# Patient Record
Sex: Female | Born: 1950
Health system: Southern US, Community
[De-identification: ages and names within clinical notes are randomized; demographics above are authoritative.]

## PROBLEM LIST (undated history)

## (undated) DIAGNOSIS — J189 Pneumonia, unspecified organism: Secondary | ICD-10-CM

## (undated) DIAGNOSIS — E876 Hypokalemia: Secondary | ICD-10-CM

## (undated) DIAGNOSIS — I480 Paroxysmal atrial fibrillation: Secondary | ICD-10-CM

## (undated) DIAGNOSIS — R001 Bradycardia, unspecified: Secondary | ICD-10-CM

## (undated) DIAGNOSIS — G4730 Sleep apnea, unspecified: Secondary | ICD-10-CM

## (undated) DIAGNOSIS — I959 Hypotension, unspecified: Secondary | ICD-10-CM

## (undated) DIAGNOSIS — G44009 Cluster headache syndrome, unspecified, not intractable: Secondary | ICD-10-CM

## (undated) DIAGNOSIS — I5022 Chronic systolic (congestive) heart failure: Secondary | ICD-10-CM

## (undated) HISTORY — DX: Paroxysmal atrial fibrillation: I48.0

## (undated) HISTORY — PX: TUBAL LIGATION: SHX77

## (undated) HISTORY — PX: PACEMAKER INSERTION: SHX728

## (undated) HISTORY — DX: Hypokalemia: E87.6

## (undated) HISTORY — PX: TONSILLECTOMY: SUR1361

---

## 2006-01-08 DIAGNOSIS — J189 Pneumonia, unspecified organism: Secondary | ICD-10-CM

## 2006-01-08 HISTORY — DX: Pneumonia, unspecified organism: J18.9

## 2009-12-28 ENCOUNTER — Inpatient Hospital Stay (HOSPITAL_COMMUNITY)
Admission: EM | Admit: 2009-12-28 | Discharge: 2009-12-30 | Payer: Self-pay | Source: Home / Self Care | Attending: Internal Medicine | Admitting: Internal Medicine

## 2009-12-29 ENCOUNTER — Encounter (INDEPENDENT_AMBULATORY_CARE_PROVIDER_SITE_OTHER): Payer: Self-pay | Admitting: Internal Medicine

## 2010-01-03 ENCOUNTER — Ambulatory Visit
Admission: RE | Admit: 2010-01-03 | Payer: Self-pay | Source: Home / Self Care | Attending: Internal Medicine | Admitting: Internal Medicine

## 2010-01-20 DIAGNOSIS — I428 Other cardiomyopathies: Secondary | ICD-10-CM | POA: Insufficient documentation

## 2010-01-20 DIAGNOSIS — I5023 Acute on chronic systolic (congestive) heart failure: Secondary | ICD-10-CM | POA: Insufficient documentation

## 2010-01-20 DIAGNOSIS — I10 Essential (primary) hypertension: Secondary | ICD-10-CM | POA: Insufficient documentation

## 2010-01-20 DIAGNOSIS — E876 Hypokalemia: Secondary | ICD-10-CM | POA: Insufficient documentation

## 2010-01-24 ENCOUNTER — Encounter: Payer: Self-pay | Admitting: Internal Medicine

## 2010-01-24 ENCOUNTER — Ambulatory Visit
Admission: RE | Admit: 2010-01-24 | Discharge: 2010-01-24 | Payer: Self-pay | Source: Home / Self Care | Attending: Internal Medicine | Admitting: Internal Medicine

## 2010-02-15 NOTE — Assessment & Plan Note (Signed)
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 She has a h/o HOCM s/p PPM in the 39s at [facility]. PMHx also notable for HTN and ongoing tobacco use.   Was admitted in December with acute HF. Echo with EF 30-35%, There was also evidence of ASH with septum of 1.7 PM = 1.9. Diuresed and scheduled for outpt cath.  Cath 2 weeks ago: Normal cornaries. EF 40-45%.  Central aortic pressure was 86/47 with a mean of 62.  LV pressure is 74/13 with an EDP of 2.  Right atrial pressure mean of 3.  RV pressure 26/3 with an EDP of 5.  PA pressure 28/5 with a mean of 16.  Pulmonary capillary wedge pressure mean of 7.  Fick cardiac output was 3.4 and cardiac index 2.1. Lasix stopped and spironolactone continued.   Returns for post cath f/u. feels she is getting somewhat better. Able to walk on TM for 30-63min at 1.5-1. however she gets markedly SOB with any incline or steps. Also has squeezing sensation in her chest. No palpitation, syncope or presyncope. Watching salt very closely. Weight now down 13 pounds and stable.   Wearing nicotine patch. Cut cigareetes down to 3/day.   Current Medications (verified): 1)  Aspirin 81 Mg Tbec (Aspirin) .... Take One Tablet By Mouth Daily 2)  Carvedilol 12.5 Mg Tabs (Carvedilol) .... Take One Tablet By Mouth Twice A Day 3)  Lisinopril 10 Mg Tabs (Lisinopril) .... Take One Tablet By Mouth Daily 4)  Spironolactone 25 Mg Tabs (Spironolactone) .... Take 1/2  Tablet By Mouth Daily 5)  Furosemide 40 Mg Tabs (Furosemide) .... Take One Tablet By Mouth Daily.  Allergies (verified): No Known Drug Allergies  Past History:  Past Medical History: Last updated: 01/20/2010 1. hypertrophic obstructive cardiomyopathy 2. Hypokalemia 3. Congestive Heart Failure 4. Hypertension  Vital Signs:  Patient profile:   60 year old  female Height:      62 inches Weight:      139 pounds BMI:     25.52 Pulse rate:   71 / minute BP sitting:   108 / 72  (left arm)  Vitals Entered By: Laurance Flatten CMA (January 24, 2010 12:14 PM)  Physical Exam  General:  Gen: well appearing. no resp difficulty HEENT: normal Neck: supple. no JVD. Carotids 2+ bilat; no bruits. No lymphadenopathy or thryomegaly appreciated. Cor: PMI nondisplaced. Regular rate & rhythm. No rubs, gallops. 2/6 systolic murmur rsb. Lungs: clear Abdomen: soft, nontender, nondistended. No hepatosplenomegaly. No bruits or masses. Good bowel sounds. Extremities: no cyanosis, clubbing, rash, edema Neuro: alert & orientedx3, cranial nerves grossly intact. moves all 4 extremities w/o difficulty. affect pleasant    PPM Specifications Referring MD:  Truman Hayward PPM Vendor:  Medtronic     PPM Model Number:  [id number]     PPM Serial Number:  [id number] H PPM DOI:  09/23/2000     PPM Implanting MD:  NOT IMPLANTED BY [place]  Lead 1    Location: RA     DOI: 07/12/1992     Model #: 4524     Serial #: [id number]     Status: active Lead 2    Location: RV     DOI: 07/12/1992     Model #: 4024     Serial #: [id number]     Status: active  Magnet Response  Rate:  BOL 85 EIR  65  Indications:  HOCM   PPM Follow Up Remote Check?  No Battery Voltage:  2.75 V     Battery Est. Longevity:  2.5 years     Pacer Dependent:  No       PPM Device Measurements Atrium  Amplitude: 2.0 mV, Impedance: 664 ohms, Threshold: 0.5 V at 0.4 msec Right Ventricle  Amplitude: 8.0 mV, Impedance: 552 ohms, Threshold: 1.0 V at 0.4 msec  Episodes MS Episodes:  0     Coumadin:  No Ventricular High Rate:  0     Atrial Pacing:  4.5%     Ventricular Pacing:  99.9%  Parameters Mode:  DDDR     Lower Rate Limit:  55     Upper Rate Limit:  140 Paced AV Delay:  150     Sensed AV Delay:  120 Tech Comments:  New patient to establish.  ROV 6 months. Altha Harm, LPN  January 25, 2010 7:34 AM    Impression & Recommendations:  Problem # 1:  SYSTOLIC HEART FAILURE, ACUTE ON CHRONIC (ICD-428.23) Improving. NYHA II-III. Volume status much improved. Will increase coreg to 18.75 bid. Schedule cpx and echo in 1 month.   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 Does not have any high-risk features for SCD (2600 pvcs on pacer but no NSVT). Thus no inication for icd at this point. Await results of CPX. May consider genetic testing at some point. Continue b-blocker.   Problem # 3:  Tobacco use COunseled on need to quit.  Other Orders: EKG w/ Interpretation (93000)  Patient Instructions: 1)  Your physician recommends that you schedule a follow-up appointment in: 1 month with Dr. Gala Romney 2)  Your physician has recommended you make the following change in your medication: Increase Coreg ( Carvedilol) Prescriptions: CARVEDILOL 12.5 MG TABS (CARVEDILOL) Take one and 1/2 tablets  by mouth twice a day  #90 x 6   Entered by:   Lisabeth Devoid RN   Authorized by:   Dolores Patty, MD, [facility]   Signed by:   Lisabeth Devoid RN on 01/24/2010   Method used:   Electronically to        [facility]* (retail)       [address]       [address] Shipping/mailing       [place], [place]  [address]       Ph: [phone]       Fax: [phone]   RxID:   [id number]

## 2010-02-23 ENCOUNTER — Encounter: Payer: Self-pay | Admitting: Internal Medicine

## 2010-03-01 ENCOUNTER — Emergency Department (HOSPITAL_COMMUNITY): Payer: 59

## 2010-03-01 ENCOUNTER — Emergency Department (HOSPITAL_COMMUNITY)
Admission: EM | Admit: 2010-03-01 | Discharge: 2010-03-01 | Disposition: A | Discharge status: Home / Self Care | Payer: 59 | Attending: Emergency Medicine | Admitting: Emergency Medicine

## 2010-03-01 DIAGNOSIS — R42 Dizziness and giddiness: Secondary | ICD-10-CM | POA: Insufficient documentation

## 2010-03-01 DIAGNOSIS — R5383 Other fatigue: Secondary | ICD-10-CM | POA: Insufficient documentation

## 2010-03-01 DIAGNOSIS — R5381 Other malaise: Secondary | ICD-10-CM | POA: Insufficient documentation

## 2010-03-01 DIAGNOSIS — R61 Generalized hyperhidrosis: Secondary | ICD-10-CM | POA: Insufficient documentation

## 2010-03-01 DIAGNOSIS — J45909 Unspecified asthma, uncomplicated: Secondary | ICD-10-CM | POA: Insufficient documentation

## 2010-03-01 DIAGNOSIS — R109 Unspecified abdominal pain: Secondary | ICD-10-CM | POA: Insufficient documentation

## 2010-03-01 DIAGNOSIS — R110 Nausea: Secondary | ICD-10-CM | POA: Insufficient documentation

## 2010-03-01 DIAGNOSIS — I517 Cardiomegaly: Secondary | ICD-10-CM | POA: Insufficient documentation

## 2010-03-01 DIAGNOSIS — I10 Essential (primary) hypertension: Secondary | ICD-10-CM | POA: Insufficient documentation

## 2010-03-01 LAB — BASIC METABOLIC PANEL
BUN: 19 mg/dL (ref 6–23)
CO2: 24 mEq/L (ref 19–32)
Glucose, Bld: 144 mg/dL — ABNORMAL HIGH (ref 70–99)
Potassium: 4.7 mEq/L (ref 3.5–5.1)
Sodium: 135 mEq/L (ref 135–145)

## 2010-03-01 LAB — CBC
HCT: 35.9 % — ABNORMAL LOW (ref 36.0–46.0)
Hemoglobin: 12.3 g/dL (ref 12.0–15.0)
MCHC: 34.3 g/dL (ref 30.0–36.0)
MCV: 94 fL (ref 78.0–100.0)

## 2010-03-01 LAB — DIFFERENTIAL
Basophils Absolute: 0 10*3/uL (ref 0.0–0.1)
Eosinophils Absolute: 0.2 10*3/uL (ref 0.0–0.7)
Lymphocytes Relative: 12 % (ref 12–46)
Monocytes Relative: 4 % (ref 3–12)
Neutro Abs: 11.4 10*3/uL — ABNORMAL HIGH (ref 1.7–7.7)
Neutrophils Relative %: 83 % — ABNORMAL HIGH (ref 43–77)

## 2010-03-02 ENCOUNTER — Encounter: Payer: Self-pay | Admitting: Internal Medicine

## 2010-03-02 ENCOUNTER — Ambulatory Visit (INDEPENDENT_AMBULATORY_CARE_PROVIDER_SITE_OTHER): Payer: Commercial Managed Care - PPO | Admitting: Internal Medicine

## 2010-03-02 DIAGNOSIS — I5022 Chronic systolic (congestive) heart failure: Secondary | ICD-10-CM

## 2010-03-02 DIAGNOSIS — K5900 Constipation, unspecified: Secondary | ICD-10-CM

## 2010-03-16 ENCOUNTER — Encounter (INDEPENDENT_AMBULATORY_CARE_PROVIDER_SITE_OTHER): Payer: Self-pay | Admitting: *Deleted

## 2010-03-16 NOTE — Assessment & Plan Note (Signed)
Summary: f25m/ dfg   Visit Type:  Follow-up Primary Provider:  Ananias Pilgrim  CC:  no complaints and dizziness when gave blood (yesterday).  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 PMHx also notable for HTN and ongoing tobacco use.   Was admitted in December with acute HF. Echo with EF 30-35%, There was also evidence of ASH with septum of 1.7 PM = 1.9. Diuresed and scheduled for outpt cath.  Cath December 2012: Normal coronaries. EF 40-45%.  Central aortic pressure was 86/47 with a mean of 62.  LV pressure is 74/13 with an EDP of 2.  Right atrial pressure mean of 3.  RV pressure 26/3 with an EDP of 5.  PA pressure 28/5 with a mean of 16.  Pulmonary capillary wedge pressure mean of 7.  Fick cardiac output was 3.4 and cardiac index 2.1. Lasix stopped and spironolactone continued.   Says she is feeling much better - back to her old self. Walking on her treadmill for 30 minutes most days now up to 2.64mph for part of it with some incline. No CP or dyspnea. Walking up hill at work better. No swelling, orthopnea or PND. No palpitations or syncope. BP runs around 100. Gave a pint of blood yeaterday and got hypotensive SBP 80s. Had to go to [facility] ER and get IL NS. K = 4.7 Cr = 0.82  Wearing nicotine patch. Quit smoking Feb 09, 2010  Problems Prior to Update: 1)  Hypertension, Unspecified  (ICD-401.9) 2)  Cardiomyopathy, Constrictive/restrictive  (ICD-425.4) 3)  Systolic Heart Failure, Acute On Chronic  (ICD-428.23) 4)  Hypokalemia  (ICD-276.8)  Current Medications (verified): 1)  Aspirin 81 Mg Tbec (Aspirin) .... Take One Tablet By Mouth Daily 2)  Carvedilol 12.5 Mg Tabs (Carvedilol) .... Take One and 1/2 Tablets  By Mouth Twice A Day 3)  Lisinopril 10 Mg Tabs (Lisinopril) .... Take One Tablet By Mouth Daily 4)  Spironolactone 25 Mg Tabs (Spironolactone) .... Take 1/2   Tablet By Mouth Daily 5)  Furosemide 40 Mg Tabs (Furosemide) .... Take One Tablet By Mouth Daily.  Allergies (verified): No Known Drug Allergies  Past History:  Past Medical History: Last updated: 01/20/2010 1. hypertrophic obstructive cardiomyopathy 2. Hypokalemia 3. Congestive Heart Failure 4. Hypertension  Review of Systems       As per HPI and past medical history; otherwise all systems negative.   Vital Signs:  Patient profile:   60 year old female Height:      62 inches Weight:      135.50 pounds BMI:     24.87 Pulse rate:   70 / minute BP sitting:   96 / 58  (left arm) Cuff size:   regular  Vitals Entered By: Micki Riley CNA (March 02, 2010 3:22 PM)  Physical Exam  General:  well appearing. no resp difficulty HEENT: normal Neck: supple. no JVD. Carotids 2+ bilat; no bruits. No lymphadenopathy or thryomegaly appreciated. Cor: PMI nondisplaced. Regular rate & rhythm. No rubs, gallops. 2/6 systolic murmur rsb. Lungs: clear Abdomen: soft, nontender, nondistended. No hepatosplenomegaly. No bruits or masses. Good bowel sounds. Extremities: no cyanosis, clubbing, rash, edema Neuro: alert & orientedx3, cranial nerves grossly intact. moves all 4 extremities w/o difficulty. affect pleasant    PPM Specifications Referring MD:  Truman Hayward PPM Vendor:  [employer]     PPM Model Number:  303B     PPM Serial Number:  [id number] H PPM DOI:  09/23/2000     PPM Implanting MD:  NOT IMPLANTED BY [place]  Lead 1    Location: RA     DOI: 07/12/1992     Model #: 4524     Serial #: [id number]     Status: active Lead 2    Location: RV     DOI: 07/12/1992     Model #: 7829     Serial #: [id number]     Status: active  Magnet Response Rate:  BOL 85 EIR  65  Indications:  HOCM   PPM Follow Up Pacer Dependent:  No      Episodes Coumadin:  No  Parameters Mode:  DDDR     Lower Rate Limit:  55     Upper Rate Limit:  140 Paced AV Delay:  150     Sensed AV Delay:   120  Impression & Recommendations:  Problem # 1:  SYSTOLIC HEART FAILURE, ACUTE ON CHRONIC (ICD-428.23) Doing well. Functional status much improved.  Given low BP will not make any med adjustments today. Return in 1 month with echo. She realizes not to donate blood any more until we advise otherwise.   Problem # 2:  CONSTIPATION Start colace 100 two times a day.   Other Orders: EKG w/ Interpretation (93000) Echocardiogram (Echo)  Patient Instructions: 1)  Colace 100mg  two times a day  2)  Your physician has requested that you have an echocardiogram.  Echocardiography is a painless test that uses sound waves to create images of your heart. It provides your doctor with information about the size and shape of your heart and how well your heart's chambers and valves are working.  This procedure takes approximately one hour. There are no restrictions for this procedure.  NEEDS IN 1 MONTH 3)  Follow up in 1 month.

## 2010-03-20 LAB — DIFFERENTIAL
Basophils Absolute: 0.1 10*3/uL (ref 0.0–0.1)
Basophils Relative: 1 % (ref 0–1)
Eosinophils Absolute: 0.1 10*3/uL (ref 0.0–0.7)
Eosinophils Absolute: 0.2 10*3/uL (ref 0.0–0.7)
Eosinophils Relative: 2 % (ref 0–5)
Eosinophils Relative: 3 % (ref 0–5)
Lymphocytes Relative: 20 % (ref 12–46)
Lymphocytes Relative: 36 % (ref 12–46)
Lymphocytes Relative: 39 % (ref 12–46)
Lymphs Abs: 1.6 10*3/uL (ref 0.7–4.0)
Monocytes Absolute: 0.4 10*3/uL (ref 0.1–1.0)
Monocytes Absolute: 0.4 10*3/uL (ref 0.1–1.0)
Monocytes Relative: 5 % (ref 3–12)
Monocytes Relative: 6 % (ref 3–12)
Neutro Abs: 3.8 10*3/uL (ref 1.7–7.7)
Neutro Abs: 5.7 10*3/uL (ref 1.7–7.7)
Neutrophils Relative %: 73 % (ref 43–77)

## 2010-03-20 LAB — BRAIN NATRIURETIC PEPTIDE: Pro B Natriuretic peptide (BNP): 646 pg/mL — ABNORMAL HIGH (ref 0.0–100.0)

## 2010-03-20 LAB — CARDIAC PANEL(CRET KIN+CKTOT+MB+TROPI)
CK, MB: 2.7 ng/mL (ref 0.3–4.0)
Relative Index: INVALID (ref 0.0–2.5)
Relative Index: INVALID (ref 0.0–2.5)
Total CK: 64 U/L (ref 7–177)
Total CK: 65 U/L (ref 7–177)
Troponin I: 0.04 ng/mL (ref 0.00–0.06)

## 2010-03-20 LAB — CBC
HCT: 37.4 % (ref 36.0–46.0)
HCT: 40.1 % (ref 36.0–46.0)
Hemoglobin: 12.5 g/dL (ref 12.0–15.0)
MCH: 32.5 pg (ref 26.0–34.0)
MCH: 32.7 pg (ref 26.0–34.0)
MCHC: 33.2 g/dL (ref 30.0–36.0)
MCHC: 33.4 g/dL (ref 30.0–36.0)
MCHC: 33.4 g/dL (ref 30.0–36.0)
MCV: 95.2 fL (ref 78.0–100.0)
Platelets: 164 10*3/uL (ref 150–400)
Platelets: 201 10*3/uL (ref 150–400)
RDW: 13.2 % (ref 11.5–15.5)
RDW: 13.6 % (ref 11.5–15.5)
RDW: 13.6 % (ref 11.5–15.5)
WBC: 5.3 10*3/uL (ref 4.0–10.5)
WBC: 7.9 10*3/uL (ref 4.0–10.5)

## 2010-03-20 LAB — POCT I-STAT, CHEM 8
HCT: 42 % (ref 36.0–46.0)
Hemoglobin: 14.3 g/dL (ref 12.0–15.0)
Potassium: 4 mEq/L (ref 3.5–5.1)
Sodium: 137 mEq/L (ref 135–145)

## 2010-03-20 LAB — BASIC METABOLIC PANEL
BUN: 17 mg/dL (ref 6–23)
BUN: 18 mg/dL (ref 6–23)
Calcium: 8.1 mg/dL — ABNORMAL LOW (ref 8.4–10.5)
Calcium: 8.9 mg/dL (ref 8.4–10.5)
Calcium: 9.1 mg/dL (ref 8.4–10.5)
Creatinine, Ser: 0.82 mg/dL (ref 0.4–1.2)
GFR calc Af Amer: >60 mL/min (ref >60)
GFR calc non Af Amer: 58 mL/min — ABNORMAL LOW (ref >60)
GFR calc non Af Amer: 60 mL/min — ABNORMAL LOW (ref >60)
GFR calc non Af Amer: >60 mL/min (ref >60)
Glucose, Bld: 99 mg/dL (ref 70–99)
Potassium: 4.4 mEq/L (ref 3.5–5.1)
Sodium: 140 mEq/L (ref 135–145)

## 2010-03-20 LAB — URINALYSIS, ROUTINE W REFLEX MICROSCOPIC
Bilirubin Urine: NEGATIVE
Glucose, UA: NEGATIVE mg/dL
Hgb urine dipstick: NEGATIVE
Leukocytes, UA: NEGATIVE
Nitrite: NEGATIVE
pH: 6.5 (ref 5.0–8.0)

## 2010-03-20 LAB — POCT I-STAT 3, ART BLOOD GAS (G3+)
Acid-base deficit: 1 mmol/L (ref 0.0–2.0)
O2 Saturation: 91 %
pCO2 arterial: 36.5 mmHg (ref 35.0–45.0)

## 2010-03-20 LAB — POCT I-STAT 3, VENOUS BLOOD GAS (G3P V)
Acid-base deficit: 2 mmol/L (ref 0.0–2.0)
Bicarbonate: 22.8 mEq/L (ref 20.0–24.0)
Bicarbonate: 23.2 mEq/L (ref 20.0–24.0)
O2 Saturation: 63 %
TCO2: 24 mmol/L (ref 0–100)
pCO2, Ven: 39.5 mmHg — ABNORMAL LOW (ref 45.0–50.0)
pCO2, Ven: 40.4 mmHg — ABNORMAL LOW (ref 45.0–50.0)
pCO2, Ven: 40.7 mmHg — ABNORMAL LOW (ref 45.0–50.0)
pH, Ven: 7.37 — ABNORMAL HIGH (ref 7.250–7.300)
pO2, Ven: 31 mmHg (ref 30.0–45.0)
pO2, Ven: 34 mmHg (ref 30.0–45.0)

## 2010-03-20 LAB — LIPID PANEL
LDL Cholesterol: 115 mg/dL — ABNORMAL HIGH (ref 0–99)
Total CHOL/HDL Ratio: 3.2 RATIO
VLDL: 16 mg/dL (ref 0–40)
VLDL: 16 mg/dL (ref 0–40)

## 2010-03-20 LAB — CK TOTAL AND CKMB (NOT AT ARMC): CK, MB: 3 ng/mL (ref 0.3–4.0)

## 2010-03-20 LAB — T4, FREE: Free T4: 1.24 ng/dL (ref 0.80–1.80)

## 2010-03-20 LAB — TROPONIN I: Troponin I: 0.03 ng/mL (ref 0.00–0.06)

## 2010-03-20 LAB — POCT CARDIAC MARKERS
CKMB, poc: 1.1 ng/mL (ref 1.0–8.0)
Myoglobin, poc: 36.4 ng/mL (ref 12–200)
Troponin i, poc: <0.05 ng/mL (ref 0.00–0.09)

## 2010-03-20 LAB — D-DIMER, QUANTITATIVE: D-Dimer, Quant: 1.24 ug/mL-FEU — ABNORMAL HIGH (ref 0.00–0.48)

## 2010-03-20 LAB — PROTIME-INR: Prothrombin Time: 12.7 seconds (ref 11.6–15.2)

## 2010-03-20 LAB — URINE MICROSCOPIC-ADD ON

## 2010-03-20 LAB — URINE CULTURE
Colony Count: 60000
Culture  Setup Time: 201112212128

## 2010-03-20 LAB — MAGNESIUM: Magnesium: 1.9 mg/dL (ref 1.5–2.5)

## 2010-03-21 NOTE — Letter (Signed)
Summary: Patient Visit/Medication Summary  Patient Visit/Medication Summary   Imported By: Marylou Mccoy 03/15/2010 12:01:07  _____________________________________________________________________  External Attachment:    Type:   Image     Comment:   External Document

## 2010-03-21 NOTE — Letter (Signed)
Summary: Appointment - Reschedule  [facility], Main Office  [address]   [place], [place] [address]   Phone: [phone]  Fax: [phone]     March 16, 2010 MRN: [id number]   ARLY SALMINEN [address] [place], [place]  [address]   Dear Ms. Ponce,   Due to a change in our office schedule, your appointment on March 29,2012 at 3:00 must be changed.  It is very important that we reach you to reschedule this appointment. We look forward to participating in your health care needs. Please contact us at the number listed above at your earliest convenience to reschedule this appointment.     Sincerely, Control and instrumentation engineer

## 2010-03-27 ENCOUNTER — Encounter: Payer: Self-pay | Admitting: Internal Medicine

## 2010-04-04 ENCOUNTER — Other Ambulatory Visit (HOSPITAL_COMMUNITY): Payer: Self-pay | Admitting: Internal Medicine

## 2010-04-04 DIAGNOSIS — I421 Obstructive hypertrophic cardiomyopathy: Secondary | ICD-10-CM

## 2010-04-04 DIAGNOSIS — I509 Heart failure, unspecified: Secondary | ICD-10-CM

## 2010-04-06 ENCOUNTER — Ambulatory Visit (INDEPENDENT_AMBULATORY_CARE_PROVIDER_SITE_OTHER): Payer: 59 | Admitting: Internal Medicine

## 2010-04-06 ENCOUNTER — Encounter: Payer: Self-pay | Admitting: Internal Medicine

## 2010-04-06 ENCOUNTER — Ambulatory Visit (HOSPITAL_COMMUNITY): Payer: 59 | Attending: Internal Medicine | Admitting: Radiology

## 2010-04-06 VITALS — BP 102/62 | HR 64 | Wt 130.8 lb

## 2010-04-06 DIAGNOSIS — I428 Other cardiomyopathies: Secondary | ICD-10-CM | POA: Insufficient documentation

## 2010-04-06 DIAGNOSIS — I509 Heart failure, unspecified: Secondary | ICD-10-CM

## 2010-04-06 DIAGNOSIS — F17200 Nicotine dependence, unspecified, uncomplicated: Secondary | ICD-10-CM

## 2010-04-06 DIAGNOSIS — I421 Obstructive hypertrophic cardiomyopathy: Secondary | ICD-10-CM

## 2010-04-06 DIAGNOSIS — I5022 Chronic systolic (congestive) heart failure: Secondary | ICD-10-CM

## 2010-04-06 DIAGNOSIS — Z720 Tobacco use: Secondary | ICD-10-CM | POA: Insufficient documentation

## 2010-04-06 NOTE — Assessment & Plan Note (Signed)
EF essentially normalized. NYHA II. BP borderline low. Will continue current regimen. May need to cut back a bit if BP gets any lower.

## 2010-04-06 NOTE — Progress Notes (Signed)
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 PMHx also notable for HTN and ongoing tobacco use.   Was admitted in December with acute HF. Echo with EF 30-35%, There was also evidence of ASH with septum of 1.7 PM = 1.9. Diuresed and scheduled for outpt cath.  Cath 12/11: Normal coronaries. EF 40-45%.  Central aortic pressure was 86/47 with a mean of 62.  LV pressure is 74/13 with an EDP of 2.  Right atrial pressure mean of 3.  RV pressure 26/3 with an EDP of 5.  PA pressure 28/5 with a mean of 16.  Pulmonary capillary wedge pressure mean of 7.  Fick cardiac output was 3.4 and cardiac index 2.1. Lasix stopped and spironolactone continued.   Says she is feeling much better - back to her old self. Walking on her treadmill for 30 minutes most days now up to 2.46mph for part of it with some incline. No CP or dyspnea. No swelling, orthopnea or PND. No palpitations or syncope. Taking BP at home, SBP  around 90 (occasionally 80s). Not dizzy or passing out. Not needing to use lasix.   Wearing nicotine patch. Quit cigarettes February 2012  Echo today (which I reviewed personally) EF 50-55%. Grade 2 diastolic dysfunction. IVS 1.4cm.    ROS: All systems negative except as listed in HPI, PMH and Problem List.  Past Medical History  Diagnosis Date  . Hypertrophic obstructive cardiomyopathy   . Hypokalemia   . CHF (congestive heart failure)   . HTN (hypertension)     Current Outpatient Prescriptions  Medication Sig Dispense Refill  . aspirin 81 MG tablet Take 81 mg by mouth daily.        . carvedilol (COREG) 12.5 MG tablet Take 18.25 mg by mouth 2 (two) times daily with a meal.       . docusate sodium (COLACE) 100 MG capsule Take 100 mg by mouth 2 (two) times daily. As needed      . furosemide (LASIX) 40 MG tablet One pill as needed      . lisinopril (PRINIVIL,ZESTRIL) 10 MG tablet Take 10 mg by mouth  daily.        Marland Kitchen spironolactone (ALDACTONE) 25 MG tablet Take 12.5 mg by mouth daily.           PHYSICAL EXAM: Filed Vitals:   04/06/10 1449  BP: 114/72  Pulse: 64   General:  Well appearing. No resp difficulty HEENT: normal Neck: supple. JVP flat. Carotids 2+ bilaterally; no bruits. No lymphadenopathy or thryomegaly appreciated. Cor: PMI normal. Regular rate & rhythm. No rubs, gallops or murmurs. Lungs: clear with decreased air movement throughout Abdomen: soft, nontender, nondistended. No hepatosplenomegaly. No bruits or masses. Good bowel sounds. Extremities: no cyanosis, clubbing, rash, edema Neuro: alert & orientedx3, cranial nerves grossly intact. Moves all 4 extremities w/o difficulty. Affect pleasant.      ASSESSMENT & PLAN:

## 2010-04-06 NOTE — Assessment & Plan Note (Signed)
I congratulate her on quitting.

## 2010-04-06 NOTE — Patient Instructions (Signed)
Your physician recommends that you schedule a follow-up appointment in: 3 months.  

## 2010-05-01 ENCOUNTER — Other Ambulatory Visit: Payer: Self-pay | Admitting: *Deleted

## 2010-05-04 ENCOUNTER — Telehealth: Payer: Self-pay | Admitting: Internal Medicine

## 2010-05-04 MED ORDER — LISINOPRIL 10 MG PO TABS: 10.0000 mg | ORAL_TABLET | Freq: Every day | ORAL | Status: DC

## 2010-05-04 NOTE — Telephone Encounter (Signed)
Refill meds- lisinopril 10 mg. [facility] [phone]

## 2010-06-08 ENCOUNTER — Ambulatory Visit (INDEPENDENT_AMBULATORY_CARE_PROVIDER_SITE_OTHER): Payer: 59 | Admitting: Internal Medicine

## 2010-06-08 ENCOUNTER — Encounter: Payer: Self-pay | Admitting: Internal Medicine

## 2010-06-08 VITALS — BP 100/60 | HR 75 | Resp 18 | Ht 62.0 in | Wt 127.0 lb

## 2010-06-08 DIAGNOSIS — I5022 Chronic systolic (congestive) heart failure: Secondary | ICD-10-CM

## 2010-06-08 DIAGNOSIS — I509 Heart failure, unspecified: Secondary | ICD-10-CM

## 2010-06-08 LAB — BASIC METABOLIC PANEL
CO2: 27 mEq/L (ref 19–32)
Calcium: 9.6 mg/dL (ref 8.4–10.5)
Chloride: 102 mEq/L (ref 96–112)
Glucose, Bld: 99 mg/dL (ref 70–99)
Sodium: 136 mEq/L (ref 135–145)

## 2010-06-08 NOTE — Assessment & Plan Note (Signed)
Doing well. NYHA I. Volume looks great. BP soft but tolerating well. Will continue current regimen. Check labs today. Will arrange for her to be seen in pacer clinic for device f/u.

## 2010-06-08 NOTE — Progress Notes (Signed)
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 PMHx also notable for HTN and ongoing tobacco use.   Was admitted in December with acute HF. Echo with EF 30-35%, There was also evidence of ASH with septum of 1.7 PW = 1.9. Diuresed and scheduled for outpt cath.  Cath 12/11: Normal coronaries. EF 40-45%.  Central aortic pressure was 86/47 with a mean of 62.  LV pressure is 74/13 with an EDP of 2.  Right atrial pressure mean of 3.  RV pressure 26/3 with an EDP of 5.  PA pressure 28/5 with a mean of 16.  Pulmonary capillary wedge pressure mean of 7.  Fick cardiac output was 3.4 and cardiac index 2.1. Lasix stopped and spironolactone continued.   Wearing nicotine patch. Quit cigarettes February 2012  Echo in March:  EF 50-55%. Grade 2 diastolic dysfunction. IVS 1.4cm.   Says she is feeling disgustingly great. Walking on her treadmill for 40-60 minutes most days now up to 2.0 mph for part of it with some incline. Continues to lose weight. No CP or dyspnea. No swelling, orthopnea or PND. No palpitations or syncope. Taking BP at home, SBP  At home 80-90. Not dizzy or passing out. Only takes lasix when weight goes up.      ROS: All systems negative except as listed in HPI, PMH and Problem List.  Past Medical History  Diagnosis Date  . Hypertrophic obstructive cardiomyopathy   . Hypokalemia   . CHF (congestive heart failure)   . HTN (hypertension)     Current Outpatient Prescriptions  Medication Sig Dispense Refill  . aspirin 81 MG tablet 1 tab about every 3 days      . carvedilol (COREG) 12.5 MG tablet Take 18.25 mg by mouth 2 (two) times daily with a meal.       . docusate sodium (COLACE) 100 MG capsule Take 100 mg by mouth 2 (two) times daily. As needed      . furosemide (LASIX) 40 MG tablet One pill as needed      . lisinopril (PRINIVIL,ZESTRIL) 10 MG tablet Take 1 tablet (10 mg total) by mouth  daily.  90 tablet  2  . spironolactone (ALDACTONE) 25 MG tablet Take 12.5 mg by mouth daily.           PHYSICAL EXAM: Filed Vitals:   06/08/10 1416  BP: 100/60  Pulse: 75  Resp: 18   General:  Well appearing. No resp difficulty HEENT: normal Neck: supple. JVP flat. Carotids 2+ bilaterally; no bruits. No lymphadenopathy or thryomegaly appreciated. Cor: PMI normal. Regular rate & rhythm. No rubs, gallops or murmurs. Lungs: clear with decreased air movement throughout Abdomen: soft, nontender, nondistended. No hepatosplenomegaly. No bruits or masses. Good bowel sounds. Extremities: no cyanosis, clubbing, rash, edema Neuro: alert & orientedx3, cranial nerves grossly intact. Moves all 4 extremities w/o difficulty. Affect pleasant.      ASSESSMENT & PLAN:

## 2010-06-08 NOTE — Patient Instructions (Addendum)
Labs today (bmet, bnp) Your physician recommends that you schedule a follow-up appointment in: device clinic in 1 month Your physician recommends that you schedule a follow-up appointment in: 2 months with Dr Gala Romney.

## 2010-07-06 ENCOUNTER — Encounter: Payer: 59 | Admitting: *Deleted

## 2010-08-04 ENCOUNTER — Other Ambulatory Visit: Payer: Self-pay | Admitting: *Deleted

## 2010-08-04 MED ORDER — SPIRONOLACTONE 25 MG PO TABS: 12.5000 mg | ORAL_TABLET | Freq: Every day | ORAL | Status: DC

## 2010-08-09 ENCOUNTER — Other Ambulatory Visit: Payer: Self-pay | Admitting: *Deleted

## 2010-08-09 ENCOUNTER — Encounter: Payer: 59 | Admitting: *Deleted

## 2010-08-09 ENCOUNTER — Ambulatory Visit: Payer: 59 | Admitting: Internal Medicine

## 2010-08-09 MED ORDER — FUROSEMIDE 40 MG PO TABS: 40.0000 mg | ORAL_TABLET | ORAL | Status: DC

## 2010-08-10 ENCOUNTER — Other Ambulatory Visit: Payer: Self-pay

## 2010-08-10 ENCOUNTER — Ambulatory Visit: Payer: 59 | Admitting: Internal Medicine

## 2010-08-10 ENCOUNTER — Ambulatory Visit (INDEPENDENT_AMBULATORY_CARE_PROVIDER_SITE_OTHER): Payer: 59 | Admitting: *Deleted

## 2010-08-10 DIAGNOSIS — I509 Heart failure, unspecified: Secondary | ICD-10-CM

## 2010-08-10 DIAGNOSIS — I5022 Chronic systolic (congestive) heart failure: Secondary | ICD-10-CM

## 2010-08-10 LAB — PACEMAKER DEVICE OBSERVATION
AL IMPEDENCE PM: 677 Ohm
AL THRESHOLD: 1 V
RV LEAD AMPLITUDE: 11.2 mv
RV LEAD IMPEDENCE PM: 570 Ohm

## 2010-08-10 NOTE — Progress Notes (Signed)
Pacer check

## 2010-08-24 ENCOUNTER — Ambulatory Visit (HOSPITAL_COMMUNITY): Payer: 59

## 2010-08-28 ENCOUNTER — Emergency Department (HOSPITAL_COMMUNITY): Payer: 59

## 2010-08-28 ENCOUNTER — Emergency Department (HOSPITAL_COMMUNITY)
Admission: EM | Admit: 2010-08-28 | Discharge: 2010-08-29 | Disposition: A | Discharge status: Home / Self Care | Payer: 59 | Attending: Emergency Medicine | Admitting: Emergency Medicine

## 2010-08-28 DIAGNOSIS — Z950 Presence of cardiac pacemaker: Secondary | ICD-10-CM | POA: Insufficient documentation

## 2010-08-28 DIAGNOSIS — I428 Other cardiomyopathies: Secondary | ICD-10-CM | POA: Insufficient documentation

## 2010-08-28 DIAGNOSIS — I509 Heart failure, unspecified: Secondary | ICD-10-CM | POA: Insufficient documentation

## 2010-08-28 DIAGNOSIS — R0602 Shortness of breath: Secondary | ICD-10-CM | POA: Insufficient documentation

## 2010-08-28 DIAGNOSIS — J9801 Acute bronchospasm: Secondary | ICD-10-CM | POA: Insufficient documentation

## 2010-08-28 LAB — COMPREHENSIVE METABOLIC PANEL
AST: 17 U/L (ref 0–37)
Albumin: 4 g/dL (ref 3.5–5.2)
BUN: 14 mg/dL (ref 6–23)
CO2: 23 mEq/L (ref 19–32)
Calcium: 9.6 mg/dL (ref 8.4–10.5)
Chloride: 102 mEq/L (ref 96–112)
Creatinine, Ser: 0.77 mg/dL (ref 0.50–1.10)
GFR calc non Af Amer: >60 mL/min (ref >60)
Total Bilirubin: 0.3 mg/dL (ref 0.3–1.2)

## 2010-08-28 LAB — DIFFERENTIAL
Eosinophils Absolute: 0.2 10*3/uL (ref 0.0–0.7)
Eosinophils Relative: 3 % (ref 0–5)
Lymphocytes Relative: 19 % (ref 12–46)
Lymphs Abs: 1.4 10*3/uL (ref 0.7–4.0)
Monocytes Relative: 9 % (ref 3–12)
Neutrophils Relative %: 69 % (ref 43–77)

## 2010-08-28 LAB — CBC
HCT: 37.1 % (ref 36.0–46.0)
MCH: 33.3 pg (ref 26.0–34.0)
MCV: 94.4 fL (ref 78.0–100.0)
Platelets: 184 10*3/uL (ref 150–400)
RBC: 3.93 MIL/uL (ref 3.87–5.11)

## 2010-08-28 LAB — PRO B NATRIURETIC PEPTIDE: Pro B Natriuretic peptide (BNP): 1546 pg/mL — ABNORMAL HIGH (ref 0–125)

## 2010-08-29 ENCOUNTER — Telehealth: Payer: Self-pay | Admitting: Internal Medicine

## 2010-08-29 NOTE — Telephone Encounter (Signed)
Per pt call, pt was recently in the ED and pt was given breathing treatments while there. Pt wanted to know if Dr. Gala Romney can write pt a RX for breathing treatments. Please return pt call to advise/discuss.

## 2010-08-30 ENCOUNTER — Encounter (HOSPITAL_COMMUNITY): Payer: Self-pay

## 2010-08-30 ENCOUNTER — Ambulatory Visit (HOSPITAL_COMMUNITY)
Admission: RE | Admit: 2010-08-30 | Discharge: 2010-08-30 | Disposition: A | Discharge status: Home / Self Care | Payer: 59 | Source: Ambulatory Visit | Attending: Internal Medicine | Admitting: Internal Medicine

## 2010-08-30 DIAGNOSIS — R0602 Shortness of breath: Secondary | ICD-10-CM | POA: Insufficient documentation

## 2010-08-30 DIAGNOSIS — R0600 Dyspnea, unspecified: Secondary | ICD-10-CM

## 2010-08-30 DIAGNOSIS — I509 Heart failure, unspecified: Secondary | ICD-10-CM

## 2010-08-30 DIAGNOSIS — I5022 Chronic systolic (congestive) heart failure: Secondary | ICD-10-CM | POA: Insufficient documentation

## 2010-08-30 DIAGNOSIS — R0989 Other specified symptoms and signs involving the circulatory and respiratory systems: Secondary | ICD-10-CM

## 2010-08-30 DIAGNOSIS — R0609 Other forms of dyspnea: Secondary | ICD-10-CM

## 2010-08-30 DIAGNOSIS — I519 Heart disease, unspecified: Secondary | ICD-10-CM

## 2010-08-30 DIAGNOSIS — J449 Chronic obstructive pulmonary disease, unspecified: Secondary | ICD-10-CM | POA: Insufficient documentation

## 2010-08-30 DIAGNOSIS — I428 Other cardiomyopathies: Secondary | ICD-10-CM

## 2010-08-30 DIAGNOSIS — I5023 Acute on chronic systolic (congestive) heart failure: Secondary | ICD-10-CM

## 2010-08-30 LAB — CK TOTAL AND CKMB (NOT AT ARMC)
CK, MB: 4.3 ng/mL — ABNORMAL HIGH (ref 0.3–4.0)
Relative Index: 3.8 — ABNORMAL HIGH (ref 0.0–2.5)
Total CK: 113 U/L (ref 7–177)

## 2010-08-30 LAB — CBC
Hemoglobin: 14.2 g/dL (ref 12.0–15.0)
Platelets: 225 10*3/uL (ref 150–400)
RBC: 4.21 MIL/uL (ref 3.87–5.11)
WBC: 9.2 10*3/uL (ref 4.0–10.5)

## 2010-08-30 LAB — BASIC METABOLIC PANEL
Calcium: 10.1 mg/dL (ref 8.4–10.5)
GFR calc non Af Amer: 49 mL/min — ABNORMAL LOW (ref >60)
Potassium: 4.1 mEq/L (ref 3.5–5.1)
Sodium: 134 mEq/L — ABNORMAL LOW (ref 135–145)

## 2010-08-30 LAB — TROPONIN I: Troponin I: <0.3 ng/mL (ref <0.30)

## 2010-08-30 LAB — D-DIMER, QUANTITATIVE: D-Dimer, Quant: 0.5 ug/mL-FEU — ABNORMAL HIGH (ref 0.00–0.48)

## 2010-08-30 NOTE — Assessment & Plan Note (Addendum)
I am unsure exact cause of SOB.  Work-up in ED several days ago c/w mild volume overload but now appears better with lasix. However, dyspnea persists. Will obtain CBC, BMET,  D-Dimer, and cardiac panel. Will schedule Echo.She is also instructed to take lasix as needed and use Albuterol inhaler prior to exercise.  She instructed to call heart failure clinic if symptoms worsening. Follow  Up next week in heart failure clinic.

## 2010-08-30 NOTE — Telephone Encounter (Signed)
Per pt call. Pt is having difficulty breathing and having chest pain. Pt said she went to the ED on Monday and was told some signs CHF were present and hospital also found stuff in pt lungs. Pt wanted to know if she could be seen today. Pt is also c/o sob. Pt call was transferred to [facility]

## 2010-08-30 NOTE — Patient Instructions (Signed)
Labs today  Your physician has requested that you have an echocardiogram. Echocardiography is a painless test that uses sound waves to create images of your heart. It provides your doctor with information about the size and shape of your heart and how well your heart's chambers and valves are working. This procedure takes approximately one hour. There are no restrictions for this procedure.  Your physician has recommended that you have a pulmonary function test. Pulmonary Function Tests are a group of tests that measure how well air moves in and out of your lungs.  Your physician recommends that you schedule a follow-up appointment in: 1 week

## 2010-08-30 NOTE — Telephone Encounter (Signed)
Spoke with patient. She states that she was seen in the ER Monday night and was treated for heart failure. She still feels poorly and is not responding well to increased lasix dose. I called the heart failure clinic and spoke with [facility]. She plans to speak with Dr.Bensimhon to work her into the schedule today. I called patient back who is at work at [employer] today and advised her that Alvis Lemmings will call her back with an appointment.

## 2010-08-30 NOTE — Progress Notes (Signed)
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 PMHx also notable for HTN and ongoing tobacco use. Follow up today after ED evaluation for SOB and chest tightness. Pro BNP was 1546  (Jun 08, 2010  150) Potassium 4.0, BUN  14 and Creatinine 0.77, tropinin 0.0.  She was given 60 mg IV Lasix and Albuterol /Atrovent nebulizer . She was provided a prescription for Lasix 60 mg po daily. She has had no improvement and continues to be SOB on exertion.   Was admitted in December 2011 with acute HF. Echo with EF 30-35%, There was also evidence of ASH with septum of 1.7 PW = 1.9. Diuresed and scheduled for outpt cath.  Cath 12/11: Normal coronaries. EF 40-45%.  Central aortic pressure was 86/47 with a mean of 62.  LV pressure is 74/13 with an EDP of 2.  Right atrial pressure mean of 3.  RV pressure 26/3 with an EDP of 5.  PA pressure 28/5 with a mean of 16.  Pulmonary capillary wedge pressure mean of 7.  Fick cardiac output was 3.4 and cardiac index 2.1.   Wearing nicotine patch. Quit cigarettes February 2012  Echo in March:  EF 50-55%. Grade 2 diastolic dysfunction. IVS 1.4cm.    On Sunday she noticed she was having difficulty walking on treadmill and she became SOB. She decreased the speed but continued to walk for one hour. She feels tightness in her chest on exertion. Difficulty talking and taking a deep breath. Increased the amount of pillows she is sleeping on 3-4pillows. Weight at home has been 124-129. When her weight increases to 129 she exercises . She does not take prn Lasix.  Over the last 17 days her abdominal girth has increased.  She follows low sodium and low fat diet.  No swelling.  No palpitations or syncope. Taking BP at home, SBP  At home 80-90s.  Not dizzy or passing out.  No fevers no chills. Productive cough with clear sputum.    ROS: All systems negative except as listed in HPI, PMH and  Problem List.  Past Medical History  Diagnosis Date  . Hypertrophic obstructive cardiomyopathy   . Hypokalemia   . CHF (congestive heart failure)   . HTN (hypertension)     Current Outpatient Prescriptions  Medication Sig Dispense Refill  . aspirin 81 MG tablet 1 tab about every 3 days      . carvedilol (COREG) 12.5 MG tablet Take 18.25 mg by mouth 2 (two) times daily with a meal.       . docusate sodium (COLACE) 100 MG capsule Take 100 mg by mouth 2 (two) times daily. As needed      . furosemide (LASIX) 40 MG tablet Take 1 tablet (40 mg total) by mouth as directed. One pill as needed  90 tablet  1  . lisinopril (PRINIVIL,ZESTRIL) 10 MG tablet Take 1 tablet (10 mg total) by mouth daily.  90 tablet  2  . spironolactone (ALDACTONE) 25 MG tablet Take 0.5 tablets (12.5 mg total) by mouth daily.  15 tablet  3     PHYSICAL EXAM: Filed Vitals:   08/30/10 1348  BP: 139/83  Pulse: 88  Resp: 18   General: Anxious appearing   HEENT: normal Neck: supple. JVP flat. Carotids 2+ bilaterally; no bruits. No lymphadenopathy or thryomegaly appreciated. Cor: PMI normal. Regular rate & rhythm. No rubs,  gallops or murmurs. Lungs: RML, RLL and LLL Ex wheezes Abdomen: soft, nontender, nondistended. No hepatosplenomegaly. No bruits or masses. Good bowel sounds. Extremities: no cyanosis, clubbing, rash, edema Neuro: alert & orientedx3, cranial nerves grossly intact. Moves all 4 extremities w/o difficulty. Affect pleasant.   EKG: V paced 78   ASSESSMENT & PLAN:

## 2010-08-30 NOTE — Assessment & Plan Note (Signed)
See above

## 2010-08-31 ENCOUNTER — Ambulatory Visit (HOSPITAL_COMMUNITY)
Admission: RE | Admit: 2010-08-31 | Discharge: 2010-08-31 | Disposition: A | Discharge status: Home / Self Care | Payer: 59 | Source: Ambulatory Visit | Attending: Internal Medicine | Admitting: Internal Medicine

## 2010-08-31 ENCOUNTER — Telehealth: Payer: Self-pay | Admitting: Critical Care Medicine

## 2010-08-31 DIAGNOSIS — R0602 Shortness of breath: Secondary | ICD-10-CM | POA: Insufficient documentation

## 2010-08-31 LAB — PULMONARY FUNCTION TEST

## 2010-08-31 NOTE — Telephone Encounter (Signed)
Called spoke with answering service.  Office closed at 5pm -- will call back tomorrow am.

## 2010-09-01 ENCOUNTER — Ambulatory Visit (INDEPENDENT_AMBULATORY_CARE_PROVIDER_SITE_OTHER): Payer: 59 | Admitting: Emergency Medicine

## 2010-09-01 ENCOUNTER — Encounter: Payer: Self-pay | Admitting: Emergency Medicine

## 2010-09-01 ENCOUNTER — Telehealth: Payer: Self-pay | Admitting: Critical Care Medicine

## 2010-09-01 VITALS — BP 110/72 | HR 77 | Temp 97.7°F | Ht 62.0 in | Wt 124.4 lb

## 2010-09-01 DIAGNOSIS — R0609 Other forms of dyspnea: Secondary | ICD-10-CM

## 2010-09-01 DIAGNOSIS — R0600 Dyspnea, unspecified: Secondary | ICD-10-CM

## 2010-09-01 MED ORDER — LORATADINE 10 MG PO TABS: 10.0000 mg | ORAL_TABLET | Freq: Every day | ORAL | Status: DC

## 2010-09-01 NOTE — Patient Instructions (Signed)
Please start taking loratadine 10mg  daily until next visit Start nasonex 2 sprays each nostril daily We will perform full pulmonary function testing at your next office visit Follow with Dr Delton Coombes next available

## 2010-09-01 NOTE — Assessment & Plan Note (Addendum)
Angela Shepherd with AFL, BD not tested but suspect this is COPD. FEV1 severely reduced but the test was done when she felt sick. Only new exposure is wood dust - she feels she can't breathe through her nose. May be some benefit to treating allergies. No wheeze today so I do not believe this is an exacerbation. She will probably need maintenance meds for COPD in the end - full PFT - confirm technique with prn SABA - start allergy regimen - loratadine and nasonex - rov next available

## 2010-09-01 NOTE — Telephone Encounter (Addendum)
Called, spoke with [name].  Dr. Gala Romney is requesting an appt for today with either PW or RB for breathing tests done yesterday.  PW is out of office today - RB here this evening.  OK per TD to book pt at 1:30 pm today with RB -- pt will need to arrive at 1:15.  [name] aware of this appt and will inform pt.

## 2010-09-01 NOTE — Telephone Encounter (Signed)
[name] w/ Dr. Prescott Gum office is returning Angela Shepherd's call.  [name] stated they are requesting an appointment with Dr. Delford Field today.  Please call at [phone].  Antionette Fairy

## 2010-09-01 NOTE — Progress Notes (Signed)
Subjective:    Patient ID: Angela Shepherd, female    DOB: [birth year], 60 y.o.   MRN: [id number]  HPI 60 yo former smoker, hx HOCM and pacer placement, HTN with both systolic and diastolic dysfxn on TTE from 3/12. Followed by Dr Gala Romney.  Quit smoking in 2/12. She has been doing fairly well, is able to walk on treadmill. Noticed over the weekend that she was having SOB with activity that usually was tolerated. She is currently having her house remodeled, is being exposed to a lot of sawdust. Experiencing wheeze x 2 weeks, cough that minimally productive. She has chronic nasal congestion and drainage, no current sore throat.  Over the weekend she was SOB with both exertion and at rest, breathing through her mouth. Went to ED and received alb/atrovent, didn't really improve her breathing but made her jittery. She is not on any allergy regimen. She is on lisinopril.   Review of Systems Has lost wt 30lb since January  Past Medical History  Diagnosis Date  . Hypertrophic obstructive cardiomyopathy   . Hypokalemia   . CHF (congestive heart failure)   . HTN (hypertension)      Family History  Problem Relation Age of Onset  . Diabetes    . Cardiomyopathy       History   Social History  . Marital Status: Married    Spouse Name: N/A    Number of Children: 2  . Years of Education: N/A   Occupational History  . [employer]    Social History Main Topics  . Smoking status: Former Smoker -- 0.3 packs/day for 10 years    Types: Cigarettes    Quit date: 02/21/2010  . Smokeless tobacco: Never Used  . Alcohol Use: No  . Drug Use: No  . Sexually Active: Not on file   Other Topics Concern  . Not on file   Social History Narrative  . No narrative on file     No Known Allergies   Outpatient Prescriptions Prior to Visit  Medication Sig Dispense Refill  . aspirin 81 MG tablet 1 tab about every 3 days      . carvedilol (COREG) 12.5 MG tablet Take 18.25 mg by mouth 2  (two) times daily with a meal.       . docusate sodium (COLACE) 100 MG capsule Take 100 mg by mouth 2 (two) times daily. As needed      . lisinopril (PRINIVIL,ZESTRIL) 10 MG tablet Take 1 tablet (10 mg total) by mouth daily.  90 tablet  2  . spironolactone (ALDACTONE) 25 MG tablet Take 0.5 tablets (12.5 mg total) by mouth daily.  15 tablet  3  . furosemide (LASIX) 40 MG tablet Take 1 tablet (40 mg total) by mouth as directed. One pill as needed  90 tablet  1        Objective:   Physical Exam  Gen: Pleasant, well-nourished, in no distress,  normal affect  ENT: No lesions,  mouth clear,  oropharynx clear, no postnasal drip  Neck: No JVD, no TMG, no carotid bruits  Lungs: No use of accessory muscles, no dullness to percussion, clear without rales or rhonchi  Cardiovascular: RRR, heart sounds normal, no murmur or gallops, no peripheral edema  Musculoskeletal: No deformities, no cyanosis or clubbing  Neuro: alert, non focal  Skin: Warm, no lesions or rashes     Assessment & Plan:  Dyspnea Spiro with AFL, BD not tested but suspect this is COPD. FEV1  severely reduced but the test was done when she felt sick. Only new exposure is wood dust - she feels she can't breathe through her nose. May be some benefit to treating allergies. No wheeze today so I do not believe this is an exacerbation. She will probably need maintenance meds for COPD in the end - full PFT - confirm technique with prn SABA - start allergy regimen - loratadine and nasonex - rov next available

## 2010-09-01 NOTE — Telephone Encounter (Signed)
Dup message error °

## 2010-09-01 NOTE — Telephone Encounter (Signed)
[name] calling back about appointment.  She states she did not hear anything from [place] yesterday and is requesting a call back ASAP @ [phone].

## 2010-09-05 ENCOUNTER — Ambulatory Visit (HOSPITAL_COMMUNITY): Payer: 59

## 2010-09-07 ENCOUNTER — Encounter: Payer: Self-pay | Admitting: Internal Medicine

## 2010-09-28 ENCOUNTER — Ambulatory Visit (HOSPITAL_COMMUNITY): Payer: 59

## 2010-09-29 ENCOUNTER — Ambulatory Visit (INDEPENDENT_AMBULATORY_CARE_PROVIDER_SITE_OTHER): Payer: 59 | Admitting: Emergency Medicine

## 2010-09-29 ENCOUNTER — Encounter: Payer: Self-pay | Admitting: Emergency Medicine

## 2010-09-29 DIAGNOSIS — R0600 Dyspnea, unspecified: Secondary | ICD-10-CM

## 2010-09-29 DIAGNOSIS — J449 Chronic obstructive pulmonary disease, unspecified: Secondary | ICD-10-CM

## 2010-09-29 DIAGNOSIS — J309 Allergic rhinitis, unspecified: Secondary | ICD-10-CM

## 2010-09-29 DIAGNOSIS — R0609 Other forms of dyspnea: Secondary | ICD-10-CM

## 2010-09-29 LAB — PULMONARY FUNCTION TEST

## 2010-09-29 NOTE — Assessment & Plan Note (Signed)
-   add daily NSW to loratadine and Nasonex

## 2010-09-29 NOTE — Assessment & Plan Note (Signed)
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 Clinically asytmptomatic - continue SABA prn.

## 2010-09-29 NOTE — Progress Notes (Signed)
  Subjective:    Patient ID: Angela Shepherd, female    DOB: [birth year], 60 y.o.   MRN: [id number]  HPI 60 yo former smoker, hx HOCM and pacer placement, HTN with both systolic and diastolic dysfxn on TTE from 3/12. Followed by Dr Gala Romney.  Quit smoking in 2/12. She has been doing fairly well, is able to walk on treadmill. Noticed over the weekend that she was having SOB with activity that usually was tolerated. She is currently having her house remodeled, is being exposed to a lot of sawdust. Experiencing wheeze x 2 weeks, cough that minimally productive. She has chronic nasal congestion and drainage, no current sore throat.  Over the weekend she was SOB with both exertion and at rest, breathing through her mouth. Went to ED and received alb/atrovent, didn't really improve her breathing but made her jittery. She is not on any allergy regimen. She is on lisinopril.   ROV 09/29/10 -- follows up for SOB, has hx HOCM with associated CHF, former tobacco. Last time arranged for PFT, also treated her with loratadine + nasonex. Tells me today that her breathing is better - she has used SABA twice since last visit. Her activity is normal.  She is still having nasal gtt and congestion.   PULMONARY FUNCTON TEST 09/29/2010  FVC 2.6  FEV1 1.56  FEV1/FVC 60  FVC  % Predicted 90  FEV % Predicted 73  FeF 25-75 0.74  FeF 25-75 % Predicted 2.53       Objective:   Physical Exam  Gen: Pleasant, well-nourished, in no distress,  normal affect  ENT: No lesions,  mouth clear,  oropharynx clear, mild postnasal drip  Neck: No JVD, no TMG, no carotid bruits  Lungs: No use of accessory muscles, no dullness to percussion, clear without rales or rhonchi  Cardiovascular: RRR, heart sounds normal, no murmur or gallops, no peripheral edema  Musculoskeletal: No deformities, no cyanosis or clubbing  Neuro: alert, non focal  Skin: Warm, no lesions or rashes     Assessment & Plan:  COPD (chronic obstructive  pulmonary disease) 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 Clinically asytmptomatic - continue SABA prn.   Chronic allergic rhinitis - add daily NSW to loratadine and Nasonex

## 2010-09-29 NOTE — Patient Instructions (Signed)
Continue to keep Ventolin available to use as needed We will not start any other inhaled medications at this time.  Continue loratadine and Nasonex Try using nasal saline washes daily to see if this helps with congestion.  Follow up with Dr Delton Coombes in 1 year or sooner if you have any trouble

## 2010-09-29 NOTE — Progress Notes (Signed)
PFT done today. 

## 2010-10-02 ENCOUNTER — Encounter (HOSPITAL_COMMUNITY): Payer: Self-pay

## 2010-10-02 ENCOUNTER — Ambulatory Visit (HOSPITAL_COMMUNITY)
Admission: RE | Admit: 2010-10-02 | Discharge: 2010-10-02 | Disposition: A | Discharge status: Home / Self Care | Payer: 59 | Source: Ambulatory Visit | Attending: Internal Medicine | Admitting: Internal Medicine

## 2010-10-02 VITALS — BP 100/62 | HR 72 | Wt 127.0 lb

## 2010-10-02 DIAGNOSIS — I509 Heart failure, unspecified: Secondary | ICD-10-CM

## 2010-10-02 DIAGNOSIS — I5022 Chronic systolic (congestive) heart failure: Secondary | ICD-10-CM

## 2010-10-02 NOTE — Progress Notes (Signed)
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 She has a h/o HOCM s/p PPM in the 90s at [facility]. PMHx also notable for HTN and ongoing tobacco use. Follow up today after ED evaluation for SOB and chest tightness. Pro BNP was 1546  (Jun 08, 2010  150) Potassium 4.0, BUN  14 and Creatinine 0.77, tropinin 0.0.  She was given 60 mg IV Lasix and Albuterol /Atrovent nebulizer . She was provided a prescription for Lasix 60 mg po daily. She has had no improvement and continues to be SOB on exertion.   Was admitted in December 2011 with acute HF. Echo with EF 30-35%, There was also evidence of ASH with septum of 1.7 PW = 1.9. Diuresed and scheduled for outpt cath.  Cath 12/11: Normal coronaries. EF 40-45%.  Central aortic pressure was 86/47 with a mean of 62.  LV pressure is 74/13 with an EDP of 2.  Right atrial pressure mean of 3.  RV pressure 26/3 with an EDP of 5.  PA pressure 28/5 with a mean of 16.  Pulmonary capillary wedge pressure mean of 7.  Fick cardiac output was 3.4 and cardiac index 2.1.   Wearing nicotine patch. Quit cigarettes February 2012  Echo in March:  EF 50-55%. Grade 2 diastolic dysfunction. IVS 1.4cm.   She is here for follow up today.  She was evaluated by  Dr Delton Coombes 09-29-2010  and started taking Claritin and Nasonex. Walking on treadmill 1 hour per day. She took Lasix 3 times last week and her weight 124 pounds. Today her weight at home 129 pounds.  Sleeping on one pillow at night. She is following low salt diet.  No swelling.  No palpitations or syncope.  Taking BP at home, SBP  At home 60-90s. One episode of dizziness on Friday 9-21. During that episode she did fall against a chair but she did take a dose of Lasix.     ROS: All systems negative except as listed in HPI, PMH and Problem List.  Past Medical History  Diagnosis Date  . Hypertrophic obstructive cardiomyopathy   . Hypokalemia   . CHF (congestive heart failure)   . HTN  (hypertension)     Current Outpatient Prescriptions  Medication Sig Dispense Refill  . albuterol (PROVENTIL,VENTOLIN) 90 MCG/ACT inhaler Inhale 2 puffs into the lungs every 6 (six) hours as needed.        Marland Kitchen aspirin 81 MG tablet 1 tab about every 3 days      . carvedilol (COREG) 12.5 MG tablet Take 18.25 mg by mouth 2 (two) times daily with a meal.       . docusate sodium (COLACE) 100 MG capsule Take 100 mg by mouth 2 (two) times daily. As needed      . furosemide (LASIX) 40 MG tablet Take 40 mg by mouth daily as needed.        Marland Kitchen lisinopril (PRINIVIL,ZESTRIL) 10 MG tablet Take 1 tablet (10 mg total) by mouth daily.  90 tablet  2  . loratadine (CLARITIN) 10 MG tablet Take 1 tablet (10 mg total) by mouth daily.  30 tablet  11  . spironolactone (ALDACTONE) 25 MG tablet Take 0.5 tablets (12.5 mg total) by mouth daily.  15 tablet  3     PHYSICAL EXAM: Filed Vitals:   10/02/10 1401  BP: 100/62  Pulse: 72   General: Anxious appearing   HEENT: normal Neck: supple. JVP flat. Carotids 2+  bilaterally; no bruits. No lymphadenopathy or thryomegaly appreciated. Cor: PMI normal. Regular rate & rhythm. No rubs, gallops or murmurs. Lungs:  Abdomen: soft, nontender, nondistended. No hepatosplenomegaly. No bruits or masses. Good bowel sounds. Extremities: no cyanosis, clubbing, rash, edema Neuro: alert & orientedx3, cranial nerves grossly intact. Moves all 4 extremities w/o difficulty. Affect pleasant.     ASSESSMENT & PLAN:

## 2010-10-02 NOTE — Assessment & Plan Note (Addendum)
NYHA I. Volume status stable. She is instructed to decrease Lisinopril to 5mg  daily only if systolic blood pressure is less than 90. She is instructed to take Carvedilol in am and pm not all at once. Continue daily weights and sliding scale diuretic as needed.  Patient seen and examined with Tonye Becket, NP. We discussed all aspects of the encounter. I agree with the assessment and plan as stated above.

## 2010-10-02 NOTE — Patient Instructions (Signed)
Follow up in 3 months  Decrease Lisinopril to 5mg  daily only  if systolic blood pressure is less than 90  Please take Carvedilol am and pm. Please dont take all Carvedilol in the am.

## 2010-11-05 NOTE — Progress Notes (Signed)
Patient seen and examined with Amy Clegg, NP. We discussed all aspects of the encounter. I agree with the assessment and plan as stated above.   

## 2010-12-20 ENCOUNTER — Telehealth: Payer: Self-pay

## 2011-01-05 ENCOUNTER — Other Ambulatory Visit: Payer: Self-pay | Admitting: Internal Medicine

## 2011-01-18 ENCOUNTER — Other Ambulatory Visit: Payer: Self-pay | Admitting: Internal Medicine

## 2011-01-18 MED ORDER — FUROSEMIDE 40 MG PO TABS: 40.0000 mg | ORAL_TABLET | Freq: Every day | ORAL | Status: DC | PRN

## 2011-01-23 ENCOUNTER — Ambulatory Visit (HOSPITAL_COMMUNITY)
Admission: RE | Admit: 2011-01-23 | Discharge: 2011-01-23 | Disposition: A | Discharge status: Home / Self Care | Payer: 59 | Source: Ambulatory Visit | Attending: Internal Medicine | Admitting: Internal Medicine

## 2011-01-23 VITALS — BP 98/56 | HR 73 | Wt 126.2 lb

## 2011-01-23 DIAGNOSIS — I5032 Chronic diastolic (congestive) heart failure: Secondary | ICD-10-CM | POA: Insufficient documentation

## 2011-01-23 DIAGNOSIS — I509 Heart failure, unspecified: Secondary | ICD-10-CM

## 2011-01-23 NOTE — Progress Notes (Signed)
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 She has a h/o HOCM s/p PPM in the 90s at [facility]. PMHx also notable for HTN and ongoing tobacco use. Follow up today after ED evaluation for SOB and chest tightness. Pro BNP was 1546  (Jun 08, 2010  150) Potassium 4.0, BUN  14 and Creatinine 0.77, tropinin 0.0.  She was given 60 mg IV Lasix and Albuterol /Atrovent nebulizer . She was provided a prescription for Lasix 60 mg po daily. She has had no improvement and continues to be SOB on exertion.   Was admitted in December 2011 with acute HF. Echo with EF 30-35%, There was also evidence of ASH with septum of 1.7 PW = 1.9. Diuresed and scheduled for outpt cath.  Cath 12/11: Normal coronaries. EF 40-45%.  Central aortic pressure was 86/47 with a mean of 62.  LV pressure is 74/13 with an EDP of 2.  Right atrial pressure mean of 3.  RV pressure 26/3 with an EDP of 5.  PA pressure 28/5 with a mean of 16.  Pulmonary capillary wedge pressure mean of 7.  Fick cardiac output was 3.4 and cardiac index 2.1.   Wearing nicotine patch. Quit cigarettes February 2012  Echo in March 2012:  EF 50-55%. Grade 2 diastolic dysfunction. IVS 1.4cm.  PFT 09/29/10: FVC 2.6, FEV1 1.56, FEV1/FVC 60, FeF 25-75% 0.74 (Dr. Delton Coombes will see her prn)   She is here for follow up today.  She feels good today.  Taking extra lasix 1-2 week.  No orthopnea/PND.  Occ lower extremity edema but resolves after extra lasix.  No dizziness.  Weight stable at home.  BP 80-90s at home.  Not using inhalers at this time they felt she had problems with dust/mold at time of carpet removal, she improved quickly    ROS: All systems negative except as listed in HPI, PMH and Problem List.  Past Medical History  Diagnosis Date  . Hypertrophic obstructive cardiomyopathy   . Hypokalemia   . CHF (congestive heart failure)   . HTN (hypertension)     Current Outpatient Prescriptions  Medication Sig Dispense  Refill  . aspirin 81 MG tablet 1 tab about every 3 days      . carvedilol (COREG) 12.5 MG tablet Take 18.25 mg by mouth 2 (two) times daily with a meal.       . docusate sodium (COLACE) 100 MG capsule Take 100 mg by mouth 2 (two) times daily. As needed      . furosemide (LASIX) 40 MG tablet Take 1 tablet (40 mg total) by mouth daily as needed.  30 tablet  6  . lisinopril (PRINIVIL,ZESTRIL) 10 MG tablet Take 1 tablet (10 mg total) by mouth daily.  90 tablet  2  . spironolactone (ALDACTONE) 25 MG tablet TAKE 1/2 TABLET BY MOUTH DAILY  15 tablet  0  . albuterol (PROVENTIL,VENTOLIN) 90 MCG/ACT inhaler Inhale 2 puffs into the lungs every 6 (six) hours as needed.           PHYSICAL EXAM: Filed Vitals:   01/23/11 1444  BP: 98/56  Pulse: 73  Weight: 126 lb 4 oz (57.267 kg)  SpO2: 99%   General: NAD, WDWN   HEENT: normal Neck: supple. JVP flat. Carotids 2+ bilaterally; no bruits. No lymphadenopathy or thryomegaly appreciated. Cor: PMI normal. Regular rate & rhythm. No rubs, gallops or murmurs. Lungs:  Abdomen: soft, nontender, nondistended. No hepatosplenomegaly. No bruits or  masses. Good bowel sounds. Extremities: no cyanosis, clubbing, rash, edema Neuro: alert & orientedx3, cranial nerves grossly intact. Moves all 4 extremities w/o difficulty. Affect pleasant.     ASSESSMENT & PLAN:

## 2011-01-23 NOTE — Patient Instructions (Signed)
Continue current meds  Follow up 6 months  Do the following things EVERYDAY: 1) Weigh yourself in the morning before breakfast. Write it down and keep it in a log. 2) Take your medicines as prescribed 3) Eat low salt foods-Limit salt (sodium) to 2000mg  per day.  4) Stay as active as you can everyday

## 2011-01-23 NOTE — Assessment & Plan Note (Addendum)
NYHA I.  Fluid status good.  Will continue current medications.  Will continue sliding scale lasix.    Patient seen and examined with Ulyess Blossom PA-C. We discussed all aspects of the encounter. I agree with the assessment and plan as stated above.  EF has recovered. Doing great. Continue current regimen.

## 2011-02-13 ENCOUNTER — Other Ambulatory Visit: Payer: Self-pay

## 2011-02-13 MED ORDER — LISINOPRIL 10 MG PO TABS: 10.0000 mg | ORAL_TABLET | Freq: Every day | ORAL | Status: DC

## 2011-02-23 ENCOUNTER — Encounter: Payer: Self-pay | Admitting: *Deleted

## 2011-02-26 ENCOUNTER — Other Ambulatory Visit (HOSPITAL_COMMUNITY): Payer: Self-pay | Admitting: *Deleted

## 2011-02-26 MED ORDER — ASPIRIN 81 MG PO TABS: ORAL_TABLET | ORAL | Status: DC

## 2011-03-15 ENCOUNTER — Encounter: Payer: Self-pay | Admitting: Internal Medicine

## 2011-03-15 ENCOUNTER — Ambulatory Visit (INDEPENDENT_AMBULATORY_CARE_PROVIDER_SITE_OTHER): Payer: 59 | Admitting: *Deleted

## 2011-03-15 DIAGNOSIS — I5023 Acute on chronic systolic (congestive) heart failure: Secondary | ICD-10-CM

## 2011-03-15 LAB — PACEMAKER DEVICE OBSERVATION
AL AMPLITUDE: 2.8 mv
AL IMPEDENCE PM: 622 Ohm
AL THRESHOLD: 0.5 V
RV LEAD IMPEDENCE PM: 550 Ohm

## 2011-03-15 NOTE — Progress Notes (Signed)
PPM check 

## 2011-03-19 ENCOUNTER — Other Ambulatory Visit (HOSPITAL_COMMUNITY): Payer: Self-pay | Admitting: Internal Medicine

## 2011-03-23 ENCOUNTER — Ambulatory Visit (INDEPENDENT_AMBULATORY_CARE_PROVIDER_SITE_OTHER): Payer: 59 | Admitting: Family Medicine

## 2011-03-23 ENCOUNTER — Encounter: Payer: Self-pay | Admitting: Family Medicine

## 2011-03-23 VITALS — BP 125/75 | HR 79 | Temp 97.5°F | Ht 61.0 in | Wt 126.0 lb

## 2011-03-23 DIAGNOSIS — I10 Essential (primary) hypertension: Secondary | ICD-10-CM

## 2011-03-23 DIAGNOSIS — I509 Heart failure, unspecified: Secondary | ICD-10-CM

## 2011-03-23 DIAGNOSIS — I5022 Chronic systolic (congestive) heart failure: Secondary | ICD-10-CM

## 2011-03-23 DIAGNOSIS — F17200 Nicotine dependence, unspecified, uncomplicated: Secondary | ICD-10-CM

## 2011-03-23 DIAGNOSIS — I428 Other cardiomyopathies: Secondary | ICD-10-CM

## 2011-03-23 NOTE — Patient Instructions (Signed)
Schedule your complete physical at your convenience- don't eat before this appt Keep up the good work!  You look great! Congrats on cutting back on the smoking!  You can do this! Call with any questions or concerns Welcome!  We're glad to have you!

## 2011-03-23 NOTE — Progress Notes (Signed)
  Subjective:    Patient ID: Angela Shepherd, female    DOB: [birth year], 61 y.o.   MRN: [id number]  HPI New to establish.  Previous MD- [facility], [place].  Cards- [facility].  Health Maintenance- overdue on pap, mammo 2012.  Has never had colonoscopy.  HTN- chronic problem, well controlled on current meds.  No CP, SOB, HAs, visual changes, edema.  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  CHF- following w/ Dr Gala Romney.  On Lisinopril, Coreg, Lasix, Spironolactone.  Currently asymptomatic.  No swelling.  Following low salt diet.  Walking daily.  Tobacco use- currently on patch, has attended smoking cessation class at [facility].   Review of Systems For ROS see HPI     Objective:   Physical Exam  Vitals reviewed. Constitutional: She is oriented to person, place, and time. She appears well-developed and well-nourished. No distress.  HENT:  Head: Normocephalic and atraumatic.  Eyes: Conjunctivae and EOM are normal. Pupils are equal, round, and reactive to light.  Neck: Normal range of motion. Neck supple. No thyromegaly present.  Cardiovascular: Normal rate, regular rhythm, normal heart sounds and intact distal pulses.   Pulmonary/Chest: Effort normal and breath sounds normal. No respiratory distress.  Abdominal: Soft. She exhibits no distension. There is no tenderness.  Musculoskeletal: She exhibits no edema.  Lymphadenopathy:    She has no cervical adenopathy.  Neurological: She is alert and oriented to person, place, and time.  Skin: Skin is warm and dry.  Psychiatric: She has a normal mood and affect. Her behavior is normal.          Assessment & Plan:

## 2011-03-26 ENCOUNTER — Encounter: Payer: Self-pay | Admitting: Internal Medicine

## 2011-04-03 NOTE — Assessment & Plan Note (Signed)
New to provider.  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  Has a pacer in place.  Following w/ cards.  Will assist as able.

## 2011-04-03 NOTE — Assessment & Plan Note (Signed)
New to provider but chronic for pt.  Attempting to quit.  Attending classes and using patch.  Applauded efforts.  Will continue to follow.

## 2011-04-03 NOTE — Assessment & Plan Note (Signed)
New to provider.  Chronic problem for pt.  Well controlled.  Asymptomatic.  Also following w/ cards.  No changes.

## 2011-04-03 NOTE — Assessment & Plan Note (Signed)
New to provider.  Chronic for pt.  Following w/ Dr Gala Romney in CHF clinic.  Very aware of disease and how to manage sxs- no salt diet, lasix adjustment.  Will follow and assist as able.

## 2011-05-04 ENCOUNTER — Encounter: Payer: Self-pay | Admitting: Internal Medicine

## 2011-05-04 ENCOUNTER — Other Ambulatory Visit (HOSPITAL_COMMUNITY)
Admission: RE | Admit: 2011-05-04 | Discharge: 2011-05-04 | Disposition: A | Discharge status: Home / Self Care | Payer: 59 | Source: Ambulatory Visit | Attending: Family Medicine | Admitting: Family Medicine

## 2011-05-04 ENCOUNTER — Encounter: Payer: Self-pay | Admitting: Family Medicine

## 2011-05-04 ENCOUNTER — Ambulatory Visit (INDEPENDENT_AMBULATORY_CARE_PROVIDER_SITE_OTHER): Payer: 59 | Admitting: Family Medicine

## 2011-05-04 VITALS — BP 114/68 | HR 65 | Temp 98.4°F | Ht 62.0 in | Wt 127.4 lb

## 2011-05-04 DIAGNOSIS — Z01419 Encounter for gynecological examination (general) (routine) without abnormal findings: Secondary | ICD-10-CM | POA: Insufficient documentation

## 2011-05-04 DIAGNOSIS — Z780 Asymptomatic menopausal state: Secondary | ICD-10-CM | POA: Insufficient documentation

## 2011-05-04 DIAGNOSIS — Z23 Encounter for immunization: Secondary | ICD-10-CM

## 2011-05-04 DIAGNOSIS — Z1231 Encounter for screening mammogram for malignant neoplasm of breast: Secondary | ICD-10-CM

## 2011-05-04 DIAGNOSIS — Z1211 Encounter for screening for malignant neoplasm of colon: Secondary | ICD-10-CM

## 2011-05-04 DIAGNOSIS — Z124 Encounter for screening for malignant neoplasm of cervix: Secondary | ICD-10-CM

## 2011-05-04 LAB — BASIC METABOLIC PANEL
Calcium: 9.4 mg/dL (ref 8.4–10.5)
Chloride: 103 mEq/L (ref 96–112)
Creatinine, Ser: 1.1 mg/dL (ref 0.4–1.2)
Sodium: 138 mEq/L (ref 135–145)

## 2011-05-04 LAB — CBC WITH DIFFERENTIAL/PLATELET
Basophils Absolute: 0 10*3/uL (ref 0.0–0.1)
Eosinophils Absolute: 0.3 10*3/uL (ref 0.0–0.7)
Hemoglobin: 13.3 g/dL (ref 12.0–15.0)
Lymphocytes Relative: 26 % (ref 12.0–46.0)
Lymphs Abs: 1.6 10*3/uL (ref 0.7–4.0)
MCHC: 34.2 g/dL (ref 30.0–36.0)
Neutro Abs: 3.8 10*3/uL (ref 1.4–7.7)
Platelets: 163 10*3/uL (ref 150.0–400.0)
RDW: 13.5 % (ref 11.5–14.6)

## 2011-05-04 LAB — LIPID PANEL
Cholesterol: 185 mg/dL (ref 0–200)
LDL Cholesterol: 105 mg/dL — ABNORMAL HIGH (ref 0–99)
Triglycerides: 55 mg/dL (ref 0.0–149.0)
VLDL: 11 mg/dL (ref 0.0–40.0)

## 2011-05-04 LAB — HEPATIC FUNCTION PANEL
Bilirubin, Direct: 0.1 mg/dL (ref 0.0–0.3)
Total Bilirubin: 0.5 mg/dL (ref 0.3–1.2)

## 2011-05-04 NOTE — Progress Notes (Signed)
  Subjective:    Patient ID: Angela Shepherd, female    DOB: 07-Mar-1950, 61 y.o.   MRN: [id number]  HPI CPE- no concerns but 'my nerves are shot'.  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  Health maintenance- due for mammo and DEXA.  Has never had colonoscopy- 'do I have to?'.   Review of Systems Patient reports no vision/ hearing changes, adenopathy,fever, weight change,  persistant/recurrent hoarseness , swallowing issues, chest pain, palpitations, edema, persistant/recurrent cough, hemoptysis, dyspnea (rest/exertional/paroxysmal nocturnal), gastrointestinal bleeding (melena, rectal bleeding), abdominal pain, significant heartburn, bowel changes, GU symptoms (dysuria, hematuria, incontinence), Gyn symptoms (abnormal  bleeding, pain),  syncope, focal weakness, memory loss, numbness & tingling, skin/hair/nail changes, abnormal bruising or bleeding, or depression.     Objective:   Physical Exam  General Appearance:    Alert, cooperative, no distress, appears stated age  Head:    Normocephalic, without obvious abnormality, atraumatic  Eyes:    PERRL, conjunctiva/corneas clear, EOM's intact, fundi    benign, both eyes  Ears:    Normal TM's and external ear canals, both ears  Nose:   Nares normal, septum midline, mucosa normal, no drainage    or sinus tenderness  Throat:   Lips, mucosa, and tongue normal; teeth and gums normal  Neck:   Supple, symmetrical, trachea midline, no adenopathy;    Thyroid: no enlargement/tenderness/nodules  Back:     Symmetric, no curvature, ROM normal, no CVA tenderness  Lungs:     Clear to auscultation bilaterally, respirations unlabored  Chest Wall:    No tenderness or deformity   Heart:    Regular rate and rhythm, S1 and S2 normal, no murmur, rub   or gallop  Breast Exam:    No tenderness, masses, or nipple abnormality  Abdomen:     Soft, non-tender, bowel sounds active all four quadrants,    no  masses, no organomegaly  Genitalia:    External genitalia normal, cervix normal in appearance, no CMT, uterus in normal size and position, adnexa w/out mass or tenderness, mucosa pink and moist, no lesions or discharge present  Rectal:    Normal external appearance  Extremities:   Extremities normal, atraumatic, no cyanosis or edema  Pulses:   2+ and symmetric all extremities  Skin:   Skin color, texture, turgor normal, no rashes or lesions  Lymph nodes:   Cervical, supraclavicular, and axillary nodes normal  Neurologic:   CNII-XII intact, normal strength, sensation and reflexes    throughout          Assessment & Plan:

## 2011-05-04 NOTE — Assessment & Plan Note (Signed)
New.  Pt's PE WNL.  Will refer for colonoscopy, mammo, DEXA.  Check labs.  Anticipatory guidance provided.

## 2011-05-04 NOTE — Assessment & Plan Note (Signed)
Pap collected. 

## 2011-05-04 NOTE — Assessment & Plan Note (Signed)
Due for DEXA- referral made

## 2011-05-04 NOTE — Patient Instructions (Signed)
We'll notify you of your lab results Someone will call you with your mammo/bone density and GI appts Keep up the good work!  You look great! Call with any questions or concerns Hang in there!

## 2011-05-07 ENCOUNTER — Encounter: Payer: Self-pay | Admitting: *Deleted

## 2011-05-17 ENCOUNTER — Ambulatory Visit (AMBULATORY_SURGERY_CENTER): Payer: 59 | Admitting: *Deleted

## 2011-05-17 VITALS — Ht 62.0 in | Wt 126.8 lb

## 2011-05-17 DIAGNOSIS — Z1211 Encounter for screening for malignant neoplasm of colon: Secondary | ICD-10-CM

## 2011-05-17 MED ORDER — NA SULFATE-K SULFATE-MG SULF 17.5-3.13-1.6 GM/177ML PO SOLN: ORAL | Status: DC

## 2011-06-01 ENCOUNTER — Ambulatory Visit (HOSPITAL_COMMUNITY)
Admission: RE | Admit: 2011-06-01 | Discharge: 2011-06-01 | Disposition: A | Discharge status: Home / Self Care | Payer: 59 | Source: Ambulatory Visit | Attending: Family Medicine | Admitting: Family Medicine

## 2011-06-01 DIAGNOSIS — Z1231 Encounter for screening mammogram for malignant neoplasm of breast: Secondary | ICD-10-CM | POA: Insufficient documentation

## 2011-06-01 DIAGNOSIS — Z780 Asymptomatic menopausal state: Secondary | ICD-10-CM

## 2011-06-01 DIAGNOSIS — Z01419 Encounter for gynecological examination (general) (routine) without abnormal findings: Secondary | ICD-10-CM

## 2011-06-07 ENCOUNTER — Encounter: Payer: Self-pay | Admitting: Internal Medicine

## 2011-06-07 ENCOUNTER — Ambulatory Visit (AMBULATORY_SURGERY_CENTER): Payer: 59 | Admitting: Internal Medicine

## 2011-06-07 VITALS — BP 127/66 | HR 67 | Temp 95.8°F | Resp 16 | Ht 62.0 in | Wt 126.0 lb

## 2011-06-07 DIAGNOSIS — D126 Benign neoplasm of colon, unspecified: Secondary | ICD-10-CM

## 2011-06-07 DIAGNOSIS — K620 Anal polyp: Secondary | ICD-10-CM

## 2011-06-07 DIAGNOSIS — Z1211 Encounter for screening for malignant neoplasm of colon: Secondary | ICD-10-CM

## 2011-06-07 DIAGNOSIS — K621 Rectal polyp: Secondary | ICD-10-CM

## 2011-06-07 MED ORDER — SODIUM CHLORIDE 0.9 % IV SOLN: 500.0000 mL | INTRAVENOUS | Status: DC

## 2011-06-07 NOTE — Op Note (Signed)
[facility] [address] [place], [place]  [address]  COLONOSCOPY PROCEDURE REPORT  PATIENT:  Angela, Shepherd  MR#:  [id number] BIRTHDATE:  02/13/1950, 60 yrs. old  GENDER:  female ENDOSCOPIST:  Carie Caddy. Demesha Boorman, MD REF. BY:  Neena Rhymes, M.D. PROCEDURE DATE:  06/07/2011 PROCEDURE:  Colonoscopy with snare polypectomy, Colon with cold biopsy polypectomy ASA CLASS:  Class III INDICATIONS:  Routine Risk Screening, 1st colonoscopy MEDICATIONS:   MAC sedation, administered by CRNA, propofol (Diprivan) 600 mg IV  DESCRIPTION OF PROCEDURE:   After the risks benefits and alternatives of the procedure were thoroughly explained, informed consent was obtained.  Digital rectal exam was performed and revealed small external hemorrhoids.   The LB [id number]-H180AL [id number] endoscope was introduced through the anus and advanced to the cecum, which was identified by both the appendix and ileocecal valve, without limitations.  The quality of the prep was Suprep good.  The instrument was then slowly withdrawn as the colon was fully examined. <<PROCEDUREIMAGES>>  FINDINGS:  Two sessile polyps were found in the cecum. The largest was 15 mm, the smaller 8 mm. The largest polyp was injected with indigo carmine mixed with normal saline for lifting.  Polyps were snared, then cauterized with monopolar cautery. Retrieval was successful.  There were multiple polyps (7) identified and removed at the hepatic flexure and in the transverse colon.  These polyps were sessile and measured 3 mm - 8 mm in size. Polyps were snared without cautery. Retrieval was successful.   A 1 cm sessile polyp was found in the descending colon. Polyp was snared, then cauterized with monopolar cautery. Retrieval was successful and in piecemeal fashion. This polypectomy site was tattooed with [place] ink.  Three sessile polyps were found in the descending colon and measured 3 - 6 mm. Polyps (2) were snared without  cautery. Retrieval was successful. The other polyp (1) was removed using cold biopsy forceps.  Two sessile, 5 mm, polyps were found in the rectum. Polyps were snared without cautery. Retrieval was successful. Mild diverticulosis was found ascending colon to sigmoid colon.   Retroflexed views in the rectum revealed internal hemorrhoids.   The scope was then withdrawn from the cecum and the procedure completed.  COMPLICATIONS:  None  ENDOSCOPIC IMPRESSION: 1) Two polyps in the cecum.  Removed and sent to pathology. 2) Polyps, multiple (7) at the hepatic flexure and in the transverse colon.  Removed and sent to pathology. 3) Sessile polyp in the descending colon. Removed piecemeal and sent to pathology.  Tattoo placed at this polypectomy site. 4) Three polyps in the descending colon. Removed and sent to pathology. 5) Two polyps in the rectum. Removed and sent to pathology. 6) Mild diverticulosis ascending colon to sigmoid colon 7) Internal and external hemorrhoids  RECOMMENDATIONS: 1) Hold aspirin, aspirin products, and anti-inflammatory medication for 2 weeks. 2) Await pathology results 3) High fiber diet. 4) Repeat Colonoscopy in 6 months. 5) You will receive a letter within 1-2 weeks with the results of your biopsy as well as final recommendations. Please call my office if you have not received a letter after 3 weeks.  Carie Caddy. Rhea Belton, MD  CC:  Sheliah Hatch, MD The Patient  n. eSIGNEDCarie Caddy. Estes Lehner at 06/07/2011 10:33 AM  Roberts Gaudy, [phone]

## 2011-06-07 NOTE — Progress Notes (Signed)
Hung 2nd bag of normal saline 0.9% 500 ml at 09:27. Angela Shepherd

## 2011-06-07 NOTE — Patient Instructions (Signed)
Discharge instructions given with verbal understanding. Handouts on polyps,diverticulosis and hemorrhoids given. Resume previous medications. Hold aspirin products for two weeks. YOU HAD AN ENDOSCOPIC PROCEDURE TODAY AT THE [facility]: Refer to the procedure report that was given to you for any specific questions about what was found during the examination.  If the procedure report does not answer your questions, please call your gastroenterologist to clarify.  If you requested that your care partner not be given the details of your procedure findings, then the procedure report has been included in a sealed envelope for you to review at your convenience later.  YOU SHOULD EXPECT: Some feelings of bloating in the abdomen. Passage of more gas than usual.  Walking can help get rid of the air that was put into your GI tract during the procedure and reduce the bloating. If you had a lower endoscopy (such as a colonoscopy or flexible sigmoidoscopy) you may notice spotting of blood in your stool or on the toilet paper. If you underwent a bowel prep for your procedure, then you may not have a normal bowel movement for a few days.  DIET: Your first meal following the procedure should be a light meal and then it is ok to progress to your normal diet.  A half-sandwich or bowl of soup is an example of a good first meal.  Heavy or fried foods are harder to digest and may make you feel nauseous or bloated.  Likewise meals heavy in dairy and vegetables can cause extra gas to form and this can also increase the bloating.  Drink plenty of fluids but you should avoid alcoholic beverages for 24 hours.  ACTIVITY: Your care partner should take you home directly after the procedure.  You should plan to take it easy, moving slowly for the rest of the day.  You can resume normal activity the day after the procedure however you should NOT DRIVE or use heavy machinery for 24 hours (because of the sedation medicines  used during the test).    SYMPTOMS TO REPORT IMMEDIATELY: A gastroenterologist can be reached at any hour.  During normal business hours, 8:30 AM to 5:00 PM Monday through Friday, call [phone].  After hours and on weekends, please call the GI answering service at [phone] who will take a message and have the physician on call contact you.   Following lower endoscopy (colonoscopy or flexible sigmoidoscopy):  Excessive amounts of blood in the stool  Significant tenderness or worsening of abdominal pains  Swelling of the abdomen that is new, acute  Fever of 100F or higher  FOLLOW UP: If any biopsies were taken you will be contacted by phone or by letter within the next 1-3 weeks.  Call your gastroenterologist if you have not heard about the biopsies in 3 weeks.  Our staff will call the home number listed on your records the next business day following your procedure to check on you and address any questions or concerns that you may have at that time regarding the information given to you following your procedure. This is a courtesy call and so if there is no answer at the home number and we have not heard from you through the emergency physician on call, we will assume that you have returned to your regular daily activities without incident.  SIGNATURES/CONFIDENTIALITY: You and/or your care partner have signed paperwork which will be entered into your electronic medical record.  These signatures attest to the fact that that the  information above on your After Visit Summary has been reviewed and is understood.  Full responsibility of the confidentiality of this discharge information lies with you and/or your care-partner.

## 2011-06-07 NOTE — Progress Notes (Signed)
The pt tolerated the colonoscopy very well. Maw   

## 2011-06-07 NOTE — Progress Notes (Signed)
Patient did not experience any of the following events: a burn prior to discharge; a fall within the facility; wrong site/side/patient/procedure/implant event; or a hospital transfer or hospital admission upon discharge from the facility. (G8907) Patient did not have preoperative order for IV antibiotic SSI prophylaxis. (G8918)  

## 2011-06-08 ENCOUNTER — Telehealth: Payer: Self-pay | Admitting: *Deleted

## 2011-06-08 NOTE — Telephone Encounter (Signed)
  Follow up Call-  Call back number 06/07/2011  Post procedure Call Back phone  # [phone]  Permission to leave phone message Yes     Patient questions:  Do you have a fever, pain , or abdominal swelling? no Pain Score  0 *  Have you tolerated food without any problems? yes  Have you been able to return to your normal activities? yes  Do you have any questions about your discharge instructions: Diet   no Medications  no Follow up visit  no  Do you have questions or concerns about your Care? no  Actions: * If pain score is 4 or above: No action needed, pain <4.

## 2011-06-13 ENCOUNTER — Telehealth: Payer: Self-pay | Admitting: *Deleted

## 2011-06-13 NOTE — Telephone Encounter (Signed)
.  left message to have patient return my call to advise pt recent results from bone density exam noting pt has osteopenia and needs to take Ca 1200mg  daily with 800mg  of Vit D, MD Beverely Low will continue to follow. letter mailed to patients home address with results.

## 2011-06-16 ENCOUNTER — Encounter: Payer: Self-pay | Admitting: Internal Medicine

## 2011-06-20 ENCOUNTER — Encounter: Payer: Self-pay | Admitting: Family Medicine

## 2011-06-29 NOTE — Telephone Encounter (Signed)
Called pt to advise Dexa Scan results noted in prior phone note, unable to notify via home number, Noted waiver in pt chart signed to allow detailed messages to be left on voicemail, left detailed message about: results/instructions/prescribtion information. Advise if any further concerns or questions please call our office at [phone].

## 2011-07-09 ENCOUNTER — Ambulatory Visit: Payer: 59 | Attending: Orthopaedic Surgery | Admitting: Physical Therapy

## 2011-07-09 DIAGNOSIS — M546 Pain in thoracic spine: Secondary | ICD-10-CM | POA: Insufficient documentation

## 2011-07-09 DIAGNOSIS — IMO0001 Reserved for inherently not codable concepts without codable children: Secondary | ICD-10-CM | POA: Insufficient documentation

## 2011-07-09 DIAGNOSIS — M542 Cervicalgia: Secondary | ICD-10-CM | POA: Insufficient documentation

## 2011-08-02 ENCOUNTER — Encounter (HOSPITAL_COMMUNITY): Payer: Self-pay

## 2011-08-02 ENCOUNTER — Ambulatory Visit (HOSPITAL_COMMUNITY)
Admission: RE | Admit: 2011-08-02 | Discharge: 2011-08-02 | Disposition: A | Discharge status: Home / Self Care | Payer: 59 | Source: Ambulatory Visit | Attending: Internal Medicine | Admitting: Internal Medicine

## 2011-08-02 VITALS — BP 105/70 | HR 72 | Wt 127.0 lb

## 2011-08-02 DIAGNOSIS — I5032 Chronic diastolic (congestive) heart failure: Secondary | ICD-10-CM | POA: Insufficient documentation

## 2011-08-02 NOTE — Assessment & Plan Note (Signed)
Volume status stable. Continue current diuretic regimen. Reinforce daily weights and limiting fluid intake. Follow up in 6 months.

## 2011-08-02 NOTE — Patient Instructions (Addendum)
Follow up in 6 months  Do the following things EVERYDAY: 1) Weigh yourself in the morning before breakfast. Write it down and keep it in a log. 2) Take your medicines as prescribed 3) Eat low salt foods-Limit salt (sodium) to 2000 mg per day.  4) Stay as active as you can everyday 5) Limit all fluids for the day to less than 2 liters 

## 2011-08-02 NOTE — Progress Notes (Signed)
Patient ID: Angela Shepherd, female   DOB: 1950-07-14, 61 y.o.   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 She has a h/o HOCM s/p PPM in the 90s at [facility]. PMHx also notable for HTN and ongoing tobacco use. Follow up today after ED evaluation for SOB and chest tightness. Pro BNP was 1546  (Jun 08, 2010  150) Potassium 4.0, BUN  14 and Creatinine 0.77, tropinin 0.0.  She was given 60 mg IV Lasix and Albuterol /Atrovent nebulizer . She was provided a prescription for Lasix 60 mg po daily. She has had no improvement and continues to be SOB on exertion.   Was admitted in December 2011 with acute HF. Echo with EF 30-35%, There was also evidence of ASH with septum of 1.7 PW = 1.9. Diuresed and scheduled for outpt cath.  Cath 12/11: Normal coronaries. EF 40-45%.  Central aortic pressure was 86/47 with a mean of 62.  LV pressure is 74/13 with an EDP of 2.  Right atrial pressure mean of 3.  RV pressure 26/3 with an EDP of 5.  PA pressure 28/5 with a mean of 16.  Pulmonary capillary wedge pressure mean of 7.  Fick cardiac output was 3.4 and cardiac index 2.1.   Wearing nicotine patch. Quit cigarettes February 2012  Echo in March:  EF 50-55%. Grade 2 diastolic dysfunction. IVS 1.4cm.   She is here for follow up today. Denies SOB/PND/Orthopnea/CP. Walking on treadmill 30 minutes - 1 hour per day. Weight at home 126 pounds. Compliant with medications. SBP at home 90-100.     ROS: All systems negative except as listed in HPI, PMH and Problem List.  Past Medical History  Diagnosis Date  . Hypertrophic obstructive cardiomyopathy   . Hypokalemia   . CHF (congestive heart failure)   . Migraines   . Heart murmur   . HTN (hypertension)     takes BP meds, not because she has high BP but due to hx CHF    Current Outpatient Prescriptions  Medication Sig Dispense Refill  . aspirin 81 MG tablet 1 tab about every 3 days  30 tablet  12  .  carvedilol (COREG) 12.5 MG tablet TAKE 1 & 1/2 TABLETS BY MOUTH TWICE DAILY  90 tablet  3  . docusate sodium (COLACE) 100 MG capsule Take 100 mg by mouth 2 (two) times daily. As needed      . furosemide (LASIX) 40 MG tablet Take 1 tablet (40 mg total) by mouth daily as needed.  30 tablet  6  . lisinopril (PRINIVIL,ZESTRIL) 10 MG tablet Take 1 tablet (10 mg total) by mouth daily.  90 tablet  2  . loratadine (CLARITIN) 10 MG tablet Take 10 mg by mouth daily.      . Na Sulfate-K Sulfate-Mg Sulf (SUPREP BOWEL PREP) SOLN Suprep as directed  344 mL  0  . nicotine (NICODERM CQ - DOSED IN MG/24 HOURS) 14 mg/24hr patch Place 1 patch onto the skin daily. Wears Monday- Wednesday      . spironolactone (ALDACTONE) 25 MG tablet TAKE 1/2 TABLET BY MOUTH DAILY  15 tablet  3     PHYSICAL EXAM: Filed Vitals:   08/02/11 1501  BP: 105/70  Pulse: 72   General: NAD  HEENT: normal Neck: supple. JVP flat. Carotids 2+ bilaterally; no bruits. No lymphadenopathy or thryomegaly appreciated. Cor: PMI normal. Regular rate & rhythm. No rubs, gallops or murmurs.  Lungs:  Abdomen: soft, nontender, nondistended. No hepatosplenomegaly. No bruits or masses. Good bowel sounds. Extremities: no cyanosis, clubbing, rash, edema Neuro: alert & orientedx3, cranial nerves grossly intact. Moves all 4 extremities w/o difficulty. Affect pleasant.     ASSESSMENT & PLAN:

## 2011-08-16 ENCOUNTER — Other Ambulatory Visit: Payer: Self-pay | Admitting: Internal Medicine

## 2011-09-28 ENCOUNTER — Encounter: Payer: Self-pay | Admitting: Internal Medicine

## 2011-09-28 ENCOUNTER — Ambulatory Visit (INDEPENDENT_AMBULATORY_CARE_PROVIDER_SITE_OTHER): Payer: 59 | Admitting: Internal Medicine

## 2011-09-28 VITALS — BP 101/55 | HR 70 | Ht 62.0 in | Wt 125.0 lb

## 2011-09-28 DIAGNOSIS — R001 Bradycardia, unspecified: Secondary | ICD-10-CM

## 2011-09-28 DIAGNOSIS — I498 Other specified cardiac arrhythmias: Secondary | ICD-10-CM

## 2011-09-28 DIAGNOSIS — I509 Heart failure, unspecified: Secondary | ICD-10-CM

## 2011-09-28 DIAGNOSIS — I5022 Chronic systolic (congestive) heart failure: Secondary | ICD-10-CM

## 2011-09-28 DIAGNOSIS — I428 Other cardiomyopathies: Secondary | ICD-10-CM

## 2011-09-28 NOTE — Patient Instructions (Addendum)
Your physician wants you to follow-up in: 12 months with Dr Jacquiline Doe will receive a reminder letter in the mail two months in advance. If you don't receive a letter, please call our office to schedule the follow-up appointment.   Monthly mednets

## 2011-10-03 LAB — PACEMAKER DEVICE OBSERVATION

## 2011-10-08 ENCOUNTER — Encounter: Payer: Self-pay | Admitting: Internal Medicine

## 2011-10-08 DIAGNOSIS — R001 Bradycardia, unspecified: Secondary | ICD-10-CM | POA: Insufficient documentation

## 2011-10-08 NOTE — Assessment & Plan Note (Signed)
Stable Prior EF depressed, though per Dr Gala Romney echo 3/13 revealed EF 50-55% Continue medical therapy No changes at this time.

## 2011-10-08 NOTE — Assessment & Plan Note (Signed)
Normal pacemaker function See Pace Art report No changes today  

## 2011-10-08 NOTE — Progress Notes (Signed)
Angela Rhymes, MD: Primary Cardiologist:  Dr Gala Romney  Angela Shepherd is a 61 y.o. female with a h/o bradycardia and hypertrophic CM sp PPM Conservation officer, historic buildings) at [facility]  who presents today to establish care in the Electrophysiology device clinic.  She is well known to Dr Gala Romney but has not been seen in our device clinic.   Her device was previously followed by [facility].  Her most recent generator change was in 2002.  She recalls having significant improvement in her dyspnea with initial pacemaker implantation.  She has done quite well recently.    Today, she  denies symptoms of palpitations, chest pain, shortness of breath, orthopnea, PND, lower extremity edema, dizziness, presyncope, syncope, or neurologic sequela.  The patientis tolerating medications without difficulties and is otherwise without complaint today.   Past Medical History  Diagnosis Date  . Hypertrophic obstructive cardiomyopathy   . Hypokalemia   . CHF (congestive heart failure)   . Migraines   . HTN (hypertension)     takes BP meds, not because she has high BP but due to hx CHF   Past Surgical History  Procedure Date  . Pacemaker insertion 1994 and 2002  . Tubal ligation     History   Social History  . Marital Status: Married    Spouse Name: N/A    Number of Children: 2  . Years of Education: N/A   Occupational History  . [employer]    Social History Main Topics  . Smoking status: Former Smoker -- 0.3 packs/day for 10 years    Types: Cigarettes    Quit date: 02/21/2010  . Smokeless tobacco: Never Used  . Alcohol Use: No  . Drug Use: No  . Sexually Active: Not on file   Other Topics Concern  . Not on file   Social History Narrative  . No narrative on file    Family History  Problem Relation Age of Onset  . Diabetes    . Cardiomyopathy    . Heart disease Sister   . Colon cancer Neg Hx   . Esophageal cancer Neg Hx   . Stomach cancer Neg Hx   . Rectal  cancer Neg Hx     No Known Allergies  Current Outpatient Prescriptions  Medication Sig Dispense Refill  . aspirin 81 MG tablet 1 tab about every 3 days  30 tablet  12  . carvedilol (COREG) 12.5 MG tablet TAKE 1 & 1/2 TABLETS BY MOUTH TWICE DAILY  90 tablet  3  . docusate sodium (COLACE) 100 MG capsule Take 100 mg by mouth 2 (two) times daily. As needed      . furosemide (LASIX) 40 MG tablet TAKE 1 TABLET BY MOUTH DAILY AS NEEDED.  30 tablet  6  . lisinopril (PRINIVIL,ZESTRIL) 10 MG tablet Take 1 tablet (10 mg total) by mouth daily.  90 tablet  2  . loratadine (CLARITIN) 10 MG tablet Take 10 mg by mouth daily.      . nicotine (NICODERM CQ - DOSED IN MG/24 HOURS) 14 mg/24hr patch Place 1 patch onto the skin daily. Wears Monday- Wednesday      . spironolactone (ALDACTONE) 25 MG tablet TAKE 1/2 TABLET BY MOUTH DAILY  15 tablet  6    ROS- all systems are reviewed and negative except as per HPI  Physical Exam: Filed Vitals:   09/28/11 1221  BP: 101/55  Pulse: 70  Height: 5\' 2"  (1.575 m)  Weight: 125 lb (56.7 kg)  SpO2: 100%    GEN- The patient is well appearing, alert and oriented x 3 today.   Head- normocephalic, atraumatic Eyes-  Sclera clear, conjunctiva pink Ears- hearing intact Oropharynx- clear Neck- supple, no JVP Lymph- no cervical lymphadenopathy Lungs- Clear to ausculation bilaterally, normal work of breathing Chest- pacemaker pocket is well healed Heart- Regular rate and rhythm,  GI- soft, NT, ND, + BS Extremities- no clubbing, cyanosis, or edema MS- no significant deformity or atrophy Skin- no rash or lesion Psych- euthymic mood, full affect Neuro- strength and sensation are intact  Pacemaker interrogation- reviewed in detail today,  See PACEART report  Assessment and Plan:

## 2011-12-03 ENCOUNTER — Other Ambulatory Visit: Payer: Self-pay | Admitting: Internal Medicine

## 2011-12-05 ENCOUNTER — Other Ambulatory Visit (HOSPITAL_COMMUNITY): Payer: Self-pay | Admitting: Internal Medicine

## 2011-12-05 DIAGNOSIS — I428 Other cardiomyopathies: Secondary | ICD-10-CM

## 2011-12-28 ENCOUNTER — Encounter: Payer: Self-pay | Admitting: Internal Medicine

## 2012-02-05 DIAGNOSIS — I498 Other specified cardiac arrhythmias: Secondary | ICD-10-CM

## 2012-02-29 ENCOUNTER — Other Ambulatory Visit: Payer: Self-pay | Admitting: Internal Medicine

## 2012-03-05 ENCOUNTER — Other Ambulatory Visit: Payer: Self-pay | Admitting: Internal Medicine

## 2012-03-10 ENCOUNTER — Other Ambulatory Visit (HOSPITAL_COMMUNITY): Payer: Self-pay | Admitting: Internal Medicine

## 2012-03-21 ENCOUNTER — Telehealth (HOSPITAL_COMMUNITY): Payer: Self-pay | Admitting: Cardiology

## 2012-03-21 NOTE — Telephone Encounter (Signed)
Message copied by JEFFRIES, Milagros Reap on Fri Mar 21, 2012  2:31 PM ------      Message from: Noralee Space      Created: Mon Mar 03, 2012 11:57 AM       Hey can you sch her for f/u please, she is due ofr just a 6 month f/u, thanks ------

## 2012-03-26 ENCOUNTER — Encounter: Payer: Self-pay | Admitting: *Deleted

## 2012-04-15 DIAGNOSIS — I498 Other specified cardiac arrhythmias: Secondary | ICD-10-CM

## 2012-05-01 ENCOUNTER — Ambulatory Visit (HOSPITAL_COMMUNITY)
Admission: RE | Admit: 2012-05-01 | Discharge: 2012-05-01 | Disposition: A | Discharge status: Home / Self Care | Payer: 59 | Source: Ambulatory Visit | Attending: Internal Medicine | Admitting: Internal Medicine

## 2012-05-01 ENCOUNTER — Ambulatory Visit (HOSPITAL_COMMUNITY): Payer: 59 | Attending: Internal Medicine | Admitting: Radiology

## 2012-05-01 ENCOUNTER — Encounter: Payer: 59 | Admitting: Internal Medicine

## 2012-05-01 ENCOUNTER — Encounter (HOSPITAL_COMMUNITY): Payer: Self-pay

## 2012-05-01 VITALS — BP 116/78 | HR 75 | Wt 121.4 lb

## 2012-05-01 DIAGNOSIS — I509 Heart failure, unspecified: Secondary | ICD-10-CM | POA: Insufficient documentation

## 2012-05-01 DIAGNOSIS — I428 Other cardiomyopathies: Secondary | ICD-10-CM | POA: Insufficient documentation

## 2012-05-01 DIAGNOSIS — J4489 Other specified chronic obstructive pulmonary disease: Secondary | ICD-10-CM | POA: Insufficient documentation

## 2012-05-01 DIAGNOSIS — I5032 Chronic diastolic (congestive) heart failure: Secondary | ICD-10-CM | POA: Insufficient documentation

## 2012-05-01 DIAGNOSIS — I10 Essential (primary) hypertension: Secondary | ICD-10-CM | POA: Insufficient documentation

## 2012-05-01 DIAGNOSIS — R001 Bradycardia, unspecified: Secondary | ICD-10-CM

## 2012-05-01 DIAGNOSIS — I421 Obstructive hypertrophic cardiomyopathy: Secondary | ICD-10-CM | POA: Insufficient documentation

## 2012-05-01 DIAGNOSIS — I498 Other specified cardiac arrhythmias: Secondary | ICD-10-CM | POA: Insufficient documentation

## 2012-05-01 DIAGNOSIS — J449 Chronic obstructive pulmonary disease, unspecified: Secondary | ICD-10-CM | POA: Insufficient documentation

## 2012-05-01 DIAGNOSIS — M549 Dorsalgia, unspecified: Secondary | ICD-10-CM | POA: Insufficient documentation

## 2012-05-01 DIAGNOSIS — Z87891 Personal history of nicotine dependence: Secondary | ICD-10-CM | POA: Insufficient documentation

## 2012-05-01 LAB — BASIC METABOLIC PANEL
Chloride: 103 mEq/L (ref 96–112)
GFR calc Af Amer: 62 mL/min — ABNORMAL LOW (ref >90)
GFR calc non Af Amer: 53 mL/min — ABNORMAL LOW (ref >90)
Potassium: 3.8 mEq/L (ref 3.5–5.1)
Sodium: 140 mEq/L (ref 135–145)

## 2012-05-01 NOTE — Assessment & Plan Note (Signed)
Has follow up with Dr. Johney Frame tomorrow for pacer check

## 2012-05-01 NOTE — Progress Notes (Signed)
Echocardiogram performed.  

## 2012-05-01 NOTE — Patient Instructions (Addendum)
Your physician has requested that you have an echocardiogram. Echocardiography is a painless test that uses sound waves to create images of your heart. It provides your doctor with information about the size and shape of your heart and how well your heart's chambers and valves are working. This procedure takes approximately one hour. There are no restrictions for this procedure.  Follow up 6 months with Dr. Gala Romney  Labs today

## 2012-05-01 NOTE — Assessment & Plan Note (Signed)
Volume status well controlled on daily lasix.  Will continue current therapy.  Last echo in 2012, will recheck now.  BMET today with daily lasix use to follow renal function.

## 2012-05-01 NOTE — Progress Notes (Signed)
PCP: Dr. 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 PMHx also notable for HTN and prior tobacco abuse (quit 02/2010).   Was admitted in December 2011 with acute HF. Echo with EF 30-35%, There was also evidence of ASH with septum of 1.7 PW = 1.9. Diuresed and scheduled for outpt cath.  Cath 12/2009: Normal coronaries. EF 40-45%.  Central aortic pressure was 86/47 with a mean of 62.  LV pressure is 74/13 with an EDP of 2.  Right atrial pressure mean of 3.  RV pressure 26/3 with an EDP of 5.  PA pressure 28/5 with a mean of 16.  Pulmonary capillary wedge pressure mean of 7.  Fick cardiac output was 3.4 and cardiac index 2.1.   Echo in March 2012:  EF 50-55%. Grade 2 diastolic dysfunction. IVS 1.4cm.   She is here for 6 month follow up today. Denies SOB/PND/Orthopnea/CP. Walking on treadmill 30 minutes daily.  Runny nose and post nasal drainage.  Compliant with medications.  Taking lasix daily.  Burning sensation right shoulder blade, sometimes radiates to front.  Usually occurs when typing or sitting at desk but never occurs with exercise.   ROS: All systems negative except as listed in HPI, PMH and Problem List.  Past Medical History  Diagnosis Date  . Hypertrophic obstructive cardiomyopathy   . Hypokalemia   . CHF (congestive heart failure)   . Migraines   . HTN (hypertension)     takes BP meds, not because she has high BP but due to hx CHF    Current Outpatient Prescriptions  Medication Sig Dispense Refill  . ASPIR-LOW 81 MG EC tablet TAKE 1 TABLET BY MOUTH ABOUT EVERY 3 DAYS  30 tablet  11  . carvedilol (COREG) 12.5 MG tablet TAKE 1 & 1/2 TABLETS BY MOUTH TWICE DAILY  90 tablet  4  . docusate sodium (COLACE) 100 MG capsule Take 100 mg by mouth 2 (two) times daily. As needed      . furosemide (LASIX) 40 MG tablet TAKE 1 TABLET BY MOUTH DAILY AS NEEDED.  30 tablet  6   . lisinopril (PRINIVIL,ZESTRIL) 10 MG tablet TAKE 1 TABLET BY MOUTH DAILY.  90 tablet  2  . loratadine (CLARITIN) 10 MG tablet Take 10 mg by mouth daily.      . nicotine (NICODERM CQ - DOSED IN MG/24 HOURS) 14 mg/24hr patch Place 1 patch onto the skin daily. Wears Monday- Wednesday      . spironolactone (ALDACTONE) 25 MG tablet TAKE 1/2 TABLET BY MOUTH DAILY  15 tablet  6   No current facility-administered medications for this encounter.     PHYSICAL EXAM: Filed Vitals:   05/01/12 1408  BP: 116/78  Pulse: 75  Weight: 121 lb 6.4 oz (55.067 kg)  SpO2: 98%    General: NAD  HEENT: normal Neck: supple. JVP flat. Carotids 2+ bilaterally; no bruits. No lymphadenopathy or thryomegaly appreciated. Cor: PMI normal. Regular rate & rhythm. No rubs, gallops or murmurs. Lungs:  Abdomen: soft, nontender, nondistended. No hepatosplenomegaly. No bruits or masses. Good bowel sounds. Extremities: no cyanosis, clubbing, rash, edema Neuro: alert & orientedx3, cranial nerves grossly intact. Moves all 4 extremities w/o difficulty. Affect pleasant.     ASSESSMENT & PLAN:

## 2012-05-01 NOTE — Assessment & Plan Note (Signed)
Sounds like pinched nerve.  She will follow up with Dr. Beverely Low.

## 2012-05-02 ENCOUNTER — Encounter (HOSPITAL_COMMUNITY): Payer: Self-pay | Admitting: Pharmacy Technician

## 2012-05-02 ENCOUNTER — Encounter: Payer: Self-pay | Admitting: Cardiology

## 2012-05-02 ENCOUNTER — Ambulatory Visit (INDEPENDENT_AMBULATORY_CARE_PROVIDER_SITE_OTHER): Payer: 59 | Admitting: Cardiology

## 2012-05-02 VITALS — BP 90/54 | HR 63 | Ht 62.0 in | Wt 123.8 lb

## 2012-05-02 DIAGNOSIS — I498 Other specified cardiac arrhythmias: Secondary | ICD-10-CM

## 2012-05-02 DIAGNOSIS — Z950 Presence of cardiac pacemaker: Secondary | ICD-10-CM

## 2012-05-02 DIAGNOSIS — I421 Obstructive hypertrophic cardiomyopathy: Secondary | ICD-10-CM

## 2012-05-02 DIAGNOSIS — Z01812 Encounter for preprocedural laboratory examination: Secondary | ICD-10-CM

## 2012-05-02 DIAGNOSIS — R001 Bradycardia, unspecified: Secondary | ICD-10-CM

## 2012-05-02 LAB — CBC WITH DIFFERENTIAL/PLATELET
Basophils Absolute: 0.1 10*3/uL (ref 0.0–0.1)
Eosinophils Relative: 2.4 % (ref 0.0–5.0)
HCT: 40.5 % (ref 36.0–46.0)
Hemoglobin: 14 g/dL (ref 12.0–15.0)
Lymphocytes Relative: 39.1 % (ref 12.0–46.0)
Lymphs Abs: 3.1 10*3/uL (ref 0.7–4.0)
Monocytes Relative: 7.7 % (ref 3.0–12.0)
Neutro Abs: 4 10*3/uL (ref 1.4–7.7)
RBC: 4.22 Mil/uL (ref 3.87–5.11)
RDW: 13.6 % (ref 11.5–14.6)
WBC: 8 10*3/uL (ref 4.5–10.5)

## 2012-05-02 NOTE — Progress Notes (Signed)
 ELECTROPHYSIOLOGY OFFICE NOTE  Patient ID: Angela Shepherd MRN: [id number], DOB/AGE: 61/03/1950   Date of Visit: 05/02/2012  Primary Physician: Katherine Tabori, MD Primary Cardiologist / EP: Bensimhon, MD / Allred, MD Reason for Visit: EP/device follow-up; battery at ERI  History of Present Illness  Angela Shepherd is a 62 year old woman with HCM, bradycardia s/p PPM in 1994 at [facility], gen change 2002, HTN and prior tobacco abuse. She is also followed regularly in the HF clinic.   She was admitted in December 2011 with acute HF. Echo with EF 30-35% and evidence of ASH with septum of 1.7 PW = 1.9. She was diuresed and scheduled for outpt cath. Cath December 2011: Normal coronaries. EF 40-45%. Central aortic pressure was 86/47 with a mean of 62. LV pressure is 74/13 with an EDP of 2. Right atrial pressure mean of 3. RV pressure 26/3 with an EDP of 5. PA pressure 28/5 with a mean of 16. Pulmonary capillary wedge pressure mean of 7. Fick cardiac output was 3.4 and cardiac index 2.1. Echo in March 2012: EF 50-55%. Grade 2 diastolic dysfunction. IVS 1.4cm. She was evaluated yesterday in the HF clinic and an echo was ordered.  She presents today for routine electrophysiology followup. Since last being seen in our clinic, she reports she is doing well. She has no cardiac complaints. Her PPM battery was found to be at ERI by [facility]; therefore, she was scheduled with me. Today, she denies chest pain or shortness of breath. She denies palpitations, dizziness, near syncope or syncope. She sleeps on 2 pillows but denies having to increase this recently. She denies LE swelling, PND or recent weight gain. Angela Shepherd reports that she is compliant and tolerating medications without difficulty.  Past Medical History Past Medical History  Diagnosis Date  . Hypertrophic obstructive cardiomyopathy   . Hypokalemia   . CHF (congestive heart failure)   . Migraines   . HTN (hypertension)       takes BP meds, not because she has high BP but due to hx CHF    Past Surgical History Past Surgical History  Procedure Laterality Date  . Pacemaker insertion  1994 and 2002  . Tubal ligation      Allergies/Intolerances No Known Allergies  Current Home Medications Current Outpatient Prescriptions  Medication Sig Dispense Refill  . ASPIR-LOW 81 MG EC tablet TAKE 1 TABLET BY MOUTH ABOUT EVERY 3 DAYS  30 tablet  11  . carvedilol (COREG) 12.5 MG tablet TAKE 1 & 1/2 TABLETS BY MOUTH TWICE DAILY  90 tablet  4  . docusate sodium (COLACE) 100 MG capsule Take 100 mg by mouth 2 (two) times daily. As needed      . furosemide (LASIX) 40 MG tablet TAKE 1 TABLET BY MOUTH DAILY AS NEEDED.  30 tablet  6  . lisinopril (PRINIVIL,ZESTRIL) 10 MG tablet TAKE 1 TABLET BY MOUTH DAILY.  90 tablet  2  . loratadine (CLARITIN) 10 MG tablet Take 10 mg by mouth daily.      . nicotine (NICODERM CQ - DOSED IN MG/24 HOURS) 14 mg/24hr patch Place 1 patch onto the skin daily. Wears Monday- Wednesday      . spironolactone (ALDACTONE) 25 MG tablet TAKE 1/2 TABLET BY MOUTH DAILY  15 tablet  6   No current facility-administered medications for this visit.   Social History Social History  . Marital Status: Married   Occupational History  . [employer]    Social History   Main Topics  . Smoking status: Former Smoker -- 0.30 packs/day for 10 years    Types: Cigarettes    Quit date: 02/21/2010  . Smokeless tobacco: Never Used  . Alcohol Use: No  . Drug Use: No   Review of Systems General: No chills, fever, night sweats or weight changes Cardiovascular: No chest pain, dyspnea on exertion, edema, orthopnea, palpitations, paroxysmal nocturnal dyspnea Dermatological: No rash, lesions or masses Respiratory: No cough, dyspnea Urologic: No hematuria, dysuria Abdominal: No nausea, vomiting, diarrhea, bright red blood per rectum, melena, or hematemesis Neurologic: No visual changes, weakness, changes in  mental status All other systems reviewed and are otherwise negative except as noted above.  Physical Exam Blood pressure 90/54, pulse 63, height 5' 2" (1.575 m), weight 123 lb 12.8 oz (56.155 kg).  General: Well developed, well appearing, in no acute distress. HEENT: Normocephalic, atraumatic. EOMs intact. Sclera nonicteric. Oropharynx clear.  Neck: Supple without bruits. No JVD. Lungs: Respirations regular and unlabored, CTA bilaterally. No wheezes, rales or rhonchi. Heart: RRR. S1, S2 present. No murmurs, rub, S3 or S4. Abdomen: Soft, non-tender, non-distended. BS present x 4 quadrants. No hepatosplenomegaly.  Extremities: No clubbing, cyanosis or edema. DP/PT/Radials 2+ and equal bilaterally. Psych: Normal affect. Neuro: Alert and oriented X 3. Moves all extremities spontaneously.   Diagnostics Echocardiogram 05/01/2012 Left ventricle: LVEF is approximately 40 to 45% with inferoseptal, distal inferior, distal anterior and apical hypokinesis. The cavity size was normal. Wall thickness was increased in a pattern of severe LVH. ------------------------------------------------------------ Aortic valve: Mildly thickened leaflets. Doppler: Mild regurgitation. ------------------------------------------------------------ Mitral valve: Calcified annulus. Mildly thickened leaflets. Doppler: Mild regurgitation. Peak gradient: 4mm Hg (D). ------------------------------------------------------------ Left atrium: The atrium was mildly dilated. ------------------------------------------------------------ Right ventricle: RVEF is mildly depressed. LVEF is mildlydecreased. The cavity size was mildly dilated. Pacer wire or catheter noted in right ventricle. ------------------------------------------------------------ Tricuspid valve: Structurally normal valve. Leaflet separation was normal. Doppler: Transvalvular velocity was within the normal range. Mild  regurgitation. ------------------------------------------------------------ Right atrium: The atrium was normal in size. Pacer wire or catheter noted in right atrium. ------------------------------------------------------------ Systemic veins: Inferior vena cava: The vessel was normal in size; the respirophasic diameter changes were in the normal range (=50%); findings are consistent with normal central venous pressure.  Device interrogation limited due to battery status - battery at ERI; R waves >11.8; 1 VHR episode lasting 2 seconds, no EGM   Assessment and Plan 1. Bradycardia s/p PPM implant - battery at ERI 2. Hypertrophic cardiomyopathy 3. LV dysfunction, EF 40-45% by echo yesterday  Angela Shepherd PPM needs to be replaced. I reviewed her echo findings. Given her EF is now 40-45%, I spoke with Dr. Allred regarding her care. We discussed dual chamber PPM vs BiV PPM. He has recommended proceeding with dual chamber PPM generator change at this time. I reviewed the procedure with Angela Shepherd in detail, including risks and benefits. These risks include but are not limited to lead dislodgement, bleeding or infection. She expressed verbal understanding and agrees to proceed.   Signed, Minnetta Sandora, PA-C 05/02/2012, 2:57 PM    

## 2012-05-02 NOTE — Patient Instructions (Addendum)
Patient is scheduled for a Pacemaker Generator Change on 05/08/2012 @ 3:00 pm., must arrive at Short Stay 2 hours prior to procedure.  Please see instruction sheet.  Your physician recommends that you have labs today: CBC w/Diff  Your physician recommends that you continue on your current medications as directed. Please refer to the Current Medication list given to you today.

## 2012-05-05 ENCOUNTER — Telehealth: Payer: Self-pay | Admitting: Internal Medicine

## 2012-05-05 NOTE — Telephone Encounter (Signed)
Ok for a clear liquid diet  Nothing after 7am

## 2012-05-05 NOTE — Telephone Encounter (Signed)
New Problem:    Patient called in because she was scheduled by Nehemiah Settle to have a procedure performed by Dr. Johney Frame on Thursday and would like to know what availabilities Dr. Johney Frame had on Tuesday to perform procedures.  Please call back.

## 2012-05-06 ENCOUNTER — Other Ambulatory Visit: Payer: Self-pay | Admitting: *Deleted

## 2012-05-06 DIAGNOSIS — Z45018 Encounter for adjustment and management of other part of cardiac pacemaker: Secondary | ICD-10-CM

## 2012-05-06 DIAGNOSIS — R001 Bradycardia, unspecified: Secondary | ICD-10-CM

## 2012-05-07 MED ORDER — SODIUM CHLORIDE 0.9 % IR SOLN
80.0000 mg | Status: DC
  Filled 2012-05-07: qty 2

## 2012-05-07 MED ORDER — CEFAZOLIN SODIUM-DEXTROSE 2-3 GM-% IV SOLR
2.0000 g | INTRAVENOUS | Status: DC
Start: 2012-05-07 — End: 2012-05-08
  Filled 2012-05-07 (×2): qty 50

## 2012-05-08 ENCOUNTER — Encounter (HOSPITAL_COMMUNITY): Payer: Self-pay | Admitting: General Practice

## 2012-05-08 ENCOUNTER — Ambulatory Visit (HOSPITAL_COMMUNITY)
Admission: RE | Admit: 2012-05-08 | Discharge: 2012-05-09 | Disposition: A | Discharge status: Home / Self Care | Payer: 59 | Source: Ambulatory Visit | Attending: Internal Medicine | Admitting: Internal Medicine

## 2012-05-08 ENCOUNTER — Encounter (HOSPITAL_COMMUNITY)
Admission: RE | Disposition: A | Discharge status: Home / Self Care | Payer: Self-pay | Source: Ambulatory Visit | Attending: Internal Medicine

## 2012-05-08 ENCOUNTER — Ambulatory Visit (HOSPITAL_COMMUNITY): Payer: 59

## 2012-05-08 DIAGNOSIS — I10 Essential (primary) hypertension: Secondary | ICD-10-CM | POA: Insufficient documentation

## 2012-05-08 DIAGNOSIS — I422 Other hypertrophic cardiomyopathy: Secondary | ICD-10-CM | POA: Insufficient documentation

## 2012-05-08 DIAGNOSIS — R001 Bradycardia, unspecified: Secondary | ICD-10-CM | POA: Diagnosis present

## 2012-05-08 DIAGNOSIS — I5022 Chronic systolic (congestive) heart failure: Secondary | ICD-10-CM | POA: Diagnosis present

## 2012-05-08 DIAGNOSIS — Z45018 Encounter for adjustment and management of other part of cardiac pacemaker: Secondary | ICD-10-CM

## 2012-05-08 DIAGNOSIS — I498 Other specified cardiac arrhythmias: Secondary | ICD-10-CM

## 2012-05-08 DIAGNOSIS — I440 Atrioventricular block, first degree: Secondary | ICD-10-CM | POA: Insufficient documentation

## 2012-05-08 DIAGNOSIS — I509 Heart failure, unspecified: Secondary | ICD-10-CM | POA: Insufficient documentation

## 2012-05-08 DIAGNOSIS — I441 Atrioventricular block, second degree: Secondary | ICD-10-CM | POA: Insufficient documentation

## 2012-05-08 DIAGNOSIS — Z87891 Personal history of nicotine dependence: Secondary | ICD-10-CM | POA: Insufficient documentation

## 2012-05-08 HISTORY — DX: Hypotension, unspecified: I95.9

## 2012-05-08 HISTORY — DX: Cluster headache syndrome, unspecified, not intractable: G44.009

## 2012-05-08 HISTORY — DX: Pneumonia, unspecified organism: J18.9

## 2012-05-08 HISTORY — DX: Chronic systolic (congestive) heart failure: I50.22

## 2012-05-08 HISTORY — PX: BI-VENTRICULAR PACEMAKER UPGRADE: SHX5464

## 2012-05-08 HISTORY — DX: Bradycardia, unspecified: R00.1

## 2012-05-08 SURGERY — BI-VENTRICULAR PACEMAKER UPGRADE
Anesthesia: LOCAL

## 2012-05-08 MED ORDER — SODIUM CHLORIDE 0.9 % IV SOLN
INTRAVENOUS | Status: DC
  Administered 2012-05-08: 13:00:00 via INTRAVENOUS

## 2012-05-08 MED ORDER — SODIUM CHLORIDE 0.9 % IV BOLUS (SEPSIS)
250.0000 mL | Freq: Once | INTRAVENOUS | Status: AC
  Administered 2012-05-08: 20:00:00 250 mL via INTRAVENOUS

## 2012-05-08 MED ORDER — ACETAMINOPHEN 325 MG PO TABS: 325.0000 mg | ORAL_TABLET | ORAL | Status: DC | PRN

## 2012-05-08 MED ORDER — SODIUM CHLORIDE 0.9 % IV SOLN: 250.0000 mL | INTRAVENOUS | Status: DC | PRN

## 2012-05-08 MED ORDER — CEFAZOLIN SODIUM 1-5 GM-% IV SOLN
1.0000 g | Freq: Four times a day (QID) | INTRAVENOUS | Status: AC
  Administered 2012-05-08 – 2012-05-09 (×3): 1 g via INTRAVENOUS
  Filled 2012-05-08 (×3): qty 50

## 2012-05-08 MED ORDER — ONDANSETRON HCL 4 MG/2ML IJ SOLN: 4.0000 mg | Freq: Four times a day (QID) | INTRAMUSCULAR | Status: DC | PRN

## 2012-05-08 MED ORDER — FENTANYL CITRATE 0.05 MG/ML IJ SOLN
INTRAMUSCULAR | Status: AC
  Filled 2012-05-08: qty 2

## 2012-05-08 MED ORDER — LIDOCAINE HCL (PF) 1 % IJ SOLN
INTRAMUSCULAR | Status: AC
  Filled 2012-05-08: qty 60

## 2012-05-08 MED ORDER — MUPIROCIN 2 % EX OINT
TOPICAL_OINTMENT | CUTANEOUS | Status: AC
  Administered 2012-05-08: 1 via NASAL
  Filled 2012-05-08: qty 22

## 2012-05-08 MED ORDER — SODIUM CHLORIDE 0.9 % IJ SOLN: 3.0000 mL | Freq: Two times a day (BID) | INTRAMUSCULAR | Status: DC

## 2012-05-08 MED ORDER — HYDROCODONE-ACETAMINOPHEN 5-325 MG PO TABS
1.0000 | ORAL_TABLET | ORAL | Status: DC | PRN
  Administered 2012-05-09: 13:00:00 1 via ORAL
  Filled 2012-05-08: qty 1

## 2012-05-08 MED ORDER — MIDAZOLAM HCL 5 MG/5ML IJ SOLN
INTRAMUSCULAR | Status: AC
  Filled 2012-05-08: qty 5

## 2012-05-08 MED ORDER — SODIUM CHLORIDE 0.9 % IJ SOLN: 3.0000 mL | INTRAMUSCULAR | Status: DC | PRN

## 2012-05-08 MED ORDER — SODIUM CHLORIDE 0.9 % IR SOLN
Freq: Once | Status: DC
  Filled 2012-05-08 (×2): qty 2

## 2012-05-08 MED ORDER — CHLORHEXIDINE GLUCONATE 4 % EX LIQD: 60.0000 mL | Freq: Once | CUTANEOUS | Status: DC

## 2012-05-08 MED ORDER — MUPIROCIN 2 % EX OINT: TOPICAL_OINTMENT | Freq: Two times a day (BID) | CUTANEOUS | Status: DC

## 2012-05-08 NOTE — Brief Op Note (Signed)
Successful upgrade of her device to a SJM Anthem biventricular pacemaker No early apparent complications.  See dictation # [phone]

## 2012-05-08 NOTE — Progress Notes (Signed)
Rec'd a call from RN. Pt BP was lower than usual for her. Post-procedure, her SBP was 90s, now in the 60s and 70s (manual cuff). Pt is asymptomatic and is resting comfortably. Her BP is < 100 at times but this is low for her. Home meds have been held for now.   PO intake was decreased today because of the procedure. Will give slow bolus of 250 cc IVF over 3 hours. Continue to follow VS and watch for symptoms.

## 2012-05-08 NOTE — Interval H&P Note (Signed)
History and Physical Interval Note:  Dr Gala Romney and I have reviewed the patients echo and discussed her case at length.  She has an EF of 40% likely due to V pacing with NYHA class II/III CHF symptoms despite optimal medical therapy.  We both agree that if able, we should proceed with upgrade to a biv pacemaker at time of generator change.  I have had a long discussion with the patient regarding this option.  She understands the risks, benefits, and alternatives to upgrade to a biventricular pacemaker today and wishes to proceed.    05/08/2012 2:30 PM  Angela Shepherd  has presented today for surgery, with the diagnosis of pacemake eol  The various methods of treatment have been discussed with the patient and family. After consideration of risks, benefits and other options for treatment, the patient has consented to  Procedure(s): BI-VENTRICULAR PACEMAKER UPGRADE (N/A) as a surgical intervention .  The patient's history has been reviewed, patient examined, no change in status, stable for surgery.  I have reviewed the patient's chart and labs.  Questions were answered to the patient's satisfaction.     Hillis Range

## 2012-05-08 NOTE — Progress Notes (Signed)
Dr. Tresa Endo on-call notified of pt. BP of 65/40 per manual BP after most of fluid bolus given. She is asymptomatic with this. No new orders at this time and will continue to monitor for changes in status.

## 2012-05-08 NOTE — Progress Notes (Signed)
Reported low blood pressures to [employer] PA. Blood pressures 60's/30's. Patient has had NS at 50 mls/hr since 1700. Has not had any medications today and would normally had taken coreg, lisinopril, aldactone and lasix. Patient reported that her pressure is 60's/30's to 90's/60's usually and she is asymptomatic. Manual blood pressure 72/40 taken and reported to [facility] PA. Orders received for fluid bolus. Reported to Lelon Perla RN.

## 2012-05-08 NOTE — H&P (View-Only) (Signed)
ELECTROPHYSIOLOGY OFFICE NOTE  Patient ID: Angela Shepherd MRN: [id number], DOB/AGE: [birth year]   Date of Visit: 05/02/2012  Primary Physician: Neena Rhymes, MD Primary Cardiologist / EP: Gala Romney, MD / Johney Frame, MD Reason for Visit: EP/device follow-up; battery at ERI  History of Present Illness  Angela Shepherd is a 62 year old woman with HCM, bradycardia s/p PPM in 1994 at [facility], gen change 2002, HTN and prior tobacco abuse. She is also followed regularly in the HF clinic.   She was admitted in December 2011 with acute HF. Echo with EF 30-35% and evidence of ASH with septum of 1.7 PW = 1.9. She was diuresed and scheduled for outpt cath. Cath December 2011: Normal coronaries. EF 40-45%. Central aortic pressure was 86/47 with a mean of 62. LV pressure is 74/13 with an EDP of 2. Right atrial pressure mean of 3. RV pressure 26/3 with an EDP of 5. PA pressure 28/5 with a mean of 16. Pulmonary capillary wedge pressure mean of 7. Fick cardiac output was 3.4 and cardiac index 2.1. Echo in March 2012: EF 50-55%. Grade 2 diastolic dysfunction. IVS 1.4cm. She was evaluated yesterday in the HF clinic and an echo was ordered.  She presents today for routine electrophysiology followup. Since last being seen in our clinic, she reports she is doing well. She has no cardiac complaints. Her PPM battery was found to be at [facility] by [facility]; therefore, she was scheduled with me. Today, she denies chest pain or shortness of breath. She denies palpitations, dizziness, near syncope or syncope. She sleeps on 2 pillows but denies having to increase this recently. She denies LE swelling, PND or recent weight gain. Angela Shepherd reports that she is compliant and tolerating medications without difficulty.  Past Medical History Past Medical History  Diagnosis Date  . Hypertrophic obstructive cardiomyopathy   . Hypokalemia   . CHF (congestive heart failure)   . Migraines   . HTN (hypertension)       takes BP meds, not because she has high BP but due to hx CHF    Past Surgical History Past Surgical History  Procedure Laterality Date  . Pacemaker insertion  1994 and 2002  . Tubal ligation      Allergies/Intolerances No Known Allergies  Current Home Medications Current Outpatient Prescriptions  Medication Sig Dispense Refill  . ASPIR-LOW 81 MG EC tablet TAKE 1 TABLET BY MOUTH ABOUT EVERY 3 DAYS  30 tablet  11  . carvedilol (COREG) 12.5 MG tablet TAKE 1 & 1/2 TABLETS BY MOUTH TWICE DAILY  90 tablet  4  . docusate sodium (COLACE) 100 MG capsule Take 100 mg by mouth 2 (two) times daily. As needed      . furosemide (LASIX) 40 MG tablet TAKE 1 TABLET BY MOUTH DAILY AS NEEDED.  30 tablet  6  . lisinopril (PRINIVIL,ZESTRIL) 10 MG tablet TAKE 1 TABLET BY MOUTH DAILY.  90 tablet  2  . loratadine (CLARITIN) 10 MG tablet Take 10 mg by mouth daily.      . nicotine (NICODERM CQ - DOSED IN MG/24 HOURS) 14 mg/24hr patch Place 1 patch onto the skin daily. Wears Monday- Wednesday      . spironolactone (ALDACTONE) 25 MG tablet TAKE 1/2 TABLET BY MOUTH DAILY  15 tablet  6   No current facility-administered medications for this visit.   Social History Social History  . Marital Status: Married   Occupational History  . [employer]    Social History  Main Topics  . Smoking status: Former Smoker -- 0.30 packs/day for 10 years    Types: Cigarettes    Quit date: 02/21/2010  . Smokeless tobacco: Never Used  . Alcohol Use: No  . Drug Use: No   Review of Systems General: No chills, fever, night sweats or weight changes Cardiovascular: No chest pain, dyspnea on exertion, edema, orthopnea, palpitations, paroxysmal nocturnal dyspnea Dermatological: No rash, lesions or masses Respiratory: No cough, dyspnea Urologic: No hematuria, dysuria Abdominal: No nausea, vomiting, diarrhea, bright red blood per rectum, melena, or hematemesis Neurologic: No visual changes, weakness, changes in  mental status All other systems reviewed and are otherwise negative except as noted above.  Physical Exam Blood pressure 90/54, pulse 63, height 5\' 2"  (1.575 m), weight 123 lb 12.8 oz (56.155 kg).  General: Well developed, well appearing, in no acute distress. HEENT: Normocephalic, atraumatic. EOMs intact. Sclera nonicteric. Oropharynx clear.  Neck: Supple without bruits. No JVD. Lungs: Respirations regular and unlabored, CTA bilaterally. No wheezes, rales or rhonchi. Heart: RRR. S1, S2 present. No murmurs, rub, S3 or S4. Abdomen: Soft, non-tender, non-distended. BS present x 4 quadrants. No hepatosplenomegaly.  Extremities: No clubbing, cyanosis or edema. DP/PT/Radials 2+ and equal bilaterally. Psych: Normal affect. Neuro: Alert and oriented X 3. Moves all extremities spontaneously.   Diagnostics Echocardiogram 05/01/2012 Left ventricle: LVEF is approximately 40 to 45% with inferoseptal, distal inferior, distal anterior and apical hypokinesis. The cavity size was normal. Wall thickness was increased in a pattern of severe LVH. ------------------------------------------------------------ Aortic valve: Mildly thickened leaflets. Doppler: Mild regurgitation. ------------------------------------------------------------ Mitral valve: Calcified annulus. Mildly thickened leaflets. Doppler: Mild regurgitation. Peak gradient: 4mm Hg (D). ------------------------------------------------------------ Left atrium: The atrium was mildly dilated. ------------------------------------------------------------ Right ventricle: RVEF is mildly depressed. LVEF is mildlydecreased. The cavity size was mildly dilated. Pacer wire or catheter noted in right ventricle. ------------------------------------------------------------ Tricuspid valve: Structurally normal valve. Leaflet separation was normal. Doppler: Transvalvular velocity was within the normal range. Mild  regurgitation. ------------------------------------------------------------ Right atrium: The atrium was normal in size. Pacer wire or catheter noted in right atrium. ------------------------------------------------------------ Systemic veins: Inferior vena cava: The vessel was normal in size; the respirophasic diameter changes were in the normal range (=50%); findings are consistent with normal central venous pressure.  Device interrogation limited due to battery status - battery at ERI; R waves >11.8; 1 VHR episode lasting 2 seconds, no EGM   Assessment and Plan 1. Bradycardia s/p PPM implant - battery at ERI 2. Hypertrophic cardiomyopathy 3. LV dysfunction, EF 40-45% by echo yesterday  Angela Shepherd's PPM needs to be replaced. I reviewed her echo findings. Given her EF is now 40-45%, I spoke with Dr. Johney Frame regarding her care. We discussed dual chamber PPM vs BiV PPM. He has recommended proceeding with dual chamber PPM generator change at this time. I reviewed the procedure with Angela Shepherd in detail, including risks and benefits. These risks include but are not limited to lead dislodgement, bleeding or infection. She expressed verbal understanding and agrees to proceed.   Signed, Rick Duff, PA-C 05/02/2012, 2:57 PM

## 2012-05-09 ENCOUNTER — Ambulatory Visit (HOSPITAL_COMMUNITY): Payer: 59

## 2012-05-09 ENCOUNTER — Encounter (HOSPITAL_COMMUNITY): Payer: Self-pay | Admitting: Physician Assistant

## 2012-05-09 DIAGNOSIS — I498 Other specified cardiac arrhythmias: Secondary | ICD-10-CM

## 2012-05-09 LAB — BASIC METABOLIC PANEL
BUN: 20 mg/dL (ref 6–23)
GFR calc non Af Amer: 70 mL/min — ABNORMAL LOW (ref >90)
Glucose, Bld: 116 mg/dL — ABNORMAL HIGH (ref 70–99)
Potassium: 4 mEq/L (ref 3.5–5.1)

## 2012-05-09 MED ORDER — YOU HAVE A PACEMAKER BOOK
Freq: Once | Status: AC
  Administered 2012-05-09: 04:00:00
  Filled 2012-05-09: qty 1

## 2012-05-09 MED ORDER — CARVEDILOL 12.5 MG PO TABS: 12.5000 mg | ORAL_TABLET | Freq: Two times a day (BID) | ORAL | Status: DC

## 2012-05-09 NOTE — Progress Notes (Signed)
The patient is doing well today. At this time, he denies chest pain, shortness of breath, or any new concerns.  Mildly hypotensive overnight, now improved and back to baseline   .  ceFAZolin (ANCEF) IV  1 g Intravenous Q6H  . sodium chloride  3 mL Intravenous Q12H  . you have a pacemaker book   Does not apply Once      OBJECTIVE: Physical Exam: Filed Vitals:   05/09/12 0500 05/09/12 0600 05/09/12 0700 05/09/12 0800  BP: 95/58 94/60 92/57  74/73  Pulse: 66   62  Temp: 98.4 F (36.9 C)   97.7 F (36.5 C)  TempSrc: Oral   Oral  Resp:      Height:      Weight: 123 lb 14.4 oz (56.2 kg)     SpO2: 97%   97%    Intake/Output Summary (Last 24 hours) at 05/09/12 0854 Last data filed at 05/09/12 0847  Gross per 24 hour  Intake    710 ml  Output    450 ml  Net    260 ml    Telemetry reveals sinus rhythm with pacing  GEN- The patient is well appearing, alert and oriented x 3 today.   Head- normocephalic, atraumatic Eyes-  Sclera clear, conjunctiva pink Ears- hearing intact Oropharynx- clear Neck- supple, no JVP Lymph- no cervical lymphadenopathy Lungs- Clear to ausculation bilaterally, normal work of breathing Heart- Regular rate and rhythm, no murmurs, rubs or gallops, PMI not laterally displaced GI- soft, NT, ND, + BS Extremities- no clubbing, cyanosis, or edema Skin- pacemaker pocket without hematoma  LABS: Basic Metabolic Panel:  Recent Labs  19/14/78 0435  NA 139  K 4.0  CL 105  CO2 26  GLUCOSE 116*  BUN 20  CREATININE 0.88  CALCIUM 8.6   Liver Function Tests: No results found for this basename: AST, ALT, ALKPHOS, BILITOT, PROT, ALBUMIN,  in the last 72 hours No results found for this basename: LIPASE, AMYLASE,  in the last 72 hours CBC: No results found for this basename: WBC, NEUTROABS, HGB, HCT, MCV, PLT,  in the last 72 hours Cardiac Enzymes: No results found for this basename: CKTOTAL, CKMB, CKMBINDEX, TROPONINI,  in the last 72 hours BNP: No  components found with this basename: POCBNP,  D-Dimer: No results found for this basename: DDIMER,  in the last 72 hours Hemoglobin A1C: No results found for this basename: HGBA1C,  in the last 72 hours Fasting Lipid Panel: No results found for this basename: CHOL, HDL, LDLCALC, TRIG, CHOLHDL, LDLDIRECT,  in the last 72 hours Thyroid Function Tests: No results found for this basename: TSH, T4TOTAL, FREET3, T3FREE, THYROIDAB,  in the last 72 hours Anemia Panel: No results found for this basename: VITAMINB12, FOLATE, FERRITIN, TIBC, IRON, RETICCTPCT,  in the last 72 hours  RADIOLOGY: Dg Chest 2 View  05/09/2012  *RADIOLOGY REPORT*  Clinical Data: Pacemaker placement.  CHEST - 2 VIEW  Comparison: 05/08/2012 and 08/28/2010.  Findings: Trachea is midline.  Left subclavian pacemaker lead tips project over the right atrium, right ventricle and expected location of the coronary sinus.  Heart is mildly enlarged. Linear opacity with architectural distortion in the right midlung zone is unchanged from 08/28/2010, as is blunting of the right costophrenic angle.  Lungs are otherwise clear.  No pneumothorax.  IMPRESSION:  1.  Placement of a coronary sinus pacemaker lead without complicating feature. 2.  Pleural parenchymal scarring in the right hemithorax.   Original Report Authenticated By: Leanna Battles,  M.D.    Dg Chest 2 View  05/08/2012  *RADIOLOGY REPORT*  Clinical Data: Cardiac arrhythmia; hypertension  CHEST - 2 VIEW  Comparison: August 28, 2010  Findings: There appears to be a degree of underlying emphysema. There is scarring in the right mid lung with chronic blunting of the right costophrenic angle.  There is no edema or consolidation. Heart is mildly enlarged with pulmonary vascularity reflecting the underlying emphysematous change.  Pacemaker leads are attached to the right atrium right ventricle.  No adenopathy.  There is atherosclerotic change in the aorta.  IMPRESSION: Findings suggesting  underlying emphysema.  Scarring right lung.  No edema or consolidation.  Mild cardiomegaly.  Atherosclerotic change in the aorta.   Original Report Authenticated By: Bretta Bang, M.D.     ASSESSMENT AND PLAN:  Active Problems:   Systolic CHF, chronic   Bradycardia  1. Chronic systolic dysfunction/ bradycardia/ AV block Doing well today s/p upgrade of her device to a biv ICD  Routine wound care and follow-up  DC to home Resume home medicine   Hillis Range, MD 05/09/2012 8:54 AM

## 2012-05-09 NOTE — Op Note (Signed)
NAMEMarland Kitchen  Shepherd, Angela Shepherd NO.:  [id number]  MEDICAL RECORD NO.:  [id number]  LOCATION:  6524                         FACILITY:  [facility]  PHYSICIAN:  Hillis Range, MD       DATE OF BIRTH:  [birth year]  DATE OF PROCEDURE: DATE OF DISCHARGE:                              OPERATIVE REPORT   PREPROCEDURE DIAGNOSIS: 1. Nonischemic cardiomyopathy. 2. Chronic systolic dysfunction. 3. Hypertrophic cardiomyopathy. 4. Symptomatic bradycardia with second-degree AV block. 5. Pacemaker at elective replacement indicator.  POSTPROCEDURE DIAGNOSIS: 1. Nonischemic cardiomyopathy. 2. Chronic systolic dysfunction. 3. Hypertrophic cardiomyopathy. 4. Symptomatic bradycardia with second-degree AV block. 5. Pacemaker at elective replacement indicator.  INTRODUCTION:  Angela Shepherd is a very pleasant 62 year old female with a history of a nonischemic cardiomyopathy (ejection fraction 40%), chronic systolic dysfunction despite optimal medical therapy, hypertrophic cardiomyopathy, symptomatic bradycardia with a long first- degree AV block and second-degree AV block.  She recently has reached elective replacement indicator on her pacemaker.  Over the past year, she has had progressive difficulties with chronic systolic dysfunction despite an optimal medical therapy.  This is felt to likely be due to ventricular pacing.  She has a first-degree AV block of over 400 milliseconds as well as intermittent second-degree AV block for which she frequently requires ventricular pacing (greater than 99%).  She has been treated with an optimal medical regimen.  I had a long discussion with Dr. Gala Romney, her primary heart failure attending.  We both feel that she would benefit from upgrade of her pacemaker to a biventricular pacemaker at this time.  PROCEDURES: 1. Left upper extremity venography. 2. Pacemaker removal. 3. Biventricular pacemaker upgrade. 4. Skin pocket revision.  DESCRIPTION OF  PROCEDURE:  Informed written consent was obtained and the patient was brought to the electrophysiology lab in the fasting state. She was adequately sedated with intravenous Versed and fentanyl as outlined in the nursing report.  The patient's Medtronic Sigma SDR pacemaker was interrogated and confirmed to be at elective replacement indicator.  Her atrial lead was confirmed to be a Medtronic model 4524 (serial# [id number]) lead.  The right ventricular lead was confirmed to be a Medtronic model 4024 (serial# [id number]) lead.  These leads were implanted, July 12, 1992.  The left chest was prepped and draped in the usual sterile fashion by the EP lab staff.  The skin overlying the left deltopectoral region was infiltrated with lidocaine for local analgesia. A 4-cm incision was made over the existing pacemaker pocket.  The pacemaker was removed using a combination of sharp and blunt dissection. Electrocautery was required to assure hemostasis.  The pacemaker was disconnected from the leads and removed from the body.  The atrial and ventricular leads were examined thoroughly and their integrity was confirmed to be intact.  Atrial lead P-waves measured 4 mV with impedance of 440 ohms and a threshold of 0.7 V at 0.4 milliseconds. Right ventricular lead R-waves measured 9.8 mV with impedance of 430 ohms and a threshold of 1 volt at 0.4 milliseconds.  A venogram of the left upper extremity was performed by hand-injection of nonionic contrast.  This demonstrated a patent left axillary vein, which was moderate in size.  The previously placed leads appear to  be advanced previously through the left cephalic vein.  The left subclavian vein, however, was stenotic and nearly occluded along its distal portion.  The left axillary vein was cannulated with fluoroscopic visualization.  A Glidewire was required with multiple attempts to eventually across the stenotic portion of the lead.  Sequential dilatation  was then performed in order to obtain access to the left subclavian system.  A curved hexapolar Damato catheter was introduced through the left axillary vein and advanced into the right atrium.  A Medtronic MB2 guide was advanced over this catheter.  The hexapolar catheter was then advanced into the proximal portion of the coronary sinus.  The MB2 guide was advanced into the coronary sinus and a hexapolar catheter was then removed.  A selective venogram was performed with hand-injection of nonionic contrast during balloon inflation.  This demonstrated a moderate-sized coronary sinus body with only a single distal lateral branch observed.  The balloon tipped catheter was then removed.  A whisper CSJ wire was advanced through the MB2 guide into this distal lateral branch.  A [employer] QuickFlex Micro model [phone] (serial# [id number]), coronary sinus lead was advanced over the whisper CSJ wire into this distal lateral branch of the coronary sinus.  From this branch extensive mapping along the lateral wall of the left ventricle was performed.  Unfortunately, the patient had diaphragmatic stimulation pretty much throughout the lateral wall of the left ventricle from the base all the way to the distal apex when pacing at 5 volts.  In addition, pacing of the left ventricle revealed very high thresholds (predominantly greater than 5 volts) throughout the lateral wall of the left ventricle.  Finally, a position along the apical lateral wall of the left ventricle was found were pacing.  Numbers were satisfactory.  In this location, R-waves measured 11 mV with an impedance of 1375 ohms and a threshold of 1.7 volts at 1 millisecond. This was felt to likely represent the most optimal location for this patient's coronary sinus lead.  The MB2 guide was therefore carefully removed.  The lead was secured to the pectoralis fascia with a #2 silk sutures over the suture sleeve.  The pocket was then  revised to accommodate the new device.  All 3 leads were then connected to a [facility] model [id number] (serial# [id number]) pacemaker.  The pocket was irrigated with copious gentamicin solution.  The pacemaker was then placed into the pocket.  The pocket was then closed in two layers with 2-0 Vicryl suture for the subcutaneous and subcuticular layers.  Steri-Strips and a sterile dressing were then applied.  There were no early apparent complications.  CONCLUSIONS: 1. Successful pacemaker upgrade to a [employer] Anthem     biventricular pacemaker. 2. No early apparent complications.     Hillis Range, MD     JA/MEDQ  D:  05/08/2012  T:  05/09/2012  Job:  [id number]  cc:   Bevelyn Buckles. Bensimhon, MD

## 2012-05-09 NOTE — Discharge Summary (Signed)
Discharge Summary   Patient ID: Angela Shepherd MRN: [id number], DOB/AGE: 05/23/1950 62 y.o. Admit date: 05/08/2012 D/C date:     05/09/2012  Primary Cardiologist: CHF - Bensimhon, EP - JA  Primary Discharge Diagnoses:  1. Chronic systolic CHF due to NICM/bradycardia/AV block - s/p upgrade of device to SJM Anthem biventricular pacemaker 05/08/12 - initial PPM placed 1994 at [facility] with gen change 2002 2. Hypotension, acute on chronic - Lasix/lisinopril held on discharge, Coreg decreased this admission  Secondary Discharge Diagnoses:  1. HOCM 2. Prior tobacco abuse 3. Migraines vs cluster headaches 4. Hypokalemia 5. H/o pneumonia  Hospital Course: Angela Shepherd is a 62 y/o F with history of HOCM, bradycardia s/p PPM in the 90s at [facility], prior tobacco abuse, and CHF. She was admitted in December 2011 with acute HF. Echo with EF 30-35% and evidence of ASH with septum of 1.7 PW = 1.9. She was diuresed and scheduled for outpt cath. Cath December 2011: Normal coronaries. EF 40-45%. Central aortic pressure was 86/47 with a mean of 62. LV pressure is 74/13 with an EDP of 2. Right atrial pressure mean of 3. RV pressure 26/3 with an EDP of 5. PA pressure 28/5 with a mean of 16. Pulmonary capillary wedge pressure mean of 7. Fick cardiac output was 3.4 and cardiac index 2.1. Echo in March 2012: EF 50-55%. Grade 2 diastolic dysfunction. IVS 1.4cm. She was evaluated 05/01/12 in the HF clinic and an echo was ordered showing EF 40-45%. She presented to the EP clinic 05/02/12 and PPM battery was found to be at [facility]. Over the past year,  she has had progressive difficulties with chronic systolic dysfunction despite an optimal medical therapy. This was felt to likely be due to ventricular pacing. She has a first-degree AV block of over 400 milliseconds as well as intermittent second-degree AV block for which she frequently requires ventricular pacing (greater than 99%). Dr. Johney Shepherd had a long discussion  with Dr. Gala Shepherd, her primary CHF attending. They both felt she would benefit from upgrade of her pacemaker to a biventricular pacemaker. She was brought in for this procedure 05/08/12 and underwent successful upgrade of her device to a SJM Anthem biventricular pacemaker. Post-procedure she did develop asymptomatic hypotension. BP dropped intot he 60s-70s. She received a slow bolus of IVF with improvement in BP. The patient reports chronic hypotension but this is lower for her. Discussed with Dr. Johney Shepherd and then Dr. Gala Shepherd - we will discontinue Lasix & Lisinopril, decrease Coreg to 12.5mg  BID, and continue spironolactone. The patient was instructed to monitor BP at home and call Dr. Gala Shepherd if SBP <80 or if she develops symptoms of hypotension. Most recent BP 86/61 and she feels well. She is not to return to driving for 1 week, and only if BP is consistently stable. A work note was given for her to RTW 05/19/12. Dr. Johney Shepherd has seen and examined her and feels she is stable for discharge today.  Discharge Vitals: Blood pressure 74/73, pulse 62, temperature 97.7 F (36.5 C), temperature source Oral, resp. rate 18, height 5\' 2"  (1.575 m), weight 123 lb 14.4 oz (56.2 kg), SpO2 97.00%.  Labs: Lab Results  Component Value Date   WBC 8.0 05/02/2012   HGB 14.0 05/02/2012   HCT 40.5 05/02/2012   MCV 96.1 05/02/2012   PLT 197.0 05/02/2012     Recent Labs Lab 05/09/12 0435  NA 139  K 4.0  CL 105  CO2 26  BUN 20  CREATININE 0.88  CALCIUM 8.6  GLUCOSE 116*    Diagnostic Studies/Procedures   PROCEDURES:  1. Left upper extremity venography.  2. Pacemaker removal.  3. Biventricular pacemaker upgrade.  4. Skin pocket revision.  --> See report.  Dg Chest 2 View 05/09/2012  *RADIOLOGY REPORT*  Clinical Data: Pacemaker placement.  CHEST - 2 VIEW  Comparison: 05/08/2012 and 08/28/2010.  Findings: Trachea is midline.  Left subclavian pacemaker lead tips project over the right atrium, right ventricle and  expected location of the coronary sinus.  Heart is mildly enlarged. Linear opacity with architectural distortion in the right midlung zone is unchanged from 08/28/2010, as is blunting of the right costophrenic angle.  Lungs are otherwise clear.  No pneumothorax.  IMPRESSION:  1.  Placement of a coronary sinus pacemaker lead without complicating feature. 2.  Pleural parenchymal scarring in the right hemithorax.   Original Report Authenticated By: Leanna Battles, M.D.    Dg Chest 2 View 05/08/2012  *RADIOLOGY REPORT*  Clinical Data: Cardiac arrhythmia; hypertension  CHEST - 2 VIEW  Comparison: August 28, 2010  Findings: There appears to be a degree of underlying emphysema. There is scarring in the right mid lung with chronic blunting of the right costophrenic angle.  There is no edema or consolidation. Heart is mildly enlarged with pulmonary vascularity reflecting the underlying emphysematous change.  Pacemaker leads are attached to the right atrium right ventricle.  No adenopathy.  There is atherosclerotic change in the aorta.  IMPRESSION: Findings suggesting underlying emphysema.  Scarring right lung.  No edema or consolidation.  Mild cardiomegaly.  Atherosclerotic change in the aorta.   Original Report Authenticated By: Bretta Bang, M.D.     Discharge Medications     Medication List    STOP taking these medications       furosemide 40 MG tablet  Commonly known as:  LASIX     lisinopril 10 MG tablet  Commonly known as:  PRINIVIL,ZESTRIL      TAKE these medications       aspirin 81 MG chewable tablet  Chew 81 mg by mouth daily.     carvedilol 12.5 MG tablet  Commonly known as:  COREG  Take 1 tablet (12.5 mg total) by mouth 2 (two) times daily with a meal.     loratadine 10 MG tablet  Commonly known as:  CLARITIN  Take 10 mg by mouth daily.     nicotine 7 mg/24hr patch  Commonly known as:  NICODERM CQ - dosed in mg/24 hr  Place 1 patch onto the skin daily.     spironolactone 25  MG tablet  Commonly known as:  ALDACTONE  Take 12.5 mg by mouth daily.        Disposition   The patient will be discharged in stable condition to home. Discharge Orders   Future Appointments Provider Department Dept Phone   05/22/2012 2:30 PM [facility] [address] Main Office [place]) [phone]   05/22/2012 3:45 PM [facility] [phone]   08/13/2012 3:00 PM Hillis Range, MD [facility] Main Office [place]) [phone]   Future Orders Complete By Expires     Diet - low sodium heart healthy  As directed     Discharge instructions  As directed     Comments:      Monitor your blood pressure daily. If the top number (systolic blood pressure) is less than 80, call Dr.Bensimhon's office. If you feel dizzy, lightheaded, fatigued, short of breath,  or any other concerning symptoms, sit down and check your blood pressure and seek medical attention if necessary.  For patients with congestive heart failure, we give them these special instructions:  1. Follow a low-salt diet and watch your fluid intake. In general, you should not be taking in more than 2 liters of fluid per day (no more than 8 glasses per day). Some patients are restricted to less than 1.5 liters of fluid per day (no more than 6 glasses per day). This includes sources of water in foods like soup, coffee, tea, milk, etc. 2. Weigh yourself on the same scale at same time of day and keep a log. 3. Call your doctor: (Anytime you feel any of the following symptoms)  - 3-4 pound weight gain in 1-2 days or 2 pounds overnight  - Shortness of breath, with or without a dry hacking cough  - Swelling in the hands, feet or stomach  - If you have to sleep on extra pillows at night in order to breathe  IT IS IMPORTANT TO LET YOUR DOCTOR KNOW EARLY ON IF YOU ARE HAVING SYMPTOMS SO WE CAN HELP YOU!    Increase activity slowly  As directed     Comments:      Please  see attached sheet at the end of your After-Visit Summary for instructions on wound care, activity, and bathing.      Follow-up Information   Follow up with Arvilla Meres, MD On 05/22/2012. (at 3:45p  [employer] Code 0010)    Contact information:   [address] [place] [address] [phone]       Follow up with [facility] On 05/22/2012. (Pacer/wound check at 2:30pm (before your heart failure appointment with Dr. Gala Shepherd over at the CHF clinic))    Contact information:   [facility] [address] [place], [place] [address] [phone]       Follow up with Hillis Range, MD. (08/13/12 at 3pm)    Contact information:   [facility] [address] [place], [place] [address] [phone]        Duration of Discharge Encounter: Greater than 30 minutes including physician and PA time.  Signed, Ronie Spies PA-C 05/09/2012, 11:38 AM  Hillis Range, MD

## 2012-05-09 NOTE — Progress Notes (Signed)
Utilization Review Completed Shuntel Fishburn J. Natori Gudino, RN, BSN, NCM [phone]  

## 2012-05-12 ENCOUNTER — Telehealth: Payer: Self-pay | Admitting: *Deleted

## 2012-05-12 NOTE — Telephone Encounter (Signed)
TCM phone call to patient. Patient contacted regarding discharge from [facility] Patient understands to follow on 5/15 for wound check at [facility]. Patient understands discharge instructions. Patient understands medications and regiment. Patient understands to bring all medications to this visit.  She will leave bandage on her wound until she comes in for wound check. Advised to keep clean and dry.

## 2012-05-14 ENCOUNTER — Encounter: Payer: Self-pay | Admitting: Family Medicine

## 2012-05-14 ENCOUNTER — Ambulatory Visit (INDEPENDENT_AMBULATORY_CARE_PROVIDER_SITE_OTHER): Payer: 59 | Admitting: Family Medicine

## 2012-05-14 VITALS — BP 92/64 | HR 67 | Temp 98.0°F | Ht 61.5 in | Wt 125.8 lb

## 2012-05-14 DIAGNOSIS — M549 Dorsalgia, unspecified: Secondary | ICD-10-CM

## 2012-05-14 MED ORDER — CYCLOBENZAPRINE HCL 5 MG PO TABS: 5.0000 mg | ORAL_TABLET | Freq: Three times a day (TID) | ORAL | Status: DC | PRN

## 2012-05-14 MED ORDER — DICLOFENAC SODIUM 1 % TD GEL: 4.0000 g | Freq: Four times a day (QID) | TRANSDERMAL | Status: DC

## 2012-05-14 NOTE — Progress Notes (Signed)
  Subjective:    Patient ID: Angela Shepherd, female    DOB: [birth year], 62 y.o.   MRN: [id number]  HPI Back pain- sxs started ~1 year ago.  Located between shoulder blades and radiates up into neck.  Saw Ortho and did physical therapy.  Will have to lie down when pain is severe.  Since hospital d/c 05/09/12 has had nearly constant pain.  Not associated w/ movement- was previously worse w/ activity.  Improves w/ lying flat.  Pain described as a 'hot burning pain'.  Nauseates pt when pain occurs.  No weakness or numbness of UEs   Review of Systems For ROS see HPI     Objective:   Physical Exam  Vitals reviewed. Constitutional: She is oriented to person, place, and time. She appears well-developed and well-nourished. No distress.  Musculoskeletal: She exhibits no edema.  + TTP over trigger point in L trap, + paraspinal spasm L>R  Neurological: She is alert and oriented to person, place, and time. She has normal reflexes. No cranial nerve deficit. Coordination normal.  Skin: Skin is warm and dry.  Bandage in place over L chest wall  Psychiatric: She has a normal mood and affect. Her behavior is normal. Thought content normal.          Assessment & Plan:

## 2012-05-14 NOTE — Patient Instructions (Addendum)
Start the Flexeril 3x/day as needed for muscle spasm- this may cause drowsiness Use the voltaren gel directly on the painful spots- can use up to 4x/day HEAT! If no improvement in the next 10 days- please call ortho Call with any questions or concerns Hang in there!!!

## 2012-05-14 NOTE — Assessment & Plan Note (Signed)
New to provider.  Pt appears to have trap and paraspinal spasm- possibly from recent stay in hospital bed.  Start muscle relaxer.  No systemic NSAIDs due to recent cardiac procedure w/ increased bleeding/oozing.  Start topical Voltaren gel.  Heating pad prn.  Pt to call ortho if no improvement.  Will follow.

## 2012-05-15 ENCOUNTER — Ambulatory Visit: Payer: 59 | Admitting: Family Medicine

## 2012-05-22 ENCOUNTER — Encounter (HOSPITAL_COMMUNITY): Payer: Self-pay

## 2012-05-22 ENCOUNTER — Ambulatory Visit (HOSPITAL_COMMUNITY)
Admission: RE | Admit: 2012-05-22 | Discharge: 2012-05-22 | Disposition: A | Discharge status: Home / Self Care | Payer: 59 | Source: Ambulatory Visit | Attending: Internal Medicine | Admitting: Internal Medicine

## 2012-05-22 ENCOUNTER — Encounter: Payer: Self-pay | Admitting: *Deleted

## 2012-05-22 ENCOUNTER — Ambulatory Visit (INDEPENDENT_AMBULATORY_CARE_PROVIDER_SITE_OTHER): Payer: 59 | Admitting: *Deleted

## 2012-05-22 ENCOUNTER — Encounter: Payer: Self-pay | Admitting: Internal Medicine

## 2012-05-22 VITALS — BP 112/61 | HR 67 | Ht 62.0 in | Wt 124.4 lb

## 2012-05-22 DIAGNOSIS — I498 Other specified cardiac arrhythmias: Secondary | ICD-10-CM | POA: Insufficient documentation

## 2012-05-22 DIAGNOSIS — R001 Bradycardia, unspecified: Secondary | ICD-10-CM

## 2012-05-22 DIAGNOSIS — I509 Heart failure, unspecified: Secondary | ICD-10-CM

## 2012-05-22 DIAGNOSIS — I428 Other cardiomyopathies: Secondary | ICD-10-CM

## 2012-05-22 DIAGNOSIS — I421 Obstructive hypertrophic cardiomyopathy: Secondary | ICD-10-CM | POA: Insufficient documentation

## 2012-05-22 DIAGNOSIS — I10 Essential (primary) hypertension: Secondary | ICD-10-CM | POA: Insufficient documentation

## 2012-05-22 DIAGNOSIS — I5022 Chronic systolic (congestive) heart failure: Secondary | ICD-10-CM

## 2012-05-22 DIAGNOSIS — I519 Heart disease, unspecified: Secondary | ICD-10-CM | POA: Insufficient documentation

## 2012-05-22 LAB — PACEMAKER DEVICE OBSERVATION
AL THRESHOLD: 0.5 V
ATRIAL PACING PM: 21
BAMS-0001: 180 {beats}/min
BAMS-0003: 70 {beats}/min
RV LEAD IMPEDENCE PM: 400 Ohm
RV LEAD THRESHOLD: 1 V
VENTRICULAR PACING PM: 99

## 2012-05-22 NOTE — Addendum Note (Signed)
Encounter addended by: Noralee Space, RN on: 05/22/2012  3:40 PM<BR>     Documentation filed: Patient Instructions Section

## 2012-05-22 NOTE — Progress Notes (Signed)
Wound check ppm in clinic. Normal device function. ROV in 3 mths w/JA.

## 2012-05-22 NOTE — Assessment & Plan Note (Addendum)
She is doing great. NYHA I-II. Volume status looks great. Continue current regimen. Hopefully CRT will improve LV function. See back in 3 months with echo.

## 2012-05-22 NOTE — Progress Notes (Signed)
PCP: Dr. 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 She has a h/o HOCM, bradycardia s/p PPM in the 90s at [facility]. PMHx also notable for HTN and prior tobacco abuse (quit 02/2010).   Was admitted in December 2011 with acute HF. Echo with EF 30-35%, There was also evidence of ASH with septum of 1.7 PW = 1.9. Diuresed and scheduled for outpt cath.  Cath 12/2009: Normal coronaries. EF 40-45%.  Central aortic pressure was 86/47 with a mean of 62.  LV pressure is 74/13 with an EDP of 2.  Right atrial pressure mean of 3.  RV pressure 26/3 with an EDP of 5.  PA pressure 28/5 with a mean of 16.  Pulmonary capillary wedge pressure mean of 7.  Fick cardiac output was 3.4 and cardiac index 2.1.   Echo in March 2012:  EF 50-55%. Grade 2 diastolic dysfunction. IVS 1.4cm.  Echo 4/14: EF 40-45%   Was seen by EP and noted to have a high percentage of RV pacing (99%) and it was felt this was worsening LV function. Underwent upgrade to CRT-P on 5/1. In hospital BP dropped into 70s. Lasix & Lisinopril stopped, Coreg decreased to 12.5mg  BID, and spironolactone continued. At home was starting to gain fluid so she restarted all her previous meds and now back on her original doses.  Now feels good. Back to her walking program. Gets up at 4am to walk 30-60 mins on her treadmill. No edema, orthopnea or PND.   ROS: All systems negative except as listed in HPI, PMH and Problem List.  Past Medical History  Diagnosis Date  . Hypertrophic obstructive cardiomyopathy   . Hypokalemia   . Chronic systolic CHF (congestive heart failure)     a. EF 30-35% dx in 2011 -> most recent 40-45% by echo 04/2012. b. s/p upgrade to BiV-PPM 05/08/12. c. Med titration limited by hypotension.  . Bradycardia     a. initial PPM placed 1994 at [facility] with gen change 2002. b. 1*AVB & intermittent 2nd degree AVB + CHF --> upgrade to [facility]. Jude BiV PPM 05/08/12.  . Pneumonia 2008  . Cluster  headache syndrome   . Hypotension     Current Outpatient Prescriptions  Medication Sig Dispense Refill  . aspirin 81 MG chewable tablet Chew 81 mg by mouth daily.      . carvedilol (COREG) 12.5 MG tablet Take 1 tablet (12.5 mg total) by mouth 2 (two) times daily with a meal.      . cyclobenzaprine (FLEXERIL) 5 MG tablet Take 1 tablet (5 mg total) by mouth 3 (three) times daily as needed for muscle spasms.  30 tablet  0  . diclofenac sodium (VOLTAREN) 1 % GEL Apply 4 g topically 4 (four) times daily.  100 g  3  . Furosemide (LASIX PO) Take by mouth.      Marland Kitchen LISINOPRIL PO Take by mouth.      . loratadine (CLARITIN) 10 MG tablet Take 10 mg by mouth daily.      . nicotine (NICODERM CQ - DOSED IN MG/24 HR) 7 mg/24hr patch Place 1 patch onto the skin daily.      Marland Kitchen spironolactone (ALDACTONE) 25 MG tablet Take 12.5 mg by mouth daily.       No current facility-administered medications for this encounter.     PHYSICAL EXAM: Filed Vitals:   05/22/12 1510  BP: 112/61  Pulse: 67  Height: 5\' 2"  (  1.575 m)  Weight: 124 lb 6.4 oz (56.427 kg)    General: NAD  HEENT: normal Neck: supple. JVP flat. Carotids 2+ bilaterally; no bruits. No lymphadenopathy or thryomegaly appreciated. Cor: PMI normal. Regular rate & rhythm. No rubs, gallops or murmurs. PPM site looks good.  Lungs:  Abdomen: soft, nontender, nondistended. No hepatosplenomegaly. No bruits or masses. Good bowel sounds. Extremities: no cyanosis, clubbing, rash, edema Neuro: alert & orientedx3, cranial nerves grossly intact. Moves all 4 extremities w/o difficulty. Affect pleasant.     ASSESSMENT & PLAN:

## 2012-05-22 NOTE — Patient Instructions (Addendum)
We will contact you in 3 months to schedule your next appointment with an echocardiogram  

## 2012-06-20 ENCOUNTER — Encounter: Payer: Self-pay | Admitting: Internal Medicine

## 2012-08-13 ENCOUNTER — Encounter: Payer: Self-pay | Admitting: Internal Medicine

## 2012-08-13 ENCOUNTER — Ambulatory Visit (INDEPENDENT_AMBULATORY_CARE_PROVIDER_SITE_OTHER): Payer: 59 | Admitting: Internal Medicine

## 2012-08-13 VITALS — BP 138/80 | HR 72 | Ht 62.0 in | Wt 120.0 lb

## 2012-08-13 DIAGNOSIS — I498 Other specified cardiac arrhythmias: Secondary | ICD-10-CM

## 2012-08-13 DIAGNOSIS — R001 Bradycardia, unspecified: Secondary | ICD-10-CM

## 2012-08-13 DIAGNOSIS — I509 Heart failure, unspecified: Secondary | ICD-10-CM

## 2012-08-13 DIAGNOSIS — I5023 Acute on chronic systolic (congestive) heart failure: Secondary | ICD-10-CM

## 2012-08-13 DIAGNOSIS — I5022 Chronic systolic (congestive) heart failure: Secondary | ICD-10-CM

## 2012-08-13 LAB — PACEMAKER DEVICE OBSERVATION
AL THRESHOLD: 0.5 V
ATRIAL PACING PM: 25
BAMS-0001: 180 {beats}/min
DEVICE MODEL PM: 2936617
LV LEAD THRESHOLD: 0.75 V
RV LEAD AMPLITUDE: 8.5 mv
RV LEAD IMPEDENCE PM: 400 Ohm

## 2012-08-13 NOTE — Patient Instructions (Addendum)
Your physician wants you to follow-up in: 05/2013 You will receive a reminder letter in the mail two months in advance. If you don't receive a letter, please call our office to schedule the follow-up appointment.  Remote monitoring is used to monitor your Pacemaker or ICD from home. This monitoring reduces the number of office visits required to check your device to one time per year. It allows [place] to keep an eye on the functioning of your device to ensure it is working properly. You are scheduled for a device check from home on 11/17/12. You may send your transmission at any time that day. If you have a wireless device, the transmission will be sent automatically. After your physician reviews your transmission, you will receive a postcard with your next transmission date.

## 2012-08-13 NOTE — Progress Notes (Signed)
PCP: Neena Rhymes, MD Primary Cardiologist:  Dr Gala Romney  Angela Shepherd is a 62 y.o. female who presents today for routine electrophysiology followup.  Since her pacemaker upgrade to a CRT-P device, the patient reports doing very well.  Today, she denies symptoms of palpitations, chest pain, shortness of breath,  lower extremity edema, dizziness, presyncope, or syncope.  The patient is otherwise without complaint today.   Past Medical History  Diagnosis Date  . Hypertrophic obstructive cardiomyopathy   . Hypokalemia   . Chronic systolic CHF (congestive heart failure)     a. EF 30-35% dx in 2011 -> most recent 40-45% by echo 04/2012. b. s/p upgrade to BiV-PPM 05/08/12. c. Med titration limited by hypotension.  . Bradycardia     a. initial PPM placed 1994 at [facility] with gen change 2002. b. 1*AVB & intermittent 2nd degree AVB + CHF --> upgrade to [facility] Jude BiV PPM 05/08/12.  . Pneumonia 2008  . Cluster headache syndrome   . Hypotension    Past Surgical History  Procedure Laterality Date  . Tonsillectomy  ~ 1959  . Tubal ligation  1984?  Marland Kitchen Biv pacemaker generator change out  05/08/2012    upgrade to SJM Anthem (CRT-P) by Dr Johney Frame  . Pacemaker insertion  1994; 2002; 05/08/2012  . Insert / replace / remove pacemaker  1994; 2002; 05/08/2012    Current Outpatient Prescriptions  Medication Sig Dispense Refill  . aspirin 81 MG chewable tablet Chew 81 mg by mouth daily.      . carvedilol (COREG) 12.5 MG tablet Take 1 tablet (12.5 mg total) by mouth 2 (two) times daily with a meal.      . cyclobenzaprine (FLEXERIL) 5 MG tablet Take 1 tablet (5 mg total) by mouth 3 (three) times daily as needed for muscle spasms.  30 tablet  0  . diclofenac sodium (VOLTAREN) 1 % GEL Apply 4 g topically 4 (four) times daily.  100 g  3  . Furosemide (LASIX PO) Take by mouth.      Marland Kitchen LISINOPRIL PO Take by mouth.      . loratadine (CLARITIN) 10 MG tablet Take 10 mg by mouth daily.      . nicotine (NICODERM CQ -  DOSED IN MG/24 HR) 7 mg/24hr patch Place 1 patch onto the skin daily.      Marland Kitchen spironolactone (ALDACTONE) 25 MG tablet Take 12.5 mg by mouth daily.       No current facility-administered medications for this visit.    Physical Exam: Filed Vitals:   08/13/12 1446  BP: 138/80  Pulse: 72  Height: 5\' 2"  (1.575 m)  Weight: 120 lb (54.432 kg)    GEN- The patient is well appearing, alert and oriented x 3 today.   Head- normocephalic, atraumatic Eyes-  Sclera clear, conjunctiva pink Ears- hearing intact Oropharynx- clear Lungs- Clear to ausculation bilaterally, normal work of breathing Chest- pacemaker pocket is well healed Heart- Regular rate and rhythm, no murmurs, rubs or gallops, PMI not laterally displaced GI- soft, NT, ND, + BS Extremities- no clubbing, cyanosis, or edema  Pacemaker interrogation- reviewed in detail today,  See PACEART report  Assessment and Plan:  1. Second degree AV block/ symptomatic bradycardia Normal pacemaker function See Pace Art report No changes today  2. Chronic systolic dysfunction Clinically improved with CRT Echo scheduled for next week ekg today reveals sinus with BiV pacing  Return in 9 months Enroll in [employer]

## 2012-08-20 NOTE — Addendum Note (Signed)
Encounter addended by: Noralee Space, RN on: 08/20/2012  5:06 PM<BR>     Documentation filed: Orders

## 2012-08-21 ENCOUNTER — Ambulatory Visit (HOSPITAL_COMMUNITY)
Admission: RE | Admit: 2012-08-21 | Discharge: 2012-08-21 | Disposition: A | Discharge status: Home / Self Care | Payer: 59 | Source: Ambulatory Visit | Attending: Internal Medicine | Admitting: Internal Medicine

## 2012-08-21 DIAGNOSIS — I498 Other specified cardiac arrhythmias: Secondary | ICD-10-CM | POA: Insufficient documentation

## 2012-08-21 DIAGNOSIS — Z87891 Personal history of nicotine dependence: Secondary | ICD-10-CM | POA: Insufficient documentation

## 2012-08-21 DIAGNOSIS — I509 Heart failure, unspecified: Secondary | ICD-10-CM | POA: Insufficient documentation

## 2012-08-21 DIAGNOSIS — I079 Rheumatic tricuspid valve disease, unspecified: Secondary | ICD-10-CM | POA: Insufficient documentation

## 2012-08-21 DIAGNOSIS — I5022 Chronic systolic (congestive) heart failure: Secondary | ICD-10-CM

## 2012-08-21 DIAGNOSIS — I10 Essential (primary) hypertension: Secondary | ICD-10-CM | POA: Insufficient documentation

## 2012-08-21 DIAGNOSIS — J449 Chronic obstructive pulmonary disease, unspecified: Secondary | ICD-10-CM | POA: Insufficient documentation

## 2012-08-21 DIAGNOSIS — I369 Nonrheumatic tricuspid valve disorder, unspecified: Secondary | ICD-10-CM

## 2012-08-21 DIAGNOSIS — J4489 Other specified chronic obstructive pulmonary disease: Secondary | ICD-10-CM | POA: Insufficient documentation

## 2012-08-21 DIAGNOSIS — I428 Other cardiomyopathies: Secondary | ICD-10-CM | POA: Insufficient documentation

## 2012-08-21 NOTE — Progress Notes (Signed)
*  PRELIMINARY RESULTS* Echocardiogram 2D Echocardiogram has been performed.  Angela Shepherd 08/21/2012, 2:23 PM

## 2012-08-25 ENCOUNTER — Other Ambulatory Visit: Payer: Self-pay | Admitting: Internal Medicine

## 2012-09-29 ENCOUNTER — Ambulatory Visit (HOSPITAL_COMMUNITY)
Admission: RE | Admit: 2012-09-29 | Discharge: 2012-09-29 | Disposition: A | Discharge status: Home / Self Care | Payer: 59 | Source: Ambulatory Visit | Attending: Internal Medicine | Admitting: Internal Medicine

## 2012-09-29 ENCOUNTER — Encounter (HOSPITAL_COMMUNITY): Payer: Self-pay

## 2012-09-29 VITALS — BP 94/60 | HR 65 | Ht 62.0 in | Wt 121.8 lb

## 2012-09-29 DIAGNOSIS — I5022 Chronic systolic (congestive) heart failure: Secondary | ICD-10-CM

## 2012-09-29 DIAGNOSIS — I509 Heart failure, unspecified: Secondary | ICD-10-CM | POA: Insufficient documentation

## 2012-09-29 DIAGNOSIS — I5042 Chronic combined systolic (congestive) and diastolic (congestive) heart failure: Secondary | ICD-10-CM | POA: Insufficient documentation

## 2012-09-29 DIAGNOSIS — I5032 Chronic diastolic (congestive) heart failure: Secondary | ICD-10-CM

## 2012-09-29 LAB — BASIC METABOLIC PANEL
BUN: 22 mg/dL (ref 6–23)
Chloride: 98 mEq/L (ref 96–112)
Creatinine, Ser: 0.83 mg/dL (ref 0.50–1.10)
GFR calc Af Amer: 87 mL/min — ABNORMAL LOW (ref >90)
Glucose, Bld: 98 mg/dL (ref 70–99)

## 2012-09-29 NOTE — Patient Instructions (Addendum)
Lab today  We will contact you in 4 months to schedule your next appointment.  

## 2012-09-30 NOTE — Progress Notes (Signed)
Patient ID: Angela Shepherd, female   DOB: 03/07/1950, 62 y.o.   MRN: [id number] PCP: Dr. 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 She has a h/o HOCM, bradycardia s/p PPM in the 90s at [facility]. PMHx also notable for HTN and prior tobacco abuse (quit 02/2010).   Was admitted in December 2011 with acute HF. Echo with EF 30-35%, There was also evidence of ASH with septum of 1.7 PW = 1.9. Diuresed and scheduled for outpt cath.  Cath 12/2009: Normal coronaries. EF 40-45%.  Central aortic pressure was 86/47 with a mean of 62.  LV pressure is 74/13 with an EDP of 2.  Right atrial pressure mean of 3.  RV pressure 26/3 with an EDP of 5.  PA pressure 28/5 with a mean of 16.  Pulmonary capillary wedge pressure mean of 7.  Fick cardiac output was 3.4 and cardiac index 2.1.   Echo in March 2012:  EF 50-55%. Grade 2 diastolic dysfunction. IVS 1.4cm.  Echo 4/14: EF 40-45%   Was seen by EP and noted to have a high percentage of RV pacing (99%) and it was felt this was worsening LV function. Underwent upgrade to CRT-P in 5/14.  Echo in 8/14 showed EF 45-50% with mild LVH.   Now feels good. Walking 30-60 min/day on treadmill without dyspnea.  Short of breath walking up stairs or carrying a heavy load.  No lightheadedness.  She takes Lasix prn, using about 2-3 times/week.  No orthopnea or PND. Weight is down 3 lbs.   Labs (5/14): K 4, creatinine 0.88  SH: Nonsmoker, works at [employer], lives in [place].   ROS: All systems negative except as listed in HPI, PMH and Problem List.  Past Medical History  Diagnosis Date  . Hypertrophic obstructive cardiomyopathy   . Hypokalemia   . Chronic systolic CHF (congestive heart failure)     a. EF 30-35% dx in 2011 -> most recent 40-45% by echo 04/2012. b. s/p upgrade to BiV-PPM 05/08/12. c. Med titration limited by hypotension.  . Bradycardia     a. initial PPM placed 1994 at [facility] with gen change 2002.  b. 1*AVB & intermittent 2nd degree AVB + CHF --> upgrade to [facility]. Jude BiV PPM 05/08/12.  . Pneumonia 2008  . Cluster headache syndrome   . Hypotension     Current Outpatient Prescriptions  Medication Sig Dispense Refill  . aspirin 81 MG chewable tablet Chew 81 mg by mouth daily.      . carvedilol (COREG) 12.5 MG tablet Take 1 tablet (12.5 mg total) by mouth 2 (two) times daily with a meal.      . cyclobenzaprine (FLEXERIL) 5 MG tablet Take 1 tablet (5 mg total) by mouth 3 (three) times daily as needed for muscle spasms.  30 tablet  0  . diclofenac sodium (VOLTAREN) 1 % GEL Apply 4 g topically 4 (four) times daily.  100 g  3  . furosemide (LASIX) 40 MG tablet TAKE 1 TABLET BY MOUTH DAILY AS NEEDED.  90 tablet  3  . LISINOPRIL PO Take 10 mg by mouth daily.       Marland Kitchen loratadine (CLARITIN) 10 MG tablet Take 10 mg by mouth daily.      . nicotine (NICODERM CQ - DOSED IN MG/24 HR) 7 mg/24hr patch Place 1 patch onto the skin daily.      Marland Kitchen spironolactone (ALDACTONE) 25 MG tablet Take 12.5 mg by  mouth daily.       No current facility-administered medications for this encounter.     PHYSICAL EXAM: Filed Vitals:   09/29/12 1155  BP: 94/60  Pulse: 65  Height: 5\' 2"  (1.575 m)  Weight: 121 lb 12.8 oz (55.248 kg)  SpO2: 99%    General: NAD  HEENT: normal Neck: supple. JVP flat. Carotids 2+ bilaterally; no bruits. No lymphadenopathy or thryomegaly appreciated. Cor: PMI normal. Regular rate & rhythm. No rubs, gallops or murmurs. PPM site looks good.  Lungs:  Abdomen: soft, nontender, nondistended. No hepatosplenomegaly. No bruits or masses. Good bowel sounds. Extremities: no cyanosis, clubbing, rash, edema Neuro: alert & orientedx3, cranial nerves grossly intact. Moves all 4 extremities w/o difficulty. Affect pleasant.     ASSESSMENT & PLAN: 1. Chronic systolic CHF: EF 40-45% after CRT-P upgrade (mild improvement).  NYHA class II symptoms, no volume overload.   - Continue Coreg, lisinopril,  and spironolactone at current doses.  - BMET today.  2. Symptomatic bradycardia: PCM in place, follows with Dr. Johney Frame. 3. HOCM: By history.  No significant murmur on exam.  No mention of outflow tract gradient or asymmetric septal hypertophy on last echo.   Followup in 4 months.   Marca Ancona 09/30/2012

## 2012-10-02 ENCOUNTER — Telehealth: Payer: Self-pay | Admitting: Family Medicine

## 2012-10-02 ENCOUNTER — Other Ambulatory Visit: Payer: Self-pay | Admitting: Internal Medicine

## 2012-10-02 ENCOUNTER — Other Ambulatory Visit (HOSPITAL_COMMUNITY): Payer: Self-pay | Admitting: Cardiology

## 2012-10-02 NOTE — Telephone Encounter (Signed)
Ok for pneumonia- may want to check w/ her insurance company prior to shingles due to the cost of the vaccine if it's not covered.  But good for both from our end

## 2012-10-02 NOTE — Telephone Encounter (Signed)
Please advise pt

## 2012-10-02 NOTE — Telephone Encounter (Signed)
Patient scheduled an OV on Mychart for shingles and Pneumonia injections. Is patient okay to have these? Want to make sure before I call her to reschedule as nurse visit. Please advise.

## 2012-10-02 NOTE — Telephone Encounter (Signed)
Ok for both injections? Pt has [employer].

## 2012-10-21 ENCOUNTER — Other Ambulatory Visit: Payer: Self-pay | Admitting: Internal Medicine

## 2012-10-22 ENCOUNTER — Ambulatory Visit: Payer: Self-pay | Admitting: Family Medicine

## 2012-10-22 ENCOUNTER — Ambulatory Visit (INDEPENDENT_AMBULATORY_CARE_PROVIDER_SITE_OTHER): Payer: 59 | Admitting: *Deleted

## 2012-10-22 DIAGNOSIS — Z2911 Encounter for prophylactic immunotherapy for respiratory syncytial virus (RSV): Secondary | ICD-10-CM

## 2012-10-22 DIAGNOSIS — Z23 Encounter for immunization: Secondary | ICD-10-CM

## 2012-11-13 ENCOUNTER — Other Ambulatory Visit: Payer: Self-pay

## 2012-11-17 ENCOUNTER — Encounter: Payer: 59 | Admitting: *Deleted

## 2012-11-17 ENCOUNTER — Encounter: Payer: Self-pay | Admitting: Internal Medicine

## 2012-11-17 DIAGNOSIS — I5022 Chronic systolic (congestive) heart failure: Secondary | ICD-10-CM

## 2012-11-17 LAB — MDC_IDC_ENUM_SESS_TYPE_REMOTE
Battery Voltage: 2.98 V
Brady Statistic AP VS Percent: 1 %
Brady Statistic AS VS Percent: 1 %
Brady Statistic RA Percent Paced: 33 %
Date Time Interrogation Session: 20141110113002
Implantable Pulse Generator Model: 3210
Implantable Pulse Generator Serial Number: 2936617
Lead Channel Impedance Value: 380 Ohm
Lead Channel Impedance Value: 840 Ohm
Lead Channel Pacing Threshold Amplitude: 0.75 V
Lead Channel Pacing Threshold Amplitude: 1 V
Lead Channel Pacing Threshold Pulse Width: 0.4 ms
Lead Channel Pacing Threshold Pulse Width: 1 ms
Lead Channel Sensing Intrinsic Amplitude: 1.8 mV
Lead Channel Sensing Intrinsic Amplitude: 8.2 mV
Lead Channel Setting Pacing Amplitude: 1.75 V
Lead Channel Setting Pacing Amplitude: 2 V
Lead Channel Setting Pacing Pulse Width: 0.4 ms
Lead Channel Setting Sensing Sensitivity: 2 mV

## 2012-11-21 ENCOUNTER — Encounter: Payer: Self-pay | Admitting: Family Medicine

## 2012-11-23 ENCOUNTER — Encounter: Payer: Self-pay | Admitting: Internal Medicine

## 2012-11-23 ENCOUNTER — Other Ambulatory Visit: Payer: Self-pay | Admitting: Internal Medicine

## 2012-11-26 ENCOUNTER — Encounter: Payer: Self-pay | Admitting: *Deleted

## 2012-11-28 ENCOUNTER — Ambulatory Visit (INDEPENDENT_AMBULATORY_CARE_PROVIDER_SITE_OTHER): Payer: 59 | Admitting: Family Medicine

## 2012-11-28 ENCOUNTER — Encounter: Payer: Self-pay | Admitting: Family Medicine

## 2012-11-28 ENCOUNTER — Other Ambulatory Visit: Payer: Self-pay | Admitting: Family Medicine

## 2012-11-28 VITALS — BP 96/72 | HR 72 | Temp 98.1°F | Resp 16 | Wt 124.5 lb

## 2012-11-28 DIAGNOSIS — I10 Essential (primary) hypertension: Secondary | ICD-10-CM

## 2012-11-28 DIAGNOSIS — E785 Hyperlipidemia, unspecified: Secondary | ICD-10-CM

## 2012-11-28 LAB — CBC WITH DIFFERENTIAL/PLATELET
Basophils Absolute: 0 10*3/uL (ref 0.0–0.1)
Basophils Relative: 0.7 % (ref 0.0–3.0)
Eosinophils Absolute: 0.2 10*3/uL (ref 0.0–0.7)
Hemoglobin: 14 g/dL (ref 12.0–15.0)
Lymphocytes Relative: 33.7 % (ref 12.0–46.0)
MCHC: 35.2 g/dL (ref 30.0–36.0)
MCV: 95.3 fl (ref 78.0–100.0)
Monocytes Relative: 5.5 % (ref 3.0–12.0)
Neutro Abs: 3.8 10*3/uL (ref 1.4–7.7)
Neutrophils Relative %: 57.3 % (ref 43.0–77.0)
RBC: 4.18 Mil/uL (ref 3.87–5.11)

## 2012-11-28 LAB — HEPATIC FUNCTION PANEL
AST: 21 U/L (ref 0–37)
Alkaline Phosphatase: 57 U/L (ref 39–117)
Bilirubin, Direct: 0 mg/dL (ref 0.0–0.3)
Total Protein: 7.4 g/dL (ref 6.0–8.3)

## 2012-11-28 LAB — LIPID PANEL
HDL: 75.8 mg/dL (ref >39.00)
Total CHOL/HDL Ratio: 3
Triglycerides: 53 mg/dL (ref 0.0–149.0)

## 2012-11-28 LAB — LDL CHOLESTEROL, DIRECT: Direct LDL: 148.9 mg/dL

## 2012-11-28 MED ORDER — ATORVASTATIN CALCIUM 10 MG PO TABS: 10.0000 mg | ORAL_TABLET | Freq: Every day | ORAL | Status: DC

## 2012-11-28 NOTE — Assessment & Plan Note (Signed)
Chronic problem.  Excellent control.  Asymptomatic.  Check labs to risk stratify.  Will follow.

## 2012-11-28 NOTE — Patient Instructions (Signed)
Follow up as needed We'll notify you of your lab results and make any changes if needed Keep up the good work! Call with any questions or concerns Happy Holidays!

## 2012-11-28 NOTE — Progress Notes (Signed)
  Subjective:    Patient ID: Angela Shepherd, female    DOB: 05-15-1950, 62 y.o.   MRN: [id number]  HPI Pre visit review using our clinic review tool, if applicable. No additional management support is needed unless otherwise documented below in the visit note.  HTN- chronic problem, excellent control on Coreg, Lasix, Lisinopril, Spironolactone.  No CP, SOB, HAs, visual changes, edema.  UTD on cardiology visits.   Review of Systems For ROS see HPI     Objective:   Physical Exam  Vitals reviewed. Constitutional: She is oriented to person, place, and time. She appears well-developed and well-nourished. No distress.  HENT:  Head: Normocephalic and atraumatic.  Eyes: Conjunctivae and EOM are normal. Pupils are equal, round, and reactive to light.  Neck: Normal range of motion. Neck supple. No thyromegaly present.  Cardiovascular: Normal rate, regular rhythm, normal heart sounds and intact distal pulses.   No murmur heard. Pulmonary/Chest: Effort normal and breath sounds normal. No respiratory distress.  Abdominal: Soft. She exhibits no distension. There is no tenderness.  Musculoskeletal: She exhibits no edema.  Lymphadenopathy:    She has no cervical adenopathy.  Neurological: She is alert and oriented to person, place, and time.  Skin: Skin is warm and dry.  Psychiatric: She has a normal mood and affect. Her behavior is normal.          Assessment & Plan:

## 2012-12-23 ENCOUNTER — Other Ambulatory Visit (HOSPITAL_COMMUNITY): Payer: Self-pay | Admitting: Cardiology

## 2013-01-21 ENCOUNTER — Telehealth: Payer: Self-pay

## 2013-01-21 NOTE — Telephone Encounter (Signed)
Medication and allergies:  Reviewed and updated  90 day supply/mail order: n/a Local pharmacy:  [facility]   Immunizations due:  UTD   A/P: No changes to personal, family history or past surgical hx PAP-05/04/11-negative [facility]CS-06/07/11-Dr. Pyrtle-multiple polyps-mixed, some adenomatous MMG-06/01/11-[facility]-neg BD-06/01/11-osteopenia Flu-10/22/12 Tdap-05/04/11 Shingles-10/22/12  To Discuss with Provider: Wants to discuss husband's medicines (Pt. Is 9:30 am appt.)

## 2013-01-23 ENCOUNTER — Ambulatory Visit (HOSPITAL_BASED_OUTPATIENT_CLINIC_OR_DEPARTMENT_OTHER)
Admission: RE | Admit: 2013-01-23 | Discharge: 2013-01-23 | Disposition: A | Discharge status: Home / Self Care | Payer: 59 | Source: Ambulatory Visit | Attending: Family Medicine | Admitting: Family Medicine

## 2013-01-23 ENCOUNTER — Encounter: Payer: Self-pay | Admitting: Family Medicine

## 2013-01-23 ENCOUNTER — Ambulatory Visit (INDEPENDENT_AMBULATORY_CARE_PROVIDER_SITE_OTHER): Payer: 59 | Admitting: Family Medicine

## 2013-01-23 VITALS — BP 118/80 | HR 75 | Temp 98.1°F | Resp 16 | Ht 61.75 in | Wt 127.0 lb

## 2013-01-23 DIAGNOSIS — J441 Chronic obstructive pulmonary disease with (acute) exacerbation: Secondary | ICD-10-CM

## 2013-01-23 DIAGNOSIS — J4489 Other specified chronic obstructive pulmonary disease: Secondary | ICD-10-CM | POA: Insufficient documentation

## 2013-01-23 DIAGNOSIS — I10 Essential (primary) hypertension: Secondary | ICD-10-CM

## 2013-01-23 DIAGNOSIS — F411 Generalized anxiety disorder: Secondary | ICD-10-CM

## 2013-01-23 DIAGNOSIS — J449 Chronic obstructive pulmonary disease, unspecified: Secondary | ICD-10-CM | POA: Insufficient documentation

## 2013-01-23 DIAGNOSIS — I509 Heart failure, unspecified: Secondary | ICD-10-CM | POA: Insufficient documentation

## 2013-01-23 DIAGNOSIS — Z950 Presence of cardiac pacemaker: Secondary | ICD-10-CM | POA: Insufficient documentation

## 2013-01-23 LAB — HEPATIC FUNCTION PANEL
ALBUMIN: 4.4 g/dL (ref 3.5–5.2)
ALK PHOS: 60 U/L (ref 39–117)
ALT: 17 U/L (ref 0–35)
AST: 19 U/L (ref 0–37)
Bilirubin, Direct: 0.1 mg/dL (ref 0.0–0.3)
Indirect Bilirubin: 0.4 mg/dL (ref 0.0–0.9)
Total Bilirubin: 0.5 mg/dL (ref 0.3–1.2)
Total Protein: 6.9 g/dL (ref 6.0–8.3)

## 2013-01-23 LAB — BASIC METABOLIC PANEL
BUN: 15 mg/dL (ref 6–23)
CALCIUM: 9.2 mg/dL (ref 8.4–10.5)
CO2: 24 mEq/L (ref 19–32)
Chloride: 105 mEq/L (ref 96–112)
Creat: 0.81 mg/dL (ref 0.50–1.10)
GLUCOSE: 90 mg/dL (ref 70–99)
POTASSIUM: 4.3 meq/L (ref 3.5–5.3)
SODIUM: 139 meq/L (ref 135–145)

## 2013-01-23 LAB — CBC WITH DIFFERENTIAL/PLATELET
BASOS PCT: 0 % (ref 0–1)
Basophils Absolute: 0 10*3/uL (ref 0.0–0.1)
Eosinophils Absolute: 0 10*3/uL (ref 0.0–0.7)
Eosinophils Relative: 0 % (ref 0–5)
HCT: 36.6 % (ref 36.0–46.0)
Hemoglobin: 12.7 g/dL (ref 12.0–15.0)
Lymphocytes Relative: 8 % — ABNORMAL LOW (ref 12–46)
Lymphs Abs: 0.9 10*3/uL (ref 0.7–4.0)
MCH: 32.6 pg (ref 26.0–34.0)
MCHC: 34.7 g/dL (ref 30.0–36.0)
MCV: 94.1 fL (ref 78.0–100.0)
MONOS PCT: 4 % (ref 3–12)
Monocytes Absolute: 0.5 10*3/uL (ref 0.1–1.0)
NEUTROS ABS: 10.1 10*3/uL — AB (ref 1.7–7.7)
NEUTROS PCT: 88 % — AB (ref 43–77)
PLATELETS: 212 10*3/uL (ref 150–400)
RBC: 3.89 MIL/uL (ref 3.87–5.11)
RDW: 13.8 % (ref 11.5–15.5)
WBC: 11.5 10*3/uL — ABNORMAL HIGH (ref 4.0–10.5)

## 2013-01-23 LAB — LIPID PANEL
Cholesterol: 148 mg/dL (ref 0–200)
HDL: 68 mg/dL (ref >39)
LDL CALC: 68 mg/dL (ref 0–99)
TRIGLYCERIDES: 59 mg/dL (ref <150)
Total CHOL/HDL Ratio: 2.2 Ratio
VLDL: 12 mg/dL (ref 0–40)

## 2013-01-23 MED ORDER — PREDNISONE 10 MG PO TABS: ORAL_TABLET | ORAL | Status: DC

## 2013-01-23 MED ORDER — IPRATROPIUM-ALBUTEROL 0.5-2.5 (3) MG/3ML IN SOLN
3.0000 mL | Freq: Once | RESPIRATORY_TRACT | Status: AC
  Administered 2013-01-23: 3 mL via RESPIRATORY_TRACT

## 2013-01-23 MED ORDER — ALPRAZOLAM 0.25 MG PO TABS: 0.2500 mg | ORAL_TABLET | Freq: Two times a day (BID) | ORAL | Status: DC | PRN

## 2013-01-23 MED ORDER — AMOXICILLIN 875 MG PO TABS: 875.0000 mg | ORAL_TABLET | Freq: Two times a day (BID) | ORAL | Status: DC

## 2013-01-23 MED ORDER — ALBUTEROL SULFATE HFA 108 (90 BASE) MCG/ACT IN AERS: 2.0000 | INHALATION_SPRAY | RESPIRATORY_TRACT | Status: DC | PRN

## 2013-01-23 NOTE — Patient Instructions (Signed)
Follow up in 1 month to recheck anxiety Go to [place] and get your chest xray Start the Amoxicillin twice daily for possible COPD exacerbation Use the Proair as your rescue inhaler- 2 puffs every 4 hrs as needed Use the Qvar daily to control your symptoms- 1 puff twice daily We'll notify you of your lab results and make any changes if needed Call with any questions or concerns Hang in there!!

## 2013-01-23 NOTE — Progress Notes (Signed)
Pre visit review using our clinic review tool, if applicable. No additional management support is needed unless otherwise documented below in the visit note. 

## 2013-01-23 NOTE — Progress Notes (Signed)
   Subjective:    Patient ID: Angela Shepherd, female    DOB: [birth year], 63 y.o.   MRN: [id number]  HPI SOB- pt reports increased difficulty breathing this week, 'it feels like it did when i had PNA before'.  Using albuterol inhaler as needed, taking husband's left over prednisone.  Hx of COPD but has not had to use inhaler in 2 yrs.  Pt doesn't feel this is heart related- no swelling.  No CP.  + crackles and wheezing.  No fevers but night sweats this week.  + sick contacts.  + nasal congestion, no sinus pain/pressure.  No ear pain.  Very anxious- concerned about husband's health- 'i'm all he's got, i need to get better'.   Review of Systems For ROS see HPI     Objective:   Physical Exam  Vitals reviewed. Constitutional: She is oriented to person, place, and time. She appears well-developed and well-nourished. She appears distressed (anxious, short of breath).  HENT:  Head: Normocephalic and atraumatic.  Nose: Nose normal.  Mouth/Throat: Oropharynx is clear and moist.  No TTP over sinuses TMs WNL bilaterally  Neck: Normal range of motion. Neck supple.  Cardiovascular: Normal rate, regular rhythm, normal heart sounds and intact distal pulses.   Pulmonary/Chest:  Increased work of breathing Shallow, rapid breaths Diffuse expiratory wheezes w/ poor air movement- improved s/p neb tx  Musculoskeletal: She exhibits no edema.  Lymphadenopathy:    She has no cervical adenopathy.  Neurological: She is alert and oriented to person, place, and time.  Skin: Skin is warm and dry.  Psychiatric:  Anxious, tearful          Assessment & Plan:

## 2013-01-24 LAB — TSH: TSH: 0.477 u[IU]/mL (ref 0.350–4.500)

## 2013-01-25 DIAGNOSIS — F419 Anxiety disorder, unspecified: Secondary | ICD-10-CM

## 2013-01-25 DIAGNOSIS — F32A Depression, unspecified: Secondary | ICD-10-CM | POA: Insufficient documentation

## 2013-01-25 DIAGNOSIS — F329 Major depressive disorder, single episode, unspecified: Secondary | ICD-10-CM | POA: Insufficient documentation

## 2013-01-25 NOTE — Assessment & Plan Note (Signed)
Adequate control.  Difficult to assess sxs given pt's current COPD exacerbation.  Check labs.  Will follow.

## 2013-01-25 NOTE — Assessment & Plan Note (Addendum)
New.  Pt currently having COPD exacerbation which is worse w/ her current level of anxiety.  Start abx.  Daily controller inhaler.  Rescue inhaler prn.  Reviewed supportive care and red flags that should prompt return.  Pt expressed understanding and is in agreement w/ plan.

## 2013-01-25 NOTE — Assessment & Plan Note (Signed)
New.  Worsening w/ husband's current health issues.  Impacting/compounding her COPD.  Start low dose xanax prn.

## 2013-02-02 ENCOUNTER — Telehealth: Payer: Self-pay | Admitting: *Deleted

## 2013-02-02 NOTE — Telephone Encounter (Signed)
02/02/2013 Received fax for FMLA paperwork to care for husband, Parker Sawatzky.  Attached billing form and put in Sandra's folder.  bw

## 2013-02-03 NOTE — Telephone Encounter (Signed)
Placed FMLA forms in provider's folder as this is an initial request.

## 2013-02-11 ENCOUNTER — Ambulatory Visit (HOSPITAL_COMMUNITY)
Admission: RE | Admit: 2013-02-11 | Discharge: 2013-02-11 | Disposition: A | Discharge status: Home / Self Care | Payer: 59 | Source: Ambulatory Visit | Attending: Internal Medicine | Admitting: Internal Medicine

## 2013-02-11 ENCOUNTER — Encounter (HOSPITAL_COMMUNITY): Payer: Self-pay

## 2013-02-11 VITALS — BP 106/68 | HR 86 | Resp 16 | Wt 124.0 lb

## 2013-02-11 DIAGNOSIS — I5032 Chronic diastolic (congestive) heart failure: Secondary | ICD-10-CM

## 2013-02-11 DIAGNOSIS — I5022 Chronic systolic (congestive) heart failure: Secondary | ICD-10-CM

## 2013-02-11 DIAGNOSIS — Z87891 Personal history of nicotine dependence: Secondary | ICD-10-CM | POA: Insufficient documentation

## 2013-02-11 DIAGNOSIS — I421 Obstructive hypertrophic cardiomyopathy: Secondary | ICD-10-CM | POA: Insufficient documentation

## 2013-02-11 DIAGNOSIS — I498 Other specified cardiac arrhythmias: Secondary | ICD-10-CM | POA: Insufficient documentation

## 2013-02-11 DIAGNOSIS — Z7982 Long term (current) use of aspirin: Secondary | ICD-10-CM | POA: Insufficient documentation

## 2013-02-11 DIAGNOSIS — I509 Heart failure, unspecified: Secondary | ICD-10-CM

## 2013-02-11 DIAGNOSIS — I10 Essential (primary) hypertension: Secondary | ICD-10-CM | POA: Insufficient documentation

## 2013-02-11 DIAGNOSIS — Z79899 Other long term (current) drug therapy: Secondary | ICD-10-CM | POA: Insufficient documentation

## 2013-02-11 NOTE — Progress Notes (Signed)
Patient ID: Angela Shepherd, female   DOB: [birth year], 63 y.o.   MRN: [id number] PCP: Dr. 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 She has a h/o HOCM, bradycardia s/p PPM in the 90s at [facility] PMHx also notable for HTN and prior tobacco abuse (quit 02/2010).   Was admitted in December 2011 with acute HF. Echo with EF 30-35%, There was also evidence of ASH with septum of 1.7 PW = 1.9. Diuresed and scheduled for outpt cath.  Cath 12/2009: Normal coronaries. EF 40-45%.  Central aortic pressure was 86/47 with a mean of 62.  LV pressure is 74/13 with an EDP of 2.  Right atrial pressure mean of 3.  RV pressure 26/3 with an EDP of 5.  PA pressure 28/5 with a mean of 16.  Pulmonary capillary wedge pressure mean of 7.  Fick cardiac output was 3.4 and cardiac index 2.1.   Echo in March 2012:  EF 50-55%. Grade 2 diastolic dysfunction. IVS 1.4cm.  Echo 4/14: EF 40-45%   Was seen by EP and noted to have a high percentage of RV pacing (99%) and it was felt this was worsening LV function. Underwent upgrade to CRT-P in 5/14.  Echo in 8/14 showed EF 45-50% with mild LVH.   She returns for follow up. Stressed out because she is caring for her husband. Denies SOB/PND/Orthopnea. Works full time in Audiological scientist. Compliant with medications. Walks 30 minutes a day.   Labs (5/14): K 4, creatinine 0.88 Labs 01/13/13 K 4.3 Creatinine 0.81   SH: Nonsmoker, works at [employer], lives in [place].   ROS: All systems negative except as listed in HPI, PMH and Problem List.  Past Medical History  Diagnosis Date  . Hypertrophic obstructive cardiomyopathy   . Hypokalemia   . Chronic systolic CHF (congestive heart failure)     a. EF 30-35% dx in 2011 -> most recent 40-45% by echo 04/2012. b. s/p upgrade to BiV-PPM 05/08/12. c. Med titration limited by hypotension.  . Bradycardia     a. initial PPM placed 1994 at [facility] with gen change 2002. b. 1*AVB &  intermittent 2nd degree AVB + CHF --> upgrade to [facility]. Jude BiV PPM 05/08/12.  . Pneumonia 2008  . Cluster headache syndrome   . Hypotension     Current Outpatient Prescriptions  Medication Sig Dispense Refill  . ALPRAZolam (XANAX) 0.25 MG tablet Take 1 tablet (0.25 mg total) by mouth 2 (two) times daily as needed for anxiety.  30 tablet  0  . aspirin 81 MG chewable tablet Chew 81 mg by mouth daily.      Marland Kitchen atorvastatin (LIPITOR) 10 MG tablet Take 1 tablet (10 mg total) by mouth daily.  90 tablet  3  . carvedilol (COREG) 12.5 MG tablet Take 1 tablet (12.5 mg total) by mouth 2 (two) times daily with a meal.      . cyclobenzaprine (FLEXERIL) 5 MG tablet Take 1 tablet (5 mg total) by mouth 3 (three) times daily as needed for muscle spasms.  30 tablet  0  . diclofenac sodium (VOLTAREN) 1 % GEL Apply 4 g topically 4 (four) times daily.  100 g  3  . furosemide (LASIX) 40 MG tablet TAKE 1 TABLET BY MOUTH DAILY AS NEEDED.  90 tablet  3  . lisinopril (PRINIVIL,ZESTRIL) 10 MG tablet TAKE 1 TABLET BY MOUTH DAILY.  90 tablet  2  . loratadine (CLARITIN) 10 MG tablet  Take 10 mg by mouth daily.      . nicotine (NICODERM CQ - DOSED IN MG/24 HR) 7 mg/24hr patch Place 1 patch onto the skin daily.      Marland Kitchen spironolactone (ALDACTONE) 25 MG tablet Take 12.5 mg by mouth daily.       No current facility-administered medications for this encounter.     PHYSICAL EXAM: Filed Vitals:   02/11/13 1441  BP: 106/68  Pulse: 86  Resp: 16  Weight: 124 lb (56.246 kg)  SpO2: 100%    General: NAD  HEENT: normal Neck: supple. JVP flat. Carotids 2+ bilaterally; no bruits. No lymphadenopathy or thryomegaly appreciated. Cor: PMI normal. Regular rate & rhythm. No rubs, gallops or murmurs. PPM site looks good. L upper chest CRT-D  Lungs:  Abdomen: soft, nontender, nondistended. No hepatosplenomegaly. No bruits or masses. Good bowel sounds. Extremities: no cyanosis, clubbing, rash, edema Neuro: alert & orientedx3, cranial  nerves grossly intact. Moves all 4 extremities w/o difficulty. Affect pleasant.     ASSESSMENT & PLAN: 1. Chronic systolic CHF: EF 03-88% after CRT-P upgrade 05/2012.  NYHA class I symptoms. Volume status stable. Continue Coreg, lisinopril, and spironolactone at current doses. Reviewed lab work from 01/23/13. Renal function stable.   Follow up in 6 months with an ECHO and Dr Valrie Hart    Braeden Kennan NP-C  02/11/2013

## 2013-02-11 NOTE — Patient Instructions (Signed)
Follow up in 6 months with an ECHO  Do the following things EVERYDAY: 1) Weigh yourself in the morning before breakfast. Write it down and keep it in a log. 2) Take your medicines as prescribed 3) Eat low salt foods-Limit salt (sodium) to 2000 mg per day.  4) Stay as active as you can everyday 5) Limit all fluids for the day to less than 2 liters

## 2013-02-19 ENCOUNTER — Ambulatory Visit (INDEPENDENT_AMBULATORY_CARE_PROVIDER_SITE_OTHER): Payer: 59 | Admitting: *Deleted

## 2013-02-19 ENCOUNTER — Other Ambulatory Visit (HOSPITAL_COMMUNITY): Payer: Self-pay | Admitting: Cardiology

## 2013-02-19 DIAGNOSIS — I428 Other cardiomyopathies: Secondary | ICD-10-CM

## 2013-02-19 DIAGNOSIS — I498 Other specified cardiac arrhythmias: Secondary | ICD-10-CM

## 2013-02-19 DIAGNOSIS — R001 Bradycardia, unspecified: Secondary | ICD-10-CM

## 2013-02-25 ENCOUNTER — Encounter: Payer: Self-pay | Admitting: *Deleted

## 2013-02-26 LAB — MDC_IDC_ENUM_SESS_TYPE_REMOTE
Battery Remaining Longevity: 86 mo
Brady Statistic AP VP Percent: 31 %
Brady Statistic AP VS Percent: 1 %
Brady Statistic AS VS Percent: 1 %
Implantable Pulse Generator Model: 3210
Implantable Pulse Generator Serial Number: 2936617
Lead Channel Impedance Value: 380 Ohm
Lead Channel Impedance Value: 890 Ohm
Lead Channel Pacing Threshold Amplitude: 0.75 V
Lead Channel Pacing Threshold Amplitude: 1 V
Lead Channel Pacing Threshold Pulse Width: 0.4 ms
Lead Channel Setting Pacing Amplitude: 2 V
Lead Channel Setting Pacing Pulse Width: 0.4 ms
Lead Channel Setting Pacing Pulse Width: 1 ms
Lead Channel Setting Sensing Sensitivity: 2 mV
MDC IDC MSMT BATTERY VOLTAGE: 2.98 V
MDC IDC MSMT LEADCHNL LV PACING THRESHOLD PULSEWIDTH: 1 ms
MDC IDC MSMT LEADCHNL RA IMPEDANCE VALUE: 560 Ohm
MDC IDC MSMT LEADCHNL RA PACING THRESHOLD AMPLITUDE: 0.5 V
MDC IDC MSMT LEADCHNL RA PACING THRESHOLD PULSEWIDTH: 0.4 ms
MDC IDC MSMT LEADCHNL RA SENSING INTR AMPL: 2.5 mV
MDC IDC MSMT LEADCHNL RV SENSING INTR AMPL: 8.4 mV
MDC IDC SESS DTM: 20150212091919
MDC IDC SET LEADCHNL LV PACING AMPLITUDE: 1.75 V
MDC IDC SET LEADCHNL RA PACING AMPLITUDE: 2 V
MDC IDC STAT BRADY AS VP PERCENT: 68 %
MDC IDC STAT BRADY RA PERCENT PACED: 31 %

## 2013-03-06 ENCOUNTER — Ambulatory Visit: Payer: 59 | Admitting: Family Medicine

## 2013-03-09 ENCOUNTER — Encounter: Payer: Self-pay | Admitting: *Deleted

## 2013-03-11 ENCOUNTER — Other Ambulatory Visit: Payer: Self-pay | Admitting: Internal Medicine

## 2013-03-13 ENCOUNTER — Encounter: Payer: Self-pay | Admitting: Family Medicine

## 2013-03-13 ENCOUNTER — Ambulatory Visit (INDEPENDENT_AMBULATORY_CARE_PROVIDER_SITE_OTHER): Payer: 59 | Admitting: Family Medicine

## 2013-03-13 VITALS — BP 112/84 | HR 85 | Temp 98.0°F | Resp 16 | Wt 126.4 lb

## 2013-03-13 DIAGNOSIS — F411 Generalized anxiety disorder: Secondary | ICD-10-CM

## 2013-03-13 MED ORDER — ESCITALOPRAM OXALATE 10 MG PO TABS: 10.0000 mg | ORAL_TABLET | Freq: Every day | ORAL | Status: DC

## 2013-03-13 NOTE — Patient Instructions (Signed)
Schedule your complete physical in 4-6 weeks Start the Lexapro daily Continue the xanax as needed Call with any questions or concerns You are NOT alone- we're here if you need [place]!

## 2013-03-13 NOTE — Progress Notes (Signed)
Pre visit review using our clinic review tool, if applicable. No additional management support is needed unless otherwise documented below in the visit note. 

## 2013-03-13 NOTE — Progress Notes (Signed)
   Subjective:    Patient ID: Angela Shepherd, female    DOB: 04/03/1950, 63 y.o.   MRN: [id number]  HPI Anxiety- ongoing due to husband's chronic illness.  On xanax prn.  Admits that she's having a hard time b/c she isn't getting any help from husband's family.  'they want to keep it a secret' (husband has alcoholic liver failure).  Pt is struggling to work and keep up her house b/c now that husband is on lactulose, he is messing his pants and the floor b/c he is unable to get to restroom.   Review of Systems For ROS see HPI     Objective:   Physical Exam  Vitals reviewed. Constitutional: She is oriented to person, place, and time. She appears well-developed and well-nourished. No distress.  Neurological: She is alert and oriented to person, place, and time.  Skin: Skin is warm and dry.  Psychiatric: She has a normal mood and affect. Her behavior is normal. Thought content normal.          Assessment & Plan:

## 2013-03-13 NOTE — Assessment & Plan Note (Signed)
Deteriorated due to pt's role as primary care giver.  Start daily controller SSRI- Lexapro.  Continue Xanax as needed.  Will follow.

## 2013-03-16 ENCOUNTER — Encounter: Payer: Self-pay | Admitting: Internal Medicine

## 2013-04-24 ENCOUNTER — Ambulatory Visit: Payer: 59 | Admitting: Family Medicine

## 2013-05-20 ENCOUNTER — Encounter: Payer: Self-pay | Admitting: *Deleted

## 2013-05-29 ENCOUNTER — Ambulatory Visit (INDEPENDENT_AMBULATORY_CARE_PROVIDER_SITE_OTHER): Payer: 59 | Admitting: Family Medicine

## 2013-05-29 ENCOUNTER — Encounter: Payer: Self-pay | Admitting: Family Medicine

## 2013-05-29 VITALS — BP 108/78 | HR 64 | Temp 98.0°F | Resp 16 | Ht 61.0 in | Wt 125.5 lb

## 2013-05-29 DIAGNOSIS — Z01419 Encounter for gynecological examination (general) (routine) without abnormal findings: Secondary | ICD-10-CM

## 2013-05-29 DIAGNOSIS — Z1231 Encounter for screening mammogram for malignant neoplasm of breast: Secondary | ICD-10-CM

## 2013-05-29 DIAGNOSIS — Z780 Asymptomatic menopausal state: Secondary | ICD-10-CM

## 2013-05-29 LAB — CBC WITH DIFFERENTIAL/PLATELET
BASOS ABS: 0 10*3/uL (ref 0.0–0.1)
BASOS PCT: 0 % (ref 0–1)
Eosinophils Absolute: 0.2 10*3/uL (ref 0.0–0.7)
Eosinophils Relative: 3 % (ref 0–5)
HEMATOCRIT: 39.7 % (ref 36.0–46.0)
HEMOGLOBIN: 13.8 g/dL (ref 12.0–15.0)
LYMPHS PCT: 34 % (ref 12–46)
Lymphs Abs: 2.3 10*3/uL (ref 0.7–4.0)
MCH: 33 pg (ref 26.0–34.0)
MCHC: 34.8 g/dL (ref 30.0–36.0)
MCV: 95 fL (ref 78.0–100.0)
MONOS PCT: 8 % (ref 3–12)
Monocytes Absolute: 0.6 10*3/uL (ref 0.1–1.0)
NEUTROS ABS: 3.8 10*3/uL (ref 1.7–7.7)
Neutrophils Relative %: 55 % (ref 43–77)
Platelets: 230 10*3/uL (ref 150–400)
RBC: 4.18 MIL/uL (ref 3.87–5.11)
RDW: 14.2 % (ref 11.5–15.5)
WBC: 6.9 10*3/uL (ref 4.0–10.5)

## 2013-05-29 LAB — HEPATIC FUNCTION PANEL
ALBUMIN: 4.7 g/dL (ref 3.5–5.2)
ALT: 15 U/L (ref 0–35)
AST: 18 U/L (ref 0–37)
Alkaline Phosphatase: 69 U/L (ref 39–117)
BILIRUBIN TOTAL: 0.4 mg/dL (ref 0.2–1.2)
Bilirubin, Direct: 0.1 mg/dL (ref 0.0–0.3)
Indirect Bilirubin: 0.3 mg/dL (ref 0.2–1.2)
Total Protein: 7 g/dL (ref 6.0–8.3)

## 2013-05-29 LAB — LIPID PANEL
CHOL/HDL RATIO: 2.8 ratio
Cholesterol: 195 mg/dL (ref 0–200)
HDL: 69 mg/dL (ref >39)
LDL Cholesterol: 104 mg/dL — ABNORMAL HIGH (ref 0–99)
TRIGLYCERIDES: 109 mg/dL (ref <150)
VLDL: 22 mg/dL (ref 0–40)

## 2013-05-29 LAB — BASIC METABOLIC PANEL
BUN: 21 mg/dL (ref 6–23)
CO2: 29 mEq/L (ref 19–32)
CREATININE: 1.06 mg/dL (ref 0.50–1.10)
Calcium: 9.3 mg/dL (ref 8.4–10.5)
Chloride: 100 mEq/L (ref 96–112)
Glucose, Bld: 82 mg/dL (ref 70–99)
Potassium: 4.4 mEq/L (ref 3.5–5.3)
Sodium: 139 mEq/L (ref 135–145)

## 2013-05-29 LAB — TSH: TSH: 1.487 u[IU]/mL (ref 0.350–4.500)

## 2013-05-29 NOTE — Progress Notes (Signed)
   Subjective:    Patient ID: Angela Shepherd, female    DOB: Jun 13, 1950, 63 y.o.   MRN: [id number]  HPI CPE- no concerns.  Due for mammo later this month.  Due for pap next year.  DEXA due later this month.  UTD on colonoscopy.   Review of Systems Patient reports no vision/ hearing changes, adenopathy,fever, weight change,  persistant/recurrent hoarseness , swallowing issues, chest pain, palpitations, edema, persistant/recurrent cough, hemoptysis, dyspnea (rest/exertional/paroxysmal nocturnal), gastrointestinal bleeding (melena, rectal bleeding), abdominal pain, significant heartburn, bowel changes, GU symptoms (dysuria, hematuria, incontinence), Gyn symptoms (abnormal  bleeding, pain),  syncope, focal weakness, memory loss, numbness & tingling, skin/hair/nail changes, abnormal bruising or bleeding, anxiety, or depression.     Objective:   Physical Exam General Appearance:    Alert, cooperative, no distress, appears stated age  Head:    Normocephalic, without obvious abnormality, atraumatic  Eyes:    PERRL, conjunctiva/corneas clear, EOM's intact, fundi    benign, both eyes  Ears:    Normal TM's and external ear canals, both ears  Nose:   Nares normal, septum midline, mucosa normal, no drainage    or sinus tenderness  Throat:   Lips, mucosa, and tongue normal; teeth and gums normal  Neck:   Supple, symmetrical, trachea midline, no adenopathy;    Thyroid: no enlargement/tenderness/nodules  Back:     Symmetric, no curvature, ROM normal, no CVA tenderness  Lungs:     Clear to auscultation bilaterally, respirations unlabored  Chest Wall:    No tenderness or deformity   Heart:    Regular rate and rhythm, S1 and S2 normal, no murmur, rub   or gallop  Breast Exam:    Deferred to mammo  Abdomen:     Soft, non-tender, bowel sounds active all four quadrants,    no masses, no organomegaly  Genitalia:    Deferred at pt's request until next year  Rectal:    Extremities:   Extremities normal,  atraumatic, no cyanosis or edema  Pulses:   2+ and symmetric all extremities  Skin:   Skin color, texture, turgor normal, no rashes or lesions  Lymph nodes:   Cervical, supraclavicular, and axillary nodes normal  Neurologic:   CNII-XII intact, normal strength, sensation and reflexes    throughout          Assessment & Plan:

## 2013-05-29 NOTE — Patient Instructions (Signed)
Follow up in 6 months to recheck BP and cholesterol We'll notify you of your lab results and make any changes if needed Consider calling Hospice for counseling It's ok to not be ok Please call with any questions or concerns Hang in there!!

## 2013-05-29 NOTE — Assessment & Plan Note (Signed)
Pt's PE WNL.  Refer for DEXA, mammo.  UTD on colonoscopy.  Check labs.  Anticipatory guidance provided.

## 2013-05-29 NOTE — Assessment & Plan Note (Signed)
Get DEXA.  Pt at higher risk due to caucasian ethnicity, tobacco use, small frame

## 2013-05-29 NOTE — Progress Notes (Signed)
Pre visit review using our clinic review tool, if applicable. No additional management support is needed unless otherwise documented below in the visit note. 

## 2013-06-02 LAB — VITAMIN D 1,25 DIHYDROXY
Vitamin D 1, 25 (OH)2 Total: 48 pg/mL (ref 18–72)
Vitamin D3 1, 25 (OH)2: 48 pg/mL

## 2013-06-08 ENCOUNTER — Ambulatory Visit (HOSPITAL_COMMUNITY)
Admission: RE | Admit: 2013-06-08 | Discharge: 2013-06-08 | Disposition: A | Discharge status: Home / Self Care | Payer: 59 | Source: Ambulatory Visit | Attending: Family Medicine | Admitting: Family Medicine

## 2013-06-08 DIAGNOSIS — Z780 Asymptomatic menopausal state: Secondary | ICD-10-CM

## 2013-06-08 DIAGNOSIS — Z1231 Encounter for screening mammogram for malignant neoplasm of breast: Secondary | ICD-10-CM | POA: Insufficient documentation

## 2013-06-08 LAB — HM DEXA SCAN

## 2013-06-18 ENCOUNTER — Encounter: Payer: Self-pay | Admitting: General Practice

## 2013-06-22 ENCOUNTER — Encounter: Payer: Self-pay | Admitting: *Deleted

## 2013-06-24 ENCOUNTER — Encounter: Payer: Self-pay | Admitting: Family Medicine

## 2013-07-03 ENCOUNTER — Telehealth: Payer: Self-pay | Admitting: *Deleted

## 2013-07-03 NOTE — Telephone Encounter (Signed)
Received letter from pt requesting a hard copy of Bone Density Scan to be mailed to her.  Mailed copy to pt.//AB/CMA

## 2013-07-15 ENCOUNTER — Encounter: Payer: Self-pay | Admitting: Internal Medicine

## 2013-07-21 ENCOUNTER — Ambulatory Visit (INDEPENDENT_AMBULATORY_CARE_PROVIDER_SITE_OTHER): Payer: 59 | Admitting: *Deleted

## 2013-07-21 DIAGNOSIS — I498 Other specified cardiac arrhythmias: Secondary | ICD-10-CM

## 2013-07-21 DIAGNOSIS — I428 Other cardiomyopathies: Secondary | ICD-10-CM

## 2013-07-21 DIAGNOSIS — R001 Bradycardia, unspecified: Secondary | ICD-10-CM

## 2013-07-21 NOTE — Progress Notes (Signed)
Remote pacemaker transmission.   

## 2013-07-23 LAB — MDC_IDC_ENUM_SESS_TYPE_REMOTE
Battery Voltage: 2.98 V
Brady Statistic RA Percent Paced: 33 %
Brady Statistic RV Percent Paced: 99 %
Implantable Pulse Generator Model: 3210
Implantable Pulse Generator Serial Number: 2936617
Lead Channel Impedance Value: 400 Ohm
Lead Channel Impedance Value: 430 Ohm
Lead Channel Impedance Value: 980 Ohm
Lead Channel Pacing Threshold Amplitude: 1 V
Lead Channel Pacing Threshold Pulse Width: 0.4 ms
Lead Channel Sensing Intrinsic Amplitude: 2.3 mV
Lead Channel Sensing Intrinsic Amplitude: 8.5 mV
Lead Channel Setting Pacing Amplitude: 2 V
Lead Channel Setting Pacing Pulse Width: 1 ms
Lead Channel Setting Sensing Sensitivity: 2 mV
MDC IDC SET LEADCHNL LV PACING AMPLITUDE: 1.75 V
MDC IDC SET LEADCHNL RA PACING AMPLITUDE: 2 V
MDC IDC SET LEADCHNL RV PACING PULSEWIDTH: 0.4 ms

## 2013-07-27 ENCOUNTER — Other Ambulatory Visit: Payer: Self-pay | Admitting: Internal Medicine

## 2013-07-29 ENCOUNTER — Encounter: Payer: Self-pay | Admitting: Cardiology

## 2013-07-31 ENCOUNTER — Encounter: Payer: Self-pay | Admitting: Internal Medicine

## 2013-08-21 ENCOUNTER — Telehealth (HOSPITAL_COMMUNITY): Payer: Self-pay | Admitting: *Deleted

## 2013-08-21 NOTE — Telephone Encounter (Signed)
Pt called c/o increase wt, sob and abd distention, she states wt usually runs aroun 124-125 this wk it has been increasing and today she is at 130 lb, she states she is more SOB with activity, denies LE edema but states her stomach is very bloated, pants are tight, pt is taking lasix 40 mg daily and spiro 12.5 mg daily.  We discussed fluid restriction and salt intake, she is under 2 L daily and 2 grams of salt daily, she will take an extra 40 mg of lasix today and tomorrow, if not improved by Mon she will call us back, she is sch for echo and f/u on 8/27

## 2013-08-26 ENCOUNTER — Encounter (HOSPITAL_COMMUNITY): Payer: 59

## 2013-09-03 ENCOUNTER — Encounter (HOSPITAL_COMMUNITY): Payer: Self-pay

## 2013-09-03 ENCOUNTER — Ambulatory Visit (HOSPITAL_COMMUNITY)
Admission: RE | Admit: 2013-09-03 | Discharge: 2013-09-03 | Disposition: A | Discharge status: Home / Self Care | Payer: 59 | Source: Ambulatory Visit | Attending: Cardiology | Admitting: Cardiology

## 2013-09-03 ENCOUNTER — Ambulatory Visit (HOSPITAL_COMMUNITY)
Admission: RE | Admit: 2013-09-03 | Discharge: 2013-09-03 | Disposition: A | Discharge status: Home / Self Care | Payer: 59 | Source: Ambulatory Visit | Attending: Family Medicine | Admitting: Family Medicine

## 2013-09-03 VITALS — BP 100/62 | HR 62 | Wt 126.0 lb

## 2013-09-03 DIAGNOSIS — I5032 Chronic diastolic (congestive) heart failure: Secondary | ICD-10-CM

## 2013-09-03 DIAGNOSIS — I10 Essential (primary) hypertension: Secondary | ICD-10-CM | POA: Diagnosis not present

## 2013-09-03 DIAGNOSIS — Z87891 Personal history of nicotine dependence: Secondary | ICD-10-CM | POA: Insufficient documentation

## 2013-09-03 DIAGNOSIS — I509 Heart failure, unspecified: Secondary | ICD-10-CM | POA: Insufficient documentation

## 2013-09-03 DIAGNOSIS — I369 Nonrheumatic tricuspid valve disorder, unspecified: Secondary | ICD-10-CM

## 2013-09-03 DIAGNOSIS — I5022 Chronic systolic (congestive) heart failure: Secondary | ICD-10-CM | POA: Diagnosis present

## 2013-09-03 LAB — BASIC METABOLIC PANEL
ANION GAP: 14 (ref 5–15)
BUN: 30 mg/dL — ABNORMAL HIGH (ref 6–23)
CO2: 27 meq/L (ref 19–32)
Calcium: 10.4 mg/dL (ref 8.4–10.5)
Chloride: 97 mEq/L (ref 96–112)
Creatinine, Ser: 1.07 mg/dL (ref 0.50–1.10)
GFR calc Af Amer: 64 mL/min — ABNORMAL LOW (ref >90)
GFR calc non Af Amer: 55 mL/min — ABNORMAL LOW (ref >90)
Glucose, Bld: 95 mg/dL (ref 70–99)
POTASSIUM: 4.6 meq/L (ref 3.7–5.3)
SODIUM: 138 meq/L (ref 137–147)

## 2013-09-03 LAB — PRO B NATRIURETIC PEPTIDE: PRO B NATRI PEPTIDE: 958.2 pg/mL — AB (ref 0–125)

## 2013-09-03 MED ORDER — SPIRONOLACTONE 25 MG PO TABS: ORAL_TABLET | ORAL | Status: DC

## 2013-09-03 MED ORDER — FUROSEMIDE 40 MG PO TABS: 40.0000 mg | ORAL_TABLET | Freq: Two times a day (BID) | ORAL | Status: DC

## 2013-09-03 NOTE — Patient Instructions (Signed)
Labs today  Your physician recommends that you schedule a follow-up appointment in: 4 months  Do the following things EVERYDAY: 1) Weigh yourself in the morning before breakfast. Write it down and keep it in a log. 2) Take your medicines as prescribed 3) Eat low salt foods-Limit salt (sodium) to 2000 mg per day.  4) Stay as active as you can everyday 5) Limit all fluids for the day to less than 2 liters 6)   

## 2013-09-03 NOTE — Progress Notes (Signed)
Patient ID: Angela Shepherd, female   DOB: [birth year], 63 y.o.   MRN: [id number] PCP: Dr. 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 PMHx also notable for HTN and prior tobacco abuse (quit 02/2010).   Was admitted in December 2011 with acute HF. Echo with EF 30-35%, There was also evidence of ASH with septum of 1.7 PW = 1.9. Diuresed and scheduled for outpt cath.  Cath 12/2009: Normal coronaries. EF 40-45%.  Central aortic pressure was 86/47 with a mean of 62.  LV pressure is 74/13 with an EDP of 2.  Right atrial pressure mean of 3.  RV pressure 26/3 with an EDP of 5.  PA pressure 28/5 with a mean of 16.  Pulmonary capillary wedge pressure mean of 7.  Fick cardiac output was 3.4 and cardiac index 2.1.   Echo in March 2012:  EF 50-55%. Grade 2 diastolic dysfunction. IVS 1.4cm.  Echo 4/14: EF 40-45%  ECHO 09/03/13: EF 40-45%  Was seen by EP and noted to have a high percentage of RV pacing (99%) and it was felt this was worsening LV function. Underwent upgrade to [facility]. Jude CRT-P in 5/14.  Echo in 8/14 showed EF 45-50% with mild LVH.   She returns for follow up. About 2 weeks ago fluid was going up. Gained 5 pounds. Called the Clinic and lasix increased to 40 bid. Does not eat any salt.  Denies SOB/PND/Orthopnea.  Compliant with medications but she is not taking aspirin or atorvastatin. Walks 20-30 minutes a day. Not smoking.   Labs (5/14): K 4, creatinine 0.88 Labs 01/13/13 K 4.3 Creatinine 0.81  Labs 05/29/13 K 4.4 Creatinine 1.0  SH: Nonsmoker, works at [employer], lives in [place].   ROS: All systems negative except as listed in HPI, PMH and Problem List.  Past Medical History  Diagnosis Date  . Hypertrophic obstructive cardiomyopathy   . Hypokalemia   . Chronic systolic CHF (congestive heart failure)     a. EF 30-35% dx in 2011 -> most recent 40-45% by echo  04/2012. b. s/p upgrade to BiV-PPM 05/08/12. c. Med titration limited by hypotension.  . Bradycardia     a. initial PPM placed 1994 at [facility] with gen change 2002. b. 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 Jude BiV PPM 05/08/12.  . Pneumonia 2008  . Cluster headache syndrome   . Hypotension     Current Outpatient Prescriptions  Medication Sig Dispense Refill  . carvedilol (COREG) 12.5 MG tablet Take 1 tablet (12.5 mg total) by mouth 2 (two) times daily with a meal.  60 tablet  12  . diclofenac sodium (VOLTAREN) 1 % GEL Apply 4 g topically 4 (four) times daily.  100 g  3  . furosemide (LASIX) 40 MG tablet 40 mg twice a day      . lisinopril (PRINIVIL,ZESTRIL) 10 MG tablet TAKE 1 TABLET BY MOUTH ONCE DAILY  90 tablet  1  . loratadine (CLARITIN) 10 MG tablet Take 10 mg by mouth daily.      . nicotine (NICODERM CQ - DOSED IN MG/24 HR) 7 mg/24hr patch Place 1 patch onto the skin daily.      Marland Kitchen spironolactone (ALDACTONE) 25 MG tablet TAKE 1/2 TABLET BY MOUTH DAILY  15 tablet  6  . ALPRAZolam (XANAX) 0.25 MG tablet Take 1 tablet (0.25  mg total) by mouth 2 (two) times daily as needed for anxiety.  30 tablet  0  . cyclobenzaprine (FLEXERIL) 5 MG tablet Take 1 tablet (5 mg total) by mouth 3 (three) times daily as needed for muscle spasms.  30 tablet  0  . escitalopram (LEXAPRO) 10 MG tablet Take 1 tablet (10 mg total) by mouth daily.  30 tablet  6   No current facility-administered medications for this encounter.     PHYSICAL EXAM: Filed Vitals:   09/03/13 1400  BP: 100/62  Pulse: 94  Weight: 126 lb (57.153 kg)  SpO2: 99%    General: NAD  HEENT: normal Neck: supple. JVP flat. Carotids 2+ bilaterally; no bruits. No lymphadenopathy or thryomegaly appreciated. Cor: PMI normal. Regular rate & rhythm. No rubs, gallops or murmurs. PPM site looks good. L upper chest CRT-D  Lungs:  Abdomen: soft, nontender, nondistended. No hepatosplenomegaly. No bruits or masses. Good bowel  sounds. Extremities: no cyanosis, clubbing, rash, edema Neuro: alert & orientedx3, cranial nerves grossly intact. Moves all 4 extremities w/o difficulty. Affect pleasant.   ASSESSMENT & PLAN: 1. Chronic systolic CHF: EF 08-81% s/p CRT-P upgrade 05/2012 (due to chronic RV pacing)  NYHA class I-II symptoms. Volume status stable. Continue Coreg, lisinopril, and spironolactone at current doses. Reviewed lab work from 05/29/13  Renal function stable.  --Lasix recently increased due to volume overload. Now on lasix 40 bid and improved. --BP soft but not orthostatic. --ECHO reviewed personally today and EF stable in 40-45% range --ICD interrogated in clinic with [employer] rep. Normal function. 1 brief run (8 secs of SVT). No impedance monitoring on this device --Will continue current meds --Check BMET, BNP to make sure renal function and electrolytes stable after diuretics increased.  2. H/o HCM  3. HTN 4. Tobacco use quit 2012  Follow up in 3 months.     Quillian Quince Joban Colledge NP-C  09/03/2013

## 2013-09-03 NOTE — Addendum Note (Signed)
Encounter addended by: Kerry Dory, CMA on: 09/03/2013  4:25 PM<BR>     Documentation filed: Orders

## 2013-09-03 NOTE — Progress Notes (Signed)
  Echocardiogram 2D Echocardiogram has been performed.  Darlina Sicilian M 09/03/2013, 1:33 PM

## 2013-09-23 ENCOUNTER — Encounter: Payer: Self-pay | Admitting: Internal Medicine

## 2013-10-26 ENCOUNTER — Encounter: Payer: Self-pay | Admitting: Family Medicine

## 2013-10-29 ENCOUNTER — Encounter: Payer: Self-pay | Admitting: Family Medicine

## 2013-10-29 ENCOUNTER — Ambulatory Visit (HOSPITAL_BASED_OUTPATIENT_CLINIC_OR_DEPARTMENT_OTHER)
Admission: RE | Admit: 2013-10-29 | Discharge: 2013-10-29 | Disposition: A | Discharge status: Home / Self Care | Payer: 59 | Source: Ambulatory Visit | Attending: Family Medicine | Admitting: Family Medicine

## 2013-10-29 ENCOUNTER — Other Ambulatory Visit: Payer: Self-pay | Admitting: General Practice

## 2013-10-29 ENCOUNTER — Ambulatory Visit (INDEPENDENT_AMBULATORY_CARE_PROVIDER_SITE_OTHER): Payer: 59 | Admitting: Family Medicine

## 2013-10-29 VITALS — BP 106/80 | HR 77 | Temp 98.1°F | Resp 16 | Wt 129.5 lb

## 2013-10-29 DIAGNOSIS — R0789 Other chest pain: Secondary | ICD-10-CM | POA: Insufficient documentation

## 2013-10-29 MED ORDER — PREDNISONE 10 MG PO TABS: ORAL_TABLET | ORAL | Status: DC

## 2013-10-29 MED ORDER — CYCLOBENZAPRINE HCL 5 MG PO TABS: 5.0000 mg | ORAL_TABLET | Freq: Three times a day (TID) | ORAL | Status: DC | PRN

## 2013-10-29 NOTE — Progress Notes (Signed)
Pre visit review using our clinic review tool, if applicable. No additional management support is needed unless otherwise documented below in the visit note. 

## 2013-10-29 NOTE — Assessment & Plan Note (Signed)
New.  Suspect R pectoralis strain.  Given pt's smoking hx will get CXR.  Start prednisone taper for musculoskeletal pain.  If no improvement in pain, will repeat mammo- although pt w/o breast pain, masses, or skin changes on exam today.  Reviewed supportive care and red flags that should prompt return.  Pt expressed understanding and is in agreement w/ plan.

## 2013-10-29 NOTE — Progress Notes (Signed)
   Subjective:    Patient ID: Angela Shepherd, female    DOB: 22-Jul-1950, 63 y.o.   MRN: [id number]  HPI Chest wall pain- R sided, 'feels like my pectoral muscle'.  Has started exercising recently, doing some lifting.  No pain w/ breathing.  Pain is 'more muscular'.  No lumps in breast.  Pain started ~3 weeks ago.  No injury.  Pain is worse w/ rotation.  Wearing good, supportive bras.  Pt fears lung cancer due to smoking.   Review of Systems For ROS see HPI     Objective:   Physical Exam  Vitals reviewed. Constitutional: She is oriented to person, place, and time. She appears well-developed and well-nourished. No distress.  HENT:  Head: Normocephalic and atraumatic.  Cardiovascular: Normal rate, regular rhythm and intact distal pulses.   Pulmonary/Chest: Effort normal and breath sounds normal. No respiratory distress. She has no wheezes. She has no rales. She exhibits tenderness (over R anterior chest wall).  No breast masses or skin changes  Neurological: She is alert and oriented to person, place, and time.  Skin: Skin is warm and dry.  Psychiatric: She has a normal mood and affect. Her behavior is normal. Thought content normal.          Assessment & Plan:

## 2013-10-29 NOTE — Patient Instructions (Signed)
Follow up as needed Get your chest xray downstairs before you leave Start the Prednisone as directed- take w/ food Use the flexeril as needed for spasm HEAT! If no improvement after the prednisone, please let me know so we can repeat the mammo Call with any questions or concerns Hang in there!

## 2013-11-18 ENCOUNTER — Encounter: Payer: Self-pay | Admitting: *Deleted

## 2013-12-11 ENCOUNTER — Ambulatory Visit (INDEPENDENT_AMBULATORY_CARE_PROVIDER_SITE_OTHER): Payer: 59 | Admitting: Family Medicine

## 2013-12-11 ENCOUNTER — Encounter: Payer: Self-pay | Admitting: Family Medicine

## 2013-12-11 VITALS — BP 112/72 | HR 72 | Temp 98.1°F | Resp 16 | Wt 128.4 lb

## 2013-12-11 DIAGNOSIS — G44009 Cluster headache syndrome, unspecified, not intractable: Secondary | ICD-10-CM | POA: Insufficient documentation

## 2013-12-11 DIAGNOSIS — I10 Essential (primary) hypertension: Secondary | ICD-10-CM

## 2013-12-11 LAB — BASIC METABOLIC PANEL
BUN: 42 mg/dL — ABNORMAL HIGH (ref 6–23)
CALCIUM: 9.8 mg/dL (ref 8.4–10.5)
CHLORIDE: 98 meq/L (ref 96–112)
CO2: 29 mEq/L (ref 19–32)
Creat: 1.23 mg/dL — ABNORMAL HIGH (ref 0.50–1.10)
Glucose, Bld: 113 mg/dL — ABNORMAL HIGH (ref 70–99)
Potassium: 4.5 mEq/L (ref 3.5–5.3)
SODIUM: 136 meq/L (ref 135–145)

## 2013-12-11 MED ORDER — PREDNISONE 10 MG PO TABS: ORAL_TABLET | ORAL | Status: DC

## 2013-12-11 NOTE — Progress Notes (Signed)
   Subjective:    Patient ID: Angela Shepherd, female    DOB: 09-08-1950, 63 y.o.   MRN: [id number]  HPI HTN- chronic problem, excellent control on Coreg, Lasix, Lisinopril, Spironolactone.  Denies CP, SOB above baseline, HAs, visual changes, edema.  Cluster migraines- pt reports hx of this, has previously been on Prednisone taper to use as needed.  Pt will get aura preceding this.  Last occurred 2 yrs ago.  Pt very aware of onset as they typically occur in the middle of the night.  Can last for up to 30 days.   Review of Systems For ROS see HPI     Objective:   Physical Exam  Constitutional: She is oriented to person, place, and time. She appears well-developed and well-nourished. No distress.  HENT:  Head: Normocephalic and atraumatic.  Eyes: Conjunctivae and EOM are normal. Pupils are equal, round, and reactive to light.  Neck: Normal range of motion. Neck supple. No thyromegaly present.  Cardiovascular: Normal rate, regular rhythm, normal heart sounds and intact distal pulses.   No murmur heard. Pulmonary/Chest: Effort normal and breath sounds normal. No respiratory distress.  Abdominal: Soft. She exhibits no distension. There is no tenderness.  Musculoskeletal: She exhibits no edema.  Lymphadenopathy:    She has no cervical adenopathy.  Neurological: She is alert and oriented to person, place, and time.  Skin: Skin is warm and dry.  Psychiatric: She has a normal mood and affect. Her behavior is normal.  Vitals reviewed.         Assessment & Plan:

## 2013-12-11 NOTE — Patient Instructions (Signed)
Schedule your complete physical in 6 months We'll notify you of your lab results and make any changes if needed Keep up the good work on healthy diet, try and get regular exercise Use the prednisone if you develop a headache (fingers crossed- no!) Call with any questions or concerns If you can, enjoy the holidays.  If not, get through them!

## 2013-12-11 NOTE — Progress Notes (Signed)
Pre visit review using our clinic review tool, if applicable. No additional management support is needed unless otherwise documented below in the visit note. 

## 2013-12-13 NOTE — Assessment & Plan Note (Signed)
New to provider.  Pt w/ hx of this.  Occurs intermittently- last occurred 2 yrs ago.  Pt reports pain improves w/ prednisone.  Pt given prescription to have on hand in case of recurrence.  Pt expressed understanding and is in agreement w/ plan.

## 2013-12-13 NOTE — Assessment & Plan Note (Signed)
Chronic problem.  Well controlled on current meds.  Asymptomatic.  Check labs.  No anticipated med changes.

## 2013-12-14 ENCOUNTER — Other Ambulatory Visit: Payer: Self-pay | Admitting: Family Medicine

## 2013-12-14 DIAGNOSIS — R7989 Other specified abnormal findings of blood chemistry: Secondary | ICD-10-CM

## 2013-12-17 ENCOUNTER — Encounter (HOSPITAL_COMMUNITY): Payer: Self-pay | Admitting: Internal Medicine

## 2013-12-28 ENCOUNTER — Encounter (HOSPITAL_COMMUNITY): Payer: 59

## 2014-01-05 ENCOUNTER — Encounter: Payer: Self-pay | Admitting: *Deleted

## 2014-01-22 ENCOUNTER — Encounter (HOSPITAL_COMMUNITY): Payer: Self-pay | Admitting: Cardiology

## 2014-01-22 ENCOUNTER — Other Ambulatory Visit (HOSPITAL_COMMUNITY): Payer: Self-pay | Admitting: Cardiology

## 2014-01-22 MED ORDER — LISINOPRIL 10 MG PO TABS: 10.0000 mg | ORAL_TABLET | Freq: Every day | ORAL | Status: DC

## 2014-01-27 ENCOUNTER — Other Ambulatory Visit: Payer: Self-pay

## 2014-01-27 MED ORDER — LISINOPRIL 10 MG PO TABS: 10.0000 mg | ORAL_TABLET | Freq: Every day | ORAL | Status: DC

## 2014-02-09 ENCOUNTER — Encounter (HOSPITAL_COMMUNITY): Payer: Self-pay

## 2014-02-09 ENCOUNTER — Ambulatory Visit (HOSPITAL_COMMUNITY)
Admission: RE | Admit: 2014-02-09 | Discharge: 2014-02-09 | Disposition: A | Discharge status: Home / Self Care | Payer: 59 | Source: Ambulatory Visit | Attending: Internal Medicine | Admitting: Internal Medicine

## 2014-02-09 VITALS — BP 80/58 | HR 62 | Wt 129.8 lb

## 2014-02-09 DIAGNOSIS — F17200 Nicotine dependence, unspecified, uncomplicated: Secondary | ICD-10-CM

## 2014-02-09 DIAGNOSIS — Z720 Tobacco use: Secondary | ICD-10-CM

## 2014-02-09 DIAGNOSIS — I5022 Chronic systolic (congestive) heart failure: Secondary | ICD-10-CM | POA: Insufficient documentation

## 2014-02-09 DIAGNOSIS — I447 Left bundle-branch block, unspecified: Secondary | ICD-10-CM

## 2014-02-09 LAB — BASIC METABOLIC PANEL
ANION GAP: 6 (ref 5–15)
BUN: 31 mg/dL — ABNORMAL HIGH (ref 6–23)
CO2: 32 mmol/L (ref 19–32)
Calcium: 9.4 mg/dL (ref 8.4–10.5)
Chloride: 100 mmol/L (ref 96–112)
Creatinine, Ser: 1.15 mg/dL — ABNORMAL HIGH (ref 0.50–1.10)
GFR calc non Af Amer: 50 mL/min — ABNORMAL LOW (ref >90)
GFR, EST AFRICAN AMERICAN: 58 mL/min — AB (ref >90)
GLUCOSE: 103 mg/dL — AB (ref 70–99)
POTASSIUM: 4.4 mmol/L (ref 3.5–5.1)
SODIUM: 138 mmol/L (ref 135–145)

## 2014-02-09 LAB — BRAIN NATRIURETIC PEPTIDE: B Natriuretic Peptide: 248.9 pg/mL — ABNORMAL HIGH (ref 0.0–100.0)

## 2014-02-09 MED ORDER — FUROSEMIDE 40 MG PO TABS: 40.0000 mg | ORAL_TABLET | Freq: Every day | ORAL | Status: DC

## 2014-02-09 NOTE — Progress Notes (Signed)
Patient ID: Angela Shepherd, female   DOB: 1950-11-12, 64 y.o.   MRN: [id number] PCP: Dr. 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 PMHx also notable for HTN and prior tobacco abuse (quit 02/2010).   Was admitted in December 2011 with acute HF. Echo with EF 30-35%, There was also evidence of ASH with septum of 1.7 PW = 1.9. Diuresed and scheduled for outpt cath.  Cath 12/2009: Normal coronaries. EF 40-45%.  Central aortic pressure was 86/47 with a mean of 62.  LV pressure is 74/13 with an EDP of 2.  Right atrial pressure mean of 3.  RV pressure 26/3 with an EDP of 5.  PA pressure 28/5 with a mean of 16.  Pulmonary capillary wedge pressure mean of 7.  Fick cardiac output was 3.4 and cardiac index 2.1.   Echo in March 2012:  EF 50-55%. Grade 2 diastolic dysfunction. IVS 1.4cm.  Echo 4/14: EF 40-45%  ECHO 09/03/13: EF 40-45%  Was seen by EP and noted to have a high percentage of RV pacing (99%) and it was felt this was worsening LV function. Underwent upgrade to [facility]. Jude CRT-P in 5/14.  Echo in 8/14 showed EF 45-50% with mild LVH.   She returns for follow up. Overall feels great. Weight at home 125-132 pounds. Taking all medications.  Does not eat any salt.  Denies SOB/PND/Orthopnea.  Walks 30 minutes a day. Not smoking using nicotine patch. Continues to work full time.   Labs (5/14): K 4, creatinine 0.88 Labs 01/13/13 K 4.3 Creatinine 0.81  Labs 05/29/13 K 4.4 Creatinine 1.0 12/12/2014: K 4.5 Creatinine 1.23   SH: Nonsmoker, works at [employer], lives in [place].   ROS: All systems negative except as listed in HPI, PMH and Problem List.  Past Medical History  Diagnosis Date  . Hypertrophic obstructive cardiomyopathy   . Hypokalemia   . Chronic systolic CHF (congestive heart failure)     a. EF 30-35% dx in 2011 -> most recent 40-45% by echo 04/2012. b. s/p upgrade  to BiV-PPM 05/08/12. c. Med titration limited by hypotension.  . Bradycardia     a. initial PPM placed 1994 at [facility] with gen change 2002. b. 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 Jude BiV PPM 05/08/12.  . Pneumonia 2008  . Cluster headache syndrome   . Hypotension     Current Outpatient Prescriptions  Medication Sig Dispense Refill  . carvedilol (COREG) 12.5 MG tablet Take 1 tablet (12.5 mg total) by mouth 2 (two) times daily with a meal. 60 tablet 12  . escitalopram (LEXAPRO) 10 MG tablet Take 1 tablet (10 mg total) by mouth daily. 30 tablet 6  . furosemide (LASIX) 40 MG tablet Take 1 tablet (40 mg total) by mouth 2 (two) times daily. 60 tablet 6  . lisinopril (PRINIVIL,ZESTRIL) 10 MG tablet Take 1 tablet (10 mg total) by mouth daily. 90 tablet 1  . nicotine (NICODERM CQ - DOSED IN MG/24 HR) 7 mg/24hr patch Place 1 patch onto the skin daily.    Marland Kitchen spironolactone (ALDACTONE) 25 MG tablet TAKE 1/2 TABLET BY MOUTH DAILY 15 tablet 6  . diclofenac sodium (VOLTAREN) 1 % GEL Apply 4 g topically 4 (four) times daily. (Patient not taking: Reported on 02/09/2014) 100 g 3  . loratadine (CLARITIN) 10 MG tablet Take 10 mg  by mouth daily.    . predniSONE (DELTASONE) 10 MG tablet 3 tabs x3 days and then 2 tabs x3 days and then 1 tab x3 days.  Take w/ food. (Patient not taking: Reported on 02/09/2014) 18 tablet 0   No current facility-administered medications for this encounter.     PHYSICAL EXAM: Filed Vitals:   02/09/14 1350  BP: 80/58  Pulse: 62  Weight: 129 lb 12.8 oz (58.877 kg)  SpO2: 100%    General: NAD  HEENT: normal Neck: supple. JVP flat. Carotids 2+ bilaterally; no bruits. No lymphadenopathy or thryomegaly appreciated. Cor: PMI normal. Regular rate & rhythm. No rubs, gallops or murmurs. PPM site looks good. L upper chest CRT-D  Lungs:  Abdomen: soft, nontender, nondistended. No hepatosplenomegaly. No bruits or masses. Good bowel sounds. Extremities: no cyanosis,  clubbing, rash, edema Neuro: alert & orientedx3, cranial nerves grossly intact. Moves all 4 extremities w/o difficulty. Affect pleasant.   ASSESSMENT & PLAN: 1. Chronic systolic CHF: EF 17% s/p CRT-P upgrade 05/2012 (due to chronic RV pacing)  NYHA class I-II symptoms. Volume status stable. Continue Coreg, lisinopril, and spironolactone at current doses. Reviewed lab work from 12/2013  Renal function slightly elevated.  Check BMET and BNP  --Change lasix to 40 mg daily with an extra 40 mg for 3 pound weight gain.  --BP soft  2. H/o HCM  3. HTN- today BP low today as above. Cut back lasix.  4. Tobacco use quit 2012  Follow up in 6 months.     Khayri Kargbo NP-C  02/09/2014

## 2014-02-09 NOTE — Patient Instructions (Signed)
Decrease Furosemide (lasix) to 40 mg daily, can take extra tab as needed for swelling  Labs today  We will contact you in 6 months to schedule your next appointment.

## 2014-02-22 ENCOUNTER — Ambulatory Visit (INDEPENDENT_AMBULATORY_CARE_PROVIDER_SITE_OTHER): Payer: 59 | Admitting: Internal Medicine

## 2014-02-22 ENCOUNTER — Encounter: Payer: Self-pay | Admitting: Internal Medicine

## 2014-02-22 VITALS — BP 112/60 | HR 69 | Ht 62.0 in | Wt 132.8 lb

## 2014-02-22 DIAGNOSIS — R001 Bradycardia, unspecified: Secondary | ICD-10-CM

## 2014-02-22 DIAGNOSIS — I5032 Chronic diastolic (congestive) heart failure: Secondary | ICD-10-CM

## 2014-02-22 DIAGNOSIS — I447 Left bundle-branch block, unspecified: Secondary | ICD-10-CM

## 2014-02-22 LAB — MDC_IDC_ENUM_SESS_TYPE_INCLINIC
Battery Remaining Longevity: 96 mo
Battery Voltage: 2.98 V
Brady Statistic RA Percent Paced: 37 %
Brady Statistic RV Percent Paced: 99.84 %
Implantable Pulse Generator Model: 3210
Implantable Pulse Generator Serial Number: 2936617
Lead Channel Impedance Value: 375 Ohm
Lead Channel Pacing Threshold Amplitude: 0.5 V
Lead Channel Pacing Threshold Amplitude: 1.125 V
Lead Channel Pacing Threshold Pulse Width: 0.4 ms
Lead Channel Pacing Threshold Pulse Width: 0.4 ms
Lead Channel Pacing Threshold Pulse Width: 1 ms
Lead Channel Sensing Intrinsic Amplitude: 7 mV
Lead Channel Setting Pacing Amplitude: 1.75 V
Lead Channel Setting Pacing Amplitude: 2 V
Lead Channel Setting Pacing Amplitude: 2.125
Lead Channel Setting Pacing Pulse Width: 1 ms
Lead Channel Setting Sensing Sensitivity: 2 mV
MDC IDC MSMT LEADCHNL LV IMPEDANCE VALUE: 937.5 Ohm
MDC IDC MSMT LEADCHNL LV PACING THRESHOLD AMPLITUDE: 0.75 V
MDC IDC MSMT LEADCHNL RA IMPEDANCE VALUE: 450 Ohm
MDC IDC MSMT LEADCHNL RA SENSING INTR AMPL: 2.2 mV
MDC IDC SESS DTM: 20160215164947
MDC IDC SET LEADCHNL RV PACING PULSEWIDTH: 0.4 ms

## 2014-02-22 NOTE — Progress Notes (Signed)
Electrophysiology Office Note   Date:  02/22/2014   ID:  Renny, Gunnarson 1950/11/14, MRN [id number]  PCP:  Annye Asa, MD  Cardiologist:  Dr Haroldine Laws Primary Electrophysiologist: Angela Grayer, MD    Chief Complaint  Patient presents with  . Follow-up    SOB     History of Present Illness: Angela Shepherd is a 64 y.o. female who presents today for electrophysiology evaluation.   The patient is doing quite well at this time.  She remains active.  She is presently without limitation.     Today, she denies symptoms of palpitations, chest pain, shortness of breath, orthopnea, PND, lower extremity edema, claudication, dizziness, presyncope, syncope, bleeding, or neurologic sequela. The patient is tolerating medications without difficulties and is otherwise without complaint today.    Past Medical History  Diagnosis Date  . Hypertrophic obstructive cardiomyopathy   . Hypokalemia   . Chronic systolic CHF (congestive heart failure)     a. EF 30-35% dx in 2011 -> most recent 40-45% by echo 04/2012. b. s/p upgrade to BiV-PPM 05/08/12. c. Med titration limited by hypotension.  . Bradycardia     a. initial PPM placed 1994 at [facility] with gen change 2002. b. 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 Jude BiV PPM 05/08/12.  . Pneumonia 2008  . Cluster headache syndrome   . Hypotension    Past Surgical History  Procedure Laterality Date  . Tonsillectomy  ~ 1959  . Tubal ligation  1984?  Marland Kitchen Biv pacemaker generator change out  05/08/2012    upgrade to [facility] Anthem (CRT-P) by Dr Rayann Heman  . Pacemaker insertion  1994; 2002; 05/08/2012  . Insert / replace / remove pacemaker  1994; 2002; 05/08/2012  . Bi-ventricular pacemaker upgrade N/A 05/08/2012    Procedure: BI-VENTRICULAR PACEMAKER UPGRADE;  Surgeon: Angela Grayer, MD;  Location: [facility] CATH LAB;  Service: Cardiovascular;  Laterality: N/A;     Current Outpatient Prescriptions  Medication Sig Dispense Refill  .  carvedilol (COREG) 12.5 MG tablet Take 1 tablet (12.5 mg total) by mouth 2 (two) times daily with a meal. 60 tablet 12  . diclofenac sodium (VOLTAREN) 1 % GEL Apply 4 g topically 4 (four) times daily. 100 g 3  . escitalopram (LEXAPRO) 10 MG tablet Take 1 tablet (10 mg total) by mouth daily. 30 tablet 6  . furosemide (LASIX) 40 MG tablet Take 1 tablet (40 mg total) by mouth daily. Take an extra tab as needed (Patient taking differently: Take 40 mg by mouth daily. Take an extra tablet daily as needed for swelling) 60 tablet 6  . lisinopril (PRINIVIL,ZESTRIL) 10 MG tablet Take 1 tablet (10 mg total) by mouth daily. 90 tablet 1  . nicotine (NICODERM CQ - DOSED IN MG/24 HR) 7 mg/24hr patch Place 1 patch onto the skin daily.    Marland Kitchen spironolactone (ALDACTONE) 25 MG tablet TAKE 1/2 TABLET BY MOUTH DAILY 15 tablet 6  . predniSONE (DELTASONE) 10 MG tablet 3 tabs x3 days and then 2 tabs x3 days and then 1 tab x3 days.  Take w/ food. (Patient not taking: Reported on 02/22/2014) 18 tablet 0   No current facility-administered medications for this visit.    Allergies:   Review of patient's allergies indicates no known allergies.   Social History:  The patient  reports that she quit smoking about 4 years ago. Her smoking use included Cigarettes. She has a 3 pack-year smoking history. She has never used  smokeless tobacco. She reports that she does not drink alcohol or use illicit drugs.   Family History:  The patient's family history includes Cancer (age of onset: 31) in her sister; Cardiomyopathy in an other family member; Diabetes in an other family member; Heart disease in her sister. There is no history of Colon cancer, Esophageal cancer, Stomach cancer, or Rectal cancer.    ROS:  Please see the history of present illness.   All other systems are reviewed and negative.    PHYSICAL EXAM: VS:  BP 112/60 mmHg  Pulse 69  Ht 5\' 2"  (1.575 m)  Wt 132 lb 12.8 oz (60.238 kg)  BMI 24.28 kg/m2 , BMI Body mass index  is 24.28 kg/(m^2). GEN: Well nourished, well developed, in no acute distress HEENT: normal Neck: no JVD, carotid bruits, or masses Cardiac: RRR; no murmurs, rubs, or gallops,no edema  Respiratory:  clear to auscultation bilaterally, normal work of breathing GI: soft, nontender, nondistended, + BS MS: no deformity or atrophy Skin: warm and dry, device pocket is well healed Neuro:  Strength and sensation are intact Psych: euthymic mood, full affect  EKG:  EKG is ordered today. The ekg ordered today shows sinus with Biv pacing  Device interrogation is reviewed today in detail.  See PaceArt for details.   Recent Labs: 05/29/2013: ALT 15; Hemoglobin 13.8; Platelets 230; TSH 1.487 09/03/2013: Pro B Natriuretic peptide (BNP) 958.2* 02/09/2014: B Natriuretic Peptide 248.9*; BUN 31*; Creatinine 1.15*; Potassium 4.4; Sodium 138    Lipid Panel     Component Value Date/Time   CHOL 195 05/29/2013 1501   TRIG 109 05/29/2013 1501   HDL 69 05/29/2013 1501   CHOLHDL 2.8 05/29/2013 1501   VLDL 22 05/29/2013 1501   LDLCALC 104* 05/29/2013 1501   LDLDIRECT 148.9 11/28/2012 1004     Wt Readings from Last 3 Encounters:  02/22/14 132 lb 12.8 oz (60.238 kg)  12/11/13 128 lb 6 oz (58.231 kg)  10/29/13 129 lb 8 oz (58.741 kg)      Other studies Reviewed: Additional studies/ records that were reviewed today include: I have done VV optimization with the patient today, reviewing 12 different EKGs of various VV timings Her intrinsic QRS is only 90 msec but with a PR interval of 400 msec. Her best  VV timing is with RV prior to LV by 20 msec (QRS 168 msec)     ASSESSMENT AND PLAN:  1.  Chronic systolic dysfunction Clinically doing very well Today I have optimized VV timing as last echo 2015 (reviewed today) reveals EF 30%. She will return in 4 weeks to the device clinic to assess response to this change. She has a narrow QRS intrinsically but with a PR of 400 msec.  Continue to follow on  Merlin    Current medicines are reviewed at length with the patient today.   The patient does not have concerns regarding her medicines.  The following changes were made today:  none  Signed, Angela Grayer, MD  02/22/2014 4:54 PM     [place] [address] [phone] (office) [phone] (fax)

## 2014-02-22 NOTE — Patient Instructions (Signed)
Your physician recommends that you schedule a follow-up appointment in: 4 weeks with Chanetta Marshall, RN in device clinic with Terrence Dupont

## 2014-03-08 NOTE — Addendum Note (Signed)
Encounter addended by: Conrad [place], NP on: 03/08/2014  8:26 PM<BR>     Documentation filed: Notes Section, Follow-up Section, LOS Section, Visit Diagnoses

## 2014-03-22 ENCOUNTER — Other Ambulatory Visit (HOSPITAL_COMMUNITY): Payer: Self-pay | Admitting: *Deleted

## 2014-03-22 MED ORDER — CARVEDILOL 12.5 MG PO TABS: 12.5000 mg | ORAL_TABLET | Freq: Two times a day (BID) | ORAL | Status: DC

## 2014-04-01 ENCOUNTER — Encounter: Payer: Self-pay | Admitting: Nurse Practitioner

## 2014-04-01 ENCOUNTER — Ambulatory Visit (INDEPENDENT_AMBULATORY_CARE_PROVIDER_SITE_OTHER): Payer: 59 | Admitting: Nurse Practitioner

## 2014-04-01 VITALS — BP 110/60 | HR 68 | Ht 62.0 in | Wt 128.8 lb

## 2014-04-01 DIAGNOSIS — I5022 Chronic systolic (congestive) heart failure: Secondary | ICD-10-CM | POA: Diagnosis not present

## 2014-04-01 DIAGNOSIS — R001 Bradycardia, unspecified: Secondary | ICD-10-CM

## 2014-04-01 LAB — MDC_IDC_ENUM_SESS_TYPE_INCLINIC
Battery Remaining Longevity: 88.8 mo
Battery Voltage: 2.98 V
Date Time Interrogation Session: 20160324134731
Implantable Pulse Generator Serial Number: 2936617
Lead Channel Impedance Value: 375 Ohm
Lead Channel Impedance Value: 462.5 Ohm
Lead Channel Pacing Threshold Amplitude: 0.75 V
Lead Channel Pacing Threshold Amplitude: 1 V
Lead Channel Pacing Threshold Pulse Width: 0.4 ms
Lead Channel Pacing Threshold Pulse Width: 0.4 ms
Lead Channel Sensing Intrinsic Amplitude: 8.6 mV
Lead Channel Setting Pacing Amplitude: 2 V
Lead Channel Setting Pacing Amplitude: 2.5 V
MDC IDC MSMT LEADCHNL LV IMPEDANCE VALUE: 887.5 Ohm
MDC IDC MSMT LEADCHNL LV PACING THRESHOLD PULSEWIDTH: 1 ms
MDC IDC MSMT LEADCHNL RA PACING THRESHOLD AMPLITUDE: 0.5 V
MDC IDC MSMT LEADCHNL RA SENSING INTR AMPL: 2.2 mV
MDC IDC SET LEADCHNL LV PACING PULSEWIDTH: 1 ms
MDC IDC SET LEADCHNL RA PACING AMPLITUDE: 2 V
MDC IDC SET LEADCHNL RV PACING PULSEWIDTH: 0.4 ms
MDC IDC SET LEADCHNL RV SENSING SENSITIVITY: 2 mV
MDC IDC STAT BRADY RA PERCENT PACED: 32 %
MDC IDC STAT BRADY RV PERCENT PACED: 99.84 %

## 2014-04-01 NOTE — Progress Notes (Signed)
Electrophysiology Office Note Date: 04/01/2014  ID:  Angela, Shepherd Oct 01, 1950, MRN [id number]  PCP: Annye Asa, MD Primary Cardiologist: [place] Electrophysiologist: [place]  CC: CRTP follow-up with recent VV optimization  Angela Shepherd is a 64 y.o. female is seen today for Dr Rayann Heman.  She presents today for routine electrophysiology followup.  Since V-V optimization 02-22-14, she reports that she is somewhat improved. She has been able to exercise on the treadmill for 30 minutes at a time which is an improvement from 20 minutes.  She has also required less Lasix.  She denies chest pain, palpitations, dyspnea, PND, orthopnea, nausea, vomiting, dizziness, syncope, edema, weight gain, or early satiety.  Last echo 08/2013 demonstrated EF 30%, hypokinesis of inferior wall, posterior wall; akinesis of distal lateral wall, grade 1 diastolic dysfunction, trivial AR  Device History: STJ CRTP implanted 05/2012 for 1st degree AV block and intermittent Mobitz II with non-ischemic cardiomyopathy.     Past Medical History  Diagnosis Date  . Hypertrophic obstructive cardiomyopathy   . Hypokalemia   . Chronic systolic CHF (congestive heart failure)     a. EF 30-35% dx in 2011 -> most recent 40-45% by echo 04/2012. b. s/p upgrade to BiV-PPM 05/08/12. c. Med titration limited by hypotension.  . Bradycardia     a. initial PPM placed 1994 at [facility] with gen change 2002. b. 1*AVB & intermittent 2nd degree AVB + CHF --> upgrade to [facility]. Jude BiV PPM 05/08/12.  . Pneumonia 2008  . Cluster headache syndrome   . Hypotension    Past Surgical History  Procedure Laterality Date  . Tonsillectomy  ~ 1959  . Tubal ligation  1984?  Marland Kitchen Pacemaker insertion  1994; 2002; 05/08/2012  . Bi-ventricular pacemaker upgrade N/A 05/08/2012    upgrade to SJM Anthem (CRT-P) by Dr Rayann Heman    Current Outpatient Prescriptions  Medication Sig Dispense Refill  . carvedilol (COREG) 12.5 MG tablet Take 1 tablet  (12.5 mg total) by mouth 2 (two) times daily with a meal. 60 tablet 12  . diclofenac sodium (VOLTAREN) 1 % GEL Apply 4 g topically 4 (four) times daily. 100 g 3  . escitalopram (LEXAPRO) 10 MG tablet Take 1 tablet (10 mg total) by mouth daily. 30 tablet 6  . furosemide (LASIX) 40 MG tablet Take 40 mg by mouth daily. Take 1 extra tablet by mouth as needed for weight gain of 3lbs or more    . lisinopril (PRINIVIL,ZESTRIL) 10 MG tablet Take 1 tablet (10 mg total) by mouth daily. 90 tablet 1  . nicotine (NICODERM CQ - DOSED IN MG/24 HR) 7 mg/24hr patch Place 1 patch onto the skin daily.    . predniSONE (DELTASONE) 10 MG tablet 3 tabs x3 days and then 2 tabs x3 days and then 1 tab x3 days.  Take w/ food. 18 tablet 0  . spironolactone (ALDACTONE) 25 MG tablet TAKE 1/2 TABLET BY MOUTH DAILY 15 tablet 6   No current facility-administered medications for this visit.    Allergies:   Review of patient's allergies indicates no known allergies.   Social History: History   Social History  . Marital Status: Widowed    Spouse Name: N/A  . Number of Children: 2  . Years of Education: N/A   Occupational History  . [employer]    Social History Main Topics  . Smoking status: Former Smoker -- 0.30 packs/day for 10 years    Types: Cigarettes    Quit date:  02/21/2010  . Smokeless tobacco: Never Used  . Alcohol Use: No  . Drug Use: No  . Sexual Activity: No   Other Topics Concern  . Not on file   Social History Narrative    Family History: Family History  Problem Relation Age of Onset  . Diabetes    . Cardiomyopathy    . Heart disease Sister   . Colon cancer Neg Hx   . Esophageal cancer Neg Hx   . Stomach cancer Neg Hx   . Rectal cancer Neg Hx   . Cancer Sister 48    uterine     Review of Systems: General: No chills, fever, night sweats or weight changes  Cardiovascular:  No chest pain, + dyspnea on exertion (improved), no edema, orthopnea, palpitations, paroxysmal  nocturnal dyspnea  Dermatological: No rash, lesions or masses Respiratory: No cough, dyspnea Urologic: No hematuria, dysuria Abdominal: No nausea, vomiting, diarrhea, bright red blood per rectum, melena, or hematemesis Neurologic: No visual changes, weakness, changes in mental status All other systems reviewed and are otherwise negative except as noted above.   Physical Exam: VS:  BP 110/60 mmHg  Pulse 68  Ht 5\' 2"  (1.575 m)  Wt 128 lb 12.8 oz (58.423 kg)  BMI 23.55 kg/m2 , BMI Body mass index is 23.55 kg/(m^2).  GEN- The patient is well appearing, alert and oriented x 3 today.   HEENT: normocephalic, atraumatic; sclera clear, conjunctiva pink; hearing intact; oropharynx clear; neck supple  Lymph- no cervical lymphadenopathy Lungs- Normal work of breathing, CTAB Heart- Regular rate and rhythm, no murmurs, rubs or gallops  GI- soft, non-tender, non-distended, bowel sounds present Extremities- no clubbing, cyanosis, or edema; DP/PT/radial pulses 2+ bilaterally MS- no significant deformity or atrophy Skin- warm and dry, no rash or lesion; PPM pocket well healed Psych- euthymic mood, full affect Neuro- strength and sensation are intact  PPM Interrogation- reviewed in detail today,  See PACEART report  EKG:  EKG is not ordered today.  Recent Labs: 05/29/2013: ALT 15; Hemoglobin 13.8; Platelets 230; TSH 1.487 09/03/2013: Pro B Natriuretic peptide (BNP) 958.2* 02/09/2014: B Natriuretic Peptide 248.9*; BUN 31*; Creatinine 1.15*; Potassium 4.4; Sodium 138   Wt Readings from Last 3 Encounters:  04/01/14 128 lb 12.8 oz (58.423 kg)  02/22/14 132 lb 12.8 oz (60.238 kg)  12/11/13 128 lb 6 oz (58.231 kg)     Other studies Reviewed: Additional studies/ records that were reviewed today include: Dr Jackalyn Lombard last office notes    Assessment and Plan:  1.  Chronic systolic heart failure The patient reports some improvement s/p V-V optimization.  She feels that she is still limited by  shortness of breath.  Will arrange AV optimization echo with prolonged 1st degree AV block Will discuss with Dr Rayann Heman role of CPX testing if AV optimization does not improve dyspnea with underlying COPD Normal PPM function See Pace Art report No changes today   Current medicines are reviewed at length with the patient today.   The patient does not have concerns regarding her medicines.  The following changes were made today:  none  Labs/ tests ordered today include:  Orders Placed This Encounter  Procedures  . 2D Echocardiogram without contrast     Disposition:   Follow up with AV optimization echo   Signed, Chanetta Marshall, NP 04/01/2014 2:23 PM  [address] [place] [address] [phone] (office) [phone] (fax

## 2014-04-01 NOTE — Patient Instructions (Addendum)
Your physician recommends that you continue on your current medications as directed. Please refer to the Current Medication list given to you today.  Your physician has requested that you have an AV Optimization echocardiogram. Echocardiography is a painless test that uses sound waves to create images of your heart. It provides your doctor with information about the size and shape of your heart and how well your heart's chambers and valves are working. This procedure takes approximately one hour. There are no restrictions for this procedure.

## 2014-04-10 ENCOUNTER — Inpatient Hospital Stay (HOSPITAL_COMMUNITY)
Admission: EM | Admit: 2014-04-10 | Discharge: 2014-04-13 | DRG: 291 | Disposition: A | Discharge status: Home / Self Care | Payer: 59 | Attending: Internal Medicine | Admitting: Internal Medicine

## 2014-04-10 ENCOUNTER — Emergency Department (HOSPITAL_COMMUNITY): Payer: 59

## 2014-04-10 ENCOUNTER — Other Ambulatory Visit: Payer: Self-pay

## 2014-04-10 ENCOUNTER — Encounter (HOSPITAL_COMMUNITY): Payer: Self-pay | Admitting: Emergency Medicine

## 2014-04-10 ENCOUNTER — Other Ambulatory Visit (HOSPITAL_COMMUNITY): Payer: Self-pay

## 2014-04-10 DIAGNOSIS — Z950 Presence of cardiac pacemaker: Secondary | ICD-10-CM

## 2014-04-10 DIAGNOSIS — J449 Chronic obstructive pulmonary disease, unspecified: Secondary | ICD-10-CM | POA: Diagnosis present

## 2014-04-10 DIAGNOSIS — F17210 Nicotine dependence, cigarettes, uncomplicated: Secondary | ICD-10-CM | POA: Diagnosis present

## 2014-04-10 DIAGNOSIS — I421 Obstructive hypertrophic cardiomyopathy: Secondary | ICD-10-CM | POA: Diagnosis present

## 2014-04-10 DIAGNOSIS — D72829 Elevated white blood cell count, unspecified: Secondary | ICD-10-CM | POA: Diagnosis present

## 2014-04-10 DIAGNOSIS — G4489 Other headache syndrome: Secondary | ICD-10-CM | POA: Diagnosis present

## 2014-04-10 DIAGNOSIS — R4182 Altered mental status, unspecified: Secondary | ICD-10-CM

## 2014-04-10 DIAGNOSIS — J9601 Acute respiratory failure with hypoxia: Secondary | ICD-10-CM

## 2014-04-10 DIAGNOSIS — G9340 Encephalopathy, unspecified: Secondary | ICD-10-CM | POA: Diagnosis present

## 2014-04-10 DIAGNOSIS — G43909 Migraine, unspecified, not intractable, without status migrainosus: Secondary | ICD-10-CM | POA: Diagnosis present

## 2014-04-10 DIAGNOSIS — N179 Acute kidney failure, unspecified: Secondary | ICD-10-CM | POA: Diagnosis present

## 2014-04-10 DIAGNOSIS — E875 Hyperkalemia: Secondary | ICD-10-CM | POA: Diagnosis present

## 2014-04-10 DIAGNOSIS — Z7901 Long term (current) use of anticoagulants: Secondary | ICD-10-CM | POA: Diagnosis not present

## 2014-04-10 DIAGNOSIS — I420 Dilated cardiomyopathy: Secondary | ICD-10-CM | POA: Diagnosis present

## 2014-04-10 DIAGNOSIS — I10 Essential (primary) hypertension: Secondary | ICD-10-CM | POA: Diagnosis present

## 2014-04-10 DIAGNOSIS — I509 Heart failure, unspecified: Secondary | ICD-10-CM

## 2014-04-10 DIAGNOSIS — R0602 Shortness of breath: Secondary | ICD-10-CM | POA: Diagnosis not present

## 2014-04-10 DIAGNOSIS — R778 Other specified abnormalities of plasma proteins: Secondary | ICD-10-CM

## 2014-04-10 DIAGNOSIS — I5023 Acute on chronic systolic (congestive) heart failure: Principal | ICD-10-CM

## 2014-04-10 DIAGNOSIS — I5021 Acute systolic (congestive) heart failure: Secondary | ICD-10-CM | POA: Diagnosis not present

## 2014-04-10 DIAGNOSIS — E876 Hypokalemia: Secondary | ICD-10-CM | POA: Diagnosis present

## 2014-04-10 DIAGNOSIS — R7989 Other specified abnormal findings of blood chemistry: Secondary | ICD-10-CM | POA: Diagnosis not present

## 2014-04-10 DIAGNOSIS — Z8669 Personal history of other diseases of the nervous system and sense organs: Secondary | ICD-10-CM

## 2014-04-10 DIAGNOSIS — R0600 Dyspnea, unspecified: Secondary | ICD-10-CM | POA: Diagnosis not present

## 2014-04-10 DIAGNOSIS — Z8701 Personal history of pneumonia (recurrent): Secondary | ICD-10-CM | POA: Diagnosis not present

## 2014-04-10 LAB — BASIC METABOLIC PANEL
ANION GAP: 14 (ref 5–15)
BUN: 19 mg/dL (ref 6–23)
CO2: 18 mmol/L — ABNORMAL LOW (ref 19–32)
Calcium: 8.6 mg/dL (ref 8.4–10.5)
Chloride: 105 mmol/L (ref 96–112)
Creatinine, Ser: 1.34 mg/dL — ABNORMAL HIGH (ref 0.50–1.10)
GFR calc non Af Amer: 41 mL/min — ABNORMAL LOW (ref >90)
GFR, EST AFRICAN AMERICAN: 48 mL/min — AB (ref >90)
Glucose, Bld: 341 mg/dL — ABNORMAL HIGH (ref 70–99)
POTASSIUM: 5.4 mmol/L — AB (ref 3.5–5.1)
Sodium: 137 mmol/L (ref 135–145)

## 2014-04-10 LAB — RAPID URINE DRUG SCREEN, HOSP PERFORMED
Amphetamines: NOT DETECTED
Barbiturates: NOT DETECTED
Benzodiazepines: NOT DETECTED
Cocaine: NOT DETECTED
Opiates: NOT DETECTED
TETRAHYDROCANNABINOL: NOT DETECTED

## 2014-04-10 LAB — I-STAT ARTERIAL BLOOD GAS, ED
ACID-BASE DEFICIT: 6 mmol/L — AB (ref 0.0–2.0)
Bicarbonate: 20.3 mEq/L (ref 20.0–24.0)
O2 SAT: 90 %
PO2 ART: 65 mmHg — AB (ref 80.0–100.0)
Patient temperature: 98.6
TCO2: 22 mmol/L (ref 0–100)
pCO2 arterial: 41 mmHg (ref 35.0–45.0)
pH, Arterial: 7.303 — ABNORMAL LOW (ref 7.350–7.450)

## 2014-04-10 LAB — URINALYSIS, ROUTINE W REFLEX MICROSCOPIC
Bilirubin Urine: NEGATIVE
Glucose, UA: 100 mg/dL — AB
HGB URINE DIPSTICK: NEGATIVE
KETONES UR: NEGATIVE mg/dL
Leukocytes, UA: NEGATIVE
NITRITE: NEGATIVE
Protein, ur: 100 mg/dL — AB
Specific Gravity, Urine: 1.026 (ref 1.005–1.030)
Urobilinogen, UA: 0.2 mg/dL (ref 0.0–1.0)
pH: 5.5 (ref 5.0–8.0)

## 2014-04-10 LAB — CBC WITH DIFFERENTIAL/PLATELET
BASOS ABS: 0 10*3/uL (ref 0.0–0.1)
Basophils Relative: 0 % (ref 0–1)
EOS ABS: 0 10*3/uL (ref 0.0–0.7)
Eosinophils Relative: 0 % (ref 0–5)
HCT: 39.1 % (ref 36.0–46.0)
Hemoglobin: 12.8 g/dL (ref 12.0–15.0)
LYMPHS ABS: 1.7 10*3/uL (ref 0.7–4.0)
LYMPHS PCT: 12 % (ref 12–46)
MCH: 32.9 pg (ref 26.0–34.0)
MCHC: 32.7 g/dL (ref 30.0–36.0)
MCV: 100.5 fL — ABNORMAL HIGH (ref 78.0–100.0)
Monocytes Absolute: 0.5 10*3/uL (ref 0.1–1.0)
Monocytes Relative: 3 % (ref 3–12)
Neutro Abs: 12 10*3/uL — ABNORMAL HIGH (ref 1.7–7.7)
Neutrophils Relative %: 85 % — ABNORMAL HIGH (ref 43–77)
Platelets: 193 10*3/uL (ref 150–400)
RBC: 3.89 MIL/uL (ref 3.87–5.11)
RDW: 14.2 % (ref 11.5–15.5)
WBC: 14.2 10*3/uL — AB (ref 4.0–10.5)

## 2014-04-10 LAB — URINE MICROSCOPIC-ADD ON

## 2014-04-10 LAB — BRAIN NATRIURETIC PEPTIDE: B Natriuretic Peptide: 2728.1 pg/mL — ABNORMAL HIGH (ref 0.0–100.0)

## 2014-04-10 LAB — CBC
HEMATOCRIT: 36.6 % (ref 36.0–46.0)
Hemoglobin: 11.9 g/dL — ABNORMAL LOW (ref 12.0–15.0)
MCH: 31.7 pg (ref 26.0–34.0)
MCHC: 32.5 g/dL (ref 30.0–36.0)
MCV: 97.6 fL (ref 78.0–100.0)
PLATELETS: 161 10*3/uL (ref 150–400)
RBC: 3.75 MIL/uL — AB (ref 3.87–5.11)
RDW: 14.2 % (ref 11.5–15.5)
WBC: 11 10*3/uL — ABNORMAL HIGH (ref 4.0–10.5)

## 2014-04-10 LAB — CREATININE, SERUM
Creatinine, Ser: 1.17 mg/dL — ABNORMAL HIGH (ref 0.50–1.10)
GFR, EST AFRICAN AMERICAN: 57 mL/min — AB (ref >90)
GFR, EST NON AFRICAN AMERICAN: 49 mL/min — AB (ref >90)

## 2014-04-10 LAB — AMMONIA: AMMONIA: 34 umol/L — AB (ref 11–32)

## 2014-04-10 LAB — TROPONIN I
Troponin I: 0.05 ng/mL — ABNORMAL HIGH (ref <0.031)
Troponin I: 0.09 ng/mL — ABNORMAL HIGH (ref <0.031)
Troponin I: 0.09 ng/mL — ABNORMAL HIGH (ref <0.031)

## 2014-04-10 LAB — ACETAMINOPHEN LEVEL: Acetaminophen (Tylenol), Serum: <10 ug/mL — ABNORMAL LOW (ref 10–30)

## 2014-04-10 LAB — SALICYLATE LEVEL: Salicylate Lvl: <4 mg/dL (ref 2.8–20.0)

## 2014-04-10 MED ORDER — CARVEDILOL 12.5 MG PO TABS
12.5000 mg | ORAL_TABLET | Freq: Two times a day (BID) | ORAL | Status: DC
  Administered 2014-04-11: 12.5 mg via ORAL
  Filled 2014-04-10 (×5): qty 1

## 2014-04-10 MED ORDER — ACETAMINOPHEN 325 MG PO TABS: 650.0000 mg | ORAL_TABLET | ORAL | Status: DC | PRN

## 2014-04-10 MED ORDER — HEPARIN SODIUM (PORCINE) 5000 UNIT/ML IJ SOLN
5000.0000 [IU] | Freq: Three times a day (TID) | INTRAMUSCULAR | Status: DC
  Administered 2014-04-10 – 2014-04-12 (×6): 5000 [IU] via SUBCUTANEOUS
  Filled 2014-04-10 (×10): qty 1

## 2014-04-10 MED ORDER — FUROSEMIDE 10 MG/ML IJ SOLN
40.0000 mg | Freq: Four times a day (QID) | INTRAMUSCULAR | Status: DC
  Filled 2014-04-10: qty 4

## 2014-04-10 MED ORDER — ESCITALOPRAM OXALATE 10 MG PO TABS: 10.0000 mg | ORAL_TABLET | Freq: Every day | ORAL | Status: DC

## 2014-04-10 MED ORDER — SODIUM CHLORIDE 0.9 % IJ SOLN: 3.0000 mL | INTRAMUSCULAR | Status: DC | PRN

## 2014-04-10 MED ORDER — ESCITALOPRAM OXALATE 10 MG PO TABS
10.0000 mg | ORAL_TABLET | Freq: Every day | ORAL | Status: DC
  Administered 2014-04-10 – 2014-04-13 (×4): 10 mg via ORAL
  Filled 2014-04-10 (×4): qty 1

## 2014-04-10 MED ORDER — IPRATROPIUM-ALBUTEROL 0.5-2.5 (3) MG/3ML IN SOLN
3.0000 mL | Freq: Once | RESPIRATORY_TRACT | Status: AC
  Administered 2014-04-10: 3 mL via RESPIRATORY_TRACT
  Filled 2014-04-10: qty 3

## 2014-04-10 MED ORDER — ONDANSETRON HCL 4 MG/2ML IJ SOLN
4.0000 mg | Freq: Four times a day (QID) | INTRAMUSCULAR | Status: DC | PRN
Start: 2014-04-10 — End: 2014-04-13

## 2014-04-10 MED ORDER — FUROSEMIDE 10 MG/ML IJ SOLN: 40.0000 mg | Freq: Four times a day (QID) | INTRAMUSCULAR | Status: DC

## 2014-04-10 MED ORDER — SODIUM CHLORIDE 0.9 % IV SOLN: 250.0000 mL | INTRAVENOUS | Status: DC | PRN

## 2014-04-10 MED ORDER — FUROSEMIDE 10 MG/ML IJ SOLN
60.0000 mg | Freq: Once | INTRAMUSCULAR | Status: AC
  Administered 2014-04-10: 60 mg via INTRAVENOUS
  Filled 2014-04-10: qty 6

## 2014-04-10 MED ORDER — FUROSEMIDE 10 MG/ML IJ SOLN
8.0000 mg/h | INTRAVENOUS | Status: DC
  Administered 2014-04-11: 4 mg/h via INTRAVENOUS
  Filled 2014-04-10 (×2): qty 25

## 2014-04-10 MED ORDER — ASPIRIN EC 81 MG PO TBEC
81.0000 mg | DELAYED_RELEASE_TABLET | Freq: Every day | ORAL | Status: DC
  Administered 2014-04-10 – 2014-04-13 (×4): 81 mg via ORAL
  Filled 2014-04-10 (×4): qty 1

## 2014-04-10 MED ORDER — SODIUM CHLORIDE 0.9 % IJ SOLN
3.0000 mL | Freq: Two times a day (BID) | INTRAMUSCULAR | Status: DC
  Administered 2014-04-10 – 2014-04-13 (×5): 3 mL via INTRAVENOUS

## 2014-04-10 MED ORDER — NALOXONE HCL 0.4 MG/ML IJ SOLN
INTRAMUSCULAR | Status: AC
  Administered 2014-04-10: 0.04 mg
  Filled 2014-04-10: qty 1

## 2014-04-10 MED ORDER — ASPIRIN EC 81 MG PO TBEC: 81.0000 mg | DELAYED_RELEASE_TABLET | Freq: Every day | ORAL | Status: DC

## 2014-04-10 MED ORDER — SODIUM POLYSTYRENE SULFONATE 15 GM/60ML PO SUSP: 15.0000 g | Freq: Once | ORAL | Status: DC

## 2014-04-10 MED ORDER — SODIUM POLYSTYRENE SULFONATE 15 GM/60ML PO SUSP
15.0000 g | Freq: Once | ORAL | Status: AC
  Administered 2014-04-10: 15 g via ORAL
  Filled 2014-04-10: qty 60

## 2014-04-10 NOTE — ED Provider Notes (Signed)
CSN: [id number]     Arrival date & time 04/10/14  1050 History   First MD Initiated Contact with Patient 04/10/14 1100     Chief Complaint  Patient presents with  . Shortness of Breath  . Altered Mental Status     (Consider location/radiation/quality/duration/timing/severity/associated sxs/prior Treatment) HPI Comments: 64 year old female who presents with decreased level of consciousness. EMS reported that the only thing she would say was that she feels short of breath. On arrival to the emergency department, nursing was unable to arouse her. She was arousable by me with deep sternal rub. Initially all that she would say was that she was short of breath. Level V caveat applies due to altered mental status.  Patient is a 64 y.o. female presenting with shortness of breath and altered mental status.  Shortness of Breath Severity:  Severe Onset quality:  Gradual Duration:  1 week Timing:  Constant Progression:  Worsening Chronicity:  Recurrent Context comment:  Reports having an echocardiogram 3 days ago Relieved by:  Nothing Worsened by:  Nothing tried Associated symptoms: no cough and no fever   Associated symptoms comment:  Orthopnea, dyspnea on exertion Altered Mental Status Presenting symptoms: unresponsiveness   Severity:  Severe Most recent episode:  Today Context comment:  She was meeting a friend in a parking lot, but was unresponsive in her own car when friend showed up. Associated symptoms: no fever     Past Medical History  Diagnosis Date  . Pacemaker    No past surgical history on file. No family history on file. History  Substance Use Topics  . Smoking status: Not on file  . Smokeless tobacco: Not on file  . Alcohol Use: Not on file   OB History    No data available     Review of Systems  Unable to perform ROS: Mental status change  Constitutional: Negative for fever.  Respiratory: Positive for shortness of breath. Negative for cough.        Allergies  Review of patient's allergies indicates no known allergies.  Home Medications   Prior to Admission medications   Not on File   SpO2 100%  LMP  (LMP Unknown) Physical Exam  Constitutional: She appears well-developed and well-nourished. She appears lethargic. No distress.  HENT:  Head: Normocephalic and atraumatic.  Mouth/Throat: Oropharynx is clear and moist.  Eyes: Conjunctivae are normal. Pupils are equal, round, and reactive to light. No scleral icterus.  Neck: Neck supple.  Cardiovascular: Normal rate, regular rhythm, normal heart sounds and intact distal pulses.   No murmur heard. Pulmonary/Chest: Effort normal. No stridor. No respiratory distress. She has wheezes (mild, anterior lung fields). She has rales (Right middle and right lower lung fields).  Abdominal: Soft. Bowel sounds are normal. She exhibits no distension. There is no tenderness.  Musculoskeletal: Normal range of motion. She exhibits no edema.  Neurological: She appears lethargic. GCS eye subscore is 2. GCS verbal subscore is 4. GCS motor subscore is 6.  Skin: Skin is warm and dry. No rash noted.  Psychiatric: She has a normal mood and affect. Her behavior is normal.  Nursing note and vitals reviewed.   ED Course  CRITICAL CARE Performed by: Serita Grit Authorized by: Serita Grit Total critical care time: 40 minutes Critical care time was exclusive of separately billable procedures and treating other patients. Critical care was necessary to treat or prevent imminent or life-threatening deterioration of the following conditions: CNS failure or compromise, cardiac failure and respiratory failure. Critical care  was time spent personally by me on the following activities: development of treatment plan with patient or surrogate, discussions with consultants, evaluation of patient's response to treatment, examination of patient, obtaining history from patient or surrogate, ordering and  performing treatments and interventions, ordering and review of laboratory studies, ordering and review of radiographic studies, pulse oximetry, re-evaluation of patient's condition and review of old charts.   (including critical care time) Labs Review Labs Reviewed  CBC WITH DIFFERENTIAL/PLATELET - Abnormal; Notable for the following:    WBC 14.2 (*)    MCV 100.5 (*)    Neutrophils Relative % 85 (*)    Neutro Abs 12.0 (*)    All other components within normal limits  BASIC METABOLIC PANEL - Abnormal; Notable for the following:    Potassium 5.4 (*)    CO2 18 (*)    Glucose, Bld 341 (*)    Creatinine, Ser 1.34 (*)    GFR calc non Af Amer 41 (*)    GFR calc Af Amer 48 (*)    All other components within normal limits  TROPONIN I - Abnormal; Notable for the following:    Troponin I 0.05 (*)    All other components within normal limits  URINALYSIS, ROUTINE W REFLEX MICROSCOPIC - Abnormal; Notable for the following:    Glucose, UA 100 (*)    Protein, ur 100 (*)    All other components within normal limits  AMMONIA - Abnormal; Notable for the following:    Ammonia 34 (*)    All other components within normal limits  ACETAMINOPHEN LEVEL - Abnormal; Notable for the following:    Acetaminophen (Tylenol), Serum <10.0 (*)    All other components within normal limits  BRAIN NATRIURETIC PEPTIDE - Abnormal; Notable for the following:    B Natriuretic Peptide 2728.1 (*)    All other components within normal limits  URINE MICROSCOPIC-ADD ON - Abnormal; Notable for the following:    Casts HYALINE CASTS (*)    All other components within normal limits  I-STAT ARTERIAL BLOOD GAS, ED - Abnormal; Notable for the following:    pH, Arterial 7.303 (*)    pO2, Arterial 65.0 (*)    Acid-base deficit 6.0 (*)    All other components within normal limits  URINE RAPID DRUG SCREEN (HOSP PERFORMED)  SALICYLATE LEVEL  BLOOD GAS, ARTERIAL    Imaging Review Dg Chest Port 1 View  04/10/2014   CLINICAL  DATA:  Shortness of breath, hypoxia, pacemaker  EXAM: PORTABLE CHEST - 1 VIEW  COMPARISON:  None.  FINDINGS: Left subclavian 3 lead pacemaker noted. Heart is enlarged with diffuse increased interstitial opacities compatible with mild interstitial edema. Small pleural effusion versus pleural thickening on the right. Right mid lung scarring versus atelectasis evident. No pneumothorax. Trachea midline.  IMPRESSION: Cardiomegaly with mild interstitial edema pattern  Diffuse right pleural thickening versus effusion  Right midlung atelectasis versus scarring   Electronically Signed   By: Jerilynn Mages.  Shick M.D.   On: 04/10/2014 12:48  All radiology studies independently viewed by me.      EKG Interpretation None     EKG - AV paced rhythm, rate 88.    MDM   Final diagnoses:  Shortness of breath  Altered mental status, unspecified altered mental status type  Acute respiratory failure with hypoxia  Congestive heart failure, unspecified congestive heart failure chronicity, unspecified congestive heart failure type    64 year old female presenting with altered mental status with the chief complaint of shortness  of breath. Initially difficult to arouse. Given 0.04 mg of Narcan by me which seemed improve her altered mental status slightly. Subsequently, she was able to give some history, reporting shortness of breath for 1 week which she believes is related to her CHF.  Will require close monitoring and may need intubation due to somnolence.     3:35 PM Shortly after arrival, on re-exam, pt was diaphoretic with increased work of breathing and had diffuse wheezing with poor air movement.  She continued to be lethargic.  Then, after a single breathing treatment, she appeared much better, was alert and oriented, and was breathing easy.  Her ED workup was more consistent with CHF, but she improved rapidly with beta agonists.  The source of her altered mental status is not clearly evident.  Reglardless, she will require  admission.  Internal Medicine will admit    Serita Grit, MD 04/10/14 1540

## 2014-04-10 NOTE — ED Notes (Signed)
Port chest at bedside

## 2014-04-10 NOTE — H&P (Signed)
[facility] History and Physical  Nabiha Planck IRS:[id number] DOB: Dec 15, 1950 DOA: 04/10/2014  Referring physician: Dr. Patriciaann Clan PCP: Annye Asa, MD   Chief Complaint: confusion  HPI: Angela Shepherd is a 64 y.o. 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 She has a h/o HOCM, bradycardia s/p upgrade to BiV-PPM 05/08/12, PMHx also notable for HTN and  tobacco abuse  ECHO 09/03/13: EF 40-45% she comes in for confusion. She related that when she woke up this morning she was short of breath going on her car and when she got to her destination she got out of the car and got confused and more shortness of breath got worse she relates she doesn't remember EMS got there. They were unable to arouse her. She was fever cough shortness of breath sick contact chest pain or diarrhea. She relates that her Lasix dose was reduced about 2 months ago and she has been consuming more [place] usual.  In the ED: A chest x-ray was done that showed interstitial infiltrates, BMP was done that was elevated 2700, with new acute renal failure hyperkalemia and leukocytosis. An ABG was done that showed a pH of 7.3/41/65 consulted for further evaluation.   Review of Systems:  Constitutional:  No weight loss, night sweats, Fevers, chills, fatigue.  HEENT:  No headaches, Difficulty swallowing,Tooth/dental problems,Sore throat,  No sneezing, itching, ear ache, nasal congestion, post nasal drip,  Cardio-vascular:  No chest pain, Orthopnea, PND, swelling in lower extremities, anasarca, dizziness, palpitations  GI:  No heartburn, indigestion, abdominal pain, nausea, vomiting, diarrhea, change in bowel habits, loss of appetite  Resp:   No excess mucus, no productive cough, No non-productive cough, No coughing up of blood.No change in color of mucus.No wheezing.No chest wall deformity  Skin:  no rash or lesions.  GU:  no dysuria, change in color of urine, no urgency or frequency. No  flank pain.  Musculoskeletal:  No joint pain or swelling. No decreased range of motion. No back pain.  Psych:  No change in mood or affect. No depression or anxiety. No memory loss.    Past Medical History  Diagnosis Date  . Hypertrophic obstructive cardiomyopathy   . Hypokalemia   . Chronic systolic CHF (congestive heart failure)     a. EF 30-35% dx in 2011 -> most recent 40-45% by echo 04/2012. b. s/p upgrade to BiV-PPM 05/08/12. c. Med titration limited by hypotension.  . Bradycardia     a. initial PPM placed 1994 at [facility] with gen change 2002. b. 1*AVB & intermittent 2nd degree AVB + CHF --> upgrade to [facility]. Jude BiV PPM 05/08/12.  . Pneumonia 2008  . Cluster headache syndrome   . Hypotension         History reviewed. No pertinent past surgical history. Social History:  reports that she has been smoking.  She does not have any smokeless tobacco history on file. Her alcohol and drug histories are not on file.  No Known Allergies  Family History  Problem Relation Age of Onset  . Dementia Mother    Father died in the motor vehicle accident.  Prior to Admission medications   Medication Sig Start Date End Date Taking? Authorizing Provider  carvedilol (COREG) 12.5 MG tablet Take 12.5 mg by mouth 2 (two) times daily.   Yes Historical Provider, MD  escitalopram (LEXAPRO) 10 MG tablet Take 10 mg by mouth daily.   Yes Historical Provider, MD  furosemide (LASIX) 40 MG tablet Take 40 mg by mouth daily.  Yes Historical Provider, MD  lisinopril (PRINIVIL,ZESTRIL) 10 MG tablet Take 10 mg by mouth daily.   Yes Historical Provider, MD  spironolactone (ALDACTONE) 25 MG tablet Take 12.5 mg by mouth daily.   Yes Historical Provider, MD   Physical Exam: Filed Vitals:   04/10/14 1402 04/10/14 1416 04/10/14 1430 04/10/14 1500  BP: 99/70 91/58 92/58  87/57  Pulse:  60 59 66  Temp:      TempSrc:      Resp:  18 13 21   Height:      Weight:      SpO2:  99% 98% 98%     Wt Readings from Last 3 Encounters:  04/10/14 54.432 kg (120 lb)    General:  Appears calm and comfortable Eyes: PERRL, normal lids, irises & conjunctiva ENT: grossly normal hearing, lips & tongue Neck: no LAD, masses or thyromegaly, positive pedal jugular reflux Cardiovascular: RRR, no m/r/g. No LE edema. Telemetry: SR, no arrhythmias  Respiratory: Good air movement with crackles at her bases bilaterally. Abdomen: soft, ntnd Skin: no rash or induration seen on limited exam Musculoskeletal: grossly normal tone BUE/BLE Psychiatric: grossly normal mood and affect, speech fluent and appropriate Neurologic: grossly non-focal.          Labs on Admission:  Basic Metabolic Panel:  Recent Labs Lab 04/10/14 1105  NA 137  K 5.4*  CL 105  CO2 18*  GLUCOSE 341*  BUN 19  CREATININE 1.34*  CALCIUM 8.6   Liver Function Tests: No results for input(s): AST, ALT, ALKPHOS, BILITOT, PROT, ALBUMIN in the last 168 hours. No results for input(s): LIPASE, AMYLASE in the last 168 hours.  Recent Labs Lab 04/10/14 1135  AMMONIA 34*   CBC:  Recent Labs Lab 04/10/14 1105  WBC 14.2*  NEUTROABS 12.0*  HGB 12.8  HCT 39.1  MCV 100.5*  PLT 193   Cardiac Enzymes:  Recent Labs Lab 04/10/14 1105  TROPONINI 0.05*    BNP (last 3 results)  Recent Labs  04/10/14 1105  BNP 2728.1*    ProBNP (last 3 results) No results for input(s): PROBNP in the last 8760 hours.  CBG: No results for input(s): GLUCAP in the last 168 hours.  Radiological Exams on Admission: Dg Chest Port 1 View  04/10/2014   CLINICAL DATA:  Shortness of breath, hypoxia, pacemaker  EXAM: PORTABLE CHEST - 1 VIEW  COMPARISON:  None.  FINDINGS: Left subclavian 3 lead pacemaker noted. Heart is enlarged with diffuse increased interstitial opacities compatible with mild interstitial edema. Small pleural effusion versus pleural thickening on the right. Right mid lung scarring versus atelectasis evident. No  pneumothorax. Trachea midline.  IMPRESSION: Cardiomegaly with mild interstitial edema pattern  Diffuse right pleural thickening versus effusion  Right midlung atelectasis versus scarring   Electronically Signed   By: Jerilynn Mages.  Shick M.D.   On: 04/10/2014 12:48    EKG: Independently reviewed. Paced rhythm right bundle branch block.  Assessment/Plan Acute respiratory failure with hypoxia due to Acute systolic heart failure: - Saturations are now greater than 95% on 4 L of oxygen, and agree with IV Lasix. Admitted to telemetry. - She relates that her Lasix dose was reduced about 2 months ago. - I will go ahead and continue her Lasix monitor strict I's and O's restrict her diet. - Hold her Aldactone due to her hypokalemia and cycle her cardiac enzymes. - Continue Coreg. - Her initial set of cardiac enzymes is just mildly elevated this could be due to acute decompensated heart failure will  continue to cycle her cardiac troponins. She denies any chest pain.  Acute encephalopathy: - Most like a cause of her encephalopathy is hypoxia. She was put on oxygen in the ED and was given albuterol when I examined her she was lucid able to give a clear history. - She is nonfocal on physical exam, we'll get physical therapy to evaluate her.  Hyperkalemia: - Most likely due to Aldactone, ACE inhibitor and acute renal failure. - I will hold her Aldactone and ACE inhibitor continue her Lasix and recheck a basic metabolic panel in the morning.  AKI (acute kidney injury) - Baseline creatinine less than 1, there is most likely due to acute decompensated heart failure. - Start her on gentle diuresis, hold her Ace inhibitor.  Leukocytosis - She denies cough and fever, she has remained afebrile here in the hospital, I will not start empiric antibiotics at this time.  Code Status: full DVT Prophylaxis:heparin Family Communication: Daughter Disposition Plan: inpatient  Time spent: 2 minutes  Charlynne Cousins [facility] Pager [phone]

## 2014-04-10 NOTE — ED Notes (Addendum)
Increased resp distress; sats down to 78% on NRB. Wheezing noted. Assisted patient to sit high up in the bed and assisted with slowing down breathing and trying to take deep breaths. Began to improve to 80's. Notified Dr. Doy Mince who came to bedside. Notified RT of need for Bipap and ABG. Notified Radiology to get CXR ASAP.

## 2014-04-10 NOTE — Progress Notes (Signed)
04/10/14 Patient coming from emergency room to Rm 5W 37 with Dx of Acute Encepalopathy, IV site 4/2 Lt A/C, Full code,DVT Heparin,Diet 2gm NA,Tele to be placed,Strict I & O's,02 at 4 L N/C . Report from ED from [place].

## 2014-04-10 NOTE — ED Notes (Signed)
Dr. Doy Mince gave 0.04 of Narcan. She responded to sternal rub, answered a few questions, able to open eyes. Very lethargic.

## 2014-04-10 NOTE — ED Notes (Signed)
Arrives to ED unresponsive to all stimuli, respirations even, VSS on NRB mask. EMS reported called to a parking lot where she was sitting in her car, meeting a friend. She would only say she felt like she could not breathe. Friend reported she has cardiac history.

## 2014-04-11 DIAGNOSIS — Z950 Presence of cardiac pacemaker: Secondary | ICD-10-CM

## 2014-04-11 DIAGNOSIS — I420 Dilated cardiomyopathy: Secondary | ICD-10-CM | POA: Diagnosis present

## 2014-04-11 DIAGNOSIS — R0602 Shortness of breath: Secondary | ICD-10-CM

## 2014-04-11 DIAGNOSIS — Z8669 Personal history of other diseases of the nervous system and sense organs: Secondary | ICD-10-CM

## 2014-04-11 LAB — TROPONIN I: Troponin I: 0.07 ng/mL — ABNORMAL HIGH (ref <0.031)

## 2014-04-11 LAB — AMMONIA: Ammonia: 29 umol/L (ref 11–32)

## 2014-04-11 MED ORDER — FUROSEMIDE 10 MG/ML IJ SOLN
40.0000 mg | Freq: Two times a day (BID) | INTRAMUSCULAR | Status: DC
  Administered 2014-04-11: 40 mg via INTRAVENOUS
  Filled 2014-04-11 (×3): qty 4

## 2014-04-11 MED ORDER — NICOTINE 14 MG/24HR TD PT24
14.0000 mg | MEDICATED_PATCH | Freq: Every day | TRANSDERMAL | Status: DC
  Administered 2014-04-11 – 2014-04-13 (×3): 14 mg via TRANSDERMAL
  Filled 2014-04-11 (×3): qty 1

## 2014-04-11 MED ORDER — ZOLPIDEM TARTRATE 5 MG PO TABS
5.0000 mg | ORAL_TABLET | Freq: Once | ORAL | Status: AC
  Administered 2014-04-11: 5 mg via ORAL
  Filled 2014-04-11: qty 1

## 2014-04-11 NOTE — Consult Note (Signed)
CARDIOLOGY CONSULT NOTE   Patient ID: Angela Shepherd MRN: [id number] DOB/AGE: 17-Jan-1950 64 y.o.  Admit Date: 04/10/2014  Primary Physician: Angela Asa, MD  Primary Cardiologist    Allred Reason for Consultation:  Clinical Summary  it is important to note that the patient has to medical record numbers. The majority of the cardiology information is under her other record number, [id number]. I have reviewed the data with her that record number. Angela Shepherd is a 65 y.o.female. Yesterday she was short of breath. Then while ambulating she had decreased consciousness. She was admitted to the hospital. There were signs of volume overload. It was felt that her mental status change may have been related to hypoxia. She has a definite cardiac history and cardiology is consult for further help with her cardiac care.  The patient has a history of a cardiomyopathy with decreased left ventricular function. In some places the term HOCM is used. However I do not see that she has ever had an obstructive cardiomyopathy. 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 She has a history of shortness of breath. 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 She had a pacemaker in the past. She was then upgraded to a biventricular pacer. She's followed carefully in our office by Angela Shepherd. On her last echo of August, 2015 her ejection fraction was 30%. She  saw Angela Shepherd in February, 2016  who made adjustments to her biventricular pacer. She was then seen by Angela Shepherd in the office on March, 2016. It was noted at that time that her shortness of breath was somewhat improved. It was felt that the changes in her pacemaker might be helping. Plans were being made to do a follow-up 2-D echo to see how her left ventricle was performing with the new settings.   The patient admits that she has not been taking care of herself well recently relative to her salt and fluid intake. She has several social  stresses. Her husband passed away last year. She works at [employer]. In addition she has been helping to take care for 77 year old parent. She ate excess ham around the time of Easter and has continued to have ham sandwiches during this week. She knows that her weight has gone up, but she has not weighed herself. She does not develop edema usually. Her sign of volume overload is abdominal distention.   There is a history of COPD. I cannot comment on the severity.   No Known Allergies  Medications Scheduled Medications: . aspirin EC  81 mg Oral Daily  . carvedilol  12.5 mg Oral BID  . escitalopram  10 mg Oral Daily  . heparin  5,000 Units Subcutaneous 3 times per day  . sodium chloride  3 mL Intravenous Q12H     Infusions: . furosemide (LASIX) infusion 8 mg/hr (04/11/14 1110)     PRN Medications:  sodium chloride, acetaminophen, ondansetron (ZOFRAN) IV, sodium chloride   Past Medical History  Diagnosis Date  . Pacemaker     History reviewed. No pertinent past surgical history.  Family History  Problem Relation Age of Onset  . Dementia Mother     Social History Angela Shepherd reports that she has been smoking.  She does not have any smokeless tobacco history on file. Angela Shepherd reports that she drinks about 1.2 oz of alcohol per week.  Review of Systems   Patient denies fever, chills, headache, sweats, rash, change in vision, change in hearing, chest  pain, cough, nausea or vomiting, urinary symptoms. All other systems are reviewed and are negative.   Physical Examination Blood pressure 100/63, pulse 70, temperature 97.1 F (36.2 C), temperature source Oral, resp. rate 20, height 5\' 2"  (1.575 m), weight 128 lb (58.06 kg), SpO2 99 %.  Intake/Output Summary (Last 24 hours) at 04/11/14 1405 Last data filed at 04/11/14 1110  Gross per 24 hour  Intake    522 ml  Output   2550 ml  Net  -2028 ml  The patient feels better now. However she is not back to baseline.  She is lying relatively flat in bed without multiple pillows. Head is atraumatic. Sclera and conjunctiva are normal. There is no jugulovenous distention. Lungs reveal scattered rales at the bases. There is no respiratory distress. Cardiac exam reveals an S1 and S2. The abdomen is soft. There is no significant peripheral edema. There are no musculoskeletal deformities. There are no skin rashes. Old,    Prior Cardiac Testing/Procedures  Lab Results  Basic Metabolic Panel:  Recent Labs Lab 04/10/14 1105 04/10/14 1717  NA 137  --   K 5.4*  --   CL 105  --   CO2 18*  --   GLUCOSE 341*  --   BUN 19  --   CREATININE 1.34* 1.17*  CALCIUM 8.6  --     Liver Function Tests: No results for input(s): AST, ALT, ALKPHOS, BILITOT, PROT, ALBUMIN in the last 168 hours.  CBC:  Recent Labs Lab 04/10/14 1105 04/10/14 1717  WBC 14.2* 11.0*  NEUTROABS 12.0*  --   HGB 12.8 11.9*  HCT 39.1 36.6  MCV 100.5* 97.6  PLT 193 161    Cardiac Enzymes:  Recent Labs Lab 04/10/14 1105 04/10/14 1717 04/10/14 2135 04/11/14 1206  TROPONINI 0.05* 0.09* 0.09* 0.07*    BNP: Invalid input(s): POCBNP   Radiology: Dg Chest Port 1 View  04/10/2014   CLINICAL DATA:  Shortness of breath, hypoxia, pacemaker  EXAM: PORTABLE CHEST - 1 VIEW  COMPARISON:  None.  FINDINGS: Left subclavian 3 lead pacemaker noted. Heart is enlarged with diffuse increased interstitial opacities compatible with mild interstitial edema. Small pleural effusion versus pleural thickening on the right. Right mid lung scarring versus atelectasis evident. No pneumothorax. Trachea midline.  IMPRESSION: Cardiomegaly with mild interstitial edema pattern  Diffuse right pleural thickening versus effusion  Right midlung atelectasis versus scarring   Electronically Signed   By: Jerilynn Mages.  Shick M.D.   On: 04/10/2014 12:48     ECG: I have reviewed the current EKG and prior EKGs. There is a wide QRS. It is upright in V1. It appears appropriate for  biventricular pacing. The tracing appears similar to prior tracings.   Impression and Recommendations    Acute encephalopathy     The patient's mental status is greatly improved from yesterday. It would appear that her mental status probably was related to hypoxia from volume overload.    Acute respiratory failure with hypoxia     This is improving with diuresis.    Acute systolic heart failure      This is improving with diuresis. She's been on a continuous IV Lasix drip. I will change this to standard IV dosing.    Hyperkalemia     Her potassium was a little high and this is being watched. Spironolactone is been put on hold.    AKI (acute kidney injury)        Aldactone has been put on hold  Congestive cardiomyopathy   Status post biventricular pacemaker      The patient had adjustments made to her biventricular pacer in the past several weeks. The plan was to do a follow-up echo to assess further. We will ask our electrophysiology team to see the patient tomorrow to interrogate her unit.     Hx of migraine headaches    Shortness of breath      The patient has ongoing shortness of breath. This is probably a component of her lung disease and volume overload.  The patient's deterioration is probably related to her poor dietary intake recently. She knows that she needs to be more careful in taking care of herself.   Daryel November, MD 04/11/2014, 2:05 PM

## 2014-04-11 NOTE — Progress Notes (Signed)
Pt was transferred to [facility] for further monitoring and care. Pt has CHF and is on lasix drip. Gave report to Doree Albee, Therapist, sports.

## 2014-04-11 NOTE — Progress Notes (Signed)
Received pt as a transfer from [facility] to rm 3E07, oriented to room, call bell placed within reach, pt denied pain at this time. Report given by Haynes Kerns RN.

## 2014-04-11 NOTE — Progress Notes (Addendum)
[facility] PROGRESS NOTE  Angela Shepherd HYI:[id number] DOB: [birth year] DOA: 04/10/2014 PCP: Annye Asa, MD  Assessment/Plan: Active Problems:   Acute encephalopathy   Acute respiratory failure with hypoxia   Acute systolic heart failure   Hyperkalemia   AKI (acute kidney injury)   Leukocytosis  Please note that patient has 2 medical record numbers  Acute respiratory failure with hypoxia due to Acute systolic heart failure: - Saturations are now greater than 95% on 4>2 L of oxygen, Increase IV Lasix to 8 mg per hour, monitor renal function Continue telemetry cardiology consultation has been requested - She relates that her Lasix dose was reduced about 2 months ago By Dr. Jeffie Pollock. - I will go ahead and continue her Lasix monitor strict I's and O's restrict her diet. - Hold her Aldactone due to her  Cardiac enzymes mildly elevated at 0.09 but unchanged - Continue Coreg. Cardiology to see the patient today  Acute encephalopathy: - Most like a cause of her encephalopathy is hypoxia Secondary to CHF exacerbation. Mental status back to baseline  Hyperkalemia: - Most likely due to Aldactone, ACE inhibitor and acute renal failure. - I will hold her Aldactone and ACE inhibitor continue her Lasix and recheck a basic metabolic panel in the morning. BMP tomorrow  AKI (acute kidney injury) - Baseline creatinine less than 1, there is most likely due to acute decompensated heart failure. Continue Lasix drip monitor renal function closely  Leukocytosis - She denies cough and fever, she has remained afebrile here in the hospital, I will not start empiric antibiotics at this time.   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 She has a h/o HOCM, bradycardia s/p upgrade to BiV-PPM 05/08/12, PMHx also  notable for HTN and tobacco abuse ECHO 09/03/13: EF 40-45% she comes in for confusion. She related that when she woke up this morning she was short of breath going on her car and when she got to her destination she got out of the car and got confused and more shortness of breath got worse she relates she doesn't remember EMS got there. They were unable to arouse her. She was fever cough shortness of breath sick contact chest pain or diarrhea. She relates that her Lasix dose was reduced about 2 months ago and she has been consuming more [place] usual.  In the ED: A chest x-ray was done that showed interstitial infiltrates, BMP was done that was elevated 2700, with new acute renal failure hyperkalemia and leukocytosis. An ABG was done that showed a pH of 7.3/41/65 consulted for further evaluation.  Consultants:  Cardiology  Procedures:      Antibiotics: None  HPI/Subjective: Patient improving, denies chest pain or shortness of breath  Objective: Filed Vitals:   04/10/14 2220 04/11/14 0109 04/11/14 0554 04/11/14 0900  BP: 101/60  107/72 100/63  Pulse: 64  69 70  Temp: 97.9 F (36.6 C)  97.3 F (36.3 C) 97.1 F (36.2 C)  TempSrc: Oral  Oral Oral  Resp: 18  20 20   Height:  5\' 2"  (1.575 m)    Weight:  58.06 kg (128 lb)    SpO2: 94%  97% 99%    Intake/Output Summary (Last 24 hours) at 04/11/14 1125 Last data filed at 04/11/14 1110  Gross per 24 hour  Intake    522 ml  Output   2550 ml  Net  -  2028 ml    Exam:  General: No acute respiratory distress Lungs: Clear to auscultation bilaterally without wheezes or crackles Cardiovascular: Regular rate and rhythm without murmur gallop or rub normal S1 and S2 Abdomen: Nontender, nondistended, soft, bowel sounds positive, no rebound, no ascites, no appreciable mass Extremities: No significant cyanosis, clubbing, or edema bilateral lower extremities      Data Reviewed: Basic Metabolic Panel:  Recent Labs Lab 04/10/14 1105  04/10/14 1717  NA 137  --   K 5.4*  --   CL 105  --   CO2 18*  --   GLUCOSE 341*  --   BUN 19  --   CREATININE 1.34* 1.17*  CALCIUM 8.6  --     Liver Function Tests: No results for input(s): AST, ALT, ALKPHOS, BILITOT, PROT, ALBUMIN in the last 168 hours. No results for input(s): LIPASE, AMYLASE in the last 168 hours.  Recent Labs Lab 04/10/14 1135  AMMONIA 34*    CBC:  Recent Labs Lab 04/10/14 1105 04/10/14 1717  WBC 14.2* 11.0*  NEUTROABS 12.0*  --   HGB 12.8 11.9*  HCT 39.1 36.6  MCV 100.5* 97.6  PLT 193 161    Cardiac Enzymes:  Recent Labs Lab 04/10/14 1105 04/10/14 1717 04/10/14 2135  TROPONINI 0.05* 0.09* 0.09*   BNP (last 3 results)  Recent Labs  04/10/14 1105  BNP 2728.1*    ProBNP (last 3 results) No results for input(s): PROBNP in the last 8760 hours.    CBG: No results for input(s): GLUCAP in the last 168 hours.  No results found for this or any previous visit (from the past 240 hour(s)).   Studies: Dg Chest Port 1 View  04/10/2014   CLINICAL DATA:  Shortness of breath, hypoxia, pacemaker  EXAM: PORTABLE CHEST - 1 VIEW  COMPARISON:  None.  FINDINGS: Left subclavian 3 lead pacemaker noted. Heart is enlarged with diffuse increased interstitial opacities compatible with mild interstitial edema. Small pleural effusion versus pleural thickening on the right. Right mid lung scarring versus atelectasis evident. No pneumothorax. Trachea midline.  IMPRESSION: Cardiomegaly with mild interstitial edema pattern  Diffuse right pleural thickening versus effusion  Right midlung atelectasis versus scarring   Electronically Signed   By: Jerilynn Mages.  Shick M.D.   On: 04/10/2014 12:48    Scheduled Meds: . aspirin EC  81 mg Oral Daily  . carvedilol  12.5 mg Oral BID  . escitalopram  10 mg Oral Daily  . heparin  5,000 Units Subcutaneous 3 times per day  . sodium chloride  3 mL Intravenous Q12H   Continuous Infusions: . furosemide (LASIX) infusion 8 mg/hr  (04/11/14 1110)    Active Problems:   Acute encephalopathy   Acute respiratory failure with hypoxia   Acute systolic heart failure   Hyperkalemia   AKI (acute kidney injury)   Leukocytosis    Time spent: 40 minutes   [facility] Pager [phone]. If 7PM-7AM, please contact night-coverage at [email or web address], password [facility] 04/11/2014, 11:25 AM  LOS: 1 day

## 2014-04-12 DIAGNOSIS — R0600 Dyspnea, unspecified: Secondary | ICD-10-CM

## 2014-04-12 DIAGNOSIS — Z950 Presence of cardiac pacemaker: Secondary | ICD-10-CM

## 2014-04-12 DIAGNOSIS — I420 Dilated cardiomyopathy: Secondary | ICD-10-CM

## 2014-04-12 DIAGNOSIS — R7989 Other specified abnormal findings of blood chemistry: Secondary | ICD-10-CM

## 2014-04-12 DIAGNOSIS — R778 Other specified abnormalities of plasma proteins: Secondary | ICD-10-CM

## 2014-04-12 DIAGNOSIS — E876 Hypokalemia: Secondary | ICD-10-CM

## 2014-04-12 DIAGNOSIS — I5023 Acute on chronic systolic (congestive) heart failure: Principal | ICD-10-CM

## 2014-04-12 LAB — COMPREHENSIVE METABOLIC PANEL WITH GFR
ALT: 22 U/L (ref 0–35)
AST: 20 U/L (ref 0–37)
Albumin: 3.6 g/dL (ref 3.5–5.2)
Alkaline Phosphatase: 60 U/L (ref 39–117)
Anion gap: 11 (ref 5–15)
BUN: 20 mg/dL (ref 6–23)
CO2: 28 mmol/L (ref 19–32)
Calcium: 8.6 mg/dL (ref 8.4–10.5)
Chloride: 99 mmol/L (ref 96–112)
Creatinine, Ser: 1.16 mg/dL — ABNORMAL HIGH (ref 0.50–1.10)
GFR calc Af Amer: 57 mL/min — ABNORMAL LOW
GFR calc non Af Amer: 49 mL/min — ABNORMAL LOW
Glucose, Bld: 98 mg/dL (ref 70–99)
Potassium: 3.3 mmol/L — ABNORMAL LOW (ref 3.5–5.1)
Sodium: 138 mmol/L (ref 135–145)
Total Bilirubin: 0.7 mg/dL (ref 0.3–1.2)
Total Protein: 6.7 g/dL (ref 6.0–8.3)

## 2014-04-12 MED ORDER — ZOLPIDEM TARTRATE 5 MG PO TABS
5.0000 mg | ORAL_TABLET | Freq: Every evening | ORAL | Status: DC | PRN
  Filled 2014-04-12: qty 1

## 2014-04-12 MED ORDER — POTASSIUM CHLORIDE CRYS ER 20 MEQ PO TBCR
20.0000 meq | EXTENDED_RELEASE_TABLET | Freq: Once | ORAL | Status: AC
  Administered 2014-04-12: 20 meq via ORAL
  Filled 2014-04-12: qty 1

## 2014-04-12 MED ORDER — FUROSEMIDE 80 MG PO TABS
80.0000 mg | ORAL_TABLET | Freq: Every day | ORAL | Status: DC
  Administered 2014-04-12 – 2014-04-13 (×2): 80 mg via ORAL
  Filled 2014-04-12 (×3): qty 1

## 2014-04-12 MED ORDER — CARVEDILOL 6.25 MG PO TABS
6.2500 mg | ORAL_TABLET | Freq: Two times a day (BID) | ORAL | Status: DC
  Administered 2014-04-12 – 2014-04-13 (×2): 6.25 mg via ORAL
  Filled 2014-04-12 (×4): qty 1

## 2014-04-12 NOTE — Progress Notes (Addendum)
[facility] PROGRESS NOTE  Angela Shepherd RWE:[id number] DOB: 1950/05/26 DOA: 04/10/2014 PCP: Annye Asa, MD  Assessment/Plan: Active Problems:   Acute encephalopathy   Acute respiratory failure with hypoxia   Acute on chronic systolic heart failure   Hyperkalemia   AKI (acute kidney injury)   Leukocytosis   Congestive cardiomyopathy   Status post biventricular pacemaker   Hx of migraine headaches   Shortness of breath   Elevated troponin   Hypokalemia    Please note that patient has 2 medical record numbers  Acute respiratory failure with hypoxia due to Acute systolic heart failure: - Saturations are now greater than 95% on 4>2 L of oxygen,improving  spironolactone, lisinopril on hold - will hold further doses of Lasix for now given low BP  - add hold parameters to Coreg but if BP does not start to come up may need to back off dose as well - She relates that her Lasix dose was reduced about 2 months ago By Dr. Jeffie Pollock.plans for reassessment of EF - echo pending Continue  Furosemide 80 mg daily until weight down 3 lb less than current weight, then 40 mg daily received call from cardiology to hold DC until BP more stable  Acute encephalopathy: - Most like a cause of her encephalopathy is hypoxia Secondary to CHF exacerbation. Mental status back to baseline  Hyperkalemia: - Most likely due to Aldactone, ACE inhibitor and acute renal failure. - I will hold her Aldactone and ACE inhibitor continue her Lasix and recheck a basic metabolic panel in the morning. BMP tomorrow  AKI (acute kidney injury) - Baseline creatinine less than 1, there is most likely due to acute decompensated heart failure.  improving , off lasix gtt   Leukocytosis - She denies cough and fever, she has remained afebrile here in the hospital, I will not start empiric antibiotics at this time.  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 She has a h/o HOCM, bradycardia s/p upgrade to BiV-PPM 05/08/12, PMHx also notable for HTN and tobacco abuse ECHO 09/03/13: EF 40-45% she comes in for confusion. She related that when she woke up this morning she was short of breath going on her car and when she got to her destination she got out of the car and got confused and more shortness of breath got worse she relates she doesn't remember EMS got there. They were unable to arouse her. She was fever cough shortness of breath sick contact chest pain or diarrhea. She relates that her Lasix dose was reduced about 2 months ago and she has been consuming more [place] usual.  In the ED: A chest x-ray was done that showed interstitial infiltrates, BMP was done that was elevated 2700, with new acute renal failure hyperkalemia and leukocytosis. An ABG was done that showed a pH of 7.3/41/65 consulted for further evaluation.  Consultants:  Cardiology  Procedures:      Antibiotics: None  HPI/Subjective: Soft BP,, denies chest pain or shortness of breath  Objective: Filed Vitals:   04/11/14 2048 04/12/14 0109 04/12/14 0555 04/12/14 1008  BP: 98/57 90/50 91/48  94/65  Pulse: 67 64 65 79  Temp: 97.7 F (36.5 C) 98 F (36.7 C) 98.2 F (36.8 C)   TempSrc: Oral Oral Oral   Resp: 20 18 18    Height:  Weight:   56.79 kg (125 lb 3.2 oz)   SpO2: 99% 97% 95%     Intake/Output Summary (Last 24 hours) at 04/12/14 1337 Last data filed at 04/12/14 1000  Gross per 24 hour  Intake   1080 ml  Output   2675 ml  Net  -1595 ml    Exam:  Head: Normocephalic, atraumatic, sclera non-icteric, no xanthomas, nares are without discharge. Neck: JVP not elevated. Lungs: Mildly decreased BS right base with R mid crackles, otherwise no wheezes or rhonchi. Breathing is unlabored. Heart: RRR S1 S2  without murmurs, rubs, or gallops.  Abdomen: Soft, non-tender, non-distended with normoactive bowel sounds. No rebound/guarding. Extremities: No clubbing or cyanosis. No edema. Distal pedal pulses are 2+ and equal bilaterally. Neuro: Alert and oriented X 3. Moves all extremities spontaneously. Psych: Responds to questions appropriately with a normal affect.     Data Reviewed: Basic Metabolic Panel:  Recent Labs Lab 04/10/14 1105 04/10/14 1717 04/12/14 0400  NA 137  --  138  K 5.4*  --  3.3*  CL 105  --  99  CO2 18*  --  28  GLUCOSE 341*  --  98  BUN 19  --  20  CREATININE 1.34* 1.17* 1.16*  CALCIUM 8.6  --  8.6    Liver Function Tests:  Recent Labs Lab 04/12/14 0400  AST 20  ALT 22  ALKPHOS 60  BILITOT 0.7  PROT 6.7  ALBUMIN 3.6   No results for input(s): LIPASE, AMYLASE in the last 168 hours.  Recent Labs Lab 04/10/14 1135 04/11/14 1206  AMMONIA 34* 29    CBC:  Recent Labs Lab 04/10/14 1105 04/10/14 1717  WBC 14.2* 11.0*  NEUTROABS 12.0*  --   HGB 12.8 11.9*  HCT 39.1 36.6  MCV 100.5* 97.6  PLT 193 161    Cardiac Enzymes:  Recent Labs Lab 04/10/14 1105 04/10/14 1717 04/10/14 2135 04/11/14 1206  TROPONINI 0.05* 0.09* 0.09* 0.07*   BNP (last 3 results)  Recent Labs  04/10/14 1105  BNP 2728.1*    ProBNP (last 3 results) No results for input(s): PROBNP in the last 8760 hours.    CBG: No results for input(s): GLUCAP in the last 168 hours.  No results found for this or any previous visit (from the past 240 hour(s)).   Studies: Dg Chest Port 1 View  04/10/2014   CLINICAL DATA:  Shortness of breath, hypoxia, pacemaker  EXAM: PORTABLE CHEST - 1 VIEW  COMPARISON:  None.  FINDINGS: Left subclavian 3 lead pacemaker noted. Heart is enlarged with diffuse increased interstitial opacities compatible with mild interstitial edema. Small pleural effusion versus pleural thickening on the right. Right mid lung scarring versus atelectasis  evident. No pneumothorax. Trachea midline.  IMPRESSION: Cardiomegaly with mild interstitial edema pattern  Diffuse right pleural thickening versus effusion  Right midlung atelectasis versus scarring   Electronically Signed   By: Jerilynn Mages.  Shick M.D.   On: 04/10/2014 12:48    Scheduled Meds: . aspirin EC  81 mg Oral Daily  . carvedilol  6.25 mg Oral BID WC  . escitalopram  10 mg Oral Daily  . furosemide  80 mg Oral Daily  . heparin  5,000 Units Subcutaneous 3 times per day  . nicotine  14 mg Transdermal Daily  . sodium chloride  3 mL Intravenous Q12H   Continuous Infusions:    Active Problems:   Acute encephalopathy   Acute respiratory failure with hypoxia   Acute on chronic systolic  heart failure   Hyperkalemia   AKI (acute kidney injury)   Leukocytosis   Congestive cardiomyopathy   Status post biventricular pacemaker   Hx of migraine headaches   Shortness of breath   Elevated troponin   Hypokalemia    Time spent: 40 minutes   [facility] Pager [phone]. If 7PM-7AM, please contact night-coverage at [email or web address], password [facility] 04/12/2014, 1:37 PM  LOS: 2 days

## 2014-04-12 NOTE — Care Management Note (Unsigned)
    Page 1 of 1   04/12/2014     3:10:05 PM CARE MANAGEMENT NOTE 04/12/2014  Patient:  Angela Shepherd, Angela Shepherd   Account Number:  [id number]  Date Initiated:  04/12/2014  Documentation initiated by:  Bellamy Rubey  Subjective/Objective Assessment:   Pt adm on 04/10/14 with acute resp failure with hypoxia r/t CHF.  PTA, pt resides at home alone and is independent.     Action/Plan:   Will follow for dc needs as pt progresses.   Anticipated DC Date:  04/13/2014   Anticipated DC Plan:  [place]  CM consult      Choice offered to / List presented to:             Status of service:  In process, will continue to follow Medicare Important Message given?   (If response is "NO", the following Medicare IM given date fields will be blank) Date Medicare IM given:   Medicare IM given by:   Date Additional Medicare IM given:   Additional Medicare IM given by:    Discharge Disposition:    Per UR Regulation:  Reviewed for med. necessity/level of care/duration of stay  If discussed at [place] of Stay Meetings, dates discussed:    Comments:

## 2014-04-12 NOTE — Progress Notes (Signed)
  Echocardiogram 2D Echocardiogram has been performed.  Ameri Cahoon 04/12/2014, 9:22 AM

## 2014-04-12 NOTE — Progress Notes (Signed)
Patient: Angela Shepherd / Admit Date: 04/10/2014 / Date of Encounter: 04/12/2014, 9:02 AM   Subjective: Feeling a little better than the other day but notes a dull headache and dry cough. Mild dyspnea today. No chest pain.   Objective: Telemetry: AV paced Physical Exam: Blood pressure 91/48, pulse 65, temperature 98.2 F (36.8 C), temperature source Oral, resp. rate 18, height 5\' 2"  (1.575 m), weight 125 lb 3.2 oz (56.79 kg), SpO2 95 %. General: Well developed thin WF in no acute distress Head: Normocephalic, atraumatic, sclera non-icteric, no xanthomas, nares are without discharge. Neck:  JVP not elevated. Lungs: Mildly decreased BS right base with R mid crackles, otherwise no wheezes or rhonchi. Breathing is unlabored. Heart: RRR S1 S2 without murmurs, rubs, or gallops.  Abdomen: Soft, non-tender, non-distended with normoactive bowel sounds. No rebound/guarding. Extremities: No clubbing or cyanosis. No edema. Distal pedal pulses are 2+ and equal bilaterally. Neuro: Alert and oriented X 3. Moves all extremities spontaneously. Psych:  Responds to questions appropriately with a normal affect.   Intake/Output Summary (Last 24 hours) at 04/12/14 0902 Last data filed at 04/12/14 0847  Gross per 24 hour  Intake 1096.67 ml  Output   3325 ml  Net -2228.33 ml    Inpatient Medications:  . aspirin EC  81 mg Oral Daily  . carvedilol  12.5 mg Oral BID  . escitalopram  10 mg Oral Daily  . furosemide  40 mg Intravenous BID  . heparin  5,000 Units Subcutaneous 3 times per day  . nicotine  14 mg Transdermal Daily  . sodium chloride  3 mL Intravenous Q12H   Infusions:    Labs:  Recent Labs  04/10/14 1105 04/10/14 1717 04/12/14 0400  NA 137  --  138  K 5.4*  --  3.3*  CL 105  --  99  CO2 18*  --  28  GLUCOSE 341*  --  98  BUN 19  --  20  CREATININE 1.34* 1.17* 1.16*  CALCIUM 8.6  --  8.6    Recent Labs  04/12/14 0400  AST 20  ALT 22  ALKPHOS 60  BILITOT 0.7  PROT 6.7    ALBUMIN 3.6    Recent Labs  04/10/14 1105 04/10/14 1717  WBC 14.2* 11.0*  NEUTROABS 12.0*  --   HGB 12.8 11.9*  HCT 39.1 36.6  MCV 100.5* 97.6  PLT 193 161    Recent Labs  04/10/14 1105 04/10/14 1717 04/10/14 2135 04/11/14 1206  TROPONINI 0.05* 0.09* 0.09* 0.07*   Invalid input(s): POCBNP No results for input(s): HGBA1C in the last 72 hours.   Radiology/Studies:  Dg Chest Port 1 View  04/10/2014   CLINICAL DATA:  Shortness of breath, hypoxia, pacemaker  EXAM: PORTABLE CHEST - 1 VIEW  COMPARISON:  None.  FINDINGS: Left subclavian 3 lead pacemaker noted. Heart is enlarged with diffuse increased interstitial opacities compatible with mild interstitial edema. Small pleural effusion versus pleural thickening on the right. Right mid lung scarring versus atelectasis evident. No pneumothorax. Trachea midline.  IMPRESSION: Cardiomegaly with mild interstitial edema pattern  Diffuse right pleural thickening versus effusion  Right midlung atelectasis versus scarring   Electronically Signed   By: Jerilynn Mages.  Shick M.D.   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 Recent dietary indiscretion/increased stress. Said to  have history of HCM with prior echo 2011 severe asymmetric septal hypertrophy but not seen on more recent echoes. Original MRN [id number].  1. Acute respiratory failure due to acute on chronic systolic CHF with history of NICM s/p BiV-PPM - 2D Echo 08/2013: EF 30% with +WMA, normal LV thickness, grade 1 DD, trivial AI - underwent VV optimization adjustment on device 02/2014 with plans for reassessment of EF - echo pending - improved oxygen saturation, now on RA - weight now 120lbs (recently 128-132 in the office) - await repeat echo - smoking cessation recommended  2. Acute encephalopathy,  felt due to hypoxia, improved - mentation back to baseline  3. Hypotension - spironolactone, lisinopril on hold - will hold further doses of Lasix for now - add hold parameters to Coreg but if BP does not start to come up may need to back off dose as well - afebrile, WBC improving so infection less likely etiology  4. Mildly elevated troponin - in setting of hypoxia, likely to represent demand ischemia - will discuss further workup with MD - normal cors in 2011, but echo in 08/2013 with new WMA  5.Hyperkalemia now with hypokalemia - give 63meq once and follow  6. Acute kidney injury, improving 7. Leukocytosis, improving  Signed, Dayna Dunn PA-C  I have seen and examined the patient along with Melina Copa PA-C.  I have reviewed the chart, notes and new data.  I agree with PA's note.  Key new complaints: had ham for Easter and took prednisone since she thought worsening dyspnea was due to allergies Key examination changes: no overt clinical hypervolemia Key new findings / data: echo shows evidence of elevated right and left heart pressure (dilated IVC, pseudonormal mitral inflow, but E/e' only marginally elevated)  PLAN: Suspect she is still about 2-4 lb above optimal volume status, based on echo findings. This means her "real" weight has decreased since last winter. Decompensation may have been related to increased sodium intake and steroids. Note reduced diuretic dose after very low BNP in February (now BNP way up). She feels ready to go home, but will need early follow up in CHF clinic and repeat labs (interaction of K supplements, furosemide and spironolactone unpredictable). Would DC home on Furosemide 80 mg daily until weight down 3 lb less than current weight, then 40 mg daily. Check K/BMET on Wednesday. Clinic f/u within the week.  Sanda Klein, MD, [place] [phone] 04/12/2014, 12:28 PM

## 2014-04-12 NOTE — Progress Notes (Signed)
BP is limiting use of HF meds. Would give diuretic now and reduce carvedilol to 1/2 of previous dose.

## 2014-04-13 ENCOUNTER — Encounter: Payer: Self-pay | Admitting: Internal Medicine

## 2014-04-13 ENCOUNTER — Other Ambulatory Visit: Payer: Self-pay

## 2014-04-13 LAB — BASIC METABOLIC PANEL
ANION GAP: 6 (ref 5–15)
BUN: 21 mg/dL (ref 6–23)
CALCIUM: 9.2 mg/dL (ref 8.4–10.5)
CO2: 28 mmol/L (ref 19–32)
Chloride: 100 mmol/L (ref 96–112)
Creatinine, Ser: 1.02 mg/dL (ref 0.50–1.10)
GFR calc Af Amer: 67 mL/min — ABNORMAL LOW (ref >90)
GFR, EST NON AFRICAN AMERICAN: 58 mL/min — AB (ref >90)
Glucose, Bld: 128 mg/dL — ABNORMAL HIGH (ref 70–99)
Potassium: 3.7 mmol/L (ref 3.5–5.1)
Sodium: 134 mmol/L — ABNORMAL LOW (ref 135–145)

## 2014-04-13 MED ORDER — ASPIRIN 81 MG PO TBEC: 81.0000 mg | DELAYED_RELEASE_TABLET | Freq: Every day | ORAL | Status: DC

## 2014-04-13 MED ORDER — FUROSEMIDE 80 MG PO TABS: 80.0000 mg | ORAL_TABLET | Freq: Every day | ORAL | Status: DC

## 2014-04-13 MED ORDER — NICOTINE 14 MG/24HR TD PT24: 14.0000 mg | MEDICATED_PATCH | Freq: Every day | TRANSDERMAL | Status: DC

## 2014-04-13 MED ORDER — LISINOPRIL 10 MG PO TABS
10.0000 mg | ORAL_TABLET | Freq: Every day | ORAL | Status: DC
Start: 2014-04-13 — End: 2014-04-13
  Administered 2014-04-13: 10 mg via ORAL
  Filled 2014-04-13: qty 1

## 2014-04-13 MED ORDER — CARVEDILOL 6.25 MG PO TABS: 6.2500 mg | ORAL_TABLET | Freq: Two times a day (BID) | ORAL | Status: DC

## 2014-04-13 NOTE — Discharge Summary (Signed)
Physician Discharge Summary  Angela Shepherd MRN: [id number] DOB/AGE: 63-30-1952 63 y.o.  PCP: Annye Asa, MD   Admit date: 04/10/2014 Discharge date: 04/13/2014  Discharge Diagnoses:   Active Problems:   Acute encephalopathy   Acute respiratory failure with hypoxia   Acute on chronic systolic heart failure   Hyperkalemia   AKI (acute kidney injury)   Leukocytosis   Congestive cardiomyopathy   Status post biventricular pacemaker   Hx of migraine headaches   Shortness of breath   Elevated troponin   Hypokalemia  Follow-up recommendations Follow-up BMP next week Follow-up with PCP in 5-7 days Follow-up with cardiology on 4/14     Medication List    STOP taking these medications        spironolactone 25 MG tablet  Commonly known as:  ALDACTONE      TAKE these medications        aspirin 81 MG EC tablet  Take 1 tablet (81 mg total) by mouth daily.     carvedilol 6.25 MG tablet  Commonly known as:  COREG  Take 1 tablet (6.25 mg total) by mouth 2 (two) times daily with a meal.     escitalopram 10 MG tablet  Commonly known as:  LEXAPRO  Take 10 mg by mouth daily.     furosemide 80 MG tablet  Commonly known as:  LASIX  Take 1 tablet (80 mg total) by mouth daily.     lisinopril 10 MG tablet  Commonly known as:  PRINIVIL,ZESTRIL  Take 10 mg by mouth daily.     nicotine 14 mg/24hr patch  Commonly known as:  NICODERM CQ - dosed in mg/24 hours  Place 1 patch (14 mg total) onto the skin daily.        Discharge Condition: *stable   Disposition: Final discharge disposition not confirmed   Consults:  Cardiology*   Significant Diagnostic Studies: Dg Chest Port 1 View  04/10/2014   CLINICAL DATA:  Shortness of breath, hypoxia, pacemaker  EXAM: PORTABLE CHEST - 1 VIEW  COMPARISON:  None.  FINDINGS: Left subclavian 3 lead pacemaker noted. Heart is enlarged with diffuse increased interstitial opacities compatible with mild interstitial edema. Small  pleural effusion versus pleural thickening on the right. Right mid lung scarring versus atelectasis evident. No pneumothorax. Trachea midline.  IMPRESSION: Cardiomegaly with mild interstitial edema pattern  Diffuse right pleural thickening versus effusion  Right midlung atelectasis versus scarring   Electronically Signed   By: Jerilynn Mages.  Shick M.D.   On: 04/10/2014 12:48    2-D echo EF of 45-50%  - Procedure narrative: Transthoracic echocardiography. Image quality was adequate. The study was technically difficult. - Left ventricle: The cavity size was normal. There was mild focal basal hypertrophy of the septum. Systolic function was mildly reduced. The estimated ejection fraction was in the range of 45% to 50%. Wall motion was normal; there were no regional wall motion abnormalities. Features are consistent with a pseudonormal left ventricular filling pattern, with concomitant abnormal relaxation and increased filling pressure (grade 2 diastolic dysfunction). Doppler parameters are consistent with elevated mean left atrial filling pressure. - Ventricular septum: Septal and apical dyssynergy. These changes are consistent with intraventricular conduction delay. Overall good resynchronization. - Aortic valve: There was trivial regurgitation. - Mitral valve: There was mild regurgitation. - Pericardium, extracardiac: A trivial pericardial effusion was identified posterior to the heart.    Microbiology: No results found for this or any previous visit (from the past 240 hour(s)).   Labs:  Results for orders placed or performed during the hospital encounter of 04/10/14 (from the past 48 hour(s))  Comprehensive metabolic panel     Status: Abnormal   Collection Time: 04/12/14  4:00 AM  Result Value Ref Range   Sodium 138 135 - 145 mmol/L   Potassium 3.3 (L) 3.5 - 5.1 mmol/L    Comment: DELTA CHECK NOTED   Chloride 99 96 - 112 mmol/L   CO2 28 19 - 32 mmol/L   Glucose,  Bld 98 70 - 99 mg/dL   BUN 20 6 - 23 mg/dL   Creatinine, Ser 1.16 (H) 0.50 - 1.10 mg/dL   Calcium 8.6 8.4 - 10.5 mg/dL   Total Protein 6.7 6.0 - 8.3 g/dL   Albumin 3.6 3.5 - 5.2 g/dL   AST 20 0 - 37 U/L   ALT 22 0 - 35 U/L   Alkaline Phosphatase 60 39 - 117 U/L   Total Bilirubin 0.7 0.3 - 1.2 mg/dL   GFR calc non Af Amer 49 (L) >90 mL/min   GFR calc Af Amer 57 (L) >90 mL/min    Comment: (NOTE) The eGFR has been calculated using the CKD EPI equation. This calculation has not been validated in all clinical situations. eGFR's persistently <90 mL/min signify possible Chronic Kidney Disease.    Anion gap 11 5 - 15  Basic metabolic panel     Status: Abnormal   Collection Time: 04/13/14  4:39 AM  Result Value Ref Range   Sodium 134 (L) 135 - 145 mmol/L   Potassium 3.7 3.5 - 5.1 mmol/L   Chloride 100 96 - 112 mmol/L   CO2 28 19 - 32 mmol/L   Glucose, Bld 128 (H) 70 - 99 mg/dL   BUN 21 6 - 23 mg/dL   Creatinine, Ser 1.02 0.50 - 1.10 mg/dL   Calcium 9.2 8.4 - 10.5 mg/dL   GFR calc non Af Amer 58 (L) >90 mL/min   GFR calc Af Amer 67 (L) >90 mL/min    Comment: (NOTE) The eGFR has been calculated using the CKD EPI equation. This calculation has not been validated in all clinical situations. eGFR's persistently <90 mL/min signify possible Chronic Kidney Disease.    Anion gap 6 5 - 15     HPI :64 y.o.female. Yesterday she was short of breath. Then while ambulating she had decreased consciousness. She was admitted to the hospital. There were signs of volume overload. It was felt that her mental status change may have been related to hypoxia. She has a definite cardiac history and cardiology is consult for further help with her cardiac care.  The patient has a history of a cardiomyopathy with decreased left ventricular function. In some places the term HOCM is used. However I do not see that she has ever had an obstructive cardiomyopathy. I have listed this is a congested cardiomyopathy  for today. She has a history of shortness of breath. 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 She had a pacemaker in the past. She was then upgraded to a biventricular pacer. She's followed carefully in our office by Dr. Rayann Heman. On her last echo of August, 2015 her ejection fraction was 30%. She saw Dr. Rayann Heman in February, 2016 who made adjustments to her biventricular pacer. She was then seen by Amber in the office on March, 2016. It was noted at that time that her shortness of breath was somewhat improved. It was felt that the changes in her pacemaker might be helping.  Plans were being made to do a follow-up 2-D echo to see how her left ventricle was performing with the new settings.  The patient admits that she has not been taking care of herself well recently relative to her salt and fluid intake. She has several social stresses. Her husband passed away last year. She works at [employer]. In addition she has been helping to take care for 14 year old parent. She ate excess ham around the time of Easter and has continued to have ham sandwiches during this week. She knows that her weight has gone up, but she has not weighed herself. She does not develop edema usually. Her sign of volume overload is abdominal distention.   HOSPITAL COURSE:   Please note that patient has 2 medical record numbers  Acute respiratory failure with hypoxia due to Acute systolic heart failure: - Saturations are now greater than 95% on 4>2 L of oxygen,improving  spironolactone, lisinopril on hold Resolved acute exacerbation of chronic LV systolic failure No obvious change in LV function and normal CRT device operation Suspect decompensation was due to increased sodium intake and use of prednisone Also suspect that she has lost real weight and her "dry weight" needs recalibration I think she is closer to euvolemia today. K in normal range, creat a touch improved. Restart ACEi at previous dose. DC on lower  carvedilol dose (6.26 mg BID) with plan to titrate back up at F/U in CHF clinic /14. Continue Lasix (80 mg daily) until F/u. BMET within the week.  Acute encephalopathy: - Most like a cause of her encephalopathy is hypoxia Secondary to CHF exacerbation. Mental status back to baseline  Hyperkalemia: - Most likely due to Aldactone, ACE inhibitor and acute renal failure. Hold Aldactone, resume ACE inhibitor BMP next week   AKI (acute kidney injury) Creatinine 1.34 upon admission down to 1.02 with diuresis BMP next week  Leukocytosis - She denies cough and fever, she has remained afebrile here in the hospital, I will not start empiric antibiotics at this time. Chest x-ray 4/2 shows interstitial edema, no pneumonia Patient is a smoker and has been started on a nicotine patch  Hypokalemia  Replete     Discharge Exam: Blood pressure 107/60, pulse 61, temperature 98.5 F (36.9 C), temperature source Oral, resp. rate 20, height '5\' 2"'  (1.575 m), weight 56.926 kg (125 lb 8 oz), SpO2 96 %.  Head: Normocephalic, atraumatic, sclera non-icteric, no xanthomas, nares are without discharge. Neck: JVP not elevated. Lungs: Mildly decreased BS right base with R mid crackles, otherwise no wheezes or rhonchi. Breathing is unlabored. Heart: RRR S1 S2 without murmurs, rubs, or gallops.  Abdomen: Soft, non-tender, non-distended with normoactive bowel sounds. No rebound/guarding. Extremities: No clubbing or cyanosis. No edema. Distal pedal pulses are 2+ and equal bilaterally. Neuro: Alert and oriented X 3. Moves all extremities spontaneously. Psych: Responds to questions appropriately with a normal affect.       Discharge Instructions    AMB Referral to [facility]    Complete by:  As directed   Reason for consult:  appointment for Link to Wellness to be scheduled  Expected date of contact:  1-3 days (reserved for hospital discharges)     Diet - low sodium heart healthy    Complete  by:  As directed      Increase activity slowly    Complete by:  As directed            Follow-up Information    Follow  up with Loralie Champagne, MD On 04/22/2014.   Specialty:  Cardiology   Why:  at 3:40pm in the [facility]--Gate code 0007--please bring all medications to appt   Contact information:   [address]  [place] [address] [phone]       Follow up with Annye Asa, MD. Schedule an appointment as soon as possible for a visit in 3 days.   Specialty:  Family Medicine   Contact information:   [place] [address] [phone]       Signed: Reyne Dumas 04/13/2014, 12:48 PM

## 2014-04-13 NOTE — Progress Notes (Signed)
Nutrition Education Note  RD consulted for nutrition education regarding low sodium diet for CHF. Pt states that when she was first diagnosed with HF, she received good diet education for a low sodium diet. She followed the diet well and lost 30 lbs. She states that she has gotten off track due to her husband passing away this past year and her mother, being 63 years old, requiring more assistance in her care recently.   RD provided "Heart Healthy Eating Nutrition Therapy" handout from the Academy of [employer] and [employer]etics. Discouraged intake of processed foods and use of salt shaker. Encouraged fresh fruits and vegetables as well as whole grain sources of carbohydrates to maximize fiber intake. Discussed healthy low sodium snack ideas and provided tips for reading nutrition labels.   RD discussed why it is important for patient to adhere to diet recommendations, and emphasized the foods to avoid. Teach back method used.  Expect good compliance.  Body mass index is 22.95 kg/(m^2). Pt meets criteria for Normal Weight based on current BMI.  Current diet order is 2 Gram Sodium, pt is eating approximately 75-100% of meals. She was preparing for discharge at time of visit. Labs and medications reviewed. No further nutrition interventions warranted at this time. RD contact information provided. If additional nutrition issues arise, please re-consult RD.   Angela Shepherd RD, LDN Inpatient Clinical Dietitian Pager: [phone] After Hours Pager: [phone]

## 2014-04-13 NOTE — Consult Note (Signed)
   [facility] CM Inpatient Consult   04/13/2014  Gavrielle Streck 09/04/1950 [id number] Patient evaluated for community based chronic disease management services with [facility] Link to Wellness Program as a benefit of [employer]. Spoke with patient at bedside to explain [facility] services.  Patient will be evaluated for office visits for assessments and disease management and education.  Left contact information and [facility] literature at bedside. Made Inpatient Case Manager aware that [facility] following. Of note, [facility] Care Management services does not replace or interfere with any services that are arranged by inpatient case management or social work.  For additional questions or referrals please contact:   Natividad Brood, RN BSN [facility] Liaison  [phone] business mobile phone

## 2014-04-13 NOTE — Progress Notes (Signed)
Patient Name: Angela Shepherd Date of Encounter: 04/13/2014  Active Problems:   Acute encephalopathy   Acute respiratory failure with hypoxia   Acute on chronic systolic heart failure   Hyperkalemia   AKI (acute kidney injury)   Leukocytosis   Congestive cardiomyopathy   Status post biventricular pacemaker   Hx of migraine headaches   Shortness of breath   Elevated troponin   Hypokalemia   Length of Stay: 3  SUBJECTIVE  Feels well, "peed a lot; the furosemide pills worked better than the shot" Approximately 1.2 L negative balance from yesterday, but weight reported as unchanged. SBP in 90-100 range, which she reports as her usual. No dizziness or other symptoms of hypotension.  CURRENT MEDS . aspirin EC  81 mg Oral Daily  . carvedilol  6.25 mg Oral BID WC  . escitalopram  10 mg Oral Daily  . furosemide  80 mg Oral Daily  . heparin  5,000 Units Subcutaneous 3 times per day  . lisinopril  10 mg Oral Daily  . nicotine  14 mg Transdermal Daily  . sodium chloride  3 mL Intravenous Q12H    OBJECTIVE   Intake/Output Summary (Last 24 hours) at 04/13/14 0921 Last data filed at 04/13/14 0600  Gross per 24 hour  Intake    810 ml  Output   1726 ml  Net   -916 ml   Filed Weights   04/11/14 0109 04/12/14 0555 04/13/14 0543  Weight: 128 lb (58.06 kg) 125 lb 3.2 oz (56.79 kg) 125 lb 8 oz (56.926 kg)    PHYSICAL EXAM Filed Vitals:   04/12/14 1538 04/12/14 2030 04/13/14 0118 04/13/14 0543  BP: 106/67 102/63 98/63 96/63   Pulse: 66 61 64 61  Temp: 98.4 F (36.9 C) 97.6 F (36.4 C) 98.2 F (36.8 C) 98.5 F (36.9 C)  TempSrc: Oral Oral Oral Oral  Resp: 16 20 18 20   Height:      Weight:    125 lb 8 oz (56.926 kg)  SpO2: 96% 93% 95% 96%   General: Alert, oriented x3, no distress Head: no evidence of trauma, PERRL, EOMI, no exophtalmos or lid lag, no myxedema, no xanthelasma; normal ears, nose and oropharynx Neck: normal jugular venous pulsations and no hepatojugular  reflux; brisk carotid pulses without delay and no carotid bruits Chest: clear to auscultation, no signs of consolidation by percussion or palpation, normal fremitus, symmetrical and full respiratory excursions Cardiovascular: normal position and quality of the apical impulse, regular rhythm, normal first and second heart sounds, no rubs or gallops, no murmur Abdomen: no tenderness or distention, no masses by palpation, no abnormal pulsatility or arterial bruits, normal bowel sounds, no hepatosplenomegaly Extremities: no clubbing, cyanosis or edema; 2+ radial, ulnar and brachial pulses bilaterally; 2+ right femoral, posterior tibial and dorsalis pedis pulses; 2+ left femoral, posterior tibial and dorsalis pedis pulses; no subclavian or femoral bruits Neurological: grossly nonfocal  LABS  CBC  Recent Labs  04/10/14 1105 04/10/14 1717  WBC 14.2* 11.0*  NEUTROABS 12.0*  --   HGB 12.8 11.9*  HCT 39.1 36.6  MCV 100.5* 97.6  PLT 193 176   Basic Metabolic Panel  Recent Labs  04/12/14 0400 04/13/14 0439  NA 138 134*  K 3.3* 3.7  CL 99 100  CO2 28 28  GLUCOSE 98 128*  BUN 20 21  CREATININE 1.16* 1.02  CALCIUM 8.6 9.2   Liver Function Tests  Recent Labs  04/12/14 0400  AST 20  ALT 22  ALKPHOS  60  BILITOT 0.7  PROT 6.7  ALBUMIN 3.6   No results for input(s): LIPASE, AMYLASE in the last 72 hours. Cardiac Enzymes  Recent Labs  04/10/14 1717 04/10/14 2135 04/11/14 1206  TROPONINI 0.09* 0.09* 0.07*   Radiology Studies Imaging results have been reviewed and No results found.  TELE A sensed BiV paced 100%   ASSESSMENT AND PLAN  Resolved acute exacerbation of chronic LV systolic failure No obvious change in LV function and normal CRT device operation Suspect decompensation was due to increased sodium intake and use of prednisone Also suspect that she has lost real weight and her "dry weight" needs recalibration I think she is closer to euvolemia today. K in normal  range, creat a touch improved. Restart ACEi at previous dose. DC on lower carvedilol dose (6.26 mg BID) with plan to titrate back up at F/U in CHF clinic. Keep on higher furosemide dose for now as well (80 mg daily) until F/u. BMET within the week.   Sanda Klein, MD, [facility] [phone] office [phone] pager 04/13/2014 9:21 AM

## 2014-04-13 NOTE — Discharge Instructions (Signed)
Patient admitted 4/2-4/5  May go back to work  04/19/14

## 2014-04-14 ENCOUNTER — Ambulatory Visit (HOSPITAL_COMMUNITY): Payer: 59 | Attending: Cardiology

## 2014-04-14 ENCOUNTER — Telehealth: Payer: Self-pay | Admitting: *Deleted

## 2014-04-14 ENCOUNTER — Encounter: Payer: 59 | Admitting: Nurse Practitioner

## 2014-04-14 NOTE — Addendum Note (Signed)
Addended by: Leticia Penna A on: 04/14/2014 05:16 PM   Modules accepted: Medications

## 2014-04-14 NOTE — Telephone Encounter (Addendum)
Transition Care Management Follow-up Telephone Call  How have you been since you were released from the hospital? "bones are very sore from sternal rub"   Do you understand why you were in the hospital? YES, took prednisone and was eating ham, "had so much fluid in there that my heart couldn't pump"     Do you understand the discharge instrcutions?  YES  Items Reviewed:  Medications reviewed: YES  Allergies reviewed: YES   Dietary changes reviewed: YES - low sodium, patient states she threw all the salt products out of her fridge   Referrals reviewed: YES    Functional Questionnaire:   Activities of Daily Living (ADLs):   She states they are independent in the following: ambulation, restroom, cooking, cleaning, transportation States they require assistance with the following: none   Any transportation issues/concerns?: NO   Any patient concerns? Soreness (in sternum)   Confirmed importance and date/time of follow-up visits scheduled: YES (scheduled with Dr. Birdie Riddle 04/22/14)   Confirmed with patient if condition begins to worsen call PCP or go to the ER.  Patient was given the Call-a-Nurse line [phone]: YES

## 2014-04-14 NOTE — Telephone Encounter (Signed)
Unable to reach patient at time of TCM Call. Left message for patient to return call when available.  

## 2014-04-14 NOTE — Progress Notes (Signed)
This encounter was created in error - please disregard.

## 2014-04-16 ENCOUNTER — Encounter: Payer: Self-pay | Admitting: Nurse Practitioner

## 2014-04-20 ENCOUNTER — Encounter (HOSPITAL_COMMUNITY): Payer: Self-pay | Admitting: Family Medicine

## 2014-04-22 ENCOUNTER — Ambulatory Visit (HOSPITAL_COMMUNITY)
Admit: 2014-04-22 | Discharge: 2014-04-22 | Disposition: A | Discharge status: Home / Self Care | Payer: 59 | Source: Ambulatory Visit | Attending: Internal Medicine | Admitting: Internal Medicine

## 2014-04-22 ENCOUNTER — Ambulatory Visit: Payer: 59 | Admitting: Family Medicine

## 2014-04-22 ENCOUNTER — Encounter (HOSPITAL_COMMUNITY): Payer: Self-pay

## 2014-04-22 VITALS — BP 102/58 | HR 70 | Wt 130.5 lb

## 2014-04-22 DIAGNOSIS — I5022 Chronic systolic (congestive) heart failure: Secondary | ICD-10-CM | POA: Insufficient documentation

## 2014-04-22 DIAGNOSIS — I10 Essential (primary) hypertension: Secondary | ICD-10-CM | POA: Insufficient documentation

## 2014-04-22 DIAGNOSIS — I5023 Acute on chronic systolic (congestive) heart failure: Secondary | ICD-10-CM

## 2014-04-22 DIAGNOSIS — Z87891 Personal history of nicotine dependence: Secondary | ICD-10-CM | POA: Diagnosis not present

## 2014-04-22 DIAGNOSIS — I5032 Chronic diastolic (congestive) heart failure: Secondary | ICD-10-CM

## 2014-04-22 LAB — BASIC METABOLIC PANEL
Anion gap: 10 (ref 5–15)
BUN: 28 mg/dL — ABNORMAL HIGH (ref 6–23)
CO2: 27 mmol/L (ref 19–32)
Calcium: 9.2 mg/dL (ref 8.4–10.5)
Chloride: 101 mmol/L (ref 96–112)
Creatinine, Ser: 1.26 mg/dL — ABNORMAL HIGH (ref 0.50–1.10)
GFR, EST AFRICAN AMERICAN: 52 mL/min — AB (ref >90)
GFR, EST NON AFRICAN AMERICAN: 45 mL/min — AB (ref >90)
Glucose, Bld: 93 mg/dL (ref 70–99)
POTASSIUM: 4.4 mmol/L (ref 3.5–5.1)
Sodium: 138 mmol/L (ref 135–145)

## 2014-04-22 LAB — BRAIN NATRIURETIC PEPTIDE: B Natriuretic Peptide: 314.4 pg/mL — ABNORMAL HIGH (ref 0.0–100.0)

## 2014-04-22 MED ORDER — FUROSEMIDE 80 MG PO TABS: 40.0000 mg | ORAL_TABLET | Freq: Two times a day (BID) | ORAL | Status: DC

## 2014-04-22 MED ORDER — SPIRONOLACTONE 25 MG PO TABS: ORAL_TABLET | ORAL | Status: DC

## 2014-04-22 MED ORDER — SPIRONOLACTONE 25 MG PO TABS: 25.0000 mg | ORAL_TABLET | Freq: Every day | ORAL | Status: DC

## 2014-04-22 NOTE — Progress Notes (Signed)
Patient ID: Angela Shepherd, female   DOB: [birth year], 64 y.o.   MRN: [id number] PCP: Dr. 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 PMHx also notable for HTN and prior tobacco abuse (quit 02/2010).   Was admitted in December 2011 with acute HF. Echo with EF 30-35%, There was also evidence of ASH with septum of 1.7 PW = 1.9. Diuresed and scheduled for outpt cath.  Cath 12/2009: Normal coronaries. EF 40-45%.  Central aortic pressure was 86/47 with a mean of 62.  LV pressure is 74/13 with an EDP of 2.  Right atrial pressure mean of 3.  RV pressure 26/3 with an EDP of 5.  PA pressure 28/5 with a mean of 16.  Pulmonary capillary wedge pressure mean of 7.  Fick cardiac output was 3.4 and cardiac index 2.1.   Was seen by EP and noted to have a high percentage of RV pacing (99%) and it was felt this was worsening LV function. Underwent upgrade to [facility]. Jude CRT-P in 5/14.  Echo in 8/14 showed EF 45-50% with mild LVH.   She returns for follow up. Admitted earlier this month with AMS and volume overload from prednsinone and high salt diet. Overall feels great. Weight at home 125-132 pounds. Taking all medications.  Does not eat any salt.  Denies SOB/PND/Orthopnea.  Walks 30 minutes a day. Not smoking using nicotine patch. Continues to work full time.   Echo in March 2012:  EF 50-55%. Grade 2 diastolic dysfunction. IVS 1.4cm.  Echo 4/14: EF 40-45%  ECHO 09/03/13: EF 40-45% ECHO 04/12/2014: EF 45-50%   Labs (5/14): K 4, creatinine 0.88 Labs 01/13/13 K 4.3 Creatinine 0.81  Labs 05/29/13 K 4.4 Creatinine 1.0 12/12/2014: K 4.5 Creatinine 1.23   SH: Nonsmoker, works at [employer], lives in [place].   ROS: All systems negative except as listed in HPI, PMH and Problem List.  Past Medical History  Diagnosis Date  . Hypertrophic obstructive cardiomyopathy   . Hypokalemia   . Chronic  systolic CHF (congestive heart failure)     a. EF 30-35% dx in 2011 -> most recent 40-45% by echo 04/2012. b. s/p upgrade to BiV-PPM 05/08/12. c. Med titration limited by hypotension.  . Bradycardia     a. initial PPM placed 1994 at [facility] with gen change 2002. b. 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 Jude BiV PPM 05/08/12.  . Pneumonia 2008  . Cluster headache syndrome   . Hypotension   . Pacemaker     Current Outpatient Prescriptions  Medication Sig Dispense Refill  . aspirin EC 81 MG EC tablet Take 1 tablet (81 mg total) by mouth daily. 30 tablet 1  . carvedilol (COREG) 6.25 MG tablet Take 1 tablet (6.25 mg total) by mouth 2 (two) times daily with a meal. 60 tablet 1  . escitalopram (LEXAPRO) 10 MG tablet Take 1 tablet (10 mg total) by mouth daily. 30 tablet 6  . furosemide (LASIX) 40 MG tablet Take 63 mg by mouth daily. Take 1 extra tablet by mouth as needed for weight gain of 3lbs or more    . lisinopril (PRINIVIL,ZESTRIL) 10 MG tablet Take 10 mg by mouth daily.    . diclofenac sodium (VOLTAREN) 1 % GEL Apply 4 g topically 4 (four) times daily. (Patient not taking: Reported on 04/14/2014) 100 g  3  . nicotine (NICODERM CQ - DOSED IN MG/24 HOURS) 14 mg/24hr patch Place 1 patch (14 mg total) onto the skin daily. (Patient not taking: Reported on 04/22/2014) 28 patch 0   No current facility-administered medications for this encounter.     PHYSICAL EXAM: Filed Vitals:   04/22/14 1535  BP: 102/58  Pulse: 70  Weight: 130 lb 8 oz (59.194 kg)  SpO2: 97%    General: NAD  HEENT: normal Neck: supple. JVP flat. Carotids 2+ bilaterally; no bruits. No lymphadenopathy or thryomegaly appreciated. Cor: PMI normal. Regular rate & rhythm. No rubs, gallops or murmurs. PPM site looks good. L upper chest CRT-D  Lungs:  Abdomen: soft, nontender, nondistended. No hepatosplenomegaly. No bruits or masses. Good bowel sounds. Extremities: no cyanosis, clubbing, rash, edema Neuro: alert  & orientedx3, cranial nerves grossly intact. Moves all 4 extremities w/o difficulty. Affect pleasant.   ASSESSMENT & PLAN: 1. Chronic systolic CHF: EF 63-14% ECHO 04/2014. S/P  CRT-P upgrade 05/2012 (due to chronic RV pacing)  NYHA class II symptoms.  Volume status stable. Continue lasix 40 mg twice a day.  - Continue Coreg 6.25 mg twice a day -Continue lisinopril 10 mg daily - Add back 12.5 mg spiro daily Check BMET BNP in 10 days  -2. H/o HCM  3. HTN- stable. Cut back lasix.  4. Former smoker- Quit 2012  Follow up in 2 months    CLEGG,AMY NP-C  04/22/2014

## 2014-04-22 NOTE — Patient Instructions (Addendum)
START Spironolactone 12.5 mg (1/2 tablet) daily.  START Lasix 40 mg a.m (1/2 tablet) 40 mg p.m (1/2 tablet)  LABS today (bmet & bnp)  FOLLOW UP in 2 months

## 2014-04-23 ENCOUNTER — Other Ambulatory Visit: Payer: Self-pay

## 2014-04-27 ENCOUNTER — Encounter: Payer: Self-pay | Admitting: Family Medicine

## 2014-04-27 ENCOUNTER — Ambulatory Visit (HOSPITAL_BASED_OUTPATIENT_CLINIC_OR_DEPARTMENT_OTHER)
Admission: RE | Admit: 2014-04-27 | Discharge: 2014-04-27 | Disposition: A | Discharge status: Home / Self Care | Payer: 59 | Source: Ambulatory Visit | Attending: Family Medicine | Admitting: Family Medicine

## 2014-04-27 ENCOUNTER — Ambulatory Visit (INDEPENDENT_AMBULATORY_CARE_PROVIDER_SITE_OTHER): Payer: 59 | Admitting: Family Medicine

## 2014-04-27 VITALS — BP 104/64 | HR 69 | Temp 98.1°F | Resp 16 | Wt 130.1 lb

## 2014-04-27 DIAGNOSIS — I808 Phlebitis and thrombophlebitis of other sites: Secondary | ICD-10-CM

## 2014-04-27 DIAGNOSIS — M7989 Other specified soft tissue disorders: Secondary | ICD-10-CM | POA: Insufficient documentation

## 2014-04-27 DIAGNOSIS — M79632 Pain in left forearm: Secondary | ICD-10-CM | POA: Diagnosis not present

## 2014-04-27 DIAGNOSIS — I82612 Acute embolism and thrombosis of superficial veins of left upper extremity: Secondary | ICD-10-CM | POA: Diagnosis not present

## 2014-04-27 DIAGNOSIS — I5023 Acute on chronic systolic (congestive) heart failure: Secondary | ICD-10-CM | POA: Diagnosis not present

## 2014-04-27 NOTE — Progress Notes (Signed)
   Subjective:    Patient ID: Angela Shepherd, female    DOB: [birth year], 64 y.o.   MRN: [id number]  [facility] f/u- pt was admitted on 4/2-4/5 due to acute CHF exacerbation and hypoxic encephalopathy.  BNP decreased from 2727 --> 314 at time of Cards f/u 2 days ago.  Cardiology restarted Spironolactone at 12.5mg  daily.  Now on ASA.  Pt continues to take 40mg  Lasix BID.  No CP, SOB, confusion, edema.  Now w/ L arm swelling and pain due to IV insertion.  Pt w/ palpable cord of L forearm.   Review of Systems For ROS see HPI      Objective:   Physical Exam  Constitutional: She is oriented to person, place, and time. She appears well-developed and well-nourished. No distress.  HENT:  Head: Normocephalic and atraumatic.  Neck: Normal range of motion. Neck supple. No thyromegaly present.  Cardiovascular: Normal rate, regular rhythm, normal heart sounds and intact distal pulses.   Pulmonary/Chest: Effort normal and breath sounds normal. No respiratory distress. She has no wheezes. She has no rales.  Musculoskeletal: She exhibits no edema.  + palpable cord w/ TTP over L forearm  Lymphadenopathy:    She has no cervical adenopathy.  Neurological: She is alert and oriented to person, place, and time.  Skin: Skin is warm and dry.  Psychiatric: She has a normal mood and affect. Her behavior is normal. Thought content normal.  Vitals reviewed.         Assessment & Plan:

## 2014-04-27 NOTE — Progress Notes (Signed)
Pre visit review using our clinic review tool, if applicable. No additional management support is needed unless otherwise documented below in the visit note. 

## 2014-04-27 NOTE — Assessment & Plan Note (Signed)
New.  Pt has painful, palpable cord of L forearm.  Suspect DVT.  Will get doppler to assess.

## 2014-04-27 NOTE — Assessment & Plan Note (Signed)
Pt is asymptomatic today.  Has already had cards f/u.  Has restarted spironolactone and had f/u labs.  No changes at this time.  Will follow.

## 2014-04-27 NOTE — Patient Instructions (Signed)
Go downstairs and get your doppler done at 12:30 I'll let you know the results and determine the next steps Call with any questions or concerns Hang in there!

## 2014-05-07 ENCOUNTER — Other Ambulatory Visit: Payer: Self-pay

## 2014-05-12 ENCOUNTER — Telehealth: Payer: Self-pay | Admitting: Family Medicine

## 2014-05-12 NOTE — Telephone Encounter (Signed)
Relation to pt: self  Call back number: [phone]   Reason for call:  Pt states she is scared to take predniSONE, pt states the last time she took medication she wine up in the hospital. Pt has reoccuring head aches. Pt has an appointment for 05/19/14. Please advise

## 2014-05-13 NOTE — Telephone Encounter (Signed)
I don't have record of pt being on Prednisone recently or being given a script.  I need more information to understand her concern

## 2014-05-13 NOTE — Telephone Encounter (Signed)
Called pt and LMOVM to return call.  °

## 2014-05-14 MED ORDER — SUMATRIPTAN SUCCINATE 50 MG PO TABS: ORAL_TABLET | ORAL | Status: DC

## 2014-05-14 NOTE — Telephone Encounter (Signed)
Prior to using prednisone (which can cause fluid retention- especially in someone w/ CHF), I would recommend she try a migraine abortive like Imitrex 50mg .  If no improvement w/ Imitrex, we can refer to neuro.  We can discuss this at time of OV.

## 2014-05-14 NOTE — Telephone Encounter (Signed)
Pt returning your call please call pt at work best # [phone]

## 2014-05-14 NOTE — Telephone Encounter (Signed)
Pt notified to keep appt on 05-19-14. Imitrex 50mg  was sent to local pharmacy per Provider. Pt advised to not take the prednisone due to her CHF. Pt stated an understanding.

## 2014-05-14 NOTE — Telephone Encounter (Signed)
Spoke with pt. She states that she had been given the prednisone in October as a migraine preventative? Pt states that she is beginning to have migraines again and before she jumps into the prednisone she wants to make sure it is what you would prefer. The prescription that the pt is talking about was given for musculoskeletal pain not for migraines. Please advise.   Also would you like pt to keep appt for next week?

## 2014-05-19 ENCOUNTER — Ambulatory Visit (INDEPENDENT_AMBULATORY_CARE_PROVIDER_SITE_OTHER): Payer: 59 | Admitting: Family Medicine

## 2014-05-19 ENCOUNTER — Encounter: Payer: Self-pay | Admitting: Family Medicine

## 2014-05-19 VITALS — BP 110/70 | HR 76 | Temp 98.0°F | Resp 16 | Wt 127.4 lb

## 2014-05-19 DIAGNOSIS — G44009 Cluster headache syndrome, unspecified, not intractable: Secondary | ICD-10-CM

## 2014-05-19 MED ORDER — SUMATRIPTAN SUCCINATE 50 MG PO TABS
ORAL_TABLET | ORAL | Status: DC
Start: 2014-05-19 — End: 2014-06-22

## 2014-05-19 MED ORDER — AMITRIPTYLINE HCL 10 MG PO TABS: 10.0000 mg | ORAL_TABLET | Freq: Every day | ORAL | Status: DC

## 2014-05-19 NOTE — Patient Instructions (Signed)
Follow up as needed If the headaches continue, please let me know so we can send you to Neuro Continue the Imitrex as needed for the headaches Drink plenty of fluids Start Zyrtec (or store brand equivalent) for the allergy component Rest! Call with any questions or concerns Hang in there!!!

## 2014-05-19 NOTE — Progress Notes (Signed)
Pre visit review using our clinic review tool, if applicable. No additional management support is needed unless otherwise documented below in the visit note. 

## 2014-05-19 NOTE — Assessment & Plan Note (Signed)
Recurrent for pt.  Has been asymptomatic for quite some time but has had 16 nights of HA.  Pt reports improvement w/ imitrex.  Our goal is to avoid prednisone in her as this can cause fluid retention and exacerbate CHF.  Strongly encouraged pt to have neuro evaluation but she declined.  Neuro exam is WNL today in office and pt is asymptomatic.  Pt to call if sxs don't improve.  Will follow.

## 2014-05-19 NOTE — Progress Notes (Signed)
   Subjective:    Patient ID: Angela Shepherd, female    DOB: 01-28-1950, 64 y.o.   MRN: [id number]  HPI HAs- recurrent issue for pt, pt reports she has no HAs during the day but has had a nightly HA for last 16 days.  Imitrex is 'working'- HAs are less intense and shorter in duration.  Pt has had similar HA episodes and at that time had 30 HAs in a row- each occuring nightly.  Pt reports HAs are worse in spring and fall.  HAs are 'knife like' behind R eye.  Denies focal weakness, numbness, facial droop, change in coordination.   Review of Systems For ROS see HPI     Objective:   Physical Exam  Constitutional: She is oriented to person, place, and time. She appears well-developed and well-nourished. No distress.  HENT:  Head: Normocephalic and atraumatic.  TMs WNL No TTP over sinuses Minimal nasal congestion  Eyes: Conjunctivae and EOM are normal. Pupils are equal, round, and reactive to light.  Neck: Normal range of motion. Neck supple.  Cardiovascular: Normal rate, regular rhythm, normal heart sounds and intact distal pulses.   Pulmonary/Chest: Effort normal and breath sounds normal. No respiratory distress. She has no wheezes. She has no rales.  Musculoskeletal: She exhibits no edema.  Lymphadenopathy:    She has no cervical adenopathy.  Neurological: She is alert and oriented to person, place, and time. She has normal reflexes. No cranial nerve deficit. Coordination normal.  Psychiatric: She has a normal mood and affect. Her behavior is normal. Judgment and thought content normal.  Vitals reviewed.         Assessment & Plan:

## 2014-05-21 ENCOUNTER — Encounter: Payer: Self-pay | Admitting: Family Medicine

## 2014-05-21 DIAGNOSIS — G44009 Cluster headache syndrome, unspecified, not intractable: Secondary | ICD-10-CM

## 2014-05-21 NOTE — Telephone Encounter (Signed)
Referral placed.

## 2014-05-24 ENCOUNTER — Ambulatory Visit (INDEPENDENT_AMBULATORY_CARE_PROVIDER_SITE_OTHER): Payer: 59 | Admitting: *Deleted

## 2014-05-24 ENCOUNTER — Encounter (HOSPITAL_COMMUNITY): Payer: Self-pay

## 2014-05-24 DIAGNOSIS — R001 Bradycardia, unspecified: Secondary | ICD-10-CM

## 2014-05-24 NOTE — Progress Notes (Signed)
FMLA paperwork faxed to our office from [employer] on behalf of patient. Paperwork completed and singed by Dr. Glori Bickers. Faxed to provided number [phone] Copy scanned into patient's chart for future reference.  Renee Pain

## 2014-05-24 NOTE — Progress Notes (Signed)
Remote pacemaker transmission.   

## 2014-05-27 LAB — CUP PACEART REMOTE DEVICE CHECK
Battery Remaining Percentage: 82 %
Brady Statistic AP VP Percent: 47 %
Brady Statistic AS VS Percent: 1 %
Date Time Interrogation Session: 20160516061818
Lead Channel Impedance Value: 400 Ohm
Lead Channel Pacing Threshold Amplitude: 0.5 V
Lead Channel Pacing Threshold Pulse Width: 0.4 ms
Lead Channel Pacing Threshold Pulse Width: 1 ms
Lead Channel Sensing Intrinsic Amplitude: 7.2 mV
Lead Channel Setting Pacing Amplitude: 2 V
Lead Channel Setting Pacing Amplitude: 2.125
Lead Channel Setting Pacing Amplitude: 2.5 V
Lead Channel Setting Pacing Pulse Width: 0.4 ms
Lead Channel Setting Sensing Sensitivity: 2 mV
MDC IDC MSMT BATTERY REMAINING LONGEVITY: 72 mo
MDC IDC MSMT BATTERY VOLTAGE: 2.96 V
MDC IDC MSMT LEADCHNL LV IMPEDANCE VALUE: 790 Ohm
MDC IDC MSMT LEADCHNL LV PACING THRESHOLD AMPLITUDE: 0.75 V
MDC IDC MSMT LEADCHNL RA IMPEDANCE VALUE: 410 Ohm
MDC IDC MSMT LEADCHNL RA SENSING INTR AMPL: 1.9 mV
MDC IDC MSMT LEADCHNL RV PACING THRESHOLD AMPLITUDE: 1.125 V
MDC IDC MSMT LEADCHNL RV PACING THRESHOLD PULSEWIDTH: 0.4 ms
MDC IDC PG SERIAL: 2936617
MDC IDC SET LEADCHNL LV PACING PULSEWIDTH: 1 ms
MDC IDC STAT BRADY AP VS PERCENT: 1 %
MDC IDC STAT BRADY AS VP PERCENT: 53 %
MDC IDC STAT BRADY RA PERCENT PACED: 47 %
MDC IDC STAT BRADY RV PERCENT PACED: 99 % — AB
Pulse Gen Model: 3210

## 2014-06-02 ENCOUNTER — Encounter: Payer: Self-pay | Admitting: Cardiology

## 2014-06-09 ENCOUNTER — Encounter: Payer: Self-pay | Admitting: Internal Medicine

## 2014-06-22 ENCOUNTER — Other Ambulatory Visit: Payer: Self-pay | Admitting: General Practice

## 2014-06-22 MED ORDER — SUMATRIPTAN SUCCINATE 50 MG PO TABS: ORAL_TABLET | ORAL | Status: DC

## 2014-07-13 ENCOUNTER — Encounter: Payer: Self-pay | Admitting: Neurology

## 2014-07-13 ENCOUNTER — Ambulatory Visit (INDEPENDENT_AMBULATORY_CARE_PROVIDER_SITE_OTHER): Payer: 59 | Admitting: Neurology

## 2014-07-13 VITALS — BP 110/68 | HR 66 | Resp 20 | Ht 62.0 in | Wt 130.1 lb

## 2014-07-13 DIAGNOSIS — G44019 Episodic cluster headache, not intractable: Secondary | ICD-10-CM | POA: Diagnosis not present

## 2014-07-13 MED ORDER — TOPIRAMATE 50 MG PO TABS: ORAL_TABLET | ORAL | Status: DC

## 2014-07-13 MED ORDER — AMBULATORY NON FORMULARY MEDICATION: Status: DC

## 2014-07-13 NOTE — Patient Instructions (Addendum)
1.  Start topiramate 50mg  tablets.  Take 1/2 tablet at bedtime for 7 days, then increase to 1 tablet at bedtime.  Call in 4 weeks with update.  Continue having labs for kidney function regularly. 2.  Will set you up for 100% O2 to take when you get a headache   Report  Taking:  Long-term:   Med Dose History  ChangeReorderDiscontinue   AMBULATORY NON FORMULARY MEDICATION  Summary: Medication Name: 100% O2 15 leters a min for 15-20 mins Via Nasal cannual, Print Start: 07/13/2014  Ord/Sold: 07/13/2014 (O)  Pharmacy: [facility] [place]  Report  Taking:  Long-term:   Med Dose History  ChangeReorderDiscontinue   Patient Sig: Medication Name: 100% O2 15 leters a min for 15-20 mins Via Nasal cannual   Ordered on: 07/13/2014   Authorized by: Tomi Likens, ADAM R   Dispense: 1 Bottle Stationery and portable . DX Cluster Headaches G44.01   Refills: 2 ordered   Admin Instructions: Medication Name: 100% O2 15 leters a min for 15-20 mins Via Nasal cannual        3.  Follow up in 3 months.

## 2014-07-13 NOTE — Progress Notes (Signed)
NEUROLOGY CONSULTATION NOTE  Angela Shepherd MRN: [id number] DOB: 12-30-1950  Referring provider: Dr. Birdie Riddle Primary care provider: Dr. Birdie Riddle  Reason for consult:  headache  HISTORY OF PRESENT ILLNESS: Angela Shepherd is a 64 year old right-handed woman with systolic CHF, hypertension who presents for migraine.  Records reviewed.  Onset:  In her 30s Location:  Circumscribed area in right frontal/temporal region Quality:  shooting Intensity:  10/10 Aura:  no Prodrome:  no Associated symptoms:  Nose runs, right eye lacrimation, photophobia, phonophobia.  Rarely, nausea. Duration:  1 hour at a time, occuring once to 3 times back to back. Frequency:  Episodic, usually during months of April, May or October.  Occurs daily between 1 to 4 weeks. Triggers/exacerbating factors:  none Relieving factors:  none Activity:  Cannot function  Past abortive medication:  Prednisone taper (previously effective but cannot take due to CHF), Tylenol Migraine Past preventative medication:  none Other past therapy:  none  Current abortive medication:  sumatriptan 50mg  (briefly effective but headache will recur) Current preventative medication:  amitriptyline 10mg  Other therapy:  none Other medication:  Lexapro, Lasix, aldactone  Caffeine:  1 cup of coffee and 1 cup of tea daily Alcohol:  no Smoker:  former Diet:  Healthy.  On fluid restriction Exercise:  Walks on treadmill daily Depression/stress:  Fairly stable.  Husband passed away last year.  Primary caregiver now for her elderly mother. Sleep hygiene:  poor Family history of headache:  Paternal grandfather (said he had "lightening bolts in my head"), mom Works in Audiological scientist.  Most recent creatine was 1.2.  Kidney function is tested every 3 months by her cardiologist and PCP.  PAST MEDICAL HISTORY: Past Medical History  Diagnosis Date  . Hypertrophic obstructive cardiomyopathy   . Hypokalemia   . Chronic systolic  CHF (congestive heart failure)     a. EF 30-35% dx in 2011 -> most recent 40-45% by echo 04/2012. b. s/p upgrade to BiV-PPM 05/08/12. c. Med titration limited by hypotension.  . Bradycardia     a. initial PPM placed 1994 at [facility] with gen change 2002. b. 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 Jude BiV PPM 05/08/12.  . Pneumonia 2008  . Cluster headache syndrome   . Hypotension   . Pacemaker     PAST SURGICAL HISTORY: Past Surgical History  Procedure Laterality Date  . Tonsillectomy  ~ 1959  . Tubal ligation  1984?  Marland Kitchen Pacemaker insertion  1994; 2002; 05/08/2012  . Bi-ventricular pacemaker upgrade N/A 05/08/2012    upgrade to [facility] (CRT-P) by Dr Rayann Heman    MEDICATIONS: Current Outpatient Prescriptions on File Prior to Visit  Medication Sig Dispense Refill  . aspirin EC 81 MG EC tablet Take 1 tablet (81 mg total) by mouth daily. 30 tablet 1  . carvedilol (COREG) 6.25 MG tablet Take 1 tablet (6.25 mg total) by mouth 2 (two) times daily with a meal. 60 tablet 1  . escitalopram (LEXAPRO) 10 MG tablet Take 1 tablet (10 mg total) by mouth daily. 30 tablet 6  . furosemide (LASIX) 80 MG tablet Take 0.5 tablets (40 mg total) by mouth 2 (two) times daily. 30 tablet 3  . lisinopril (PRINIVIL,ZESTRIL) 10 MG tablet Take 10 mg by mouth daily.    . nicotine (NICODERM CQ - DOSED IN MG/24 HOURS) 14 mg/24hr patch Place 1 patch (14 mg total) onto the skin daily. 28 patch 0  . spironolactone (ALDACTONE) 25 MG tablet  TAKE 1/2 TABLET BY MOUTH DAILY 15 tablet 6  . SUMAtriptan (IMITREX) 50 MG tablet Take 1 tablet at onset of headache. May repeat in 2 hours if headache persists or recurs. 10 tablet 6   No current facility-administered medications on file prior to visit.    ALLERGIES: Allergies  Allergen Reactions  . Prednisone     SOB    FAMILY HISTORY: Family History  Problem Relation Age of Onset  . Diabetes    . Cardiomyopathy Sister   . Heart disease Sister   . Colon  cancer Neg Hx   . Esophageal cancer Neg Hx   . Stomach cancer Neg Hx   . Rectal cancer Neg Hx   . Cancer Sister 33    uterine  . Dementia Mother   . Diabetes Brother     SOCIAL HISTORY: History   Social History  . Marital Status: Married    Spouse Name: N/A  . Number of Children: 2  . Years of Education: N/A   Occupational History  . [employer]    Social History Main Topics  . Smoking status: Former Smoker -- 3.00 packs/day  . Smokeless tobacco: Never Used  . Alcohol Use: 1.2 oz/week    2 Glasses of wine per week  . Drug Use: No  . Sexual Activity: No   Other Topics Concern  . Not on file   Social History Narrative   ** Merged History Encounter **        REVIEW OF SYSTEMS: Constitutional: No fevers, chills, or sweats, no generalized fatigue, change in appetite Eyes: No visual changes, double vision, eye pain Ear, nose and throat: No hearing loss, ear pain, nasal congestion, sore throat Cardiovascular: No chest pain, palpitations Respiratory:  No shortness of breath at rest or with exertion, wheezes GastrointestinaI: No nausea, vomiting, diarrhea, abdominal pain, fecal incontinence Genitourinary:  No dysuria, urinary retention or frequency Musculoskeletal:  No neck pain, back pain Integumentary: No rash, pruritus, skin lesions Neurological: as above Psychiatric: No depression, insomnia, anxiety Endocrine: No palpitations, fatigue, diaphoresis, mood swings, change in appetite, change in weight, increased thirst Hematologic/Lymphatic:  No anemia, purpura, petechiae. Allergic/Immunologic: no itchy/runny eyes, nasal congestion, recent allergic reactions, rashes  PHYSICAL EXAM: Filed Vitals:   07/13/14 0815  BP: 110/68  Pulse: 66  Resp: 20   General: No acute distress Head:  Normocephalic/atraumatic Eyes:  fundi unremarkable, without vessel changes, exudates, hemorrhages or papilledema. Neck: supple, no paraspinal tenderness, full range of  motion Back: No paraspinal tenderness Heart: regular rate and rhythm Lungs: Clear to auscultation bilaterally. Vascular: No carotid bruits. Neurological Exam: Mental status: alert and oriented to person, place, and time, recent and remote memory intact, fund of knowledge intact, attention and concentration intact, speech fluent and not dysarthric, language intact. Cranial nerves: CN I: not tested CN II: pupils equal, round and reactive to light, visual fields intact, fundi unremarkable, without vessel changes, exudates, hemorrhages or papilledema. CN III, IV, VI:  full range of motion, no nystagmus, no ptosis CN V: facial sensation intact CN VII: upper and lower face symmetric CN VIII: hearing intact CN IX, X: gag intact, uvula midline CN XI: sternocleidomastoid and trapezius muscles intact CN XII: tongue midline Bulk & Tone: normal, no fasciculations. Motor:  5/5 throughout Sensation:  Pinprick and vibration intact Deep Tendon Reflexes:  2+ throughout, toes downgoing Finger to nose testing:  No dysmetria Heel to shin:  No dysmetria Gait:  Normal station and stride.  Able to turn  and walk in tandem. Romberg negative.  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  Will try topiramate 25mg  daily x7 days, increasing to 50mg  at bedtime.  Will need renal function monitoring, which is performed regularly by her PCP and cardiologist.  I would not favor verapamil given her CHF.  Discontinue amitriptyline. 2.  Will set her up with 100% O2 for home use. 3.  She is to call in 4 weeks.  Follow up in 3 months.  Thank you for allowing me to take part in the care of this patient.  Metta Clines, DO  CC:  Annye Asa, MD  Glori Bickers, MD (cardiologist)

## 2014-07-13 NOTE — Addendum Note (Signed)
Addended by: Charyl Bigger E on: 07/13/2014 03:52 PM   Modules accepted: Orders

## 2014-07-22 ENCOUNTER — Other Ambulatory Visit: Payer: Self-pay | Admitting: Internal Medicine

## 2014-08-04 ENCOUNTER — Other Ambulatory Visit: Payer: Self-pay | Admitting: Internal Medicine

## 2014-08-04 NOTE — Telephone Encounter (Signed)
bensimhon refill. thank you for your time.

## 2014-08-19 ENCOUNTER — Other Ambulatory Visit: Payer: Self-pay | Admitting: Family Medicine

## 2014-08-19 NOTE — Telephone Encounter (Signed)
Medication filled to pharmacy as requested.   

## 2014-08-24 ENCOUNTER — Encounter: Payer: Self-pay | Admitting: Internal Medicine

## 2014-08-24 ENCOUNTER — Ambulatory Visit (INDEPENDENT_AMBULATORY_CARE_PROVIDER_SITE_OTHER): Payer: 59 | Admitting: *Deleted

## 2014-08-24 DIAGNOSIS — R001 Bradycardia, unspecified: Secondary | ICD-10-CM

## 2014-08-24 NOTE — Progress Notes (Signed)
Remote pacemaker transmission.   

## 2014-08-31 LAB — CUP PACEART REMOTE DEVICE CHECK
Battery Remaining Longevity: 88 mo
Brady Statistic AP VP Percent: 37 %
Brady Statistic AS VS Percent: 1 %
Brady Statistic RA Percent Paced: 37 %
Lead Channel Impedance Value: 460 Ohm
Lead Channel Impedance Value: 860 Ohm
Lead Channel Pacing Threshold Amplitude: 0.75 V
Lead Channel Pacing Threshold Amplitude: 1 V
Lead Channel Pacing Threshold Pulse Width: 0.4 ms
Lead Channel Pacing Threshold Pulse Width: 1 ms
Lead Channel Setting Pacing Amplitude: 2 V
Lead Channel Setting Pacing Amplitude: 2.5 V
Lead Channel Setting Pacing Pulse Width: 0.4 ms
Lead Channel Setting Pacing Pulse Width: 1 ms
Lead Channel Setting Sensing Sensitivity: 2 mV
MDC IDC MSMT BATTERY REMAINING PERCENTAGE: 95.5 %
MDC IDC MSMT BATTERY VOLTAGE: 2.96 V
MDC IDC MSMT LEADCHNL RA PACING THRESHOLD AMPLITUDE: 0.5 V
MDC IDC MSMT LEADCHNL RA SENSING INTR AMPL: 2.1 mV
MDC IDC MSMT LEADCHNL RV IMPEDANCE VALUE: 410 Ohm
MDC IDC MSMT LEADCHNL RV PACING THRESHOLD PULSEWIDTH: 0.4 ms
MDC IDC MSMT LEADCHNL RV SENSING INTR AMPL: 7.6 mV
MDC IDC SESS DTM: 20160816091049
MDC IDC SET LEADCHNL RA PACING AMPLITUDE: 2 V
MDC IDC STAT BRADY AP VS PERCENT: 1 %
MDC IDC STAT BRADY AS VP PERCENT: 63 %
Pulse Gen Serial Number: 2936617

## 2014-09-06 ENCOUNTER — Telehealth: Payer: Self-pay | Admitting: Family Medicine

## 2014-09-06 ENCOUNTER — Other Ambulatory Visit (HOSPITAL_COMMUNITY): Payer: Self-pay | Admitting: Adult Health

## 2014-09-06 NOTE — Telephone Encounter (Signed)
Relation to pt: self  Call back [phone]   Reason for call:  Patient requesting a refill ALPRAZolam (XANAX) 0.25 MG tablet

## 2014-09-06 NOTE — Telephone Encounter (Signed)
Last OV 05/19/14 Alprazolam last filled 01/23/13 # 30 with 0

## 2014-09-07 MED ORDER — ALPRAZOLAM 0.25 MG PO TABS: 0.2500 mg | ORAL_TABLET | Freq: Two times a day (BID) | ORAL | Status: DC | PRN

## 2014-09-07 NOTE — Telephone Encounter (Signed)
Medication filled to pharmacy as requested.   

## 2014-09-07 NOTE — Telephone Encounter (Signed)
Ok for #30 

## 2014-09-10 ENCOUNTER — Encounter: Payer: Self-pay | Admitting: Cardiology

## 2014-09-21 ENCOUNTER — Institutional Professional Consult (permissible substitution): Payer: 59 | Admitting: Internal Medicine

## 2014-10-15 ENCOUNTER — Encounter: Payer: Self-pay | Admitting: Family Medicine

## 2014-10-15 ENCOUNTER — Ambulatory Visit (INDEPENDENT_AMBULATORY_CARE_PROVIDER_SITE_OTHER): Payer: 59 | Admitting: Family Medicine

## 2014-10-15 VITALS — BP 90/50 | HR 69 | Temp 97.9°F | Ht 62.0 in | Wt 125.8 lb

## 2014-10-15 DIAGNOSIS — F411 Generalized anxiety disorder: Secondary | ICD-10-CM | POA: Diagnosis not present

## 2014-10-15 DIAGNOSIS — Z87891 Personal history of nicotine dependence: Secondary | ICD-10-CM

## 2014-10-15 NOTE — Progress Notes (Signed)
Pre visit review using our clinic review tool, if applicable. No additional management support is needed unless otherwise documented below in the visit note. 

## 2014-10-15 NOTE — Progress Notes (Signed)
   Subjective:    Patient ID: Angela Shepherd, female    DOB: [birth year], 64 y.o.   MRN: [id number]  HPI Anxiety- chronic problem, pt stopped Lexapro 'cold [place]'.  Stopped taking medication 2 weeks ago.  Pt reports side effects from stopping have nearly resolved.  Taking alprazolam 'every 2-3 days as needed'.  'taking 1/2 of a xanax 2-3x/week'.  Pt feels anxiety is well controlled.  Tobacco use- quit smoking 3 months ago.  Pt started smoking in her early 52s.  Pt is nervous about possibility of lung cancer.  Interested in [place] scan.  No cough, no SOB.   Review of Systems For ROS see HPI     Objective:   Physical Exam  Constitutional: She is oriented to person, place, and time. She appears well-developed and well-nourished. No distress.  HENT:  Head: Normocephalic and atraumatic.  Eyes: Conjunctivae and EOM are normal. Pupils are equal, round, and reactive to light.  Neck: Normal range of motion. Neck supple. No thyromegaly present.  Cardiovascular: Normal rate, regular rhythm, normal heart sounds and intact distal pulses.   No murmur heard. Pulmonary/Chest: Effort normal and breath sounds normal. No respiratory distress.  Abdominal: Soft. She exhibits no distension. There is no tenderness.  Musculoskeletal: She exhibits no edema.  Lymphadenopathy:    She has no cervical adenopathy.  Neurological: She is alert and oriented to person, place, and time.  Skin: Skin is warm and dry.  Psychiatric: She has a normal mood and affect. Her behavior is normal.  Vitals reviewed.         Assessment & Plan:

## 2014-10-15 NOTE — Patient Instructions (Signed)
Schedule your complete physical in 6 months We'll call you with your chest CT scan Keep up the good work on quitting smoking- you can do it!!! Continue the Alprazolam as needed Keep up the good work you look great!!! If you want to join [place] at the new [place] office, any scheduled appointments will automatically transfer and we will see you at [address] [place] [address] [place], [place] [address]  Happy Early Rudene Anda!!!

## 2014-10-17 NOTE — Assessment & Plan Note (Signed)
Pt quite smoking a few months ago but remains very concerned about the possibility of lung cancer due to 30 pack years.  No cough or current sxs, therefor she is a candidate for the lung cancer screening CT program.  CT ordered.  Will follow.

## 2014-10-17 NOTE — Assessment & Plan Note (Signed)
Recently improved.  Pt decided to stop Lexapro a few weeks ago and since the withdrawal sxs have stopped, feels 'pretty good'.  States she is able to 'get by' on rare alprazolam prn and this is how she would like to continue for now.  Will continue to follow.

## 2014-10-21 ENCOUNTER — Ambulatory Visit (HOSPITAL_BASED_OUTPATIENT_CLINIC_OR_DEPARTMENT_OTHER)
Admission: RE | Admit: 2014-10-21 | Discharge: 2014-10-21 | Disposition: A | Discharge status: Home / Self Care | Payer: 59 | Source: Ambulatory Visit | Attending: Family Medicine | Admitting: Family Medicine

## 2014-10-21 DIAGNOSIS — J439 Emphysema, unspecified: Secondary | ICD-10-CM | POA: Diagnosis not present

## 2014-10-21 DIAGNOSIS — J929 Pleural plaque without asbestos: Secondary | ICD-10-CM | POA: Diagnosis not present

## 2014-10-21 DIAGNOSIS — I2510 Atherosclerotic heart disease of native coronary artery without angina pectoris: Secondary | ICD-10-CM | POA: Insufficient documentation

## 2014-10-21 DIAGNOSIS — F17200 Nicotine dependence, unspecified, uncomplicated: Secondary | ICD-10-CM | POA: Insufficient documentation

## 2014-10-21 DIAGNOSIS — I517 Cardiomegaly: Secondary | ICD-10-CM | POA: Insufficient documentation

## 2014-10-21 DIAGNOSIS — Z122 Encounter for screening for malignant neoplasm of respiratory organs: Secondary | ICD-10-CM | POA: Diagnosis not present

## 2014-10-21 DIAGNOSIS — Z87891 Personal history of nicotine dependence: Secondary | ICD-10-CM

## 2014-10-25 ENCOUNTER — Encounter: Payer: Self-pay | Admitting: Family Medicine

## 2014-10-25 ENCOUNTER — Ambulatory Visit (INDEPENDENT_AMBULATORY_CARE_PROVIDER_SITE_OTHER): Payer: 59 | Admitting: Family Medicine

## 2014-10-25 VITALS — BP 108/72 | HR 67 | Temp 98.2°F | Resp 16 | Wt 128.2 lb

## 2014-10-25 DIAGNOSIS — R059 Cough, unspecified: Secondary | ICD-10-CM

## 2014-10-25 DIAGNOSIS — J441 Chronic obstructive pulmonary disease with (acute) exacerbation: Secondary | ICD-10-CM | POA: Diagnosis not present

## 2014-10-25 DIAGNOSIS — R05 Cough: Secondary | ICD-10-CM | POA: Diagnosis not present

## 2014-10-25 MED ORDER — AZITHROMYCIN 250 MG PO TABS: ORAL_TABLET | ORAL | Status: DC

## 2014-10-25 MED ORDER — PROMETHAZINE-DM 6.25-15 MG/5ML PO SYRP: 5.0000 mL | ORAL_SOLUTION | Freq: Four times a day (QID) | ORAL | Status: DC | PRN

## 2014-10-25 MED ORDER — ALBUTEROL SULFATE (2.5 MG/3ML) 0.083% IN NEBU
2.5000 mg | INHALATION_SOLUTION | Freq: Once | RESPIRATORY_TRACT | Status: AC
  Administered 2014-10-25: 2.5 mg via RESPIRATORY_TRACT

## 2014-10-25 MED ORDER — ALBUTEROL SULFATE HFA 108 (90 BASE) MCG/ACT IN AERS: 2.0000 | INHALATION_SPRAY | Freq: Four times a day (QID) | RESPIRATORY_TRACT | Status: DC | PRN

## 2014-10-25 NOTE — Progress Notes (Signed)
Pre visit review using our clinic review tool, if applicable. No additional management support is needed unless otherwise documented below in the visit note. 

## 2014-10-25 NOTE — Patient Instructions (Signed)
Follow up as needed Start the Zpack as directed Use the cough syrup as needed- will cause drowsiness Use the albuterol inhaler- 2 puffs every 4 hrs as needed for cough, shortness of breath, wheezing Call with any questions or concerns Hang in there!!!

## 2014-10-25 NOTE — Progress Notes (Signed)
   Subjective:    Patient ID: Angela Shepherd, female    DOB: 1950-04-29, 64 y.o.   MRN: [id number]  HPI URI- sxs started 5 days ago.  No HA.  'horrible scratchy throat that goes into spasms when I cough.  + chills, sweats.  Decreased taste, decreased appetite.  No sinus pain/pressure.  Cough is not productive.  'i have tried everything over the counter but nothing's working'.  No known sick contacts.  Hx of COPD.    Review of Systems For ROS see HPI     Objective:   Physical Exam  Constitutional: She is oriented to person, place, and time. She appears well-developed and well-nourished. No distress.  HENT:  Head: Normocephalic and atraumatic.  TMs normal bilaterally Mild nasal congestion Throat w/out erythema, edema, or exudate  Eyes: Conjunctivae and EOM are normal. Pupils are equal, round, and reactive to light.  Neck: Normal range of motion. Neck supple.  Cardiovascular: Normal rate, regular rhythm, normal heart sounds and intact distal pulses.   No murmur heard. Pulmonary/Chest: Effort normal. No respiratory distress. She has no wheezes.  + hacking cough Decreased BS diffusely- improved s/p neb tx  Lymphadenopathy:    She has no cervical adenopathy.  Neurological: She is alert and oriented to person, place, and time.  Skin: Skin is warm and dry.  Vitals reviewed.         Assessment & Plan:

## 2014-10-26 NOTE — Assessment & Plan Note (Signed)
Pt's sxs are consistent w/ COPD exacerbation.  Start abx.  Breathing improved s/p neb tx.  Prescription for albuterol HFA given.  Cough meds prn.  Reviewed supportive care and red flags that should prompt return.  Pt expressed understanding and is in agreement w/ plan.

## 2014-11-05 ENCOUNTER — Encounter: Payer: Self-pay | Admitting: Neurology

## 2014-11-05 ENCOUNTER — Ambulatory Visit (INDEPENDENT_AMBULATORY_CARE_PROVIDER_SITE_OTHER): Payer: 59 | Admitting: Neurology

## 2014-11-05 VITALS — BP 102/74 | HR 86 | Resp 16 | Ht 62.0 in | Wt 125.0 lb

## 2014-11-05 DIAGNOSIS — G44019 Episodic cluster headache, not intractable: Secondary | ICD-10-CM | POA: Diagnosis not present

## 2014-11-05 NOTE — Progress Notes (Signed)
NEUROLOGY FOLLOW UP OFFICE NOTE  [name] [id number]  HISTORY OF PRESENT ILLNESS: Angela Shepherd is a 64 year old right-handed woman with systolic CHF, hypertension who follows up for cluster headache.  UPDATE: She is doing well.  She takes topiramate 50mg .  She has not needed to use the O2.  BMP from April showed BUN 28 and Cr. 1.26.  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  Rarely, nausea. Initial Duration:  1 hour at a time, occuring once to 3 times back to back. Initial Frequency:  Episodic, usually during months of April, May or October.  Occurs daily between 1 to 4 weeks. Triggers/exacerbating factors:  none Relieving factors:  none Activity:  Cannot function  Past abortive medication:  Prednisone taper (previously effective but cannot take due to CHF), Tylenol Migraine, sumatriptan 50mg  Past preventative medication:  amitriptyline Other past therapy:  none  Caffeine:  1 cup of coffee and 1 cup of tea daily Alcohol:  no Smoker:  former Diet:  Healthy.  On fluid restriction Exercise:  Walks on treadmill daily Depression/stress:  Fairly stable.  Husband passed away last year.  Primary caregiver now for her elderly mother. Sleep hygiene:  poor Family history of headache:  Paternal grandfather (said he had "lightening bolts in my head"), mom Works in Audiological scientist.  PAST MEDICAL HISTORY: Past Medical History  Diagnosis Date  . Hypertrophic obstructive cardiomyopathy   . Hypokalemia   . Chronic systolic CHF (congestive heart failure) ([place])     a. EF 30-35% dx in 2011 -> most recent 40-45% by echo 04/2012. b. s/p upgrade to BiV-PPM 05/08/12. c. Med titration limited by hypotension.  . Bradycardia     a. initial PPM placed [facility] at [facility] with gen  change 2002. b. 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 Jude BiV PPM 05/08/12.  . Pneumonia 2008  . Cluster headache syndrome   . Hypotension   . Pacemaker     MEDICATIONS: Current Outpatient Prescriptions on File Prior to Visit  Medication Sig Dispense Refill  . ALPRAZolam (XANAX) 0.25 MG tablet Take 1 tablet (0.25 mg total) by mouth 2 (two) times daily as needed for anxiety. 30 tablet 0  . aspirin EC 81 MG EC tablet Take 1 tablet (81 mg total) by mouth daily. 30 tablet 1  . carvedilol (COREG) 12.5 MG tablet Take 2 tablets by mouth daily.  12  . furosemide (LASIX) 80 MG tablet TAKE 1/2 TABLET (40 MG TOTAL) BY MOUTH 2 (TWO) TIMES DAILY. 30 tablet 3  . lisinopril (PRINIVIL,ZESTRIL) 10 MG tablet TAKE 1 TABLET (10 MG TOTAL) BY MOUTH DAILY. 90 tablet 1  . nicotine (NICODERM CQ - DOSED IN MG/24 HOURS) 14 mg/24hr patch Place 1 patch (14 mg total) onto the skin daily. 28 patch 0  . spironolactone (ALDACTONE) 25 MG tablet TAKE 1/2 TABLET BY MOUTH DAILY 15 tablet 6  . topiramate (TOPAMAX) 50 MG tablet Take 0.5 tab at bedtime for 7 days, then 1 tab at bedtime (Patient taking differently: Take 1 tablet at bedtime) 30 tablet 0  . albuterol (PROVENTIL HFA;VENTOLIN HFA) 108 (90 BASE) MCG/ACT inhaler Inhale 2 puffs into the lungs every 6 (six) hours as needed for wheezing or shortness of breath. (Patient not taking: Reported on 11/05/2014) 1 Inhaler 2  .  AMBULATORY NON FORMULARY MEDICATION Medication Name: 100% O2 15 leters a min for 15-20 mins  Via Nasal cannual (Patient not taking: Reported on 11/05/2014) 1 Bottle 2  . promethazine-dextromethorphan (PROMETHAZINE-DM) 6.25-15 MG/5ML syrup Take 5 mLs by mouth 4 (four) times daily as needed. (Patient not taking: Reported on 11/05/2014) 240 mL 0  . SUMAtriptan (IMITREX) 50 MG tablet Take 1 tablet at onset of headache. May repeat in 2 hours if headache persists or recurs. (Patient not taking: Reported on 11/05/2014) 10 tablet 6   No current  facility-administered medications on file prior to visit.    ALLERGIES: Allergies  Allergen Reactions  . Prednisone     SOB    FAMILY HISTORY: Family History  Problem Relation Age of Onset  . Diabetes    . Cardiomyopathy Sister   . Heart disease Sister   . Colon cancer Neg Hx   . Esophageal cancer Neg Hx   . Stomach cancer Neg Hx   . Rectal cancer Neg Hx   . Cancer Sister 59    uterine  . Dementia Mother   . Diabetes Brother     SOCIAL HISTORY: Social History   Social History  . Marital Status: Married    Spouse Name: N/A  . Number of Children: 2  . Years of Education: N/A   Occupational History  . [employer]    Social History Main Topics  . Smoking status: Former Smoker -- 3.00 packs/day  . Smokeless tobacco: Never Used  . Alcohol Use: 1.2 oz/week    2 Glasses of wine per week  . Drug Use: No  . Sexual Activity: No   Other Topics Concern  . Not on file   Social History Narrative   ** Merged History Encounter **        REVIEW OF SYSTEMS: Constitutional: No fevers, chills, or sweats, no generalized fatigue, change in appetite Eyes: No visual changes, double vision, eye pain Ear, nose and throat: No hearing loss, ear pain, nasal congestion, sore throat Cardiovascular: No chest pain, palpitations Respiratory:  No shortness of breath at rest or with exertion, wheezes GastrointestinaI: No nausea, vomiting, diarrhea, abdominal pain, fecal incontinence Genitourinary:  No dysuria, urinary retention or frequency Musculoskeletal:  No neck pain, back pain Integumentary: No rash, pruritus, skin lesions Neurological: as above Psychiatric: No depression, insomnia, anxiety Endocrine: No palpitations, fatigue, diaphoresis, mood swings, change in appetite, change in weight, increased thirst Hematologic/Lymphatic:  No anemia, purpura, petechiae. Allergic/Immunologic: no itchy/runny eyes, nasal congestion, recent allergic reactions, rashes  PHYSICAL  EXAM: Filed Vitals:   11/05/14 1403  BP: 102/74  Pulse: 86  Resp: 16   General: No acute distress.  Patient appears well-groomed.   Head:  Normocephalic/atraumatic Eyes:  Fundoscopic exam unremarkable without vessel changes, exudates, hemorrhages or papilledema. Neck: supple, no paraspinal tenderness, full range of motion Heart:  Regular rate and rhythm Lungs:  Clear to auscultation bilaterally Back: No paraspinal tenderness Neurological Exam: alert and oriented to person, place, and time. Attention span and concentration intact, recent and remote memory intact, fund of knowledge intact.  Speech fluent and not dysarthric, language intact.  CN II-XII intact. Fundoscopic exam unremarkable without vessel changes, exudates, hemorrhages or papilledema.  Bulk and tone normal, muscle strength 5/5 throughout.  Sensation to light touch, temperature and vibration intact.  Deep tendon reflexes 2+ throughout, toes downgoing.  Finger to nose and heel to shin testing intact.  Gait normal.  IMPRESSION: Cluster headache, stable  PLAN: Topiramate 50mg  at  bedtime 100% O2 on hand if needed Follow up in 6 months.  15 minutes spent face to face with patient, over 50% spent discussing management.  Metta Clines, DO  CC:  Annye Asa, MD

## 2014-11-05 NOTE — Patient Instructions (Addendum)
Continue topiramate  Follow up in 6 months

## 2014-11-12 ENCOUNTER — Encounter: Payer: Self-pay | Admitting: Family Medicine

## 2014-11-12 MED ORDER — AZITHROMYCIN 250 MG PO TABS: ORAL_TABLET | ORAL | Status: DC

## 2014-11-12 NOTE — Telephone Encounter (Signed)
Medication filled to pharmacy as requested.   

## 2014-11-25 ENCOUNTER — Ambulatory Visit (INDEPENDENT_AMBULATORY_CARE_PROVIDER_SITE_OTHER): Payer: 59 | Admitting: *Deleted

## 2014-11-25 DIAGNOSIS — R001 Bradycardia, unspecified: Secondary | ICD-10-CM

## 2014-11-25 NOTE — Progress Notes (Signed)
Remote pacemaker transmission.   

## 2014-12-07 ENCOUNTER — Other Ambulatory Visit: Payer: Self-pay | Admitting: General Practice

## 2014-12-07 MED ORDER — ALPRAZOLAM 0.25 MG PO TABS: 0.2500 mg | ORAL_TABLET | Freq: Two times a day (BID) | ORAL | Status: DC | PRN

## 2014-12-07 NOTE — Telephone Encounter (Signed)
Medication filled to pharmacy as requested.   

## 2014-12-07 NOTE — Telephone Encounter (Signed)
Last OV 10-25-14 Alprazolam last filled 09-07-14 #30 with 0

## 2014-12-10 ENCOUNTER — Other Ambulatory Visit (HOSPITAL_COMMUNITY): Payer: Self-pay | Admitting: Adult Health

## 2014-12-10 LAB — CUP PACEART REMOTE DEVICE CHECK
Battery Remaining Longevity: 87 mo
Battery Remaining Percentage: 95.5 %
Battery Voltage: 2.96 V
Brady Statistic AP VP Percent: 34 %
Brady Statistic AS VP Percent: 66 %
Brady Statistic AS VS Percent: 1 %
Date Time Interrogation Session: 20161117090208
Implantable Lead Implant Date: 19940705
Implantable Lead Implant Date: 19940705
Implantable Lead Implant Date: 20140501
Implantable Lead Model: 4024
Lead Channel Impedance Value: 460 Ohm
Lead Channel Pacing Threshold Amplitude: 0.5 V
Lead Channel Pacing Threshold Amplitude: 0.75 V
Lead Channel Pacing Threshold Amplitude: 1.125 V
Lead Channel Pacing Threshold Pulse Width: 0.4 ms
Lead Channel Pacing Threshold Pulse Width: 1 ms
Lead Channel Sensing Intrinsic Amplitude: 4.4 mV
Lead Channel Setting Pacing Amplitude: 2 V
Lead Channel Setting Pacing Amplitude: 2.5 V
Lead Channel Setting Pacing Pulse Width: 0.4 ms
Lead Channel Setting Pacing Pulse Width: 1 ms
MDC IDC LEAD LOCATION: 753858
MDC IDC LEAD LOCATION: 753859
MDC IDC LEAD LOCATION: 753860
MDC IDC MSMT LEADCHNL LV IMPEDANCE VALUE: 710 Ohm
MDC IDC MSMT LEADCHNL RA PACING THRESHOLD PULSEWIDTH: 0.4 ms
MDC IDC MSMT LEADCHNL RV IMPEDANCE VALUE: 400 Ohm
MDC IDC MSMT LEADCHNL RV SENSING INTR AMPL: 6.4 mV
MDC IDC SET LEADCHNL RV PACING AMPLITUDE: 2.125
MDC IDC SET LEADCHNL RV SENSING SENSITIVITY: 2 mV
MDC IDC STAT BRADY AP VS PERCENT: 1 %
MDC IDC STAT BRADY RA PERCENT PACED: 33 %
Pulse Gen Model: 3210
Pulse Gen Serial Number: 2936617

## 2014-12-13 ENCOUNTER — Encounter: Payer: Self-pay | Admitting: Cardiology

## 2014-12-31 ENCOUNTER — Ambulatory Visit: Payer: Self-pay | Admitting: Family Medicine

## 2015-01-04 ENCOUNTER — Emergency Department (HOSPITAL_COMMUNITY)
Admission: EM | Admit: 2015-01-04 | Discharge: 2015-01-04 | Disposition: A | Discharge status: Home / Self Care | Payer: 59 | Attending: Emergency Medicine | Admitting: Emergency Medicine

## 2015-01-04 ENCOUNTER — Emergency Department (HOSPITAL_COMMUNITY): Payer: 59

## 2015-01-04 ENCOUNTER — Encounter: Payer: Self-pay | Admitting: Family Medicine

## 2015-01-04 ENCOUNTER — Encounter (HOSPITAL_COMMUNITY): Payer: Self-pay

## 2015-01-04 DIAGNOSIS — Z79899 Other long term (current) drug therapy: Secondary | ICD-10-CM | POA: Insufficient documentation

## 2015-01-04 DIAGNOSIS — Z8701 Personal history of pneumonia (recurrent): Secondary | ICD-10-CM | POA: Insufficient documentation

## 2015-01-04 DIAGNOSIS — Z8639 Personal history of other endocrine, nutritional and metabolic disease: Secondary | ICD-10-CM | POA: Insufficient documentation

## 2015-01-04 DIAGNOSIS — J069 Acute upper respiratory infection, unspecified: Secondary | ICD-10-CM

## 2015-01-04 DIAGNOSIS — I5022 Chronic systolic (congestive) heart failure: Secondary | ICD-10-CM | POA: Diagnosis not present

## 2015-01-04 DIAGNOSIS — Z7982 Long term (current) use of aspirin: Secondary | ICD-10-CM | POA: Diagnosis not present

## 2015-01-04 DIAGNOSIS — Z87891 Personal history of nicotine dependence: Secondary | ICD-10-CM | POA: Insufficient documentation

## 2015-01-04 DIAGNOSIS — Z8669 Personal history of other diseases of the nervous system and sense organs: Secondary | ICD-10-CM | POA: Insufficient documentation

## 2015-01-04 DIAGNOSIS — Z950 Presence of cardiac pacemaker: Secondary | ICD-10-CM | POA: Diagnosis not present

## 2015-01-04 DIAGNOSIS — R0602 Shortness of breath: Secondary | ICD-10-CM | POA: Diagnosis present

## 2015-01-04 LAB — BRAIN NATRIURETIC PEPTIDE: B Natriuretic Peptide: 336 pg/mL — ABNORMAL HIGH (ref 0.0–100.0)

## 2015-01-04 LAB — BASIC METABOLIC PANEL
ANION GAP: 9 (ref 5–15)
BUN: 25 mg/dL — ABNORMAL HIGH (ref 6–20)
CO2: 24 mmol/L (ref 22–32)
Calcium: 8.8 mg/dL — ABNORMAL LOW (ref 8.9–10.3)
Chloride: 105 mmol/L (ref 101–111)
Creatinine, Ser: 1 mg/dL (ref 0.44–1.00)
GFR calc Af Amer: >60 mL/min (ref >60)
GFR calc non Af Amer: 59 mL/min — ABNORMAL LOW (ref >60)
GLUCOSE: 108 mg/dL — AB (ref 65–99)
POTASSIUM: 4 mmol/L (ref 3.5–5.1)
Sodium: 138 mmol/L (ref 135–145)

## 2015-01-04 LAB — CBC
HEMATOCRIT: 35 % — AB (ref 36.0–46.0)
HEMOGLOBIN: 12.1 g/dL (ref 12.0–15.0)
MCH: 32.8 pg (ref 26.0–34.0)
MCHC: 34.6 g/dL (ref 30.0–36.0)
MCV: 94.9 fL (ref 78.0–100.0)
Platelets: 149 10*3/uL — ABNORMAL LOW (ref 150–400)
RBC: 3.69 MIL/uL — ABNORMAL LOW (ref 3.87–5.11)
RDW: 12.8 % (ref 11.5–15.5)
WBC: 8.8 10*3/uL (ref 4.0–10.5)

## 2015-01-04 LAB — TROPONIN I: Troponin I: 0.04 ng/mL — ABNORMAL HIGH (ref <0.031)

## 2015-01-04 MED ORDER — ALBUTEROL SULFATE HFA 108 (90 BASE) MCG/ACT IN AERS: 1.0000 | INHALATION_SPRAY | Freq: Four times a day (QID) | RESPIRATORY_TRACT | Status: DC | PRN

## 2015-01-04 MED ORDER — ALBUTEROL SULFATE (2.5 MG/3ML) 0.083% IN NEBU: 5.0000 mg | INHALATION_SOLUTION | Freq: Once | RESPIRATORY_TRACT | Status: DC

## 2015-01-04 NOTE — ED Notes (Signed)
Per EMS pt woke this morning with shortness of breath  Pt states she has been sick with a cough and cold for about a week and has been taking mucinex at home without relief  Per EMS pt has wheezing  Pt was given a neb and then a duoneb in route to the hospital  EMS started a saline lock in her right AC, tolerated well

## 2015-01-04 NOTE — ED Provider Notes (Signed)
CSN: [id number]     Arrival date & time 01/04/15  0450 History   First MD Initiated Contact with Patient 01/04/15 0455     Chief Complaint  Patient presents with  . Shortness of Breath     (Consider location/radiation/quality/duration/timing/severity/associated sxs/prior Treatment) Patient is a 64 y.o. female presenting with shortness of breath. The history is provided by the patient.  Shortness of Breath Associated symptoms: cough   Associated symptoms: no abdominal pain, no chest pain, no headaches, no rash and no vomiting    patient is a shortness of breath. She has had a cough the last few days. No production. No fevers. States she got worse today. States she tried oxygen home did not help. States she feels much better now after 2 EMS breathing treatments. No chest pain. No swelling or legs. Has a history of heart failure but states she does not feel volume overload at this time.  Past Medical History  Diagnosis Date  . Hypertrophic obstructive cardiomyopathy   . Hypokalemia   . Chronic systolic CHF (congestive heart failure) ([place])     a. EF 30-35% dx in 2011 -> most recent 40-45% by echo 04/2012. b. s/p upgrade to BiV-PPM 05/08/12. c. Med titration limited by hypotension.  . Bradycardia     a. initial PPM placed 1994 at [facility] with gen change 2002. b. 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 Jude BiV PPM 05/08/12.  . Pneumonia 2008  . Cluster headache syndrome   . Hypotension   . Pacemaker    Past Surgical History  Procedure Laterality Date  . Tonsillectomy  ~ 1959  . Tubal ligation  1984?  Marland Kitchen Pacemaker insertion  1994; 2002; 05/08/2012  . Bi-ventricular pacemaker upgrade N/A 05/08/2012    upgrade to [facility] Anthem (CRT-P) by Dr Rayann Heman   Family History  Problem Relation Age of Onset  . Diabetes    . Cardiomyopathy Sister   . Heart disease Sister   . Colon cancer Neg Hx   . Esophageal cancer Neg Hx   . Stomach cancer Neg Hx   . Rectal cancer Neg Hx   . Cancer  Sister 44    uterine  . Dementia Mother   . Diabetes Brother    Social History  Substance Use Topics  . Smoking status: Former Smoker -- 3.00 packs/day  . Smokeless tobacco: Never Used  . Alcohol Use: 1.2 oz/week    2 Glasses of wine per week   OB History    Gravida Para Term Preterm AB TAB SAB Ectopic Multiple Living   0 0 0 0 0 0 0 0       Review of Systems  Constitutional: Negative for activity change and appetite change.  Eyes: Negative for pain.  Respiratory: Positive for cough and shortness of breath. Negative for chest tightness.   Cardiovascular: Negative for chest pain and leg swelling.  Gastrointestinal: Negative for nausea, vomiting, abdominal pain and diarrhea.  Genitourinary: Negative for flank pain.  Musculoskeletal: Negative for back pain and neck stiffness.  Skin: Negative for rash.  Neurological: Negative for weakness, numbness and headaches.  Psychiatric/Behavioral: Negative for behavioral problems.      Allergies  Prednisone  Home Medications   Prior to Admission medications   Medication Sig Start Date End Date Taking? Authorizing Provider  ALPRAZolam (XANAX) 0.25 MG tablet Take 1 tablet (0.25 mg total) by mouth 2 (two) times daily as needed for anxiety. 12/07/14  Yes Midge Minium, MD  aspirin EC 81 MG EC tablet Take 1 tablet (81 mg total) by mouth daily. 04/13/14  Yes Reyne Dumas, MD  carvedilol (COREG) 12.5 MG tablet Take 12.5 mg by mouth 2 (two) times daily with a meal.  09/20/14  Yes Historical Provider, MD  furosemide (LASIX) 80 MG tablet TAKE 1/2 TABLET (40 MG TOTAL) BY MOUTH 2 (TWO) TIMES DAILY. 09/06/14  Yes Jolaine Artist, MD  guaiFENesin (MUCINEX) 600 MG 12 hr tablet Take 1,200 mg by mouth 2 (two) times daily as needed for cough.   Yes Historical Provider, MD  lisinopril (PRINIVIL,ZESTRIL) 10 MG tablet TAKE 1 TABLET (10 MG TOTAL) BY MOUTH DAILY. 08/05/14  Yes Shaune Pascal Bensimhon, MD  nicotine (NICODERM CQ - DOSED IN MG/24 HOURS) 14  mg/24hr patch Place 1 patch (14 mg total) onto the skin daily. 04/13/14  Yes Reyne Dumas, MD  spironolactone (ALDACTONE) 25 MG tablet TAKE 1/2 TABLET BY MOUTH DAILY 12/10/14  Yes Amy D Clegg, NP  topiramate (TOPAMAX) 50 MG tablet Take 0.5 tab at bedtime for 7 days, then 1 tab at bedtime Patient taking differently: Take 50 mg by mouth at bedtime. Take 1 tablet at bedtime 07/13/14  Yes Adam Telford Nab, DO  albuterol (PROVENTIL HFA;VENTOLIN HFA) 108 (90 BASE) MCG/ACT inhaler Inhale 1-2 puffs into the lungs every 6 (six) hours as needed for wheezing or shortness of breath. 01/04/15   Davonna Belling, MD  AMBULATORY NON FORMULARY MEDICATION Medication Name: 100% O2 15 leters a min for 15-20 mins  Via Nasal cannual Patient not taking: Reported on 11/05/2014 07/13/14   Pieter Partridge, DO  azithromycin (ZITHROMAX) 250 MG tablet 2 tabs on day 1, 1 tab on day 2-5 Patient not taking: Reported on 01/04/2015 11/12/14   Midge Minium, MD  promethazine-dextromethorphan (PROMETHAZINE-DM) 6.25-15 MG/5ML syrup Take 5 mLs by mouth 4 (four) times daily as needed. Patient not taking: Reported on 11/05/2014 10/25/14   Midge Minium, MD  SUMAtriptan (IMITREX) 50 MG tablet Take 1 tablet at onset of headache. May repeat in 2 hours if headache persists or recurs. Patient not taking: Reported on 11/05/2014 06/22/14   Midge Minium, MD   BP 92/51 mmHg  Pulse 77  Temp(Src) 97.4 F (36.3 C) (Oral)  Resp 18  SpO2 99%  LMP  (LMP Unknown) Physical Exam  Constitutional: She appears well-developed.  HENT:  Head: Atraumatic.  Neck: No JVD present.  Cardiovascular: Normal rate.   Pulmonary/Chest: Effort normal.  Slightly harsh breath sounds without wheezes or rales.  Abdominal: Soft.  Musculoskeletal: She exhibits no edema.  Neurological: She is alert.  Skin: Skin is warm.    ED Course  Procedures (including critical care time) Labs Review Labs Reviewed  BASIC METABOLIC PANEL - Abnormal; Notable for the  following:    Glucose, Bld 108 (*)    BUN 25 (*)    Calcium 8.8 (*)    GFR calc non Af Amer 59 (*)    All other components within normal limits  CBC - Abnormal; Notable for the following:    RBC 3.69 (*)    HCT 35.0 (*)    Platelets 149 (*)    All other components within normal limits  TROPONIN I - Abnormal; Notable for the following:    Troponin I 0.04 (*)    All other components within normal limits  BRAIN NATRIURETIC PEPTIDE - Abnormal; Notable for the following:    B Natriuretic Peptide 336.0 (*)    All other components within  normal limits    Imaging Review Dg Chest 2 View  01/04/2015  CLINICAL DATA:  Acute onset of shortness of breath and cough. Congestion and wheezing. Initial encounter. EXAM: CHEST  2 VIEW COMPARISON:  Chest radiograph performed 04/10/2014, and CT of the chest performed 10/21/2014 FINDINGS: The lungs are well-aerated. Chronic right lateral pleural thickening and scarring are seen, stable in appearance. No acute airspace consolidation is identified. Blunting of the right costophrenic angle also appears relatively stable. There is no evidence of focal opacification, pleural effusion or pneumothorax. The heart is borderline normal in size. A pacemaker is noted at the left chest wall, with leads ending at the right atrium, right ventricle and coronary sinus. No acute osseous abnormalities are seen. IMPRESSION: Chronic right lateral pleural thickening and scarring, stable in appearance. No acute airspace consolidation seen. Electronically Signed   By: Garald Balding M.D.   On: 01/04/2015 05:57   I have personally reviewed and evaluated these images and lab results as part of my medical decision-making.   EKG Interpretation   Date/Time:  Tuesday January 04 2015 05:08:54 EST Ventricular Rate:  76 PR Interval:  196 QRS Duration: 137 QT Interval:  441 QTC Calculation: 496 R Axis:   148 Text Interpretation:  Atrial-sensed ventricular-paced rhythm No further   analysis attempted due to paced rhythm Confirmed by Alvino Chapel  MD, Ovid Curd  [id number]) on 01/04/2015 5:22:55 AM      MDM   Final diagnoses:  URI (upper respiratory infection)    Patient presents with shortness of breath. Likely viral versus COPD. X-ray does not show CHF. She does not appear to be volume overloaded. Troponin is barely above normal, this appears be her baseline. BNP is mildly elevated. This also appears to be her baseline. Feels better after breathing treatment will discharge home.    Davonna Belling, MD 01/04/15 [id number]

## 2015-01-04 NOTE — ED Notes (Signed)
Pt maintains SPO2 >94% while ambulating around RN station x2. Pt denies SOB or dyspnea while ambulating.

## 2015-01-04 NOTE — Discharge Instructions (Signed)
Upper Respiratory Infection, Adult Most upper respiratory infections (URIs) are a viral infection of the air passages leading to the lungs. A URI affects the nose, throat, and upper air passages. The most common type of URI is nasopharyngitis and is typically referred to as "the common cold." URIs run their course and usually go away on their own. Most of the time, a URI does not require medical attention, but sometimes a bacterial infection in the upper airways can follow a viral infection. This is called a secondary infection. Sinus and middle ear infections are common types of secondary upper respiratory infections. Bacterial pneumonia can also complicate a URI. A URI can worsen asthma and chronic obstructive pulmonary disease (COPD). Sometimes, these complications can require emergency medical care and may be life threatening.  CAUSES Almost all URIs are caused by viruses. A virus is a type of germ and can spread from one person to another.  RISKS FACTORS You may be at risk for a URI if:   You smoke.   You have chronic heart or lung disease.  You have a weakened defense (immune) system.   You are very young or very old.   You have nasal allergies or asthma.  You work in crowded or poorly ventilated areas.  You work in health care facilities or schools. SIGNS AND SYMPTOMS  Symptoms typically develop 2-3 days after you come in contact with a cold virus. Most viral URIs last 7-10 days. However, viral URIs from the influenza virus (flu virus) can last 14-18 days and are typically more severe. Symptoms may include:   Runny or stuffy (congested) nose.   Sneezing.   Cough.   Sore throat.   Headache.   Fatigue.   Fever.   Loss of appetite.   Pain in your forehead, behind your eyes, and over your cheekbones (sinus pain).  Muscle aches.  DIAGNOSIS  Your health care provider may diagnose a URI by:  Physical exam.  Tests to check that your symptoms are not due to  another condition such as:  Strep throat.  Sinusitis.  Pneumonia.  Asthma. TREATMENT  A URI goes away on its own with time. It cannot be cured with medicines, but medicines may be prescribed or recommended to relieve symptoms. Medicines may help:  Reduce your fever.  Reduce your cough.  Relieve nasal congestion. HOME CARE INSTRUCTIONS   Take medicines only as directed by your health care provider.   Gargle warm saltwater or take cough drops to comfort your throat as directed by your health care provider.  Use a warm mist humidifier or inhale steam from a shower to increase air moisture. This may make it easier to breathe.  Drink enough fluid to keep your urine clear or pale yellow.   Eat soups and other clear broths and maintain good nutrition.   Rest as needed.   Return to work when your temperature has returned to normal or as your health care provider advises. You may need to stay home longer to avoid infecting others. You can also use a face mask and careful hand washing to prevent spread of the virus.  Increase the usage of your inhaler if you have asthma.   Do not use any tobacco products, including cigarettes, chewing tobacco, or electronic cigarettes. If you need help quitting, ask your health care provider. PREVENTION  The best way to protect yourself from getting a cold is to practice good hygiene.   Avoid oral or hand contact with people with cold   symptoms.   Wash your hands often if contact occurs.  There is no clear evidence that vitamin C, vitamin E, echinacea, or exercise reduces the chance of developing a cold. However, it is always recommended to get plenty of rest, exercise, and practice good nutrition.  SEEK MEDICAL CARE IF:   You are getting worse rather than better.   Your symptoms are not controlled by medicine.   You have chills.  You have worsening shortness of breath.  You have brown or red mucus.  You have yellow or brown nasal  discharge.  You have pain in your face, especially when you bend forward.  You have a fever.  You have swollen neck glands.  You have pain while swallowing.  You have white areas in the back of your throat. SEEK IMMEDIATE MEDICAL CARE IF:   You have severe or persistent:  Headache.  Ear pain.  Sinus pain.  Chest pain.  You have chronic lung disease and any of the following:  Wheezing.  Prolonged cough.  Coughing up blood.  A change in your usual mucus.  You have a stiff neck.  You have changes in your:  Vision.  Hearing.  Thinking.  Mood. MAKE SURE YOU:   Understand these instructions.  Will watch your condition.  Will get help right away if you are not doing well or get worse.   This information is not intended to replace advice given to you by your health care provider. Make sure you discuss any questions you have with your health care provider.   Document Released: 06/20/2000 Document Revised: 05/11/2014 Document Reviewed: 04/01/2013 Elsevier Interactive Patient Education 2016 [employer]  

## 2015-01-05 ENCOUNTER — Ambulatory Visit (INDEPENDENT_AMBULATORY_CARE_PROVIDER_SITE_OTHER): Payer: 59 | Admitting: Family Medicine

## 2015-01-05 ENCOUNTER — Encounter: Payer: Self-pay | Admitting: Family Medicine

## 2015-01-05 VITALS — BP 120/82 | HR 70 | Resp 19 | Wt 127.0 lb

## 2015-01-05 DIAGNOSIS — J069 Acute upper respiratory infection, unspecified: Secondary | ICD-10-CM | POA: Diagnosis not present

## 2015-01-05 DIAGNOSIS — J441 Chronic obstructive pulmonary disease with (acute) exacerbation: Secondary | ICD-10-CM

## 2015-01-05 MED ORDER — PROMETHAZINE-DM 6.25-15 MG/5ML PO SYRP: 5.0000 mL | ORAL_SOLUTION | Freq: Four times a day (QID) | ORAL | Status: DC | PRN

## 2015-01-05 MED ORDER — DOXYCYCLINE HYCLATE 100 MG PO TABS: 100.0000 mg | ORAL_TABLET | Freq: Two times a day (BID) | ORAL | Status: DC

## 2015-01-05 MED ORDER — BUDESONIDE-FORMOTEROL FUMARATE 160-4.5 MCG/ACT IN AERO: 2.0000 | INHALATION_SPRAY | Freq: Two times a day (BID) | RESPIRATORY_TRACT | Status: DC

## 2015-01-05 MED ORDER — IPRATROPIUM-ALBUTEROL 0.5-2.5 (3) MG/3ML IN SOLN
3.0000 mL | Freq: Once | RESPIRATORY_TRACT | Status: AC
  Administered 2015-01-05: 3 mL via RESPIRATORY_TRACT

## 2015-01-05 NOTE — Patient Instructions (Signed)
Follow up as needed Start the Doxycycline twice daily- take w/ food Start the Symbicort- 2 puffs twice daily- until feeling better Use the Albuterol as needed Mucinex DM for daytime cough Promethazine cough syrup for nights/weekends Call with any questions or concerns Hang in there! Happy New Year!!!

## 2015-01-05 NOTE — Progress Notes (Signed)
   Subjective:    Patient ID: Angela Shepherd, female    DOB: 1950/10/19, 64 y.o.   MRN: [id number]  HPI ED f/u- pt reports she developed a cold last week 'and then it went to my chest and I was done'.  Had to go to ER last night via ambulance due to SOB- had 2 breathing txs en route.  Had CXR that showed no evidence of volume overload.  Pt is intolerant to prednisone- caused CHF last time.  No improvement w/ O2 at home.  Pt has albuterol inhaler that is not providing relief.  Pt has hx of COPD but was not given any abx or change in medications in ER.  Was instructed to f/u w/ PCP.  No fevers.  No known sick contacts.  + cough and severe SOB   Review of Systems For ROS see HPI     Objective:   Physical Exam  Constitutional: She is oriented to person, place, and time. She appears well-developed and well-nourished. No distress.  HENT:  Head: Normocephalic and atraumatic.  TMs normal bilaterally Mild nasal congestion Throat w/out erythema, edema, or exudate  Eyes: Conjunctivae and EOM are normal. Pupils are equal, round, and reactive to light.  Neck: Normal range of motion. Neck supple.  Cardiovascular: Normal rate, regular rhythm, normal heart sounds and intact distal pulses.   No murmur heard. Pulmonary/Chest: She is in respiratory distress (pt unable to speak in complete sentences- dramatically improved s/p neb tx). She has wheezes (diffuse expiratory wheezes w/ poor air movement- improved s/p neb tx).  + hacking cough  Lymphadenopathy:    She has no cervical adenopathy.  Neurological: She is alert and oriented to person, place, and time.  Psychiatric: She has a normal mood and affect. Her behavior is normal. Thought content normal.  Vitals reviewed.         Assessment & Plan:

## 2015-01-05 NOTE — Progress Notes (Signed)
Pre visit review using our clinic review tool, if applicable. No additional management support is needed unless otherwise documented below in the visit note. 

## 2015-01-05 NOTE — Assessment & Plan Note (Signed)
Pt w/ acute COPD exacerbation.  Upon presentation was very SOB and unable to talk in complete sentences.  sxs dramatically improved s/p Duoneb.  Unable to tolerate systemic steroids due to CHF.  Start Symbicort in addition to albuterol.  Start abx.  Reviewed supportive care and red flags that should prompt return.  Pt expressed understanding and is in agreement w/ plan.

## 2015-01-06 ENCOUNTER — Ambulatory Visit: Payer: 59 | Admitting: Family Medicine

## 2015-01-06 ENCOUNTER — Encounter: Payer: Self-pay | Admitting: Family Medicine

## 2015-01-07 ENCOUNTER — Telehealth: Payer: Self-pay | Admitting: Family Medicine

## 2015-01-07 MED ORDER — IPRATROPIUM-ALBUTEROL 0.5-2.5 (3) MG/3ML IN SOLN: RESPIRATORY_TRACT | Status: DC

## 2015-01-07 NOTE — Telephone Encounter (Signed)
Duoneb q4-6 prn, disp 1 box

## 2015-01-07 NOTE — Telephone Encounter (Signed)
Called pharmacy and found out there are #30 in a box rx written for 68mL to dispense a full box. Pt notified.

## 2015-01-07 NOTE — Telephone Encounter (Signed)
Relation to [id number]  Call back number: work [phone] Pharmacy: [facility], [place] [phone] ([phone] (Fax)         Reason for call:  Patient states she received her "nebulizer" from [facility] and the "nebulizer treatment" didn't come with it and she called [facility] they advised she would have to pay approximately $30 whereas if patient gets its thru [facility] it will be cheaper. Patient would like to pick up treatment from pharmacy today requesting Rx

## 2015-01-07 NOTE — Telephone Encounter (Signed)
Please advise on the qty and dose for the duoneb

## 2015-01-12 ENCOUNTER — Telehealth: Payer: Self-pay | Admitting: *Deleted

## 2015-01-12 NOTE — Telephone Encounter (Signed)
Received Respiratory Order Form from [employer] for patient, requesting provider signature and OV notes, last OV printed and attached to paperwork; forwarded to provider/SLS

## 2015-01-14 NOTE — Telephone Encounter (Signed)
Faxed to [facility] successfully, sent for scanning. JG//CMA

## 2015-01-19 ENCOUNTER — Other Ambulatory Visit: Payer: Self-pay | Admitting: Family Medicine

## 2015-01-19 ENCOUNTER — Telehealth (HOSPITAL_COMMUNITY): Payer: Self-pay

## 2015-01-19 MED FILL — IPRAT-ALBUT 0.5-3(2.5) MG/3: 0.5-2.5 (3) | 5 days supply | Qty: 90 | Fill #0

## 2015-01-19 MED FILL — CARVEDILOL 12.5 MG TABLET: 12.5 | 30 days supply | Qty: 60 | Fill #7

## 2015-01-19 NOTE — Telephone Encounter (Signed)
[facility] for refill on Duoneb but this is excessive use.  Please make sure she is taking her Symbicort twice daily, every day, to control her symptoms.  If she continues to require nebs twice daily, we will need to get a chest xray and refer to pulmonary

## 2015-01-19 NOTE — Telephone Encounter (Signed)
Called pt to assess neb use after OV 01/05/15 for COPD exacerbation. She states she is using Duoneb BID q am and when she gets home from work. States she tried to cut back today but by 12pm she could not breathe and had to leave work early. She has about 5 Duoneb treatments left. Please advise refills and if you would like pt to schedule f/u OV.

## 2015-01-19 NOTE — Telephone Encounter (Signed)
Patient called CHF triage line to report increased BLEE and weight. Has not been seen since 04/2014 and was due to come back 06/2014, however, never made apt. Advised to take extra tablet of lasix (40mg ) this afternoon and call us in am with update on weight and swelling. Apt made for next week for first available apt with Amy per Patient request.  Renee Pain

## 2015-01-20 ENCOUNTER — Ambulatory Visit (HOSPITAL_BASED_OUTPATIENT_CLINIC_OR_DEPARTMENT_OTHER)
Admission: RE | Admit: 2015-01-20 | Discharge: 2015-01-20 | Disposition: A | Discharge status: Home / Self Care | Payer: 59 | Source: Ambulatory Visit | Attending: Family Medicine | Admitting: Family Medicine

## 2015-01-20 ENCOUNTER — Telehealth: Payer: Self-pay | Admitting: *Deleted

## 2015-01-20 ENCOUNTER — Other Ambulatory Visit: Payer: Self-pay | Admitting: Family Medicine

## 2015-01-20 ENCOUNTER — Ambulatory Visit (INDEPENDENT_AMBULATORY_CARE_PROVIDER_SITE_OTHER): Payer: 59 | Admitting: Family Medicine

## 2015-01-20 ENCOUNTER — Encounter: Payer: Self-pay | Admitting: Family Medicine

## 2015-01-20 VITALS — BP 92/60 | HR 79 | Temp 97.7°F | Ht 62.0 in | Wt 124.6 lb

## 2015-01-20 DIAGNOSIS — J449 Chronic obstructive pulmonary disease, unspecified: Secondary | ICD-10-CM | POA: Insufficient documentation

## 2015-01-20 DIAGNOSIS — I5023 Acute on chronic systolic (congestive) heart failure: Secondary | ICD-10-CM

## 2015-01-20 DIAGNOSIS — F411 Generalized anxiety disorder: Secondary | ICD-10-CM | POA: Diagnosis not present

## 2015-01-20 DIAGNOSIS — R0602 Shortness of breath: Secondary | ICD-10-CM | POA: Diagnosis not present

## 2015-01-20 LAB — CBC WITH DIFFERENTIAL/PLATELET
BASOS ABS: 0.1 10*3/uL (ref 0.0–0.1)
BASOS PCT: 1 % (ref 0–1)
Eosinophils Absolute: 0.3 10*3/uL (ref 0.0–0.7)
Eosinophils Relative: 4 % (ref 0–5)
HCT: 38.9 % (ref 36.0–46.0)
HEMOGLOBIN: 13.3 g/dL (ref 12.0–15.0)
Lymphocytes Relative: 28 % (ref 12–46)
Lymphs Abs: 2.3 10*3/uL (ref 0.7–4.0)
MCH: 31.9 pg (ref 26.0–34.0)
MCHC: 34.2 g/dL (ref 30.0–36.0)
MCV: 93.3 fL (ref 78.0–100.0)
MONO ABS: 0.7 10*3/uL (ref 0.1–1.0)
MPV: 10.6 fL (ref 8.6–12.4)
Monocytes Relative: 9 % (ref 3–12)
NEUTROS ABS: 4.8 10*3/uL (ref 1.7–7.7)
Neutrophils Relative %: 58 % (ref 43–77)
Platelets: 243 10*3/uL (ref 150–400)
RBC: 4.17 MIL/uL (ref 3.87–5.11)
RDW: 13.6 % (ref 11.5–15.5)
WBC: 8.2 10*3/uL (ref 4.0–10.5)

## 2015-01-20 LAB — BASIC METABOLIC PANEL
BUN: 32 mg/dL — ABNORMAL HIGH (ref 7–25)
CALCIUM: 9.5 mg/dL (ref 8.6–10.4)
CO2: 26 mmol/L (ref 20–31)
Chloride: 100 mmol/L (ref 98–110)
Creat: 1.59 mg/dL — ABNORMAL HIGH (ref 0.50–0.99)
GLUCOSE: 99 mg/dL (ref 65–99)
Potassium: 3.8 mmol/L (ref 3.5–5.3)
SODIUM: 139 mmol/L (ref 135–146)

## 2015-01-20 LAB — BRAIN NATRIURETIC PEPTIDE: BRAIN NATRIURETIC PEPTIDE: 161 pg/mL — AB (ref 0.0–100.0)

## 2015-01-20 NOTE — Progress Notes (Signed)
Pre visit review using our clinic review tool, if applicable. No additional management support is needed unless otherwise documented below in the visit note. 

## 2015-01-20 NOTE — Patient Instructions (Signed)
We'll notify you of your lab results and make any changes if needed Call Cardiology and see if they can see you sooner Continue the Symbicort and breathing treatments as needed Take your Lasix as directed by Cardiology Call with any questions or concerns Hang in there!!!

## 2015-01-20 NOTE — Telephone Encounter (Signed)
Yes- pt needs repeat CXR since her symptoms have not improved.  She should schedule an appt for today or tomorrow if possible

## 2015-01-20 NOTE — Progress Notes (Signed)
   Subjective:    Patient ID: Angela Shepherd, female    DOB: [birth year], 65 y.o.   MRN: [id number]  HPI SOB- Pt reports last weekend she was better- 'no shortness of breath, able to do chores' but by Monday/Tuesday she was again 'unable to breathe'.  Having high anxiety due to SOB.  Pt reports she had increased fluid- took extra Lasix yesterday and today and is down 5 lbs (called Dr Haroldine Laws).  No fevers.  Denies cough.  Using Symbicort twice daily and using nebulizer 2-3x/days.   Review of Systems For ROS see HPI     Objective:   Physical Exam  Constitutional: She is oriented to person, place, and time. She appears well-developed and well-nourished. No distress.  HENT:  Head: Normocephalic and atraumatic.  Mouth/Throat: Oropharynx is clear and moist.  No TTP over sinuses  Neck: Normal range of motion. Neck supple.  Cardiovascular: Normal rate, regular rhythm and normal heart sounds.   Pulmonary/Chest: Effort normal and breath sounds normal. No respiratory distress. She has no wheezes. She has no rales.  Musculoskeletal: She exhibits edema (trace LE edema bilaterally). She exhibits no tenderness.  Lymphadenopathy:    She has no cervical adenopathy.  Neurological: She is alert and oriented to person, place, and time.  Skin: Skin is warm and dry.  Psychiatric:  anxious  Vitals reviewed.         Assessment & Plan:

## 2015-01-20 NOTE — Telephone Encounter (Signed)
Pt states she has been taking Symbicort BID every day, and sometimes she is taking it more frequently and is still requiring neb treatments BID at minimum. She woke up at midnight last night to do a breathing treatment and then did another at 5am today. On phone, she has dry hacking cough and states that she felt better for a few days but then her shortness of breath returned. Advised that you would like to complete CXR. Pt stated that she had CXR 01/04/2015 and wanted to confirm that you want her to do another one. Please advise.

## 2015-01-20 NOTE — Telephone Encounter (Signed)
Appointment scheduled 01/20/2015 for f/u since pt's SOB has not improved w/ neb treatments.

## 2015-01-20 NOTE — Telephone Encounter (Signed)
Please advise. See previous documentation for albuterol neb refill. Pt requesting refill because her symptoms have not resolved.

## 2015-01-21 ENCOUNTER — Telehealth: Payer: Self-pay | Admitting: *Deleted

## 2015-01-21 LAB — TSH: TSH: 2.285 u[IU]/mL (ref 0.350–4.500)

## 2015-01-21 NOTE — Telephone Encounter (Signed)
Called pt per Dr. Birdie Riddle request to follow up. Pt reports using neb treatments today at 12am, 4am, 12pm, and "after work." She states that she is using the nebs because she "can't breathe." Her symptoms are slightly worse at night and slightly worse when she experiences changes in air temp from hot to cold or vice versa. She states no noticeable improvement in symptoms, except after the 4am treatment when she felt "so great that I mopped the kitchen floor and did laundry." She states she is taking a 1/2 tab of Xanax w/ every breathing treatment to minimize her panic over not being able to breathe; states Xanax takes the edge off. She denies any other cause for anxiety besides air hunger.   I encouraged pt to follow up w/ cardiology per Dr. Virgil Benedict recommendation. Pt states she has cards appt next Thursday. Informed pt that Dr. Birdie Riddle will also be referring her to pulmonology. Pt requests referral to Dr. Lamonte Sakai if possible because she has seen him in the past.

## 2015-01-23 NOTE — Assessment & Plan Note (Signed)
Deteriorated.  Pt has increased anxiety due to her shortness of breath and air hunger.  Encouraged her to take her alprazolam as needed to help prevent panic.  Will follow.

## 2015-01-23 NOTE — Assessment & Plan Note (Signed)
Recurrent issue for pt.  She has had mild volume overload the last 2 days and increased her lasix after calling Cardiology.  Only trace LE edema today.  Lungs CTAB.  Will get BNP, CXR.  Pt has f/u w/ CHF clinic next week.  Will follow closely.

## 2015-01-23 NOTE — Assessment & Plan Note (Signed)
Chronic problem.  Deteriorated.  Pt is using her Symbicort daily and now requiring nebulizer use at least twice daily.  Pt is unable to tolerate Prednisone due to hx of florid CHF caused by prednisone.  Get repeat CXR.  Encouraged regular nebs until pt is able to see pulmonary for complete evaluation and tx.  Pt expressed understanding and is in agreement w/ plan.

## 2015-01-25 ENCOUNTER — Other Ambulatory Visit: Payer: Self-pay | Admitting: Internal Medicine

## 2015-01-25 MED FILL — LISINOPRIL 10 MG TABLET: 10 | 90 days supply | Qty: 90 | Fill #0

## 2015-01-27 ENCOUNTER — Encounter (HOSPITAL_COMMUNITY): Payer: 59

## 2015-01-27 ENCOUNTER — Other Ambulatory Visit (HOSPITAL_COMMUNITY): Payer: Self-pay | Admitting: *Deleted

## 2015-01-27 DIAGNOSIS — J440 Chronic obstructive pulmonary disease with acute lower respiratory infection: Secondary | ICD-10-CM | POA: Diagnosis not present

## 2015-01-27 MED ORDER — FUROSEMIDE 80 MG PO TABS: ORAL_TABLET | ORAL | Status: DC

## 2015-01-27 MED FILL — FUROSEMIDE 80 MG TABLET: 80 | 30 days supply | Qty: 30 | Fill #0

## 2015-02-02 ENCOUNTER — Telehealth: Payer: Self-pay | Admitting: *Deleted

## 2015-02-02 NOTE — Telephone Encounter (Signed)
LMOM for [place] w/ [facility] to call back regarding pulse oximetry report received via fax.

## 2015-02-04 NOTE — Telephone Encounter (Signed)
Spoke w/ Allen Derry w/ [facility]. She says that based on pt's pulse oximetry report, pt needs capnography testing to determine next steps/interventions. She is going to have [facility] fax capnography order form to our office today for Dr. Birdie Riddle to sign. She will come to office in person next week to bring updated order forms.

## 2015-02-14 ENCOUNTER — Other Ambulatory Visit: Payer: Self-pay

## 2015-02-14 ENCOUNTER — Other Ambulatory Visit: Payer: Self-pay | Admitting: Family Medicine

## 2015-02-14 MED ORDER — ALPRAZOLAM 0.25 MG PO TABS: 0.2500 mg | ORAL_TABLET | Freq: Two times a day (BID) | ORAL | Status: DC | PRN

## 2015-02-14 MED FILL — ALPRAZolam 0.25 MG TABS: 0.25 | 15 days supply | Qty: 30 | Fill #0

## 2015-02-14 MED FILL — VENTOLIN HFA 90 MCG INHALER: 108 (90 BAS | 30 days supply | Qty: 18 | Fill #2

## 2015-02-14 MED FILL — IPRAT-ALBUT 0.5-3(2.5) MG/3: 0.5-2.5 (3) | 10 days supply | Qty: 180 | Fill #0

## 2015-02-14 MED FILL — SUMATRIPTAN SUCC 50 MG TAB: 50 | 30 days supply | Qty: 9 | Fill #2

## 2015-02-14 MED FILL — TOPIRAMATE 50 MG TABLET: 50 | 30 days supply | Qty: 30 | Fill #1

## 2015-02-14 NOTE — Telephone Encounter (Signed)
Medication filled to pharmacy as requested.   

## 2015-02-15 ENCOUNTER — Telehealth: Payer: Self-pay | Admitting: General Practice

## 2015-02-15 NOTE — Telephone Encounter (Signed)
Spoke with Andee Poles with [facility]. She states that per paperwork they feel that pt can have her O2 d/c'd since she is not having an issue with her desaturation. [facility] feels that she is suffering from OSA, and are recommending that pt undergo a home sleep test. Paperwork was started and placed with PCP.   Called pt and LMOVM to verify that she is willing to undergo home sleep test? Who is pt currently getting her oxygen from (Need Company Name) ? And does she have a CPAP machine at home?

## 2015-02-15 NOTE — Telephone Encounter (Signed)
Called Danielle back in regards to paperwork that was dropped off yesterday for patient. LMOVM.

## 2015-02-15 NOTE — Telephone Encounter (Signed)
I don't object to pt having a sleep study but she was referred to pulmonary in mid-January and this is something they will handle as this work up continues to become more and more involved.  Ok to d/c O2.  My initial order was for pt to have nebulizer and she has subsequently had overnight pulse oximetry and capnography testing.  At this time, no additional orders until she sees Pulm

## 2015-02-15 NOTE — Telephone Encounter (Signed)
-----   Message from Oneta Rack sent at 02/15/2015 10:30 AM EST ----- Regarding: returning your call  Contact: [phone] Caller name: [name] from [facility]  Call back number: [phone]   Reason for call:returning your call

## 2015-02-16 ENCOUNTER — Encounter: Payer: Self-pay | Admitting: Family Medicine

## 2015-02-16 NOTE — Telephone Encounter (Signed)
called and discussed with pt, documented in phone note.

## 2015-02-16 NOTE — Telephone Encounter (Signed)
Agree 

## 2015-02-16 NOTE — Telephone Encounter (Signed)
Pt is returning your call. She says that it is okay to call her at work.

## 2015-02-16 NOTE — Telephone Encounter (Signed)
Called and spoke with pt, she does not want [employer] to d/c her oxygen and will not undergo any further testing with them. Pt wants to wait for Dr. Lamonte Sakai (appt 03/25/15) to change anything if he feels it necessary)

## 2015-02-17 MED FILL — CARVEDILOL 12.5 MG TABLET: 12.5 | 30 days supply | Qty: 60 | Fill #8

## 2015-02-22 MED FILL — FUROSEMIDE 80 MG TABLET: 80 | 30 days supply | Qty: 30 | Fill #1

## 2015-02-23 ENCOUNTER — Encounter: Payer: Self-pay | Admitting: Family Medicine

## 2015-02-24 ENCOUNTER — Ambulatory Visit (INDEPENDENT_AMBULATORY_CARE_PROVIDER_SITE_OTHER): Payer: 59 | Admitting: *Deleted

## 2015-02-24 DIAGNOSIS — R001 Bradycardia, unspecified: Secondary | ICD-10-CM

## 2015-02-24 MED FILL — REA LO 40 CREAM: 40 | 30 days supply | Qty: 198 | Fill #0

## 2015-02-24 NOTE — Progress Notes (Signed)
Remote pacemaker transmission.   

## 2015-03-01 ENCOUNTER — Encounter: Payer: Self-pay | Admitting: Family Medicine

## 2015-03-04 ENCOUNTER — Ambulatory Visit (HOSPITAL_COMMUNITY)
Admission: RE | Admit: 2015-03-04 | Discharge: 2015-03-04 | Disposition: A | Discharge status: Home / Self Care | Payer: 59 | Source: Ambulatory Visit | Attending: Internal Medicine | Admitting: Internal Medicine

## 2015-03-04 VITALS — BP 92/0 | HR 84 | Wt 129.4 lb

## 2015-03-04 DIAGNOSIS — Z79899 Other long term (current) drug therapy: Secondary | ICD-10-CM | POA: Insufficient documentation

## 2015-03-04 DIAGNOSIS — Z87891 Personal history of nicotine dependence: Secondary | ICD-10-CM | POA: Insufficient documentation

## 2015-03-04 DIAGNOSIS — I5022 Chronic systolic (congestive) heart failure: Secondary | ICD-10-CM | POA: Insufficient documentation

## 2015-03-04 DIAGNOSIS — I421 Obstructive hypertrophic cardiomyopathy: Secondary | ICD-10-CM | POA: Diagnosis not present

## 2015-03-04 DIAGNOSIS — I5042 Chronic combined systolic (congestive) and diastolic (congestive) heart failure: Secondary | ICD-10-CM | POA: Insufficient documentation

## 2015-03-04 DIAGNOSIS — Z950 Presence of cardiac pacemaker: Secondary | ICD-10-CM | POA: Insufficient documentation

## 2015-03-04 DIAGNOSIS — Z7982 Long term (current) use of aspirin: Secondary | ICD-10-CM | POA: Diagnosis not present

## 2015-03-04 DIAGNOSIS — I110 Hypertensive heart disease with heart failure: Secondary | ICD-10-CM | POA: Insufficient documentation

## 2015-03-04 DIAGNOSIS — I5032 Chronic diastolic (congestive) heart failure: Secondary | ICD-10-CM

## 2015-03-04 DIAGNOSIS — R001 Bradycardia, unspecified: Secondary | ICD-10-CM | POA: Insufficient documentation

## 2015-03-04 LAB — BASIC METABOLIC PANEL
ANION GAP: 12 (ref 5–15)
BUN: 50 mg/dL — AB (ref 6–20)
CALCIUM: 9.3 mg/dL (ref 8.9–10.3)
CO2: 23 mmol/L (ref 22–32)
Chloride: 102 mmol/L (ref 101–111)
Creatinine, Ser: 1.61 mg/dL — ABNORMAL HIGH (ref 0.44–1.00)
GFR calc Af Amer: 38 mL/min — ABNORMAL LOW (ref >60)
GFR calc non Af Amer: 33 mL/min — ABNORMAL LOW (ref >60)
GLUCOSE: 91 mg/dL (ref 65–99)
Potassium: 4.4 mmol/L (ref 3.5–5.1)
Sodium: 137 mmol/L (ref 135–145)

## 2015-03-04 NOTE — Progress Notes (Signed)
ADVANCED HF CLINIC NOTE  Patient ID: Angela Shepherd, female   DOB: 1950/11/19, 65 y.o.   MRN: [id number] PCP: Dr. Birdie Riddle  HPI: Angela Shepherd is a 65 y/o woman who works in patient Systems developer at [employer]. 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 PMHx also notable for HTN and prior tobacco abuse (quit 02/2010).   Was admitted in December 2011 with acute HF. Echo with EF 30-35%, There was also evidence of ASH with septum of 1.7 PW = 1.9. Diuresed and scheduled for outpt cath.  Cath 12/2009: Normal coronaries. EF 40-45%.  Central aortic pressure was 86/47 with a mean of 62.  LV pressure is 74/13 with an EDP of 2.  Right atrial pressure mean of 3.  RV pressure 26/3 with an EDP of 5.  PA pressure 28/5 with a mean of 16.  Pulmonary capillary wedge pressure mean of 7.  Fick cardiac output was 3.4 and cardiac index 2.1.   Echo in March 2012:  EF 50-55%. Grade 2 diastolic dysfunction. IVS 1.4cm.  Echo 4/14: EF 40-45%  ECHO 09/03/13: EF 40-45% Echo 4/16: EF 45-50%  Was seen by EP and noted to have a high percentage of RV pacing (99%) and it was felt this was worsening LV function. Underwent upgrade to [facility]. Jude CRT-P in 5/14.  Echo in 4/16 showed EF 45-50% with mild LVH.   She returns for follow up. Overall feels great. Walking 1 hour a day at 430am. Speed 1.8 mph + full incline. Weight at home 125-130 pounds. Taking all medications.  Does not eat any salt.  Denies SOB/PND/Orthopnea. Not smoking using nicotine patch. Continues to work full time. Takes lasix 40 bid.(previously cue to 40 daily due to low BP but fluid came back so takes 40 bid). Says she feels better when her BP is low. Never gets dizzy.   Labs (5/14): K 4, creatinine 0.88 Labs 01/13/13 K 4.3 Creatinine 0.81  Labs 05/29/13 K 4.4 Creatinine 1.0 12/12/2014: K 4.5 Creatinine 1.23   SH: Nonsmoker, works at [employer], lives in [place].   ROS: All systems negative except as listed in HPI, PMH and  Problem List.  Past Medical History  Diagnosis Date  . Hypertrophic obstructive cardiomyopathy   . Hypokalemia   . Chronic systolic CHF (congestive heart failure) ([place])     a. EF 30-35% dx in 2011 -> most recent 40-45% by echo 04/2012. b. s/p upgrade to BiV-PPM 05/08/12. c. Med titration limited by hypotension.  . Bradycardia     a. initial PPM placed 1994 at [facility] with gen change 2002. b. 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 Jude BiV PPM 05/08/12.  . Pneumonia 2008  . Cluster headache syndrome   . Hypotension   . Pacemaker     Current Outpatient Prescriptions  Medication Sig Dispense Refill  . albuterol (PROVENTIL HFA;VENTOLIN HFA) 108 (90 BASE) MCG/ACT inhaler Inhale 1-2 puffs into the lungs every 6 (six) hours as needed for wheezing or shortness of breath. 1 Inhaler 0  . ALPRAZolam (XANAX) 0.25 MG tablet Take 1 tablet (0.25 mg total) by mouth 2 (two) times daily as needed for anxiety. 30 tablet 0  . AMBULATORY NON FORMULARY MEDICATION Medication Name: 100% O2 15 leters a min for 15-20 mins  Via Nasal cannual 1 Bottle 2  . aspirin EC 81 MG EC tablet Take 1 tablet (81 mg total) by mouth daily. 30 tablet 1  .  budesonide-formoterol (SYMBICORT) 160-4.5 MCG/ACT inhaler Inhale 2 puffs into the lungs 2 (two) times daily. 1 Inhaler 3  . carvedilol (COREG) 12.5 MG tablet Take 12.5 mg by mouth 2 (two) times daily with a meal.   12  . furosemide (LASIX) 80 MG tablet TAKE 1/2 TABLET (40 MG TOTAL) BY MOUTH 2 (TWO) TIMES DAILY. 30 tablet 3  . guaiFENesin (MUCINEX) 600 MG 12 hr tablet Take 1,200 mg by mouth 2 (two) times daily as needed for cough.    [place] [facility] ipratropium-albuterol (DUONEB) 0.5-2.5 (3) MG/3ML SOLN USE 1 VIAL (3ML) BY NEBULIZER EVERY 4 TO 6 HOURS AS NEEDED 90 mL 0  . lisinopril (PRINIVIL,ZESTRIL) 10 MG tablet TAKE 1 TABLET BY MOUTH DAILY. 90 tablet 1  . nicotine (NICODERM CQ - DOSED IN MG/24 HOURS) 14 mg/24hr patch Place 1 patch (14 mg total) onto the skin daily. 28 patch  0  . promethazine-dextromethorphan (PROMETHAZINE-DM) 6.25-15 MG/5ML syrup Take 5 mLs by mouth 4 (four) times daily as needed. 240 mL 0  . REA LO 40 40 % CREA Apply 1 application topically daily as needed. Dry skin  0  . spironolactone (ALDACTONE) 25 MG tablet TAKE 1/2 TABLET BY MOUTH DAILY 15 tablet 6  . SUMAtriptan (IMITREX) 50 MG tablet Take 1 tablet at onset of headache. May repeat in 2 hours if headache persists or recurs. 10 tablet 6  . topiramate (TOPAMAX) 50 MG tablet Take 50 mg by mouth daily.     No current facility-administered medications for this encounter.     PHYSICAL EXAM: Filed Vitals:   03/04/15 1354  BP: 92/0  Pulse: 84  Weight: 129 lb 6.4 oz (58.695 kg)  SpO2: 93%    General: NAD  HEENT: normal Neck: supple. JVP flat. Carotids 2+ bilaterally; no bruits. No lymphadenopathy or thryomegaly appreciated. Cor: PMI normal. Regular rate & rhythm. No rubs, gallops or murmurs. PPM site looks good. L upper chest CRT-D  Lungs: decreased throughout but clear.  Abdomen: soft, nontender, nondistended. No hepatosplenomegaly. No bruits or masses. Good bowel sounds. Extremities: no cyanosis, clubbing, rash, edema Neuro: alert & orientedx3, cranial nerves grossly intact. Moves all 4 extremities w/o difficulty. Affect pleasant.   ASSESSMENT & PLAN: 1. Chronic systolic CHF: EF [id number] s/p CRT-P upgrade 05/2012 (due to chronic RV pacing)  NYHA class I symptoms. Volume status stable or perhaps even a bit dry. Continue Coreg, lisinopril, and spironolactone at current doses. --BP is soft. I suspect she is running herself a bit dry but she insists that she feels better this way. Will check BMET today and if BUN/Cr running up can change lasix to 40 bid alternating with 40 daily 2. H/o HCM  3. HTN- today BP low today as above. 4. Tobacco use quit 2012  Follow up in 3 months.     Glori Bickers MD 03/04/2015

## 2015-03-04 NOTE — Patient Instructions (Signed)
Labs today  Let us know if your BP continues to run low and we can decrease your Lisinopril  We will contact you in 6 months to schedule your next appointment.

## 2015-03-04 NOTE — Progress Notes (Signed)
Advanced Heart Failure Medication Review by a Pharmacist  Does the patient  feel that his/her medications are working for him/her?  yes  Has the patient been experiencing any side effects to the medications prescribed?  no  Does the patient measure his/her own blood pressure or blood glucose at home?  no   Does the patient have any problems obtaining medications due to transportation or finances?   no  Understanding of regimen: good Understanding of indications: good Potential of compliance: good Patient understands to avoid NSAIDs. Patient understands to avoid decongestants.  Issues to address at subsequent visits: None   Pharmacist comments:  Ms. Hasser is a pleasant 65 yo F presenting without a medication list but with great recall of her regimen including dosages. She reports excellent compliance with her regimen and did not have any specific medication-related questions or concerns for me at this time.   Ruta Hinds. Velva Harman, PharmD, BCPS, CPP Clinical Pharmacist Pager: [phone] Phone: [phone] 03/04/2015 2:06 PM      Time with patient: 8 minutes Preparation and documentation time: 2 minutes Total time: 10 minutes

## 2015-03-07 ENCOUNTER — Telehealth (HOSPITAL_COMMUNITY): Payer: Self-pay | Admitting: *Deleted

## 2015-03-07 DIAGNOSIS — I5032 Chronic diastolic (congestive) heart failure: Secondary | ICD-10-CM

## 2015-03-07 MED ORDER — FUROSEMIDE 80 MG PO TABS: ORAL_TABLET | ORAL | Status: DC

## 2015-03-07 MED ORDER — LISINOPRIL 10 MG PO TABS: 5.0000 mg | ORAL_TABLET | Freq: Every day | ORAL | Status: DC

## 2015-03-07 NOTE — Telephone Encounter (Signed)
-----   Message from Jolaine Artist, MD sent at 03/04/2015  5:58 PM EST ----- She is too dry. Cut lasix back to 40 bid alternating with 40 daily. Repeat BMET 2 weeks.

## 2015-03-07 NOTE — Addendum Note (Signed)
Addended by: Scarlette Calico on: 03/07/2015 05:48 PM   Modules accepted: Orders

## 2015-03-07 NOTE — Telephone Encounter (Signed)
Notes Recorded by Scarlette Calico, RN on 03/07/2015 at 5:27 PM Pt aware and agreeable, she also states she decreased her Lis since her BP has continued to be low and it was up to 100 today, she is off work 3/7 and request to have labs done then, order placed for [facility].

## 2015-03-09 MED FILL — SPIRONOLACTONE 25 MG TABLET: 25 | 90 days supply | Qty: 45 | Fill #1

## 2015-03-15 ENCOUNTER — Ambulatory Visit (INDEPENDENT_AMBULATORY_CARE_PROVIDER_SITE_OTHER): Payer: 59 | Admitting: Emergency Medicine

## 2015-03-15 ENCOUNTER — Encounter: Payer: Self-pay | Admitting: Emergency Medicine

## 2015-03-15 ENCOUNTER — Other Ambulatory Visit (INDEPENDENT_AMBULATORY_CARE_PROVIDER_SITE_OTHER): Payer: 59 | Admitting: *Deleted

## 2015-03-15 VITALS — BP 108/68 | HR 64 | Ht 62.0 in | Wt 129.0 lb

## 2015-03-15 DIAGNOSIS — J309 Allergic rhinitis, unspecified: Secondary | ICD-10-CM

## 2015-03-15 DIAGNOSIS — I10 Essential (primary) hypertension: Secondary | ICD-10-CM

## 2015-03-15 DIAGNOSIS — J449 Chronic obstructive pulmonary disease, unspecified: Secondary | ICD-10-CM

## 2015-03-15 LAB — BASIC METABOLIC PANEL
BUN: 37 mg/dL — AB (ref 7–25)
CHLORIDE: 104 mmol/L (ref 98–110)
CO2: 25 mmol/L (ref 20–31)
CREATININE: 1.3 mg/dL — AB (ref 0.50–0.99)
Calcium: 9 mg/dL (ref 8.6–10.4)
Glucose, Bld: 108 mg/dL — ABNORMAL HIGH (ref 65–99)
Potassium: 4.5 mmol/L (ref 3.5–5.3)
Sodium: 136 mmol/L (ref 135–146)

## 2015-03-15 MED ORDER — FLUTICASONE PROPIONATE 50 MCG/ACT NA SUSP: 2.0000 | Freq: Every day | NASAL | Status: DC

## 2015-03-15 MED ORDER — LORATADINE 10 MG PO TABS: 10.0000 mg | ORAL_TABLET | Freq: Every day | ORAL | Status: DC

## 2015-03-15 MED ORDER — AEROCHAMBER MV MISC: Status: AC

## 2015-03-15 MED ORDER — VALSARTAN 40 MG PO TABS: 40.0000 mg | ORAL_TABLET | Freq: Every day | ORAL | Status: DC

## 2015-03-15 MED FILL — PULMICORT 180 MCG FLEXHALER: 180 | 30 days supply | Qty: 1 | Fill #0

## 2015-03-15 MED FILL — VALSARTAN 40 MG TABLET: 40 | 30 days supply | Qty: 30 | Fill #0

## 2015-03-15 MED FILL — MICROCHAMBER: 1 days supply | Qty: 1 | Fill #0

## 2015-03-15 MED FILL — FLUTICASONE PROP 50 MCG SPR: 50 | 30 days supply | Qty: 16 | Fill #0

## 2015-03-15 MED FILL — LORATADINE 10 MG TABLET: 10 | 100 days supply | Qty: 100 | Fill #0

## 2015-03-15 MED FILL — AMITRIPTYLINE HCL 10 MG TAB: 10 | 30 days supply | Qty: 30 | Fill #1

## 2015-03-15 NOTE — Assessment & Plan Note (Signed)
With moderate obstruction on spirometry from 2012. We did not have her on scheduled bronchodilator therapy but she now is having more persistent symptoms. I believe she needs to be on a scheduled bronchodilator. She has used Symbicort before and we will start this on a schedule with a spacer. We'll also try to treat her allergic rhinitis more aggressively. Finally I suspect that at least one component of her upper airway irritation and chronic cough (which make her COPD harder to manage) is her ACE inhibitor. I will empirically change her to valsartan and send a message to the CHF clinic.

## 2015-03-15 NOTE — Progress Notes (Signed)
Subjective:    Patient ID: Angela Shepherd, female    DOB: 11-13-1950, 65 y.o.   MRN: [id number]  HPI 65 year old woman with a history of tobacco use but I have seen in the past for obstructive lung disease. 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 I last saw her in September 2012. I have reviewed her primary function testing from that time that shows moderate obstruction without bronchodilator response, hyperinflation, and decreased diffusion capacity that corrects for alveolar volume. She has decreased her wt about 30 lbs since last time. She is referred back for recurrent episodes of cough, dyspnea, wheeze starting in October '16. She has also had continue rhinorrhea. She was given symbicort about a year ago, used it prn, still does so periodically. She was treated a few times with antibiotics, nebulized BD by Dr Birdie Riddle. Has also been seen in the ED. She hasn't had pred for over a year when she had volume overload that may have been exacerbated by pred.  She continues to have nasal gtt, dry cough. She now has albuterol / atrovent nebs for home use > uses this for days straight when her dyspnea, wheeze are worse, happens about once a month.    Review of Systems  Constitutional: Negative.  Negative for fever and unexpected weight change.  HENT: Positive for rhinorrhea. Negative for congestion, dental problem, ear pain, nosebleeds, postnasal drip, sinus pressure, sneezing, sore throat and trouble swallowing.   Eyes: Negative.  Negative for redness and itching.  Respiratory: Negative.  Negative for cough, chest tightness, shortness of breath and wheezing.   Cardiovascular: Negative.  Negative for palpitations and leg swelling.  Gastrointestinal: Negative.  Negative for nausea and vomiting.  Endocrine: Negative.   Genitourinary: Negative.  Negative  for dysuria.  Musculoskeletal: Negative.  Negative for joint swelling.  Skin: Negative.  Negative for rash.  Allergic/Immunologic: Negative.   Neurological: Positive for headaches.  Hematological: Negative.  Does not bruise/bleed easily.  Psychiatric/Behavioral: Negative.  Negative for dysphoric mood. The patient is not nervous/anxious.    Past Medical History  Diagnosis Date  . Hypertrophic obstructive cardiomyopathy   . Hypokalemia   . Chronic systolic CHF (congestive heart failure) ([place])     a. EF 30-35% dx in 2011 -> most recent 40-45% by echo 04/2012. b. s/p upgrade to BiV-PPM 05/08/12. c. Med titration limited by hypotension.  . Bradycardia     a. initial PPM placed [facility] at [facility] with gen change 2002. b. 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 Jude BiV PPM 05/08/12.  . Pneumonia 2008  . Cluster headache syndrome   . Hypotension   . Pacemaker      Family History  Problem Relation Age of Onset  . Diabetes    . Cardiomyopathy Sister   . Heart disease Sister   . Colon cancer Neg Hx   . Esophageal cancer Neg Hx   . Stomach cancer Neg Hx   . Rectal cancer Neg Hx   . Cancer Sister 37    uterine  . Dementia Mother   . Diabetes Brother      Social History   Social History  . Marital Status: Married    Spouse Name: N/A  . Number of Children: 2  . Years of Education: N/A   Occupational History  . [employer]  History Main Topics  . Smoking status: Former Smoker -- 3.00 packs/day  . Smokeless tobacco: Never Used  . Alcohol Use: 1.2 oz/week    2 Glasses of wine per week  . Drug Use: No  . Sexual Activity: No   Other Topics Concern  . Not on file   Social History Narrative   ** Merged History Encounter **         Allergies  Allergen Reactions  . Prednisone Other (See Comments)    SOB, CHF      Outpatient Prescriptions Prior to Visit  Medication Sig Dispense Refill  . albuterol (PROVENTIL HFA;VENTOLIN HFA) 108  (90 BASE) MCG/ACT inhaler Inhale 1-2 puffs into the lungs every 6 (six) hours as needed for wheezing or shortness of breath. 1 Inhaler 0  . ALPRAZolam (XANAX) 0.25 MG tablet Take 1 tablet (0.25 mg total) by mouth 2 (two) times daily as needed for anxiety. 30 tablet 0  . AMBULATORY NON FORMULARY MEDICATION Medication Name: 100% O2 15 leters a min for 15-20 mins  Via Nasal cannual 1 Bottle 2  . aspirin EC 81 MG EC tablet Take 1 tablet (81 mg total) by mouth daily. 30 tablet 1  . budesonide-formoterol (SYMBICORT) 160-4.5 MCG/ACT inhaler Inhale 2 puffs into the lungs 2 (two) times daily. 1 Inhaler 3  . carvedilol (COREG) 12.5 MG tablet Take 12.5 mg by mouth 2 (two) times daily with a meal.   12  . furosemide (LASIX) 80 MG tablet Take 1/2 tab Twice daily every other day ALTERNATING with 1/2 tab daily every other day 30 tablet 3  . guaiFENesin (MUCINEX) 600 MG 12 hr tablet Take 1,200 mg by mouth 2 (two) times daily as needed for cough.    Marland Kitchen ipratropium-albuterol (DUONEB) 0.5-2.5 (3) MG/3ML SOLN USE 1 VIAL (3ML) BY NEBULIZER EVERY 4 TO 6 HOURS AS NEEDED 90 mL 0  . lisinopril (PRINIVIL,ZESTRIL) 10 MG tablet Take 0.5 tablets (5 mg total) by mouth daily. 90 tablet 1  . nicotine (NICODERM CQ - DOSED IN MG/24 HOURS) 14 mg/24hr patch Place 1 patch (14 mg total) onto the skin daily. 28 patch 0  . REA LO 40 40 % CREA Apply 1 application topically daily as needed. Dry skin  0  . spironolactone (ALDACTONE) 25 MG tablet TAKE 1/2 TABLET BY MOUTH DAILY 15 tablet 6  . SUMAtriptan (IMITREX) 50 MG tablet Take 1 tablet at onset of headache. May repeat in 2 hours if headache persists or recurs. 10 tablet 6  . topiramate (TOPAMAX) 50 MG tablet Take 50 mg by mouth daily.    . promethazine-dextromethorphan (PROMETHAZINE-DM) 6.25-15 MG/5ML syrup Take 5 mLs by mouth 4 (four) times daily as needed. (Patient not taking: Reported on 03/15/2015) 240 mL 0   No facility-administered medications prior to visit.         Objective:     Physical Exam Filed Vitals:   03/15/15 1427  BP: 108/68  Pulse: 64  Height: 5\' 2"  (1.575 m)  Weight: 129 lb (58.514 kg)  SpO2: 97%   Gen: Pleasant, well-nourished, in no distress,  normal affect  ENT: No lesions,  mouth clear,  oropharynx clear, some nasal congestion  Neck: No JVD, no stridor  Lungs: No use of accessory muscles, no dullness to percussion, clear without rales or rhonchi  Cardiovascular: RRR, heart sounds normal, no murmur or gallops, no peripheral edema  Abdomen: soft and NT, no HSM,  BS normal  Musculoskeletal: No deformities, no cyanosis or clubbing  Neuro:  alert, non focal  Skin: Warm, no lesions or rashes      Assessment & Plan:  COPD (chronic obstructive pulmonary disease) With moderate obstruction on spirometry from 2012. We did not have her on scheduled bronchodilator therapy but she now is having more persistent symptoms. I believe she needs to be on a scheduled bronchodilator. She has used Symbicort before and we will start this on a schedule with a spacer. We'll also try to treat her allergic rhinitis more aggressively. Finally I suspect that at least one component of her upper airway irritation and chronic cough (which make her COPD harder to manage) is her ACE inhibitor. I will empirically change her to valsartan and send a message to the CHF clinic.   Chronic allergic rhinitis And loratadine 10 mg, and fluticasone spray once a day   Baltazar Apo, MD, PhD 03/15/2015, 5:33 PM [facility] [phone] or if no answer [phone]

## 2015-03-15 NOTE — Patient Instructions (Signed)
We will stop lisinopril and start valsartan We will start Symbicort 2 puffs twice a day every day through a spacer.  Have your Duonebs or your albuterol inhaler available to use as needed for shortness of breath.  Start loratadine 10mg  daily Start fluticasone nasal spray, 2 sprays each nostril daily We will decide when to repeat your breathing testing in the future.  Follow with Dr Lamonte Sakai next available

## 2015-03-15 NOTE — Assessment & Plan Note (Signed)
And loratadine 10 mg, and fluticasone spray once a day

## 2015-03-18 MED FILL — CARVEDILOL 12.5 MG TABLET: 12.5 | 30 days supply | Qty: 60 | Fill #9

## 2015-03-22 ENCOUNTER — Telehealth: Payer: Self-pay | Admitting: *Deleted

## 2015-03-22 LAB — CUP PACEART REMOTE DEVICE CHECK
Battery Remaining Percentage: 95.5 %
Brady Statistic AP VS Percent: 1 %
Brady Statistic AS VP Percent: 67 %
Date Time Interrogation Session: 20170216091751
Implantable Lead Implant Date: 19940705
Implantable Lead Implant Date: 20140501
Implantable Lead Location: 753858
Implantable Lead Location: 753859
Implantable Lead Location: 753860
Implantable Lead Model: 4524
Lead Channel Impedance Value: 380 Ohm
Lead Channel Pacing Threshold Pulse Width: 0.4 ms
Lead Channel Setting Pacing Amplitude: 2.125
Lead Channel Setting Sensing Sensitivity: 2 mV
MDC IDC LEAD IMPLANT DT: 19940705
MDC IDC LEAD MODEL: 4024
MDC IDC MSMT BATTERY REMAINING LONGEVITY: 83 mo
MDC IDC MSMT BATTERY VOLTAGE: 2.96 V
MDC IDC MSMT LEADCHNL LV IMPEDANCE VALUE: 740 Ohm
MDC IDC MSMT LEADCHNL RA IMPEDANCE VALUE: 460 Ohm
MDC IDC MSMT LEADCHNL RA SENSING INTR AMPL: 2.1 mV
MDC IDC MSMT LEADCHNL RV PACING THRESHOLD AMPLITUDE: 1.125 V
MDC IDC MSMT LEADCHNL RV SENSING INTR AMPL: 6.7 mV
MDC IDC SET LEADCHNL LV PACING AMPLITUDE: 2.5 V
MDC IDC SET LEADCHNL LV PACING PULSEWIDTH: 1 ms
MDC IDC SET LEADCHNL RA PACING AMPLITUDE: 2 V
MDC IDC SET LEADCHNL RV PACING PULSEWIDTH: 0.4 ms
MDC IDC STAT BRADY AP VP PERCENT: 33 %
MDC IDC STAT BRADY AS VS PERCENT: 1 %
MDC IDC STAT BRADY RA PERCENT PACED: 33 %
Pulse Gen Model: 3210
Pulse Gen Serial Number: 2936617

## 2015-03-22 NOTE — Telephone Encounter (Signed)
Received request for Medical Records; forwarded to [place] for email/scan//SLS 03/14

## 2015-03-23 ENCOUNTER — Encounter: Payer: Self-pay | Admitting: Cardiology

## 2015-03-23 ENCOUNTER — Telehealth: Payer: Self-pay | Admitting: Family Medicine

## 2015-03-23 NOTE — Telephone Encounter (Signed)
Radiology called to request pt's chest imaging report. Faxed to # [phone]

## 2015-03-31 MED FILL — FUROSEMIDE 80 MG TABLET: 80 | 30 days supply | Qty: 30 | Fill #2

## 2015-04-18 ENCOUNTER — Other Ambulatory Visit: Payer: Self-pay | Admitting: Family Medicine

## 2015-04-18 ENCOUNTER — Other Ambulatory Visit (HOSPITAL_COMMUNITY): Payer: Self-pay | Admitting: Internal Medicine

## 2015-04-18 MED FILL — CYCLOBENZAPRINE 5 MG TABLET: 5 | 10 days supply | Qty: 30 | Fill #0

## 2015-04-18 MED FILL — CARVEDILOL 12.5 MG TABLET: 12.5 | 30 days supply | Qty: 60 | Fill #0

## 2015-04-18 MED FILL — VALSARTAN 40 MG TABLET: 40 | 30 days supply | Qty: 30 | Fill #1

## 2015-04-18 NOTE — Telephone Encounter (Signed)
Medication filled to pharmacy as requested.   

## 2015-04-19 ENCOUNTER — Telehealth: Payer: Self-pay | Admitting: General Practice

## 2015-04-19 MED ORDER — ALPRAZOLAM 0.25 MG PO TABS: 0.2500 mg | ORAL_TABLET | Freq: Two times a day (BID) | ORAL | Status: DC | PRN

## 2015-04-19 MED FILL — ALPRAZolam 0.25 MG TABS: 0.25 | 15 days supply | Qty: 30 | Fill #0

## 2015-04-19 NOTE — Telephone Encounter (Signed)
Rx faxed to [facility].

## 2015-04-19 NOTE — Telephone Encounter (Signed)
Rx printed, awaiting MD signature.  

## 2015-04-19 NOTE — Telephone Encounter (Signed)
Last OV 01/20/15 Alprazolam last filled 02/14/15 #30 with 0  Needs a CSC and UDS

## 2015-04-19 NOTE — Telephone Encounter (Signed)
Angela Shepherd, will send a prescription [name],  print and fax a prescription for Xanax No. 30 no refills

## 2015-04-22 ENCOUNTER — Encounter: Payer: 59 | Admitting: Family Medicine

## 2015-04-25 MED FILL — LISINOPRIL 10 MG TABLET: 10 | 90 days supply | Qty: 90 | Fill #1

## 2015-05-02 ENCOUNTER — Encounter: Payer: 59 | Admitting: Family Medicine

## 2015-05-06 ENCOUNTER — Encounter: Payer: Self-pay | Admitting: Neurology

## 2015-05-06 ENCOUNTER — Other Ambulatory Visit (INDEPENDENT_AMBULATORY_CARE_PROVIDER_SITE_OTHER): Payer: 59

## 2015-05-06 ENCOUNTER — Ambulatory Visit (INDEPENDENT_AMBULATORY_CARE_PROVIDER_SITE_OTHER): Payer: 59 | Admitting: Neurology

## 2015-05-06 VITALS — BP 100/70 | HR 79 | Wt 134.0 lb

## 2015-05-06 DIAGNOSIS — G44019 Episodic cluster headache, not intractable: Secondary | ICD-10-CM

## 2015-05-06 LAB — BASIC METABOLIC PANEL
BUN: 26 mg/dL — ABNORMAL HIGH (ref 6–23)
CALCIUM: 9.6 mg/dL (ref 8.4–10.5)
CHLORIDE: 103 meq/L (ref 96–112)
CO2: 25 meq/L (ref 19–32)
Creatinine, Ser: 1.24 mg/dL — ABNORMAL HIGH (ref 0.40–1.20)
GFR: 46.36 mL/min — ABNORMAL LOW (ref >60.00)
Glucose, Bld: 93 mg/dL (ref 70–99)
Potassium: 3.7 mEq/L (ref 3.5–5.1)
SODIUM: 136 meq/L (ref 135–145)

## 2015-05-06 NOTE — Progress Notes (Signed)
NEUROLOGY FOLLOW UP OFFICE NOTE  Angela Shepherd [id number]  HISTORY OF PRESENT ILLNESS: Angela Shepherd is a 65 year old right-handed woman with systolic CHF, hypertension who follows up for cluster headache.  UPDATE: She is doing well.  She takes topiramate 50mg , but she is also still taking amitriptyline 10mg .  She has not had a headache in one year.  Recently, she thought she was about to have one, but used the O2 and it never progressed.  In January, her renal function declined.  BUN was 32 and Cr 1.59.  In February, BUN was 50 and Cr 1.61.  It was thought to be due to her Lasix, which was decreased.  It has since improved.  BM from 03/15/15 showed BUN 37 and Cr 1.30.  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  Rarely, nausea. Initial Duration:  1 hour at a time, occuring once to 3 times back to back. Initial Frequency:  Episodic, usually during months of April, May or October.  Occurs daily between 1 to 4 weeks. Triggers/exacerbating factors:  none Relieving factors:  none Activity:  Cannot function  Past abortive medication:  Prednisone taper (previously effective but cannot take due to CHF), Tylenol Migraine, sumatriptan 50mg  Past preventative medication:  amitriptyline Other past therapy:  none  Caffeine:  1 cup of coffee and 1 cup of tea daily Alcohol:  no Smoker:  former Diet:  Healthy.  On fluid restriction Exercise:  Walks on treadmill daily Depression/stress:  Fairly stable.  Husband passed away last year.  Primary caregiver now for her elderly mother. Sleep hygiene:  poor Family history of headache:  Paternal grandfather (said he had "lightening bolts in my head"), mom Works in Audiological scientist.  PAST MEDICAL HISTORY: Past Medical History  Diagnosis  Date  . Hypertrophic obstructive cardiomyopathy   . Hypokalemia   . Chronic systolic CHF (congestive heart failure) ([place])     a. EF 30-35% dx in 2011 -> most recent 40-45% by echo 04/2012. b. s/p upgrade to BiV-PPM 05/08/12. c. Med titration limited by hypotension.  . Bradycardia     a. initial PPM [facility] at [facility] with gen change 2002. b. 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 Jude BiV PPM 05/08/12.  . Pneumonia 2008  . Cluster headache syndrome   . Hypotension   . Pacemaker     MEDICATIONS: Current Outpatient Prescriptions on File Prior to Visit  Medication Sig Dispense Refill  . albuterol (PROVENTIL HFA;VENTOLIN HFA) 108 (90 BASE) MCG/ACT inhaler Inhale 1-2 puffs into the lungs every 6 (six) hours as needed for wheezing or shortness of breath. 1 Inhaler 0  . ALPRAZolam (XANAX) 0.25 MG tablet Take 1 tablet (0.25 mg total) by mouth 2 (two) times daily as needed for anxiety. 30 tablet 0  . AMBULATORY NON FORMULARY MEDICATION Medication Name: 100% O2 15 leters a min for 15-20 mins  Via Nasal cannual 1 Bottle 2  . aspirin EC 81 MG EC tablet Take 1 tablet (81 mg total) by mouth daily. 30 tablet 1  . budesonide-formoterol (SYMBICORT) 160-4.5 MCG/ACT inhaler Inhale 2 puffs into the lungs 2 (two) times daily. 1 Inhaler 3  . carvedilol (COREG) 12.5 MG tablet Take 12.5 mg by mouth 2 (two) times daily with a meal.   12  .  carvedilol (COREG) 12.5 MG tablet TAKE 1 TABLET (12.5 MG TOTAL) BY MOUTH 2 (TWO) TIMES DAILY WITH A MEAL. 60 tablet 12  . cyclobenzaprine (FLEXERIL) 5 MG tablet TAKE 1 TABLET (5 MG TOTAL) BY MOUTH 3 (THREE) TIMES DAILY AS NEEDED FOR MUSCLE SPASMS. 30 tablet 2  . fluticasone (FLONASE) 50 MCG/ACT nasal spray Place 2 sprays into both nostrils daily. 16 g 5  . furosemide (LASIX) 80 MG tablet Take 1/2 tab Twice daily every other day ALTERNATING with 1/2 tab daily every other day 30 tablet 3  . guaiFENesin (MUCINEX) 600 MG 12 hr tablet Take 1,200 mg by mouth 2  (two) times daily as needed for cough.    Marland Kitchen ipratropium-albuterol (DUONEB) 0.5-2.5 (3) MG/3ML SOLN USE 1 VIAL (3ML) BY NEBULIZER EVERY 4 TO 6 HOURS AS NEEDED 90 mL 0  . lisinopril (PRINIVIL,ZESTRIL) 10 MG tablet Take 0.5 tablets (5 mg total) by mouth daily. 90 tablet 1  . loratadine (CLARITIN) 10 MG tablet Take 1 tablet (10 mg total) by mouth daily. 30 tablet 5  . nicotine (NICODERM CQ - DOSED IN MG/24 HOURS) 14 mg/24hr patch Place 1 patch (14 mg total) onto the skin daily. 28 patch 0  . REA LO 40 40 % CREA Apply 1 application topically daily as needed. Dry skin  0  . Spacer/Aero-Holding Chambers (AEROCHAMBER MV) inhaler Use as instructed with Symbicort 1 each 0  . spironolactone (ALDACTONE) 25 MG tablet TAKE 1/2 TABLET BY MOUTH DAILY 15 tablet 6  . SUMAtriptan (IMITREX) 50 MG tablet Take 1 tablet at onset of headache. May repeat in 2 hours if headache persists or recurs. 10 tablet 6  . topiramate (TOPAMAX) 50 MG tablet Take 50 mg by mouth daily.    . valsartan (DIOVAN) 40 MG tablet Take 1 tablet (40 mg total) by mouth daily. 30 tablet 5   No current facility-administered medications on file prior to visit.    ALLERGIES: Allergies  Allergen Reactions  . Prednisone Other (See Comments)    SOB, CHF     FAMILY HISTORY: Family History  Problem Relation Age of Onset  . Diabetes    . Cardiomyopathy Sister   . Heart disease Sister   . Colon cancer Neg Hx   . Esophageal cancer Neg Hx   . Stomach cancer Neg Hx   . Rectal cancer Neg Hx   . Cancer Sister 24    uterine  . Dementia Mother   . Diabetes Brother     SOCIAL HISTORY: Social History   Social History  . Marital Status: Married    Spouse Name: N/A  . Number of Children: 2  . Years of Education: N/A   Occupational History  . [place]    Social History Main Topics  . Smoking status: Former Smoker -- 3.00 packs/day  . Smokeless tobacco: Never Used  . Alcohol Use: 1.2 oz/week    2 Glasses of wine per  week  . Drug Use: No  . Sexual Activity: No   Other Topics Concern  . Not on file   Social History Narrative   ** Merged History Encounter **        REVIEW OF SYSTEMS: Constitutional: No fevers, chills, or sweats, no generalized fatigue, change in appetite Eyes: No visual changes, double vision, eye pain Ear, nose and throat: No hearing loss, ear pain, nasal congestion, sore throat Cardiovascular: No chest pain, palpitations Respiratory:  No shortness of breath at rest or with exertion, wheezes GastrointestinaI:  No nausea, vomiting, diarrhea, abdominal pain, fecal incontinence Genitourinary:  No dysuria, urinary retention or frequency Musculoskeletal:  No neck pain, back pain Integumentary: No rash, pruritus, skin lesions Neurological: as above Psychiatric: No depression, insomnia, anxiety Endocrine: No palpitations, fatigue, diaphoresis, mood swings, change in appetite, change in weight, increased thirst Hematologic/Lymphatic:  No anemia, purpura, petechiae. Allergic/Immunologic: no itchy/runny eyes, nasal congestion, recent allergic reactions, rashes  PHYSICAL EXAM: Filed Vitals:   05/06/15 1500  BP: 100/70  Pulse: 79   General: No acute distress.  Patient appears well-groomed.  normal body habitus. Head:  Normocephalic/atraumatic Eyes:  Fundi examined but not visualized Neck: supple, no paraspinal tenderness, full range of motion Heart:  Regular rate and rhythm Lungs:  Clear to auscultation bilaterally Back: No paraspinal tenderness Neurological Exam: alert and oriented to person, place, and time. Attention span and concentration intact, recent and remote memory intact, fund of knowledge intact.  Speech fluent and not dysarthric, language intact.  CN II-XII intact. Bulk and tone normal, muscle strength 5/5 throughout.  Sensation to light touch, temperature and vibration intact.  Deep tendon reflexes 2+ throughout.  Finger to nose and heel to shin testing intact.  Gait  normal  IMPRESSION: Episodic cluster headache, stable  PLAN: 1.  Continue topiramate 50mg  at bedtime.  We discussed whether she needs to remain on amitriptyline.  I suggested that we can stop it and see how she is doing, but since she is doing well, she would like to remain on it. 2.  O2 as needed. 3.  Will check a repeat BMP to make sure she is doing well (although she is on low dose topiramate, topiramate may affect the kidneys as well). 4.  Follow up in 9 months.  15 minutes spent face to face with patient, over 50% spent discussing management.  Metta Clines, DO  CC:  Annye Asa, MD

## 2015-05-06 NOTE — Patient Instructions (Signed)
1.  Continue topiramate 50mg  at bedtime.  Continue amitriptyline for now. 2.  Use the oxygen if needed 3.  Will check BMP 4.  Follow up in 9 months.

## 2015-05-09 ENCOUNTER — Telehealth: Payer: Self-pay

## 2015-05-09 NOTE — Telephone Encounter (Signed)
-----   Message from Pieter Partridge, DO sent at 05/08/2015 12:37 PM EDT ----- Kidney function continues to improve

## 2015-05-09 NOTE — Telephone Encounter (Signed)
Results sent via my chart 

## 2015-05-10 MED FILL — FUROSEMIDE 80 MG TABLET: 80 | 30 days supply | Qty: 30 | Fill #3

## 2015-05-11 MED FILL — FLUTICASONE PROP 50 MCG SPR: 50 | 30 days supply | Qty: 16 | Fill #1

## 2015-05-17 MED FILL — AMITRIPTYLINE HCL 10 MG TAB: 10 | 30 days supply | Qty: 30 | Fill #2

## 2015-05-17 MED FILL — CARVEDILOL 12.5 MG TABLET: 12.5 | 30 days supply | Qty: 60 | Fill #1

## 2015-05-26 ENCOUNTER — Encounter (HOSPITAL_COMMUNITY): Payer: Self-pay

## 2015-05-26 NOTE — Progress Notes (Signed)
[employer] FMLA forms completed and faxed to provided # [phone] Copy of [employer] flyer sent with forms for any future forms/medical record requests to be sent directly to [employer] office. Copy of forms scanned into patient's electronic medical record.  Renee Pain

## 2015-06-02 ENCOUNTER — Encounter: Payer: Self-pay | Admitting: Family Medicine

## 2015-06-02 ENCOUNTER — Ambulatory Visit (INDEPENDENT_AMBULATORY_CARE_PROVIDER_SITE_OTHER): Payer: 59 | Admitting: Family Medicine

## 2015-06-02 ENCOUNTER — Other Ambulatory Visit (HOSPITAL_COMMUNITY)
Admission: RE | Admit: 2015-06-02 | Discharge: 2015-06-02 | Disposition: A | Discharge status: Home / Self Care | Payer: 59 | Source: Ambulatory Visit | Attending: Family Medicine | Admitting: Family Medicine

## 2015-06-02 VITALS — BP 110/72 | HR 67 | Temp 98.1°F | Resp 16 | Ht 62.0 in | Wt 130.2 lb

## 2015-06-02 DIAGNOSIS — Z0000 Encounter for general adult medical examination without abnormal findings: Secondary | ICD-10-CM

## 2015-06-02 DIAGNOSIS — Z1231 Encounter for screening mammogram for malignant neoplasm of breast: Secondary | ICD-10-CM | POA: Diagnosis not present

## 2015-06-02 DIAGNOSIS — Z124 Encounter for screening for malignant neoplasm of cervix: Secondary | ICD-10-CM

## 2015-06-02 DIAGNOSIS — R799 Abnormal finding of blood chemistry, unspecified: Secondary | ICD-10-CM

## 2015-06-02 DIAGNOSIS — Z01419 Encounter for gynecological examination (general) (routine) without abnormal findings: Secondary | ICD-10-CM

## 2015-06-02 DIAGNOSIS — Z1151 Encounter for screening for human papillomavirus (HPV): Secondary | ICD-10-CM | POA: Insufficient documentation

## 2015-06-02 DIAGNOSIS — Z0184 Encounter for antibody response examination: Secondary | ICD-10-CM | POA: Diagnosis not present

## 2015-06-02 LAB — BASIC METABOLIC PANEL
BUN: 38 mg/dL — ABNORMAL HIGH (ref 6–23)
CALCIUM: 9.6 mg/dL (ref 8.4–10.5)
CO2: 31 mEq/L (ref 19–32)
CREATININE: 1.32 mg/dL — AB (ref 0.40–1.20)
Chloride: 100 mEq/L (ref 96–112)
GFR: 43.12 mL/min — AB (ref >60.00)
Glucose, Bld: 85 mg/dL (ref 70–99)
Potassium: 3.9 mEq/L (ref 3.5–5.1)
SODIUM: 138 meq/L (ref 135–145)

## 2015-06-02 LAB — CBC WITH DIFFERENTIAL/PLATELET
BASOS ABS: 0 10*3/uL (ref 0.0–0.1)
BASOS PCT: 0.5 % (ref 0.0–3.0)
EOS ABS: 0.2 10*3/uL (ref 0.0–0.7)
Eosinophils Relative: 1.9 % (ref 0.0–5.0)
HEMATOCRIT: 41.1 % (ref 36.0–46.0)
HEMOGLOBIN: 13.9 g/dL (ref 12.0–15.0)
LYMPHS PCT: 25.5 % (ref 12.0–46.0)
Lymphs Abs: 2.1 10*3/uL (ref 0.7–4.0)
MCHC: 33.9 g/dL (ref 30.0–36.0)
MCV: 94.1 fl (ref 78.0–100.0)
MONOS PCT: 5.8 % (ref 3.0–12.0)
Monocytes Absolute: 0.5 10*3/uL (ref 0.1–1.0)
NEUTROS ABS: 5.6 10*3/uL (ref 1.4–7.7)
NEUTROS PCT: 66.3 % (ref 43.0–77.0)
Platelets: 229 10*3/uL (ref 150.0–400.0)
RBC: 4.37 Mil/uL (ref 3.87–5.11)
RDW: 13 % (ref 11.5–15.5)
WBC: 8.4 10*3/uL (ref 4.0–10.5)

## 2015-06-02 LAB — TSH: TSH: 1.39 u[IU]/mL (ref 0.35–4.50)

## 2015-06-02 LAB — HEPATIC FUNCTION PANEL
ALBUMIN: 4.8 g/dL (ref 3.5–5.2)
ALK PHOS: 76 U/L (ref 39–117)
ALT: 17 U/L (ref 0–35)
AST: 19 U/L (ref 0–37)
BILIRUBIN DIRECT: 0.1 mg/dL (ref 0.0–0.3)
TOTAL PROTEIN: 7.2 g/dL (ref 6.0–8.3)
Total Bilirubin: 0.4 mg/dL (ref 0.2–1.2)

## 2015-06-02 LAB — LIPID PANEL
Cholesterol: 233 mg/dL — ABNORMAL HIGH (ref 0–200)
HDL: 51.3 mg/dL (ref >39.00)
NONHDL: 182.07
TRIGLYCERIDES: 220 mg/dL — AB (ref 0.0–149.0)
Total CHOL/HDL Ratio: 5
VLDL: 44 mg/dL — AB (ref 0.0–40.0)

## 2015-06-02 LAB — LDL CHOLESTEROL, DIRECT: LDL DIRECT: 149 mg/dL

## 2015-06-02 LAB — VITAMIN D 25 HYDROXY (VIT D DEFICIENCY, FRACTURES): VITD: 13.62 ng/mL — AB (ref 30.00–100.00)

## 2015-06-02 NOTE — Patient Instructions (Signed)
Follow up in 6 months to recheck BP We'll notify you of your lab results and make any changes if needed Keep up the good work!  You look great!! Call with any questions or concerns Thanks for sticking with [place]! CONGRATS!!!!

## 2015-06-02 NOTE — Assessment & Plan Note (Signed)
Pt's PE WNL.  UTD on immunizations.  Due for mammo- order entered.  Pap collected today.  UTD on colonoscopy.  Check labs.  Anticipatory guidance provided.

## 2015-06-02 NOTE — Progress Notes (Signed)
   Subjective:    Patient ID: Angela Shepherd, female    DOB: Jan 25, 1950, 65 y.o.   MRN: [id number]  HPI CPE- due for mammo (HP) and pap.  Needs titers done for work.  UTD on colonoscopy.  Due for Hep C screening and immunization titers.     Review of Systems Patient reports no vision/ hearing changes, adenopathy,fever, weight change,  persistant/recurrent hoarseness , swallowing issues, chest pain, palpitations, edema, persistant/recurrent cough, hemoptysis, dyspnea (rest/exertional/paroxysmal nocturnal), gastrointestinal bleeding (melena, rectal bleeding), abdominal pain, significant heartburn, bowel changes, GU symptoms (dysuria, hematuria, incontinence), Gyn symptoms (abnormal  bleeding, pain),  syncope, focal weakness, memory loss, numbness & tingling, skin/hair/nail changes, abnormal bruising or bleeding, anxiety, or depression.     Objective:   Physical Exam  General Appearance:    Alert, cooperative, no distress, appears stated age  Head:    Normocephalic, without obvious abnormality, atraumatic  Eyes:    PERRL, conjunctiva/corneas clear, EOM's intact, fundi    benign, both eyes  Ears:    Normal TM's and external ear canals, both ears  Nose:   Nares normal, septum midline, mucosa normal, no drainage    or sinus tenderness  Throat:   Lips, mucosa, and tongue normal; teeth and gums normal  Neck:   Supple, symmetrical, trachea midline, no adenopathy;    Thyroid: no enlargement/tenderness/nodules  Back:     Symmetric, no curvature, ROM normal, no CVA tenderness  Lungs:     Clear to auscultation bilaterally, respirations unlabored  Chest Wall:    No tenderness or deformity   Heart:    Regular rate and rhythm, S1 and S2 normal, no murmur, rub   or gallop  Breast Exam:    No tenderness, masses, or nipple abnormality  Abdomen:     Soft, non-tender, bowel sounds active all four quadrants,    no masses, no organomegaly  Genitalia:    External genitalia normal, cervix normal in  appearance, no CMT, uterus in normal size and position, adnexa w/out mass or tenderness, mucosa pink and moist, no lesions or discharge present  Rectal:    Normal external appearance  Extremities:   Extremities normal, atraumatic, no cyanosis or edema  Pulses:   2+ and symmetric all extremities  Skin:   Skin color, texture, turgor normal, no rashes or lesions  Lymph nodes:   Cervical, supraclavicular, and axillary nodes normal  Neurologic:   CNII-XII intact, normal strength, sensation and reflexes    throughout          Assessment & Plan:

## 2015-06-02 NOTE — Assessment & Plan Note (Signed)
Pap collected. 

## 2015-06-02 NOTE — Progress Notes (Signed)
Pre visit review using our clinic review tool, if applicable. No additional management support is needed unless otherwise documented below in the visit note. 

## 2015-06-03 ENCOUNTER — Other Ambulatory Visit: Payer: Self-pay | Admitting: General Practice

## 2015-06-03 DIAGNOSIS — E785 Hyperlipidemia, unspecified: Secondary | ICD-10-CM

## 2015-06-03 LAB — HEPATITIS B SURFACE ANTIBODY,QUALITATIVE: Hep B S Ab: NEGATIVE

## 2015-06-03 LAB — VARICELLA ZOSTER ABS, IGG/IGM
Varicella IgM: 1.19 index — ABNORMAL HIGH (ref 0.00–0.90)
Varicella zoster IgG: 1748 index (ref >165)

## 2015-06-03 LAB — HEPATITIS C ANTIBODY: HCV AB: NEGATIVE

## 2015-06-03 LAB — MEASLES/MUMPS/RUBELLA IMMUNITY: Rubella: 25.5 Index — ABNORMAL HIGH (ref <0.90)

## 2015-06-03 MED ORDER — ATORVASTATIN CALCIUM 10 MG PO TABS: 10.0000 mg | ORAL_TABLET | Freq: Every day | ORAL | Status: DC

## 2015-06-03 MED ORDER — VITAMIN D (ERGOCALCIFEROL) 1.25 MG (50000 UNIT) PO CAPS: 50000.0000 [IU] | ORAL_CAPSULE | ORAL | Status: DC

## 2015-06-03 MED FILL — ATORVASTATIN 10 MG TABLET: 10 | 90 days supply | Qty: 90 | Fill #0

## 2015-06-03 MED FILL — VIT D2 1.25 MG (50,000 UNIT: 1.25 MG | 12 days supply | Qty: 12 | Fill #0

## 2015-06-07 ENCOUNTER — Encounter (HOSPITAL_COMMUNITY): Payer: Self-pay

## 2015-06-07 ENCOUNTER — Telehealth: Payer: Self-pay | Admitting: Family Medicine

## 2015-06-07 LAB — CYTOLOGY - PAP

## 2015-06-07 MED FILL — SPIRONOLACTONE 25 MG TABLET: 25 | 30 days supply | Qty: 15 | Fill #2

## 2015-06-07 NOTE — Telephone Encounter (Signed)
Appointment Request From: Eston Mould       With Provider: Annye Asa, MD [place] [facility] [place] [place]]      Preferred Date Range: Any date 06/07/2015 or later      Reason for visit: Office Visit      Comments:   Dr.Tabori wants me to get the Hep B series. Is this available at your site? You are closer to me than the [place] office.I am available any day at 4:30.

## 2015-06-07 NOTE — Telephone Encounter (Signed)
Ok to give pt immunization per PCP

## 2015-06-08 ENCOUNTER — Encounter (HOSPITAL_COMMUNITY): Payer: Self-pay

## 2015-06-08 MED FILL — VALSARTAN 40 MG TABLET: 40 | 30 days supply | Qty: 30 | Fill #2

## 2015-06-08 NOTE — Telephone Encounter (Signed)
Noted  

## 2015-06-08 NOTE — Progress Notes (Signed)
Received statement from [employer] regarding patient's FMLA being denied due to tardiness of returning records and forms. Sent formal letter regarding this issue directly to representative Hunt Oris stating this was of no fault to the patient but of our own due to lack of medicals taff to complete this request in a timely manner.  Renee Pain

## 2015-06-08 NOTE — Telephone Encounter (Signed)
No orders need to be placed. The nurses can order this.

## 2015-06-08 NOTE — Telephone Encounter (Signed)
Patient scheduled for 06/14/2015 for 3:45pm with nurse.

## 2015-06-08 NOTE — Telephone Encounter (Signed)
Are there any orders that need to be placed. Please advise

## 2015-06-08 NOTE — Telephone Encounter (Signed)
LVM advising patient to schedule nurse visit at [facility] location

## 2015-06-13 ENCOUNTER — Other Ambulatory Visit (HOSPITAL_COMMUNITY): Payer: Self-pay | Admitting: Internal Medicine

## 2015-06-13 MED FILL — FUROSEMIDE 80 MG TABLET: 80 | 30 days supply | Qty: 30 | Fill #0

## 2015-06-14 ENCOUNTER — Ambulatory Visit (HOSPITAL_BASED_OUTPATIENT_CLINIC_OR_DEPARTMENT_OTHER)
Admission: RE | Admit: 2015-06-14 | Discharge: 2015-06-14 | Disposition: A | Discharge status: Home / Self Care | Payer: 59 | Source: Ambulatory Visit | Attending: Family Medicine | Admitting: Family Medicine

## 2015-06-14 ENCOUNTER — Ambulatory Visit (INDEPENDENT_AMBULATORY_CARE_PROVIDER_SITE_OTHER): Payer: 59 | Admitting: Behavioral Health

## 2015-06-14 DIAGNOSIS — E785 Hyperlipidemia, unspecified: Secondary | ICD-10-CM

## 2015-06-14 DIAGNOSIS — R928 Other abnormal and inconclusive findings on diagnostic imaging of breast: Secondary | ICD-10-CM | POA: Diagnosis not present

## 2015-06-14 DIAGNOSIS — Z1231 Encounter for screening mammogram for malignant neoplasm of breast: Secondary | ICD-10-CM | POA: Diagnosis not present

## 2015-06-14 DIAGNOSIS — Z23 Encounter for immunization: Secondary | ICD-10-CM

## 2015-06-14 NOTE — Progress Notes (Signed)
Pre visit review using our clinic review tool, if applicable. No additional management support is needed unless otherwise documented below in the visit note.  Patient in office for Hepatitis B vaccination. IM given in Left Deltoid. Patient tolerated injection well. Next appointment scheduled for 07/15/15 at 3:00 PM.

## 2015-06-15 MED FILL — CARVEDILOL 12.5 MG TABLET: 12.5 | 30 days supply | Qty: 60 | Fill #2

## 2015-06-16 ENCOUNTER — Other Ambulatory Visit: Payer: Self-pay | Admitting: Family Medicine

## 2015-06-16 DIAGNOSIS — R928 Other abnormal and inconclusive findings on diagnostic imaging of breast: Secondary | ICD-10-CM

## 2015-06-21 MED FILL — SUMATRIPTAN SUCC 50 MG TAB: 50 | 25 days supply | Qty: 7 | Fill #3

## 2015-06-22 ENCOUNTER — Other Ambulatory Visit: Payer: 59

## 2015-06-27 ENCOUNTER — Other Ambulatory Visit: Payer: Self-pay | Admitting: Internal Medicine

## 2015-06-27 MED ORDER — ALPRAZOLAM 0.25 MG PO TABS: 0.2500 mg | ORAL_TABLET | Freq: Two times a day (BID) | ORAL | Status: DC | PRN

## 2015-06-27 MED FILL — ALPRAZolam 0.25 MG TABS: 0.25 | 15 days supply | Qty: 30 | Fill #0

## 2015-06-27 NOTE — Telephone Encounter (Signed)
Last OV 06/02/15 Alprazolam last filled 04/19/15 #30 with 0

## 2015-06-27 NOTE — Telephone Encounter (Signed)
Medication filled to pharmacy as requested.   

## 2015-06-28 ENCOUNTER — Ambulatory Visit
Admission: RE | Admit: 2015-06-28 | Discharge: 2015-06-28 | Disposition: A | Discharge status: Home / Self Care | Payer: 59 | Source: Ambulatory Visit | Attending: Family Medicine | Admitting: Family Medicine

## 2015-06-28 DIAGNOSIS — R928 Other abnormal and inconclusive findings on diagnostic imaging of breast: Secondary | ICD-10-CM

## 2015-06-28 MED FILL — LORATADINE 10 MG TABLET: 10 | 100 days supply | Qty: 100 | Fill #1

## 2015-07-05 ENCOUNTER — Other Ambulatory Visit (HOSPITAL_COMMUNITY): Payer: Self-pay | Admitting: Adult Health

## 2015-07-05 MED FILL — SPIRONOLACTONE 25 MG TABLET: 25 | 30 days supply | Qty: 15 | Fill #0

## 2015-07-05 MED FILL — FLUTICASONE PROP 50 MCG SPR: 50 | 30 days supply | Qty: 16 | Fill #2

## 2015-07-06 ENCOUNTER — Other Ambulatory Visit (HOSPITAL_COMMUNITY): Payer: Self-pay | Admitting: Adult Health

## 2015-07-07 ENCOUNTER — Encounter: Payer: Self-pay | Admitting: Emergency Medicine

## 2015-07-07 ENCOUNTER — Ambulatory Visit (INDEPENDENT_AMBULATORY_CARE_PROVIDER_SITE_OTHER): Payer: 59 | Admitting: Emergency Medicine

## 2015-07-07 VITALS — BP 130/72 | HR 90 | Ht 62.0 in | Wt 130.2 lb

## 2015-07-07 DIAGNOSIS — J449 Chronic obstructive pulmonary disease, unspecified: Secondary | ICD-10-CM

## 2015-07-07 DIAGNOSIS — I10 Essential (primary) hypertension: Secondary | ICD-10-CM

## 2015-07-07 DIAGNOSIS — J309 Allergic rhinitis, unspecified: Secondary | ICD-10-CM | POA: Diagnosis not present

## 2015-07-07 NOTE — Assessment & Plan Note (Signed)
Changed lisinopril to valsartan. She has benefited from this, less cough. We will continue

## 2015-07-07 NOTE — Patient Instructions (Signed)
Please continue your medications as you have been taking them Get the Flu shot in the fall  Follow with Dr Lamonte Sakai in December with full pulmonary function testing on the same day. If you have problems then call and we will see you sooner.

## 2015-07-07 NOTE — Progress Notes (Addendum)
Electrophysiology Office Note Date: 07/08/2015  ID:  Angela Shepherd, Angela Shepherd 05-Feb-1950, MRN [id number]  PCP: Annye Asa, MD Primary Cardiologist: Bensimhon Electrophysiologist: Allred  CC: CRTP follow-up   Angela Shepherd is a 65 y.o. female is seen today for Dr Rayann Heman.  She presents today for routine electrophysiology followup.  Since last being seen in clinic, she reports doing very well.  She is followed closely in the HF clinic.  She denies chest pain, palpitations, dyspnea, PND, orthopnea, nausea, vomiting, dizziness, syncope, edema, weight gain, or early satiety.  Last echo 04/2014 demonstrated EF [id number], grade 2 diastolic dysfunction   Device History: STJ dual chamber PPM implanted 1994 for Mobitz II; gen change 2002; STJ CRTP implanted 05/2012 for 1st degree AV block and intermittent Mobitz II with non-ischemic cardiomyopathy.     Past Medical History  Diagnosis Date  . Hypertrophic obstructive cardiomyopathy   . Hypokalemia   . Chronic systolic CHF (congestive heart failure) ([place])     a. EF 30-35% dx in 2011 -> most recent 40-45% by echo 04/2012. b. s/p upgrade to BiV-PPM 05/08/12. c. Med titration limited by hypotension.  . Bradycardia     a. initial PPM placed 1994 at [facility] with gen change 2002. b. 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 Jude BiV PPM 05/08/12.  . Pneumonia 2008  . Cluster headache syndrome   . Hypotension   . Pacemaker    Past Surgical History  Procedure Laterality Date  . Tonsillectomy  ~ 1959  . Tubal ligation  1984?  Marland Kitchen Pacemaker insertion  1994; 2002; 05/08/2012  . Bi-ventricular pacemaker upgrade N/A 05/08/2012    upgrade to SJM Anthem (CRT-P) by Dr Rayann Heman    Current Outpatient Prescriptions  Medication Sig Dispense Refill  . albuterol (PROVENTIL HFA;VENTOLIN HFA) 108 (90 BASE) MCG/ACT inhaler Inhale 1-2 puffs into the lungs every 6 (six) hours as needed for wheezing or shortness of breath. 1 Inhaler 0  . ALPRAZolam  (XANAX) 0.25 MG tablet Take 1 tablet (0.25 mg total) by mouth 2 (two) times daily as needed for anxiety. 30 tablet 3  . AMBULATORY NON FORMULARY MEDICATION Medication Name: 100% O2 15 leters a min for 15-20 mins  Via Nasal cannual 1 Bottle 2  . amitriptyline (ELAVIL) 10 MG tablet Take 10 mg by mouth at bedtime.    Marland Kitchen aspirin 81 MG EC tablet Take 1 tablet (81 mg total) by mouth daily. 30 tablet 6  . atorvastatin (LIPITOR) 10 MG tablet Take 1 tablet (10 mg total) by mouth daily. 90 tablet 1  . budesonide-formoterol (SYMBICORT) 160-4.5 MCG/ACT inhaler Inhale 2 puffs into the lungs 2 (two) times daily. 1 Inhaler 3  . carvedilol (COREG) 12.5 MG tablet Take 12.5 mg by mouth 2 (two) times daily with a meal.   12  . cyclobenzaprine (FLEXERIL) 5 MG tablet TAKE 1 TABLET (5 MG TOTAL) BY MOUTH 3 (THREE) TIMES DAILY AS NEEDED FOR MUSCLE SPASMS. 30 tablet 2  . fluticasone (FLONASE) 50 MCG/ACT nasal spray Place 2 sprays into both nostrils daily. 16 g 5  . furosemide (LASIX) 80 MG tablet Take 80 mg by mouth. Take (1/2) tablet by mouth twice daily ALTERNATING with (1/2) tablet by mouth every other day    . guaiFENesin (MUCINEX) 600 MG 12 hr tablet Take 1,200 mg by mouth 2 (two) times daily as needed for cough.    Marland Kitchen ipratropium-albuterol (DUONEB) 0.5-2.5 (3) MG/3ML SOLN USE 1 VIAL (3ML) BY NEBULIZER EVERY  4 TO 6 HOURS AS NEEDED 90 mL 0  . loratadine (CLARITIN) 10 MG tablet Take 1 tablet (10 mg total) by mouth daily. 30 tablet 5  . nicotine (NICODERM CQ - DOSED IN MG/24 HOURS) 14 mg/24hr patch Place 1 patch (14 mg total) onto the skin daily. 28 patch 0  . PULMICORT FLEXHALER 180 MCG/ACT inhaler   1  . REA LO 40 40 % CREA Apply 1 application topically daily as needed. Dry skin  0  . Spacer/Aero-Holding Chambers (AEROCHAMBER MV) inhaler Use as instructed with Symbicort 1 each 0  . spironolactone (ALDACTONE) 25 MG tablet TAKE 1/2 TABLET BY MOUTH DAILY 15 tablet 6  . SUMAtriptan (IMITREX) 50 MG tablet Take 1 tablet at  onset of headache. May repeat in 2 hours if headache persists or recurs. 10 tablet 6  . topiramate (TOPAMAX) 50 MG tablet Take 50 mg by mouth daily.    . valsartan (DIOVAN) 40 MG tablet Take 1 tablet (40 mg total) by mouth daily. 30 tablet 5  . Vitamin D, Ergocalciferol, (DRISDOL) 50000 units CAPS capsule Take 1 capsule (50,000 Units total) by mouth every 7 (seven) days. 12 capsule 0   No current facility-administered medications for this visit.    Allergies:   Prednisone   Social History: Social History   Social History  . Marital Status: Married    Spouse Name: N/A  . Number of Children: 2  . Years of Education: N/A   Occupational History  . [employer]    Social History Main Topics  . Smoking status: Former Smoker -- 3.00 packs/day  . Smokeless tobacco: Never Used  . Alcohol Use: 1.2 oz/week    2 Glasses of wine per week  . Drug Use: No  . Sexual Activity: No   Other Topics Concern  . Not on file   Social History Narrative   ** Merged History Encounter **        Family History: Family History  Problem Relation Age of Onset  . Diabetes    . Cardiomyopathy Sister   . Heart disease Sister   . Colon cancer Neg Hx   . Esophageal cancer Neg Hx   . Stomach cancer Neg Hx   . Rectal cancer Neg Hx   . Cancer Sister 26    uterine  . Dementia Mother   . Diabetes Brother     Review of Systems: All other systems reviewed and are otherwise negative except as noted above.   Physical Exam: VS:  BP 100/58 mmHg  Pulse 64  Ht 5\' 2"  (1.575 m)  Wt 131 lb 9.6 oz (59.693 kg)  BMI 24.06 kg/m2  SpO2 98%  LMP  (LMP Unknown) , BMI Body mass index is 24.06 kg/(m^2).  GEN- The patient is well appearing, alert and oriented x 3 today.   HEENT: normocephalic, atraumatic; sclera clear, conjunctiva pink; hearing intact; oropharynx clear; neck supple  Lungs- Normal work of breathing, CTAB Heart- Regular rate and rhythm, no murmurs, rubs or gallops  GI- soft,  non-tender, non-distended, bowel sounds present Extremities- no clubbing, cyanosis, or edema; DP/PT/radial pulses 2+ bilaterally MS- no significant deformity or atrophy Skin- warm and dry, no rash or lesion; PPM pocket well healed Psych- euthymic mood, full affect Neuro- strength and sensation are intact  PPM Interrogation- reviewed in detail today,  See PACEART report  EKG:  EKG is not ordered today.  Recent Labs: 01/20/2015: Brain Natriuretic Peptide 161.0* 06/02/2015: ALT 17; BUN 38*; Creatinine, Ser  1.32*; Hemoglobin 13.9; Platelets 229.0; Potassium 3.9; Sodium 138; TSH 1.39   Wt Readings from Last 3 Encounters:  07/08/15 131 lb 9.6 oz (59.693 kg)  07/07/15 130 lb 3.2 oz (59.058 kg)  06/02/15 130 lb 4 oz (59.081 kg)     Other studies Reviewed: Additional studies/ records that were reviewed today include: Dr Jackalyn Lombard last office notes, echo, AHF notes   Assessment and Plan:  1.  Chronic systolic heart failure Euvolemic on exam V-V optimization previously done  Normal PPM function See Pace Art report No changes today Enroll in [facility] clinic   2.  HTN Stable No change required today  3.  Paroxysmal atrial fibrillation Identified on device interrogation today Longest episode 1 hour CHADS2VASC is 3. Discussed with patient today. Will continue ASA for now.  If burden increases, will need [place].  Will monitor remotely    Current medicines are reviewed at length with the patient today.   The patient does not have concerns regarding her medicines.  The following changes were made today:  none  Labs/ tests ordered today include: none No orders of the defined types were placed in this encounter.     Disposition:   Follow up with [facility] clinic, [place], Dr Rayann Heman 1 year    Signed, Chanetta Marshall, NP 07/08/2015 3:08 PM  [place] [place] [address] [phone] (office) [phone] (fax)

## 2015-07-07 NOTE — Progress Notes (Signed)
Subjective:    Patient ID: Angela Shepherd, female    DOB: 10/17/1950, 65 y.o.   MRN: [id number]  HPI 65 year old woman with a history of tobacco use but I have seen in the past for obstructive lung disease. She also has a history of hypertrophic obstructive cardiomyopathy and pacemaker placement, hypertension with chronic systolic and diastolic CHF followed in the heart failure clinic at [facility]. I last saw her in September 2012. I have reviewed her primary function testing from that time that shows moderate obstruction without bronchodilator response, hyperinflation, and decreased diffusion capacity that corrects for alveolar volume. She has decreased her wt about 30 lbs since last time. She is referred back for recurrent episodes of cough, dyspnea, wheeze starting in October '16. She has also had continue rhinorrhea. She was given symbicort about a year ago, used it prn, still does so periodically. She was treated a few times with antibiotics, nebulized BD by Dr Birdie Riddle. Has also been seen in the ED. She hasn't had pred for over a year when she had volume overload that may have been exacerbated by pred.  She continues to have nasal gtt, dry cough. She now has albuterol / atrovent nebs for home use > uses this for days straight when her dyspnea, wheeze are worse, happens about once a month.   ROV 07/07/15 -- follow-up visit for evaluation of COPD and moderate obstruction on pulmonary function testing. I last saw her in March when we decided to start her on scheduled bronchodilator therapy. She also had other factors that were likely contributing to cough, mucus, more difficult control of her airflow obstruction. We added Symbicort via a spacer, loratadine and fluticasone nasal spray, and we changed her ACE inhibitor to valsartan. Breathing is better, cough much less. Lots of improvement in mucous and cough. Not smoking but she is around husband who smokes.    Review of Systems  Constitutional: Negative.   Negative for fever and unexpected weight change.  HENT: Positive for rhinorrhea. Negative for congestion, dental problem, ear pain, nosebleeds, postnasal drip, sinus pressure, sneezing, sore throat and trouble swallowing.   Eyes: Negative.  Negative for redness and itching.  Respiratory: Negative.  Negative for cough, chest tightness, shortness of breath and wheezing.   Cardiovascular: Negative.  Negative for palpitations and leg swelling.  Gastrointestinal: Negative.  Negative for nausea and vomiting.  Endocrine: Negative.   Genitourinary: Negative.  Negative for dysuria.  Musculoskeletal: Negative.  Negative for joint swelling.  Skin: Negative.  Negative for rash.  Allergic/Immunologic: Negative.   Neurological: Positive for headaches.  Hematological: Negative.  Does not bruise/bleed easily.  Psychiatric/Behavioral: Negative.  Negative for dysphoric mood. The patient is not nervous/anxious.    Past Medical History  Diagnosis Date  . Hypertrophic obstructive cardiomyopathy   . Hypokalemia   . Chronic systolic CHF (congestive heart failure) ([place])     a. EF 30-35% dx in 2011 -> most recent 40-45% by echo 04/2012. b. s/p upgrade to BiV-PPM 05/08/12. c. Med titration limited by hypotension.  . Bradycardia     a. initial PPM placed 1994 at [facility] with gen change 2002. b. 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. Jude BiV PPM 05/08/12.  . Pneumonia 2008  . Cluster headache syndrome   . Hypotension   . Pacemaker      Family History  Problem Relation Age of Onset  . Diabetes    . Cardiomyopathy Sister   . Heart disease Sister   .  Colon cancer Neg Hx   . Esophageal cancer Neg Hx   . Stomach cancer Neg Hx   . Rectal cancer Neg Hx   . Cancer Sister 8    uterine  . Dementia Mother   . Diabetes Brother      Social History   Social History  . Marital Status: Married    Spouse Name: N/A  . Number of Children: 2  . Years of Education: N/A   Occupational History    . [employer]    Social History Main Topics  . Smoking status: Former Smoker -- 3.00 packs/day  . Smokeless tobacco: Never Used  . Alcohol Use: 1.2 oz/week    2 Glasses of wine per week  . Drug Use: No  . Sexual Activity: No   Other Topics Concern  . Not on file   Social History Narrative   ** Merged History Encounter **         Allergies  Allergen Reactions  . Prednisone Other (See Comments)    SOB, CHF      Outpatient Prescriptions Prior to Visit  Medication Sig Dispense Refill  . albuterol (PROVENTIL HFA;VENTOLIN HFA) 108 (90 BASE) MCG/ACT inhaler Inhale 1-2 puffs into the lungs every 6 (six) hours as needed for wheezing or shortness of breath. 1 Inhaler 0  . ALPRAZolam (XANAX) 0.25 MG tablet Take 1 tablet (0.25 mg total) by mouth 2 (two) times daily as needed for anxiety. 30 tablet 3  . AMBULATORY NON FORMULARY MEDICATION Medication Name: 100% O2 15 leters a min for 15-20 mins  Via Nasal cannual 1 Bottle 2  . amitriptyline (ELAVIL) 10 MG tablet Take 10 mg by mouth at bedtime.    Marland Kitchen aspirin EC 81 MG EC tablet Take 1 tablet (81 mg total) by mouth daily. 30 tablet 1  . atorvastatin (LIPITOR) 10 MG tablet Take 1 tablet (10 mg total) by mouth daily. 90 tablet 1  . budesonide-formoterol (SYMBICORT) 160-4.5 MCG/ACT inhaler Inhale 2 puffs into the lungs 2 (two) times daily. 1 Inhaler 3  . carvedilol (COREG) 12.5 MG tablet Take 12.5 mg by mouth 2 (two) times daily with a meal.   12  . cyclobenzaprine (FLEXERIL) 5 MG tablet TAKE 1 TABLET (5 MG TOTAL) BY MOUTH 3 (THREE) TIMES DAILY AS NEEDED FOR MUSCLE SPASMS. 30 tablet 2  . fluticasone (FLONASE) 50 MCG/ACT nasal spray Place 2 sprays into both nostrils daily. 16 g 5  . furosemide (LASIX) 80 MG tablet Take 1/2 tab Twice daily every other day ALTERNATING with 1/2 tab daily every other day 30 tablet 3  . guaiFENesin (MUCINEX) 600 MG 12 hr tablet Take 1,200 mg by mouth 2 (two) times daily as needed for cough.    Marland Kitchen  ipratropium-albuterol (DUONEB) 0.5-2.5 (3) MG/3ML SOLN USE 1 VIAL (3ML) BY NEBULIZER EVERY 4 TO 6 HOURS AS NEEDED 90 mL 0  . loratadine (CLARITIN) 10 MG tablet Take 1 tablet (10 mg total) by mouth daily. 30 tablet 5  . nicotine (NICODERM CQ - DOSED IN MG/24 HOURS) 14 mg/24hr patch Place 1 patch (14 mg total) onto the skin daily. 28 patch 0  . PULMICORT FLEXHALER 180 MCG/ACT inhaler   1  . REA LO 40 40 % CREA Apply 1 application topically daily as needed. Dry skin  0  . Spacer/Aero-Holding Chambers (AEROCHAMBER MV) inhaler Use as instructed with Symbicort 1 each 0  . spironolactone (ALDACTONE) 25 MG tablet TAKE 1/2 TABLET BY MOUTH DAILY 15 tablet  6  . SUMAtriptan (IMITREX) 50 MG tablet Take 1 tablet at onset of headache. May repeat in 2 hours if headache persists or recurs. 10 tablet 6  . topiramate (TOPAMAX) 50 MG tablet Take 50 mg by mouth daily.    . valsartan (DIOVAN) 40 MG tablet Take 1 tablet (40 mg total) by mouth daily. 30 tablet 5  . Vitamin D, Ergocalciferol, (DRISDOL) 50000 units CAPS capsule Take 1 capsule (50,000 Units total) by mouth every 7 (seven) days. 12 capsule 0  . furosemide (LASIX) 80 MG tablet Take 1/2 tab Twice daily every other day ALTERNATING with 1/2 tab daily every other day (Patient not taking: Reported on 07/07/2015) 30 tablet 3   No facility-administered medications prior to visit.         Objective:   Physical Exam Filed Vitals:   07/07/15 1451  BP: 130/72  Pulse: 90  Height: 5\' 2"  (1.575 m)  Weight: 130 lb 3.2 oz (59.058 kg)  SpO2: 98%   Gen: Pleasant, well-nourished, in no distress,  normal affect  ENT: No lesions,  mouth clear,  oropharynx clear, some nasal congestion  Neck: No JVD, no stridor  Lungs: No use of accessory muscles, no dullness to percussion, clear without rales or rhonchi  Cardiovascular: RRR, heart sounds normal, no murmur or gallops, no peripheral edema  Abdomen: soft and NT, no HSM,  BS normal  Musculoskeletal: No  deformities, no cyanosis or clubbing  Neuro: alert, non focal  Skin: Warm, no lesions or rashes      Assessment & Plan:  COPD (chronic obstructive pulmonary disease) Improved since we started scheduled bronchodilators. We will continue Symbicort via spacer. She has albuterol to use as needed but has not required it frequently since we made this change. Also note changes to treat her rhinitis  Chronic allergic rhinitis Added loratadine and fluticasone nasal spray. We will continue these  Essential hypertension Changed lisinopril to valsartan. She has benefited from this, less cough. We will continue   Baltazar Apo, MD, PhD 07/07/2015, 3:21 PM [facility] [phone] or if no answer [phone]

## 2015-07-07 NOTE — Assessment & Plan Note (Signed)
Added loratadine and fluticasone nasal spray. We will continue these

## 2015-07-07 NOTE — Assessment & Plan Note (Signed)
Improved since we started scheduled bronchodilators. We will continue Symbicort via spacer. She has albuterol to use as needed but has not required it frequently since we made this change. Also note changes to treat her rhinitis

## 2015-07-08 ENCOUNTER — Encounter: Payer: Self-pay | Admitting: Nurse Practitioner

## 2015-07-08 ENCOUNTER — Encounter: Payer: Self-pay | Admitting: Internal Medicine

## 2015-07-08 ENCOUNTER — Ambulatory Visit (INDEPENDENT_AMBULATORY_CARE_PROVIDER_SITE_OTHER): Payer: 59 | Admitting: Nurse Practitioner

## 2015-07-08 VITALS — BP 100/58 | HR 64 | Ht 62.0 in | Wt 131.6 lb

## 2015-07-08 DIAGNOSIS — I10 Essential (primary) hypertension: Secondary | ICD-10-CM

## 2015-07-08 DIAGNOSIS — I5022 Chronic systolic (congestive) heart failure: Secondary | ICD-10-CM

## 2015-07-08 MED ORDER — ASPIRIN 81 MG PO TBEC: 81.0000 mg | DELAYED_RELEASE_TABLET | Freq: Every day | ORAL | Status: DC

## 2015-07-08 MED FILL — ASPIR-LOW 81 MG TABLET EC: 81 | 120 days supply | Qty: 120 | Fill #0

## 2015-07-08 NOTE — Patient Instructions (Addendum)
Medication Instructions:   Your physician recommends that you continue on your current medications as directed. Please refer to the Current Medication list given to you today.   If you need a refill on your cardiac medications before your next appointment, please call your pharmacy.  Labwork: NONE ORDER TODAY    Testing/Procedures: NONE ORDER TODAY    Follow-Up: Your physician wants you to follow-up in: [place] will receive a reminder letter in the mail two months in advance. If you don't receive a letter, please call our office to schedule the follow-up appointment.  Remote monitoring is used to monitor your Pacemaker of ICD from home. This monitoring reduces the number of office visits required to check your device to one time per year. It allows [place] to keep an eye on the functioning of your device to ensure it is working properly. You are scheduled for a device check from home on .10/07/2015.. You may send your transmission at any time that day. If you have a wireless device, the transmission will be sent automatically. After your physician reviews your transmission, you will receive a postcard with your next transmission date.     Any Other Special Instructions Will Be Listed Below (If Applicable).

## 2015-07-09 ENCOUNTER — Encounter: Payer: Self-pay | Admitting: Nurse Practitioner

## 2015-07-11 MED FILL — VALSARTAN 40 MG TABLET: 40 | 30 days supply | Qty: 30 | Fill #3

## 2015-07-11 MED FILL — FUROSEMIDE 80 MG TABLET: 80 | 30 days supply | Qty: 30 | Fill #1

## 2015-07-14 MED FILL — CARVEDILOL 12.5 MG TABLET: 12.5 | 30 days supply | Qty: 60 | Fill #3

## 2015-07-15 ENCOUNTER — Ambulatory Visit (INDEPENDENT_AMBULATORY_CARE_PROVIDER_SITE_OTHER): Payer: 59

## 2015-07-15 ENCOUNTER — Other Ambulatory Visit (INDEPENDENT_AMBULATORY_CARE_PROVIDER_SITE_OTHER): Payer: 59

## 2015-07-15 DIAGNOSIS — Z23 Encounter for immunization: Secondary | ICD-10-CM | POA: Diagnosis not present

## 2015-07-15 DIAGNOSIS — E785 Hyperlipidemia, unspecified: Secondary | ICD-10-CM

## 2015-07-15 LAB — HEPATIC FUNCTION PANEL
ALBUMIN: 4.4 g/dL (ref 3.5–5.2)
ALT: 13 U/L (ref 0–35)
AST: 17 U/L (ref 0–37)
Alkaline Phosphatase: 66 U/L (ref 39–117)
BILIRUBIN TOTAL: 0.4 mg/dL (ref 0.2–1.2)
Bilirubin, Direct: 0 mg/dL (ref 0.0–0.3)
Total Protein: 7 g/dL (ref 6.0–8.3)

## 2015-07-15 NOTE — Progress Notes (Signed)
Pre visit review using our clinic tool,if applicable. No additional management support is needed unless otherwise documented below in the visit note.   Patient in for Hep B injection. Given IM Right Deltoid. No complaints voiced. States she will call back and schedule 3rd dose.

## 2015-07-20 LAB — CUP PACEART INCLINIC DEVICE CHECK
Date Time Interrogation Session: 20170712094855
Implantable Lead Implant Date: 19940705
Implantable Lead Implant Date: 20140501
Implantable Lead Location: 753860
Implantable Lead Model: 4524
MDC IDC LEAD IMPLANT DT: 19940705
MDC IDC LEAD LOCATION: 753858
MDC IDC LEAD LOCATION: 753859
MDC IDC LEAD MODEL: 4024
Pulse Gen Model: 3210
Pulse Gen Serial Number: 2936617

## 2015-07-25 ENCOUNTER — Other Ambulatory Visit: Payer: Self-pay | Admitting: Internal Medicine

## 2015-08-01 ENCOUNTER — Other Ambulatory Visit (HOSPITAL_COMMUNITY): Payer: Self-pay | Admitting: *Deleted

## 2015-08-02 ENCOUNTER — Encounter: Payer: Self-pay | Admitting: Family Medicine

## 2015-08-02 MED FILL — ALPRAZolam 0.25 MG TABS: 0.25 | 15 days supply | Qty: 30 | Fill #1

## 2015-08-04 MED FILL — SPIRONOLACTONE 25 MG TABLET: 25 | 30 days supply | Qty: 15 | Fill #0

## 2015-08-09 MED FILL — FUROSEMIDE 80 MG TABLET: 80 | 30 days supply | Qty: 30 | Fill #2

## 2015-08-12 MED FILL — CARVEDILOL 12.5 MG TABLET: 12.5 | 30 days supply | Qty: 60 | Fill #4

## 2015-08-22 ENCOUNTER — Telehealth (HOSPITAL_COMMUNITY): Payer: Self-pay | Admitting: Cardiology

## 2015-08-22 DIAGNOSIS — I5032 Chronic diastolic (congestive) heart failure: Secondary | ICD-10-CM

## 2015-08-22 NOTE — Telephone Encounter (Signed)
Patient called to report her weight has elevated 6 0r 7 lbs in the past 5 days. Patient reports she has done medication adjustments and sodium/fluid restrictions and no results  LMOM on patients home phone @ 1603   Per vo andy tillery,pa If patient is alternating 40 BID and 40 daily , patient can increase to 40 BID for 2-3 days

## 2015-08-23 MED FILL — VALSARTAN 40 MG TABLET: 40 | 30 days supply | Qty: 30 | Fill #4

## 2015-08-25 NOTE — Telephone Encounter (Signed)
Spoke w/pt today, she reports she increased her Lasix to 80 mg BID for past 2 days and wt has went from 135 lb to 132 lb today, she reports her base weight is 125 lb so still up about 7 lbs.  She reports abd is swellen, does seem a little better but she can tell it's still swollen.  Denies LE edema, she does report some occasional SOB, mainly if she goes outside in the heat.  Will discuss w/Dr Bensimhon and call pt back.

## 2015-08-25 NOTE — Telephone Encounter (Signed)
Per Dr Haroldine Laws get bmet and bnp soon.  Pt aware, she is unable to come for labs today, she will come at 8:30 in AM, once labs results will discuss further lasix dosing with Dr Haroldine Laws

## 2015-08-26 ENCOUNTER — Ambulatory Visit (HOSPITAL_COMMUNITY)
Admission: RE | Admit: 2015-08-26 | Discharge: 2015-08-26 | Disposition: A | Discharge status: Home / Self Care | Payer: 59 | Source: Ambulatory Visit | Attending: Cardiology | Admitting: Cardiology

## 2015-08-26 DIAGNOSIS — I5032 Chronic diastolic (congestive) heart failure: Secondary | ICD-10-CM | POA: Insufficient documentation

## 2015-08-26 LAB — BASIC METABOLIC PANEL
ANION GAP: 8 (ref 5–15)
BUN: 39 mg/dL — AB (ref 6–20)
CALCIUM: 9.4 mg/dL (ref 8.9–10.3)
CO2: 28 mmol/L (ref 22–32)
CREATININE: 1.48 mg/dL — AB (ref 0.44–1.00)
Chloride: 102 mmol/L (ref 101–111)
GFR calc Af Amer: 42 mL/min — ABNORMAL LOW (ref >60)
GFR, EST NON AFRICAN AMERICAN: 37 mL/min — AB (ref >60)
GLUCOSE: 101 mg/dL — AB (ref 65–99)
Potassium: 3.7 mmol/L (ref 3.5–5.1)
Sodium: 138 mmol/L (ref 135–145)

## 2015-08-26 LAB — BRAIN NATRIURETIC PEPTIDE: B Natriuretic Peptide: 393.2 pg/mL — ABNORMAL HIGH (ref 0.0–100.0)

## 2015-08-26 MED ORDER — FUROSEMIDE 80 MG PO TABS: 80.0000 mg | ORAL_TABLET | Freq: Every day | ORAL | Status: DC

## 2015-08-26 NOTE — Telephone Encounter (Signed)
Reviewed labs w/Dr Bensimhon, he advised pt decrease Lasix to 80 mg daily and repeat labs in 7-10 days.  Pt aware and agreeable, she states wt was down to 130 lb today.

## 2015-09-02 MED FILL — SPIRONOLACTONE 25 MG TABLET: 25 | 30 days supply | Qty: 15 | Fill #1

## 2015-09-07 MED FILL — FLUTICASONE PROP 50 MCG SPR: 50 | 30 days supply | Qty: 16 | Fill #3

## 2015-09-07 MED FILL — FUROSEMIDE 80 MG TABLET: 80 | 30 days supply | Qty: 30 | Fill #3

## 2015-09-13 MED FILL — CARVEDILOL 12.5 MG TABLET: 12.5 | 30 days supply | Qty: 60 | Fill #5

## 2015-09-23 ENCOUNTER — Encounter: Payer: Self-pay | Admitting: General Practice

## 2015-09-23 ENCOUNTER — Other Ambulatory Visit: Payer: Self-pay | Admitting: General Practice

## 2015-09-23 MED ORDER — ESCITALOPRAM OXALATE 10 MG PO TABS: ORAL_TABLET | ORAL | 3 refills | Status: DC

## 2015-09-23 MED FILL — ESCITALOPRAM 10 MG TABLET: 10 | 30 days supply | Qty: 30 | Fill #0

## 2015-10-03 MED FILL — SPIRONOLACTONE 25 MG TABLET: 25 | 30 days supply | Qty: 15 | Fill #2

## 2015-10-05 MED FILL — VALSARTAN 40 MG TABLET: 40 | 30 days supply | Qty: 30 | Fill #5

## 2015-10-06 ENCOUNTER — Other Ambulatory Visit (HOSPITAL_COMMUNITY): Payer: Self-pay | Admitting: Internal Medicine

## 2015-10-06 MED FILL — FUROSEMIDE 80 MG TABLET: 80 | 30 days supply | Qty: 30 | Fill #0

## 2015-10-07 ENCOUNTER — Ambulatory Visit (INDEPENDENT_AMBULATORY_CARE_PROVIDER_SITE_OTHER): Payer: 59 | Admitting: *Deleted

## 2015-10-07 DIAGNOSIS — R001 Bradycardia, unspecified: Secondary | ICD-10-CM | POA: Diagnosis not present

## 2015-10-07 NOTE — Progress Notes (Signed)
Remote pacemaker transmission.   

## 2015-10-10 ENCOUNTER — Encounter: Payer: Self-pay | Admitting: Nurse Practitioner

## 2015-10-12 ENCOUNTER — Encounter: Payer: Self-pay | Admitting: Cardiology

## 2015-10-12 MED FILL — CARVEDILOL 12.5 MG TABLET: 12.5 | 30 days supply | Qty: 60 | Fill #6

## 2015-10-14 DIAGNOSIS — L729 Follicular cyst of the skin and subcutaneous tissue, unspecified: Secondary | ICD-10-CM | POA: Diagnosis not present

## 2015-10-14 DIAGNOSIS — L820 Inflamed seborrheic keratosis: Secondary | ICD-10-CM | POA: Diagnosis not present

## 2015-10-17 ENCOUNTER — Other Ambulatory Visit: Payer: Self-pay | Admitting: Family Medicine

## 2015-10-17 MED FILL — SUMATRIPTAN SUCC 50 MG TAB: 50 | 30 days supply | Qty: 9 | Fill #0

## 2015-10-31 MED FILL — FLUTICASONE PROP 50 MCG SPR: 50 | 30 days supply | Qty: 16 | Fill #4

## 2015-11-01 ENCOUNTER — Other Ambulatory Visit: Payer: Self-pay | Admitting: Emergency Medicine

## 2015-11-01 MED FILL — VALSARTAN 40 MG TABLET: 40 | 30 days supply | Qty: 30 | Fill #0

## 2015-11-01 MED FILL — SPIRONOLACTONE 25 MG TABLET: 25 | 90 days supply | Qty: 45 | Fill #3

## 2015-11-01 MED FILL — FUROSEMIDE 80 MG TABLET: 80 | 90 days supply | Qty: 90 | Fill #1

## 2015-11-02 LAB — CUP PACEART REMOTE DEVICE CHECK
Battery Remaining Longevity: 74 mo
Battery Voltage: 2.95 V
Brady Statistic AP VS Percent: 1 %
Brady Statistic AS VP Percent: 61 %
Date Time Interrogation Session: 20170929080853
Implantable Lead Location: 753858
Implantable Lead Location: 753860
Lead Channel Impedance Value: 360 Ohm
Lead Channel Impedance Value: 460 Ohm
Lead Channel Pacing Threshold Amplitude: 1 V
Lead Channel Pacing Threshold Pulse Width: 0.4 ms
Lead Channel Pacing Threshold Pulse Width: 0.4 ms
Lead Channel Pacing Threshold Pulse Width: 1 ms
Lead Channel Sensing Intrinsic Amplitude: 6.1 mV
Lead Channel Setting Pacing Amplitude: 2.5 V
Lead Channel Setting Pacing Pulse Width: 1 ms
Lead Channel Setting Sensing Sensitivity: 2 mV
MDC IDC LEAD IMPLANT DT: 19940705
MDC IDC LEAD IMPLANT DT: 19940705
MDC IDC LEAD IMPLANT DT: 20140501
MDC IDC LEAD LOCATION: 753859
MDC IDC LEAD MODEL: 4024
MDC IDC MSMT BATTERY REMAINING PERCENTAGE: 91 %
MDC IDC MSMT LEADCHNL LV IMPEDANCE VALUE: 590 Ohm
MDC IDC MSMT LEADCHNL LV PACING THRESHOLD AMPLITUDE: 0.75 V
MDC IDC MSMT LEADCHNL RA SENSING INTR AMPL: 2 mV
MDC IDC MSMT LEADCHNL RV PACING THRESHOLD AMPLITUDE: 1.25 V
MDC IDC PG SERIAL: 2936617
MDC IDC SET LEADCHNL RA PACING AMPLITUDE: 2 V
MDC IDC SET LEADCHNL RV PACING AMPLITUDE: 2.25 V
MDC IDC SET LEADCHNL RV PACING PULSEWIDTH: 0.4 ms
MDC IDC STAT BRADY AP VP PERCENT: 38 %
MDC IDC STAT BRADY AS VS PERCENT: 1 %
MDC IDC STAT BRADY RA PERCENT PACED: 38 %

## 2015-11-08 MED FILL — SYMBICORT 160-4.5 MCG INH: 160-4.5 | 30 days supply | Qty: 10 | Fill #1

## 2015-11-09 MED FILL — ALPRAZolam 0.25 MG TABS: 0.25 | 15 days supply | Qty: 30 | Fill #2

## 2015-11-10 MED FILL — CARVEDILOL 12.5 MG TABLET: 12.5 | 30 days supply | Qty: 60 | Fill #7

## 2015-12-05 MED FILL — VALSARTAN 40 MG TABLET: 40 | 30 days supply | Qty: 30 | Fill #1

## 2015-12-09 MED FILL — CARVEDILOL 12.5 MG TABLET: 12.5 | 30 days supply | Qty: 60 | Fill #8

## 2015-12-26 ENCOUNTER — Other Ambulatory Visit: Payer: Self-pay | Admitting: Family Medicine

## 2015-12-26 DIAGNOSIS — Z780 Asymptomatic menopausal state: Secondary | ICD-10-CM

## 2015-12-27 ENCOUNTER — Ambulatory Visit (HOSPITAL_BASED_OUTPATIENT_CLINIC_OR_DEPARTMENT_OTHER)
Admission: RE | Admit: 2015-12-27 | Discharge: 2015-12-27 | Disposition: A | Discharge status: Home / Self Care | Payer: 59 | Source: Ambulatory Visit | Attending: Family Medicine | Admitting: Family Medicine

## 2015-12-27 DIAGNOSIS — Z780 Asymptomatic menopausal state: Secondary | ICD-10-CM | POA: Diagnosis not present

## 2015-12-27 DIAGNOSIS — M818 Other osteoporosis without current pathological fracture: Secondary | ICD-10-CM | POA: Diagnosis not present

## 2015-12-27 DIAGNOSIS — M810 Age-related osteoporosis without current pathological fracture: Secondary | ICD-10-CM | POA: Diagnosis not present

## 2015-12-28 DIAGNOSIS — H524 Presbyopia: Secondary | ICD-10-CM | POA: Diagnosis not present

## 2015-12-28 DIAGNOSIS — H2513 Age-related nuclear cataract, bilateral: Secondary | ICD-10-CM | POA: Diagnosis not present

## 2015-12-28 DIAGNOSIS — H04123 Dry eye syndrome of bilateral lacrimal glands: Secondary | ICD-10-CM | POA: Diagnosis not present

## 2015-12-29 ENCOUNTER — Encounter: Payer: Self-pay | Admitting: Family Medicine

## 2015-12-29 MED ORDER — CELECOXIB 100 MG PO CAPS: 100.0000 mg | ORAL_CAPSULE | Freq: Two times a day (BID) | ORAL | 3 refills | Status: DC

## 2015-12-29 MED FILL — CELECOXIB 100 MG CAPSULE: 100 | 30 days supply | Qty: 60 | Fill #0

## 2016-01-04 MED FILL — FLUTICASONE PROP 50 MCG SPR: 50 | 30 days supply | Qty: 16 | Fill #5

## 2016-01-04 MED FILL — VALSARTAN 40 MG TABLET: 40 | 30 days supply | Qty: 30 | Fill #2

## 2016-01-11 ENCOUNTER — Ambulatory Visit (INDEPENDENT_AMBULATORY_CARE_PROVIDER_SITE_OTHER): Payer: 59 | Admitting: *Deleted

## 2016-01-11 DIAGNOSIS — R001 Bradycardia, unspecified: Secondary | ICD-10-CM | POA: Diagnosis not present

## 2016-01-12 NOTE — Progress Notes (Signed)
Remote pacemaker transmission.   

## 2016-01-13 ENCOUNTER — Encounter: Payer: Self-pay | Admitting: Cardiology

## 2016-01-28 LAB — CUP PACEART REMOTE DEVICE CHECK
Battery Remaining Longevity: 73 mo
Battery Remaining Percentage: 91 %
Battery Voltage: 2.95 V
Brady Statistic AS VS Percent: 1 %
Brady Statistic RA Percent Paced: 33 %
Date Time Interrogation Session: 20180103075805
Implantable Lead Implant Date: 19940705
Implantable Lead Implant Date: 20140501
Implantable Lead Location: 753858
Implantable Lead Location: 753859
Implantable Lead Model: 4024
Implantable Pulse Generator Implant Date: 20140501
Lead Channel Impedance Value: 430 Ohm
Lead Channel Pacing Threshold Amplitude: 1 V
Lead Channel Pacing Threshold Amplitude: 1.25 V
Lead Channel Pacing Threshold Pulse Width: 0.4 ms
Lead Channel Sensing Intrinsic Amplitude: 2.3 mV
Lead Channel Setting Pacing Amplitude: 2.25 V
Lead Channel Setting Pacing Amplitude: 2.5 V
Lead Channel Setting Pacing Pulse Width: 0.4 ms
Lead Channel Setting Pacing Pulse Width: 1 ms
MDC IDC LEAD IMPLANT DT: 19940705
MDC IDC LEAD LOCATION: 753860
MDC IDC MSMT LEADCHNL LV IMPEDANCE VALUE: 590 Ohm
MDC IDC MSMT LEADCHNL LV PACING THRESHOLD AMPLITUDE: 0.75 V
MDC IDC MSMT LEADCHNL LV PACING THRESHOLD PULSEWIDTH: 1 ms
MDC IDC MSMT LEADCHNL RV IMPEDANCE VALUE: 380 Ohm
MDC IDC MSMT LEADCHNL RV PACING THRESHOLD PULSEWIDTH: 0.4 ms
MDC IDC MSMT LEADCHNL RV SENSING INTR AMPL: 7.8 mV
MDC IDC SET LEADCHNL RA PACING AMPLITUDE: 2 V
MDC IDC SET LEADCHNL RV SENSING SENSITIVITY: 2 mV
MDC IDC STAT BRADY AP VP PERCENT: 34 %
MDC IDC STAT BRADY AP VS PERCENT: 1 %
MDC IDC STAT BRADY AS VP PERCENT: 65 %
Pulse Gen Model: 3210
Pulse Gen Serial Number: 2936617

## 2016-01-29 ENCOUNTER — Encounter: Payer: Self-pay | Admitting: Nurse Practitioner

## 2016-01-30 MED FILL — SPIRONOLACTONE 25 MG TABLET: 25 | 30 days supply | Qty: 15 | Fill #4

## 2016-01-31 ENCOUNTER — Telehealth: Payer: Self-pay | Admitting: General Practice

## 2016-01-31 NOTE — Telephone Encounter (Signed)
Called pt to schedule a nurse visit for her Prolia injection. Needs to be scheduled at [facility].

## 2016-02-01 ENCOUNTER — Other Ambulatory Visit (HOSPITAL_COMMUNITY): Payer: Self-pay | Admitting: Internal Medicine

## 2016-02-01 MED FILL — FUROSEMIDE 80 MG TABLET: 80 | 30 days supply | Qty: 30 | Fill #0

## 2016-02-03 NOTE — Telephone Encounter (Signed)
Pt scheduled for prolia injection on 02/10/16 at 1:30pm, pt aware appt is at [facility] office.

## 2016-02-07 ENCOUNTER — Ambulatory Visit: Payer: 59 | Admitting: Neurology

## 2016-02-10 ENCOUNTER — Ambulatory Visit (INDEPENDENT_AMBULATORY_CARE_PROVIDER_SITE_OTHER): Payer: 59 | Admitting: Family Medicine

## 2016-02-10 DIAGNOSIS — M810 Age-related osteoporosis without current pathological fracture: Secondary | ICD-10-CM | POA: Diagnosis not present

## 2016-02-10 MED ORDER — DENOSUMAB 60 MG/ML ~~LOC~~ SOLN
60.0000 mg | Freq: Once | SUBCUTANEOUS | Status: AC
  Administered 2016-02-10: 60 mg via SUBCUTANEOUS

## 2016-02-13 MED FILL — CARVEDILOL 12.5 MG TABLET: 12.5 | 30 days supply | Qty: 60 | Fill #9

## 2016-02-13 MED FILL — VALSARTAN 40 MG TABLET: 40 | 30 days supply | Qty: 30 | Fill #3

## 2016-02-28 ENCOUNTER — Encounter: Payer: Self-pay | Admitting: General Practice

## 2016-02-28 MED FILL — SPIRONOLACTONE 25 MG TABLET: 25 | 30 days supply | Qty: 15 | Fill #1

## 2016-02-29 ENCOUNTER — Other Ambulatory Visit: Payer: Self-pay | Admitting: Emergency Medicine

## 2016-02-29 MED FILL — FUROSEMIDE 80 MG TABLET: 80 | 30 days supply | Qty: 30 | Fill #1

## 2016-02-29 MED FILL — CELECOXIB 100 MG CAPSULE: 100 | 30 days supply | Qty: 60 | Fill #1

## 2016-03-01 MED FILL — FLUTICASONE PROP 50 MCG SPR: 50 | 30 days supply | Qty: 16 | Fill #0

## 2016-03-02 ENCOUNTER — Ambulatory Visit: Payer: 59 | Admitting: Neurology

## 2016-03-13 MED FILL — CARVEDILOL 12.5 MG TABLET: 12.5 | 30 days supply | Qty: 60 | Fill #10

## 2016-03-13 MED FILL — VALSARTAN 40 MG TABLET: 40 | 30 days supply | Qty: 30 | Fill #4

## 2016-03-28 ENCOUNTER — Other Ambulatory Visit: Payer: Self-pay | Admitting: Family Medicine

## 2016-03-28 MED FILL — SYMBICORT 160-4.5 MCG INH: 160-4.5 | 30 days supply | Qty: 10 | Fill #0

## 2016-03-29 MED FILL — FUROSEMIDE 80 MG TABLET: 80 | 30 days supply | Qty: 30 | Fill #2

## 2016-04-09 ENCOUNTER — Telehealth: Payer: Self-pay | Admitting: Emergency Medicine

## 2016-04-09 NOTE — Telephone Encounter (Signed)
Rec'd FMLA forms from [employer] fwd to [employer] via interoffice mail - pr  °

## 2016-04-10 ENCOUNTER — Encounter (HOSPITAL_COMMUNITY): Payer: Self-pay | Admitting: *Deleted

## 2016-04-10 ENCOUNTER — Telehealth (HOSPITAL_COMMUNITY): Payer: Self-pay | Admitting: Internal Medicine

## 2016-04-10 NOTE — Progress Notes (Signed)
Received FMLA forms for patient from [employer] on 04/05/2016.  I have faxed the incomplete forms back to them today stating that patient will need to be seen in our clinic before we can complete the request.  Patient has not been seen by Dr. Haroldine Laws since 03/04/15.   [name] (clinic scheduler) has reached out to patient and left VM requesting for her to call us back and schedule appointment.

## 2016-04-10 NOTE — Telephone Encounter (Signed)
Called and left message for patient to call back.  Per Flonnie Hailstone, RN pt needs next available f/u appt with Dr. Haroldine Laws.

## 2016-04-11 ENCOUNTER — Encounter: Payer: 59 | Admitting: *Deleted

## 2016-04-11 ENCOUNTER — Telehealth: Payer: Self-pay | Admitting: Cardiology

## 2016-04-11 MED FILL — SPIRONOLACTONE 25 MG TABLET: 25 | 30 days supply | Qty: 15 | Fill #2

## 2016-04-11 MED FILL — CARVEDILOL 12.5 MG TABLET: 12.5 | 30 days supply | Qty: 60 | Fill #11

## 2016-04-11 MED FILL — FLUTICASONE PROP 50 MCG SPR: 50 | 30 days supply | Qty: 16 | Fill #1

## 2016-04-11 NOTE — Telephone Encounter (Signed)
LMOVM reminding pt to send remote transmission.   

## 2016-04-12 MED FILL — VALSARTAN 40 MG TABLET: 40 | 30 days supply | Qty: 30 | Fill #5

## 2016-04-13 ENCOUNTER — Encounter: Payer: Self-pay | Admitting: Cardiology

## 2016-04-13 NOTE — Telephone Encounter (Signed)
Patient returned my call.  Pt requested a Friday appt and was scheduled 06/01/16 @1 :40 pm with Dr. Haroldine Laws

## 2016-04-16 ENCOUNTER — Ambulatory Visit (INDEPENDENT_AMBULATORY_CARE_PROVIDER_SITE_OTHER): Payer: 59 | Admitting: *Deleted

## 2016-04-16 DIAGNOSIS — R001 Bradycardia, unspecified: Secondary | ICD-10-CM

## 2016-04-16 NOTE — Telephone Encounter (Signed)
Spoke with patient, advised that transmission was not received.  Merlin monitor is updated, but last successful transmission was in 01/2016.  Patient reports recent power outages.  Requested that she try again.  She will attempt to send a transmission this afternoon around 4:30pm.  Advised that I will call her tomorrow if transmission is not received and she is agreeable to this plan.

## 2016-04-17 NOTE — Telephone Encounter (Signed)
LMTCB//sss 

## 2016-04-17 NOTE — Telephone Encounter (Signed)
Patient returned my call. Informed patient that remote was received.  Patient verbalized understanding.  Patient also inquired about other ways to set up her Merlin monitor once she gets rid of her landline. I talked to her about using a cell adapter. Cell adapter mailed to patient upon request.

## 2016-04-17 NOTE — Progress Notes (Signed)
Remote pacemaker transmission.   

## 2016-04-18 ENCOUNTER — Encounter: Payer: Self-pay | Admitting: Cardiology

## 2016-04-20 LAB — CUP PACEART REMOTE DEVICE CHECK
Battery Remaining Longevity: 75 mo
Battery Remaining Percentage: 91 %
Brady Statistic AP VS Percent: 1 %
Brady Statistic AS VP Percent: 62 %
Brady Statistic AS VS Percent: 1 %
Brady Statistic RA Percent Paced: 37 %
Date Time Interrogation Session: 20180409211625
Implantable Lead Implant Date: 19940705
Implantable Lead Location: 753858
Implantable Lead Location: 753859
Implantable Lead Location: 753860
Implantable Lead Model: 4024
Implantable Pulse Generator Implant Date: 20140501
Lead Channel Impedance Value: 460 Ohm
Lead Channel Impedance Value: 630 Ohm
Lead Channel Pacing Threshold Amplitude: 0.75 V
Lead Channel Pacing Threshold Amplitude: 1.25 V
Lead Channel Pacing Threshold Pulse Width: 0.4 ms
Lead Channel Pacing Threshold Pulse Width: 0.4 ms
Lead Channel Pacing Threshold Pulse Width: 1 ms
Lead Channel Sensing Intrinsic Amplitude: 2 mV
Lead Channel Sensing Intrinsic Amplitude: 4.9 mV
Lead Channel Setting Pacing Amplitude: 2 V
Lead Channel Setting Pacing Amplitude: 2.25 V
Lead Channel Setting Pacing Amplitude: 2.5 V
Lead Channel Setting Pacing Pulse Width: 0.4 ms
Lead Channel Setting Sensing Sensitivity: 2 mV
MDC IDC LEAD IMPLANT DT: 19940705
MDC IDC LEAD IMPLANT DT: 20140501
MDC IDC MSMT BATTERY VOLTAGE: 2.95 V
MDC IDC MSMT LEADCHNL RA PACING THRESHOLD AMPLITUDE: 1 V
MDC IDC MSMT LEADCHNL RV IMPEDANCE VALUE: 380 Ohm
MDC IDC PG SERIAL: 2936617
MDC IDC SET LEADCHNL LV PACING PULSEWIDTH: 1 ms
MDC IDC STAT BRADY AP VP PERCENT: 38 %

## 2016-04-30 MED FILL — FUROSEMIDE 80 MG TABLET: 80 | 30 days supply | Qty: 30 | Fill #3

## 2016-05-04 ENCOUNTER — Encounter: Payer: Self-pay | Admitting: Neurology

## 2016-05-04 ENCOUNTER — Ambulatory Visit (INDEPENDENT_AMBULATORY_CARE_PROVIDER_SITE_OTHER): Payer: 59 | Admitting: Neurology

## 2016-05-04 VITALS — BP 98/60 | HR 69 | Temp 97.2°F | Resp 16 | Ht 61.0 in | Wt 124.1 lb

## 2016-05-04 DIAGNOSIS — G44019 Episodic cluster headache, not intractable: Secondary | ICD-10-CM | POA: Diagnosis not present

## 2016-05-04 NOTE — Patient Instructions (Signed)
Continue topiramate 50mg  at bedtime You have the oxygen as needed Follow up in one year

## 2016-05-04 NOTE — Progress Notes (Signed)
NEUROLOGY FOLLOW UP OFFICE NOTE  Angela Shepherd [id number]  HISTORY OF PRESENT ILLNESS: Angela Shepherd is a 66 year old right-handed woman with systolic CHF, hypertension who follows up for cluster headache.   UPDATE: No headaches since last visit. 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  Rarely, nausea. Initial Duration:  1 hour at a time, occuring once to 3 times back to back. Initial Frequency:  Episodic, usually during months of April, May or October.  Occurs daily between 1 to 4 weeks. Triggers/exacerbating factors:  none Relieving factors:  none Activity:  Cannot function   Past abortive medication:  Prednisone taper (previously effective but cannot take due to CHF), Tylenol Migraine, sumatriptan 50mg  Past preventative medication:  amitriptyline Other past therapy:  none   Caffeine:  1 cup of coffee and 1 cup of tea daily Alcohol:  no Smoker:  former Diet:  Healthy.  On fluid restriction Exercise:  Walks on treadmill daily Depression/stress:  Fairly stable.  Husband passed away last year.  Primary caregiver now for her elderly mother. Sleep hygiene:  poor Family history of headache:  Paternal grandfather (said he had "lightening bolts in my head"), mom Works in Audiological scientist.    PAST MEDICAL HISTORY: Past Medical History:  Diagnosis Date  . Bradycardia    a. initial PPM placed 1994 at [facility] with gen change 2002. b. 1*AVB & intermittent 2nd degree AVB + CHF --> upgrade to [facility]. Jude BiV PPM 05/08/12.  . Chronic systolic CHF (congestive heart failure) ([place])    a. EF 30-35% dx in 2011 -> most recent 40-45% by echo 04/2012. b. s/p upgrade to  BiV-PPM 05/08/12. c. Med titration limited by hypotension.  . Cluster headache syndrome   . Hypertr obst cardiomyop   . Hypokalemia   . Hypotension   . Paroxysmal atrial fibrillation (HCC)    a. identified on PPM interrogation b. longest episode 1 hour; if burden increases, will need [place]   . Pneumonia 2008    MEDICATIONS: Current Outpatient Prescriptions on File Prior to Visit  Medication Sig Dispense Refill  . albuterol (PROVENTIL HFA;VENTOLIN HFA) 108 (90 BASE) MCG/ACT inhaler Inhale 1-2 puffs into the lungs every 6 (six) hours as needed for wheezing or shortness of breath. 1 Inhaler 0  . ALPRAZolam (XANAX) 0.25 MG tablet Take 1 tablet (0.25 mg total) by mouth 2 (two) times daily as needed for anxiety. 30 tablet 3  . AMBULATORY NON FORMULARY MEDICATION Medication Name: 100% O2 15 leters a min for 15-20 mins  Via Nasal cannual 1 Bottle 2  . amitriptyline (ELAVIL) 10 MG tablet Take 10 mg by mouth at bedtime.    Marland Kitchen aspirin 81 MG EC tablet Take 1 tablet (81 mg total) by mouth daily. 30 tablet 6  . atorvastatin (LIPITOR) 10 MG tablet Take 1 tablet (10 mg total) by mouth daily. 90 tablet 1  . carvedilol (COREG) 12.5 MG tablet Take 12.5 mg by mouth 2 (two) times daily with a meal.   12  . celecoxib (CELEBREX) 100 MG capsule Take 1 capsule (100 mg total) by mouth 2 (two) times daily. 60 capsule 3  . cyclobenzaprine (FLEXERIL) 5 MG tablet TAKE 1 TABLET (5 MG TOTAL) BY MOUTH 3 (  THREE) TIMES DAILY AS NEEDED FOR MUSCLE SPASMS. 30 tablet 2  . escitalopram (LEXAPRO) 10 MG tablet TAKE 1 TABLET (10 MG TOTAL) BY MOUTH DAILY. 30 tablet 3  . fluticasone (FLONASE) 50 MCG/ACT nasal spray PLACE 2 SPRAYS INTO BOTH NOSTRILS DAILY. 16 g 2  . furosemide (LASIX) 80 MG tablet TAKE 1 TABLET (80 MG TOTAL) BY MOUTH DAILY. 30 tablet 3  . guaiFENesin (MUCINEX) 600 MG 12 hr tablet Take 1,200 mg by mouth 2 (two) times daily as needed for cough.    Marland Kitchen ipratropium-albuterol (DUONEB) 0.5-2.5 (3) MG/3ML SOLN USE 1 VIAL (3ML) BY  NEBULIZER EVERY 4 TO 6 HOURS AS NEEDED 90 mL 0  . loratadine (CLARITIN) 10 MG tablet Take 1 tablet (10 mg total) by mouth daily. 30 tablet 5  . nicotine (NICODERM CQ - DOSED IN MG/24 HOURS) 14 mg/24hr patch Place 1 patch (14 mg total) onto the skin daily. 28 patch 0  . PULMICORT FLEXHALER 180 MCG/ACT inhaler   1  . REA LO 40 40 % CREA Apply 1 application topically daily as needed. Dry skin  0  . spironolactone (ALDACTONE) 25 MG tablet TAKE 1/2 TABLET BY MOUTH DAILY 15 tablet 6  . SUMAtriptan (IMITREX) 50 MG tablet TAKE 1 TABLET AT ONSET OF HEADACHE. MAY REPEAT IN 2 HOURS IF HEADACHE PERSISTS OR RECURS. 10 tablet 6  . SYMBICORT 160-4.5 MCG/ACT inhaler INHALE 2 PUFFS BY MOUTH INTO THE LUNGS 2 TIMES A DAY 10.2 g 3  . topiramate (TOPAMAX) 50 MG tablet Take 50 mg by mouth daily.    . valsartan (DIOVAN) 40 MG tablet TAKE 1 TABLET (40 MG TOTAL) BY MOUTH DAILY. 30 tablet 5  . Vitamin D, Ergocalciferol, (DRISDOL) 50000 units CAPS capsule Take 1 capsule (50,000 Units total) by mouth every 7 (seven) days. 12 capsule 0  . Spacer/Aero-Holding Chambers (AEROCHAMBER MV) inhaler Use as instructed with Symbicort 1 each 0   No current facility-administered medications on file prior to visit.     ALLERGIES: Allergies  Allergen Reactions  . Prednisone Other (See Comments)    SOB, CHF     FAMILY HISTORY: Family History  Problem Relation Age of Onset  . Diabetes    . Cardiomyopathy Sister   . Heart disease Sister   . Colon cancer Neg Hx   . Esophageal cancer Neg Hx   . Stomach cancer Neg Hx   . Rectal cancer Neg Hx   . Cancer Sister 67    uterine  . Dementia Mother   . Diabetes Brother     SOCIAL HISTORY: Social History   Social History  . Marital status: Married    Spouse name: N/A  . Number of children: 2  . Years of education: N/A   Occupational History  . [employer]   Social History Main Topics  . Smoking status: Former Smoker    Packs/day: 3.00  .  Smokeless tobacco: Never Used  . Alcohol use 1.2 oz/week    2 Glasses of wine per week  . Drug use: No  . Sexual activity: No   Other Topics Concern  . Not on file   Social History Narrative   ** Merged History Encounter **        REVIEW OF SYSTEMS: Constitutional: No fevers, chills, or sweats, no generalized fatigue, change in appetite Eyes: No visual changes, double vision, eye pain Ear, nose and throat: No hearing loss, ear pain, nasal congestion, sore throat Cardiovascular: No chest pain,  palpitations Respiratory:  No shortness of breath at rest or with exertion, wheezes GastrointestinaI: No nausea, vomiting, diarrhea, abdominal pain, fecal incontinence Genitourinary:  No dysuria, urinary retention or frequency Musculoskeletal:  No neck pain, back pain Integumentary: No rash, pruritus, skin lesions Neurological: as above Psychiatric: No depression, insomnia, anxiety Endocrine: No palpitations, fatigue, diaphoresis, mood swings, change in appetite, change in weight, increased thirst Hematologic/Lymphatic:  No purpura, petechiae. Allergic/Immunologic: no itchy/runny eyes, nasal congestion, recent allergic reactions, rashes  PHYSICAL EXAM: Vitals:   05/04/16 1108  BP: 98/60  Pulse: 69  Resp: 16  Temp: 97.2 F (36.2 C)   General: No acute distress.  Patient appears well-groomed.  normal body habitus. Head:  Normocephalic/atraumatic Eyes:  Fundi examined but not visualized Neck: supple, no paraspinal tenderness, full range of motion Heart:  Regular rate and rhythm Lungs:  Clear to auscultation bilaterally Back: No paraspinal tenderness Neurological Exam: alert and oriented to person, place, and time. Attention span and concentration intact, recent and remote memory intact, fund of knowledge intact.  Speech fluent and not dysarthric, language intact.  CN II-XII intact. Bulk and tone normal, muscle strength 5/5 throughout.  Sensation to light touch  intact.  Deep tendon  reflexes 2+ throughout.  Finger to nose testing intact.  Gait normal, Romberg negative.  IMPRESSION: Episodic cluster headache  PLAN: Topiramate 50mg  at bedtime O2 as needed Follow up in one year   Metta Clines, DO  CC: Annye Asa, MD

## 2016-05-10 ENCOUNTER — Other Ambulatory Visit (HOSPITAL_COMMUNITY): Payer: Self-pay | Admitting: Internal Medicine

## 2016-05-11 MED FILL — CARVEDILOL 12.5 MG TABLET: 12.5 | 30 days supply | Qty: 60 | Fill #0

## 2016-05-14 ENCOUNTER — Other Ambulatory Visit: Payer: Self-pay | Admitting: Emergency Medicine

## 2016-05-14 MED FILL — SPIRONOLACTONE 25 MG TABLET: 25 | 30 days supply | Qty: 15 | Fill #3

## 2016-05-15 MED FILL — VALSARTAN 40 MG TABLET: 40 | 30 days supply | Qty: 30 | Fill #0

## 2016-05-21 MED FILL — SYMBICORT 160-4.5 MCG INH: 160-4.5 | 30 days supply | Qty: 10 | Fill #1

## 2016-05-24 ENCOUNTER — Telehealth: Payer: Self-pay | Admitting: *Deleted

## 2016-05-24 NOTE — Telephone Encounter (Signed)
Erroneous Encounter

## 2016-05-25 ENCOUNTER — Telehealth: Payer: Self-pay | Admitting: Emergency Medicine

## 2016-05-25 NOTE — Telephone Encounter (Signed)
In RB to sign folder, will await his return for signature.

## 2016-05-29 ENCOUNTER — Telehealth: Payer: Self-pay | Admitting: *Deleted

## 2016-05-29 NOTE — Telephone Encounter (Signed)
Called patient on both home and cell.  Her cell number mailbox is full and can not accept any messages.  I have left her a message on her home phone that Dr Rayann Heman does not write for intermittent FMLA for afib.  He will write for FMLA for a procedure.  I have asked that she get a yearly follow up scheduled for July and if her afib is increasing and she needs to be seen prior to that she can call the afib clinic and usually be seen that day or the next.  I let her know she could call me back with any questions she may have.

## 2016-05-29 NOTE — Telephone Encounter (Signed)
Returned call to patient.  Per Chanetta Marshall, NP last office note:  3.  Paroxysmal atrial fibrillation Identified on device interrogation today Longest episode 1 hour CHADS2VASC is 3. Discussed with patient today. Will continue ASA for now.  If burden increases, will need [place].  Will monitor remotely   She "vaguely" remembers the above but says the papers were not to come to Dr Rayann Heman.  Dr. Haroldine Laws was the one that was to get the papers not Dr Allred per the patient.  She says she paid $50 for them to be filled out.  I let her know we would send the papers to him.  She has an appointment on Fri.

## 2016-05-29 NOTE — Telephone Encounter (Signed)
Follow up     Returning a call to the nurse regarding FMLA forms.  Patient states that the forms were supposed to have been faxed to Dr Haroldine Laws and pt states that she was not aware of a diagnosis for AFIB.  Please call.  If tomorrow, call [phone].

## 2016-05-30 ENCOUNTER — Other Ambulatory Visit (HOSPITAL_COMMUNITY): Payer: Self-pay | Admitting: Internal Medicine

## 2016-05-30 MED FILL — FLUTICASONE PROP 50 MCG SPR: 50 | 30 days supply | Qty: 16 | Fill #2

## 2016-05-30 NOTE — Telephone Encounter (Signed)
FMLA Forms have been placed in Courier bag  to be sent to [facility].

## 2016-05-31 MED FILL — FUROSEMIDE 80 MG TABLET: 80 | 90 days supply | Qty: 90 | Fill #0

## 2016-05-31 NOTE — Telephone Encounter (Signed)
I gave Sharl Ma the signed FMLA paperwork and she will send back to [facility]. Thanks.

## 2016-06-01 ENCOUNTER — Ambulatory Visit (HOSPITAL_COMMUNITY)
Admission: RE | Admit: 2016-06-01 | Discharge: 2016-06-01 | Disposition: A | Discharge status: Home / Self Care | Payer: 59 | Source: Ambulatory Visit | Attending: Internal Medicine | Admitting: Internal Medicine

## 2016-06-01 ENCOUNTER — Encounter (HOSPITAL_COMMUNITY): Payer: Self-pay | Admitting: Internal Medicine

## 2016-06-01 VITALS — BP 109/63 | HR 78 | Wt 124.1 lb

## 2016-06-01 DIAGNOSIS — I959 Hypotension, unspecified: Secondary | ICD-10-CM | POA: Diagnosis not present

## 2016-06-01 DIAGNOSIS — R42 Dizziness and giddiness: Secondary | ICD-10-CM | POA: Insufficient documentation

## 2016-06-01 DIAGNOSIS — I10 Essential (primary) hypertension: Secondary | ICD-10-CM | POA: Diagnosis not present

## 2016-06-01 DIAGNOSIS — E876 Hypokalemia: Secondary | ICD-10-CM | POA: Diagnosis not present

## 2016-06-01 DIAGNOSIS — Z7982 Long term (current) use of aspirin: Secondary | ICD-10-CM | POA: Diagnosis not present

## 2016-06-01 DIAGNOSIS — I110 Hypertensive heart disease with heart failure: Secondary | ICD-10-CM | POA: Diagnosis not present

## 2016-06-01 DIAGNOSIS — Z8679 Personal history of other diseases of the circulatory system: Secondary | ICD-10-CM | POA: Diagnosis not present

## 2016-06-01 DIAGNOSIS — R001 Bradycardia, unspecified: Secondary | ICD-10-CM | POA: Insufficient documentation

## 2016-06-01 DIAGNOSIS — I480 Paroxysmal atrial fibrillation: Secondary | ICD-10-CM | POA: Diagnosis not present

## 2016-06-01 DIAGNOSIS — G44009 Cluster headache syndrome, unspecified, not intractable: Secondary | ICD-10-CM | POA: Insufficient documentation

## 2016-06-01 DIAGNOSIS — Z87891 Personal history of nicotine dependence: Secondary | ICD-10-CM | POA: Insufficient documentation

## 2016-06-01 DIAGNOSIS — I421 Obstructive hypertrophic cardiomyopathy: Secondary | ICD-10-CM | POA: Diagnosis not present

## 2016-06-01 DIAGNOSIS — I5022 Chronic systolic (congestive) heart failure: Secondary | ICD-10-CM | POA: Diagnosis not present

## 2016-06-01 LAB — BASIC METABOLIC PANEL
ANION GAP: 11 (ref 5–15)
BUN: 25 mg/dL — ABNORMAL HIGH (ref 6–20)
CHLORIDE: 103 mmol/L (ref 101–111)
CO2: 24 mmol/L (ref 22–32)
Calcium: 9.5 mg/dL (ref 8.9–10.3)
Creatinine, Ser: 1.24 mg/dL — ABNORMAL HIGH (ref 0.44–1.00)
GFR calc Af Amer: 52 mL/min — ABNORMAL LOW (ref >60)
GFR, EST NON AFRICAN AMERICAN: 45 mL/min — AB (ref >60)
GLUCOSE: 104 mg/dL — AB (ref 65–99)
POTASSIUM: 3.6 mmol/L (ref 3.5–5.1)
Sodium: 138 mmol/L (ref 135–145)

## 2016-06-01 MED ORDER — FUROSEMIDE 80 MG PO TABS: 40.0000 mg | ORAL_TABLET | Freq: Two times a day (BID) | ORAL | 3 refills | Status: DC

## 2016-06-01 NOTE — Patient Instructions (Signed)
Labs today (will call for abnormal results, otherwise no news is good news)  Follow up in 6-8 Months, we will contact you for this appointment.

## 2016-06-01 NOTE — Telephone Encounter (Signed)
Rec'd completed forms from Dr. Lamonte Sakai. Sent to [facility] via interoffice mail -pr

## 2016-06-01 NOTE — Progress Notes (Signed)
ADVANCED HF CLINIC NOTE  Patient ID: Angela Shepherd, female   DOB: August 31, 1950, 66 y.o.   MRN: [id number] PCP: Dr. Birdie Riddle  HPI: Angela Shepherd is a 66 y/o woman who works in patient Systems developer at [employer]. 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 PMHx also notable for HTN and prior tobacco abuse (quit 02/2010).   Was admitted in December 2011 with acute HF. Echo with EF 30-35%, There was also evidence of ASH with septum of 1.7 PW = 1.9. Diuresed and scheduled for outpt cath.  Cath 12/2009: Normal coronaries. EF 40-45%.  Central aortic pressure was 86/47 with a mean of 62.  LV pressure is 74/13 with an EDP of 2.  Right atrial pressure mean of 3.  RV pressure 26/3 with an EDP of 5.  PA pressure 28/5 with a mean of 16.  Pulmonary capillary wedge pressure mean of 7.  Fick cardiac output was 3.4 and cardiac index 2.1.   Echo in March 2012:  EF 50-55%. Grade 2 diastolic dysfunction. IVS 1.4cm.  Echo 4/14: EF 40-45%  ECHO 09/03/13: EF 40-45% Echo 4/16: EF 45-50%  Was seen by EP and noted to have a high percentage of RV pacing (99%) and it was felt this was worsening LV function. Underwent upgrade to [facility]. Jude CRT-P in 5/14.  Echo in 4/16 showed EF 45-50% with mild LVH.   She returns today for CHF follow up. Weight is stable at home, exactly the same as it was one year ago. She is very diligent about her salt intake, drinks less than 2L a day. Walks on her treadmill daily, no SOB. Continues to work at [employer] in Arch[employer]. She says that some days she feels dizzy, especially when she wears heels. Feels better when she wears flats. Denies chest pain and palpitations.   Labs (5/14): K 4, creatinine 0.88 Labs 01/13/13 K 4.3 Creatinine 0.81  Labs 05/29/13 K 4.4 Creatinine 1.0 12/12/2014: K 4.5 Creatinine 1.23   SH: Nonsmoker, works at [employer], lives in [place].   ROS: All systems negative except as listed in HPI, PMH and Problem List.  Past Medical History:    Diagnosis Date  . Bradycardia    a. initial PPM placed [facility] at [facility] with gen change 2002. b. 1*AVB & intermittent 2nd degree AVB + CHF --> upgrade to [facility]. Jude BiV PPM 05/08/12.  . Chronic systolic CHF (congestive heart failure) ([place])    a. EF 30-35% dx in 2011 -> most recent 40-45% by echo 04/2012. b. s/p upgrade to BiV-PPM 05/08/12. c. Med titration limited by hypotension.  . Cluster headache syndrome   . Hypertr obst cardiomyop   . Hypokalemia   . Hypotension   . Paroxysmal atrial fibrillation (HCC)    a. identified on PPM interrogation b. longest episode 1 hour; if burden increases, will need Angela Shepherd   . Pneumonia 2008    Current Outpatient Prescriptions  Medication Sig Dispense Refill  . albuterol (PROVENTIL HFA;VENTOLIN HFA) 108 (90 BASE) MCG/ACT inhaler Inhale 1-2 puffs into the lungs every 6 (six) hours as needed for wheezing or shortness of breath. 1 Inhaler 0  . ALPRAZolam (XANAX) 0.25 MG tablet Take 1 tablet (0.25 mg total) by mouth 2 (two) times daily as needed for anxiety. 30 tablet 3  . AMBULATORY NON FORMULARY MEDICATION Medication Name: 100% O2 15 leters a min for 15-20 mins  Via Nasal cannual 1 Bottle 2  . amitriptyline (ELAVIL) 10 MG  tablet Take 10 mg by mouth at bedtime.    Marland Kitchen aspirin 81 MG EC tablet Take 1 tablet (81 mg total) by mouth daily. 30 tablet 6  . carvedilol (COREG) 12.5 MG tablet Take 12.5 mg by mouth 2 (two) times daily with a meal.   12  . celecoxib (CELEBREX) 100 MG capsule Take 1 capsule (100 mg total) by mouth 2 (two) times daily. 60 capsule 3  . cyclobenzaprine (FLEXERIL) 5 MG tablet TAKE 1 TABLET (5 MG TOTAL) BY MOUTH 3 (THREE) TIMES DAILY AS NEEDED FOR MUSCLE SPASMS. 30 tablet 2  . escitalopram (LEXAPRO) 10 MG tablet TAKE 1 TABLET (10 MG TOTAL) BY MOUTH DAILY. 30 tablet 3  . fluticasone (FLONASE) 50 MCG/ACT nasal spray PLACE 2 SPRAYS INTO BOTH NOSTRILS DAILY. 16 g 2  . furosemide (LASIX) 80 MG tablet Take 1 tablet (80 mg total) by mouth daily. Needs office  visit 30 tablet 3  . guaiFENesin (MUCINEX) 600 MG 12 hr tablet Take 1,200 mg by mouth 2 (two) times daily as needed for cough.    Marland Kitchen ipratropium-albuterol (DUONEB) 0.5-2.5 (3) MG/3ML SOLN USE 1 VIAL (3ML) BY NEBULIZER EVERY 4 TO 6 HOURS AS NEEDED 90 mL 0  . loratadine (CLARITIN) 10 MG tablet Take 1 tablet (10 mg total) by mouth daily. 30 tablet 5  . nicotine (NICODERM CQ - DOSED IN MG/24 HOURS) 14 mg/24hr patch Place 1 patch (14 mg total) onto the skin daily. 28 patch 0  . PULMICORT FLEXHALER 180 MCG/ACT inhaler   1  . REA LO 40 40 % CREA Apply 1 application topically daily as needed. Dry skin  0  . Spacer/Aero-Holding Chambers (AEROCHAMBER MV) inhaler Use as instructed with Symbicort 1 each 0  . spironolactone (ALDACTONE) 25 MG tablet TAKE 1/2 TABLET BY MOUTH DAILY 15 tablet 6  . SUMAtriptan (IMITREX) 50 MG tablet TAKE 1 TABLET AT ONSET OF HEADACHE. MAY REPEAT IN 2 HOURS IF HEADACHE PERSISTS OR RECURS. 10 tablet 6  . SYMBICORT 160-4.5 MCG/ACT inhaler INHALE 2 PUFFS BY MOUTH INTO THE LUNGS 2 TIMES A DAY 10.2 g 3  . topiramate (TOPAMAX) 50 MG tablet Take 50 mg by mouth daily.    . valsartan (DIOVAN) 40 MG tablet TAKE 1 TABLET BY MOUTH DAILY 30 tablet 1  . Vitamin D, Ergocalciferol, (DRISDOL) 50000 units CAPS capsule Take 1 capsule (50,000 Units total) by mouth every 7 (seven) days. 12 capsule 0   No current facility-administered medications for this encounter.      PHYSICAL EXAM: Vitals:   06/01/16 1343  BP: 109/63  Pulse: 78  SpO2: 100%  Weight: 124 lb 1.9 oz (56.3 kg)    General: Female, NAD. Walked into clinic without difficulty.  HEENT: normal Neck: supple. No JVP.  Carotids 2+ bilaterally; no bruits. No lymphadenopathy or thryomegaly appreciated. Cor: PMI normal. Regular rate and rhythm. No rubs, gallops or murmurs. PPM L upper chest Lungs: Clear bilaterally. Normal effort.  Abdomen: soft, nontender, nondistended. No hepatosplenomegaly. No bruits or masses. Good bowel  sounds. Extremities: no cyanosis, clubbing, rash. No edema.  Neuro: alert & orientedx3, cranial nerves grossly intact. Moves all 4 extremities w/o difficulty. Affect pleasant.   ASSESSMENT & PLAN: 1. Chronic systolic CHF: EF 21% s/p CRT-P upgrade 05/2012 (due to chronic RV pacing)  - NYHA class I symptoms. - Volume status stable - Continue lasix 40mg  BID.  - Continue Coreg 12.5mg  BID.  - Continue spiro 12.5mg  daily.  - Continue valsartan 40mg  daily.  - She  knows to reduce her lasix to 40mg  daily if she is getting too dry.  - BMET today.  2. H/o HCM  3. HTN - Well controlled on current regimen.  4. Tobacco use quit 2012 - continues to not smoke.  5. Dizziness - She denies syncope and presyncope. No dizziness or weakness when going from sitting to standing.  - Advised her to talk to her PCP about this.   BMET today. Follow up in 6 months.    Arbutus Leas, NP-C 06/01/2016

## 2016-06-06 ENCOUNTER — Encounter: Payer: Self-pay | Admitting: Family

## 2016-06-06 ENCOUNTER — Ambulatory Visit (INDEPENDENT_AMBULATORY_CARE_PROVIDER_SITE_OTHER): Payer: 59 | Admitting: Family

## 2016-06-06 ENCOUNTER — Ambulatory Visit (HOSPITAL_BASED_OUTPATIENT_CLINIC_OR_DEPARTMENT_OTHER)
Admission: RE | Admit: 2016-06-06 | Discharge: 2016-06-06 | Disposition: A | Discharge status: Home / Self Care | Payer: 59 | Source: Ambulatory Visit | Attending: Family | Admitting: Family

## 2016-06-06 ENCOUNTER — Telehealth: Payer: Self-pay | Admitting: *Deleted

## 2016-06-06 VITALS — BP 102/58 | HR 69 | Temp 97.7°F | Resp 16 | Ht 61.0 in | Wt 124.6 lb

## 2016-06-06 DIAGNOSIS — X58XXXA Exposure to other specified factors, initial encounter: Secondary | ICD-10-CM | POA: Insufficient documentation

## 2016-06-06 DIAGNOSIS — S90852A Superficial foreign body, left foot, initial encounter: Secondary | ICD-10-CM | POA: Insufficient documentation

## 2016-06-06 DIAGNOSIS — M79672 Pain in left foot: Secondary | ICD-10-CM

## 2016-06-06 DIAGNOSIS — M7732 Calcaneal spur, left foot: Secondary | ICD-10-CM | POA: Insufficient documentation

## 2016-06-06 LAB — CBC WITH DIFFERENTIAL/PLATELET
BASOS PCT: 1.2 % (ref 0.0–3.0)
Basophils Absolute: 0.1 10*3/uL (ref 0.0–0.1)
EOS ABS: 0.2 10*3/uL (ref 0.0–0.7)
Eosinophils Relative: 2.5 % (ref 0.0–5.0)
HCT: 39.6 % (ref 36.0–46.0)
HEMOGLOBIN: 13.5 g/dL (ref 12.0–15.0)
Lymphocytes Relative: 25.4 % (ref 12.0–46.0)
Lymphs Abs: 1.9 10*3/uL (ref 0.7–4.0)
MCHC: 34.2 g/dL (ref 30.0–36.0)
MCV: 96.4 fl (ref 78.0–100.0)
MONO ABS: 0.6 10*3/uL (ref 0.1–1.0)
Monocytes Relative: 8.2 % (ref 3.0–12.0)
NEUTROS PCT: 62.7 % (ref 43.0–77.0)
Neutro Abs: 4.7 10*3/uL (ref 1.4–7.7)
PLATELETS: 183 10*3/uL (ref 150.0–400.0)
RBC: 4.11 Mil/uL (ref 3.87–5.11)
RDW: 13 % (ref 11.5–15.5)
WBC: 7.5 10*3/uL (ref 4.0–10.5)

## 2016-06-06 LAB — URIC ACID: URIC ACID, SERUM: 10.9 mg/dL — AB (ref 2.4–7.0)

## 2016-06-06 MED ORDER — COLCHICINE 0.6 MG PO TABS: ORAL_TABLET | ORAL | 0 refills | Status: DC

## 2016-06-06 MED ORDER — TRAMADOL HCL 50 MG PO TABS: 50.0000 mg | ORAL_TABLET | Freq: Three times a day (TID) | ORAL | 0 refills | Status: DC | PRN

## 2016-06-06 MED FILL — COLCHICINE 0.6 MG TABLET: 0.6 | 2 days supply | Qty: 6 | Fill #0

## 2016-06-06 MED FILL — traMADol HCL 50 MG TABS: 50 | 5 days supply | Qty: 15 | Fill #0

## 2016-06-06 NOTE — Telephone Encounter (Signed)
Please contact patient and let her know that I reviewed x ray. It looks like a small splinter in the bottom of her foot.  I suspect that it has been there a long time and is unrelated to her current foot pain. I would like to see how her uric acid level looks and will let her know.

## 2016-06-06 NOTE — Telephone Encounter (Signed)
Please contact patient and let her know that her uric acid level is elevated. Consistent with gout.  I would like her to take colchicine 2 tabs now and 1 tab in 1 hour. This will be followed by brief diarrhea. Then pain should improve. If pain not improved tomorrow, may repeat 2 tabs then 1 tab.  I don't think that the finding on xray is clinically significant.

## 2016-06-06 NOTE — Telephone Encounter (Signed)
Left message for pt to return my call.

## 2016-06-06 NOTE — Progress Notes (Signed)
Subjective:    Patient ID: Angela Shepherd, female    DOB: 1950/11/20, 66 y.o.   MRN: [id number]  HPI  Angela Shepherd is a 66 yr old female who presents today with chief complaint of left sided foot pain. Pain began this AM.  Pain is worse with standing/walking.  She does believe that she had an episode of gout "In my thumb years ago."  Denies known injury.   Review of Systems See HPI  Past Medical History:  Diagnosis Date  . Bradycardia    a. initial PPM placed 1994 at [facility] with gen change 2002. b. 1*AVB & intermittent 2nd degree AVB + CHF --> upgrade to [facility]. Jude BiV PPM 05/08/12.  . Chronic systolic CHF (congestive heart failure) ([place])    a. EF 30-35% dx in 2011 -> most recent 40-45% by echo 04/2012. b. s/p upgrade to BiV-PPM 05/08/12. c. Med titration limited by hypotension.  . Cluster headache syndrome   . Hypertr obst cardiomyop   . Hypokalemia   . Hypotension   . Paroxysmal atrial fibrillation (HCC)    a. identified on PPM interrogation b. longest episode 1 hour; if burden increases, will need [place]   . Pneumonia 2008     Social History   Social History  . Marital status: Married    Spouse name: N/A  . Number of children: 2  . Years of education: N/A   Occupational History  . [employer]   Social History Main Topics  . Smoking status: Former Smoker    Packs/day: 3.00  . Smokeless tobacco: Never Used  . Alcohol use 1.2 oz/week    2 Glasses of wine per week  . Drug use: No  . Sexual activity: No   Other Topics Concern  . Not on file   Social History Narrative   ** Merged History Encounter **        Past Surgical History:  Procedure Laterality Date  . BI-VENTRICULAR PACEMAKER UPGRADE N/A 05/08/2012   upgrade to SJM Anthem (CRT-P) by Dr Rayann Heman  . PACEMAKER INSERTION  1994; 2002; 05/08/2012  . TONSILLECTOMY  ~ 1959  . TUBAL LIGATION  1984?    Family History  Problem Relation Age of Onset  . Diabetes Unknown   . Cardiomyopathy Sister     . Heart disease Sister   . Colon cancer Neg Hx   . Esophageal cancer Neg Hx   . Stomach cancer Neg Hx   . Rectal cancer Neg Hx   . Cancer Sister 57       uterine  . Dementia Mother   . Diabetes Brother     Allergies  Allergen Reactions  . Prednisone Other (See Comments)    SOB, CHF     Current Outpatient Prescriptions on File Prior to Visit  Medication Sig Dispense Refill  . albuterol (PROVENTIL HFA;VENTOLIN HFA) 108 (90 BASE) MCG/ACT inhaler Inhale 1-2 puffs into the lungs every 6 (six) hours as needed for wheezing or shortness of breath. 1 Inhaler 0  . ALPRAZolam (XANAX) 0.25 MG tablet Take 1 tablet (0.25 mg total) by mouth 2 (two) times daily as needed for anxiety. 30 tablet 3  . AMBULATORY NON FORMULARY MEDICATION Medication Name: 100% O2 15 leters a min for 15-20 mins  Via Nasal cannual 1 Bottle 2  . amitriptyline (ELAVIL) 10 MG tablet Take 10 mg by mouth at bedtime.    . carvedilol (COREG) 12.5 MG tablet Take 12.5 mg by mouth 2 (  two) times daily with a meal.   12  . cyclobenzaprine (FLEXERIL) 5 MG tablet TAKE 1 TABLET (5 MG TOTAL) BY MOUTH 3 (THREE) TIMES DAILY AS NEEDED FOR MUSCLE SPASMS. 30 tablet 2  . escitalopram (LEXAPRO) 10 MG tablet TAKE 1 TABLET (10 MG TOTAL) BY MOUTH DAILY. (Patient taking differently: TAKE 1 TABLET (10 MG TOTAL) BY MOUTH DAILY. As needed.) 30 tablet 3  . fluticasone (FLONASE) 50 MCG/ACT nasal spray PLACE 2 SPRAYS INTO BOTH NOSTRILS DAILY. 16 g 2  . furosemide (LASIX) 80 MG tablet Take 0.5 tablets (40 mg total) by mouth 2 (two) times daily. 30 tablet 3  . guaiFENesin (MUCINEX) 600 MG 12 hr tablet Take 1,200 mg by mouth 2 (two) times daily as needed for cough.    Marland Kitchen ipratropium-albuterol (DUONEB) 0.5-2.5 (3) MG/3ML SOLN USE 1 VIAL (3ML) BY NEBULIZER EVERY 4 TO 6 HOURS AS NEEDED 90 mL 0  . loratadine (CLARITIN) 10 MG tablet Take 1 tablet (10 mg total) by mouth daily. 30 tablet 5  . nicotine (NICODERM CQ - DOSED IN MG/24 HOURS) 14 mg/24hr patch Place 1  patch (14 mg total) onto the skin daily. 28 patch 0  . PULMICORT FLEXHALER 180 MCG/ACT inhaler   1  . REA LO 40 40 % CREA Apply 1 application topically daily as needed. Dry skin  0  . Spacer/Aero-Holding Chambers (AEROCHAMBER MV) inhaler Use as instructed with Symbicort 1 each 0  . spironolactone (ALDACTONE) 25 MG tablet TAKE 1/2 TABLET BY MOUTH DAILY 15 tablet 6  . SUMAtriptan (IMITREX) 50 MG tablet TAKE 1 TABLET AT ONSET OF HEADACHE. MAY REPEAT IN 2 HOURS IF HEADACHE PERSISTS OR RECURS. 10 tablet 6  . SYMBICORT 160-4.5 MCG/ACT inhaler INHALE 2 PUFFS BY MOUTH INTO THE LUNGS 2 TIMES A DAY (Patient taking differently: INHALE 2 PUFFS BY MOUTH INTO THE LUNGS 2 TIMES A DAY as needed) 10.2 g 3  . topiramate (TOPAMAX) 50 MG tablet Take 50 mg by mouth daily.    . valsartan (DIOVAN) 40 MG tablet TAKE 1 TABLET BY MOUTH DAILY 30 tablet 1  . aspirin 81 MG EC tablet Take 1 tablet (81 mg total) by mouth daily. (Patient not taking: Reported on 06/06/2016) 30 tablet 6   No current facility-administered medications on file prior to visit.     BP (!) 102/58 (BP Location: Left Arm, Cuff Size: Normal)   Pulse 69   Temp 97.7 F (36.5 C) (Oral)   Resp 16   Ht 5\' 1"  (1.549 m)   Wt 124 lb 9.6 oz (56.5 kg)   LMP  (LMP Unknown)   SpO2 99% Comment: room air  BMI 23.54 kg/m       Objective:   Physical Exam  Constitutional: She appears well-developed and well-nourished.  Cardiovascular: Normal rate, regular rhythm and normal heart sounds.   No murmur heard. Pulmonary/Chest: Effort normal and breath sounds normal. No respiratory distress. She has no wheezes.  Musculoskeletal:  Left foot is without erythema or swelling.   Psychiatric: She has a normal mood and affect. Her behavior is normal. Judgment and thought content normal.          Assessment & Plan:  Left foot pain- will obtain xray, CBC, and uric acid level. Suspect gout flare.  Would avoid nsaids due to renal insufficiency. Rx provided for prn  tramadol. She understands not to take elavil with tramadol due to potential drug interaction. If uric acid level is elevated will plan rx for gout.

## 2016-06-06 NOTE — Telephone Encounter (Signed)
Notified pt and she voices understanding. 

## 2016-06-06 NOTE — Telephone Encounter (Signed)
Received call from [facility] that left foot xray result is now final. Please advise?

## 2016-06-06 NOTE — Patient Instructions (Signed)
Don't take elavil with tramadol.  Complete xray on the first floor.  Call if pain worsens or if pain does not improve.

## 2016-06-07 ENCOUNTER — Encounter (HOSPITAL_COMMUNITY): Payer: Self-pay | Admitting: *Deleted

## 2016-06-07 NOTE — Progress Notes (Signed)
Received paperwork to continue FMLA from [employer] on April 05, 2016.  Patient had not been seen in over a year at this time.  Spoke with patient at the time and made her aware that she needed to be seen before we can complete paperwork.  Patient was seen in clinic by Dr. Haroldine Laws on Jun 01, 2016.  Paperwork completed and faxed today to [phone].  Original request will be scanned to patient's electronic medical record.

## 2016-06-08 ENCOUNTER — Telehealth: Payer: Self-pay | Admitting: Family Medicine

## 2016-06-08 DIAGNOSIS — M109 Gout, unspecified: Secondary | ICD-10-CM

## 2016-06-08 MED ORDER — HYDROCODONE-ACETAMINOPHEN 5-325 MG PO TABS: 1.0000 | ORAL_TABLET | Freq: Four times a day (QID) | ORAL | 0 refills | Status: DC | PRN

## 2016-06-08 NOTE — Telephone Encounter (Signed)
Stop tramadol, start prn hydrocodone. (don't drive after taking). I will try to get her in to ortho on Monday. See rx for hydrocodone.

## 2016-06-08 NOTE — Telephone Encounter (Signed)
Notified pt and she voices understanding. Rx placed at front desk for pick up.

## 2016-06-08 NOTE — Telephone Encounter (Signed)
Spoke with pt. States she has taken both doses of colchicine and continues to take tramadol with no improvement in pain. Pt states pain is excruciating.  Please advise?

## 2016-06-08 NOTE — Telephone Encounter (Signed)
Relation to XY:BFXO Call back number:[phone] Pharmacy: [place], [place] - [address] [phone] (Phone) [phone] (Fax)     Reason for call:  Patient states colchicine 0.6 MG tablet is not working and with the weekend approaching in need of clinical advice, please advise

## 2016-06-09 DIAGNOSIS — M76822 Posterior tibial tendinitis, left leg: Secondary | ICD-10-CM | POA: Diagnosis not present

## 2016-06-11 ENCOUNTER — Other Ambulatory Visit (HOSPITAL_COMMUNITY): Payer: Self-pay | Admitting: Internal Medicine

## 2016-06-11 MED FILL — CARVEDILOL 12.5 MG TABLET: 12.5 | 30 days supply | Qty: 60 | Fill #0

## 2016-06-11 MED FILL — SPIRONOLACTONE 25 MG TABLET: 25 | 30 days supply | Qty: 15 | Fill #4

## 2016-06-11 MED FILL — HYDROCODON-APAP 5-325: 5-325 | 5 days supply | Qty: 20 | Fill #0

## 2016-06-12 ENCOUNTER — Ambulatory Visit (INDEPENDENT_AMBULATORY_CARE_PROVIDER_SITE_OTHER): Payer: 59 | Admitting: Family Medicine

## 2016-06-12 ENCOUNTER — Encounter: Payer: Self-pay | Admitting: Family Medicine

## 2016-06-12 DIAGNOSIS — M79672 Pain in left foot: Secondary | ICD-10-CM | POA: Diagnosis not present

## 2016-06-12 MED ORDER — COLCHICINE 0.6 MG PO TABS
0.6000 mg | ORAL_TABLET | Freq: Two times a day (BID) | ORAL | 0 refills | Status: DC
Start: 2016-06-12 — End: 2016-07-27

## 2016-06-12 MED ORDER — DICLOFENAC SODIUM 1 % TD GEL: 2.0000 g | Freq: Four times a day (QID) | TRANSDERMAL | 0 refills | Status: DC

## 2016-06-12 MED FILL — COLCHICINE 0.6 MG TABLET: 0.6 | 30 days supply | Qty: 60 | Fill #0

## 2016-06-12 MED FILL — DICLOFENAC SODIUM 1% GEL: 1 | 38 days supply | Qty: 300 | Fill #0

## 2016-06-12 NOTE — Patient Instructions (Signed)
You have an acute gout flare at your tarso-metatarsal joint. Your ultrasound is otherwise normal along with your x-rays. Take the colchicine twice a day until the flare has subsided. Apply voltaren gel up to 4 times a day as well Icing 15 minutes at a time 3-4 times a day if tolerate. Elevate above your heart as much as possible. You cannot take prednisone or the oral anti-inflammatories because of allergy, kidney, and heart issues. These can take 3-4 weeks to go away. Follow up with me in 2 weeks for reevaluation. Talk about allopurinol with Dr. Birdie Riddle at your appointment in a month - you don't start this when you have an acute flare-up though.

## 2016-06-13 DIAGNOSIS — M79672 Pain in left foot: Secondary | ICD-10-CM | POA: Insufficient documentation

## 2016-06-13 NOTE — Assessment & Plan Note (Signed)
with swelling, no trauma.  Independently reviewed radiographs and performed ultrasound - no evidence stress fracture or other bony abnormalities.  Consistent with acute gout flare.  Unfortunately she's allergic to prednisone and with history of CHF would not prescribe oral NSAID.  She will take colchicine regularly, apply voltaren gel up to 4 times a day.  Icing, elevation.  Would not tolerate cam walker.  We discussed cortisone injection - do not think she would tolerate this given severity of pain with even light touch.  Follow up in 2 weeks.  Consider allopurinol once acute flare has resolved.

## 2016-06-13 NOTE — Progress Notes (Signed)
PCP: Midge Minium, MD Consultation requested by: Debbrah Alar NP  Subjective:   HPI: Patient is a 66 y.o. female here for left foot pain.  Patient reports about 1 week ago she woke up with severe pain left foot dorsally and swelling. Some warmth to this. Pain level is 7/10 and sharp - hurts to have anything even lightly touch this. Remotely she believes she had a single gout flare. She has tried colchicine for a couple days but not noticed a difference. She's allergic to prednisone and has CHF, a-fib. No other skin changes, rash, numbness.  Past Medical History:  Diagnosis Date  . Bradycardia    a. initial PPM placed 1994 at [facility] with gen change 2002. b. 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 Jude BiV PPM 05/08/12.  . Chronic systolic CHF (congestive heart failure) ([place])    a. EF 30-35% dx in 2011 -> most recent 40-45% by echo 04/2012. b. s/p upgrade to BiV-PPM 05/08/12. c. Med titration limited by hypotension.  . Cluster headache syndrome   . Hypertr obst cardiomyop   . Hypokalemia   . Hypotension   . Paroxysmal atrial fibrillation (HCC)    a. identified on PPM interrogation b. longest episode 1 hour; if burden increases, will need [place]   . Pneumonia 2008    Current Outpatient Prescriptions on File Prior to Visit  Medication Sig Dispense Refill  . albuterol (PROVENTIL HFA;VENTOLIN HFA) 108 (90 BASE) MCG/ACT inhaler Inhale 1-2 puffs into the lungs every 6 (six) hours as needed for wheezing or shortness of breath. 1 Inhaler 0  . ALPRAZolam (XANAX) 0.25 MG tablet Take 1 tablet (0.25 mg total) by mouth 2 (two) times daily as needed for anxiety. 30 tablet 3  . AMBULATORY NON FORMULARY MEDICATION Medication Name: 100% O2 15 leters a min for 15-20 mins  Via Nasal cannual 1 Bottle 2  . amitriptyline (ELAVIL) 10 MG tablet Take 10 mg by mouth at bedtime.    Marland Kitchen aspirin 81 MG EC tablet Take 1 tablet (81 mg total) by mouth daily. (Patient not taking: Reported  on 06/06/2016) 30 tablet 6  . carvedilol (COREG) 12.5 MG tablet Take 12.5 mg by mouth 2 (two) times daily with a meal.   12  . carvedilol (COREG) 12.5 MG tablet TAKE 1 TABLET (12.5 MG TOTAL) BY MOUTH 2 (TWO) TIMES DAILY WITH A MEAL. 60 tablet 3  . cyclobenzaprine (FLEXERIL) 5 MG tablet TAKE 1 TABLET (5 MG TOTAL) BY MOUTH 3 (THREE) TIMES DAILY AS NEEDED FOR MUSCLE SPASMS. 30 tablet 2  . escitalopram (LEXAPRO) 10 MG tablet TAKE 1 TABLET (10 MG TOTAL) BY MOUTH DAILY. (Patient taking differently: TAKE 1 TABLET (10 MG TOTAL) BY MOUTH DAILY. As needed.) 30 tablet 3  . fluticasone (FLONASE) 50 MCG/ACT nasal spray PLACE 2 SPRAYS INTO BOTH NOSTRILS DAILY. 16 g 2  . furosemide (LASIX) 80 MG tablet Take 0.5 tablets (40 mg total) by mouth 2 (two) times daily. 30 tablet 3  . guaiFENesin (MUCINEX) 600 MG 12 hr tablet Take 1,200 mg by mouth 2 (two) times daily as needed for cough.    Marland Kitchen HYDROcodone-acetaminophen (NORCO/VICODIN) 5-325 MG tablet Take 1 tablet by mouth every 6 (six) hours as needed for moderate pain. 20 tablet 0  . ipratropium-albuterol (DUONEB) 0.5-2.5 (3) MG/3ML SOLN USE 1 VIAL (3ML) BY NEBULIZER EVERY 4 TO 6 HOURS AS NEEDED 90 mL 0  . loratadine (CLARITIN) 10 MG tablet Take 1 tablet (10 mg total)  by mouth daily. 30 tablet 5  . nicotine (NICODERM CQ - DOSED IN MG/24 HOURS) 14 mg/24hr patch Place 1 patch (14 mg total) onto the skin daily. 28 patch 0  . PULMICORT FLEXHALER 180 MCG/ACT inhaler   1  . REA LO 40 40 % CREA Apply 1 application topically daily as needed. Dry skin  0  . Spacer/Aero-Holding Chambers (AEROCHAMBER MV) inhaler Use as instructed with Symbicort 1 each 0  . spironolactone (ALDACTONE) 25 MG tablet TAKE 1/2 TABLET BY MOUTH DAILY 15 tablet 6  . SUMAtriptan (IMITREX) 50 MG tablet TAKE 1 TABLET AT ONSET OF HEADACHE. MAY REPEAT IN 2 HOURS IF HEADACHE PERSISTS OR RECURS. 10 tablet 6  . SYMBICORT 160-4.5 MCG/ACT inhaler INHALE 2 PUFFS BY MOUTH INTO THE LUNGS 2 TIMES A DAY (Patient taking  differently: INHALE 2 PUFFS BY MOUTH INTO THE LUNGS 2 TIMES A DAY as needed) 10.2 g 3  . topiramate (TOPAMAX) 50 MG tablet Take 50 mg by mouth daily.    . traMADol (ULTRAM) 50 MG tablet Take 1 tablet (50 mg total) by mouth every 8 (eight) hours as needed. 15 tablet 0  . valsartan (DIOVAN) 40 MG tablet TAKE 1 TABLET BY MOUTH DAILY 30 tablet 1   No current facility-administered medications on file prior to visit.     Past Surgical History:  Procedure Laterality Date  . BI-VENTRICULAR PACEMAKER UPGRADE N/A 05/08/2012   upgrade to SJM Anthem (CRT-P) by Dr Rayann Heman  . PACEMAKER INSERTION  1994; 2002; 05/08/2012  . TONSILLECTOMY  ~ 1959  . TUBAL LIGATION  1984?    Allergies  Allergen Reactions  . Prednisone Other (See Comments)    SOB, CHF     Social History   Social History  . Marital status: Married    Spouse name: N/A  . Number of children: 2  . Years of education: N/A   Occupational History  . [employer]   Social History Main Topics  . Smoking status: Former Smoker    Packs/day: 3.00  . Smokeless tobacco: Never Used  . Alcohol use 1.2 oz/week    2 Glasses of wine per week  . Drug use: No  . Sexual activity: No   Other Topics Concern  . Not on file   Social History Narrative   ** Merged History Encounter **        Family History  Problem Relation Age of Onset  . Diabetes Unknown   . Cardiomyopathy Sister   . Heart disease Sister   . Colon cancer Neg Hx   . Esophageal cancer Neg Hx   . Stomach cancer Neg Hx   . Rectal cancer Neg Hx   . Cancer Sister 38       uterine  . Dementia Mother   . Diabetes Brother     BP 103/72   Pulse 77   Ht 5\' 1"  (1.549 m)   Wt 120 lb (54.4 kg)   LMP  (LMP Unknown)   BMI 22.67 kg/m   Review of Systems: See HPI above.     Objective:  Physical Exam:  Gen: NAD, comfortable in exam room  Left foot/ankle: Swelling but no ecchymoses of dorsal left foot.  No ankle swelling.  No other  deformity. FROM ankle without pain.  No pain with toe motions. Severe tenderness medially over 1st TMT joint Negative ant drawer and talar tilt.   Negative syndesmotic compression. Pain with metatarsal compression. Thompsons test negative. NV intact  distally.   Assessment & Plan:  1. Left foot pain - with swelling, no trauma.  Independently reviewed radiographs and performed ultrasound - no evidence stress fracture or other bony abnormalities.  Consistent with acute gout flare.  Unfortunately she's allergic to prednisone and with history of CHF would not prescribe oral NSAID.  She will take colchicine regularly, apply voltaren gel up to 4 times a day.  Icing, elevation.  Would not tolerate cam walker.  We discussed cortisone injection - do not think she would tolerate this given severity of pain with even light touch.  Follow up in 2 weeks.  Consider allopurinol once acute flare has resolved.

## 2016-06-19 ENCOUNTER — Telehealth: Payer: Self-pay | Admitting: *Deleted

## 2016-06-19 NOTE — Telephone Encounter (Signed)
Received fax from [employer] for FMLA. Unable to reach pt by phone. Sent mychart message for additional information regarding request. Forms placed in blue folder on CMA desk.

## 2016-06-20 NOTE — Telephone Encounter (Signed)
Angela Shepherd-- Pt is requesting FMLA for a 7 day time period (and in case of future occurrences) due to the difficulty / ?gout she was having with her foot. Can we fill that out or does it need go to Dr Barbaraann Barthel?

## 2016-06-20 NOTE — Telephone Encounter (Signed)
I will complete

## 2016-06-21 ENCOUNTER — Encounter: Payer: Self-pay | Admitting: Family Medicine

## 2016-06-21 ENCOUNTER — Ambulatory Visit (INDEPENDENT_AMBULATORY_CARE_PROVIDER_SITE_OTHER): Payer: 59 | Admitting: Family Medicine

## 2016-06-21 DIAGNOSIS — M25551 Pain in right hip: Secondary | ICD-10-CM | POA: Diagnosis not present

## 2016-06-21 MED FILL — VALSARTAN 40 MG TABLET: 40 | 30 days supply | Qty: 30 | Fill #1

## 2016-06-21 NOTE — Patient Instructions (Addendum)
You have strained your hip external rotators. Ice the area 15 minutes at a time 3-4 times a day. Standing hip rotations, side leg raises 3 sets of 10 once a day. When these become too easy can add an ankle weight. Stretches - pick 2-3 and do them twice a day - hold for 20-30 seconds, repeat 3 times. Voltaren gel over the hip up to 4 times a day. Call me if you want to do physical therapy for this. Follow up with me in 1 month to 6 weeks.

## 2016-06-22 ENCOUNTER — Ambulatory Visit: Payer: 59 | Admitting: Family Medicine

## 2016-06-22 NOTE — Telephone Encounter (Signed)
Form initiated and placed in PCP red folder for completion and signature.

## 2016-06-25 NOTE — Telephone Encounter (Signed)
Forms faxed to [employer] at [phone]. Email sent to pt.

## 2016-06-25 NOTE — Telephone Encounter (Signed)
Form completed.

## 2016-06-26 ENCOUNTER — Ambulatory Visit: Payer: 59 | Admitting: Family Medicine

## 2016-06-28 DIAGNOSIS — M25551 Pain in right hip: Secondary | ICD-10-CM | POA: Insufficient documentation

## 2016-06-28 NOTE — Assessment & Plan Note (Signed)
consistent with hip external rotator strain.  Icing, shown home exercises and stretches to do daily.  Voltaren gel.  Consider physical therapy, radiographs.  F/u in 1 month to 6 weeks.

## 2016-06-28 NOTE — Progress Notes (Signed)
PCP: Midge Minium, MD Consultation requested by: Debbrah Alar NP  Subjective:   HPI: Patient is a 66 y.o. female here for left foot, right hip pain.  6/5: Patient reports about 1 week ago she woke up with severe pain left foot dorsally and swelling. Some warmth to this. Pain level is 7/10 and sharp - hurts to have anything even lightly touch this. Remotely she believes she had a single gout flare. She has tried colchicine for a couple days but not noticed a difference. She's allergic to prednisone and has CHF, a-fib. No other skin changes, rash, numbness.  6/14: Patient reports her foot is completely better following colchicine, voltaren gel, icing. She has had right hip pain since December. Feels like there's a rod running down posterior hip through leg. Pain worse with prolonged sitting, walking, at nighttime. Pain is posterior and deep. No numbness, tingling. No skin changes. Pain level up to 5/10, sharp. No back pain.  Past Medical History:  Diagnosis Date  . Bradycardia    a. initial PPM placed 1994 at [facility] with gen change 2002. b. 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 Jude BiV PPM 05/08/12.  . Chronic systolic CHF (congestive heart failure) ([place])    a. EF 30-35% dx in 2011 -> most recent 40-45% by echo 04/2012. b. s/p upgrade to BiV-PPM 05/08/12. c. Med titration limited by hypotension.  . Cluster headache syndrome   . Hypertr obst cardiomyop   . Hypokalemia   . Hypotension   . Paroxysmal atrial fibrillation (HCC)    a. identified on PPM interrogation b. longest episode 1 hour; if burden increases, will need [place]   . Pneumonia 2008    Current Outpatient Prescriptions on File Prior to Visit  Medication Sig Dispense Refill  . albuterol (PROVENTIL HFA;VENTOLIN HFA) 108 (90 BASE) MCG/ACT inhaler Inhale 1-2 puffs into the lungs every 6 (six) hours as needed for wheezing or shortness of breath. 1 Inhaler 0  . ALPRAZolam (XANAX) 0.25 MG  tablet Take 1 tablet (0.25 mg total) by mouth 2 (two) times daily as needed for anxiety. 30 tablet 3  . AMBULATORY NON FORMULARY MEDICATION Medication Name: 100% O2 15 leters a min for 15-20 mins  Via Nasal cannual 1 Bottle 2  . amitriptyline (ELAVIL) 10 MG tablet Take 10 mg by mouth at bedtime.    Marland Kitchen aspirin 81 MG EC tablet Take 1 tablet (81 mg total) by mouth daily. (Patient not taking: Reported on 06/06/2016) 30 tablet 6  . carvedilol (COREG) 12.5 MG tablet Take 12.5 mg by mouth 2 (two) times daily with a meal.   12  . carvedilol (COREG) 12.5 MG tablet TAKE 1 TABLET (12.5 MG TOTAL) BY MOUTH 2 (TWO) TIMES DAILY WITH A MEAL. 60 tablet 3  . colchicine 0.6 MG tablet Take 1 tablet (0.6 mg total) by mouth 2 (two) times daily. 60 tablet 0  . cyclobenzaprine (FLEXERIL) 5 MG tablet TAKE 1 TABLET (5 MG TOTAL) BY MOUTH 3 (THREE) TIMES DAILY AS NEEDED FOR MUSCLE SPASMS. 30 tablet 2  . diclofenac sodium (VOLTAREN) 1 % GEL Apply 2 g topically 4 (four) times daily. 3 Tube 0  . escitalopram (LEXAPRO) 10 MG tablet TAKE 1 TABLET (10 MG TOTAL) BY MOUTH DAILY. (Patient taking differently: TAKE 1 TABLET (10 MG TOTAL) BY MOUTH DAILY. As needed.) 30 tablet 3  . fluticasone (FLONASE) 50 MCG/ACT nasal spray PLACE 2 SPRAYS INTO BOTH NOSTRILS DAILY. 16 g 2  . furosemide (  LASIX) 80 MG tablet Take 0.5 tablets (40 mg total) by mouth 2 (two) times daily. 30 tablet 3  . guaiFENesin (MUCINEX) 600 MG 12 hr tablet Take 1,200 mg by mouth 2 (two) times daily as needed for cough.    Marland Kitchen HYDROcodone-acetaminophen (NORCO/VICODIN) 5-325 MG tablet Take 1 tablet by mouth every 6 (six) hours as needed for moderate pain. 20 tablet 0  . ipratropium-albuterol (DUONEB) 0.5-2.5 (3) MG/3ML SOLN USE 1 VIAL (3ML) BY NEBULIZER EVERY 4 TO 6 HOURS AS NEEDED 90 mL 0  . loratadine (CLARITIN) 10 MG tablet Take 1 tablet (10 mg total) by mouth daily. 30 tablet 5  . nicotine (NICODERM CQ - DOSED IN MG/24 HOURS) 14 mg/24hr patch Place 1 patch (14 mg total) onto  the skin daily. 28 patch 0  . PULMICORT FLEXHALER 180 MCG/ACT inhaler   1  . REA LO 40 40 % CREA Apply 1 application topically daily as needed. Dry skin  0  . Spacer/Aero-Holding Chambers (AEROCHAMBER MV) inhaler Use as instructed with Symbicort 1 each 0  . spironolactone (ALDACTONE) 25 MG tablet TAKE 1/2 TABLET BY MOUTH DAILY 15 tablet 6  . SUMAtriptan (IMITREX) 50 MG tablet TAKE 1 TABLET AT ONSET OF HEADACHE. MAY REPEAT IN 2 HOURS IF HEADACHE PERSISTS OR RECURS. 10 tablet 6  . SYMBICORT 160-4.5 MCG/ACT inhaler INHALE 2 PUFFS BY MOUTH INTO THE LUNGS 2 TIMES A DAY (Patient taking differently: INHALE 2 PUFFS BY MOUTH INTO THE LUNGS 2 TIMES A DAY as needed) 10.2 g 3  . topiramate (TOPAMAX) 50 MG tablet Take 50 mg by mouth daily.    . traMADol (ULTRAM) 50 MG tablet Take 1 tablet (50 mg total) by mouth every 8 (eight) hours as needed. 15 tablet 0  . valsartan (DIOVAN) 40 MG tablet TAKE 1 TABLET BY MOUTH DAILY 30 tablet 1   No current facility-administered medications on file prior to visit.     Past Surgical History:  Procedure Laterality Date  . BI-VENTRICULAR PACEMAKER UPGRADE N/A 05/08/2012   upgrade to S[facility] Anthem (CRT-P) by Dr Rayann Heman  . PACEMAKER INSERTION  1994; 2002; 05/08/2012  . TONSILLECTOMY  ~ 1959  . TUBAL LIGATION  1984?    Allergies  Allergen Reactions  . Prednisone Other (See Comments)    SOB, CHF     Social History   Social History  . Marital status: Married    Spouse name: N/A  . Number of children: 2  . Years of education: N/A   Occupational History  . [employer]   Social History Main Topics  . Smoking status: Former Smoker    Packs/day: 3.00  . Smokeless tobacco: Never Used  . Alcohol use 1.2 oz/week    2 Glasses of wine per week  . Drug use: No  . Sexual activity: No   Other Topics Concern  . Not on file   Social History Narrative   ** Merged History Encounter **        Family History  Problem Relation Age of Onset  .  Diabetes Unknown   . Cardiomyopathy Sister   . Heart disease Sister   . Colon cancer Neg Hx   . Esophageal cancer Neg Hx   . Stomach cancer Neg Hx   . Rectal cancer Neg Hx   . Cancer Sister 75       uterine  . Dementia Mother   . Diabetes Brother     BP 101/68   Pulse 66  Ht 5\' 2"  (1.575 m)   Wt 120 lb (54.4 kg)   LMP  (LMP Unknown)   BMI 21.95 kg/m   Review of Systems: See HPI above.     Objective:  Physical Exam:  Gen: NAD, comfortable in exam room  Back/right hip: No gross deformity, scoliosis. TTP right buttock over external rotators.  No trochanter, back, other tenderness. FROM with pain on external rotation of hip. Strength LEs 5/5 all muscle groups. Negative SLRs. Sensation intact to light touch bilaterally. Negative logroll bilateral hips Negative fabers and piriformis stretches.  Assessment & Plan:  1. Right hip pain - consistent with hip external rotator strain.  Icing, shown home exercises and stretches to do daily.  Voltaren gel.  Consider physical therapy, radiographs.  F/u in 1 month to 6 weeks.  2. Left foot pain - 2/2 acute gout flare.  Resolved with colchicine, voltaren gel, icing.  Consider allopurinol.

## 2016-07-03 ENCOUNTER — Other Ambulatory Visit: Payer: Self-pay | Admitting: Emergency Medicine

## 2016-07-03 DIAGNOSIS — J449 Chronic obstructive pulmonary disease, unspecified: Secondary | ICD-10-CM

## 2016-07-04 ENCOUNTER — Ambulatory Visit (INDEPENDENT_AMBULATORY_CARE_PROVIDER_SITE_OTHER): Payer: 59 | Admitting: Emergency Medicine

## 2016-07-04 ENCOUNTER — Ambulatory Visit: Payer: 59 | Admitting: Emergency Medicine

## 2016-07-04 DIAGNOSIS — J449 Chronic obstructive pulmonary disease, unspecified: Secondary | ICD-10-CM | POA: Diagnosis not present

## 2016-07-04 LAB — PULMONARY FUNCTION TEST
DL/VA % pred: 90 %
DL/VA: 3.84 ml/min/mmHg/L
DLCO COR: 13.37 ml/min/mmHg
DLCO UNC: 12.85 ml/min/mmHg
DLCO cor % pred: 70 %
DLCO unc % pred: 68 %
FEF 25-75 Post: 0.59 L/sec
FEF 25-75 Pre: 0.52 L/sec
FEF2575-%Change-Post: 13 %
FEF2575-%PRED-PRE: 27 %
FEF2575-%Pred-Post: 30 %
FEV1-%Change-Post: 7 %
FEV1-%PRED-PRE: 57 %
FEV1-%Pred-Post: 62 %
FEV1-POST: 1.28 L
FEV1-Pre: 1.19 L
FEV1FVC-%Change-Post: 9 %
FEV1FVC-%Pred-Pre: 76 %
FEV6-%Change-Post: 0 %
FEV6-%PRED-POST: 77 %
FEV6-%PRED-PRE: 77 %
FEV6-POST: 1.99 L
FEV6-Pre: 2.01 L
FEV6FVC-%CHANGE-POST: 1 %
FEV6FVC-%PRED-POST: 103 %
FEV6FVC-%Pred-Pre: 102 %
FVC-%CHANGE-POST: -1 %
FVC-%PRED-POST: 74 %
FVC-%PRED-PRE: 75 %
FVC-POST: 2 L
FVC-PRE: 2.04 L
POST FEV6/FVC RATIO: 100 %
PRE FEV1/FVC RATIO: 59 %
Post FEV1/FVC ratio: 64 %
Pre FEV6/FVC Ratio: 99 %
RV % pred: 116 %
RV: 2.19 L
TLC % PRED: 94 %
TLC: 4.22 L

## 2016-07-04 NOTE — Progress Notes (Signed)
PFT completed today 07/04/16.

## 2016-07-16 ENCOUNTER — Other Ambulatory Visit (HOSPITAL_COMMUNITY): Payer: Self-pay | Admitting: Cardiology

## 2016-07-16 ENCOUNTER — Ambulatory Visit (INDEPENDENT_AMBULATORY_CARE_PROVIDER_SITE_OTHER): Payer: 59 | Admitting: *Deleted

## 2016-07-16 DIAGNOSIS — R001 Bradycardia, unspecified: Secondary | ICD-10-CM | POA: Diagnosis not present

## 2016-07-16 MED ORDER — FUROSEMIDE 40 MG PO TABS: 40.0000 mg | ORAL_TABLET | Freq: Two times a day (BID) | ORAL | 3 refills | Status: DC

## 2016-07-16 NOTE — Progress Notes (Signed)
Remote pacemaker transmission.   

## 2016-07-18 LAB — CUP PACEART REMOTE DEVICE CHECK
Brady Statistic AP VP Percent: 37 %
Brady Statistic AP VS Percent: 1 %
Brady Statistic AS VS Percent: 1 %
Brady Statistic RA Percent Paced: 37 %
Implantable Lead Implant Date: 19940705
Implantable Lead Location: 753858
Implantable Lead Location: 753859
Implantable Lead Location: 753860
Implantable Lead Model: 4024
Implantable Lead Model: 4524
Lead Channel Impedance Value: 450 Ohm
Lead Channel Impedance Value: 610 Ohm
Lead Channel Pacing Threshold Amplitude: 0.75 V
Lead Channel Pacing Threshold Amplitude: 1.125 V
Lead Channel Pacing Threshold Pulse Width: 0.4 ms
Lead Channel Pacing Threshold Pulse Width: 0.4 ms
Lead Channel Pacing Threshold Pulse Width: 1 ms
Lead Channel Sensing Intrinsic Amplitude: 5.5 mV
Lead Channel Setting Pacing Amplitude: 2.125
Lead Channel Setting Pacing Amplitude: 2.5 V
Lead Channel Setting Pacing Pulse Width: 0.4 ms
Lead Channel Setting Sensing Sensitivity: 2 mV
MDC IDC LEAD IMPLANT DT: 19940705
MDC IDC LEAD IMPLANT DT: 20140501
MDC IDC MSMT BATTERY REMAINING LONGEVITY: 74 mo
MDC IDC MSMT BATTERY REMAINING PERCENTAGE: 91 %
MDC IDC MSMT BATTERY VOLTAGE: 2.95 V
MDC IDC MSMT LEADCHNL RA PACING THRESHOLD AMPLITUDE: 1 V
MDC IDC MSMT LEADCHNL RA SENSING INTR AMPL: 2 mV
MDC IDC MSMT LEADCHNL RV IMPEDANCE VALUE: 360 Ohm
MDC IDC PG IMPLANT DT: 20140501
MDC IDC PG SERIAL: 2936617
MDC IDC SESS DTM: 20180709060010
MDC IDC SET LEADCHNL LV PACING PULSEWIDTH: 1 ms
MDC IDC SET LEADCHNL RA PACING AMPLITUDE: 2 V
MDC IDC STAT BRADY AS VP PERCENT: 62 %

## 2016-07-19 ENCOUNTER — Encounter: Payer: Self-pay | Admitting: Internal Medicine

## 2016-07-20 ENCOUNTER — Telehealth: Payer: Self-pay | Admitting: Nurse Practitioner

## 2016-07-20 ENCOUNTER — Ambulatory Visit: Payer: 59 | Admitting: Family Medicine

## 2016-07-20 ENCOUNTER — Encounter: Payer: Self-pay | Admitting: Cardiology

## 2016-07-20 NOTE — Telephone Encounter (Signed)
Left message to call back to discuss pt's question.  The only thing I see that has been sent to the pharmacy recently is Furosemide 40 mg one po BID by Dr Haroldine Laws.

## 2016-07-20 NOTE — Telephone Encounter (Signed)
New Message     Pt sent my chart message inquiring if you put her on new medication, she is confused?  Please call

## 2016-07-23 NOTE — Telephone Encounter (Signed)
Called patient unable to reach left message to give us a call back.

## 2016-07-23 NOTE — Telephone Encounter (Signed)
LMTCB

## 2016-07-23 NOTE — Telephone Encounter (Signed)
Called and spoke to patient, answered questions and cleared up confusion. Patient verbalized understanding.

## 2016-07-27 ENCOUNTER — Telehealth: Payer: Self-pay | Admitting: Family Medicine

## 2016-07-27 ENCOUNTER — Encounter: Payer: Self-pay | Admitting: General Practice

## 2016-07-27 ENCOUNTER — Ambulatory Visit (INDEPENDENT_AMBULATORY_CARE_PROVIDER_SITE_OTHER): Payer: 59 | Admitting: Family Medicine

## 2016-07-27 ENCOUNTER — Encounter: Payer: Self-pay | Admitting: Family Medicine

## 2016-07-27 VITALS — BP 110/82 | HR 68 | Resp 16 | Ht 62.0 in | Wt 122.2 lb

## 2016-07-27 DIAGNOSIS — I10 Essential (primary) hypertension: Secondary | ICD-10-CM

## 2016-07-27 DIAGNOSIS — F419 Anxiety disorder, unspecified: Secondary | ICD-10-CM

## 2016-07-27 DIAGNOSIS — F329 Major depressive disorder, single episode, unspecified: Secondary | ICD-10-CM

## 2016-07-27 DIAGNOSIS — I5022 Chronic systolic (congestive) heart failure: Secondary | ICD-10-CM | POA: Diagnosis not present

## 2016-07-27 DIAGNOSIS — F32A Depression, unspecified: Secondary | ICD-10-CM

## 2016-07-27 LAB — CBC WITH DIFFERENTIAL/PLATELET
BASOS ABS: 0.1 10*3/uL (ref 0.0–0.1)
Basophils Relative: 0.9 % (ref 0.0–3.0)
EOS ABS: 0.3 10*3/uL (ref 0.0–0.7)
Eosinophils Relative: 3.4 % (ref 0.0–5.0)
HEMATOCRIT: 41.3 % (ref 36.0–46.0)
Hemoglobin: 13.9 g/dL (ref 12.0–15.0)
LYMPHS PCT: 26.6 % (ref 12.0–46.0)
Lymphs Abs: 2.3 10*3/uL (ref 0.7–4.0)
MCHC: 33.7 g/dL (ref 30.0–36.0)
MCV: 96.7 fl (ref 78.0–100.0)
Monocytes Absolute: 0.6 10*3/uL (ref 0.1–1.0)
Monocytes Relative: 6.9 % (ref 3.0–12.0)
NEUTROS ABS: 5.4 10*3/uL (ref 1.4–7.7)
Neutrophils Relative %: 62.2 % (ref 43.0–77.0)
PLATELETS: 191 10*3/uL (ref 150.0–400.0)
RBC: 4.27 Mil/uL (ref 3.87–5.11)
RDW: 13.8 % (ref 11.5–15.5)
WBC: 8.6 10*3/uL (ref 4.0–10.5)

## 2016-07-27 LAB — BASIC METABOLIC PANEL
BUN: 25 mg/dL — ABNORMAL HIGH (ref 6–23)
CALCIUM: 10 mg/dL (ref 8.4–10.5)
CO2: 28 meq/L (ref 19–32)
CREATININE: 1.19 mg/dL (ref 0.40–1.20)
Chloride: 102 mEq/L (ref 96–112)
GFR: 48.42 mL/min — ABNORMAL LOW (ref >60.00)
GLUCOSE: 102 mg/dL — AB (ref 70–99)
Potassium: 4 mEq/L (ref 3.5–5.1)
Sodium: 141 mEq/L (ref 135–145)

## 2016-07-27 LAB — TSH: TSH: 1.03 u[IU]/mL (ref 0.35–4.50)

## 2016-07-27 LAB — LIPID PANEL
CHOL/HDL RATIO: 3
Cholesterol: 209 mg/dL — ABNORMAL HIGH (ref 0–200)
HDL: 65.3 mg/dL (ref >39.00)
LDL CALC: 121 mg/dL — AB (ref 0–99)
NONHDL: 143.64
TRIGLYCERIDES: 115 mg/dL (ref 0.0–149.0)
VLDL: 23 mg/dL (ref 0.0–40.0)

## 2016-07-27 LAB — HEPATIC FUNCTION PANEL
ALT: 12 U/L (ref 0–35)
AST: 15 U/L (ref 0–37)
Albumin: 4.7 g/dL (ref 3.5–5.2)
Alkaline Phosphatase: 62 U/L (ref 39–117)
BILIRUBIN DIRECT: 0.1 mg/dL (ref 0.0–0.3)
TOTAL PROTEIN: 7.3 g/dL (ref 6.0–8.3)
Total Bilirubin: 0.6 mg/dL (ref 0.2–1.2)

## 2016-07-27 MED ORDER — FLUOXETINE HCL 20 MG PO TABS: 20.0000 mg | ORAL_TABLET | Freq: Every day | ORAL | 3 refills | Status: DC

## 2016-07-27 MED ORDER — FLUOXETINE HCL 20 MG PO CAPS: 20.0000 mg | ORAL_CAPSULE | Freq: Every day | ORAL | 3 refills | Status: DC

## 2016-07-27 MED FILL — FLUoxetine HCL 20 MG TABS: 20 | 30 days supply | Qty: 30 | Fill #0
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
## 2016-07-27 NOTE — Assessment & Plan Note (Signed)
Chronic problem.  Following w/ Dr Haroldine Laws.  Asymptomatic at this time.  On ARB, beta blocker, lasix, and spironolactone.  Will follow along and assist as able.
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
## 2016-07-27 NOTE — Assessment & Plan Note (Signed)
Very well controlled today.  Asymptomatic.  Check labs.  No anticipated med changes.  Will follow.
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
## 2016-07-27 NOTE — Progress Notes (Signed)
Pre visit review using our clinic review tool, if applicable. No additional management support is needed unless otherwise documented below in the visit note. 

## 2016-07-27 NOTE — Telephone Encounter (Signed)
Angela Shepherd with pharmacy asking if Rx Fluoxetine could be for capsules and not tablets due to costs and capsules being cheaper

## 2016-07-27 NOTE — Telephone Encounter (Signed)
New rx sent, ok for capsules.

## 2016-07-30 ENCOUNTER — Encounter: Payer: Self-pay | Admitting: Emergency Medicine

## 2016-07-30 ENCOUNTER — Ambulatory Visit (INDEPENDENT_AMBULATORY_CARE_PROVIDER_SITE_OTHER): Payer: 59 | Admitting: Emergency Medicine

## 2016-07-30 DIAGNOSIS — J309 Allergic rhinitis, unspecified: Secondary | ICD-10-CM | POA: Diagnosis not present

## 2016-07-30 DIAGNOSIS — J449 Chronic obstructive pulmonary disease, unspecified: Secondary | ICD-10-CM | POA: Diagnosis not present

## 2016-07-30 MED ORDER — GLYCOPYRROLATE-FORMOTEROL 9-4.8 MCG/ACT IN AERO: 2.0000 | INHALATION_SPRAY | Freq: Two times a day (BID) | RESPIRATORY_TRACT | 0 refills | Status: AC

## 2016-07-30 MED ORDER — ALBUTEROL SULFATE HFA 108 (90 BASE) MCG/ACT IN AERS: 1.0000 | INHALATION_SPRAY | Freq: Four times a day (QID) | RESPIRATORY_TRACT | 1 refills | Status: DC | PRN

## 2016-07-30 MED FILL — VENTOLIN HFA 90 MCG INHALER: 108 (90 BAS | 25 days supply | Qty: 18 | Fill #0

## 2016-07-30 NOTE — Assessment & Plan Note (Signed)
We will do a trial of Bevespi 2 puffs twice a day for a month to see if this works better for your Stop Symbicort temporarily while we do this trial.  Call us to let us know if you prefer the [place] so we can order it through your pharmacy.  Take albuterol nebulizer or albuterol inhaler 2 puffs up to every 4 hours if needed for shortness of breath.  Follow with Dr Lamonte Sakai in 6 months or sooner if you have any problems

## 2016-07-30 NOTE — Assessment & Plan Note (Signed)
Continue your fluticasone nasal spray and loratadine as you are taking them

## 2016-07-30 NOTE — Addendum Note (Signed)
Addended by: Maryanna Shape A on: 07/30/2016 04:20 PM   Modules accepted: Orders

## 2016-07-30 NOTE — Patient Instructions (Addendum)
We will do a trial of Bevespi 2 puffs twice a day for a month to see if this works better for your Stop Symbicort temporarily while we do this trial.  Call us to let us know if you prefer the [place] so we can order it through your pharmacy.  Take albuterol nebulizer or albuterol inhaler 2 puffs up to every 4 hours if needed for shortness of breath.  Continue your fluticasone nasal spray and loratadine as you are taking them  Follow with Dr Lamonte Sakai in 6 months or sooner if you have any problems

## 2016-07-30 NOTE — Progress Notes (Signed)
Subjective:    Patient ID: Angela Shepherd, female    DOB: 1950/04/17, 66 y.o.   MRN: [id number]  HPI 66 year old woman with a history of tobacco use but I have seen in the past for obstructive lung disease. 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 I last saw her in September 2012. I have reviewed her primary function testing from that time that shows moderate obstruction without bronchodilator response, hyperinflation, and decreased diffusion capacity that corrects for alveolar volume. She has decreased her wt about 30 lbs since last time. She is referred back for recurrent episodes of cough, dyspnea, wheeze starting in October '16. She has also had continue rhinorrhea. She was given symbicort about a year ago, used it prn, still does so periodically. She was treated a few times with antibiotics, nebulized BD by Dr Birdie Riddle. Has also been seen in the ED. She hasn't had pred for over a year when she had volume overload that may have been exacerbated by pred.  She continues to have nasal gtt, dry cough. She now has albuterol / atrovent nebs for home use > uses this for days straight when her dyspnea, wheeze are worse, happens about once a month.   ROV 07/07/15 -- follow-up visit for evaluation of COPD and moderate obstruction on pulmonary function testing. I last saw her in March when we decided to start her on scheduled bronchodilator therapy. She also had other factors that were likely contributing to cough, mucus, more difficult control of her airflow obstruction. We added Symbicort via a spacer, loratadine and fluticasone nasal spray, and we changed her ACE inhibitor to valsartan. Breathing is better, cough much less. Lots of improvement in mucous and cough. Not smoking but she is around husband who smokes.   ROV 07/30/16 -- This follow-up visit for COPD.  Also with a history of cough and mucus production. We changed her ACE inhibitor to an ARB with some benefit. He function testing was done on 07/04/16 and I have reviewed. This shows severe obstruction without a bronchodilator response. Lung volume are normal suggestive of possible coexisting restriction. Decreased diffusion capacity that corrects to the normal range when adjusted for alveolar volume. She is having no cough. She is benefiting from allergy regimen. She is active, exercises every day. She is on Symbicort. Very rare albuterol use.     Review of Systems  Constitutional: Negative.  Negative for fever and unexpected weight change.  HENT: Negative for congestion, dental problem, ear pain, nosebleeds, postnasal drip, rhinorrhea, sinus pressure, sneezing, sore throat and trouble swallowing.   Eyes: Negative.  Negative for redness and itching.  Respiratory: Negative.  Negative for cough, chest tightness, shortness of breath and wheezing.   Cardiovascular: Negative.  Negative for palpitations and leg swelling.  Gastrointestinal: Negative.  Negative for nausea and vomiting.  Endocrine: Negative.   Genitourinary: Negative.  Negative for dysuria.  Musculoskeletal: Negative.  Negative for joint swelling.  Skin: Negative.  Negative for rash.  Allergic/Immunologic: Negative.   Neurological: Negative for headaches.  Hematological: Negative.  Does not bruise/bleed easily.  Psychiatric/Behavioral: Negative.  Negative for dysphoric mood. The patient is not nervous/anxious.    Past Medical History:  Diagnosis Date  . Bradycardia    a. initial PPM placed 1994 at [facility] with gen change 2002. b. 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 Jude BiV PPM  05/08/12.  . Chronic systolic CHF (congestive heart failure) ([place])    a. EF 30-35% dx in 2011 -> most recent 40-45% by echo 04/2012. b. s/p upgrade to BiV-PPM 05/08/12. c. Med titration limited by hypotension.  . Cluster headache syndrome     . Hypertr obst cardiomyop   . Hypokalemia   . Hypotension   . Paroxysmal atrial fibrillation (HCC)    a. identified on PPM interrogation b. longest episode 1 hour; if burden increases, will need [place]   . Pneumonia 2008     Family History  Problem Relation Age of Onset  . Heart disease Sister   . Dementia Mother   . Cancer Sister 35       uterine  . Diabetes Unknown   . Cardiomyopathy Sister   . Diabetes Brother   . Colon cancer Neg Hx   . Esophageal cancer Neg Hx   . Stomach cancer Neg Hx   . Rectal cancer Neg Hx      Social History   Social History  . Marital status: Married    Spouse name: N/A  . Number of children: 2  . Years of education: N/A   Occupational History  . [employer]   Social History Main Topics  . Smoking status: Former Smoker    Packs/day: 3.00  . Smokeless tobacco: Never Used  . Alcohol use 1.2 oz/week    2 Glasses of wine per week  . Drug use: No  . Sexual activity: No   Other Topics Concern  . Not on file   Social History Narrative   ** Merged History Encounter **         Allergies  Allergen Reactions  . Prednisone Other (See Comments)    SOB, CHF      Outpatient Medications Prior to Visit  Medication Sig Dispense Refill  . ALPRAZolam (XANAX) 0.25 MG tablet Take 1 tablet (0.25 mg total) by mouth 2 (two) times daily as needed for anxiety. 30 tablet 3  . AMBULATORY NON FORMULARY MEDICATION Medication Name: 100% O2 15 leters a min for 15-20 mins  Via Nasal cannual 1 Bottle 2  . amitriptyline (ELAVIL) 10 MG tablet Take 10 mg by mouth at bedtime.    . carvedilol (COREG) 12.5 MG tablet TAKE 1 TABLET (12.5 MG TOTAL) BY MOUTH 2 (TWO) TIMES DAILY WITH A MEAL. 60 tablet 3  . cyclobenzaprine (FLEXERIL) 5 MG tablet TAKE 1 TABLET (5 MG TOTAL) BY MOUTH 3 (THREE) TIMES DAILY AS NEEDED FOR MUSCLE SPASMS. 30 tablet 2  . diclofenac sodium (VOLTAREN) 1 % GEL Apply 2 g topically 4 (four) times daily. 3 Tube 0  . FLUoxetine  (PROZAC) 20 MG capsule Take 1 capsule (20 mg total) by mouth daily. 30 capsule 3  . fluticasone (FLONASE) 50 MCG/ACT nasal spray PLACE 2 SPRAYS INTO BOTH NOSTRILS DAILY. 16 g 2  . furosemide (LASIX) 40 MG tablet Take 1 tablet (40 mg total) by mouth 2 (two) times daily. 60 tablet 3  . HYDROcodone-acetaminophen (NORCO/VICODIN) 5-325 MG tablet Take 1 tablet by mouth every 6 (six) hours as needed for moderate pain. 20 tablet 0  . ipratropium-albuterol (DUONEB) 0.5-2.5 (3) MG/3ML SOLN USE 1 VIAL (3ML) BY NEBULIZER EVERY 4 TO 6 HOURS AS NEEDED 90 mL 0  . loratadine (CLARITIN) 10 MG tablet Take 1 tablet (10 mg total) by mouth daily. 30 tablet 5  . nicotine (NICODERM CQ - DOSED IN MG/24 HOURS) 14 mg/24hr patch Place 1  patch (14 mg total) onto the skin daily. 28 patch 0  . PULMICORT FLEXHALER 180 MCG/ACT inhaler   1  . REA LO 40 40 % CREA Apply 1 application topically daily as needed. Dry skin  0  . Spacer/Aero-Holding Chambers (AEROCHAMBER MV) inhaler Use as instructed with Symbicort 1 each 0  . spironolactone (ALDACTONE) 25 MG tablet TAKE 1/2 TABLET BY MOUTH DAILY 15 tablet 6  . SUMAtriptan (IMITREX) 50 MG tablet TAKE 1 TABLET AT ONSET OF HEADACHE. MAY REPEAT IN 2 HOURS IF HEADACHE PERSISTS OR RECURS. 10 tablet 6  . SYMBICORT 160-4.5 MCG/ACT inhaler INHALE 2 PUFFS BY MOUTH INTO THE LUNGS 2 TIMES A DAY (Patient taking differently: INHALE 2 PUFFS BY MOUTH INTO THE LUNGS 2 TIMES A DAY as needed) 10.2 g 3  . topiramate (TOPAMAX) 50 MG tablet Take 50 mg by mouth daily.    . valsartan (DIOVAN) 40 MG tablet TAKE 1 TABLET BY MOUTH DAILY 30 tablet 1  . albuterol (PROVENTIL HFA;VENTOLIN HFA) 108 (90 BASE) MCG/ACT inhaler Inhale 1-2 puffs into the lungs every 6 (six) hours as needed for wheezing or shortness of breath. 1 Inhaler 0   No facility-administered medications prior to visit.          Objective:   Physical Exam Vitals:   07/30/16 1541  BP: 112/68  Pulse: 80  SpO2: 98%  Weight: 123 lb (55.8 kg)    Height: 5\' 2"  (1.575 m)   Gen: Pleasant, well-nourished, in no distress,  normal affect  ENT: No lesions,  mouth clear,  oropharynx clear, no nasal congestion  Neck: No JVD, no stridor  Lungs: No use of accessory muscles,  B insp and exp scattered wheeze.   Cardiovascular: RRR, heart sounds normal, no murmur or gallops, no peripheral edema  Musculoskeletal: No deformities, no cyanosis or clubbing  Neuro: alert, non focal  Skin: Warm, no lesions or rashes      Assessment & Plan:  COPD (chronic obstructive pulmonary disease) We will do a trial of Bevespi 2 puffs twice a day for a month to see if this works better for your Stop Symbicort temporarily while we do this trial.  Call us to let us know if you prefer the [place] so we can order it through your pharmacy.  Take albuterol nebulizer or albuterol inhaler 2 puffs up to every 4 hours if needed for shortness of breath.  Follow with Dr Lamonte Sakai in 6 months or sooner if you have any problems  Chronic allergic rhinitis Continue your fluticasone nasal spray and loratadine as you are taking them   Baltazar Apo, MD, PhD 07/30/2016, 4:13 PM [facility] [phone] or if no answer [phone]

## 2016-08-08 ENCOUNTER — Telehealth: Payer: Self-pay | Admitting: Emergency Medicine

## 2016-08-08 NOTE — Telephone Encounter (Signed)
LMOVM advising patient is due for Prolia injection. She can call back to schedule a nurse visit to receive. Patient does have a pending appt on 09/07/16 with Dr. Birdie Riddle   Per Keane Scrape, patient is due for her Prolia injection after 08/09/16. There is a Prolia injection here for patient to receive.

## 2016-08-22 DIAGNOSIS — R0902 Hypoxemia: Secondary | ICD-10-CM | POA: Diagnosis not present

## 2016-08-22 DIAGNOSIS — F17210 Nicotine dependence, cigarettes, uncomplicated: Secondary | ICD-10-CM | POA: Diagnosis not present

## 2016-08-22 DIAGNOSIS — I272 Pulmonary hypertension, unspecified: Secondary | ICD-10-CM | POA: Diagnosis not present

## 2016-08-22 DIAGNOSIS — F17200 Nicotine dependence, unspecified, uncomplicated: Secondary | ICD-10-CM | POA: Diagnosis not present

## 2016-08-22 DIAGNOSIS — I340 Nonrheumatic mitral (valve) insufficiency: Secondary | ICD-10-CM | POA: Diagnosis not present

## 2016-08-22 DIAGNOSIS — J449 Chronic obstructive pulmonary disease, unspecified: Secondary | ICD-10-CM | POA: Diagnosis not present

## 2016-08-22 DIAGNOSIS — Z7951 Long term (current) use of inhaled steroids: Secondary | ICD-10-CM | POA: Diagnosis not present

## 2016-08-22 DIAGNOSIS — E872 Acidosis: Secondary | ICD-10-CM | POA: Diagnosis not present

## 2016-08-22 DIAGNOSIS — I361 Nonrheumatic tricuspid (valve) insufficiency: Secondary | ICD-10-CM | POA: Diagnosis not present

## 2016-08-22 DIAGNOSIS — J9601 Acute respiratory failure with hypoxia: Secondary | ICD-10-CM | POA: Diagnosis not present

## 2016-08-22 DIAGNOSIS — R0602 Shortness of breath: Secondary | ICD-10-CM | POA: Diagnosis not present

## 2016-08-22 DIAGNOSIS — R0689 Other abnormalities of breathing: Secondary | ICD-10-CM | POA: Diagnosis not present

## 2016-08-22 DIAGNOSIS — I5023 Acute on chronic systolic (congestive) heart failure: Secondary | ICD-10-CM | POA: Diagnosis not present

## 2016-08-22 DIAGNOSIS — I517 Cardiomegaly: Secondary | ICD-10-CM | POA: Diagnosis not present

## 2016-08-22 DIAGNOSIS — J9602 Acute respiratory failure with hypercapnia: Secondary | ICD-10-CM | POA: Diagnosis not present

## 2016-08-22 DIAGNOSIS — I081 Rheumatic disorders of both mitral and tricuspid valves: Secondary | ICD-10-CM | POA: Diagnosis not present

## 2016-08-22 DIAGNOSIS — R062 Wheezing: Secondary | ICD-10-CM | POA: Diagnosis not present

## 2016-08-22 DIAGNOSIS — N183 Chronic kidney disease, stage 3 (moderate): Secondary | ICD-10-CM | POA: Diagnosis not present

## 2016-08-22 DIAGNOSIS — R51 Headache: Secondary | ICD-10-CM | POA: Diagnosis not present

## 2016-08-23 ENCOUNTER — Other Ambulatory Visit: Payer: Self-pay | Admitting: Emergency Medicine

## 2016-08-23 ENCOUNTER — Telehealth: Payer: Self-pay | Admitting: Family Medicine

## 2016-08-23 ENCOUNTER — Other Ambulatory Visit (HOSPITAL_COMMUNITY): Payer: Self-pay | Admitting: Adult Health

## 2016-08-23 MED FILL — SPIRONOLACTONE 25 MG TABLET: 25 | 30 days supply | Qty: 15 | Fill #0

## 2016-08-23 MED FILL — FLUTICASONE PROP 50 MCG SPR: 50 | 30 days supply | Qty: 16 | Fill #0

## 2016-08-23 MED FILL — VALSARTAN 40 MG TABLET: 40 | 30 days supply | Qty: 30 | Fill #0

## 2016-08-23 MED FILL — IPRAT-ALBUT 0.5-3(2.5) MG/3: 0.5-2.5 (3) | 23 days supply | Qty: 270 | Fill #0

## 2016-08-23 NOTE — Telephone Encounter (Signed)
Daughter calling asking if an order/Rx could be sent in for pt to get two nebulizer machine (1 for work and 1 for home) [facility] [address], daughter states that they will pay out of pocket for the second one if insurance will not cover two.

## 2016-08-23 NOTE — Telephone Encounter (Signed)
Pt saw Dr Lamonte Sakai on 7/23 and I recommend that this comes from pulmonary

## 2016-08-23 NOTE — Telephone Encounter (Signed)
Called pt daughter back and advised that per PCP this order should come from pulmonology. Pt daughter stated an understanding.

## 2016-08-23 NOTE — Telephone Encounter (Signed)
Please advise pt last seen on 07/2016

## 2016-08-24 ENCOUNTER — Telehealth: Payer: Self-pay | Admitting: *Deleted

## 2016-08-24 ENCOUNTER — Telehealth: Payer: Self-pay | Admitting: Emergency Medicine

## 2016-08-24 ENCOUNTER — Other Ambulatory Visit: Payer: Self-pay | Admitting: General Practice

## 2016-08-24 DIAGNOSIS — J449 Chronic obstructive pulmonary disease, unspecified: Secondary | ICD-10-CM

## 2016-08-24 MED ORDER — ALPRAZOLAM 0.25 MG PO TABS: 0.2500 mg | ORAL_TABLET | Freq: Two times a day (BID) | ORAL | 3 refills | Status: DC | PRN

## 2016-08-24 MED FILL — ALPRAZolam 0.25 MG TABS: 0.25 | 15 days supply | Qty: 30 | Fill #0

## 2016-08-24 NOTE — Telephone Encounter (Signed)
Medication filled to pharmacy as requested.   

## 2016-08-24 NOTE — Addendum Note (Signed)
Addended by: Davis Gourd on: 08/24/2016 03:15 PM   Modules accepted: Orders

## 2016-08-24 NOTE — Telephone Encounter (Signed)
DME orders placed and [facility] notified.

## 2016-08-24 NOTE — Telephone Encounter (Signed)
Called and lmomtcb for Anderson Malta to call us back.  Looks like Dr. Birdie Riddle fills her nebulizer meds, etc.

## 2016-08-24 NOTE — Telephone Encounter (Signed)
Please advise, could not reach pt daughter. Dr. Lamonte Sakai is out of office until 09/03/16.

## 2016-08-24 NOTE — Telephone Encounter (Signed)
Ok to send to [place] as noted below

## 2016-08-24 NOTE — Telephone Encounter (Signed)
Patient states that Pulmonology does not want to order her a nebulizer machine since [facility] ordered the first one.   Patient states that hers is broken and she really needs an order for one today.

## 2016-08-24 NOTE — Telephone Encounter (Signed)
Last OV 07/27/16 Alprazolam last filled 06/27/15 #30 with 3

## 2016-08-24 NOTE — Telephone Encounter (Signed)
Please advise 

## 2016-08-24 NOTE — Telephone Encounter (Signed)
Ok to order nebulizer machine and tubing through DME supplier but this makes no sense as Pulm is treating her lung issues

## 2016-08-24 NOTE — Telephone Encounter (Signed)
Called and spoke with pt's daughter, Anderson Malta. Anderson Malta is requesting Rx for nebulizer machine to be sent to [facility], due to current machine being broken.  Dr. Audie Pinto originally ordered machine.    Anderson Malta is requesting machine to be ordered today if possible. Anderson Malta states neb solution is not needed at this time.  RB please advise. Thanks.

## 2016-08-27 ENCOUNTER — Telehealth: Payer: Self-pay | Admitting: Family Medicine

## 2016-08-27 DIAGNOSIS — R05 Cough: Secondary | ICD-10-CM | POA: Diagnosis not present

## 2016-08-27 DIAGNOSIS — J449 Chronic obstructive pulmonary disease, unspecified: Secondary | ICD-10-CM | POA: Diagnosis not present

## 2016-08-27 NOTE — Telephone Encounter (Signed)
Anderson Malta returned call, CB is [phone].

## 2016-08-27 NOTE — Telephone Encounter (Signed)
Spoke with pt's daughter. She is aware that we will place the order for the nebulizer machine. Order has been placed. Nothing further was needed.

## 2016-08-27 NOTE — Telephone Encounter (Signed)
OK to order a new machine

## 2016-08-27 NOTE — Telephone Encounter (Signed)
Pt states that she is coming in on 8/31 for f/u and would like to get her prolia injection on same day.

## 2016-08-27 NOTE — Telephone Encounter (Signed)
lmtcb x1 for pt's daughter, Anderson Malta.

## 2016-08-27 NOTE — Telephone Encounter (Signed)
Noted  

## 2016-08-28 ENCOUNTER — Other Ambulatory Visit: Payer: Self-pay | Admitting: *Deleted

## 2016-08-28 NOTE — Patient Outreach (Signed)
[facility]) Care Management  08/28/2016  Angela Shepherd Oct 24, 1950 [id number]   Subjective: Telephone call to patient's home number, no answer, left HIPAA compliant voicemail message, and requested call back.  Objective:Per chart review, patient hospitalized 08/22/16 -08/23/16 for Shortness of breath, Lactic acidosis, Hypercapnia, and   Congestive heart failure, unspecified HF chronicity, unspecified heart failure type.  Hospitalized at [facility], [place]. Patient also has a history of asthma, COPD, and acute on chronic systolic congestive heart failure.   Assessment: Received UMR Transition of care referral on 08/27/16.  Transition of care follow up pending patient contact.    Plan: RNCM will call patient for 2nd telephone outreach attempt, transition of care follow up, within 10 business days if no return call.    Angela Shepherd H. Annia Friendly, BSN, [place] Management [facility] Telephonic CM Phone: [phone] Fax: [phone]

## 2016-08-30 ENCOUNTER — Ambulatory Visit: Payer: 59 | Admitting: Family Medicine

## 2016-08-30 ENCOUNTER — Other Ambulatory Visit: Payer: Self-pay | Admitting: *Deleted

## 2016-08-30 NOTE — Patient Outreach (Signed)
[facility] [facility]) Care Management  08/30/2016  REEVA DAVERN 1950-06-23 [id number]   Subjective: Received voicemail message from Ms. Goins, states she is returning call, and requested call back at work number between 7:00am - 4:00pm.  Telephone call to patient's work number, per her request,  no answer, left HIPAA compliant voicemail message, and requested call back.   Objective:Per chart review, patient hospitalized 08/22/16 -08/23/16 for Shortness of breath, Lactic acidosis, Hypercapnia, and   Congestive heart failure, unspecified HF chronicity, unspecified heart failure type.  Hospitalized at [facility], [place]. Patient also has a history of asthma, COPD, and acute on chronic systolic congestive heart failure.   Assessment: Received UMR Transition of care referral on 08/27/16.  Transition of care follow up pending patient contact.    Plan: RNCM will call patient for 3rd telephone outreach attempt, transition of care follow up, within 10 business days if no return call.    Audi Wettstein H. Annia Friendly, BSN, [place] Management [facility] Telephonic CM Phone: [phone] Fax: [phone]

## 2016-08-31 ENCOUNTER — Encounter: Payer: Self-pay | Admitting: *Deleted

## 2016-08-31 ENCOUNTER — Other Ambulatory Visit: Payer: Self-pay | Admitting: *Deleted

## 2016-08-31 NOTE — Patient Outreach (Signed)
[facility]) Care Management  08/31/2016  Angela Shepherd 04-Jun-1950 [id number]   Subjective: Telephone call to patient's home number, no answer, left HIPAA compliant voicemail message, and requested call back.   Objective:Per chart review, patient hospitalized 08/22/16 -08/23/16 for Shortness of breath, Lactic acidosis, Hypercapnia, and  Congestive heart failure, unspecified HF chronicity, unspecified heart failure type. Hospitalized at [facility], [place]. Patient also has a history of asthma, COPD, and acute on chronic systolic congestive heart failure.   Assessment: Received UMR Transition of care referral on 08/27/16. Transition of care follow up pending patient contact.    Plan: RNCM will send unsuccessful outreach  letter, [facility] pamphlet, and proceed with case closure, within 10 business days if no return call.    Bohdi Leeds H. Annia Friendly, BSN, [place] Management [facility] Telephonic CM Phone: [phone] Fax: [phone]

## 2016-08-31 NOTE — Patient Outreach (Addendum)
[place] [facility]) Care Management  08/31/2016  Angela Shepherd 1950-04-05 [id number]   Subjective: Received voicemail message from Ms. Goins, states she is returning call, and requested call back at work number.  Telephone call to patient's home number, spoke with patient, and HIPAA verified.  Discussed [facility] Care Management UMR Transition of care follow up, patient voiced understanding, and is in agreement to follow up.   Patient states she is doing much better, has returned to work, no shortness of breath, or activity restrictions. States she has a follow up appointment with primary MD on 09/07/16. Patient voices understanding of medical diagnosis and treatment plan.   States she is able to self manage.  States she ate something she should not have eaten while family was in town visiting and went into fluid overload.   States she now more in tuned to her congestive heart failure triggers, will be more proactive in the future,  and will follow up with provider to determine her triggers for COPD exacerbation versus congestive heart failure. States she is accessing the following [facility] benefits: outpatient pharmacy, hospital indemnity, and has family medical leave act (FMLA) in place.  States she is very interested in the [employer] program for COPD and Congestive Heart Failure, and is aware that it has not went into effect.  States she will continue to seek updates on the program status. Patient states she does not have any education material, transition of care, care coordination, disease management, disease monitoring, transportation, community resource, or pharmacy needs at this time. States she is very appreciative of the follow up and is in agreement to receive [place] [facility] information.    Objective:Per chart review, patient hospitalized 08/22/16 -08/23/16 for Shortness of breath, Lactic acidosis, Hypercapnia, and  Congestive heart failure, unspecified HF chronicity, unspecified  heart failure type. Hospitalized at [facility], [place]. Patient also has a history of asthma, COPD, and acute on chronic systolic congestive heart failure.   Assessment: Received UMR Transition of care referral on 08/27/16. Transition of care follow up completed, no care management needs, and will proceed with case closure.    Plan: RNCM will send patient successful outreach letter, [facility] pamphlet, and magnet. RNCM will send case closure due to follow up completed / no care management needs request to [place] [facility] [place] [facility].     Json Koelzer H. Annia Friendly, BSN, [place] Management [facility] Telephonic CM Phone: [phone] Fax: [phone]

## 2016-09-06 MED FILL — FUROSEMIDE 80 MG TABLET: 80 | 30 days supply | Qty: 30 | Fill #1

## 2016-09-06 MED FILL — CARVEDILOL 12.5 MG TABLET: 12.5 | 30 days supply | Qty: 60 | Fill #1

## 2016-09-07 ENCOUNTER — Ambulatory Visit (INDEPENDENT_AMBULATORY_CARE_PROVIDER_SITE_OTHER): Payer: 59 | Admitting: Family Medicine

## 2016-09-07 ENCOUNTER — Ambulatory Visit: Payer: 59 | Admitting: Nurse Practitioner

## 2016-09-07 ENCOUNTER — Encounter: Payer: Self-pay | Admitting: Family Medicine

## 2016-09-07 VITALS — BP 116/72 | HR 65 | Temp 98.1°F | Resp 16 | Ht 62.0 in | Wt 122.2 lb

## 2016-09-07 DIAGNOSIS — Z23 Encounter for immunization: Secondary | ICD-10-CM | POA: Diagnosis not present

## 2016-09-07 DIAGNOSIS — M810 Age-related osteoporosis without current pathological fracture: Secondary | ICD-10-CM | POA: Diagnosis not present

## 2016-09-07 DIAGNOSIS — F32A Depression, unspecified: Secondary | ICD-10-CM

## 2016-09-07 DIAGNOSIS — F419 Anxiety disorder, unspecified: Secondary | ICD-10-CM

## 2016-09-07 DIAGNOSIS — Z7183 Encounter for nonprocreative genetic counseling: Secondary | ICD-10-CM | POA: Diagnosis not present

## 2016-09-07 DIAGNOSIS — I5023 Acute on chronic systolic (congestive) heart failure: Secondary | ICD-10-CM | POA: Diagnosis not present

## 2016-09-07 DIAGNOSIS — Z1371 Encounter for nonprocreative screening for genetic disease carrier status: Secondary | ICD-10-CM | POA: Diagnosis not present

## 2016-09-07 DIAGNOSIS — F329 Major depressive disorder, single episode, unspecified: Secondary | ICD-10-CM | POA: Diagnosis not present

## 2016-09-07 DIAGNOSIS — Z8249 Family history of ischemic heart disease and other diseases of the circulatory system: Secondary | ICD-10-CM | POA: Diagnosis not present

## 2016-09-07 DIAGNOSIS — I422 Other hypertrophic cardiomyopathy: Secondary | ICD-10-CM | POA: Diagnosis not present

## 2016-09-07 MED ORDER — DENOSUMAB 60 MG/ML ~~LOC~~ SOLN
60.0000 mg | Freq: Once | SUBCUTANEOUS | Status: AC
  Administered 2016-09-07: 60 mg via SUBCUTANEOUS

## 2016-09-07 NOTE — Assessment & Plan Note (Signed)
Much improved since stopping Lexapro and switching to Prozac.  No med changes at this time.  Will continue to follow.

## 2016-09-07 NOTE — Assessment & Plan Note (Addendum)
Pt was admitted w/ acute CHF exacerbation at [facility] regional due to dietary indiscretions.  Was able to quickly diurese and wean O2.  Pt has been much more vigilant about her Na intake since d/c.  Plans to schedule f/u w/ Dr Haroldine Laws.  Will continue to follow along.

## 2016-09-07 NOTE — Progress Notes (Signed)
   Subjective:    Patient ID: Angela Shepherd, female    DOB: December 10, 1950, 66 y.o.   MRN: [id number]  HPI Hospital F/U- pt was admitted 8/15-16 for CHF exacerbation w/ acute respiratory failure.  Initially required Bipap but was able to wean off all O2 w/ diuresis.  Was switched from IV lasix to oral lasix.  Pt reports her sxs were due to lack of dietary restrictions.  She had been eating out w/ her granddaughter.  Pt reports SOB has resolved.  No edema.  ECHO showed EF of 40-45%.  Pt plans to follow up w/ Dr Haroldine Laws.  Anxiety and depression- much improved since switching to Prozac.  'it's like night and day'.    Osteoporosis- due for prolia   Review of Systems For ROS see HPI     Objective:   Physical Exam  Constitutional: She is oriented to person, place, and time. She appears well-developed and well-nourished. No distress.  HENT:  Head: Normocephalic and atraumatic.  Eyes: Pupils are equal, round, and reactive to light. Conjunctivae and EOM are normal.  Neck: Normal range of motion. Neck supple. No thyromegaly present.  Cardiovascular: Normal rate, regular rhythm, normal heart sounds and intact distal pulses.   No murmur heard. Pulmonary/Chest: Effort normal and breath sounds normal. No respiratory distress.  Abdominal: Soft. She exhibits no distension. There is no tenderness.  Musculoskeletal: She exhibits no edema.  Lymphadenopathy:    She has no cervical adenopathy.  Neurological: She is alert and oriented to person, place, and time.  Skin: Skin is warm and dry.  Psychiatric: She has a normal mood and affect. Her behavior is normal.  Vitals reviewed.         Assessment & Plan:

## 2016-09-07 NOTE — Addendum Note (Signed)
Addended by: Davis Gourd on: 09/07/2016 11:23 AM   Modules accepted: Orders

## 2016-09-07 NOTE — Patient Instructions (Addendum)
Schedule your complete physical in 6 months No med changes at this time- you look great! Call Dr Haroldine Laws and schedule an appt Call with any questions or concerns Happy Labor Day!!!

## 2016-09-07 NOTE — Assessment & Plan Note (Signed)
Chronic problem.  Pt to receive Prolia injxn today.

## 2016-09-07 NOTE — Progress Notes (Signed)
Pre visit review using our clinic review tool, if applicable. No additional management support is needed unless otherwise documented below in the visit note. 

## 2016-09-10 NOTE — Progress Notes (Signed)
Cardiology Office Note Date:  09/12/2016  Patient ID:  Angela, Shepherd [birth year], MRN [id number] PCP:  Midge Minium, MD  Cardiologist:  Dr. Phillip Heal Electrophysiologist: Dr. Rayann Heman    Chief Complaint: device visit, post hospital visit History of Present Illness: Angela Shepherd is a 66 y.o. 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  She comes today to be seen for Dr. Rayann Heman.  She was in [facility] hospital a couple weeks ago with CHF exacerbation.  She initially required BIPAP support and was diuresed weaned off O2 and discharged 08/23/16.  She is frustrated because she works very hard at keeping strict low salt diet and takes all of he rmedicines as prescribed.  She reports that they suggested of she had been on the same meds/doses for years, to discuss with her docs any possible changes.  She denied any kind of CP, no near syncope or syncope.  She does not tend to get pedal edema but bloated initially.  She did go to a restaurant the evening prior had salad and vegetables only, she did not think either had much in the way of salt.  She was also told about a Cardiomems device and wonders if she is a candidate.   Device History: STJ dual chamber PPM implanted 1994 for Mobitz II; gen change 2002; STJ CRT-P implanted 05/2012 for 1st degree AV block and intermittent Mobitz II with non-ischemic cardiomyopathy.   Past Medical History:  Diagnosis Date  . Bradycardia    a. initial PPM placed 1994 at [facility] with gen change 2002. b. 1*AVB & intermittent 2nd degree AVB + CHF --> upgrade to [facility]. Jude BiV PPM 05/08/12.  . Chronic systolic CHF (congestive heart failure) ([place])    a. EF 30-35% dx in 2011 -> most recent 40-45% by echo 04/2012. b. s/p upgrade to BiV-PPM 05/08/12. c. Med titration limited by hypotension.  . Cluster headache syndrome   .  Hypertr obst cardiomyop   . Hypokalemia   . Hypotension   . Paroxysmal atrial fibrillation (HCC)    a. identified on PPM interrogation b. longest episode 1 hour; if burden increases, will need [place]   . Pneumonia 2008    Past Surgical History:  Procedure Laterality Date  . BI-VENTRICULAR PACEMAKER UPGRADE N/A 05/08/2012   upgrade to SJM Anthem (CRT-P) by Dr Rayann Heman  . PACEMAKER INSERTION  1994; 2002; 05/08/2012  . TONSILLECTOMY  ~ 1959  . TUBAL LIGATION  1984?    Current Outpatient Prescriptions  Medication Sig Dispense Refill  . albuterol (PROVENTIL HFA;VENTOLIN HFA) 108 (90 Base) MCG/ACT inhaler Inhale 1-2 puffs into the lungs every 6 (six) hours as needed for wheezing or shortness of breath. 1 Inhaler 1  . ALPRAZolam (XANAX) 0.25 MG tablet Take 1 tablet (0.25 mg total) by mouth 2 (two) times daily as needed for anxiety. 30 tablet 3  . AMBULATORY NON FORMULARY MEDICATION Medication Name: 100% O2 15 leters a min for 15-20 mins  Via Nasal cannual 1 Bottle 2  . carvedilol (COREG) 12.5 MG tablet TAKE 1 TABLET (12.5 MG TOTAL) BY MOUTH 2 (TWO) TIMES DAILY WITH A MEAL. 60 tablet 3  . cyclobenzaprine (FLEXERIL) 5 MG tablet TAKE 1 TABLET (5 MG TOTAL) BY MOUTH 3 (THREE) TIMES DAILY AS NEEDED FOR MUSCLE SPASMS. 30 tablet 2  . diclofenac sodium (VOLTAREN) 1 %  GEL Apply 2 g topically 4 (four) times daily. 3 Tube 0  . FLUoxetine (PROZAC) 20 MG capsule Take 1 capsule (20 mg total) by mouth daily. 30 capsule 3  . fluticasone (FLONASE) 50 MCG/ACT nasal spray PLACE 2 SPRAYS INTO BOTH NOSTRILS DAILY. 16 g 5  . ipratropium-albuterol (DUONEB) 0.5-2.5 (3) MG/3ML SOLN USE 1 VIAL (3ML) BY NEBULIZER EVERY 4 TO 6 HOURS AS NEEDED 90 mL 0  . loratadine (CLARITIN) 10 MG tablet Take 1 tablet (10 mg total) by mouth daily. 30 tablet 5  . nicotine (NICODERM CQ - DOSED IN MG/24 HOURS) 14 mg/24hr patch Place 1 patch (14 mg total) onto the skin daily. 28 patch 0  . PULMICORT FLEXHALER 180 MCG/ACT inhaler   1  .  Spacer/Aero-Holding Chambers (AEROCHAMBER MV) inhaler Use as instructed with Symbicort 1 each 0  . spironolactone (ALDACTONE) 25 MG tablet TAKE 1/2 TABLET BY MOUTH DAILY 15 tablet 6  . SUMAtriptan (IMITREX) 50 MG tablet TAKE 1 TABLET AT ONSET OF HEADACHE. MAY REPEAT IN 2 HOURS IF HEADACHE PERSISTS OR RECURS. 10 tablet 6  . SYMBICORT 160-4.5 MCG/ACT inhaler INHALE 2 PUFFS BY MOUTH INTO THE LUNGS 2 TIMES A DAY (Patient taking differently: INHALE 2 PUFFS BY MOUTH INTO THE LUNGS 2 TIMES A DAY as needed) 10.2 g 3  . topiramate (TOPAMAX) 50 MG tablet Take 50 mg by mouth daily.    . valsartan (DIOVAN) 40 MG tablet TAKE 1 TABLET BY MOUTH DAILY 30 tablet 5  . furosemide (LASIX) 80 MG tablet Take 40 mg by mouth 2 (two) times daily after a meal.  3   No current facility-administered medications for this visit.     Allergies:   Prednisone   Social History:  The patient  reports that she has quit smoking. She smoked 3.00 packs per day. She has never used smokeless tobacco. She reports that she drinks about 1.2 oz of alcohol per week . She reports that she does not use drugs.   Family History:  The patient's family history includes Cancer (age of onset: 54) in her sister; Cardiomyopathy in her sister; Dementia in her mother; Diabetes in her brother and unknown relative; Heart disease in her sister.  ROS:  Please see the history of present illness.    All other systems are reviewed and otherwise negative.   PHYSICAL EXAM:  VS:  BP 106/68   Pulse 64   Ht 5\' 2"  (1.575 m)   Wt 123 lb (55.8 kg)   LMP  (LMP Unknown)   BMI 22.50 kg/m  BMI: Body mass index is 22.5 kg/m. Well nourished, well developed, in no acute distress  HEENT: normocephalic, atraumatic  Neck: no JVD, carotid bruits or masses Cardiac:  RRR; no significant murmurs, no rubs, or gallops Lungs: CTA b/l , no wheezing, rhonchi or rales  Abd: soft, nontender, non-distended MS: no deformity or atrophy Ext: no edema  Skin: warm and dry, no  rash Neuro:  No gross deficits appreciated Psych: euthymic mood, full affect  PPM site is stable, no tethering or discomfort   EKG:   SR, V paced PPM interrogation done today by idnustry and reviewed by myself: battery and lead measurements are good, >99% BiVe paced, 15 AMS episodes since June 30,2017 they are AFlutter, longest duration 5hours73minutes in December, she did have an episode 08/23/16 with her HR exacerbation of 3 hours 54minutes  Cath 12/2009: Normal coronaries. EF 40-45%.  Central aortic pressure was 86/47 with a mean of 62.  LV pressure is 74/13 with an EDP of 2.  Right atrial pressure mean of 3.  RV pressure 26/3 with an EDP of 5.  PA pressure 28/5 with a mean of 16.  Pulmonary capillary wedge pressure mean of 7.  Fick cardiac output was 3.4 and cardiac index 2.1.   Echo in March 2012:  EF 50-55%. Grade 2 diastolic dysfunction. IVS 1.4cm.  Echo 4/14: EF 40-45%  ECHO 09/03/13: EF 40-45% Echo 4/16: EF 45-50%  08/22/16: TTE LVEF 40-45% Trace AI, mild MR, mild p.HTN, probable mild LVH, abnormal LV motion c/w RV pacemaker  Recent Labs: 07/27/2016: ALT 12; BUN 25; Creatinine, Ser 1.19; Hemoglobin 13.9; Platelets 191.0; Potassium 4.0; Sodium 141; TSH 1.03  07/27/2016: Cholesterol 209; HDL 65.30; LDL Cholesterol 121; Total CHOL/HDL Ratio 3; Triglycerides 115.0; VLDL 23.0   CrCl cannot be calculated (Patient's most recent lab result is older than the maximum 21 days allowed.).   Wt Readings from Last 3 Encounters:  09/12/16 123 lb (55.8 kg)  09/07/16 122 lb 4 oz (55.5 kg)  07/30/16 123 lb (55.8 kg)     Other studies reviewed: Additional studies/records reviewed today include: summarized above  ASSESSMENT AND PLAN:  1. CRT-P     functioning well   2. Chronic CHF (systolic/diastolic)     Hx of HCM     no [place] available      She reports that her valsartan not felt to be involved in the recall, she will confirm with her pharmacy, but was told she would have been  notified if so.  She may benefit from trying Entresto.  She will be seeing Dr. Haroldine Laws soon and I will defer to him and any discussions regarding Cardiomems.  3. HTN     No changes today  4. SCAF noted by device     CHA2DS2Vasc is at least 3     Infrequent and of short duration     Burden I <1% Discussed with her AF, stroke risk, and burden, for now, we will continue to monitor closely.  Disposition: F/u with Dr. Haroldine Laws  Current medicines are reviewed at length with the patient today.  The patient did not have any concerns regarding medicines.  Venetia Night, PA-C 09/12/2016 2:27 PM     [address] [place] [address] [phone] (office)  [phone] (fax)

## 2016-09-12 ENCOUNTER — Ambulatory Visit (INDEPENDENT_AMBULATORY_CARE_PROVIDER_SITE_OTHER): Payer: 59 | Admitting: Physician Assistant

## 2016-09-12 VITALS — BP 106/68 | HR 64 | Ht 62.0 in | Wt 123.0 lb

## 2016-09-12 DIAGNOSIS — I10 Essential (primary) hypertension: Secondary | ICD-10-CM | POA: Diagnosis not present

## 2016-09-12 DIAGNOSIS — R001 Bradycardia, unspecified: Secondary | ICD-10-CM

## 2016-09-12 DIAGNOSIS — Z950 Presence of cardiac pacemaker: Secondary | ICD-10-CM | POA: Diagnosis not present

## 2016-09-12 DIAGNOSIS — I480 Paroxysmal atrial fibrillation: Secondary | ICD-10-CM

## 2016-09-12 DIAGNOSIS — I5032 Chronic diastolic (congestive) heart failure: Secondary | ICD-10-CM

## 2016-09-12 NOTE — Patient Instructions (Addendum)
Medication Instructions:   Your physician recommends that you continue on your current medications as directed. Please refer to the Current Medication list given to you today.   If you need a refill on your cardiac medications before your next appointment, please call your pharmacy.  Labwork: NONE ORDERED  TODAY    Testing/Procedures: NONE ORDERED  TODAY    Follow-Up: Your physician wants you to follow-up in:  IN  6  MONTHS WITH DR Rayann Heman  You will receive a reminder letter in the mail two months in advance. If you don't receive a letter, please call our office to schedule the follow-up appointment.  Remote monitoring is used to monitor your Pacemaker of ICD from home. This monitoring reduces the number of office visits required to check your device to one time per year. It allows [place] to keep an eye on the functioning of your device to ensure it is working properly. You are scheduled for a device check from home on . 10-15-16 You may send your transmission at any time that day. If you have a wireless device, the transmission will be sent automatically. After your physician reviews your transmission, you will receive a postcard with your next transmission date.    Any Other Special Instructions Will Be Listed Below (If Applicable).

## 2016-09-18 MED FILL — SPIRONOLACTONE 25 MG TABLET: 25 | 30 days supply | Qty: 15 | Fill #1

## 2016-09-19 ENCOUNTER — Telehealth (HOSPITAL_COMMUNITY): Payer: Self-pay | Admitting: *Deleted

## 2016-09-19 ENCOUNTER — Encounter: Payer: Self-pay | Admitting: Family Medicine

## 2016-09-19 DIAGNOSIS — R069 Unspecified abnormalities of breathing: Secondary | ICD-10-CM | POA: Diagnosis not present

## 2016-09-19 MED ORDER — POTASSIUM CHLORIDE CRYS ER 20 MEQ PO TBCR: 20.0000 meq | EXTENDED_RELEASE_TABLET | Freq: Every day | ORAL | 3 refills | Status: DC

## 2016-09-19 MED FILL — POTASSIUM CL ER 20 MEQ TABL: 20 | 90 days supply | Qty: 90 | Fill #0

## 2016-09-19 MED FILL — VALSARTAN 40 MG TABLET: 40 | 30 days supply | Qty: 30 | Fill #1

## 2016-09-19 NOTE — Telephone Encounter (Signed)
Advanced Heart Failure Triage Encounter  Patient Name: Angela Shepherd   Date of Call: 09/19/2016  Problem:  Pt called c/o increased shortness of breath, fatigue, and 3lb weight gain in 4 days. Pt currently taking lasix 40bid. Next office visit 9/20  Plan:  Per Amy increase Lasix to 80bid for 2 days with 80meq of K. Start K 2meq daily after 2 days. Pt aware and agreeable.   Harvie Junior, CMA

## 2016-09-20 ENCOUNTER — Ambulatory Visit: Payer: 59 | Admitting: Family Medicine

## 2016-09-20 DIAGNOSIS — R0602 Shortness of breath: Secondary | ICD-10-CM | POA: Diagnosis not present

## 2016-09-20 DIAGNOSIS — G8929 Other chronic pain: Secondary | ICD-10-CM | POA: Diagnosis not present

## 2016-09-20 DIAGNOSIS — R0789 Other chest pain: Secondary | ICD-10-CM | POA: Diagnosis not present

## 2016-09-20 DIAGNOSIS — Z720 Tobacco use: Secondary | ICD-10-CM | POA: Diagnosis not present

## 2016-09-20 DIAGNOSIS — F17210 Nicotine dependence, cigarettes, uncomplicated: Secondary | ICD-10-CM | POA: Diagnosis not present

## 2016-09-20 DIAGNOSIS — Z950 Presence of cardiac pacemaker: Secondary | ICD-10-CM | POA: Diagnosis not present

## 2016-09-20 DIAGNOSIS — I5023 Acute on chronic systolic (congestive) heart failure: Secondary | ICD-10-CM | POA: Diagnosis not present

## 2016-09-20 DIAGNOSIS — I5043 Acute on chronic combined systolic (congestive) and diastolic (congestive) heart failure: Secondary | ICD-10-CM | POA: Diagnosis not present

## 2016-09-20 DIAGNOSIS — R079 Chest pain, unspecified: Secondary | ICD-10-CM | POA: Diagnosis not present

## 2016-09-20 DIAGNOSIS — R9431 Abnormal electrocardiogram [ECG] [EKG]: Secondary | ICD-10-CM | POA: Diagnosis not present

## 2016-09-20 DIAGNOSIS — Z8249 Family history of ischemic heart disease and other diseases of the circulatory system: Secondary | ICD-10-CM | POA: Diagnosis not present

## 2016-09-20 DIAGNOSIS — J449 Chronic obstructive pulmonary disease, unspecified: Secondary | ICD-10-CM | POA: Diagnosis not present

## 2016-09-21 DIAGNOSIS — Z8249 Family history of ischemic heart disease and other diseases of the circulatory system: Secondary | ICD-10-CM | POA: Diagnosis not present

## 2016-09-21 DIAGNOSIS — G8929 Other chronic pain: Secondary | ICD-10-CM | POA: Diagnosis not present

## 2016-09-21 DIAGNOSIS — F17210 Nicotine dependence, cigarettes, uncomplicated: Secondary | ICD-10-CM | POA: Diagnosis not present

## 2016-09-21 DIAGNOSIS — I5023 Acute on chronic systolic (congestive) heart failure: Secondary | ICD-10-CM | POA: Diagnosis not present

## 2016-09-21 DIAGNOSIS — R0789 Other chest pain: Secondary | ICD-10-CM | POA: Diagnosis not present

## 2016-09-21 DIAGNOSIS — Z950 Presence of cardiac pacemaker: Secondary | ICD-10-CM | POA: Diagnosis not present

## 2016-09-21 DIAGNOSIS — J449 Chronic obstructive pulmonary disease, unspecified: Secondary | ICD-10-CM | POA: Diagnosis not present

## 2016-09-25 ENCOUNTER — Telehealth (HOSPITAL_COMMUNITY): Payer: Self-pay | Admitting: *Deleted

## 2016-09-25 ENCOUNTER — Other Ambulatory Visit (HOSPITAL_COMMUNITY): Payer: Self-pay | Admitting: Internal Medicine

## 2016-09-25 MED ORDER — FUROSEMIDE 80 MG PO TABS: 40.0000 mg | ORAL_TABLET | Freq: Two times a day (BID) | ORAL | 3 refills | Status: DC

## 2016-09-25 MED FILL — FLUoxetine HCL 20 MG CAPS: 20 | 30 days supply | Qty: 30 | Fill #0

## 2016-09-25 MED FILL — SYMBICORT 160-4.5 MCG INH: 160-4.5 | 30 days supply | Qty: 10 | Fill #2

## 2016-09-25 MED FILL — ALPRAZolam 0.25 MG TABS: 0.25 | 15 days supply | Qty: 30 | Fill #1

## 2016-09-25 NOTE — Telephone Encounter (Signed)
Advanced Heart Failure Triage Encounter  Patient Name: Angela Shepherd  Date of Call: 09/25/16  Problem:  Pt took extra lasix last week as prescribed and is now almost out of Lasix and unable to refill as it is too soon  Plan:  New RX sent to pharmacy to allow her to have some extra pills.  Left pt detailed VM about this and to call back for further questions/needs  Kevan Rosebush, RN

## 2016-09-26 MED FILL — FUROSEMIDE 80 MG TABLET: 80 | 22 days supply | Qty: 44 | Fill #0

## 2016-09-27 ENCOUNTER — Ambulatory Visit (HOSPITAL_COMMUNITY)
Admission: RE | Admit: 2016-09-27 | Discharge: 2016-09-27 | Disposition: A | Discharge status: Home / Self Care | Payer: 59 | Source: Ambulatory Visit | Attending: Internal Medicine | Admitting: Internal Medicine

## 2016-09-27 ENCOUNTER — Ambulatory Visit: Payer: 59 | Admitting: Family Medicine

## 2016-09-27 ENCOUNTER — Encounter (HOSPITAL_COMMUNITY): Payer: Self-pay

## 2016-09-27 VITALS — BP 128/74 | HR 71 | Wt 121.5 lb

## 2016-09-27 DIAGNOSIS — Z79899 Other long term (current) drug therapy: Secondary | ICD-10-CM | POA: Insufficient documentation

## 2016-09-27 DIAGNOSIS — E876 Hypokalemia: Secondary | ICD-10-CM | POA: Insufficient documentation

## 2016-09-27 DIAGNOSIS — Z7982 Long term (current) use of aspirin: Secondary | ICD-10-CM | POA: Diagnosis not present

## 2016-09-27 DIAGNOSIS — I5022 Chronic systolic (congestive) heart failure: Secondary | ICD-10-CM | POA: Diagnosis not present

## 2016-09-27 DIAGNOSIS — I480 Paroxysmal atrial fibrillation: Secondary | ICD-10-CM | POA: Insufficient documentation

## 2016-09-27 DIAGNOSIS — I110 Hypertensive heart disease with heart failure: Secondary | ICD-10-CM | POA: Insufficient documentation

## 2016-09-27 DIAGNOSIS — I10 Essential (primary) hypertension: Secondary | ICD-10-CM

## 2016-09-27 DIAGNOSIS — I420 Dilated cardiomyopathy: Secondary | ICD-10-CM

## 2016-09-27 DIAGNOSIS — Z87891 Personal history of nicotine dependence: Secondary | ICD-10-CM | POA: Insufficient documentation

## 2016-09-27 DIAGNOSIS — I421 Obstructive hypertrophic cardiomyopathy: Secondary | ICD-10-CM | POA: Diagnosis not present

## 2016-09-27 DIAGNOSIS — I5023 Acute on chronic systolic (congestive) heart failure: Secondary | ICD-10-CM

## 2016-09-27 LAB — BASIC METABOLIC PANEL
ANION GAP: 5 (ref 5–15)
BUN: 28 mg/dL — ABNORMAL HIGH (ref 6–20)
CALCIUM: 8.8 mg/dL — AB (ref 8.9–10.3)
CO2: 26 mmol/L (ref 22–32)
Chloride: 105 mmol/L (ref 101–111)
Creatinine, Ser: 1.15 mg/dL — ABNORMAL HIGH (ref 0.44–1.00)
GFR calc non Af Amer: 49 mL/min — ABNORMAL LOW (ref >60)
GFR, EST AFRICAN AMERICAN: 57 mL/min — AB (ref >60)
Glucose, Bld: 98 mg/dL (ref 65–99)
Potassium: 3.7 mmol/L (ref 3.5–5.1)
SODIUM: 136 mmol/L (ref 135–145)

## 2016-09-27 MED ORDER — LOSARTAN POTASSIUM 25 MG PO TABS: 25.0000 mg | ORAL_TABLET | Freq: Every day | ORAL | 3 refills | Status: DC

## 2016-09-27 MED ORDER — TORSEMIDE 20 MG PO TABS: 20.0000 mg | ORAL_TABLET | Freq: Two times a day (BID) | ORAL | 3 refills | Status: DC

## 2016-09-27 MED FILL — LOSARTAN POTASSIUM 25 MG TA: 25 | 90 days supply | Qty: 90 | Fill #0

## 2016-09-27 MED FILL — TORSEMIDE 20 MG TABLET: 20 | 30 days supply | Qty: 60 | Fill #0

## 2016-09-27 NOTE — Patient Instructions (Addendum)
Stop Valsartan   Stop Lasix  Start Losartan 25 mg (1 tab) daily  Start Torsemide 20 mg (1 tab) daily  Labs drawn today (if we do not call you, then your lab work was stable)   Your physician recommends that you schedule a follow-up appointment in: 2 weeks with [place] PA    .

## 2016-09-27 NOTE — Progress Notes (Signed)
Advanced Heart Failure Clinic Note   Patient ID: Angela Shepherd, female   DOB: Mar 26, 1950, 66 y.o.   MRN: [id number] PCP: Dr. Birdie Riddle  HPI: Angela Shepherd is a 66 y.o. 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 She has a h/o HOCM, bradycardia s/p PPM in the 90s at [facility]. PMHx also notable for HTN and prior tobacco abuse (quit 02/2010).   Was admitted in December 2011 with acute HF. Echo with EF 30-35%, There was also evidence of ASH with septum of 1.7 PW = 1.9. Diuresed and scheduled for outpt cath.  Cath 12/2009: Normal coronaries. EF 40-45%.  Central aortic pressure was 86/47 with a mean of 62.  LV pressure is 74/13 with an EDP of 2.  Right atrial pressure mean of 3.  RV pressure 26/3 with an EDP of 5.  PA pressure 28/5 with a mean of 16.  Pulmonary capillary wedge pressure mean of 7.  Fick cardiac output was 3.4 and cardiac index 2.1.   Echo in March 2012:  EF 50-55%. Grade 2 diastolic dysfunction. IVS 1.4cm.  Echo 4/14: EF 40-45%  ECHO 09/03/13: EF 40-45% Echo 4/16: EF 45-50%  Was seen by EP and noted to have a high percentage of RV pacing (99%) and it was felt this was worsening LV function. Underwent upgrade to [facility] Jude CRT-P in 5/14.  Echo in 4/16 showed EF 45-50% with mild LVH.   Pt admitted to [facility] 08/2016 for CHF. Required BiPAP on admission but quickly weaned off O2. Pt denied medical or dietary non-compliance. Was told about Cardiomems device which she would like to pursue if she is a candidate.   Pt states she was admitted to [facility] again last week (09/20/16) and diuresed 8 lbs. No changes were made to her chronic medications.   She presents today for post hospital follow up. She is frustrated with 2 recent admissions to [facility]. She is unsure what brought on the admission last month, but states last week her granddaughter was in town and they ate out often.  At baseline she does not have SOB with activity.  When her fluid is up, she has to take a break  walking around the grocery store. Denies SOB with changing clothes or bathing. She did have orthopnea last week prior to her admission to [facility]. Takes all medications as directed, and measures her salt/fluid intake meticulously. Denies lightheadedness or dizziness. She works at [employer] in [employer], but has been out of work since 09/20/16. She does have occasional chest pressure when her fluid is up and she gets SOB.   Echo 08/22/16 At [facility]: LVEF 40-45%, mild MR, Mild TR  SH: Nonsmoker, works at [employer], lives in [place].   Review of systems complete and found to be negative unless listed in HPI.    Past Medical History:  Diagnosis Date  . Bradycardia    a. initial PPM placed 1994 at [facility] with gen change 2002. b. 1*AVB & intermittent 2nd degree AVB + CHF --> upgrade to [facility] Jude BiV PPM 05/08/12.  . Chronic systolic CHF (congestive heart failure) ([place])    a. EF 30-35% dx in 2011 -> most recent 40-45% by echo 04/2012. b. s/p upgrade to BiV-PPM 05/08/12. c. Med titration limited by hypotension.  . Cluster headache syndrome   . Hypertr obst cardiomyop   . Hypokalemia   . Hypotension   . Paroxysmal atrial fibrillation (HCC)    a. identified on PPM interrogation b. longest episode  1 hour; if burden increases, will need [place]   . Pneumonia 2008    Current Outpatient Prescriptions  Medication Sig Dispense Refill  . albuterol (PROVENTIL HFA;VENTOLIN HFA) 108 (90 Base) MCG/ACT inhaler Inhale 1-2 puffs into the lungs every 6 (six) hours as needed for wheezing or shortness of breath. 1 Inhaler 1  . ALPRAZolam (XANAX) 0.25 MG tablet Take 1 tablet (0.25 mg total) by mouth 2 (two) times daily as needed for anxiety. 30 tablet 3  . AMBULATORY NON FORMULARY MEDICATION Medication Name: 100% O2 15 leters a min for 15-20 mins  Via Nasal cannual 1 Bottle 2  . aspirin 81 MG chewable tablet Chew 81 mg by mouth daily.    . carvedilol (COREG) 12.5 MG tablet TAKE 1 TABLET (12.5 MG TOTAL) BY MOUTH 2 (TWO) TIMES  DAILY WITH A MEAL. 60 tablet 3  . cyclobenzaprine (FLEXERIL) 5 MG tablet TAKE 1 TABLET (5 MG TOTAL) BY MOUTH 3 (THREE) TIMES DAILY AS NEEDED FOR MUSCLE SPASMS. 30 tablet 2  . diclofenac sodium (VOLTAREN) 1 % GEL Apply 2 g topically 4 (four) times daily. 3 Tube 0  . FLUoxetine (PROZAC) 20 MG capsule Take 1 capsule (20 mg total) by mouth daily. 30 capsule 3  . fluticasone (FLONASE) 50 MCG/ACT nasal spray PLACE 2 SPRAYS INTO BOTH NOSTRILS DAILY. 16 g 5  . furosemide (LASIX) 80 MG tablet Take 0.5 tablets (40 mg total) by mouth 2 (two) times daily. Take extra tabs as directed for swelling 45 tablet 3  . ipratropium-albuterol (DUONEB) 0.5-2.5 (3) MG/3ML SOLN USE 1 VIAL (3ML) BY NEBULIZER EVERY 4 TO 6 HOURS AS NEEDED 90 mL 0  . loratadine (CLARITIN) 10 MG tablet Take 1 tablet (10 mg total) by mouth daily. 30 tablet 5  . nicotine (NICODERM CQ - DOSED IN MG/24 HOURS) 14 mg/24hr patch Place 1 patch (14 mg total) onto the skin daily. 28 patch 0  . potassium chloride SA (K-DUR,KLOR-CON) 20 MEQ tablet Take 1 tablet (20 mEq total) by mouth daily. 90 tablet 3  . PULMICORT FLEXHALER 180 MCG/ACT inhaler   1  . Spacer/Aero-Holding Chambers (AEROCHAMBER MV) inhaler Use as instructed with Symbicort 1 each 0  . spironolactone (ALDACTONE) 25 MG tablet TAKE 1/2 TABLET BY MOUTH DAILY 15 tablet 6  . SUMAtriptan (IMITREX) 50 MG tablet TAKE 1 TABLET AT ONSET OF HEADACHE. MAY REPEAT IN 2 HOURS IF HEADACHE PERSISTS OR RECURS. 10 tablet 6  . SYMBICORT 160-4.5 MCG/ACT inhaler INHALE 2 PUFFS BY MOUTH INTO THE LUNGS 2 TIMES A DAY (Patient taking differently: INHALE 2 PUFFS BY MOUTH INTO THE LUNGS 2 TIMES A DAY as needed) 10.2 g 3  . topiramate (TOPAMAX) 50 MG tablet Take 50 mg by mouth daily.    . valsartan (DIOVAN) 40 MG tablet TAKE 1 TABLET BY MOUTH DAILY 30 tablet 5   No current facility-administered medications for this encounter.    PHYSICAL EXAM: Vitals:   09/27/16 1528  BP: 128/74  Pulse: 71  SpO2: 100%  Weight: 121  lb 8 oz (55.1 kg)    Wt Readings from Last 3 Encounters:  09/27/16 121 lb 8 oz (55.1 kg)  09/12/16 123 lb (55.8 kg)  09/07/16 122 lb 4 oz (55.5 kg)    General: Female, NAD. No resp difficulty. HEENT: Normal Neck: Supple. JVP 7-8 with mild HJR. Carotids 2+ bilat; no bruits. No thyromegaly or nodule noted. Cor: PMI nondisplaced. RRR, No M/G/R noted. PPM L upper chest. Mild TR murmur. Lungs: CTAB, normal effort.  Abdomen: Soft, non-tender, non-distended, no HSM. No bruits or masses. +BS  Extremities: No cyanosis, clubbing, or rash. Trace ankle edema.  Neuro: Alert & orientedx3, cranial nerves grossly intact. moves all 4 extremities w/o difficulty. Affect pleasant   ASSESSMENT & PLAN: 1. Chronic systolic CHF: EF 86% s/p CRT-P upgrade 05/2012 (due to chronic RV pacing)  - NYHA class II-III symptoms.  - Volume status mildly elevated on exam.  - Stop lasix. Change to torsemide 20 mg BID. BMET today and follow up 2 weeks.  - Continue Coreg 12.5mg  BID.  - Continue spiro 12.5mg  daily.  - Stop valsartan. Start losartan 25 mg daily.  - Reinforced fluid restriction to < 2 L daily, sodium restriction to less than 2000 mg daily, and the importance of daily weights.   2. H/o HCM  3. HTN - Meds as above.  4. Tobacco use quit 2012 - No further smoking.   Meds as above. BMET today and follow up 2 weeks. Ok to return to work.    Shirley Friar, PA-C  09/27/2016  Patient seen and examined with the above-signed Advanced Practice Provider and/or Housestaff. I personally reviewed laboratory data, imaging studies and relevant notes. I independently examined the patient and formulated the important aspects of the plan. I have edited the note to reflect any of my changes or salient points. I have personally discussed the plan with the patient and/or family.  NYHA III. Several recent admissions for volume overload. Volume status increasingly difficult to manage.Will switch lasix to torsemide and  see if this helps. Reinforced need for daily weights and reviewed use of sliding scale diuretics. Switch valsartan to losartan. Long talk about options to manage fluid. She is interested in Cardiomems. We will see result of torsemide. If still struggling, can consider implant. No evidence of LVOT obstruction on recent echo at [facility].   Total time spent 35 minutes. Over half that time spent discussing above.    Glori Bickers, MD  5:04 PM

## 2016-10-01 ENCOUNTER — Emergency Department (HOSPITAL_BASED_OUTPATIENT_CLINIC_OR_DEPARTMENT_OTHER): Payer: 59

## 2016-10-01 ENCOUNTER — Encounter (HOSPITAL_BASED_OUTPATIENT_CLINIC_OR_DEPARTMENT_OTHER): Payer: Self-pay | Admitting: *Deleted

## 2016-10-01 ENCOUNTER — Emergency Department (HOSPITAL_BASED_OUTPATIENT_CLINIC_OR_DEPARTMENT_OTHER)
Admission: EM | Admit: 2016-10-01 | Discharge: 2016-10-01 | Disposition: A | Discharge status: Home / Self Care | Payer: 59 | Attending: Emergency Medicine | Admitting: Emergency Medicine

## 2016-10-01 DIAGNOSIS — R079 Chest pain, unspecified: Secondary | ICD-10-CM | POA: Diagnosis not present

## 2016-10-01 DIAGNOSIS — Z950 Presence of cardiac pacemaker: Secondary | ICD-10-CM | POA: Insufficient documentation

## 2016-10-01 DIAGNOSIS — Z791 Long term (current) use of non-steroidal anti-inflammatories (NSAID): Secondary | ICD-10-CM | POA: Diagnosis not present

## 2016-10-01 DIAGNOSIS — J449 Chronic obstructive pulmonary disease, unspecified: Secondary | ICD-10-CM | POA: Diagnosis not present

## 2016-10-01 DIAGNOSIS — Z79899 Other long term (current) drug therapy: Secondary | ICD-10-CM | POA: Diagnosis not present

## 2016-10-01 DIAGNOSIS — I110 Hypertensive heart disease with heart failure: Secondary | ICD-10-CM | POA: Insufficient documentation

## 2016-10-01 DIAGNOSIS — Z87891 Personal history of nicotine dependence: Secondary | ICD-10-CM | POA: Diagnosis not present

## 2016-10-01 DIAGNOSIS — I5042 Chronic combined systolic (congestive) and diastolic (congestive) heart failure: Secondary | ICD-10-CM | POA: Diagnosis not present

## 2016-10-01 DIAGNOSIS — Z7982 Long term (current) use of aspirin: Secondary | ICD-10-CM | POA: Diagnosis not present

## 2016-10-01 DIAGNOSIS — R0789 Other chest pain: Secondary | ICD-10-CM | POA: Diagnosis not present

## 2016-10-01 LAB — COMPREHENSIVE METABOLIC PANEL
ALK PHOS: 59 U/L (ref 38–126)
ALT: 17 U/L (ref 14–54)
ANION GAP: 9 (ref 5–15)
AST: 25 U/L (ref 15–41)
Albumin: 4.2 g/dL (ref 3.5–5.0)
BILIRUBIN TOTAL: 0.7 mg/dL (ref 0.3–1.2)
BUN: 22 mg/dL — AB (ref 6–20)
CALCIUM: 8.9 mg/dL (ref 8.9–10.3)
CO2: 25 mmol/L (ref 22–32)
Chloride: 102 mmol/L (ref 101–111)
Creatinine, Ser: 1.09 mg/dL — ABNORMAL HIGH (ref 0.44–1.00)
GFR calc Af Amer: >60 mL/min (ref >60)
GFR calc non Af Amer: 52 mL/min — ABNORMAL LOW (ref >60)
GLUCOSE: 107 mg/dL — AB (ref 65–99)
Potassium: 3.7 mmol/L (ref 3.5–5.1)
SODIUM: 136 mmol/L (ref 135–145)
TOTAL PROTEIN: 7.5 g/dL (ref 6.5–8.1)

## 2016-10-01 LAB — CBC
HEMATOCRIT: 41.2 % (ref 36.0–46.0)
HEMOGLOBIN: 14 g/dL (ref 12.0–15.0)
MCH: 32.9 pg (ref 26.0–34.0)
MCHC: 34 g/dL (ref 30.0–36.0)
MCV: 96.7 fL (ref 78.0–100.0)
Platelets: 235 10*3/uL (ref 150–400)
RBC: 4.26 MIL/uL (ref 3.87–5.11)
RDW: 14.3 % (ref 11.5–15.5)
WBC: 7.9 10*3/uL (ref 4.0–10.5)

## 2016-10-01 LAB — D-DIMER, QUANTITATIVE: D-Dimer, Quant: 0.83 ug/mL-FEU — ABNORMAL HIGH (ref 0.00–0.50)

## 2016-10-01 LAB — I-STAT TROPONIN, ED: TROPONIN I, POC: 0.01 ng/mL (ref 0.00–0.08)

## 2016-10-01 LAB — BRAIN NATRIURETIC PEPTIDE: B Natriuretic Peptide: 533.2 pg/mL — ABNORMAL HIGH (ref 0.0–100.0)

## 2016-10-01 MED ORDER — IOPAMIDOL (ISOVUE-370) INJECTION 76%
100.0000 mL | Freq: Once | INTRAVENOUS | Status: AC | PRN
  Administered 2016-10-01: 100 mL via INTRAVENOUS

## 2016-10-01 MED ORDER — ALUM & MAG HYDROXIDE-SIMETH 200-200-20 MG/5ML PO SUSP
30.0000 mL | Freq: Once | ORAL | Status: AC
  Administered 2016-10-01: 30 mL via ORAL
  Filled 2016-10-01: qty 30

## 2016-10-01 MED ORDER — ONDANSETRON HCL 4 MG/2ML IJ SOLN
4.0000 mg | Freq: Once | INTRAMUSCULAR | Status: AC
  Administered 2016-10-01: 4 mg via INTRAVENOUS
  Filled 2016-10-01: qty 2

## 2016-10-01 NOTE — ED Triage Notes (Addendum)
States she woke at 5am with pain in her chest and nausea. States she was admitted to [facility] 2 times in the past month for CHF. Her medications were changed last week and she thinks the nausea is from the med change.

## 2016-10-03 NOTE — ED Provider Notes (Signed)
[facility] DEPT Provider Note   CSN: [id number] Arrival date & time: 10/01/16  1154     History   Chief Complaint Chief Complaint  Patient presents with  . Chest Pain    HPI Angela Shepherd is a 66 y.o. female.  HPI Patient's a 66 year old female with a history of pacemaker and congestive heart failure who presents emergency department with chest discomfort and nausea.  She states she was recently admitted to [facility] for possible mild CHF exacerbation.  She denies fevers and chills.  Denies vomiting.  No melena or hematochezia.  No active chest pain at this time.  Reports some mild upper abdominal discomfort.  No lower abdominal pain.  Denies urinary symptoms.  No back or flank pain.  Past Medical History:  Diagnosis Date  . Bradycardia    a. initial PPM placed 1994 at [facility] with gen change 2002. b. 1*AVB & intermittent 2nd degree AVB + CHF --> upgrade to [facility]. Jude BiV PPM 05/08/12.  . Chronic systolic CHF (congestive heart failure) ([place])    a. EF 30-35% dx in 2011 -> most recent 40-45% by echo 04/2012. b. s/p upgrade to BiV-PPM 05/08/12. c. Med titration limited by hypotension.  . Cluster headache syndrome   . Hypertr obst cardiomyop   . Hypokalemia   . Hypotension   . Paroxysmal atrial fibrillation (HCC)    a. identified on PPM interrogation b. longest episode 1 hour; if burden increases, will need [place]   . Pneumonia 2008    Patient Active Problem List   Diagnosis Date Noted  . Osteoporosis 09/07/2016  . Right hip pain 06/28/2016  . Left foot pain 06/13/2016  . Chronic systolic heart failure ([place]) 03/04/2015  . Episodic cluster headache, not intractable 11/05/2014  . Thrombophlebitis of arm, left 04/27/2014  . Elevated troponin 04/12/2014  . Hypokalemia 04/12/2014  . Congestive cardiomyopathy ([place]) 04/11/2014  . Status post biventricular pacemaker 04/11/2014  . Hx of migraine headaches 04/11/2014  . Shortness of breath 04/11/2014  . Hyperkalemia 04/10/2014    . Leukocytosis 04/10/2014  . Migraine-cluster headache syndrome 12/11/2013  . Anxiety and depression 01/25/2013  . Back pain 05/01/2012  . Bradycardia 10/08/2011  . Encounter for routine gynecological examination 05/04/2011  . Screening for malignant neoplasm of cervix 05/04/2011  . Post-menopausal 05/04/2011  . Chronic diastolic heart failure ([place]) 01/23/2011  . Chronic allergic rhinitis 09/29/2010  . COPD (chronic obstructive pulmonary disease) ([place]) 08/30/2010  . Former smoker 04/06/2010  . Essential hypertension 01/20/2010  . CARDIOMYOPATHY, CONSTRICTIVE/RESTRICTIVE 01/20/2010  . SYSTOLIC HEART FAILURE, ACUTE ON CHRONIC 01/20/2010    Past Surgical History:  Procedure Laterality Date  . BI-VENTRICULAR PACEMAKER UPGRADE N/A 05/08/2012   upgrade to [facility] (CRT-P) by Dr Rayann Heman  . PACEMAKER INSERTION  1994; 2002; 05/08/2012  . TONSILLECTOMY  ~ 1959  . TUBAL LIGATION  1984?    OB History    Gravida Para Term Preterm AB Living   0 0 0 0 0     SAB TAB Ectopic Multiple Live Births   0 0 0           Home Medications    Prior to Admission medications   Medication Sig Start Date End Date Taking? Authorizing Provider  albuterol (PROVENTIL HFA;VENTOLIN HFA) 108 (90 Base) MCG/ACT inhaler Inhale 1-2 puffs into the lungs every 6 (six) hours as needed for wheezing or shortness of breath. 07/30/16   Byrum, Rose Fillers, MD  ALPRAZolam Duanne Moron) 0.25 MG tablet Take 1 tablet (0.25  mg total) by mouth 2 (two) times daily as needed for anxiety. 08/24/16   Midge Minium, MD  AMBULATORY NON FORMULARY MEDICATION Medication Name: 100% O2 15 leters a min for 15-20 mins  Via Nasal cannual 07/13/14   Pieter Partridge, DO  aspirin 81 MG chewable tablet Chew 81 mg by mouth daily.    [provider]  carvedilol (COREG) 12.5 MG tablet TAKE 1 TABLET (12.5 MG TOTAL) BY MOUTH 2 (TWO) TIMES DAILY WITH A MEAL. 06/11/16   Bensimhon, Shaune Pascal, MD  cyclobenzaprine (FLEXERIL) 5 MG tablet TAKE 1 TABLET (5 MG  TOTAL) BY MOUTH 3 (THREE) TIMES DAILY AS NEEDED FOR MUSCLE SPASMS. 04/18/15   Midge Minium, MD  diclofenac sodium (VOLTAREN) 1 % GEL Apply 2 g topically 4 (four) times daily. 06/12/16   Hudnall, Sharyn Lull, MD  FLUoxetine (PROZAC) 20 MG capsule Take 1 capsule (20 mg total) by mouth daily. 07/27/16   Midge Minium, MD  fluticasone (FLONASE) 50 MCG/ACT nasal spray PLACE 2 SPRAYS INTO BOTH NOSTRILS DAILY. 08/23/16   Collene Gobble, MD  ipratropium-albuterol (DUONEB) 0.5-2.5 (3) MG/3ML SOLN USE 1 VIAL (3ML) BY NEBULIZER EVERY 4 TO 6 HOURS AS NEEDED 02/14/15   Midge Minium, MD  loratadine (CLARITIN) 10 MG tablet Take 1 tablet (10 mg total) by mouth daily. 03/15/15   Collene Gobble, MD  losartan (COZAAR) 25 MG tablet Take 1 tablet (25 mg total) by mouth daily. 09/27/16 12/26/16  Bensimhon, Shaune Pascal, MD  nicotine (NICODERM CQ - DOSED IN MG/24 HOURS) 14 mg/24hr patch Place 1 patch (14 mg total) onto the skin daily. 04/13/14   Reyne Dumas, MD  potassium chloride SA (K-DUR,KLOR-CON) 20 MEQ tablet Take 1 tablet (20 mEq total) by mouth daily. 09/19/16   Conrad , NP  PULMICORT [facility] 180 MCG/ACT inhaler  03/15/15   [provider]  Spacer/Aero-Holding Chambers (AEROCHAMBER MV) inhaler Use as instructed with Symbicort 03/15/15   Collene Gobble, MD  spironolactone (ALDACTONE) 25 MG tablet TAKE 1/2 TABLET BY MOUTH DAILY 07/06/15   Clegg, Amy D, NP  SUMAtriptan (IMITREX) 50 MG tablet TAKE 1 TABLET AT ONSET OF HEADACHE. MAY REPEAT IN 2 HOURS IF HEADACHE PERSISTS OR RECURS. 10/17/15   Midge Minium, MD  SYMBICORT 160-4.5 MCG/ACT inhaler INHALE 2 PUFFS BY MOUTH INTO THE LUNGS 2 TIMES A DAY Patient taking differently: INHALE 2 PUFFS BY MOUTH INTO THE LUNGS 2 TIMES A DAY as needed 03/28/16   Midge Minium, MD  topiramate (TOPAMAX) 50 MG tablet Take 50 mg by mouth daily.    [provider]  torsemide (DEMADEX) 20 MG tablet Take 1 tablet (20 mg total) by mouth 2 (two) times daily.  09/27/16 12/26/16  Bensimhon, Shaune Pascal, MD    Family History Family History  Problem Relation Age of Onset  . Heart disease Sister   . Dementia Mother   . Cancer Sister 96       uterine  . Diabetes Unknown   . Cardiomyopathy Sister   . Diabetes Brother   . Colon cancer Neg Hx   . Esophageal cancer Neg Hx   . Stomach cancer Neg Hx   . Rectal cancer Neg Hx     Social History Social History  Substance Use Topics  . Smoking status: Former Smoker    Packs/day: 3.00  . Smokeless tobacco: Never Used  . Alcohol use 1.2 oz/week    2 Glasses of wine per week  Allergies   Prednisone   Review of Systems Review of Systems  All other systems reviewed and are negative.    Physical Exam Updated Vital Signs BP 115/71   Pulse 64   Temp 98.2 F (36.8 C) (Oral)   Resp 15   Ht 5\' 2"  (1.575 m)   Wt 53.2 kg (117 lb 3.2 oz)   LMP  (LMP Unknown)   SpO2 98%   BMI 21.44 kg/m   Physical Exam  Constitutional: She is oriented to person, place, and time. She appears well-developed and well-nourished. No distress.  HENT:  Head: Normocephalic and atraumatic.  Eyes: EOM are normal.  Neck: Normal range of motion.  Cardiovascular: Normal rate, regular rhythm and normal heart sounds.   Pulmonary/Chest: Effort normal and breath sounds normal.  Abdominal: Soft. She exhibits no distension. There is no tenderness.  Musculoskeletal: Normal range of motion.  Neurological: She is alert and oriented to person, place, and time.  Skin: Skin is warm and dry.  Psychiatric: She has a normal mood and affect. Judgment normal.  Nursing note and vitals reviewed.    ED Treatments / Results  Labs (all labs ordered are listed, but only abnormal results are displayed) Labs Reviewed  COMPREHENSIVE METABOLIC PANEL - Abnormal; Notable for the following:       Result Value   Glucose, Bld 107 (*)    BUN 22 (*)    Creatinine, Ser 1.09 (*)    GFR calc non Af Amer 52 (*)    All other components  within normal limits  BRAIN NATRIURETIC PEPTIDE - Abnormal; Notable for the following:    B Natriuretic Peptide 533.2 (*)    All other components within normal limits  D-DIMER, QUANTITATIVE (NOT AT [facility]) - Abnormal; Notable for the following:    D-Dimer, Quant 0.83 (*)    All other components within normal limits  CBC  I-STAT TROPONIN, ED    EKG  EKG Interpretation  Date/Time:  Monday October 01 2016 12:05:39 EDT Ventricular Rate:  68 PR Interval:    QRS Duration: 134 QT Interval:  464 QTC Calculation: 494 R Axis:   -125 Text Interpretation:  Atrial-sensed ventricular-paced rhythm No further analysis attempted due to paced rhythm Baseline wander in lead(s) II III aVF Confirmed by Orpah Greek [phone]) on 10/02/2016 7:42:01 AM       Radiology Dg Chest 2 View  Result Date: 10/01/2016 CLINICAL DATA:  Chest pain and nausea. EXAM: CHEST  2 VIEW COMPARISON:  09/20/2016 FINDINGS: Chronic scarring along the lateral aspect of the right chest. Stable appearance of the left biventricular cardiac pacemaker. Prominent interstitial lung markings appear chronic. Heart size is upper limits normal but stable. Atherosclerotic calcifications at the aortic arch. Trachea is midline. No large pleural effusions. No acute bone abnormality. IMPRESSION: Chronic lung changes without acute findings. Electronically Signed   By: Markus Daft M.D.   On: 10/01/2016 12:34   Ct Angio Chest Pe W And/or Wo Contrast  Result Date: 10/01/2016 CLINICAL DATA:  Patient with chest pain. Nausea. History of congestive heart failure. EXAM: CT ANGIOGRAPHY CHEST WITH CONTRAST TECHNIQUE: Multidetector CT imaging of the chest was performed using the standard protocol during bolus administration of intravenous contrast. Multiplanar CT image reconstructions and MIPs were obtained to evaluate the vascular anatomy. CONTRAST:  100 cc Isovue 370 COMPARISON:  CT chest 10/21/2014 FINDINGS: Cardiovascular: Left anterior chest wall  pacer apparatus. Heart is enlarged. No pericardial effusion. Thoracic aortic vascular calcifications. Enlarged main pulmonary artery measuring  3.4 cm. Adequate opacification of the pulmonary arterial system. No abnormal filling defect identified to suggest acute pulmonary embolus. Mediastinum/Nodes: No enlarged axillary, mediastinal or hilar lymphadenopathy. There is a 2 cm nodule involving the right thyroid lobe (image 15; series 5). Normal appearance of the esophagus. Lungs/Pleura: Central airways are patent. Stable appearance of right pleural thickening and subpleural rounded atelectasis involving the mid and lower right lung. Stable 4 mm left lower lobe nodule (image 50; series 6), dating back to 2011, compatible with benign process. Probable atelectasis within the lingula. Upper Abdomen: Partially exophytic, partially visualized cystic lesion superior pole left kidney. Musculoskeletal: No aggressive or acute appearing osseous lesions. Review of the MIP images confirms the above findings. IMPRESSION: 1. No evidence for acute pulmonary embolus. 2. No acute process within the chest. 3. Stable appearance of the right hemithorax with pleural thickening and subpleural rounded atelectasis. 4. Aortic Atherosclerosis (ICD10-I70.0). Electronically Signed   By: Lovey Newcomer M.D.   On: 10/01/2016 13:42    Procedures Procedures (including critical care time)  Medications Ordered in ED Medications  ondansetron (ZOFRAN) injection 4 mg (4 mg Intravenous Given 10/01/16 1237)  iopamidol (ISOVUE-370) 76 % injection 100 mL (100 mLs Intravenous Contrast Given 10/01/16 1315)  alum & mag hydroxide-simeth (MAALOX/MYLANTA) 200-200-20 MG/5ML suspension 30 mL (30 mLs Oral Given 10/01/16 1425)     Initial Impression / Assessment and Plan / ED Course  I have reviewed the triage vital signs and the nursing notes.  Pertinent labs & imaging results that were available during my care of the patient were reviewed by me and  considered in my medical decision making (see chart for details).     Workup in the emergency department without significant abnormality.  Vital signs remained stable.  No signs of pulmonary embolus on CT in GU.  Doubt ACS.  No significant shortness of breath.  Doubt CHF exacerbation.  Primary care follow-up.  Patient understands return to the ER for new or worsening symptoms  Final Clinical Impressions(s) / ED Diagnoses   Final diagnoses:  Chest pain, unspecified type    New Prescriptions Discharge Medication List as of 10/01/2016  2:19 PM       Jola Schmidt, MD 10/03/16 0107

## 2016-10-11 ENCOUNTER — Ambulatory Visit (HOSPITAL_COMMUNITY)
Admission: RE | Admit: 2016-10-11 | Discharge: 2016-10-11 | Disposition: A | Discharge status: Home / Self Care | Payer: 59 | Source: Ambulatory Visit | Attending: Cardiology | Admitting: Cardiology

## 2016-10-11 ENCOUNTER — Encounter (HOSPITAL_COMMUNITY): Payer: Self-pay

## 2016-10-11 VITALS — BP 110/74 | HR 74 | Wt 124.6 lb

## 2016-10-11 DIAGNOSIS — R0789 Other chest pain: Secondary | ICD-10-CM | POA: Diagnosis not present

## 2016-10-11 DIAGNOSIS — I420 Dilated cardiomyopathy: Secondary | ICD-10-CM | POA: Diagnosis not present

## 2016-10-11 DIAGNOSIS — I5022 Chronic systolic (congestive) heart failure: Secondary | ICD-10-CM | POA: Diagnosis not present

## 2016-10-11 DIAGNOSIS — I10 Essential (primary) hypertension: Secondary | ICD-10-CM | POA: Diagnosis not present

## 2016-10-11 DIAGNOSIS — Z87891 Personal history of nicotine dependence: Secondary | ICD-10-CM | POA: Diagnosis not present

## 2016-10-11 LAB — BASIC METABOLIC PANEL
Anion gap: 8 (ref 5–15)
BUN: 33 mg/dL — AB (ref 6–20)
CALCIUM: 9 mg/dL (ref 8.9–10.3)
CHLORIDE: 100 mmol/L — AB (ref 101–111)
CO2: 27 mmol/L (ref 22–32)
CREATININE: 1.13 mg/dL — AB (ref 0.44–1.00)
GFR, EST AFRICAN AMERICAN: 58 mL/min — AB (ref >60)
GFR, EST NON AFRICAN AMERICAN: 50 mL/min — AB (ref >60)
Glucose, Bld: 92 mg/dL (ref 65–99)
Potassium: 3.9 mmol/L (ref 3.5–5.1)
SODIUM: 135 mmol/L (ref 135–145)

## 2016-10-11 NOTE — Progress Notes (Signed)
Advanced Heart Failure Clinic Note   Patient ID: Angela Shepherd, female   DOB: May 12, 1950, 66 y.o.   MRN: [id number] PCP: Dr. Birdie Riddle  HPI: Angela [facility] is a 66 y.o. 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 She has a h/o HOCM, bradycardia s/p PPM in the 90s at [facility]. PMHx also notable for HTN and prior tobacco abuse (quit 02/2010).   Was admitted in December 2011 with acute HF. Echo with EF 30-35%, There was also evidence of ASH with septum of 1.7 PW = 1.9. Diuresed and scheduled for outpt cath.  Cath 12/2009: Normal coronaries. EF 40-45%.  Central aortic pressure was 86/47 with a mean of 62.  LV pressure is 74/13 with an EDP of 2.  Right atrial pressure mean of 3.  RV pressure 26/3 with an EDP of 5.  PA pressure 28/5 with a mean of 16.  Pulmonary capillary wedge pressure mean of 7.  Fick cardiac output was 3.4 and cardiac index 2.1.   Echo in March 2012:  EF 50-55%. Grade 2 diastolic dysfunction. IVS 1.4cm.  Echo 4/14: EF 40-45%  ECHO 09/03/13: EF 40-45% Echo 4/16: EF 45-50%  Was seen by EP and noted to have a high percentage of RV pacing (99%) and it was felt this was worsening LV function. Underwent upgrade to [facility]. Jude CRT-P in 5/14.  Echo in 4/16 showed EF 45-50% with mild LVH.   Pt admitted to [facility] 08/2016 for CHF. Required BiPAP on admission but quickly weaned off O2. Pt denied medical or dietary non-compliance. Was told about Cardiomems device which she would like to pursue if she is a candidate.   Pt states she was admitted to [facility] again last week (09/20/16) and diuresed 8 lbs. No changes were made to her chronic medications.   She presents today for 2 week follow up. At last visit switched to torsemide. She says this is the best she has felt since 2009, though her weight is actually up 3 lbs by our scales. Weight stable ~ 120 at home.  She denies SOB with exertion. Denies peripheral edema. She says she is not orthopneic for the first time in "years".   She was seen in ED last week for CP, atypical presentation with negative work up. No PE on CT or evidence of CHF. Negative troponin.  Pt had associated Nausea and upper abdominal pain. She has had none since. She continues to watch her salt and fluid closely.   Of note, home BP measurements have been as low as 72 systolic, on repeated occasions. She denies any lightheadedness, dizziness, near-syncope, or decreased urine output.   Echo 08/22/16 At [facility]: LVEF 40-45%, mild MR, Mild TR  SH: Nonsmoker, works at [employer], lives in [place].   Review of systems complete and found to be negative unless listed in HPI.    Past Medical History:  Diagnosis Date  . Bradycardia    a. initial PPM placed 1994 at [facility] with gen change 2002. b. 1*AVB & intermittent 2nd degree AVB + CHF --> upgrade to [facility]. Jude BiV PPM 05/08/12.  . Chronic systolic CHF (congestive heart failure) ([place])    a. EF 30-35% dx in 2011 -> most recent 40-45% by echo 04/2012. b. s/p upgrade to BiV-PPM 05/08/12. c. Med titration limited by hypotension.  . Cluster headache syndrome   . Hypertr obst cardiomyop   . Hypokalemia   . Hypotension   . Paroxysmal atrial fibrillation ([place])    a.  identified on PPM interrogation b. longest episode 1 hour; if burden increases, will need [place]   . Pneumonia 2008    Current Outpatient Prescriptions  Medication Sig Dispense Refill  . albuterol (PROVENTIL HFA;VENTOLIN HFA) 108 (90 Base) MCG/ACT inhaler Inhale 1-2 puffs into the lungs every 6 (six) hours as needed for wheezing or shortness of breath. 1 Inhaler 1  . ALPRAZolam (XANAX) 0.25 MG tablet Take 1 tablet (0.25 mg total) by mouth 2 (two) times daily as needed for anxiety. 30 tablet 3  . AMBULATORY NON FORMULARY MEDICATION Medication Name: 100% O2 15 leters a min for 15-20 mins  Via Nasal cannual 1 Bottle 2  . aspirin 81 MG chewable tablet Chew 81 mg by mouth daily.    . carvedilol (COREG) 12.5 MG tablet TAKE 1 TABLET (12.5 MG TOTAL) BY MOUTH 2  (TWO) TIMES DAILY WITH A MEAL. 60 tablet 3  . cyclobenzaprine (FLEXERIL) 5 MG tablet TAKE 1 TABLET (5 MG TOTAL) BY MOUTH 3 (THREE) TIMES DAILY AS NEEDED FOR MUSCLE SPASMS. 30 tablet 2  . diclofenac sodium (VOLTAREN) 1 % GEL Apply 2 g topically 4 (four) times daily. 3 Tube 0  . FLUoxetine (PROZAC) 20 MG capsule Take 1 capsule (20 mg total) by mouth daily. 30 capsule 3  . fluticasone (FLONASE) 50 MCG/ACT nasal spray PLACE 2 SPRAYS INTO BOTH NOSTRILS DAILY. 16 g 5  . ipratropium-albuterol (DUONEB) 0.5-2.5 (3) MG/3ML SOLN USE 1 VIAL (3ML) BY NEBULIZER EVERY 4 TO 6 HOURS AS NEEDED 90 mL 0  . loratadine (CLARITIN) 10 MG tablet Take 1 tablet (10 mg total) by mouth daily. 30 tablet 5  . losartan (COZAAR) 25 MG tablet Take 1 tablet (25 mg total) by mouth daily. 30 tablet 3  . nicotine (NICODERM CQ - DOSED IN MG/24 HOURS) 14 mg/24hr patch Place 1 patch (14 mg total) onto the skin daily. 28 patch 0  . potassium chloride SA (K-DUR,KLOR-CON) 20 MEQ tablet Take 1 tablet (20 mEq total) by mouth daily. 90 tablet 3  . PULMICORT FLEXHALER 180 MCG/ACT inhaler   1  . Spacer/Aero-Holding Chambers (AEROCHAMBER MV) inhaler Use as instructed with Symbicort 1 each 0  . spironolactone (ALDACTONE) 25 MG tablet TAKE 1/2 TABLET BY MOUTH DAILY 15 tablet 6  . SUMAtriptan (IMITREX) 50 MG tablet TAKE 1 TABLET AT ONSET OF HEADACHE. MAY REPEAT IN 2 HOURS IF HEADACHE PERSISTS OR RECURS. 10 tablet 6  . SYMBICORT 160-4.5 MCG/ACT inhaler INHALE 2 PUFFS BY MOUTH INTO THE LUNGS 2 TIMES A DAY (Patient taking differently: INHALE 2 PUFFS BY MOUTH INTO THE LUNGS 2 TIMES A DAY as needed) 10.2 g 3  . topiramate (TOPAMAX) 50 MG tablet Take 50 mg by mouth daily.    Marland Kitchen torsemide (DEMADEX) 20 MG tablet Take 1 tablet (20 mg total) by mouth 2 (two) times daily. 60 tablet 3   No current facility-administered medications for this encounter.    PHYSICAL EXAM: Vitals:   10/11/16 1402  BP: 110/74  Pulse: 74  SpO2: 96%  Weight: 124 lb 9.6 oz (56.5  kg)    Wt Readings from Last 3 Encounters:  10/11/16 124 lb 9.6 oz (56.5 kg)  10/01/16 117 lb 3.2 oz (53.2 kg)  09/27/16 121 lb 8 oz (55.1 kg)    General: Well appearing. No resp difficulty. HEENT: Normal Neck: Supple. JVP 5-6. Carotids 2+ bilat; no bruits. No thyromegaly or nodule noted. Cor: PMI nondisplaced. RRR, Mild TR murmur. M/G/R noted. PPM L upper chest.  Lungs: CTAB,  normal effort. Abdomen: Soft, non-tender, non-distended, no HSM. No bruits or masses. +BS  Extremities: No cyanosis, clubbing, rash, R and LLE no edema.  Neuro: Alert & orientedx3, cranial nerves grossly intact. moves all 4 extremities w/o difficulty. Affect pleasant   General: Female, NAD. No resp difficulty. HEENT: Normal Neck: Supple. JVP 7-8 with mild HJR. Carotids 2+ bilat; no bruits. No thyromegaly or nodule noted. Cor: PMI nondisplaced. RRR, No M/G/R noted. PPM L upper chest. Mild TR murmur. Lungs: CTAB, normal effort. Abdomen: Soft, non-tender, non-distended, no HSM. No bruits or masses. +BS  Extremities: No cyanosis, clubbing, or rash. Trace ankle edema.  Neuro: Alert & orientedx3, cranial nerves grossly intact. moves all 4 extremities w/o difficulty. Affect pleasant   ASSESSMENT & PLAN: 1. Chronic systolic CHF: EF 32% s/p CRT-P upgrade 05/2012 (due to chronic RV pacing)  - Echo 08/22/16 At [facility]: LVEF 40-45%, mild MR, Mild TR - NYHA class II symptoms - Volume status stable on exam.  - Continue torsemide 20 mg BID. BMET today.  - Continue Coreg 12.5mg  BID.  No room to up-titrate with soft pressures. Have encouraged her to get a new BP cuff for home, or compare to another source.  - Continue spiro 12.5mg  daily.  - Continue losartan 25 mg daily.  - Reinforced fluid restriction to < 2 L daily, sodium restriction to less than 2000 mg daily, and the importance of daily weights.   2. H/o HCM  3. HTN - Stable on current meds. Low at home. Negative orthostatics in clinic today (BP from 110 sitting ->  112 standing)  4. Tobacco use quit 2012 - No further smoking.  5. Atypical chest pain - Last week in ED. Negative work up.  - None since, but associated with Nausea and upper abdominal pain. Suspect GI etiology - Asked pt to follow up with any repeat chest pain.   She is doing well overall. Continue current medication. Labs today. RTC 4-6 weeks.    Shirley Friar, PA-C  10/11/2016   Patient seen and examined with the above-signed Advanced Practice Provider and/or Housestaff. I personally reviewed laboratory data, imaging studies and relevant notes. I independently examined the patient and formulated the important aspects of the plan. I have edited the note to reflect any of my changes or salient points. I have personally discussed the plan with the patient and/or family.  Volume status much improved on torsemide. Will continue. Again discussed possibility of Cardiomems Sensor. If she struggles with torsemide will place. CP does not appear anginal.   Glori Bickers, MD  11:48 PM

## 2016-10-11 NOTE — Patient Instructions (Signed)
Labs today We will only contact you if something comes back abnormal or we need to make some changes. Otherwise no news is good news!    Your physician recommends that you schedule a follow-up appointment in: 4-6 weeks with Dr Haroldine Laws  Do the following things EVERYDAY: 1) Weigh yourself in the morning before breakfast. Write it down and keep it in a log. 2) Take your medicines as prescribed 3) Eat low salt foods-Limit salt (sodium) to 2000 mg per day.  4) Stay as active as you can everyday 5) Limit all fluids for the day to less than 2 liters

## 2016-10-15 ENCOUNTER — Ambulatory Visit (INDEPENDENT_AMBULATORY_CARE_PROVIDER_SITE_OTHER): Payer: 59 | Admitting: *Deleted

## 2016-10-15 DIAGNOSIS — R001 Bradycardia, unspecified: Secondary | ICD-10-CM

## 2016-10-16 DIAGNOSIS — N3000 Acute cystitis without hematuria: Secondary | ICD-10-CM | POA: Diagnosis not present

## 2016-10-16 DIAGNOSIS — F17200 Nicotine dependence, unspecified, uncomplicated: Secondary | ICD-10-CM | POA: Diagnosis not present

## 2016-10-16 DIAGNOSIS — R0602 Shortness of breath: Secondary | ICD-10-CM | POA: Diagnosis not present

## 2016-10-16 DIAGNOSIS — F411 Generalized anxiety disorder: Secondary | ICD-10-CM | POA: Diagnosis not present

## 2016-10-16 DIAGNOSIS — N390 Urinary tract infection, site not specified: Secondary | ICD-10-CM | POA: Diagnosis not present

## 2016-10-16 DIAGNOSIS — N182 Chronic kidney disease, stage 2 (mild): Secondary | ICD-10-CM | POA: Insufficient documentation

## 2016-10-16 DIAGNOSIS — Z950 Presence of cardiac pacemaker: Secondary | ICD-10-CM | POA: Diagnosis not present

## 2016-10-16 DIAGNOSIS — Z87891 Personal history of nicotine dependence: Secondary | ICD-10-CM | POA: Diagnosis not present

## 2016-10-16 DIAGNOSIS — J449 Chronic obstructive pulmonary disease, unspecified: Secondary | ICD-10-CM | POA: Diagnosis not present

## 2016-10-16 DIAGNOSIS — R748 Abnormal levels of other serum enzymes: Secondary | ICD-10-CM | POA: Diagnosis not present

## 2016-10-16 DIAGNOSIS — I5043 Acute on chronic combined systolic (congestive) and diastolic (congestive) heart failure: Secondary | ICD-10-CM | POA: Diagnosis not present

## 2016-10-16 DIAGNOSIS — I130 Hypertensive heart and chronic kidney disease with heart failure and stage 1 through stage 4 chronic kidney disease, or unspecified chronic kidney disease: Secondary | ICD-10-CM | POA: Diagnosis not present

## 2016-10-16 DIAGNOSIS — R7989 Other specified abnormal findings of blood chemistry: Secondary | ICD-10-CM | POA: Diagnosis not present

## 2016-10-16 DIAGNOSIS — R0902 Hypoxemia: Secondary | ICD-10-CM | POA: Diagnosis not present

## 2016-10-16 DIAGNOSIS — I421 Obstructive hypertrophic cardiomyopathy: Secondary | ICD-10-CM | POA: Diagnosis not present

## 2016-10-16 NOTE — Progress Notes (Signed)
Remote pacemaker transmission.   

## 2016-10-17 DIAGNOSIS — I493 Ventricular premature depolarization: Secondary | ICD-10-CM | POA: Diagnosis not present

## 2016-10-17 DIAGNOSIS — J449 Chronic obstructive pulmonary disease, unspecified: Secondary | ICD-10-CM | POA: Diagnosis not present

## 2016-10-17 DIAGNOSIS — N3000 Acute cystitis without hematuria: Secondary | ICD-10-CM | POA: Diagnosis not present

## 2016-10-17 DIAGNOSIS — F17200 Nicotine dependence, unspecified, uncomplicated: Secondary | ICD-10-CM | POA: Diagnosis not present

## 2016-10-17 DIAGNOSIS — I421 Obstructive hypertrophic cardiomyopathy: Secondary | ICD-10-CM | POA: Diagnosis not present

## 2016-10-17 DIAGNOSIS — R748 Abnormal levels of other serum enzymes: Secondary | ICD-10-CM | POA: Diagnosis not present

## 2016-10-17 DIAGNOSIS — I130 Hypertensive heart and chronic kidney disease with heart failure and stage 1 through stage 4 chronic kidney disease, or unspecified chronic kidney disease: Secondary | ICD-10-CM | POA: Diagnosis not present

## 2016-10-17 DIAGNOSIS — I5043 Acute on chronic combined systolic (congestive) and diastolic (congestive) heart failure: Secondary | ICD-10-CM | POA: Diagnosis not present

## 2016-10-17 DIAGNOSIS — N182 Chronic kidney disease, stage 2 (mild): Secondary | ICD-10-CM | POA: Diagnosis not present

## 2016-10-17 DIAGNOSIS — F411 Generalized anxiety disorder: Secondary | ICD-10-CM | POA: Diagnosis not present

## 2016-10-17 DIAGNOSIS — R7989 Other specified abnormal findings of blood chemistry: Secondary | ICD-10-CM | POA: Diagnosis not present

## 2016-10-17 DIAGNOSIS — Z87891 Personal history of nicotine dependence: Secondary | ICD-10-CM | POA: Diagnosis not present

## 2016-10-19 ENCOUNTER — Encounter: Payer: Self-pay | Admitting: Cardiology

## 2016-10-24 MED FILL — CARVEDILOL 12.5 MG TABLET: 12.5 | 30 days supply | Qty: 60 | Fill #2

## 2016-10-24 MED FILL — ALPRAZolam 0.25 MG TABS: 0.25 | 15 days supply | Qty: 30 | Fill #2

## 2016-10-24 MED FILL — SPIRONOLACTONE 25 MG TABLET: 25 | 30 days supply | Qty: 15 | Fill #2

## 2016-10-24 MED FILL — TORSEMIDE 20 MG TABLET: 20 | 30 days supply | Qty: 60 | Fill #1

## 2016-11-05 LAB — CUP PACEART REMOTE DEVICE CHECK
Battery Remaining Longevity: 66 mo
Battery Voltage: 2.93 V
Brady Statistic AP VP Percent: 52 %
Brady Statistic AS VS Percent: 1 %
Brady Statistic RA Percent Paced: 50 %
Date Time Interrogation Session: 20181008074152
Implantable Lead Implant Date: 19940705
Implantable Lead Implant Date: 20140501
Implantable Lead Location: 753858
Implantable Lead Location: 753860
Implantable Lead Model: 4024
Implantable Pulse Generator Implant Date: 20140501
Lead Channel Impedance Value: 360 Ohm
Lead Channel Impedance Value: 480 Ohm
Lead Channel Impedance Value: 580 Ohm
Lead Channel Pacing Threshold Amplitude: 1 V
Lead Channel Pacing Threshold Pulse Width: 0.4 ms
Lead Channel Setting Pacing Amplitude: 2 V
Lead Channel Setting Pacing Amplitude: 2.5 V
Lead Channel Setting Pacing Pulse Width: 1 ms
MDC IDC LEAD IMPLANT DT: 19940705
MDC IDC LEAD LOCATION: 753859
MDC IDC MSMT BATTERY REMAINING PERCENTAGE: 81 %
MDC IDC MSMT LEADCHNL LV PACING THRESHOLD AMPLITUDE: 0.75 V
MDC IDC MSMT LEADCHNL LV PACING THRESHOLD PULSEWIDTH: 1 ms
MDC IDC MSMT LEADCHNL RA SENSING INTR AMPL: 2 mV
MDC IDC MSMT LEADCHNL RV PACING THRESHOLD AMPLITUDE: 1.25 V
MDC IDC MSMT LEADCHNL RV PACING THRESHOLD PULSEWIDTH: 0.4 ms
MDC IDC MSMT LEADCHNL RV SENSING INTR AMPL: 5.4 mV
MDC IDC SET LEADCHNL RV PACING AMPLITUDE: 2.25 V
MDC IDC SET LEADCHNL RV PACING PULSEWIDTH: 0.4 ms
MDC IDC SET LEADCHNL RV SENSING SENSITIVITY: 2 mV
MDC IDC STAT BRADY AP VS PERCENT: 1 %
MDC IDC STAT BRADY AS VP PERCENT: 48 %
Pulse Gen Model: 3210
Pulse Gen Serial Number: 2936617

## 2016-11-20 MED FILL — CARVEDILOL 12.5 MG TABLET: 12.5 | 30 days supply | Qty: 60 | Fill #3

## 2016-11-26 MED FILL — ALPRAZolam 0.25 MG TABS: 0.25 | 15 days supply | Qty: 30 | Fill #3

## 2016-11-26 MED FILL — TORSEMIDE 20 MG TABLET: 20 | 30 days supply | Qty: 60 | Fill #2

## 2016-11-26 MED FILL — SPIRONOLACTONE 25 MG TABLET: 25 | 30 days supply | Qty: 15 | Fill #3

## 2016-11-26 MED FILL — SYMBICORT 160-4.5 MCG INH: 160-4.5 | 30 days supply | Qty: 10 | Fill #3

## 2016-11-28 ENCOUNTER — Other Ambulatory Visit: Payer: Self-pay

## 2016-11-28 ENCOUNTER — Ambulatory Visit (HOSPITAL_COMMUNITY)
Admission: RE | Admit: 2016-11-28 | Discharge: 2016-11-28 | Disposition: A | Discharge status: Home / Self Care | Payer: 59 | Source: Ambulatory Visit | Attending: Internal Medicine | Admitting: Internal Medicine

## 2016-11-28 VITALS — BP 110/68 | HR 65 | Wt 125.8 lb

## 2016-11-28 DIAGNOSIS — E876 Hypokalemia: Secondary | ICD-10-CM | POA: Insufficient documentation

## 2016-11-28 DIAGNOSIS — I110 Hypertensive heart disease with heart failure: Secondary | ICD-10-CM | POA: Diagnosis not present

## 2016-11-28 DIAGNOSIS — I421 Obstructive hypertrophic cardiomyopathy: Secondary | ICD-10-CM | POA: Diagnosis not present

## 2016-11-28 DIAGNOSIS — R001 Bradycardia, unspecified: Secondary | ICD-10-CM | POA: Diagnosis not present

## 2016-11-28 DIAGNOSIS — Z7901 Long term (current) use of anticoagulants: Secondary | ICD-10-CM | POA: Insufficient documentation

## 2016-11-28 DIAGNOSIS — I5032 Chronic diastolic (congestive) heart failure: Secondary | ICD-10-CM

## 2016-11-28 DIAGNOSIS — Z87891 Personal history of nicotine dependence: Secondary | ICD-10-CM | POA: Diagnosis not present

## 2016-11-28 DIAGNOSIS — I5022 Chronic systolic (congestive) heart failure: Secondary | ICD-10-CM | POA: Insufficient documentation

## 2016-11-28 DIAGNOSIS — I959 Hypotension, unspecified: Secondary | ICD-10-CM | POA: Insufficient documentation

## 2016-11-28 DIAGNOSIS — R42 Dizziness and giddiness: Secondary | ICD-10-CM | POA: Insufficient documentation

## 2016-11-28 DIAGNOSIS — I480 Paroxysmal atrial fibrillation: Secondary | ICD-10-CM | POA: Diagnosis not present

## 2016-11-28 DIAGNOSIS — Z79899 Other long term (current) drug therapy: Secondary | ICD-10-CM | POA: Insufficient documentation

## 2016-11-28 DIAGNOSIS — G44009 Cluster headache syndrome, unspecified, not intractable: Secondary | ICD-10-CM | POA: Diagnosis not present

## 2016-11-28 LAB — BASIC METABOLIC PANEL
ANION GAP: 10 (ref 5–15)
BUN: 27 mg/dL — ABNORMAL HIGH (ref 6–20)
CHLORIDE: 101 mmol/L (ref 101–111)
CO2: 27 mmol/L (ref 22–32)
Calcium: 9.3 mg/dL (ref 8.9–10.3)
Creatinine, Ser: 1.37 mg/dL — ABNORMAL HIGH (ref 0.44–1.00)
GFR calc non Af Amer: 40 mL/min — ABNORMAL LOW (ref >60)
GFR, EST AFRICAN AMERICAN: 46 mL/min — AB (ref >60)
Glucose, Bld: 89 mg/dL (ref 65–99)
POTASSIUM: 4.5 mmol/L (ref 3.5–5.1)
Sodium: 138 mmol/L (ref 135–145)

## 2016-11-28 MED ORDER — APIXABAN 5 MG PO TABS: 5.0000 mg | ORAL_TABLET | Freq: Two times a day (BID) | ORAL | 3 refills | Status: DC

## 2016-11-28 MED FILL — ELIQUIS 5 MG TABLET: 5 | 30 days supply | Qty: 60 | Fill #0 | Status: TO

## 2016-11-28 NOTE — Patient Instructions (Addendum)
Start Eliquis 5 mg (1 tab), twice a day  Restart potassium 20 meq (1 tab) daily  Labs drawn today (if we do not call you, then your lab work was stable)   Your physician recommends that you schedule a follow-up appointment in: 4 months with Dr. Haroldine Laws

## 2016-11-28 NOTE — Progress Notes (Signed)
Advanced Heart Failure [facility] Note   Patient ID: Angela Shepherd, female   DOB: March 26, 1950, 65 y.o.   MRN: [id number] PCP: Dr. Birdie Riddle  HPI: Angela Shepherd is a 66 y.o. 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 PMHx also notable for HTN and prior tobacco abuse (quit 02/2010).   Was admitted in December 2011 with acute HF. Echo with EF 30-35%, There was also evidence of ASH with septum of 1.7 PW = 1.9. Diuresed and scheduled for outpt cath.  Cath 12/2009: Normal coronaries. EF 40-45%.  Central aortic pressure was 86/47 with a mean of 62.  LV pressure is 74/13 with an EDP of 2.  Right atrial pressure mean of 3.  RV pressure 26/3 with an EDP of 5.  PA pressure 28/5 with a mean of 16.  Pulmonary capillary wedge pressure mean of 7.  Fick cardiac output was 3.4 and cardiac index 2.1.   Echo in March 2012:  EF 50-55%. Grade 2 diastolic dysfunction. IVS 1.4cm.  Echo 4/14: EF 40-45%  ECHO 09/03/13: EF 40-45% Echo 4/16: EF 45-50%  Was seen by EP and noted to have a high percentage of RV pacing (99%) and it was felt this was worsening LV function. Underwent upgrade to [facility]. Jude CRT-P in 5/14.  Echo in 4/16 showed EF 45-50% with mild LVH.   Pt admitted to [facility] 08/2016 for CHF. Required BiPAP on admission but quickly weaned off O2. Pt denied medical or dietary non-compliance. Was told about Cardiomems device which she would like to pursue if she is a candidate.   Admitted to [facility](09/20/16) and diuresed 8 lbs. No changes were made to her chronic medications.   She presents today for routine f/u. Feeling much better. Fluid well controlled with torsemide. Felt bad on Sept 22 and device interrogation showed 31 hour episode of AF. Was listed in MyChart by Angela Shepherd but she was not contacted. Has not had episode since. Denies orthopnea, PNS or edema. Has not been taking potassium. Walking 1 mile on TM every am.    Echo  08/22/16 At [facility]: LVEF 40-45%, mild MR, Mild TR  SH: Nonsmoker, works at [employer], lives in [place].   Review of systems complete and found to be negative unless listed in HPI.    Past Medical History:  Diagnosis Date  . Bradycardia    a. initial PPM placed 1994 at [facility] with gen change 2002. b. 1*AVB & intermittent 2nd degree AVB + CHF --> upgrade to [facility]. Jude BiV PPM 05/08/12.  . Chronic systolic CHF (congestive heart failure) ([place] [facility])    a. EF 30-35% dx in 2011 -> most recent 40-45% by echo 04/2012. b. s/p upgrade to BiV-PPM 05/08/12. c. Med titration limited by hypotension.  . Cluster headache syndrome   . Hypertr obst cardiomyop   . Hypokalemia   . Hypotension   . Paroxysmal atrial fibrillation ([facility])    a. identified on PPM interrogation b. longest episode 1 hour; if burden increases, will need [place]   . Pneumonia 2008    Current Outpatient Medications  Medication Sig Dispense Refill  . albuterol (PROVENTIL HFA;VENTOLIN HFA) 108 (90 Base) MCG/ACT inhaler Inhale 1-2 puffs into the lungs every 6 (six) hours as needed for wheezing or shortness of breath. 1 Inhaler 1  . ALPRAZolam (XANAX) 0.25 MG tablet Take 1 tablet (0.25 mg total) by mouth 2 (two) times daily as needed  for anxiety. 30 tablet 3  . AMBULATORY NON FORMULARY MEDICATION Medication Name: 100% O2 15 leters a min for 15-20 mins  Via Nasal cannual 1 Bottle 2  . carvedilol (COREG) 12.5 MG tablet TAKE 1 TABLET (12.5 MG TOTAL) BY MOUTH 2 (TWO) TIMES DAILY WITH A MEAL. 60 tablet 3  . cyclobenzaprine (FLEXERIL) 5 MG tablet TAKE 1 TABLET (5 MG TOTAL) BY MOUTH 3 (THREE) TIMES DAILY AS NEEDED FOR MUSCLE SPASMS. 30 tablet 2  . diclofenac sodium (VOLTAREN) 1 % GEL Apply 2 g topically 4 (four) times daily. 3 Tube 0  . FLUoxetine (PROZAC) 20 MG capsule Take 1 capsule (20 mg total) by mouth daily. 30 capsule 3  . fluticasone (FLONASE) 50 MCG/ACT nasal spray PLACE 2 SPRAYS INTO BOTH NOSTRILS DAILY. 16 g 5  . ipratropium-albuterol (DUONEB)  0.5-2.5 (3) MG/3ML SOLN USE 1 VIAL (3ML) BY NEBULIZER EVERY 4 TO 6 HOURS AS NEEDED 90 mL 0  . loratadine (CLARITIN) 10 MG tablet Take 1 tablet (10 mg total) by mouth daily. 30 tablet 5  . losartan (COZAAR) 25 MG tablet Take 1 tablet (25 mg total) by mouth daily. 30 tablet 3  . nicotine (NICODERM CQ - DOSED IN MG/24 HOURS) 14 mg/24hr patch Place 1 patch (14 mg total) onto the skin daily. 28 patch 0  . PULMICORT FLEXHALER 180 MCG/ACT inhaler   1  . Spacer/Aero-Holding Chambers (AEROCHAMBER MV) inhaler Use as instructed with Symbicort 1 each 0  . spironolactone (ALDACTONE) 25 MG tablet TAKE 1/2 TABLET BY MOUTH DAILY 15 tablet 6  . SUMAtriptan (IMITREX) 50 MG tablet TAKE 1 TABLET AT ONSET OF HEADACHE. MAY REPEAT IN 2 HOURS IF HEADACHE PERSISTS OR RECURS. 10 tablet 6  . SYMBICORT 160-4.5 MCG/ACT inhaler INHALE 2 PUFFS BY MOUTH INTO THE LUNGS 2 TIMES A DAY (Patient taking differently: INHALE 2 PUFFS BY MOUTH INTO THE LUNGS 2 TIMES A DAY as needed) 10.2 g 3  . topiramate (TOPAMAX) 50 MG tablet Take 50 mg by mouth daily.    Marland Kitchen torsemide (DEMADEX) 20 MG tablet Take 1 tablet (20 mg total) by mouth 2 (two) times daily. 60 tablet 3  . aspirin 81 MG chewable tablet Chew 81 mg by mouth daily.    . potassium chloride SA (K-DUR,KLOR-CON) 20 MEQ tablet Take 1 tablet (20 mEq total) by mouth daily. (Patient not taking: Reported on 11/28/2016) 90 tablet 3   No current facility-administered medications for this encounter.    PHYSICAL EXAM: Vitals:   11/28/16 1121  Weight: 125 lb 12.8 oz (57.1 kg)   Vitals:   11/28/16 1121  BP: 110/68  Pulse: 65  SpO2: 98%     Wt Readings from Last 3 Encounters:  11/28/16 125 lb 12.8 oz (57.1 kg)  10/11/16 124 lb 9.6 oz (56.5 kg)  10/01/16 117 lb 3.2 oz (53.2 kg)    General:  Well appearing. No resp difficulty HEENT: normal Neck: supple. no JVD. Carotids 2+ bilat; no bruits. No lymphadenopathy or thryomegaly appreciated. Cor: PMI nondisplaced. Regular rate & rhythm. No  rubs, gallops or murmurs. Lungs: clear with decreased BS Abdomen: soft, nontender, nondistended. No hepatosplenomegaly. No bruits or masses. Good bowel sounds. Extremities: no cyanosis, clubbing, rash, edema Neuro: alert & orientedx3, cranial nerves grossly intact. moves all 4 extremities w/o difficulty. Affect pleasant  ASSESSMENT & PLAN: 1. Chronic systolic CHF: EF 16% s/p CRT-P upgrade 05/2012 (due to chronic RV pacing)  - Echo 08/22/16 At [facility]: LVEF 40-45%, mild MR, Mild TR - Doing  well. NYHA class II symptoms  - Volume status improved with switch to torsemide - Continue torsemide 20 mg BID. BMET today. I asked her to resume her KCL.  - Continue Coreg 12.5mg  BID.   - Continue spiro 12.5mg  daily.  - Continue losartan 25 mg daily. Have not titrated due to dizziness.  - Reinforced fluid restriction to < 2 L daily, sodium restriction to less than 2000 mg daily, and the importance of daily weights.   - Volume status much improved on torsemide. Will continue. Again discussed possibility of Cardiomems Sensor. If she struggles with torsemide will place. CP does not appear anginal.  2. H/o HCM  3. HTN - Stable on current meds. Remains low at home  4. Tobacco use quit 2012 - No further smoking.  5. PAF - new onset. Detected by device interrogation -This patients CHA2DS2-VASc Score and unadjusted Ischemic Stroke Rate (% per year) is equal to 4.8 % stroke rate/year from a score of 4 - Start Eliquis 5 bid    Glori Bickers, MD  11/28/2016

## 2016-12-13 ENCOUNTER — Other Ambulatory Visit: Payer: Self-pay | Admitting: General Practice

## 2016-12-13 MED ORDER — ALPRAZOLAM 0.25 MG PO TABS: 0.2500 mg | ORAL_TABLET | Freq: Two times a day (BID) | ORAL | 3 refills | Status: DC | PRN

## 2016-12-13 MED FILL — ALPRAZolam 0.25 MG TABS: 0.25 | 15 days supply | Qty: 30 | Fill #0

## 2016-12-13 NOTE — Telephone Encounter (Signed)
Last OV 09/07/16 Alprazolam last filled 08/24/16 #30 with 3

## 2016-12-13 NOTE — Telephone Encounter (Signed)
Medication filled to pharmacy as requested.   

## 2016-12-14 MED FILL — FUROSEMIDE 80 MG TAB: 80 | 22 days supply | Qty: 44 | Fill #1

## 2016-12-17 ENCOUNTER — Other Ambulatory Visit (HOSPITAL_COMMUNITY): Payer: Self-pay | Admitting: Internal Medicine

## 2016-12-19 MED FILL — CARVEDILOL 12.5 MG TABLET: 12.5 | 30 days supply | Qty: 60 | Fill #0

## 2016-12-25 DIAGNOSIS — H524 Presbyopia: Secondary | ICD-10-CM | POA: Diagnosis not present

## 2016-12-25 DIAGNOSIS — H04123 Dry eye syndrome of bilateral lacrimal glands: Secondary | ICD-10-CM | POA: Diagnosis not present

## 2016-12-25 DIAGNOSIS — H2513 Age-related nuclear cataract, bilateral: Secondary | ICD-10-CM | POA: Diagnosis not present

## 2016-12-25 MED FILL — TORSEMIDE 20 MG TABLET: 20 | 30 days supply | Qty: 60 | Fill #3

## 2016-12-25 MED FILL — SPIRONOLACTONE 25 MG TABS: 25 | 30 days supply | Qty: 15 | Fill #4

## 2017-01-07 ENCOUNTER — Encounter: Payer: Self-pay | Admitting: Family Medicine

## 2017-01-07 ENCOUNTER — Other Ambulatory Visit: Payer: 59

## 2017-01-07 ENCOUNTER — Ambulatory Visit (HOSPITAL_BASED_OUTPATIENT_CLINIC_OR_DEPARTMENT_OTHER)
Admission: RE | Admit: 2017-01-07 | Discharge: 2017-01-07 | Disposition: A | Discharge status: Home / Self Care | Payer: 59 | Source: Ambulatory Visit | Attending: Family Medicine | Admitting: Family Medicine

## 2017-01-07 ENCOUNTER — Ambulatory Visit (INDEPENDENT_AMBULATORY_CARE_PROVIDER_SITE_OTHER): Payer: 59 | Admitting: Family Medicine

## 2017-01-07 VITALS — BP 109/70 | HR 65 | Ht 62.0 in | Wt 120.0 lb

## 2017-01-07 DIAGNOSIS — M25551 Pain in right hip: Secondary | ICD-10-CM | POA: Insufficient documentation

## 2017-01-07 NOTE — Progress Notes (Addendum)
PCP: Midge Minium, MD Consultation requested by: Debbrah Alar NP  Subjective:   HPI: Patient is a 66 y.o. female here for right hip pain.  6/14: She has had right hip pain since December. Feels like there's a rod running down posterior hip through leg. Pain worse with prolonged sitting, walking, at nighttime. Pain is posterior and deep. No numbness, tingling. No skin changes. Pain level up to 5/10, sharp. No back pain.  12/31: Patient continues to have pain in right hip that is deep and lateral. Worse at nighttime up to 8/10, keeping her awake, sharp. No new injury or trauma. She did recall that when she gets her injection for osteoporosis her pain gets worse in the same area (has had injection twice, most recently in august). Tried ice/heat without benefit. No skin changes, numbness. No back pain. No bowel/bladder dysfunction.  Past Medical History:  Diagnosis Date  . Bradycardia    a. initial PPM placed 1994 at [facility] with gen change 2002. b. 1*AVB & intermittent 2nd degree AVB + CHF --> upgrade to [facility]. Jude BiV PPM 05/08/12.  . Chronic systolic CHF (congestive heart failure) ([place])    a. EF 30-35% dx in 2011 -> most recent 40-45% by echo 04/2012. b. s/p upgrade to BiV-PPM 05/08/12. c. Med titration limited by hypotension.  . Cluster headache syndrome   . Hypertr obst cardiomyop   . Hypokalemia   . Hypotension   . Paroxysmal atrial fibrillation (HCC)    a. identified on PPM interrogation b. longest episode 1 hour; if burden increases, will need [facility]   . Pneumonia 2008    Current Outpatient Medications on File Prior to Visit  Medication Sig Dispense Refill  . albuterol (PROVENTIL HFA;VENTOLIN HFA) 108 (90 Base) MCG/ACT inhaler Inhale 1-2 puffs into the lungs every 6 (six) hours as needed for wheezing or shortness of breath. 1 Inhaler 1  . ALPRAZolam (XANAX) 0.25 MG tablet Take 1 tablet (0.25 mg total) by mouth 2 (two) times daily as needed for anxiety. 30 tablet 3   . AMBULATORY NON FORMULARY MEDICATION Medication Name: 100% O2 15 leters a min for 15-20 mins  Via Nasal cannual 1 Bottle 2  . amitriptyline (ELAVIL) 10 MG tablet Take 10 mg by mouth.    Marland Kitchen apixaban (ELIQUIS) 5 MG TABS tablet Take 1 tablet (5 mg total) by mouth 2 (two) times daily. 60 tablet 3  . aspirin 81 MG chewable tablet Chew 81 mg by mouth daily.    . carvedilol (COREG) 12.5 MG tablet TAKE 1 TABLET (12.5 MG TOTAL) BY MOUTH 2 (TWO) TIMES DAILY WITH A MEAL. 60 tablet 3  . cyclobenzaprine (FLEXERIL) 5 MG tablet TAKE 1 TABLET (5 MG TOTAL) BY MOUTH 3 (THREE) TIMES DAILY AS NEEDED FOR MUSCLE SPASMS. 30 tablet 2  . diclofenac sodium (VOLTAREN) 1 % GEL Apply 2 g topically 4 (four) times daily. 3 Tube 0  . FLUoxetine (PROZAC) 20 MG capsule Take 1 capsule (20 mg total) by mouth daily. 30 capsule 3  . fluticasone (FLONASE) 50 MCG/ACT nasal spray PLACE 2 SPRAYS INTO BOTH NOSTRILS DAILY. 16 g 5  . furosemide (LASIX) 80 MG tablet   3  . ipratropium-albuterol (DUONEB) 0.5-2.5 (3) MG/3ML SOLN USE 1 VIAL (3ML) BY NEBULIZER EVERY 4 TO 6 HOURS AS NEEDED 90 mL 0  . loratadine (CLARITIN) 10 MG tablet Take 1 tablet (10 mg total) by mouth daily. 30 tablet 5  . losartan (COZAAR) 25 MG tablet Take 1 tablet (25 mg total)  by mouth daily. 30 tablet 3  . nicotine (NICODERM CQ - DOSED IN MG/24 HOURS) 14 mg/24hr patch Place 1 patch (14 mg total) onto the skin daily. 28 patch 0  . potassium chloride SA (K-DUR,KLOR-CON) 20 MEQ tablet Take 1 tablet (20 mEq total) by mouth daily. (Patient not taking: Reported on 11/28/2016) 90 tablet 3  . PULMICORT FLEXHALER 180 MCG/ACT inhaler   1  . Spacer/Aero-Holding Chambers (AEROCHAMBER MV) inhaler Use as instructed with Symbicort 1 each 0  . spironolactone (ALDACTONE) 25 MG tablet TAKE 1/2 TABLET BY MOUTH DAILY 15 tablet 6  . SUMAtriptan (IMITREX) 50 MG tablet TAKE 1 TABLET AT ONSET OF HEADACHE. MAY REPEAT IN 2 HOURS IF HEADACHE PERSISTS OR RECURS. 10 tablet 6  . SYMBICORT 160-4.5  MCG/ACT inhaler INHALE 2 PUFFS BY MOUTH INTO THE LUNGS 2 TIMES A DAY (Patient taking differently: INHALE 2 PUFFS BY MOUTH INTO THE LUNGS 2 TIMES A DAY as needed) 10.2 g 3  . topiramate (TOPAMAX) 50 MG tablet Take 50 mg by mouth daily.    Marland Kitchen torsemide (DEMADEX) 20 MG tablet Take 1 tablet (20 mg total) by mouth 2 (two) times daily. 60 tablet 3   No current facility-administered medications on file prior to visit.     Past Surgical History:  Procedure Laterality Date  . BI-VENTRICULAR PACEMAKER UPGRADE N/A 05/08/2012   upgrade to SJM Anthem (CRT-P) by Dr Rayann Heman  . PACEMAKER INSERTION  1994; 2002; 05/08/2012  . TONSILLECTOMY  ~ 1959  . TUBAL LIGATION  1984?    Allergies  Allergen Reactions  . Prednisone Other (See Comments)    SOB, CHF     Social History   Socioeconomic History  . Marital status: Married    Spouse name: Not on file  . Number of children: 2  . Years of education: Not on file  . Highest education level: Not on file  Social Needs  . Financial resource strain: Not on file  . Food insecurity - worry: Not on file  . Food insecurity - inability: Not on file  . Transportation needs - medical: Not on file  . Transportation needs - non-medical: Not on file  Occupational History  . Occupation: [employer]    Employer: [employer]  Tobacco Use  . Smoking status: Former Smoker    Packs/day: 3.00  . Smokeless tobacco: Never Used  Substance and Sexual Activity  . Alcohol use: Yes    Alcohol/week: 1.2 oz    Types: 2 Glasses of wine per week  . Drug use: No  . Sexual activity: No  Other Topics Concern  . Not on file  Social History Narrative   ** Merged History Encounter **        Family History  Problem Relation Age of Onset  . Heart disease Sister   . Dementia Mother   . Cancer Sister 70       uterine  . Diabetes Unknown   . Cardiomyopathy Sister   . Diabetes Brother   . Colon cancer Neg Hx   . Esophageal cancer Neg Hx   . Stomach cancer Neg  Hx   . Rectal cancer Neg Hx     BP 109/70   Pulse 65   Ht 5\' 2"  (1.575 m)   Wt 120 lb (54.4 kg)   LMP  (LMP Unknown)   BMI 21.95 kg/m   Review of Systems: See HPI above.     Objective:  Physical Exam:  Gen: NAD, comfortable  in exam room.  Back: No gross deformity, scoliosis. No TTP .  No midline or bony TTP. FROM. Strength LEs 5/5 all muscle groups.   2+ MSRs in patellar and achilles tendons, equal bilaterally. Negative SLRs. Sensation intact to light touch bilaterally.  Right hip: No deformity. No TTP. FROM with 5/5 strength. Negative logroll bilateral hips Negative fabers and piriformis stretches. NVI distally.  Assessment & Plan:  1. Right hip pain - not improving compared to last visit 6 months ago.  Independently reviewed radiographs and no evidence arthritis, bony lesion, collapse of femoral head to explain her symptoms.  Given length of symptoms without improvement with home exercises, voltaren gel, advised we go ahead with advanced imaging.  Initially were going to do MRI but she has a pacemaker - will have to go ahead with CT arthrogram to assess for AVN, labral tear, rule out bony lesion.  Icing, voltaren gel, tylenol in meantime if needed.  Addendum:  Ct arthrogram reviewed and discussed with patient - no evidence abnormalities to account for her pain.  She did, however note the lidocaine resolved her pain.  ? If some synovitis in the joint.  We also discussed possibility of referred pain from the back or intraabdominal source.  Discussed f/u with PCP for the latter, CT myelogram of the back, trial of intraarticular cortisone injection.  Will try the cortisone injection as next step.

## 2017-01-07 NOTE — Patient Instructions (Signed)
We will go ahead with an MRI of this hip to assess for avascular necrosis, worse arthritis than is noted on x-rays. Your exam is otherwise reassuring. I will contact you with results and next steps.

## 2017-01-07 NOTE — Assessment & Plan Note (Signed)
not improving compared to last visit 6 months ago.  Independently reviewed radiographs and no evidence arthritis, bony lesion, collapse of femoral head to explain her symptoms.  Given length of symptoms without improvement with home exercises, voltaren gel, advised we go ahead with advanced imaging.  Initially were going to do MRI but she has a pacemaker - will have to go ahead with CT arthrogram to assess for AVN, labral tear, rule out bony lesion.  Icing, voltaren gel, tylenol in meantime if needed.

## 2017-01-10 MED FILL — FLUTICASONE PROP 50 MCG SPR: 50 | 30 days supply | Qty: 16 | Fill #1

## 2017-01-10 MED FILL — LOSARTAN POTASSIUM 25 MG TA: 25 | 30 days supply | Qty: 30 | Fill #1

## 2017-01-11 MED FILL — ELIQUIS 5 MG TABLET: 5 | 30 days supply | Qty: 60 | Fill #0

## 2017-01-14 ENCOUNTER — Ambulatory Visit (INDEPENDENT_AMBULATORY_CARE_PROVIDER_SITE_OTHER): Payer: 59 | Admitting: *Deleted

## 2017-01-14 ENCOUNTER — Telehealth (HOSPITAL_COMMUNITY): Payer: Self-pay | Admitting: *Deleted

## 2017-01-14 DIAGNOSIS — R001 Bradycardia, unspecified: Secondary | ICD-10-CM

## 2017-01-14 NOTE — Telephone Encounter (Signed)
Received fax from [place], pt needs CT arthrogram of right hip and needs to be off Eliquis for 48 hours prior.  Per Dr Haroldine Laws "ok to hold eliquis"  Form faxed back to them at [phone]

## 2017-01-14 NOTE — Progress Notes (Signed)
Remote pacemaker transmission.   

## 2017-01-15 ENCOUNTER — Encounter: Payer: Self-pay | Admitting: Physician Assistant

## 2017-01-15 ENCOUNTER — Encounter: Payer: Self-pay | Admitting: Cardiology

## 2017-01-15 DIAGNOSIS — R1031 Right lower quadrant pain: Secondary | ICD-10-CM | POA: Diagnosis not present

## 2017-01-15 DIAGNOSIS — R7989 Other specified abnormal findings of blood chemistry: Secondary | ICD-10-CM | POA: Diagnosis not present

## 2017-01-15 DIAGNOSIS — I5023 Acute on chronic systolic (congestive) heart failure: Secondary | ICD-10-CM | POA: Diagnosis not present

## 2017-01-15 DIAGNOSIS — I422 Other hypertrophic cardiomyopathy: Secondary | ICD-10-CM | POA: Diagnosis not present

## 2017-01-15 DIAGNOSIS — J449 Chronic obstructive pulmonary disease, unspecified: Secondary | ICD-10-CM | POA: Diagnosis not present

## 2017-01-15 DIAGNOSIS — F17211 Nicotine dependence, cigarettes, in remission: Secondary | ICD-10-CM | POA: Diagnosis not present

## 2017-01-15 DIAGNOSIS — I421 Obstructive hypertrophic cardiomyopathy: Secondary | ICD-10-CM | POA: Insufficient documentation

## 2017-01-15 DIAGNOSIS — I4892 Unspecified atrial flutter: Secondary | ICD-10-CM | POA: Diagnosis not present

## 2017-01-15 DIAGNOSIS — R748 Abnormal levels of other serum enzymes: Secondary | ICD-10-CM | POA: Diagnosis not present

## 2017-01-15 DIAGNOSIS — F17210 Nicotine dependence, cigarettes, uncomplicated: Secondary | ICD-10-CM | POA: Diagnosis not present

## 2017-01-15 DIAGNOSIS — Z6823 Body mass index (BMI) 23.0-23.9, adult: Secondary | ICD-10-CM | POA: Diagnosis not present

## 2017-01-15 DIAGNOSIS — J9601 Acute respiratory failure with hypoxia: Secondary | ICD-10-CM | POA: Diagnosis not present

## 2017-01-15 DIAGNOSIS — N182 Chronic kidney disease, stage 2 (mild): Secondary | ICD-10-CM | POA: Diagnosis not present

## 2017-01-15 DIAGNOSIS — E876 Hypokalemia: Secondary | ICD-10-CM | POA: Diagnosis not present

## 2017-01-15 DIAGNOSIS — E871 Hypo-osmolality and hyponatremia: Secondary | ICD-10-CM | POA: Diagnosis not present

## 2017-01-15 DIAGNOSIS — R109 Unspecified abdominal pain: Secondary | ICD-10-CM | POA: Diagnosis not present

## 2017-01-15 DIAGNOSIS — E872 Acidosis: Secondary | ICD-10-CM | POA: Diagnosis not present

## 2017-01-15 DIAGNOSIS — R0602 Shortness of breath: Secondary | ICD-10-CM | POA: Diagnosis not present

## 2017-01-15 DIAGNOSIS — I5022 Chronic systolic (congestive) heart failure: Secondary | ICD-10-CM | POA: Diagnosis not present

## 2017-01-15 DIAGNOSIS — F410 Panic disorder [episodic paroxysmal anxiety] without agoraphobia: Secondary | ICD-10-CM | POA: Diagnosis not present

## 2017-01-15 DIAGNOSIS — I493 Ventricular premature depolarization: Secondary | ICD-10-CM | POA: Diagnosis not present

## 2017-01-15 DIAGNOSIS — R61 Generalized hyperhidrosis: Secondary | ICD-10-CM | POA: Diagnosis not present

## 2017-01-15 DIAGNOSIS — R9431 Abnormal electrocardiogram [ECG] [EKG]: Secondary | ICD-10-CM | POA: Diagnosis not present

## 2017-01-15 DIAGNOSIS — J441 Chronic obstructive pulmonary disease with (acute) exacerbation: Secondary | ICD-10-CM | POA: Diagnosis not present

## 2017-01-15 DIAGNOSIS — I4891 Unspecified atrial fibrillation: Secondary | ICD-10-CM | POA: Diagnosis not present

## 2017-01-15 DIAGNOSIS — I480 Paroxysmal atrial fibrillation: Secondary | ICD-10-CM | POA: Diagnosis not present

## 2017-01-15 DIAGNOSIS — Z87891 Personal history of nicotine dependence: Secondary | ICD-10-CM | POA: Diagnosis not present

## 2017-01-15 DIAGNOSIS — I21A1 Myocardial infarction type 2: Secondary | ICD-10-CM | POA: Diagnosis not present

## 2017-01-15 DIAGNOSIS — R002 Palpitations: Secondary | ICD-10-CM | POA: Diagnosis not present

## 2017-01-15 DIAGNOSIS — Z950 Presence of cardiac pacemaker: Secondary | ICD-10-CM | POA: Diagnosis not present

## 2017-01-15 DIAGNOSIS — R000 Tachycardia, unspecified: Secondary | ICD-10-CM | POA: Diagnosis not present

## 2017-01-15 DIAGNOSIS — R069 Unspecified abnormalities of breathing: Secondary | ICD-10-CM | POA: Diagnosis not present

## 2017-01-18 ENCOUNTER — Other Ambulatory Visit: Payer: Self-pay | Admitting: *Deleted

## 2017-01-18 ENCOUNTER — Encounter: Payer: Self-pay | Admitting: *Deleted

## 2017-01-18 MED ORDER — APIXABAN 5 MG PO TABS
5.00 | ORAL_TABLET | ORAL | Status: DC
Start: 2017-01-18 — End: 2017-01-18

## 2017-01-18 MED ORDER — FUROSEMIDE 40 MG PO TABS
40.00 | ORAL_TABLET | ORAL | Status: DC
Start: 2017-01-18 — End: 2017-01-18

## 2017-01-18 MED ORDER — ASPIRIN EC 81 MG PO TBEC
81.00 | DELAYED_RELEASE_TABLET | ORAL | Status: DC
Start: 2017-01-19 — End: 2017-01-18

## 2017-01-18 MED ORDER — TOPIRAMATE 25 MG PO TABS
50.00 | ORAL_TABLET | ORAL | Status: DC
Start: 2017-01-19 — End: 2017-01-18

## 2017-01-18 MED ORDER — COLCHICINE 0.6 MG PO TABS
0.60 | ORAL_TABLET | ORAL | Status: DC
Start: ? — End: 2017-01-18

## 2017-01-18 MED ORDER — SUMATRIPTAN SUCCINATE 50 MG PO TABS
50.00 | ORAL_TABLET | ORAL | Status: DC
Start: ? — End: 2017-01-18

## 2017-01-18 MED ORDER — POTASSIUM CHLORIDE CRYS ER 20 MEQ PO TBCR
EXTENDED_RELEASE_TABLET | ORAL | Status: DC
Start: 2017-01-19 — End: 2017-01-18

## 2017-01-18 MED ORDER — GLYCOPYRROLATE-FORMOTEROL 9-4.8 MCG/ACT IN AERO
1.00 | INHALATION_SPRAY | RESPIRATORY_TRACT | Status: DC
Start: 2017-01-18 — End: 2017-01-18

## 2017-01-18 MED ORDER — ALPRAZOLAM 0.25 MG PO TABS
0.25 | ORAL_TABLET | ORAL | Status: DC
Start: ? — End: 2017-01-18

## 2017-01-18 MED ORDER — PNEUMOCOCCAL VAC POLYVALENT 25 MCG/0.5ML IJ INJ
0.50 | INJECTION | INTRAMUSCULAR | Status: DC
Start: ? — End: 2017-01-18

## 2017-01-18 MED ORDER — CARVEDILOL 3.125 MG PO TABS
3.13 | ORAL_TABLET | ORAL | Status: DC
Start: 2017-01-18 — End: 2017-01-18

## 2017-01-18 MED ORDER — CEFDINIR 300 MG PO CAPS
300.00 | ORAL_CAPSULE | ORAL | Status: DC
Start: 2017-01-18 — End: 2017-01-18

## 2017-01-18 MED ORDER — ATORVASTATIN CALCIUM 40 MG PO TABS
40.00 | ORAL_TABLET | ORAL | Status: DC
Start: 2017-01-18 — End: 2017-01-18

## 2017-01-18 MED ORDER — SPIRONOLACTONE 25 MG PO TABS
12.50 | ORAL_TABLET | ORAL | Status: DC
Start: 2017-01-19 — End: 2017-01-18

## 2017-01-18 MED ORDER — FLUOXETINE HCL 10 MG PO TABS
20.00 | ORAL_TABLET | ORAL | Status: DC
Start: 2017-01-19 — End: 2017-01-18

## 2017-01-18 MED ORDER — LORATADINE 10 MG PO TABS
10.00 | ORAL_TABLET | ORAL | Status: DC
Start: 2017-01-19 — End: 2017-01-18

## 2017-01-18 NOTE — Patient Outreach (Signed)
[facility]) Care Management  01/18/2017  Angela Shepherd [birth year] [id number]   Subjective: Telephone call to patient's home number, no answer, left HIPAA compliant voicemail message, and requested call back. Telephone call from patient's home number, spoke with patient, and HIPAA verified.  Discussed [facility] Care Management [facility]MR Transition of care follow up, patient voiced understanding, and is in agreement to follow up.   Patient states she remembers speaking with this RNCM in the past, doing better, just walked in the door from the hospital, and was discharged today.   States she was admitted to the hospital with atrial fibrillation due to not being on eliquis because of workup for hip pain.  States she initially thought she was having shortness of breath due to congestive heart failure, took rescue inhaler, which caused the atrial fibrillation to worse, and was educated at the hospital on ways to isolate the cause of the shortness of breath.   States she has a follow up appointment with primary MD on 01/24/17. Patient voices understanding of medical diagnosis and treatment plan.  Patient states he is able to manage self care and has assistance as needed.  States she is accessing the following [facility] benefits: outpatient pharmacy, hospital indemnity, and has family medical leave act (FMLA) in place.  States she is still interested in disease management through [facility] for COPD and congestive heart failure, when program is initiated with [employer].  RNCM advised will include disease management websites on the successful outreach letter.  Patient states she does not have any education material, transition of care, care coordination,  transportation, community resource, or pharmacy needs at this time.   States she is very appreciative of the follow up and is in agreement to receive [place] Management information.     Objective: Per KPN (Knowledge Performance Now, point of care  tool) and chart review, patient hospitalized 01/15/17 -01/18/17 for shortness of breath.  Patient hospitalized 08/22/16 -08/23/16 for Shortness of breath, Lactic acidosis, Hypercapnia, and  Congestive heart failure, unspecified HF chronicity, unspecified heart failure type. Hospitalized at [facility], [place]. Patient also has a history of asthma, COPD, and acute on chronic systolic congestive heart failure.     Assessment: Received [facility] Transition of care referral on 01/17/17. Transition of care follow up completed, no care management needs, and will proceed with case closure.     Plan: RNCM will send patient successful outreach letter, [facility] pamphlet, and magnet. RNCM will send case closure due to follow up completed / no care management needs request to [facility] [place] [facility].     Gurpreet Mariani H. Annia Friendly, BSN, [place] Management [facility] Telephonic CM Phone: [phone] Fax: [phone]

## 2017-01-19 LAB — CUP PACEART REMOTE DEVICE CHECK
Battery Remaining Longevity: 68 mo
Battery Remaining Percentage: 81 %
Brady Statistic AP VP Percent: 53 %
Brady Statistic AP VS Percent: 1 %
Brady Statistic RA Percent Paced: 52 %
Date Time Interrogation Session: 20190107100606
Implantable Lead Implant Date: 19940705
Implantable Lead Location: 753858
Implantable Lead Location: 753860
Implantable Lead Model: 4024
Implantable Pulse Generator Implant Date: 20140501
Lead Channel Impedance Value: 510 Ohm
Lead Channel Impedance Value: 580 Ohm
Lead Channel Pacing Threshold Amplitude: 0.75 V
Lead Channel Pacing Threshold Pulse Width: 0.4 ms
Lead Channel Sensing Intrinsic Amplitude: 5.5 mV
Lead Channel Setting Pacing Amplitude: 2.5 V
Lead Channel Setting Pacing Pulse Width: 0.4 ms
Lead Channel Setting Pacing Pulse Width: 1 ms
Lead Channel Setting Sensing Sensitivity: 2 mV
MDC IDC LEAD IMPLANT DT: 19940705
MDC IDC LEAD IMPLANT DT: 20140501
MDC IDC LEAD LOCATION: 753859
MDC IDC MSMT BATTERY VOLTAGE: 2.93 V
MDC IDC MSMT LEADCHNL LV PACING THRESHOLD PULSEWIDTH: 1 ms
MDC IDC MSMT LEADCHNL RA PACING THRESHOLD AMPLITUDE: 1 V
MDC IDC MSMT LEADCHNL RA SENSING INTR AMPL: 2.1 mV
MDC IDC MSMT LEADCHNL RV IMPEDANCE VALUE: 400 Ohm
MDC IDC MSMT LEADCHNL RV PACING THRESHOLD AMPLITUDE: 1.25 V
MDC IDC MSMT LEADCHNL RV PACING THRESHOLD PULSEWIDTH: 0.4 ms
MDC IDC SET LEADCHNL RA PACING AMPLITUDE: 2 V
MDC IDC SET LEADCHNL RV PACING AMPLITUDE: 2.25 V
MDC IDC STAT BRADY AS VP PERCENT: 46 %
MDC IDC STAT BRADY AS VS PERCENT: 1 %
Pulse Gen Model: 3210
Pulse Gen Serial Number: 2936617

## 2017-01-25 ENCOUNTER — Inpatient Hospital Stay: Payer: 59 | Admitting: Family Medicine

## 2017-01-28 ENCOUNTER — Other Ambulatory Visit (HOSPITAL_COMMUNITY): Payer: Self-pay | Admitting: Internal Medicine

## 2017-01-28 MED FILL — ALPRAZolam 0.25 MG TABS: 0.25 | 15 days supply | Qty: 30 | Fill #1

## 2017-01-30 ENCOUNTER — Other Ambulatory Visit (HOSPITAL_COMMUNITY): Payer: Self-pay | Admitting: *Deleted

## 2017-01-30 MED ORDER — TORSEMIDE 20 MG PO TABS: 20.0000 mg | ORAL_TABLET | Freq: Two times a day (BID) | ORAL | 6 refills | Status: DC

## 2017-01-30 MED FILL — TORSEMIDE 20 MG TABLET: 20 | 30 days supply | Qty: 60 | Fill #0

## 2017-01-30 MED FILL — FUROSEMIDE 80 MG TAB: 80 | 30 days supply | Qty: 60 | Fill #2

## 2017-02-06 MED FILL — SPIRONOLACTONE 25 MG TABS: 25 | 30 days supply | Qty: 15 | Fill #5

## 2017-02-06 MED FILL — CARVEDILOL 12.5 MG TABLET: 12.5 | 30 days supply | Qty: 60 | Fill #1

## 2017-02-08 ENCOUNTER — Ambulatory Visit
Admission: RE | Admit: 2017-02-08 | Discharge: 2017-02-08 | Disposition: A | Discharge status: Home / Self Care | Payer: 59 | Source: Ambulatory Visit | Attending: Family Medicine | Admitting: Family Medicine

## 2017-02-08 DIAGNOSIS — M25551 Pain in right hip: Secondary | ICD-10-CM

## 2017-02-08 MED ORDER — IOPAMIDOL (ISOVUE-M 200) INJECTION 41%
10.0000 mL | Freq: Once | INTRAMUSCULAR | Status: AC
  Administered 2017-02-08: 10 mL via INTRA_ARTICULAR

## 2017-02-11 DIAGNOSIS — Z760 Encounter for issue of repeat prescription: Secondary | ICD-10-CM | POA: Diagnosis not present

## 2017-02-25 ENCOUNTER — Other Ambulatory Visit: Payer: Self-pay | Admitting: Family Medicine

## 2017-02-25 ENCOUNTER — Other Ambulatory Visit (HOSPITAL_COMMUNITY): Payer: Self-pay | Admitting: Internal Medicine

## 2017-02-25 DIAGNOSIS — M25551 Pain in right hip: Secondary | ICD-10-CM

## 2017-02-25 MED FILL — TORSEMIDE 20 MG TABLET: 20 | 30 days supply | Qty: 60 | Fill #1

## 2017-02-25 MED FILL — SYMBICORT 160-4.5 MCG INH: 160-4.5 | 30 days supply | Qty: 10 | Fill #0

## 2017-02-25 MED FILL — IPRAT-ALBUT 0.5-3(2.5) MG/3: 0.5-2.5 (3) | 7 days supply | Qty: 90 | Fill #0

## 2017-02-25 MED FILL — ALPRAZolam 0.25 MG TABS: 0.25 | 15 days supply | Qty: 30 | Fill #2

## 2017-02-27 ENCOUNTER — Other Ambulatory Visit (HOSPITAL_COMMUNITY): Payer: Self-pay | Admitting: *Deleted

## 2017-02-27 MED ORDER — LOSARTAN POTASSIUM 25 MG PO TABS: 25.0000 mg | ORAL_TABLET | Freq: Every day | ORAL | 3 refills | Status: DC

## 2017-02-27 MED FILL — LOSARTAN POTASSIUM 25 MG TA: 25 | 90 days supply | Qty: 90 | Fill #0

## 2017-02-28 ENCOUNTER — Other Ambulatory Visit: Payer: Self-pay | Admitting: General Practice

## 2017-02-28 MED ORDER — IPRATROPIUM-ALBUTEROL 0.5-2.5 (3) MG/3ML IN SOLN: RESPIRATORY_TRACT | 0 refills | Status: DC

## 2017-03-02 DIAGNOSIS — J441 Chronic obstructive pulmonary disease with (acute) exacerbation: Secondary | ICD-10-CM | POA: Diagnosis not present

## 2017-03-02 DIAGNOSIS — I130 Hypertensive heart and chronic kidney disease with heart failure and stage 1 through stage 4 chronic kidney disease, or unspecified chronic kidney disease: Secondary | ICD-10-CM | POA: Diagnosis not present

## 2017-03-02 DIAGNOSIS — J80 Acute respiratory distress syndrome: Secondary | ICD-10-CM | POA: Diagnosis not present

## 2017-03-02 DIAGNOSIS — F419 Anxiety disorder, unspecified: Secondary | ICD-10-CM | POA: Diagnosis not present

## 2017-03-02 DIAGNOSIS — R7989 Other specified abnormal findings of blood chemistry: Secondary | ICD-10-CM | POA: Diagnosis not present

## 2017-03-02 DIAGNOSIS — I421 Obstructive hypertrophic cardiomyopathy: Secondary | ICD-10-CM | POA: Diagnosis not present

## 2017-03-02 DIAGNOSIS — R0603 Acute respiratory distress: Secondary | ICD-10-CM | POA: Diagnosis not present

## 2017-03-02 DIAGNOSIS — Z87891 Personal history of nicotine dependence: Secondary | ICD-10-CM | POA: Diagnosis not present

## 2017-03-02 DIAGNOSIS — Z7901 Long term (current) use of anticoagulants: Secondary | ICD-10-CM | POA: Diagnosis not present

## 2017-03-02 DIAGNOSIS — I480 Paroxysmal atrial fibrillation: Secondary | ICD-10-CM | POA: Diagnosis not present

## 2017-03-02 DIAGNOSIS — Z950 Presence of cardiac pacemaker: Secondary | ICD-10-CM | POA: Diagnosis not present

## 2017-03-02 DIAGNOSIS — R0602 Shortness of breath: Secondary | ICD-10-CM | POA: Diagnosis not present

## 2017-03-02 DIAGNOSIS — I5022 Chronic systolic (congestive) heart failure: Secondary | ICD-10-CM | POA: Diagnosis not present

## 2017-03-02 DIAGNOSIS — N182 Chronic kidney disease, stage 2 (mild): Secondary | ICD-10-CM | POA: Diagnosis not present

## 2017-03-03 DIAGNOSIS — I480 Paroxysmal atrial fibrillation: Secondary | ICD-10-CM | POA: Insufficient documentation

## 2017-03-05 MED ORDER — HYDRALAZINE HCL 20 MG/ML IJ SOLN
10.00 | INTRAMUSCULAR | Status: DC
Start: ? — End: 2017-03-05

## 2017-03-05 MED ORDER — ALBUTEROL SULFATE (2.5 MG/3ML) 0.083% IN NEBU
2.50 | INHALATION_SOLUTION | RESPIRATORY_TRACT | Status: DC
Start: 2017-03-03 — End: 2017-03-05

## 2017-03-05 MED ORDER — CARVEDILOL 3.125 MG PO TABS
3.13 | ORAL_TABLET | ORAL | Status: DC
Start: 2017-03-03 — End: 2017-03-05

## 2017-03-05 MED ORDER — ALPRAZOLAM 0.25 MG PO TABS
0.25 | ORAL_TABLET | ORAL | Status: DC
Start: ? — End: 2017-03-05

## 2017-03-05 MED ORDER — SODIUM CHLORIDE 0.9 % IV SOLN
INTRAVENOUS | Status: DC
Start: ? — End: 2017-03-05

## 2017-03-05 MED ORDER — ATORVASTATIN CALCIUM 40 MG PO TABS
40.00 | ORAL_TABLET | ORAL | Status: DC
Start: ? — End: 2017-03-05

## 2017-03-05 MED ORDER — IPRATROPIUM BROMIDE 0.02 % IN SOLN
.50 | RESPIRATORY_TRACT | Status: DC
Start: 2017-03-03 — End: 2017-03-05

## 2017-03-05 MED ORDER — ONDANSETRON HCL 4 MG/2ML IJ SOLN
4.00 | INTRAMUSCULAR | Status: DC
Start: ? — End: 2017-03-05

## 2017-03-05 MED ORDER — METHYLPREDNISOLONE SODIUM SUCC 40 MG IJ SOLR
40.00 | INTRAMUSCULAR | Status: DC
Start: 2017-03-03 — End: 2017-03-05

## 2017-03-05 MED ORDER — IPRATROPIUM BROMIDE 0.02 % IN SOLN
.50 | RESPIRATORY_TRACT | Status: DC
Start: ? — End: 2017-03-05

## 2017-03-05 MED ORDER — TORSEMIDE 10 MG PO TABS
20.00 | ORAL_TABLET | ORAL | Status: DC
Start: 2017-03-04 — End: 2017-03-05

## 2017-03-05 MED ORDER — ALBUTEROL SULFATE (2.5 MG/3ML) 0.083% IN NEBU
2.50 | INHALATION_SOLUTION | RESPIRATORY_TRACT | Status: DC
Start: ? — End: 2017-03-05

## 2017-03-05 MED ORDER — ACETAMINOPHEN 325 MG PO TABS
650.00 | ORAL_TABLET | ORAL | Status: DC
Start: ? — End: 2017-03-05

## 2017-03-05 MED ORDER — APIXABAN 5 MG PO TABS
5.00 | ORAL_TABLET | ORAL | Status: DC
Start: 2017-03-03 — End: 2017-03-05

## 2017-03-05 MED ORDER — LORATADINE 10 MG PO TABS
10.00 | ORAL_TABLET | ORAL | Status: DC
Start: 2017-03-04 — End: 2017-03-05

## 2017-03-05 MED ORDER — POTASSIUM CHLORIDE CRYS ER 20 MEQ PO TBCR
EXTENDED_RELEASE_TABLET | ORAL | Status: DC
Start: 2017-03-04 — End: 2017-03-05

## 2017-03-05 MED ORDER — FLUTICASONE FUROATE-VILANTEROL 100-25 MCG/INH IN AEPB
1.00 | INHALATION_SPRAY | RESPIRATORY_TRACT | Status: DC
Start: 2017-03-04 — End: 2017-03-05

## 2017-03-06 ENCOUNTER — Other Ambulatory Visit: Payer: Self-pay | Admitting: *Deleted

## 2017-03-06 NOTE — Patient Outreach (Addendum)
[facility]) Care Management  03/06/2017  Angela Shepherd 1950/09/05 [id number]   Subjective: Telephone call to patient's home number, no answer, left HIPAA compliant voicemail message, and requested call back.    Objective: Per KPN (Knowledge Performance Now, point of care tool) and chart review, patient hospitalized 03/02/17 -03/03/17 for shortness of breath and COPD exacerbation at [facility], [place], [place].   Patient hospitalized 01/15/17 -01/18/17 for shortness of breath.  Patient hospitalized 08/22/16 -08/23/16 for Shortness of breath, Lactic acidosis, Hypercapnia, and Congestive heart failure, unspecified HF chronicity, unspecified heart failure type. Hospitalized at [facility], [place]. Patient also has a history of asthma, COPD, HOCM (hypertrophic obstructive cardiomyopathy), cardiac pacemaker, Paroxysmal atrial fibrillation, and acute on chronic systolic congestive heart failure.  [facility] Care Management completed transition of care follow up on 01/18/17.     Assessment: Received UMR Transition of care referral on 03/05/17.Transition of care follow up pending patient contact.       Plan:RNCM will call patient for 2nd telephone outreach attempt, transition of care follow up, within 10 business days if no return call.       Javarus Dorner H. Annia Friendly, BSN, [place] Management [facility] Telephonic CM Phone: [phone] Fax: [phone]

## 2017-03-07 ENCOUNTER — Other Ambulatory Visit: Payer: Self-pay | Admitting: *Deleted

## 2017-03-07 ENCOUNTER — Encounter: Payer: Self-pay | Admitting: *Deleted

## 2017-03-07 ENCOUNTER — Ambulatory Visit: Payer: Self-pay | Admitting: *Deleted

## 2017-03-07 NOTE — Patient Outreach (Addendum)
[facility]) Care Management  03/07/2017  Angela Shepherd 1950-11-30 [id number]   Subjective: Received voicemail message from Ms. Angela Shepherd, states she is returning call, requested call back, and can be reached at work number [phone]) until 3:30pm. Telephone call to patient's home number, spoke with patient, and HIPAA verified.  Discussed [name] Care Management UMR Transition of care follow up, patient voiced understanding, and is in agreement to follow up.  Patient states she is doing ok, remembers speaking with this RNCM in the past.  States she has returned to work, has had a very busy week at work, has not had time to schedule hospital follow up appointments with primary MD, cardiologist, or pulmonologist.  States this is the second time she has become unable to breath, unable to talk, and taken by ambulance to the hospital for admission with shortness of breath.  States her symptoms and hospitalizations are medication related, and not related to her COPD.  States the last episode happened while [place] [address], was able to pull the car off the road at a rest stop, and call 911.  States she did not use rescuer inhaler or antianxiety medications prior to this episode per hospital MD's directions to avoid heart issues that she experienced during January 2019 admission.  States these episodes are getting very dangerous, she taking medications as prescribed, and she is at a lost of what to do for prevention.  States the MD's at the hospital are all on different pages as to why this keeps happening.   She is planning to follow up with primary MD to assist with provider follow up and getting to the cause of the episodes.  States she was hospitalized both times in [facility] [place] which is the  nearest ED to her home and treated by hospitalist that are not her regular providers.  States she has not followed up with primary MD or specialist since last  hospitalization in January of 2019 because she has been so busy, ended back in the hospital before she could get follow ups scheduled.  Discussed the importance of hospital follow up appointment, coordinating care with primary MD, making sure all her healthcare providers are aware of her hospitalizations, patient voices understanding, and states she will follow up.   States she will call to schedule all the hospital follow up appointments by end of business 03/11/17 and does not need assistance from [facility].    Patient is agreement to follow up call from this RNCM to verify patient has appointments scheduled with primary MD, pulmonologist, and cardiologist.  Patient states she is able to manage self care and has assistance as needed with activities of daily living / home management.  Patient voices understanding of medical diagnosis, treatment plan,  is planning to seek additional clarification from primary MD, and her other specialist.  States she is accessing the following [facility] benefits: outpatient pharmacy, hospital indemnity, and has family medical leave act (FMLA) in place. States she is still interested in long term disease management through [facility] for COPD and congestive heart failure, when program is initiated with [employer].  RNCM advised [place] program for COPD and congestive Heart failure have not been implemented to date and implementation date is unknown at this point.   Patient states she does not have any education material, transition of care, care coordination,  transportation, community resource, or pharmacy needs at this time. 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      Objective:Per KPN (Knowledge Performance Now, point of care tool) and chart review,patient hospitalized 03/02/17 -03/03/17 for shortness of breath and COPD exacerbation at [facility], [place], [place].   Patient  hospitalized 01/15/17 -01/18/17 for shortness of breath. Patient hospitalized 08/22/16 -08/23/16 for Shortness of breath, Lactic acidosis, Hypercapnia, and Congestive heart failure, unspecified HF chronicity, unspecified heart failure type. Hospitalized at [facility], [place]. Patient also has a history of asthma, COPD, HOCM (hypertrophic obstructive cardiomyopathy), cardiac pacemaker, Paroxysmal atrial fibrillation, and acute on chronic systolic congestive heart failure.  [facility] Care Management completed transition of care follow up on 01/18/17.       Assessment: Received UMR Transition of care referral on 03/05/17.Transition of care follow up completed and will follow up patient to verify follow up appointments have been scheduled.        Plan:RNCM will call patient for follow up appointment verification within 10 business days if no return call. RNCM will send patient successful outreach letter, [facility] pamphlet, and magnet.     Angela Shepherd H. Annia Friendly, BSN, [place] Management [facility] Telephonic CM Phone: [phone] Fax: [phone]

## 2017-03-08 ENCOUNTER — Telehealth (HOSPITAL_COMMUNITY): Payer: Self-pay | Admitting: *Deleted

## 2017-03-08 NOTE — Telephone Encounter (Signed)
Received fax from [place] requesting for pt to hold her Eliquis 48 hours prior to an intra articular hip injection.    Per Dr Haroldine Laws "authorization granted for this injection and any additional injections the patient may be scheduled for in the next 6 months."  Form faxed back to them at [phone]

## 2017-03-12 ENCOUNTER — Telehealth (HOSPITAL_COMMUNITY): Payer: Self-pay | Admitting: *Deleted

## 2017-03-12 ENCOUNTER — Encounter (HOSPITAL_COMMUNITY): Payer: Self-pay

## 2017-03-12 ENCOUNTER — Ambulatory Visit: Payer: Self-pay | Admitting: *Deleted

## 2017-03-12 ENCOUNTER — Other Ambulatory Visit: Payer: Self-pay | Admitting: *Deleted

## 2017-03-12 ENCOUNTER — Ambulatory Visit (HOSPITAL_COMMUNITY)
Admission: RE | Admit: 2017-03-12 | Discharge: 2017-03-12 | Disposition: A | Discharge status: Home / Self Care | Payer: 59 | Source: Ambulatory Visit | Attending: Internal Medicine | Admitting: Internal Medicine

## 2017-03-12 VITALS — BP 112/66 | HR 84 | Wt 127.2 lb

## 2017-03-12 DIAGNOSIS — R0789 Other chest pain: Secondary | ICD-10-CM | POA: Insufficient documentation

## 2017-03-12 DIAGNOSIS — F329 Major depressive disorder, single episode, unspecified: Secondary | ICD-10-CM

## 2017-03-12 DIAGNOSIS — Z7951 Long term (current) use of inhaled steroids: Secondary | ICD-10-CM | POA: Insufficient documentation

## 2017-03-12 DIAGNOSIS — Z7982 Long term (current) use of aspirin: Secondary | ICD-10-CM | POA: Diagnosis not present

## 2017-03-12 DIAGNOSIS — Z7901 Long term (current) use of anticoagulants: Secondary | ICD-10-CM | POA: Diagnosis not present

## 2017-03-12 DIAGNOSIS — I480 Paroxysmal atrial fibrillation: Secondary | ICD-10-CM | POA: Insufficient documentation

## 2017-03-12 DIAGNOSIS — I421 Obstructive hypertrophic cardiomyopathy: Secondary | ICD-10-CM | POA: Insufficient documentation

## 2017-03-12 DIAGNOSIS — I10 Essential (primary) hypertension: Secondary | ICD-10-CM

## 2017-03-12 DIAGNOSIS — Z79899 Other long term (current) drug therapy: Secondary | ICD-10-CM | POA: Diagnosis not present

## 2017-03-12 DIAGNOSIS — I5032 Chronic diastolic (congestive) heart failure: Secondary | ICD-10-CM | POA: Diagnosis not present

## 2017-03-12 DIAGNOSIS — I110 Hypertensive heart disease with heart failure: Secondary | ICD-10-CM | POA: Insufficient documentation

## 2017-03-12 DIAGNOSIS — I420 Dilated cardiomyopathy: Secondary | ICD-10-CM

## 2017-03-12 DIAGNOSIS — Z87891 Personal history of nicotine dependence: Secondary | ICD-10-CM | POA: Diagnosis not present

## 2017-03-12 DIAGNOSIS — I5022 Chronic systolic (congestive) heart failure: Secondary | ICD-10-CM | POA: Diagnosis not present

## 2017-03-12 DIAGNOSIS — Z950 Presence of cardiac pacemaker: Secondary | ICD-10-CM | POA: Insufficient documentation

## 2017-03-12 DIAGNOSIS — F419 Anxiety disorder, unspecified: Secondary | ICD-10-CM | POA: Insufficient documentation

## 2017-03-12 DIAGNOSIS — F32A Depression, unspecified: Secondary | ICD-10-CM

## 2017-03-12 DIAGNOSIS — J449 Chronic obstructive pulmonary disease, unspecified: Secondary | ICD-10-CM | POA: Diagnosis not present

## 2017-03-12 LAB — CBC
HCT: 41.9 % (ref 36.0–46.0)
Hemoglobin: 14.2 g/dL (ref 12.0–15.0)
MCH: 32.3 pg (ref 26.0–34.0)
MCHC: 33.9 g/dL (ref 30.0–36.0)
MCV: 95.2 fL (ref 78.0–100.0)
Platelets: 223 10*3/uL (ref 150–400)
RBC: 4.4 MIL/uL (ref 3.87–5.11)
RDW: 13 % (ref 11.5–15.5)
WBC: 5.8 10*3/uL (ref 4.0–10.5)

## 2017-03-12 LAB — BASIC METABOLIC PANEL
Anion gap: 14 (ref 5–15)
BUN: 21 mg/dL — ABNORMAL HIGH (ref 6–20)
CALCIUM: 9.1 mg/dL (ref 8.9–10.3)
CO2: 23 mmol/L (ref 22–32)
Chloride: 102 mmol/L (ref 101–111)
Creatinine, Ser: 1.17 mg/dL — ABNORMAL HIGH (ref 0.44–1.00)
GFR calc Af Amer: 55 mL/min — ABNORMAL LOW (ref >60)
GFR, EST NON AFRICAN AMERICAN: 48 mL/min — AB (ref >60)
GLUCOSE: 88 mg/dL (ref 65–99)
Potassium: 3.5 mmol/L (ref 3.5–5.1)
Sodium: 139 mmol/L (ref 135–145)

## 2017-03-12 MED ORDER — SPIRONOLACTONE 25 MG PO TABS: 25.0000 mg | ORAL_TABLET | Freq: Every day | ORAL | 11 refills | Status: DC

## 2017-03-12 MED FILL — IPRAT-ALBUT 0.5-3(2.5) MG/3: 0.5-2.5 (3) | 7 days supply | Qty: 90 | Fill #0

## 2017-03-12 MED FILL — SPIRONOLACTONE 25 MG TABS: 25 | 30 days supply | Qty: 30 | Fill #0

## 2017-03-12 MED FILL — CARVEDILOL 12.5 MG TABLET: 12.5 | 30 days supply | Qty: 60 | Fill #2

## 2017-03-12 MED FILL — ELIQUIS 5 MG TABLET: 5 | 30 days supply | Qty: 60 | Fill #1

## 2017-03-12 NOTE — Progress Notes (Signed)
Advanced Heart Failure Clinic Note   Patient ID: [name], female   DOB: April 29, 1950, 67 y.o.   MRN: [id number] PCP: Dr. Birdie Riddle  HPI: Angela Shepherd is a 67 y.o. 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 PMHx also notable for HTN and prior tobacco abuse (quit 02/2010).   Was admitted in December 2011 with acute HF. Echo with EF 30-35%, There was also evidence of ASH with septum of 1.7 PW = 1.9. Diuresed and scheduled for outpt cath.  Cath 12/2009: Normal coronaries. EF 40-45%.  Central aortic pressure was 86/47 with a mean of 62.  LV pressure is 74/13 with an EDP of 2.  Right atrial pressure mean of 3.  RV pressure 26/3 with an EDP of 5.  PA pressure 28/5 with a mean of 16.  Pulmonary capillary wedge pressure mean of 7.  Fick cardiac output was 3.4 and cardiac index 2.1.   Echo in March 2012:  EF 50-55%. Grade 2 diastolic dysfunction. IVS 1.4cm.  Echo 4/14: EF 40-45%  ECHO 09/03/13: EF 40-45% Echo 4/16: EF 45-50%  Was seen by EP and noted to have a high percentage of RV pacing (99%) and it was felt this was worsening LV function. Underwent upgrade to [facility]. Jude CRT-P in 5/14.  Echo in 4/16 showed EF 45-50% with mild LVH.   Pt admitted to [facility] 08/2016 for CHF. Required BiPAP on admission but quickly weaned off O2. Pt denied medical or dietary non-compliance. Was told about Cardiomems device which she would like to pursue if she is a candidate.   Pt states she was admitted to [facility] again last week (09/20/16) and diuresed 8 lbs. No changes were made to her chronic medications.   Admitted in January to [facility] for volume overload (BNP 1400), and then in February for volume depletion requiring IVF. She states she was discharged 8 lbs up from her baseline weight.   She presents today for regular follow up. She is feeling good overall, since recent hospitalization, but frustrated. She continues to have  intermittent episodes of tachypalpitations, SOB, and diaphoresis preceding admissions. She does not have "mild" episodes. She either feels good or terrible. She denies SOB with exertion at baseline.  She denies lightheadedness or dizziness. No near syncope or chest pain.  She has had mild troponin elevated during admission at [facility], but nothing > 0.1 She doesn't feel like her prozac is helping very much with her anxiety. Sees her PCP next week. She takes torsemide 20 mg daily, but has been taking LASIX on days she needs extra, instead of extra torsemide.   Echo 08/22/16 At [facility]: LVEF 40-45%, mild MR, Mild TR  SH: Nonsmoker, works at [employer], lives in [place].   Review of systems complete and found to be negative unless listed in HPI.    Past Medical History:  Diagnosis Date  . Bradycardia    a. initial PPM placed 1994 at [facility] with gen change 2002. b. 1*AVB & intermittent 2nd degree AVB + CHF --> upgrade to [facility]. Jude BiV PPM 05/08/12.  . Chronic systolic CHF (congestive heart failure) ([place])    a. EF 30-35% dx in 2011 -> most recent 40-45% by echo 04/2012. b. s/p upgrade to BiV-PPM 05/08/12. c. Med titration limited by hypotension.  . Cluster headache syndrome   . Hypertr obst cardiomyop   . Hypokalemia   . Hypotension   .  Paroxysmal atrial fibrillation (HCC)    a. identified on PPM interrogation b. longest episode 1 hour; if burden increases, will need [place]   . Pneumonia 2008    Current Outpatient Medications  Medication Sig Dispense Refill  . albuterol (PROVENTIL HFA;VENTOLIN HFA) 108 (90 Base) MCG/ACT inhaler Inhale 1-2 puffs into the lungs every 6 (six) hours as needed for wheezing or shortness of breath. 1 Inhaler 1  . ALPRAZolam (XANAX) 0.25 MG tablet Take 1 tablet (0.25 mg total) by mouth 2 (two) times daily as needed for anxiety. 30 tablet 3  . AMBULATORY NON FORMULARY MEDICATION Medication Name: 100% O2 15 leters a min for 15-20 mins  Via Nasal cannual 1 Bottle 2  . amitriptyline  (ELAVIL) 10 MG tablet Take 10 mg by mouth.    Marland Kitchen apixaban (ELIQUIS) 5 MG TABS tablet Take 1 tablet (5 mg total) by mouth 2 (two) times daily. 60 tablet 3  . aspirin 81 MG chewable tablet Chew 81 mg by mouth daily.    . carvedilol (COREG) 12.5 MG tablet TAKE 1 TABLET (12.5 MG TOTAL) BY MOUTH 2 (TWO) TIMES DAILY WITH A MEAL. 60 tablet 3  . cyclobenzaprine (FLEXERIL) 5 MG tablet TAKE 1 TABLET (5 MG TOTAL) BY MOUTH 3 (THREE) TIMES DAILY AS NEEDED FOR MUSCLE SPASMS. 30 tablet 2  . diclofenac sodium (VOLTAREN) 1 % GEL Apply 2 g topically 4 (four) times daily. 3 Tube 0  . FLUoxetine (PROZAC) 20 MG capsule Take 1 capsule (20 mg total) by mouth daily. 30 capsule 3  . fluticasone (FLONASE) 50 MCG/ACT nasal spray PLACE 2 SPRAYS INTO BOTH NOSTRILS DAILY. 16 g 5  . furosemide (LASIX) 80 MG tablet   3  . ipratropium-albuterol (DUONEB) 0.5-2.5 (3) MG/3ML SOLN USE 1 VIAL (3ML) BY NEBULIZER EVERY 4 TO 6 HOURS AS NEEDED 90 mL 0  . loratadine (CLARITIN) 10 MG tablet Take 1 tablet (10 mg total) by mouth daily. 30 tablet 5  . losartan (COZAAR) 25 MG tablet Take 1 tablet (25 mg total) by mouth daily. 30 tablet 3  . nicotine (NICODERM CQ - DOSED IN MG/24 HOURS) 14 mg/24hr patch Place 1 patch (14 mg total) onto the skin daily. 28 patch 0  . potassium chloride SA (K-DUR,KLOR-CON) 20 MEQ tablet Take 1 tablet (20 mEq total) by mouth daily. 90 tablet 3  . PULMICORT FLEXHALER 180 MCG/ACT inhaler   1  . Spacer/Aero-Holding Chambers (AEROCHAMBER MV) inhaler Use as instructed with Symbicort 1 each 0  . spironolactone (ALDACTONE) 25 MG tablet TAKE 1/2 TABLET BY MOUTH DAILY 15 tablet 6  . SUMAtriptan (IMITREX) 50 MG tablet TAKE 1 TABLET AT ONSET OF HEADACHE. MAY REPEAT IN 2 HOURS IF HEADACHE PERSISTS OR RECURS. 10 tablet 6  . SYMBICORT 160-4.5 MCG/ACT inhaler INHALE 2 PUFFS BY MOUTH INTO THE LUNGS 2 TIMES A DAY 10.2 g 3  . topiramate (TOPAMAX) 50 MG tablet Take 50 mg by mouth daily.    Marland Kitchen torsemide (DEMADEX) 20 MG tablet TAKE 1  TABLET BY MOUTH 2 TIMES DAILY. 60 tablet 3  . torsemide (DEMADEX) 20 MG tablet Take 1 tablet (20 mg total) by mouth 2 (two) times daily. 60 tablet 6   No current facility-administered medications for this encounter.    PHYSICAL EXAM: Vitals:   03/12/17 1354  BP: 112/66  Pulse: 84  SpO2: 97%  Weight: 127 lb 3.2 oz (57.7 kg)    Wt Readings from Last 3 Encounters:  03/12/17 127 lb 3.2 oz (57.7 kg)  01/07/17 120 lb (54.4 kg)  11/28/16 125 lb 12.8 oz (57.1 kg)    Physical Exam.  General: Well appearing. No resp difficulty. Anxious HEENT: Normal Neck: Supple. JVP 5-6. Carotids 2+ bilat; no bruits. No thyromegaly or nodule noted. Cor: PMI nondisplaced. RRR, PPM L upper chest. Mild TR murmur Lungs: CTAB, normal effort. Abdomen: Soft, non-tender, non-distended, no HSM. No bruits or masses. +BS  Extremities: No cyanosis, clubbing, or rash. R and LLE no edema.  Neuro: Alert & orientedx3, cranial nerves grossly intact. moves all 4 extremities w/o difficulty. Affect pleasant   ASSESSMENT & PLAN: 1. Chronic systolic CHF: EF 75% s/p CRT-P upgrade 05/2012 (due to chronic RV pacing)  - Echo 08/22/16 At [facility]: LVEF 40-45%, mild MR, Mild TR - NYHA class II settings at baseline, but intermittently has sudden episodes of SOB. Seem at least partially tied to her HF. Will consider CPX.  - Volume status stable on exam.  - Continue torsemide 20 mg BID. BMET today. Warned against using lasix AND torsemide.  - Continue Coreg 12.5mg  BID.  Has been intolerant to up-titration.  - Increase spiro to 25 mg daily. Repeat BMET 10 days.  - Continue losartan 25 mg daily.  - Reinforced fluid restriction to < 2 L daily, sodium restriction to less than 2000 mg daily, and the importance of daily weights.   2. H/o HOCM  - Stable on most recent Echo.  3. HTN - Stable on current meds.  4. Tobacco use quit 2012 - No further.  - PFTs relatively stable 06/2016. 5. Atypical chest pain - No further. Suspect GI  etiology - Asked pt to follow up with any repeat chest pain.  6. Anxiety - Major driving factor in many of her presentations - On Prozac. Sees PCP next week.   RTC 2 months. Meds and labs as above.   Of note, patient has not been seen at [facility] for one of her "episodes". She feels better quickly after arriving to hospital.  Suspect anxiety is a large component, and her episodes seem similar to panic attacks.   Could consider CPX, though her functional status is stable at baseline. Will have MD weight in at next visit. She sees pulmonary in 1 month.    Angela Friar, PA-C  03/12/2017

## 2017-03-12 NOTE — Telephone Encounter (Signed)
Received surgical clearance from [facility] requesting for patient to hold Eliquis 48 hours prior to hip injection.    Per Dr. Haroldine Laws, patient is cleared to hold Eliquis 48 hours prior. Clearance faxed today to [phone].

## 2017-03-12 NOTE — Patient Instructions (Signed)
STOP Lasix.  INCREASE Spironolactone to 25 mg (1 whole tablet) once daily.  Routine lab work today. Will notify you of abnormal results, otherwise no news is good news!  Return in 1-2 weeks for repeat labs.  __________________________________________________________ Angela Shepherd Code: 9002  Follow up with Dr. Haroldine Laws in 2 months.  __________________________________________________________ Angela Shepherd Code: 8182  Take all medication as prescribed the day of your appointment. Bring all medications with you to your appointment.  Do the following things EVERYDAY: 1) Weigh yourself in the morning before breakfast. Write it down and keep it in a log. 2) Take your medicines as prescribed 3) Eat low salt foods-Limit salt (sodium) to 2000 mg per day.  4) Stay as active as you can everyday 5) Limit all fluids for the day to less than 2 liters

## 2017-03-12 NOTE — Patient Outreach (Addendum)
[facility]) Care Management  03/12/2017  Angela Shepherd Jun 19, 1950 [id number]   Subjective: Telephone call to patient's work number, per her request, spoke with patient, and HIPAA verified.  Patient states she is doing well, has scheduled all the hospital follow up appointments, will schedule earlier appointment with pulmonologist is needed, and daughter will be accompanying her to the appointments.  States she has appointment today with cardiologist, primary MD on 03/25/17, and pulmonologist on 04/08/17.  Discussed getting an earlier appointment with pulmonologist, states this  Dr. Agustina Caroli first available and she would be prefer to see him instead of one of his partners, due to long standing relationship.  States she is aware of signs / symptoms to report to MD and when to request earlier appointment. States her goal is to avoid future hospitalizations, will discuss the episodes of shortness of breath, tell her providers all the same time, get insight on the origin, and get a sustainable treatment plan in place for prevention with each of her healthcare providers.  States she will call this RNCM when she has completed all follow up appointment and give an update.  Patient declines short case management at this time or biweekly follow up.  States she will follow up with this RNCM as needed.  States she canceled her 01/24/17 appointment with primary MD because she is afraid to drive long distance alone due to the shortness of breath episodes, and her daughter was unable to take her to the appointment.    Discussed the importance of hospitalization follow up, patient voices understanding, and states her daughter will be able to accompany her to appointments as needed.  States she may be having some anxiety and will also discuss this with MD's during follow up appointments.  [facility] benefits discussed on 03/07/17  transition of care call and patient states no additional questions at this time. Patient  states he does not have any education material, transition of care, care coordination, disease management, disease monitoring, transportation, community resource, or pharmacy needs at this time.   States she would be interested in the [facility] Congestive Heart Failure program in the future when available.  RNCM advised will notify patient when [facility] program available.  States she is very appreciative of the follow up and is in agreement to receive [place] Management information.       Objective:Per KPN (Knowledge Performance Now, point of care tool) and chart review,patient [facility]17 [facility] [place] [place]. Patient hospitalized 01/15/17 -01/18/17 for shortness of breath. Patient hospitalized 08/22/16 -08/23/16 for Shortness of breath, Lactic acidosis, Hypercapnia, and Congestive heart failure, unspecified HF chronicity, unspecified heart failure type. Hospitalized at [facility], [place]. Patient also has a history of asthma, COPD, HOCM (hypertrophic obstructive cardiomyopathy), cardiac pacemaker,Paroxysmal atrial fibrillation,and acute on chronic systolic congestive heart failure.[facility] Care Management completed transition of care follow up on 01/18/17 and 03/07/17.       Assessment: Received UMR Transition of care referral on2/26/19.Transition of care follow up completed and follow up appointment dates verified.      Plan:RNCM will send case closure due to follow up completed / no care management needs request to [facility].      Robert Sperl H. Annia Friendly, BSN, [place] Management [facility] Telephonic CM Phone: [phone] Fax: [phone]

## 2017-03-25 ENCOUNTER — Ambulatory Visit (INDEPENDENT_AMBULATORY_CARE_PROVIDER_SITE_OTHER): Payer: 59 | Admitting: Family Medicine

## 2017-03-25 ENCOUNTER — Ambulatory Visit (HOSPITAL_COMMUNITY)
Admission: RE | Admit: 2017-03-25 | Discharge: 2017-03-25 | Disposition: A | Discharge status: Home / Self Care | Payer: 59 | Source: Ambulatory Visit | Attending: Cardiology | Admitting: Cardiology

## 2017-03-25 ENCOUNTER — Encounter: Payer: Self-pay | Admitting: Family Medicine

## 2017-03-25 ENCOUNTER — Other Ambulatory Visit: Payer: Self-pay

## 2017-03-25 ENCOUNTER — Encounter: Payer: Self-pay | Admitting: General Practice

## 2017-03-25 VITALS — BP 96/72 | HR 63 | Temp 98.3°F | Resp 16 | Ht 62.0 in | Wt 131.2 lb

## 2017-03-25 DIAGNOSIS — I5032 Chronic diastolic (congestive) heart failure: Secondary | ICD-10-CM | POA: Diagnosis not present

## 2017-03-25 DIAGNOSIS — F419 Anxiety disorder, unspecified: Secondary | ICD-10-CM | POA: Diagnosis not present

## 2017-03-25 DIAGNOSIS — J449 Chronic obstructive pulmonary disease, unspecified: Secondary | ICD-10-CM

## 2017-03-25 DIAGNOSIS — F329 Major depressive disorder, single episode, unspecified: Secondary | ICD-10-CM | POA: Diagnosis not present

## 2017-03-25 DIAGNOSIS — F32A Depression, unspecified: Secondary | ICD-10-CM

## 2017-03-25 LAB — BASIC METABOLIC PANEL
ANION GAP: 11 (ref 5–15)
BUN: 30 mg/dL — ABNORMAL HIGH (ref 6–20)
CHLORIDE: 102 mmol/L (ref 101–111)
CO2: 25 mmol/L (ref 22–32)
Calcium: 9.2 mg/dL (ref 8.9–10.3)
Creatinine, Ser: 1.57 mg/dL — ABNORMAL HIGH (ref 0.44–1.00)
GFR calc non Af Amer: 34 mL/min — ABNORMAL LOW (ref >60)
GFR, EST AFRICAN AMERICAN: 39 mL/min — AB (ref >60)
GLUCOSE: 98 mg/dL (ref 65–99)
POTASSIUM: 4.1 mmol/L (ref 3.5–5.1)
Sodium: 138 mmol/L (ref 135–145)

## 2017-03-25 MED ORDER — ALPRAZOLAM 0.25 MG PO TABS: 0.2500 mg | ORAL_TABLET | Freq: Two times a day (BID) | ORAL | 3 refills | Status: DC | PRN

## 2017-03-25 MED ORDER — FLUOXETINE HCL 20 MG PO CAPS: 20.0000 mg | ORAL_CAPSULE | Freq: Every day | ORAL | 3 refills | Status: DC

## 2017-03-25 MED FILL — FLUoxetine HCL 20 MG CAPS: 20 | 30 days supply | Qty: 30 | Fill #0

## 2017-03-25 MED FILL — ALPRAZolam 0.25 MG TABS: 0.25 | 15 days supply | Qty: 30 | Fill #0

## 2017-03-25 NOTE — Progress Notes (Signed)
   Subjective:    Patient ID: Angela Shepherd, female    DOB: 1950/12/26, 67 y.o.   MRN: [id number]  [facility] f/u- pt was admitted 2/23-24 for COPD exacerbation.  She was treated w/ IV steroids, bronchodilators.  No abx given and was not d/c'd home w/ oral steroids.  Pt's Lasix was switched to Torsemide and they had her stop her colchicine.  It was mentioned in her d/c summary to stop Prozac but pt continues on this 'daily, without fail'.  D/C summary indicated that her exacerbation was likely anxiety driven and not truly lung based.  Pt reports 'I have been 6 times (to the hospital) and I've had 6 different answers'.  Pt is frustrated by this.  Pt denies cough when sxs occur and she is suddenly SOB.   Review of Systems For ROS see HPI     Objective:   Physical Exam  Constitutional: She is oriented to person, place, and time. She appears well-developed and well-nourished. No distress.  HENT:  Head: Normocephalic and atraumatic.  Eyes: Conjunctivae and EOM are normal. Pupils are equal, round, and reactive to light.  Neck: Normal range of motion. Neck supple. No thyromegaly present.  Cardiovascular: Normal rate, regular rhythm, normal heart sounds and intact distal pulses.  No murmur heard. Pulmonary/Chest: Effort normal and breath sounds normal. She has no wheezes. She has no rales.  Pt appears SOB but O2 sats normal and lungs CTAB in office  Abdominal: Soft. She exhibits no distension. There is no tenderness.  Musculoskeletal: She exhibits no edema.  Lymphadenopathy:    She has no cervical adenopathy.  Neurological: She is alert and oriented to person, place, and time.  Skin: Skin is warm and dry.  Psychiatric: Her behavior is normal.  anxious  Vitals reviewed.         Assessment & Plan:

## 2017-03-25 NOTE — Assessment & Plan Note (Signed)
Ongoing issue.  Pt reports she is taking her Prozac daily but has refill records indicate that she should have been out months ago.  She now takes Alprazolam PRN- which is appropriate.  I encouraged her to take Alprazolam at the onset of her SOB as there seems to be a panic component to this.  The only other thing a can attribute her sudden onset SOB would be flash pulmonary edema- which would also cause panic.  Encouraged her to discuss this w/ cardiology.  Refills provided on Alprazolam and Prozac today.

## 2017-03-25 NOTE — Assessment & Plan Note (Signed)
Pt's most recent hospitalization was attributed to COPD exacerbation but this does not seem consistent w/ her sxs- no cough, no preceding illness, no gradual worsening of sxs.  Pt has upcoming appt w/ Dr Lamonte Sakai.  Will defer any change in med management to Dr Lamonte Sakai.  Pt expressed understanding and is in agreement w/ plan.

## 2017-03-25 NOTE — Patient Instructions (Signed)
Follow up as needed or as scheduled Continue to take your Prozac daily- this will help with your anxiety Use the Alprazolam as needed for high stress moments The sudden onset of these symptoms do not sound like COPD.  It sounds more consistent w/ flash pulmonary edema or a panic attack (or both) Call with any questions or concerns Hang in there!!!

## 2017-03-28 ENCOUNTER — Other Ambulatory Visit: Payer: Self-pay | Admitting: *Deleted

## 2017-03-28 NOTE — Patient Outreach (Addendum)
Angela Shepherd [facility]) Care Management  03/28/2017  Angela Shepherd 1950-07-14 [id number]  Subjective: Telephone call to patient's home number, spoke with patient, and HIPAA verified.  Discussed [facility] Care Management [facility] Consult follow up, patient voiced understanding, and is in agreement to follow up.   Remembers speaking with RNCM in the past.    Patient states she has seen her primary MD and cardiologist for hospital follow up since last conversation with this RNCM, appointments went well, and she is in agreement with the updated treatment plan. States her primary feels her symptoms may be related to anxiety and pulmonary flash edema. She is working on decreasing her anxiety, watching diet more closely, avoiding or limiting eating out at restaurants, and avoiding salt.   Discussed Congestive Heart Failure (CHF) disease management resources for [facility] employees through [place] Management and advised RNCM will send web sites in the resource outreach letter.  RNCM advised only program available at this time is through [place] [facility] and Beverley Fiedler is indefinitely not available .  Patient in agreement to a referral to [facility].  Patient states she does not have any education material, transition of care, care coordination, transportation, community resource, or pharmacy needs at this time.  States she is very appreciative of the follow up and is in agreement to receive [place] Management information.      Objective:Per KPN (Knowledge Performance Now, point of care tool) and chart review,patient [facility] 03/02/17 [facility]03[facility]03/[facility] [place] [place]. Patient hospitalized 01/15/17 -01/18/17 for shortness of breath. Patient hospitalized 08/22/16 -08/23/16 for Shortness of breath, Lactic acidosis, Hypercapnia, and Congestive heart failure, unspecified HF chronicity, unspecified heart failure type.  Hospitalized at [facility], [place]. Patient also has a history of asthma, COPD, HOCM (hypertrophic obstructive cardiomyopathy), cardiac pacemaker,Paroxysmal atrial fibrillation,and acute on chronic systolic congestive heart failure.[facility] Care Management completed transition of care follow up on 01/18/17 and 03/07/17.       Assessment: Received UMR Transition of care referral on2/26/19.Transition of care follow upcompleted and follow upappointment dates verified.  Follow up information regarding CHF disease management program obtained, patient updated, and case will remain closed.      Plan: RNCM will send patient resource outreach letter and case will remain closed.  RNCM will send request for [facility] Heart Failure Management program referral to [facility] to send to [place].         Blessen Kimbrough H. Annia Friendly, BSN, [place] Management [facility] Telephonic CM Phone: [phone] Fax: [phone]

## 2017-03-28 NOTE — Patient Outreach (Signed)
[facility]) Care Management  03/28/2017  FRANNY SELVAGE [birth year] [id number]  Subjective: Received additional information from Bary Castilla at [place] Management, states there is currently no plan for a CHF disease management program with [facility] and patient can be referred to [facility] if patient agreeable.  Telephone call to patient's home  number, no answer, left HIPAA compliant voicemail message, and requested call back.    Objective:Per KPN (Knowledge Performance Now, point of care tool) and chart review,patient [facility] 03/02/17 [facility]03[facility]03/[facility] [place] [place]. Patient hospitalized 01/15/17 -01/18/17 for shortness of breath. Patient hospitalized 08/22/16 -08/23/16 for Shortness of breath, Lactic acidosis, Hypercapnia, and Congestive heart failure, unspecified HF chronicity, unspecified heart failure type. Hospitalized at [facility] [place]. Patient also has a history of asthma, COPD, HOCM (hypertrophic obstructive cardiomyopathy), cardiac pacemaker,Paroxysmal atrial fibrillation,and acute on chronic systolic congestive heart failure.[facility] Care Management completed transition of care follow up on 01/18/17 and 03/07/17.       Assessment: Received UMR Transition of care referral on2/26/19.Transition of care follow upcompleted and follow up appointment dates verified.  Follow up information regarding CHF disease management program obtained, will update patient, pending patient contact.       Plan: RNCM will call patient for 2nd telephone outreach attempt, [facility] Consult follow up, within 10 business days if no return call.       Batoul Limes H. Annia Friendly, BSN, [place] Management [facility] Telephonic CM Phone: [phone] Fax: [phone]

## 2017-03-29 ENCOUNTER — Ambulatory Visit: Payer: 59 | Admitting: *Deleted

## 2017-03-29 LAB — PAIN MGMT, PROFILE 8 W/CONF, U
6 Acetylmorphine: NEGATIVE ng/mL (ref <10)
ALPHAHYDROXYMIDAZOLAM: NEGATIVE ng/mL (ref <50)
ALPHAHYDROXYTRIAZOLAM: NEGATIVE ng/mL (ref <50)
AMINOCLONAZEPAM: NEGATIVE ng/mL (ref <25)
Alcohol Metabolites: POSITIVE ng/mL — AB (ref <500)
Alphahydroxyalprazolam: NEGATIVE ng/mL (ref <25)
Amphetamines: NEGATIVE ng/mL (ref <500)
Benzodiazepines: NEGATIVE ng/mL (ref <100)
Buprenorphine, Urine: NEGATIVE ng/mL (ref <5)
Cocaine Metabolite: NEGATIVE ng/mL (ref <150)
Creatinine: 34.9 mg/dL
Ethyl Glucuronide (ETG): 6939 ng/mL — ABNORMAL HIGH (ref <500)
Ethyl Sulfate (ETS): 2464 ng/mL — ABNORMAL HIGH (ref <100)
HYDROXYETHYLFLURAZEPAM: NEGATIVE ng/mL (ref <50)
Lorazepam: NEGATIVE ng/mL (ref <50)
MARIJUANA METABOLITE: NEGATIVE ng/mL (ref <20)
MDMA: NEGATIVE ng/mL (ref <500)
Nordiazepam: NEGATIVE ng/mL (ref <50)
OXAZEPAM: NEGATIVE ng/mL (ref <50)
OXYCODONE: NEGATIVE ng/mL (ref <100)
Opiates: NEGATIVE ng/mL (ref <100)
Oxidant: NEGATIVE ug/mL (ref <200)
Temazepam: NEGATIVE ng/mL (ref <50)
pH: 6.57 (ref 4.5–9.0)

## 2017-03-29 MED FILL — POTASSIUM CL ER 20 MEQ TAB: 20 | 90 days supply | Qty: 90 | Fill #1

## 2017-03-29 MED FILL — TORSEMIDE 10 MG TABS: 10 | 30 days supply | Qty: 120 | Fill #0

## 2017-04-01 ENCOUNTER — Encounter: Payer: Self-pay | Admitting: General Practice

## 2017-04-05 ENCOUNTER — Ambulatory Visit
Admission: RE | Admit: 2017-04-05 | Discharge: 2017-04-05 | Disposition: A | Discharge status: Home / Self Care | Payer: 59 | Source: Ambulatory Visit | Attending: Family Medicine | Admitting: Family Medicine

## 2017-04-05 DIAGNOSIS — M1611 Unilateral primary osteoarthritis, right hip: Secondary | ICD-10-CM | POA: Diagnosis not present

## 2017-04-05 DIAGNOSIS — M25551 Pain in right hip: Secondary | ICD-10-CM

## 2017-04-05 MED ORDER — METHYLPREDNISOLONE ACETATE 40 MG/ML INJ SUSP (RADIOLOG
120.0000 mg | Freq: Once | INTRAMUSCULAR | Status: AC
  Administered 2017-04-05: 120 mg via INTRA_ARTICULAR

## 2017-04-05 MED ORDER — IOPAMIDOL (ISOVUE-M 200) INJECTION 41%
1.0000 mL | Freq: Once | INTRAMUSCULAR | Status: AC
  Administered 2017-04-05: 1 mL via INTRA_ARTICULAR

## 2017-04-05 NOTE — Discharge Instructions (Signed)

## 2017-04-08 MED FILL — CARVEDILOL 12.5 MG TABLET: 12.5 | 30 days supply | Qty: 60 | Fill #3

## 2017-04-11 MED FILL — SPIRONOLACTONE 25 MG TABS: 25 | 30 days supply | Qty: 30 | Fill #1

## 2017-04-12 ENCOUNTER — Encounter: Payer: Self-pay | Admitting: Emergency Medicine

## 2017-04-12 ENCOUNTER — Ambulatory Visit (INDEPENDENT_AMBULATORY_CARE_PROVIDER_SITE_OTHER): Payer: 59 | Admitting: Emergency Medicine

## 2017-04-12 DIAGNOSIS — J449 Chronic obstructive pulmonary disease, unspecified: Secondary | ICD-10-CM

## 2017-04-12 NOTE — Progress Notes (Signed)
Subjective:    Patient ID: Angela Shepherd, female    DOB: 08-Nov-1950, 67 y.o.   MRN: [id number]  HPI  ROV 07/30/16 -- This follow-up visit for COPD. Also with a history of cough and mucus production. We changed her ACE inhibitor to an ARB with some benefit. He function testing was done on 07/04/16 and I have reviewed. This shows severe obstruction without a bronchodilator response. Lung volume are normal suggestive of possible coexisting restriction. Decreased diffusion capacity that corrects to the normal range when adjusted for alveolar volume. She is having no cough. She is benefiting from allergy regimen. She is active, exercises every day. She is on Symbicort. Very rare albuterol use.   ROV 04/12/17 --67 year old woman with a history of tobacco use, hypertrophic obstructive cardiomyopathy with pacemaker, hypertension with chronic systolic and diastolic CHF followed in the heart failure clinic.She has moderate to severe  obstruction on pulmonary function testing without a bronchodilator response.  Currently managed on Symbicort twice a day.  She has albuterol available and uses it approximately . She has had several admissions to [facility] for what sounds like flash pulm edema, with some associated panic. Some question of ? Bronchospasm. She has been diuresed and given BD's / steroids on the hospitalizations. No cough, no wheeze at these times. Her diuretics have been recently adjusted at the Heart Failure clinic.    Review of Systems  Constitutional: Negative.  Negative for fever and unexpected weight change.  HENT: Negative for congestion, dental problem, ear pain, nosebleeds, postnasal drip, rhinorrhea, sinus pressure, sneezing, sore throat and trouble swallowing.   Eyes: Negative.  Negative for redness and itching.  Respiratory: Negative.  Negative for cough, chest tightness, shortness of breath and wheezing.   Cardiovascular: Negative.  Negative for palpitations and leg swelling.    Gastrointestinal: Negative.  Negative for nausea and vomiting.  Endocrine: Negative.   Genitourinary: Negative.  Negative for dysuria.  Musculoskeletal: Negative.  Negative for joint swelling.  Skin: Negative.  Negative for rash.  Allergic/Immunologic: Negative.   Neurological: Negative for headaches.  Hematological: Negative.  Does not bruise/bleed easily.  Psychiatric/Behavioral: Negative.  Negative for dysphoric mood. The patient is not nervous/anxious.    Past Medical History:  Diagnosis Date  . Bradycardia    a. initial PPM placed 1994 at [facility] with gen change 2002. b. 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 Jude BiV PPM 05/08/12.  . Chronic systolic CHF (congestive heart failure) ([place])    a. EF 30-35% dx in 2011 -> most recent 40-45% by echo 04/2012. b. s/p upgrade to BiV-PPM 05/08/12. c. Med titration limited by hypotension.  . Cluster headache syndrome   . Hypertr obst cardiomyop   . Hypokalemia   . Hypotension   . Paroxysmal atrial fibrillation (HCC)    a. identified on PPM interrogation b. longest episode 1 hour; if burden increases, will need [place]   . Pneumonia 2008     Family History  Problem Relation Age of Onset  . Heart disease Sister   . Dementia Mother   . Cancer Sister 24       uterine  . Diabetes Unknown   . Cardiomyopathy Sister   . Diabetes Brother   . Colon cancer Neg Hx   . Esophageal cancer Neg Hx   . Stomach cancer Neg Hx   . Rectal cancer Neg Hx      Social History   Socioeconomic History  . Marital status: Married  Spouse name: Not on file  . Number of children: 2  . Years of education: Not on file  . Highest education level: Not on file  Occupational History  . Occupation: [employer]    Employer: [employer]  . Financial resource strain: Not on file  . Food insecurity:    Worry: Not on file    Inability: Not on file  . Transportation needs:    Medical: Not on file    Non-medical:  Not on file  Tobacco Use  . Smoking status: Former Smoker    Packs/day: 3.00  . Smokeless tobacco: Never Used  Substance and Sexual Activity  . Alcohol use: Yes    Alcohol/week: 1.2 oz    Types: 2 Glasses of wine per week  . Drug use: No  . Sexual activity: Never  Lifestyle  . Physical activity:    Days per week: Not on file    Minutes per session: Not on file  . Stress: Not on file  Relationships  . Social connections:    Talks on phone: Not on file    Gets together: Not on file    Attends religious service: Not on file    Active member of club or organization: Not on file    Attends meetings of clubs or organizations: Not on file    Relationship status: Not on file  . Intimate partner violence:    Fear of current or ex partner: Not on file    Emotionally abused: Not on file    Physically abused: Not on file    Forced sexual activity: Not on file  Other Topics Concern  . Not on file  Social History Narrative   ** Merged History Encounter **         Allergies  Allergen Reactions  . Prednisone Other (See Comments)    SOB, CHF      Outpatient Medications Prior to Visit  Medication Sig Dispense Refill  . albuterol (PROVENTIL HFA;VENTOLIN HFA) 108 (90 Base) MCG/ACT inhaler Inhale 1-2 puffs into the lungs every 6 (six) hours as needed for wheezing or shortness of breath. 1 Inhaler 1  . ALPRAZolam (XANAX) 0.25 MG tablet Take 1 tablet (0.25 mg total) by mouth 2 (two) times daily as needed for anxiety. 30 tablet 3  . AMBULATORY NON FORMULARY MEDICATION Medication Name: 100% O2 15 leters a min for 15-20 mins  Via Nasal cannual 1 Bottle 2  . amitriptyline (ELAVIL) 10 MG tablet Take 10 mg by mouth.    Marland Kitchen apixaban (ELIQUIS) 5 MG TABS tablet Take 1 tablet (5 mg total) by mouth 2 (two) times daily. 60 tablet 3  . aspirin 81 MG chewable tablet Chew 81 mg by mouth daily.    . carvedilol (COREG) 12.5 MG tablet TAKE 1 TABLET (12.5 MG TOTAL) BY MOUTH 2 (TWO) TIMES DAILY WITH A MEAL. 60  tablet 3  . cyclobenzaprine (FLEXERIL) 5 MG tablet TAKE 1 TABLET (5 MG TOTAL) BY MOUTH 3 (THREE) TIMES DAILY AS NEEDED FOR MUSCLE SPASMS. 30 tablet 2  . diclofenac sodium (VOLTAREN) 1 % GEL Apply 2 g topically 4 (four) times daily. 3 Tube 0  . FLUoxetine (PROZAC) 20 MG capsule Take 1 capsule (20 mg total) by mouth daily. 30 capsule 3  . fluticasone (FLONASE) 50 MCG/ACT nasal spray PLACE 2 SPRAYS INTO BOTH NOSTRILS DAILY. 16 g 5  . ipratropium-albuterol (DUONEB) 0.5-2.5 (3) MG/3ML SOLN USE 1 VIAL (3ML) BY NEBULIZER EVERY 4  TO 6 HOURS AS NEEDED 90 mL 0  . loratadine (CLARITIN) 10 MG tablet Take 1 tablet (10 mg total) by mouth daily. 30 tablet 5  . losartan (COZAAR) 25 MG tablet Take 1 tablet (25 mg total) by mouth daily. 30 tablet 3  . nicotine (NICODERM CQ - DOSED IN MG/24 HOURS) 14 mg/24hr patch Place 1 patch (14 mg total) onto the skin daily. 28 patch 0  . potassium chloride SA (K-DUR,KLOR-CON) 20 MEQ tablet Take 1 tablet (20 mEq total) by mouth daily. 90 tablet 3  . Spacer/Aero-Holding Chambers (AEROCHAMBER MV) inhaler Use as instructed with Symbicort 1 each 0  . spironolactone (ALDACTONE) 25 MG tablet Take 1 tablet (25 mg total) by mouth daily. 30 tablet 11  . SUMAtriptan (IMITREX) 50 MG tablet TAKE 1 TABLET AT ONSET OF HEADACHE. MAY REPEAT IN 2 HOURS IF HEADACHE PERSISTS OR RECURS. 10 tablet 6  . SYMBICORT 160-4.5 MCG/ACT inhaler INHALE 2 PUFFS BY MOUTH INTO THE LUNGS 2 TIMES A DAY 10.2 g 3  . topiramate (TOPAMAX) 50 MG tablet Take 50 mg by mouth daily.    Marland Kitchen torsemide (DEMADEX) 20 MG tablet Take 1 tablet (20 mg total) by mouth 2 (two) times daily. 60 tablet 6   No facility-administered medications prior to visit.          Objective:   Physical Exam Vitals:   04/12/17 1335  BP: 118/78  Pulse: 73  SpO2: 96%  Weight: 132 lb (59.9 kg)  Height: 5\' 2"  (1.575 m)   Gen: Pleasant, well-nourished, in no distress,  normal affect  ENT: No lesions,  mouth clear,  oropharynx clear, no nasal  congestion  Neck: No JVD, no stridor  Lungs: No use of accessory muscles,  B insp and exp scattered wheeze.   Cardiovascular: RRR, heart sounds normal, no murmur or gallops, no peripheral edema  Musculoskeletal: No deformities, no cyanosis or clubbing  Neuro: alert, non focal  Skin: Warm, no lesions or rashes      Assessment & Plan:  COPD (chronic obstructive pulmonary disease) (HCC) Unclear to me how much of her acute episodes of dyspnea that have caused her to be admitted to the hospital or actually related to obstructive lung disease.  She is used her albuterol at times without resolution of symptoms.  In fact she has been told at the hospital that she should avoid albuterol in the situation since is a stimulant and may contribute to her anxiety.  I have to disagree with this instruction and have told her so.  I want her to have it available in case she gets short windedness as there is no way for her to be able to discern whether she is having true bronchospasm at those times.  If it turns out that she is never responsive to albuterol in the situations and we may decide to modify each use.  Baltazar Apo, MD, PhD 04/12/2017, 2:01 PM [facility] [phone] or if no answer [phone]

## 2017-04-12 NOTE — Patient Instructions (Signed)
Please continue Symbicort 2 puffs twice a day as you have been taking it.  Remember to rinse and gargle after using. Keep your albuterol available to use 2 puffs in the event of shortness of breath or chest tightness. Continue diuretic medications as directed by the advanced heart failure clinic. Continue her anxiety medicines as directed by Dr. Birdie Riddle Follow with Dr Lamonte Sakai in 6 months or sooner if you have any problems

## 2017-04-12 NOTE — Assessment & Plan Note (Signed)
Unclear to me how much of her acute episodes of dyspnea that have caused her to be admitted to the hospital or actually related to obstructive lung disease.  She is used her albuterol at times without resolution of symptoms.  In fact she has been told at the hospital that she should avoid albuterol in the situation since is a stimulant and may contribute to her anxiety.  I have to disagree with this instruction and have told her so.  I want her to have it available in case she gets short windedness as there is no way for her to be able to discern whether she is having true bronchospasm at those times.  If it turns out that she is never responsive to albuterol in the situations and we may decide to modify each use.

## 2017-04-15 ENCOUNTER — Ambulatory Visit (INDEPENDENT_AMBULATORY_CARE_PROVIDER_SITE_OTHER): Payer: 59 | Admitting: *Deleted

## 2017-04-15 DIAGNOSIS — I420 Dilated cardiomyopathy: Secondary | ICD-10-CM

## 2017-04-16 NOTE — Progress Notes (Signed)
Remote pacemaker transmission.   

## 2017-04-17 ENCOUNTER — Encounter: Payer: Self-pay | Admitting: Cardiology

## 2017-04-17 ENCOUNTER — Encounter (HOSPITAL_COMMUNITY): Payer: Self-pay | Admitting: *Deleted

## 2017-04-17 NOTE — Progress Notes (Signed)
Received FMLA forms from [employer].  Forms completed/signed and faxed today to [phone].  Original forms will be scanned to patient's electronic medical record.

## 2017-04-24 ENCOUNTER — Telehealth: Payer: Self-pay | Admitting: Emergency Medicine

## 2017-04-29 NOTE — Telephone Encounter (Signed)
Dr. Byrum is back in the office on 05/06/17. Forms will be addressed then. 

## 2017-04-30 MED FILL — FLUoxetine HCL 20 MG CAPS: 20 | 30 days supply | Qty: 30 | Fill #1

## 2017-04-30 MED FILL — TORSEMIDE 10 MG TABS: 10 | 30 days supply | Qty: 120 | Fill #1

## 2017-05-02 ENCOUNTER — Telehealth: Payer: Self-pay | Admitting: General Practice

## 2017-05-02 NOTE — Telephone Encounter (Signed)
Called pt and left a message to advise that she is overdue for her Prolia injection. Ok to schedule a nurse visit at her convenience in [place].   [place] for [facility] to Discuss results / PCP recommendations / Schedule patient.   CRM placed.

## 2017-05-06 ENCOUNTER — Ambulatory Visit (INDEPENDENT_AMBULATORY_CARE_PROVIDER_SITE_OTHER): Payer: 59 | Admitting: Neurology

## 2017-05-06 ENCOUNTER — Other Ambulatory Visit (HOSPITAL_COMMUNITY): Payer: Self-pay | Admitting: Internal Medicine

## 2017-05-06 ENCOUNTER — Encounter: Payer: Self-pay | Admitting: Neurology

## 2017-05-06 VITALS — BP 92/58 | HR 70 | Ht 62.0 in | Wt 128.0 lb

## 2017-05-06 DIAGNOSIS — G44019 Episodic cluster headache, not intractable: Secondary | ICD-10-CM | POA: Diagnosis not present

## 2017-05-06 NOTE — Telephone Encounter (Signed)
Called pt and LMOVM to return call.  °

## 2017-05-06 NOTE — Patient Instructions (Addendum)
Continue oxygen or sumatriptan as needed Continue topiramate 50mg  at bedtime Follow up in one year

## 2017-05-06 NOTE — Telephone Encounter (Signed)
Forms have been given to Dr. Lamonte Sakai. Will update chart once these are returned to me.

## 2017-05-06 NOTE — Progress Notes (Signed)
NEUROLOGY FOLLOW UP OFFICE NOTE  Angela Shepherd [id number]  HISTORY OF PRESENT ILLNESS: Angela Shepherd is a 67 year old right-handed woman with systolic CHF, hypertension who follows up for cluster headache.   UPDATE: 1 to 2 headaches a month in the Fall and Spring.  Moderate intensity but aborts quickly with O2 (rarely sumatriptan as second line) before it increases. Current abortive therapy:  O2, sumatriptan (second line) Current antidepressant:  amitriptyline 10mg  (as needed for sleep) Current anticonvulsant:  topiramate 50mg    HISTORY: Onset:  In her 30s Location:  Circumscribed area in right frontal/temporal region Quality:  shooting Initial intensity:  10/10 Aura:  no Prodrome:  no Associated symptoms:  Nose runs, right eye lacrimation, photophobia, phonophobia.  Rarely, nausea.  No unilateral numbness or weakness. Initial Duration:  1 hour at a time, occuring once to 3 times back to back. Initial Frequency:  Episodic, usually during months of April, May or October.  Occurs daily between 1 to 4 weeks. Triggers/exacerbating factors:  none Relieving factors:  none Activity:  Cannot function   Past abortive medication:  Prednisone taper (previously effective but cannot take due to CHF), Tylenol Migraine, sumatriptan 50mg  Past preventative medication:  amitriptyline Other past therapy:  none   Caffeine:  1 cup of coffee and 1 cup of tea daily Alcohol:  no Smoker:  former Diet:  Healthy.  On fluid restriction Exercise:  Walks on treadmill daily Depression/stress:  Fairly stable.  Husband passed away last year.  Primary caregiver now for her elderly mother. Sleep hygiene:  poor Family history of headache:  Paternal grandfather (said he had "lightening bolts in my head"), mom Works in Audiological scientist.    PAST MEDICAL HISTORY: Past Medical History:  Diagnosis Date  . Bradycardia    a. initial PPM placed 1994 at [facility] with gen change 2002. b. 1*AVB & intermittent  2nd degree AVB + CHF --> upgrade to [facility]. Jude BiV PPM 05/08/12.  . Chronic systolic CHF (congestive heart failure) ([place])    a. EF 30-35% dx in 2011 -> most recent 40-45% by echo 04/2012. b. s/p upgrade to BiV-PPM 05/08/12. c. Med titration limited by hypotension.  . Cluster headache syndrome   . Hypertr obst cardiomyop   . Hypokalemia   . Hypotension   . Paroxysmal atrial fibrillation (HCC)    a. identified on PPM interrogation b. longest episode 1 hour; if burden increases, will need [place]   . Pneumonia 2008    MEDICATIONS: Current Outpatient Medications on File Prior to Visit  Medication Sig Dispense Refill  . albuterol (PROVENTIL HFA;VENTOLIN HFA) 108 (90 Base) MCG/ACT inhaler Inhale 1-2 puffs into the lungs every 6 (six) hours as needed for wheezing or shortness of breath. 1 Inhaler 1  . ALPRAZolam (XANAX) 0.25 MG tablet Take 1 tablet (0.25 mg total) by mouth 2 (two) times daily as needed for anxiety. 30 tablet 3  . AMBULATORY NON FORMULARY MEDICATION Medication Name: 100% O2 15 leters a min for 15-20 mins  Via Nasal cannual 1 Bottle 2  . amitriptyline (ELAVIL) 10 MG tablet Take 10 mg by mouth.    Marland Kitchen apixaban (ELIQUIS) 5 MG TABS tablet Take 1 tablet (5 mg total) by mouth 2 (two) times daily. 60 tablet 3  . aspirin 81 MG chewable tablet Chew 81 mg by mouth daily.    . carvedilol (COREG) 12.5 MG tablet TAKE 1 TABLET (12.5 MG TOTAL) BY MOUTH 2 (TWO) TIMES DAILY WITH A MEAL. 60 tablet 3  .  cyclobenzaprine (FLEXERIL) 5 MG tablet TAKE 1 TABLET (5 MG TOTAL) BY MOUTH 3 (THREE) TIMES DAILY AS NEEDED FOR MUSCLE SPASMS. 30 tablet 2  . diclofenac sodium (VOLTAREN) 1 % GEL Apply 2 g topically 4 (four) times daily. 3 Tube 0  . FLUoxetine (PROZAC) 20 MG capsule Take 1 capsule (20 mg total) by mouth daily. 30 capsule 3  . fluticasone (FLONASE) 50 MCG/ACT nasal spray PLACE 2 SPRAYS INTO BOTH NOSTRILS DAILY. 16 g 5  . ipratropium-albuterol (DUONEB) 0.5-2.5 (3) MG/3ML SOLN USE 1 VIAL (3ML) BY NEBULIZER EVERY 4 TO  6 HOURS AS NEEDED 90 mL 0  . loratadine (CLARITIN) 10 MG tablet Take 1 tablet (10 mg total) by mouth daily. 30 tablet 5  . losartan (COZAAR) 25 MG tablet Take 1 tablet (25 mg total) by mouth daily. 30 tablet 3  . nicotine (NICODERM CQ - DOSED IN MG/24 HOURS) 14 mg/24hr patch Place 1 patch (14 mg total) onto the skin daily. 28 patch 0  . potassium chloride SA (K-DUR,KLOR-CON) 20 MEQ tablet Take 1 tablet (20 mEq total) by mouth daily. 90 tablet 3  . Spacer/Aero-Holding Chambers (AEROCHAMBER MV) inhaler Use as instructed with Symbicort 1 each 0  . spironolactone (ALDACTONE) 25 MG tablet Take 1 tablet (25 mg total) by mouth daily. 30 tablet 11  . SUMAtriptan (IMITREX) 50 MG tablet TAKE 1 TABLET AT ONSET OF HEADACHE. MAY REPEAT IN 2 HOURS IF HEADACHE PERSISTS OR RECURS. 10 tablet 6  . SYMBICORT 160-4.5 MCG/ACT inhaler INHALE 2 PUFFS BY MOUTH INTO THE LUNGS 2 TIMES A DAY 10.2 g 3  . topiramate (TOPAMAX) 50 MG tablet Take 50 mg by mouth daily.    Marland Kitchen torsemide (DEMADEX) 20 MG tablet Take 1 tablet (20 mg total) by mouth 2 (two) times daily. 60 tablet 6   No current facility-administered medications on file prior to visit.     ALLERGIES: Allergies  Allergen Reactions  . Prednisone Other (See Comments)    SOB, CHF     FAMILY HISTORY: Family History  Problem Relation Age of Onset  . Heart disease Sister   . Dementia Mother   . Cancer Sister 30       uterine  . Diabetes Unknown   . Cardiomyopathy Sister   . Diabetes Brother   . Colon cancer Neg Hx   . Esophageal cancer Neg Hx   . Stomach cancer Neg Hx   . Rectal cancer Neg Hx     SOCIAL HISTORY: Social History   Socioeconomic History  . Marital status: Married    Spouse name: Not on file  . Number of children: 2  . Years of education: Not on file  . Highest education level: Not on file  Occupational History  . Occupation: [employer]    Employer: [employer]  . Financial resource strain: Not on file    . Food insecurity:    Worry: Not on file    Inability: Not on file  . Transportation needs:    Medical: Not on file    Non-medical: Not on file  Tobacco Use  . Smoking status: Former Smoker    Packs/day: 3.00  . Smokeless tobacco: Never Used  Substance and Sexual Activity  . Alcohol use: Yes    Alcohol/week: 1.2 oz    Types: 2 Glasses of wine per week  . Drug use: No  . Sexual activity: Never  Lifestyle  . Physical activity:    Days per week:  Not on file    Minutes per session: Not on file  . Stress: Not on file  Relationships  . Social connections:    Talks on phone: Not on file    Gets together: Not on file    Attends religious service: Not on file    Active member of club or organization: Not on file    Attends meetings of clubs or organizations: Not on file    Relationship status: Not on file  . Intimate partner violence:    Fear of current or ex partner: Not on file    Emotionally abused: Not on file    Physically abused: Not on file    Forced sexual activity: Not on file  Other Topics Concern  . Not on file  Social History Narrative   ** Merged History Encounter **        REVIEW OF SYSTEMS: Constitutional: No fevers, chills, or sweats, no generalized fatigue, change in appetite Eyes: No visual changes, double vision, eye pain Ear, nose and throat: No hearing loss, ear pain, nasal congestion, sore throat Cardiovascular: No chest pain, palpitations Respiratory:  No shortness of breath at rest or with exertion, wheezes GastrointestinaI: No nausea, vomiting, diarrhea, abdominal pain, fecal incontinence Genitourinary:  No dysuria, urinary retention or frequency Musculoskeletal:  No neck pain, back pain Integumentary: No rash, pruritus, skin lesions Neurological: as above Psychiatric: No depression, insomnia, anxiety Endocrine: No palpitations, fatigue, diaphoresis, mood swings, change in appetite, change in weight, increased thirst Hematologic/Lymphatic:  No  purpura, petechiae. Allergic/Immunologic: no itchy/runny eyes, nasal congestion, recent allergic reactions, rashes  PHYSICAL EXAM: Vitals:   05/06/17 1401  BP: (!) 92/58  Pulse: 70  SpO2: 99%   General: No acute distress.  Patient appears well-groomed.   Head:  Normocephalic/atraumatic Eyes:  Fundi examined but not visualized Neck: supple, no paraspinal tenderness, full range of motion Heart:  Regular rate and rhythm Lungs:  Clear to auscultation bilaterally Back: No paraspinal tenderness Neurological Exam: alert and oriented to person, place, and time. Attention span and concentration intact, recent and remote memory intact, fund of knowledge intact.  Speech fluent and not dysarthric, language intact.  CN II-XII intact. Bulk and tone normal, muscle strength 5/5 throughout.  Sensation to light touch  intact.  Deep tendon reflexes 2+ throughout.  Finger to nose testing intact.  Gait normal, Romberg negative.  IMPRESSION: Episodic cluster headache, stable  PLAN: 1.  Preventative: Topiramate 50mg  at bedtime 2.  Abortive:  O2 first line, sumatriptan second line  Metta Clines, DO  CC:  Dr. Birdie Riddle

## 2017-05-08 LAB — CUP PACEART REMOTE DEVICE CHECK
Battery Remaining Longevity: 60 mo
Battery Remaining Percentage: 73 %
Brady Statistic AP VS Percent: 1 %
Brady Statistic AS VS Percent: 1 %
Brady Statistic RA Percent Paced: 52 %
Date Time Interrogation Session: 20190408094818
Implantable Lead Implant Date: 19940705
Implantable Lead Location: 753859
Implantable Lead Model: 4024
Implantable Pulse Generator Implant Date: 20140501
Lead Channel Impedance Value: 460 Ohm
Lead Channel Pacing Threshold Amplitude: 0.75 V
Lead Channel Pacing Threshold Amplitude: 1 V
Lead Channel Pacing Threshold Pulse Width: 0.4 ms
Lead Channel Pacing Threshold Pulse Width: 1 ms
Lead Channel Sensing Intrinsic Amplitude: 2.3 mV
Lead Channel Setting Pacing Amplitude: 2 V
Lead Channel Setting Pacing Amplitude: 2 V
Lead Channel Setting Pacing Amplitude: 2.5 V
Lead Channel Setting Pacing Pulse Width: 0.4 ms
MDC IDC LEAD IMPLANT DT: 19940705
MDC IDC LEAD IMPLANT DT: 20140501
MDC IDC LEAD LOCATION: 753858
MDC IDC LEAD LOCATION: 753860
MDC IDC MSMT BATTERY VOLTAGE: 2.92 V
MDC IDC MSMT LEADCHNL LV IMPEDANCE VALUE: 580 Ohm
MDC IDC MSMT LEADCHNL RA PACING THRESHOLD AMPLITUDE: 1 V
MDC IDC MSMT LEADCHNL RV IMPEDANCE VALUE: 380 Ohm
MDC IDC MSMT LEADCHNL RV PACING THRESHOLD PULSEWIDTH: 0.4 ms
MDC IDC MSMT LEADCHNL RV SENSING INTR AMPL: 5.1 mV
MDC IDC SET LEADCHNL LV PACING PULSEWIDTH: 1 ms
MDC IDC SET LEADCHNL RV SENSING SENSITIVITY: 2 mV
MDC IDC STAT BRADY AP VP PERCENT: 57 %
MDC IDC STAT BRADY AS VP PERCENT: 43 %
Pulse Gen Model: 3210
Pulse Gen Serial Number: 2936617

## 2017-05-08 MED FILL — CARVEDILOL 12.5 MG TABLET: 12.5 | 30 days supply | Qty: 60 | Fill #0

## 2017-05-08 NOTE — Telephone Encounter (Signed)
Called pt again. Closing encounter until she returns call.

## 2017-05-10 NOTE — Telephone Encounter (Signed)
Forms have been completed. Will give them to the up front staff to send back to [facility].

## 2017-05-15 MED FILL — SPIRONOLACTONE 25 MG TABS: 25 | 30 days supply | Qty: 30 | Fill #2

## 2017-05-15 MED FILL — ELIQUIS 5 MG TABLET: 5 | 30 days supply | Qty: 60 | Fill #2

## 2017-05-17 ENCOUNTER — Ambulatory Visit (HOSPITAL_COMMUNITY)
Admission: RE | Admit: 2017-05-17 | Discharge: 2017-05-17 | Disposition: A | Discharge status: Home / Self Care | Payer: 59 | Source: Ambulatory Visit | Attending: Internal Medicine | Admitting: Internal Medicine

## 2017-05-17 ENCOUNTER — Encounter (HOSPITAL_COMMUNITY): Payer: Self-pay | Admitting: Internal Medicine

## 2017-05-17 ENCOUNTER — Encounter: Payer: Self-pay | Admitting: General Practice

## 2017-05-17 ENCOUNTER — Other Ambulatory Visit: Payer: Self-pay

## 2017-05-17 VITALS — BP 113/69 | HR 65 | Wt 126.2 lb

## 2017-05-17 DIAGNOSIS — R0600 Dyspnea, unspecified: Secondary | ICD-10-CM

## 2017-05-17 DIAGNOSIS — I5022 Chronic systolic (congestive) heart failure: Secondary | ICD-10-CM | POA: Diagnosis not present

## 2017-05-17 DIAGNOSIS — I110 Hypertensive heart disease with heart failure: Secondary | ICD-10-CM | POA: Diagnosis not present

## 2017-05-17 DIAGNOSIS — Z87891 Personal history of nicotine dependence: Secondary | ICD-10-CM | POA: Diagnosis not present

## 2017-05-17 DIAGNOSIS — R0609 Other forms of dyspnea: Secondary | ICD-10-CM | POA: Diagnosis not present

## 2017-05-17 DIAGNOSIS — Z7951 Long term (current) use of inhaled steroids: Secondary | ICD-10-CM | POA: Insufficient documentation

## 2017-05-17 DIAGNOSIS — Z7982 Long term (current) use of aspirin: Secondary | ICD-10-CM | POA: Diagnosis not present

## 2017-05-17 DIAGNOSIS — F419 Anxiety disorder, unspecified: Secondary | ICD-10-CM | POA: Diagnosis not present

## 2017-05-17 DIAGNOSIS — I480 Paroxysmal atrial fibrillation: Secondary | ICD-10-CM | POA: Insufficient documentation

## 2017-05-17 DIAGNOSIS — I421 Obstructive hypertrophic cardiomyopathy: Secondary | ICD-10-CM | POA: Diagnosis not present

## 2017-05-17 DIAGNOSIS — Z950 Presence of cardiac pacemaker: Secondary | ICD-10-CM | POA: Insufficient documentation

## 2017-05-17 DIAGNOSIS — Z79899 Other long term (current) drug therapy: Secondary | ICD-10-CM | POA: Insufficient documentation

## 2017-05-17 LAB — BASIC METABOLIC PANEL
ANION GAP: 9 (ref 5–15)
BUN: 20 mg/dL (ref 6–20)
CO2: 28 mmol/L (ref 22–32)
Calcium: 9.2 mg/dL (ref 8.9–10.3)
Chloride: 104 mmol/L (ref 101–111)
Creatinine, Ser: 1.17 mg/dL — ABNORMAL HIGH (ref 0.44–1.00)
GFR calc Af Amer: 55 mL/min — ABNORMAL LOW (ref >60)
GFR calc non Af Amer: 48 mL/min — ABNORMAL LOW (ref >60)
GLUCOSE: 94 mg/dL (ref 65–99)
POTASSIUM: 3.9 mmol/L (ref 3.5–5.1)
Sodium: 141 mmol/L (ref 135–145)

## 2017-05-17 LAB — BRAIN NATRIURETIC PEPTIDE: B Natriuretic Peptide: 772.8 pg/mL — ABNORMAL HIGH (ref 0.0–100.0)

## 2017-05-17 MED ORDER — LOSARTAN POTASSIUM 25 MG PO TABS: 25.0000 mg | ORAL_TABLET | Freq: Two times a day (BID) | ORAL | 6 refills | Status: DC

## 2017-05-17 MED FILL — LOSARTAN POTASSIUM 25 MG TA: 25 | 30 days supply | Qty: 60 | Fill #0

## 2017-05-17 NOTE — Addendum Note (Signed)
Encounter addended by: Scarlette Calico, RN on: 05/17/2017 2:27 PM  Actions taken: Diagnosis association updated, Pharmacy for encounter modified, Order list changed, Sign clinical note

## 2017-05-17 NOTE — Progress Notes (Signed)
Advanced Heart Failure Clinic Note   Patient ID: Angela Shepherd, female   DOB: 03-19-1950, 67 y.o.   MRN: [id number] PCP: Dr. Birdie Riddle  HPI: Angela Shepherd is a 67 y.o. 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 She has a h/o HOCM, bradycardia s/p PPM in the 90s at [facility]. PMHx also notable for HTN and prior tobacco abuse (quit 02/2010).   Was admitted in December 2011 with acute HF. Echo with EF 30-35%, There was also evidence of ASH with septum of 1.7 PW = 1.9. Diuresed and scheduled for outpt cath.  Cath 12/2009: Normal coronaries. EF 40-45%.  Central aortic pressure was 86/47 with a mean of 62.  LV pressure is 74/13 with an EDP of 2.  Right atrial pressure mean of 3.  RV pressure 26/3 with an EDP of 5.  PA pressure 28/5 with a mean of 16.  Pulmonary capillary wedge pressure mean of 7.  Fick cardiac output was 3.4 and cardiac index 2.1.   Echo in March 2012:  EF 50-55%. Grade 2 diastolic dysfunction. IVS 1.4cm.  Echo 4/14: EF 40-45%  ECHO 09/03/13: EF 40-45% Echo 4/16: EF 45-50%  Was seen by EP and noted to have a high percentage of RV pacing (99%) and it was felt this was worsening LV function. Underwent upgrade to [facility]. Jude CRT-P in 5/14.  Echo in 4/16 showed EF 45-50% with mild LVH.   Pt admitted to [facility] 08/2016 for CHF. Required BiPAP on admission but quickly weaned off O2. Pt denied medical or dietary non-compliance. Was told about Cardiomems device which she would like to pursue if she is a candidate.   Pt states she was admitted to [facility] again last week (09/20/16) and diuresed 8 lbs. No changes were made to her chronic medications.   Admitted in January to [facility] for volume overload (BNP 1400), and then in February for volume depletion requiring IVF. She states she was discharged 8 lbs up from her baseline weight.   She presents today for regular follow up. At last visit, spiro increased to 25 daily. Feeling much better. She is very happy that she hasn't had  to go to the hospital in over 3 months. Weight much more stable 123-124. Less ups and downs. Breathing better. Walking on TM regularly. Saw Dr. Lamonte Sakai who felt episodes were a combination of flash edema and anxiety. 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  Echo 19.19 at [place] EF 35-40%  Echo 08/22/16 At [facility]: LVEF 40-45%, mild MR, Mild TR  SH: Nonsmoker, works at [employer], lives in [place].   Review of systems complete and found to be negative unless listed in HPI.    Past Medical History:  Diagnosis Date  . Bradycardia    a. initial PPM placed 1994 at [facility] with gen change 2002. b. 1*AVB & intermittent 2nd degree AVB + CHF --> upgrade to [facility]. Jude BiV PPM 05/08/12.  . Chronic systolic CHF (congestive heart failure) ([place])    a. EF 30-35% dx in 2011 -> most recent 40-45% by echo 04/2012. b. s/p upgrade to BiV-PPM 05/08/12. c. Med titration limited by hypotension.  . Cluster headache syndrome   . Hypertr obst cardiomyop   . Hypokalemia   . Hypotension   . Paroxysmal atrial fibrillation (HCC)    a. identified on PPM interrogation b. longest episode 1 hour; if burden increases, will need [place]   . Pneumonia 2008  Current Outpatient Medications  Medication Sig Dispense Refill  . albuterol (PROVENTIL HFA;VENTOLIN HFA) 108 (90 Base) MCG/ACT inhaler Inhale 1-2 puffs into the lungs every 6 (six) hours as needed for wheezing or shortness of breath. 1 Inhaler 1  . ALPRAZolam (XANAX) 0.25 MG tablet Take 1 tablet (0.25 mg total) by mouth 2 (two) times daily as needed for anxiety. 30 tablet 3  . AMBULATORY NON FORMULARY MEDICATION Medication Name: 100% O2 15 leters a min for 15-20 mins  Via Nasal cannual 1 Bottle 2  . amitriptyline (ELAVIL) 10 MG tablet Take 10 mg by mouth.    Marland Kitchen apixaban (ELIQUIS) 5 MG TABS tablet Take 1 tablet (5 mg total) by mouth 2 (two) times daily. 60 tablet 3  . aspirin 81 MG chewable tablet Chew 81 mg by mouth daily.    . carvedilol (COREG)  12.5 MG tablet TAKE 1 TABLET BY MOUTH 2 TIMES DAILY WITH A MEAL. 60 tablet 3  . cyclobenzaprine (FLEXERIL) 5 MG tablet TAKE 1 TABLET (5 MG TOTAL) BY MOUTH 3 (THREE) TIMES DAILY AS NEEDED FOR MUSCLE SPASMS. 30 tablet 2  . diclofenac sodium (VOLTAREN) 1 % GEL Apply 2 g topically 4 (four) times daily. 3 Tube 0  . FLUoxetine (PROZAC) 20 MG capsule Take 1 capsule (20 mg total) by mouth daily. 30 capsule 3  . fluticasone (FLONASE) 50 MCG/ACT nasal spray PLACE 2 SPRAYS INTO BOTH NOSTRILS DAILY. 16 g 5  . ipratropium-albuterol (DUONEB) 0.5-2.5 (3) MG/3ML SOLN USE 1 VIAL (3ML) BY NEBULIZER EVERY 4 TO 6 HOURS AS NEEDED 90 mL 0  . loratadine (CLARITIN) 10 MG tablet Take 1 tablet (10 mg total) by mouth daily. 30 tablet 5  . losartan (COZAAR) 25 MG tablet Take 1 tablet (25 mg total) by mouth daily. 30 tablet 3  . nicotine (NICODERM CQ - DOSED IN MG/24 HOURS) 14 mg/24hr patch Place 1 patch (14 mg total) onto the skin daily. 28 patch 0  . potassium chloride SA (K-DUR,KLOR-CON) 20 MEQ tablet Take 1 tablet (20 mEq total) by mouth daily. 90 tablet 3  . Spacer/Aero-Holding Chambers (AEROCHAMBER MV) inhaler Use as instructed with Symbicort 1 each 0  . spironolactone (ALDACTONE) 25 MG tablet Take 1 tablet (25 mg total) by mouth daily. 30 tablet 11  . SUMAtriptan (IMITREX) 50 MG tablet TAKE 1 TABLET AT ONSET OF HEADACHE. MAY REPEAT IN 2 HOURS IF HEADACHE PERSISTS OR RECURS. 10 tablet 6  . SYMBICORT 160-4.5 MCG/ACT inhaler INHALE 2 PUFFS BY MOUTH INTO THE LUNGS 2 TIMES A DAY 10.2 g 3  . topiramate (TOPAMAX) 50 MG tablet Take 50 mg by mouth daily.    Marland Kitchen torsemide (DEMADEX) 20 MG tablet Take 1 tablet (20 mg total) by mouth 2 (two) times daily. 60 tablet 6   No current facility-administered medications for this encounter.    PHYSICAL EXAM: Vitals:   05/17/17 1405  BP: 113/69  Pulse: 65  SpO2: 100%  Weight: 126 lb 4 oz (57.3 kg)    Wt Readings from Last 3 Encounters:  05/17/17 126 lb 4 oz (57.3 kg)  05/06/17 128  lb (58.1 kg)  04/12/17 132 lb (59.9 kg)    Physical Exam.  General:  Well appearing. No resp difficulty HEENT: normal Neck: supple. no JVD. Carotids 2+ bilat; no bruits. No lymphadenopathy or thryomegaly appreciated. Cor: PMI nondisplaced. Regular rate & rhythm. No rubs, gallops or murmurs. Lungs: clear Abdomen: soft, nontender, nondistended. No hepatosplenomegaly. No bruits or masses. Good bowel sounds. Extremities: no cyanosis,  clubbing, rash, edema Neuro: alert & orientedx3, cranial nerves grossly intact. moves all 4 extremities w/o difficulty. Affect pleasant   ASSESSMENT & PLAN: 1. Chronic systolic CHF: EF 28% s/p CRT-P upgrade 05/2012 (due to chronic RV pacing)  - Echo 1/19 At [facility]: LVEF 35-40%, mild MR, Mild TR - NYHA II - Volume status improved on increased dose of spiro - Continue torsemide 20 mg BID. - Continue Coreg 12.5mg  BID.  Has been intolerant to up-titration.  -  Continue spiro 25 mg daily. BMET today - Continue losartan 25 mg daily. We discussed switching to [facility] but BP is a bit soft and she is feeling well so will start with increasing losartan 25 bid - Reinforced fluid restriction to < 2 L daily, sodium restriction to less than 2000 mg daily, and the importance of daily weights.   2. H/o HOCM  - Stable on most recent Echo.  - Has been seen in [facility] at [facility]. All 1st degree relatives have been tested 3. HTN - Stable on current meds.  4. Tobacco use quit 2012 - No further.  - PFTs relatively stable 06/2016. 5. Atypical chest pain - No further. Suspect GI etiology - Asked pt to follow up with any repeat chest pain.  6. Anxiety - improved. Per PCP   Angela Bickers, MD  05/17/2017

## 2017-05-17 NOTE — Patient Instructions (Signed)
Increase Losartan to 25 mg Twice daily   Labs today  Your physician recommends that you schedule a follow-up appointment in: 4 months

## 2017-05-29 MED FILL — TORSEMIDE 10 MG TABS: 10 | 30 days supply | Qty: 120 | Fill #2

## 2017-05-29 MED FILL — FUROSEMIDE 80 MG TAB: 80 | 16 days supply | Qty: 32 | Fill #3

## 2017-05-29 MED FILL — FLUoxetine HCL 20 MG CAPS: 20 | 30 days supply | Qty: 30 | Fill #2

## 2017-06-04 MED FILL — CARVEDILOL 12.5 MG TABLET: 12.5 | 30 days supply | Qty: 60 | Fill #1

## 2017-06-07 MED FILL — SYMBICORT 160-4.5 MCG INH: 160-4.5 | 30 days supply | Qty: 10 | Fill #1

## 2017-06-07 MED FILL — VENTOLIN HFA 90 MCG INHALER: 108 (90 BAS | 25 days supply | Qty: 18 | Fill #1

## 2017-06-12 ENCOUNTER — Encounter: Payer: Self-pay | Admitting: Physician Assistant

## 2017-06-12 ENCOUNTER — Other Ambulatory Visit: Payer: Self-pay

## 2017-06-12 ENCOUNTER — Ambulatory Visit (INDEPENDENT_AMBULATORY_CARE_PROVIDER_SITE_OTHER): Payer: 59 | Admitting: Physician Assistant

## 2017-06-12 VITALS — BP 124/80 | HR 63 | Temp 97.6°F | Resp 14 | Ht 61.0 in | Wt 125.0 lb

## 2017-06-12 DIAGNOSIS — I480 Paroxysmal atrial fibrillation: Secondary | ICD-10-CM

## 2017-06-12 DIAGNOSIS — M810 Age-related osteoporosis without current pathological fracture: Secondary | ICD-10-CM | POA: Diagnosis not present

## 2017-06-12 DIAGNOSIS — Z1239 Encounter for other screening for malignant neoplasm of breast: Secondary | ICD-10-CM | POA: Insufficient documentation

## 2017-06-12 DIAGNOSIS — Z1231 Encounter for screening mammogram for malignant neoplasm of breast: Secondary | ICD-10-CM

## 2017-06-12 DIAGNOSIS — N182 Chronic kidney disease, stage 2 (mild): Secondary | ICD-10-CM

## 2017-06-12 DIAGNOSIS — I10 Essential (primary) hypertension: Secondary | ICD-10-CM | POA: Diagnosis not present

## 2017-06-12 DIAGNOSIS — Z0000 Encounter for general adult medical examination without abnormal findings: Secondary | ICD-10-CM | POA: Insufficient documentation

## 2017-06-12 DIAGNOSIS — I420 Dilated cardiomyopathy: Secondary | ICD-10-CM

## 2017-06-12 LAB — COMPREHENSIVE METABOLIC PANEL
ALK PHOS: 78 U/L (ref 39–117)
ALT: 14 U/L (ref 0–35)
AST: 16 U/L (ref 0–37)
Albumin: 4.5 g/dL (ref 3.5–5.2)
BILIRUBIN TOTAL: 0.6 mg/dL (ref 0.2–1.2)
BUN: 22 mg/dL (ref 6–23)
CO2: 29 meq/L (ref 19–32)
Calcium: 9.7 mg/dL (ref 8.4–10.5)
Chloride: 103 mEq/L (ref 96–112)
Creatinine, Ser: 1.3 mg/dL — ABNORMAL HIGH (ref 0.40–1.20)
GFR: 43.61 mL/min — AB (ref >60.00)
Glucose, Bld: 94 mg/dL (ref 70–99)
Potassium: 4.2 mEq/L (ref 3.5–5.1)
Sodium: 139 mEq/L (ref 135–145)
Total Protein: 6.9 g/dL (ref 6.0–8.3)

## 2017-06-12 LAB — LIPID PANEL
CHOL/HDL RATIO: 4
CHOLESTEROL: 198 mg/dL (ref 0–200)
HDL: 55.5 mg/dL (ref >39.00)
LDL Cholesterol: 129 mg/dL — ABNORMAL HIGH (ref 0–99)
NonHDL: 142.07
TRIGLYCERIDES: 63 mg/dL (ref 0.0–149.0)
VLDL: 12.6 mg/dL (ref 0.0–40.0)

## 2017-06-12 LAB — CBC WITH DIFFERENTIAL/PLATELET
BASOS ABS: 0.1 10*3/uL (ref 0.0–0.1)
BASOS PCT: 1.3 % (ref 0.0–3.0)
Eosinophils Absolute: 0.1 10*3/uL (ref 0.0–0.7)
Eosinophils Relative: 2.4 % (ref 0.0–5.0)
HEMATOCRIT: 40 % (ref 36.0–46.0)
Hemoglobin: 13.7 g/dL (ref 12.0–15.0)
LYMPHS PCT: 23.3 % (ref 12.0–46.0)
Lymphs Abs: 1.2 10*3/uL (ref 0.7–4.0)
MCHC: 34.3 g/dL (ref 30.0–36.0)
MCV: 98.4 fl (ref 78.0–100.0)
Monocytes Absolute: 0.4 10*3/uL (ref 0.1–1.0)
Monocytes Relative: 8.4 % (ref 3.0–12.0)
NEUTROS ABS: 3.3 10*3/uL (ref 1.4–7.7)
Neutrophils Relative %: 64.6 % (ref 43.0–77.0)
PLATELETS: 190 10*3/uL (ref 150.0–400.0)
RBC: 4.07 Mil/uL (ref 3.87–5.11)
RDW: 14.7 % (ref 11.5–15.5)
WBC: 5.1 10*3/uL (ref 4.0–10.5)

## 2017-06-12 LAB — HEMOGLOBIN A1C: Hgb A1c MFr Bld: 5.7 % (ref 4.6–6.5)

## 2017-06-12 MED ORDER — DENOSUMAB 60 MG/ML ~~LOC~~ SOSY
60.0000 mg | PREFILLED_SYRINGE | Freq: Once | SUBCUTANEOUS | Status: AC
  Administered 2017-06-12: 60 mg via SUBCUTANEOUS

## 2017-06-12 NOTE — Patient Instructions (Signed)
Please go to the lab for blood work.   Our office will call you with your results unless you have chosen to receive results via MyChart.  If your blood work is normal we will follow-up each year for physicals and as scheduled for chronic medical problems.  If anything is abnormal we will treat accordingly and get you in for a follow-up.  You will be contacted for a screening mammogram. Please have done at your earliest convenience.    Preventive Care 66 Years and Older, Female Preventive care refers to lifestyle choices and visits with your health care provider that can promote health and wellness. What does preventive care include?  A yearly physical exam. This is also called an annual well check.  Dental exams once or twice a year.  Routine eye exams. Ask your health care provider how often you should have your eyes checked.  Personal lifestyle choices, including: ? Daily care of your teeth and gums. ? Regular physical activity. ? Eating a healthy diet. ? Avoiding tobacco and drug use. ? Limiting alcohol use. ? Practicing safe sex. ? Taking low-dose aspirin every day. ? Taking vitamin and mineral supplements as recommended by your health care provider. What happens during an annual well check? The services and screenings done by your health care provider during your annual well check will depend on your age, overall health, lifestyle risk factors, and family history of disease. Counseling Your health care provider may ask you questions about your:  Alcohol use.  Tobacco use.  Drug use.  Emotional well-being.  Home and relationship well-being.  Sexual activity.  Eating habits.  History of falls.  Memory and ability to understand (cognition).  Work and work Statistician.  Reproductive health.  Screening You may have the following tests or measurements:  Height, weight, and BMI.  Blood pressure.  Lipid and cholesterol levels. These may be checked every 5  years, or more frequently if you are over 60 years old.  Skin check.  Lung cancer screening. You may have this screening every year starting at age 99 if you have a 30-pack-year history of smoking and currently smoke or have quit within the past 15 years.  Fecal occult blood test (FOBT) of the stool. You may have this test every year starting at age 59.  Flexible sigmoidoscopy or colonoscopy. You may have a sigmoidoscopy every 5 years or a colonoscopy every 10 years starting at age 60.  Hepatitis C blood test.  Hepatitis B blood test.  Sexually transmitted disease (STD) testing.  Diabetes screening. This is done by checking your blood sugar (glucose) after you have not eaten for a while (fasting). You may have this done every 1-3 years.  Bone density scan. This is done to screen for osteoporosis. You may have this done starting at age 48.  Mammogram. This may be done every 1-2 years. Talk to your health care provider about how often you should have regular mammograms.  Talk with your health care provider about your test results, treatment options, and if necessary, the need for more tests. Vaccines Your health care provider may recommend certain vaccines, such as:  Influenza vaccine. This is recommended every year.  Tetanus, diphtheria, and acellular pertussis (Tdap, Td) vaccine. You may need a Td booster every 10 years.  Varicella vaccine. You may need this if you have not been vaccinated.  Zoster vaccine. You may need this after age 45.  Measles, mumps, and rubella (MMR) vaccine. You may need at least one  dose of MMR if you were born in 1957 or later. You may also need a second dose.  Pneumococcal 13-valent conjugate (PCV13) vaccine. One dose is recommended after age 31.  Pneumococcal polysaccharide (PPSV23) vaccine. One dose is recommended after age 59.  Meningococcal vaccine. You may need this if you have certain conditions.  Hepatitis A vaccine. You may need this if you  have certain conditions or if you travel or work in places where you may be exposed to hepatitis A.  Hepatitis B vaccine. You may need this if you have certain conditions or if you travel or work in places where you may be exposed to hepatitis B.  Haemophilus influenzae type b (Hib) vaccine. You may need this if you have certain conditions.  Talk to your health care provider about which screenings and vaccines you need and how often you need them. This information is not intended to replace advice given to you by your health care provider. Make sure you discuss any questions you have with your health care provider. Document Released: 01/21/2015 Document Revised: 09/14/2015 Document Reviewed: 10/26/2014 Elsevier Interactive Patient Education  [employer].

## 2017-06-12 NOTE — Assessment & Plan Note (Signed)
BP stable. Asymptomatic. CMP today. Continue current regimen.

## 2017-06-12 NOTE — Assessment & Plan Note (Signed)
Followed by Cardiology. Is doing very well on current regimen. Asymptomatic. Euvolemic on examination.

## 2017-06-12 NOTE — Progress Notes (Signed)
Patient presents to clinic today for annual exam.  Patient is fasting for labs. Patient with history of hypertension, HOCM, CHF (all managed by Cardiology). Patient denies chest pain, palpitations, lightheadedness, dizziness, vision changes or frequent headaches. Has history of anxiety/depression for which she takes Fluoxetine. Notes mood is doing very well. Is trying to keep active and eat a healthy diet.   Acute Concerns: Denies acute concerns today.  Health Maintenance: Immunizations -- up-to-date. Colonoscopy -- up-to-date Mammogram -- Due. Denies concerns. Denies family history of breast cancer. Agrees to have at [facility]. Will place order.  PAP -- up-to-date Bone Density -- up-to-date  Past Medical History:  Diagnosis Date   Bradycardia    a. initial PPM placed 1994 at [facility] with gen change 2002. b. 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 Jude BiV PPM 05/08/12.   Chronic systolic CHF (congestive heart failure) ([place])    a. EF 30-35% dx in 2011 -> most recent 40-45% by echo 04/2012. b. s/p upgrade to BiV-PPM 05/08/12. c. Med titration limited by hypotension.   Cluster headache syndrome    Hypertr obst cardiomyop    Hypokalemia    Hypotension    Paroxysmal atrial fibrillation ([place])    a. identified on PPM interrogation b. longest episode 1 hour; if burden increases, will need [place]    Pneumonia 2008    Past Surgical History:  Procedure Laterality Date   BI-VENTRICULAR PACEMAKER UPGRADE N/A 05/08/2012   upgrade to [facility] Anthem (CRT-P) by Dr Rayann Heman   PACEMAKER INSERTION  1994; 2002; 05/08/2012   TONSILLECTOMY  ~ [place]?    Current Outpatient Medications on File Prior to Visit  Medication Sig Dispense Refill   albuterol (PROVENTIL HFA;VENTOLIN HFA) 108 (90 Base) MCG/ACT inhaler Inhale 1-2 puffs into the lungs every 6 (six) hours as needed for wheezing or shortness of breath. 1 Inhaler 1   ALPRAZolam (XANAX) 0.25 MG tablet Take 1  tablet (0.25 mg total) by mouth 2 (two) times daily as needed for anxiety. 30 tablet 3   AMBULATORY NON FORMULARY MEDICATION Medication Name: 100% O2 15 leters a min for 15-20 mins  Via Nasal cannual 1 Bottle 2   apixaban (ELIQUIS) 5 MG TABS tablet Take 1 tablet (5 mg total) by mouth 2 (two) times daily. 60 tablet 3   aspirin 81 MG chewable tablet Chew 81 mg by mouth daily.     carvedilol (COREG) 12.5 MG tablet TAKE 1 TABLET BY MOUTH 2 TIMES DAILY WITH A MEAL. 60 tablet 3   cyclobenzaprine (FLEXERIL) 5 MG tablet TAKE 1 TABLET (5 MG TOTAL) BY MOUTH 3 (THREE) TIMES DAILY AS NEEDED FOR MUSCLE SPASMS. 30 tablet 2   diclofenac sodium (VOLTAREN) 1 % GEL Apply 2 g topically 4 (four) times daily. 3 Tube 0   FLUoxetine (PROZAC) 20 MG capsule Take 1 capsule (20 mg total) by mouth daily. 30 capsule 3   fluticasone (FLONASE) 50 MCG/ACT nasal spray PLACE 2 SPRAYS INTO BOTH NOSTRILS DAILY. 16 g 5   ipratropium-albuterol (DUONEB) 0.5-2.5 (3) MG/3ML SOLN USE 1 VIAL (3ML) BY NEBULIZER EVERY 4 TO 6 HOURS AS NEEDED 90 mL 0   loratadine (CLARITIN) 10 MG tablet Take 1 tablet (10 mg total) by mouth daily. 30 tablet 5   losartan (COZAAR) 25 MG tablet Take 1 tablet (25 mg total) by mouth 2 (two) times daily. 60 tablet 6   nicotine (NICODERM CQ - DOSED IN MG/24 HOURS) 14  mg/24hr patch Place 1 patch (14 mg total) onto the skin daily. 28 patch 0   potassium chloride SA (K-DUR,KLOR-CON) 20 MEQ tablet Take 1 tablet (20 mEq total) by mouth daily. 90 tablet 3   Spacer/Aero-Holding Chambers (AEROCHAMBER MV) inhaler Use as instructed with Symbicort 1 each 0   spironolactone (ALDACTONE) 25 MG tablet Take 1 tablet (25 mg total) by mouth daily. 30 tablet 11   SUMAtriptan (IMITREX) 50 MG tablet TAKE 1 TABLET AT ONSET OF HEADACHE. MAY REPEAT IN 2 HOURS IF HEADACHE PERSISTS OR RECURS. 10 tablet 6   SYMBICORT 160-4.5 MCG/ACT inhaler INHALE 2 PUFFS BY MOUTH INTO THE LUNGS 2 TIMES A DAY 10.2 g 3   topiramate (TOPAMAX) 50  MG tablet Take 50 mg by mouth daily.     torsemide (DEMADEX) 20 MG tablet Take 1 tablet (20 mg total) by mouth 2 (two) times daily. 60 tablet 6   amitriptyline (ELAVIL) 10 MG tablet Take 10 mg by mouth.     No current facility-administered medications on file prior to visit.     Allergies  Allergen Reactions   Prednisone Other (See Comments)    SOB, CHF     Family History  Problem Relation Age of Onset   Heart disease Sister    Dementia Mother    Cancer Sister 63       uterine   Diabetes Unknown    Cardiomyopathy Sister    Diabetes Brother    Colon cancer Neg Hx    Esophageal cancer Neg Hx    Stomach cancer Neg Hx    Rectal cancer Neg Hx     Social History   Socioeconomic History   Marital status: Married    Spouse name: Not on file   Number of children: 2   Years of education: Not on file   Highest education level: Not on file  Occupational History   Occupation: [employer]    Employer: [employer]  Social Needs   Financial resource strain: Not on file   Food insecurity:    Worry: Not on file    Inability: Not on file   Transportation needs:    Medical: Not on file    Non-medical: Not on file  Tobacco Use   Smoking status: Former Smoker    Packs/day: 3.00   Smokeless tobacco: Never Used  Substance and Sexual Activity   Alcohol use: Yes    Alcohol/week: 1.2 oz    Types: 2 Glasses of wine per week   Drug use: No   Sexual activity: Never  Lifestyle   Physical activity:    Days per week: Not on file    Minutes per session: Not on file   Stress: Not on file  Relationships   Social connections:    Talks on phone: Not on file    Gets together: Not on file    Attends religious service: Not on file    Active member of club or organization: Not on file    Attends meetings of clubs or organizations: Not on file    Relationship status: Not on file   Intimate partner violence:    Fear of current or ex partner: Not  on file    Emotionally abused: Not on file    Physically abused: Not on file    Forced sexual activity: Not on file  Other Topics Concern   Not on file  Social History Narrative   ** Merged History Encounter **  Review of Systems  Constitutional: Negative for fever and weight loss.  HENT: Negative for ear discharge, ear pain, hearing loss and tinnitus.   Eyes: Negative for blurred vision, double vision, photophobia and pain.  Respiratory: Negative for cough and shortness of breath.   Cardiovascular: Negative for chest pain and palpitations.  Gastrointestinal: Negative for abdominal pain, blood in stool, constipation, diarrhea, heartburn, melena, nausea and vomiting.  Genitourinary: Negative for dysuria, flank pain, frequency, hematuria and urgency.  Musculoskeletal: Negative for falls.  Neurological: Negative for dizziness, loss of consciousness and headaches.  Endo/Heme/Allergies: Negative for environmental allergies.  Psychiatric/Behavioral: Negative for depression, hallucinations, substance abuse and suicidal ideas. The patient is not nervous/anxious and does not have insomnia.     BP 124/80    Pulse 63    Temp 97.6 F (36.4 C) (Oral)    Resp 14    Ht _0  (1.549 m)    Wt 125 lb (56.7 kg)    LMP  (LMP Unknown)    SpO2 98%    BMI 23.62 kg/m   Physical Exam  Constitutional: She is oriented to person, place, and time. She appears well-developed and well-nourished.  HENT:  Head: Normocephalic and atraumatic.  Right Ear: External ear normal.  Left Ear: External ear normal.  Nose: Nose normal.  Mouth/Throat: Oropharynx is clear and moist.  TM within normal limits bilaterally  Eyes: Pupils are equal, round, and reactive to light. Conjunctivae are normal.  Neck: Neck supple.  Cardiovascular: Normal rate, regular rhythm and normal heart sounds.  Pulmonary/Chest: Effort normal and breath sounds normal. No stridor. No respiratory distress. She has no wheezes. She has no rales.  She exhibits no tenderness.  Abdominal: Soft. Bowel sounds are normal. She exhibits no distension and no mass. There is no tenderness. There is no rebound and no guarding. No hernia.  Neurological: She is alert and oriented to person, place, and time.  Skin: Skin is warm.  Psychiatric: She has a normal mood and affect.  Vitals reviewed.   Recent Results (from the past 2160 hour(s))  Pain Mgmt, Profile 8 w/Conf, U     Status: Abnormal   Collection Time: 03/25/17 11:32 AM  Result Value Ref Range   Creatinine 34.9 > or = 20. mg/dL   pH 6.57 4.5 - 9.0   Oxidant NEGATIVE <200 mcg/mL   Amphetamines NEGATIVE <500 ng/mL   medMATCH Amphetamines CONSISTENT    Benzodiazepines NEGATIVE CONFIRMED <100 ng/mL   Alphahydroxyalprazolam NEGATIVE <25 ng/mL    Comment: See Note 1   medMATCH aOH alprazolam CONSISTENT    Alphahydroxymidazolam NEGATIVE <50 ng/mL    Comment: See Note 1   medMATCH aOH midazolam CONSISTENT    Alphahydroxytriazolam NEGATIVE <50 ng/mL    Comment: See Note 1   medMATCH aOH triazolam CONSISTENT    Aminoclonazepam NEGATIVE <25 ng/mL    Comment: See Note 1   medMATCH Aminoclonazepam CONSISTENT    Hydroxyethylflurazepam NEGATIVE <50 ng/mL    Comment: See Note 1   medMATCH OH,Et flurazepam CONSISTENT    Lorazepam NEGATIVE <50 ng/mL    Comment: See Note 1   medMATCH Lorazepam CONSISTENT    Nordiazepam NEGATIVE <50 ng/mL    Comment: See Note 1   medMATCH Nordiazepam CONSISTENT    Oxazepam NEGATIVE <50 ng/mL    Comment: See Note 1   medMATCH Oxazepam CONSISTENT    Temazepam NEGATIVE <50 ng/mL    Comment: See Note 1   medMATCH Temazepam CONSISTENT    Marijuana Metabolite NEGATIVE <  20 ng/mL   medMATCH Marijuana Metab CONSISTENT    Cocaine Metabolite NEGATIVE <150 ng/mL   medMATCH Cocaine Metab CONSISTENT    Opiates NEGATIVE <100 ng/mL   medMATCH Opiates CONSISTENT    Oxycodone NEGATIVE <100 ng/mL   medMATCH Oxycodone CONSISTENT    Buprenorphine, Urine NEGATIVE <5 ng/mL    medMATCH Buprenorphine CONSISTENT    MDMA NEGATIVE <500 ng/mL   [facility] MDMA CONSISTENT    Alcohol Metabolites POSITIVE (A) <500 ng/mL   Ethyl Glucuronide (ETG) 6,939 (H) <500 ng/mL    Comment: See Note 1   medMATCH ETG INCONSISTENT    Ethyl Sulfate (ETS) 2,464 (H) <100 ng/mL    Comment: See Note 1   medMATCH ETS INCONSISTENT     Comment: Note 1 . This test was developed and its analytical performance  characteristics have been determined by [employer]. It has not been cleared or approved by the FDA. This assay has been validated pursuant to the CLIA  regulations and is used for clinical purposes.    6 Acetylmorphine NEGATIVE <10 ng/mL   medMATCH 6 Acetylmorphine CONSISTENT     Comment: This drug testing is for medical treatment only.   Analysis was performed as non-forensic testing and  these results should be used only by healthcare  providers to render diagnosis or treatment, or to  monitor progress of medical conditions. Hazel Sams comments are:  - present when drug test results may be the result of     metabolism of one or more drugs or when results are     inconsistent with prescribed medication(s) listed.  - may be blank when drug results are consistent with     prescribed medication(s) listed. . For assistance with interpreting these drug results,  please contact a [employer] Toxicology  Specialist: [phone] [place] [phone]), M-F,  8am-6pm EST. This drug testing is for medical treatment only.   Analysis was performed as non-forensic testing and  these results should be used only by healthcare  providers to render diagnosis or treatment, or to  monitor progress of medical conditions. Hazel Sams comments are:  - present when  drug test results may be the result of     metabolism of one or more drugs or when results are     inconsistent with prescribed medication(s) listed.  - may be blank when drug results are consistent with      prescribed medication(s) listed. . For assistance with interpreting these drug results,  please contact a [employer] Toxicology  Specialist: [phone] [place] [phone]), M-F,  8am-6pm EST. This drug testing is for medical treatment only.   Analysis was performed as non-forensic testing and  these results should be used only by healthcare  providers to render diagnosis or treatment, or to  monitor progress of medical conditions. Hazel Sams comments are:  - present when drug test results may be the result of     metabolism of one or more drugs or when results are     inconsistent with prescribed medication(s) listed.  - may be blank when drug results are consistent with     prescribed medication(s) listed. . For assistance with interpreting these d rug results,  please contact a [employer] Toxicology  Specialist: [phone] [place] [phone]), M-F,  8am-6pm EST. This drug testing is for medical treatment only.   Analysis was performed as non-forensic testing and  these results should be used only by healthcare  providers to render diagnosis or treatment, or to  monitor progress of medical conditions. Hazel Sams comments are:  - present when drug test results may be the result of     metabolism of one or more drugs or when results are     inconsistent with prescribed medication(s) listed.  - may be blank when drug results are consistent with     prescribed medication(s) listed. . For assistance with interpreting these drug results,  please contact a [employer] Toxicology  Specialist: [phone] [place] [phone]), M-F,  8am-6pm EST. This drug testing is for medical treatment only.   Analysis was performed as non-forensic testing and  these results should be used only by healthcare  provi ders to render diagnosis or treatment, or to  monitor progress of medical conditions. Hazel Sams comments are:  - present when drug test results may be  the result of     metabolism of one or more drugs or when results are     inconsistent with prescribed medication(s) listed.  - may be blank when drug results are consistent with     prescribed medication(s) listed. . For assistance with interpreting these drug results,  please contact a [employer] Toxicology  Specialist: [phone] [place] [phone]), M-F,  8am-6pm EST.   Basic metabolic panel     Status: Abnormal   Collection Time: 03/25/17 12:21 PM  Result Value Ref Range   Sodium 138 135 - 145 mmol/L   Potassium 4.1 3.5 - 5.1 mmol/L   Chloride 102 101 - 111 mmol/L   CO2 25 22 - 32 mmol/L   Glucose, Bld 98 65 - 99 mg/dL   BUN 30 (H) 6 - 20 mg/dL   Creatinine, Ser 1.57 (H) 0.44 - 1.00 mg/dL   Calcium 9.2 8.9 - 10.3 mg/dL   GFR calc non Af Amer 34 (L) >60 mL/min   GFR calc Af Amer 39 (L) >60 mL/min    Comment: (NOTE) The eGFR has been calculated using the CKD EPI equation. This calculation has not been validated in all clinical situations. eGFR's persistently <60 mL/min signify possible Chronic Kidney Disease.    Anion gap 11 5 - 15    Comment: Performed at [facility] [address] [place], [place] [address]  CUP PACEART REMOTE DEVICE CHECK     Status: None   Collection Time: 04/15/17  9:48 AM  Result Value Ref Range   Date Time Interrogation Session [id number]    Pulse Generator Manufacturer [employer]    Pulse Gen Model 3210 Anthem RF    Pulse Gen Serial Number [id number]    Clinic Name [facility]    Implantable Pulse Generator Type Cardiac Resynch Therapy Pacemaker    Implantable Pulse Generator Implant Date 35465681    Implantable Lead Manufacturer [facility]    Implantable Lead Model 1258T QuickFlex     Implantable Lead Serial Number [id number]    Implantable Lead Implant Date 27517001    Implantable Lead Location Detail 1 Lateral Wall    Implantable Lead Location [id number]    Implantable Lead Manufacturer [employer]    Implantable Lead Model 4524 EasyTrak  3 LV-1, 80 cm    Implantable Lead Serial Number [id number]    Implantable Lead Implant Date [id number]    Implantable Lead Location [id number]    Implantable Lead Manufacturer [facility]    Implantable Lead Model 4024 CapSure SP    Implantable Lead Serial Number [id number]    Implantable Lead Implant Date [id number]    Implantable Lead Location [id number]    Lead  Channel Setting Sensing Sensitivity 2.0 mV   Lead Channel Setting Sensing Adaptation Mode Fixed Pacing    Lead Channel Setting Pacing Amplitude 2.0 V   Lead Channel Setting Pacing Pulse Width 0.4 ms   Lead Channel Setting Pacing Amplitude 2.0 V   Lead Channel Setting Pacing Pulse Width 1.0 ms   Lead Channel Setting Pacing Amplitude 2.5 V   Lead Channel Setting Pacing Capture Mode Fixed Pacing    Lead Channel Status     Lead Channel Impedance Value 580 ohm   Lead Channel Pacing Threshold Amplitude 0.75 V   Lead Channel Pacing Threshold Pulse Width 1.0 ms   Lead Channel Status     Lead Channel Impedance Value 460 ohm   Lead Channel Sensing Intrinsic Amplitude 2.3 mV   Lead Channel Pacing Threshold Amplitude 1.0 V   Lead Channel Pacing Threshold Pulse Width 0.4 ms   Lead Channel Status     Lead Channel Impedance Value 380 ohm   Lead Channel Sensing Intrinsic Amplitude 5.1 mV   Lead Channel Pacing Threshold Amplitude 1.0 V   Lead Channel Pacing Threshold Pulse Width 0.4 ms   Battery Status MOS    Battery Remaining Longevity 60 mo   Battery Remaining Percentage 73.0 %   Battery Voltage 2.92 V   Brady Statistic RA Percent Paced 52.0 %   Brady Statistic AP VP Percent 57.0 %   Brady Statistic AS VP Percent 43.0 %   Brady Statistic AP VS Percent 1.0 %   Brady Statistic AS VS Percent 1.0 %   Eval Rhythm AP,BiVP   Basic metabolic panel     Status: Abnormal   Collection Time: 05/17/17  2:25 PM  Result Value Ref Range   Sodium 141 135 - 145 mmol/L   Potassium 3.9 3.5 - 5.1 mmol/L   Chloride 104 101 - 111 mmol/L   CO2 28 22 - 32  mmol/L   Glucose, Bld 94 65 - 99 mg/dL   BUN 20 6 - 20 mg/dL   Creatinine, Ser 1.17 (H) 0.44 - 1.00 mg/dL   Calcium 9.2 8.9 - 10.3 mg/dL   GFR calc non Af Amer 48 (L) >60 mL/min   GFR calc Af Amer 55 (L) >60 mL/min    Comment: (NOTE) The eGFR has been calculated using the CKD EPI equation. This calculation has not been validated in all clinical situations. eGFR's persistently <60 mL/min signify possible Chronic Kidney Disease.    Anion gap 9 5 - 15    Comment: Performed at [facility] [address], [place], [place] [address]  B Nat Peptide     Status: Abnormal   Collection Time: 05/17/17  2:25 PM  Result Value Ref Range   B Natriuretic Peptide 772.8 (H) 0.0 - 100.0 pg/mL    Comment: Performed at [facility] [address], [place], [place] [address]    Assessment/Plan: Visit for preventive health examination Depression screen negative. Health Maintenance reviewed -- Immunizations, colonoscopy and PAP up-to-date. Due for mammogram. Preventive schedule discussed and handout given in AVS. Will obtain fasting labs today.   Breast cancer screening Order for 3D screening mammogram placed.  Paroxysmal atrial fibrillation ([place]) Managed by Cardiology. Is taking medications as directed. NSR today on auscultation.  Essential hypertension BP stable. Asymptomatic. CMP today. Continue current regimen.   Congestive cardiomyopathy ([place]) Followed by Cardiology. Is doing very well on current regimen. Asymptomatic. Euvolemic on examination.    Leeanne Rio, PA-C

## 2017-06-12 NOTE — Assessment & Plan Note (Signed)
Depression screen negative. Health Maintenance reviewed -- Immunizations, colonoscopy and PAP up-to-date. Due for mammogram. Preventive schedule discussed and handout given in AVS. Will obtain fasting labs today.

## 2017-06-12 NOTE — Assessment & Plan Note (Signed)
Managed by Cardiology. Is taking medications as directed. NSR today on auscultation.

## 2017-06-12 NOTE — Assessment & Plan Note (Signed)
Order for 3D screening mammogram placed.

## 2017-06-12 NOTE — Addendum Note (Signed)
Addended by: Davis Gourd on: 06/12/2017 09:15 AM   Modules accepted: Orders

## 2017-06-14 ENCOUNTER — Encounter: Payer: 59 | Admitting: Family Medicine

## 2017-06-17 ENCOUNTER — Encounter (INDEPENDENT_AMBULATORY_CARE_PROVIDER_SITE_OTHER): Payer: Self-pay

## 2017-06-17 ENCOUNTER — Other Ambulatory Visit: Payer: Self-pay | Admitting: Physician Assistant

## 2017-06-17 DIAGNOSIS — R7989 Other specified abnormal findings of blood chemistry: Secondary | ICD-10-CM

## 2017-06-20 ENCOUNTER — Encounter (HOSPITAL_COMMUNITY): Payer: Self-pay

## 2017-06-20 ENCOUNTER — Inpatient Hospital Stay (HOSPITAL_COMMUNITY)
Admission: EM | Admit: 2017-06-20 | Discharge: 2017-06-24 | DRG: 312 | Disposition: A | Discharge status: Home / Self Care | Payer: 59 | Attending: Internal Medicine | Admitting: Internal Medicine

## 2017-06-20 ENCOUNTER — Other Ambulatory Visit: Payer: Self-pay

## 2017-06-20 ENCOUNTER — Emergency Department (HOSPITAL_COMMUNITY): Payer: 59

## 2017-06-20 DIAGNOSIS — Z950 Presence of cardiac pacemaker: Secondary | ICD-10-CM

## 2017-06-20 DIAGNOSIS — I10 Essential (primary) hypertension: Secondary | ICD-10-CM | POA: Diagnosis not present

## 2017-06-20 DIAGNOSIS — I959 Hypotension, unspecified: Secondary | ICD-10-CM | POA: Diagnosis present

## 2017-06-20 DIAGNOSIS — I4510 Unspecified right bundle-branch block: Secondary | ICD-10-CM | POA: Diagnosis present

## 2017-06-20 DIAGNOSIS — R0603 Acute respiratory distress: Secondary | ICD-10-CM | POA: Diagnosis present

## 2017-06-20 DIAGNOSIS — F419 Anxiety disorder, unspecified: Secondary | ICD-10-CM | POA: Diagnosis present

## 2017-06-20 DIAGNOSIS — I5022 Chronic systolic (congestive) heart failure: Secondary | ICD-10-CM

## 2017-06-20 DIAGNOSIS — N183 Chronic kidney disease, stage 3 unspecified: Secondary | ICD-10-CM | POA: Diagnosis present

## 2017-06-20 DIAGNOSIS — I9589 Other hypotension: Secondary | ICD-10-CM | POA: Diagnosis present

## 2017-06-20 DIAGNOSIS — D509 Iron deficiency anemia, unspecified: Secondary | ICD-10-CM | POA: Diagnosis present

## 2017-06-20 DIAGNOSIS — T501X5A Adverse effect of loop [high-ceiling] diuretics, initial encounter: Secondary | ICD-10-CM | POA: Diagnosis present

## 2017-06-20 DIAGNOSIS — I5042 Chronic combined systolic (congestive) and diastolic (congestive) heart failure: Secondary | ICD-10-CM | POA: Diagnosis present

## 2017-06-20 DIAGNOSIS — R7989 Other specified abnormal findings of blood chemistry: Secondary | ICD-10-CM | POA: Diagnosis present

## 2017-06-20 DIAGNOSIS — I130 Hypertensive heart and chronic kidney disease with heart failure and stage 1 through stage 4 chronic kidney disease, or unspecified chronic kidney disease: Secondary | ICD-10-CM | POA: Diagnosis present

## 2017-06-20 DIAGNOSIS — F329 Major depressive disorder, single episode, unspecified: Secondary | ICD-10-CM | POA: Diagnosis present

## 2017-06-20 DIAGNOSIS — Z7951 Long term (current) use of inhaled steroids: Secondary | ICD-10-CM

## 2017-06-20 DIAGNOSIS — R945 Abnormal results of liver function studies: Secondary | ICD-10-CM | POA: Diagnosis not present

## 2017-06-20 DIAGNOSIS — R748 Abnormal levels of other serum enzymes: Secondary | ICD-10-CM | POA: Diagnosis not present

## 2017-06-20 DIAGNOSIS — I5043 Acute on chronic combined systolic (congestive) and diastolic (congestive) heart failure: Secondary | ICD-10-CM | POA: Diagnosis present

## 2017-06-20 DIAGNOSIS — R55 Syncope and collapse: Secondary | ICD-10-CM | POA: Diagnosis not present

## 2017-06-20 DIAGNOSIS — E860 Dehydration: Secondary | ICD-10-CM | POA: Diagnosis not present

## 2017-06-20 DIAGNOSIS — I421 Obstructive hypertrophic cardiomyopathy: Secondary | ICD-10-CM | POA: Diagnosis not present

## 2017-06-20 DIAGNOSIS — R778 Other specified abnormalities of plasma proteins: Secondary | ICD-10-CM | POA: Diagnosis present

## 2017-06-20 DIAGNOSIS — Z888 Allergy status to other drugs, medicaments and biological substances status: Secondary | ICD-10-CM

## 2017-06-20 DIAGNOSIS — R0602 Shortness of breath: Secondary | ICD-10-CM

## 2017-06-20 DIAGNOSIS — I4892 Unspecified atrial flutter: Secondary | ICD-10-CM | POA: Diagnosis present

## 2017-06-20 DIAGNOSIS — Z87891 Personal history of nicotine dependence: Secondary | ICD-10-CM

## 2017-06-20 DIAGNOSIS — N179 Acute kidney failure, unspecified: Secondary | ICD-10-CM | POA: Diagnosis present

## 2017-06-20 DIAGNOSIS — Z7901 Long term (current) use of anticoagulants: Secondary | ICD-10-CM

## 2017-06-20 DIAGNOSIS — I952 Hypotension due to drugs: Secondary | ICD-10-CM | POA: Diagnosis present

## 2017-06-20 DIAGNOSIS — R4182 Altered mental status, unspecified: Secondary | ICD-10-CM | POA: Diagnosis not present

## 2017-06-20 DIAGNOSIS — R000 Tachycardia, unspecified: Secondary | ICD-10-CM | POA: Diagnosis not present

## 2017-06-20 DIAGNOSIS — J441 Chronic obstructive pulmonary disease with (acute) exacerbation: Secondary | ICD-10-CM | POA: Diagnosis present

## 2017-06-20 DIAGNOSIS — J9602 Acute respiratory failure with hypercapnia: Secondary | ICD-10-CM | POA: Diagnosis present

## 2017-06-20 DIAGNOSIS — R079 Chest pain, unspecified: Secondary | ICD-10-CM

## 2017-06-20 DIAGNOSIS — E876 Hypokalemia: Secondary | ICD-10-CM | POA: Diagnosis not present

## 2017-06-20 DIAGNOSIS — J9601 Acute respiratory failure with hypoxia: Secondary | ICD-10-CM | POA: Diagnosis present

## 2017-06-20 DIAGNOSIS — I248 Other forms of acute ischemic heart disease: Secondary | ICD-10-CM | POA: Diagnosis present

## 2017-06-20 DIAGNOSIS — R402441 Other coma, without documented Glasgow coma scale score, or with partial score reported, in the field [EMT or ambulance]: Secondary | ICD-10-CM | POA: Diagnosis not present

## 2017-06-20 DIAGNOSIS — J449 Chronic obstructive pulmonary disease, unspecified: Secondary | ICD-10-CM | POA: Diagnosis present

## 2017-06-20 DIAGNOSIS — F32A Depression, unspecified: Secondary | ICD-10-CM | POA: Diagnosis present

## 2017-06-20 DIAGNOSIS — R740 Nonspecific elevation of levels of transaminase and lactic acid dehydrogenase [LDH]: Secondary | ICD-10-CM | POA: Diagnosis present

## 2017-06-20 DIAGNOSIS — Z79899 Other long term (current) drug therapy: Secondary | ICD-10-CM

## 2017-06-20 DIAGNOSIS — M810 Age-related osteoporosis without current pathological fracture: Secondary | ICD-10-CM | POA: Diagnosis present

## 2017-06-20 DIAGNOSIS — I480 Paroxysmal atrial fibrillation: Secondary | ICD-10-CM | POA: Diagnosis present

## 2017-06-20 LAB — CBC
HEMATOCRIT: 40.6 % (ref 36.0–46.0)
Hemoglobin: 13.4 g/dL (ref 12.0–15.0)
MCH: 33.1 pg (ref 26.0–34.0)
MCHC: 33 g/dL (ref 30.0–36.0)
MCV: 100.2 fL — AB (ref 78.0–100.0)
PLATELETS: 190 10*3/uL (ref 150–400)
RBC: 4.05 MIL/uL (ref 3.87–5.11)
RDW: 13.9 % (ref 11.5–15.5)
WBC: 4.5 10*3/uL (ref 4.0–10.5)

## 2017-06-20 LAB — COMPREHENSIVE METABOLIC PANEL
ALT: 71 U/L — ABNORMAL HIGH (ref 14–54)
AST: 117 U/L — ABNORMAL HIGH (ref 15–41)
Albumin: 3.9 g/dL (ref 3.5–5.0)
Alkaline Phosphatase: 99 U/L (ref 38–126)
Anion gap: 15 (ref 5–15)
BILIRUBIN TOTAL: 0.9 mg/dL (ref 0.3–1.2)
BUN: 31 mg/dL — ABNORMAL HIGH (ref 6–20)
CHLORIDE: 105 mmol/L (ref 101–111)
CO2: 18 mmol/L — ABNORMAL LOW (ref 22–32)
Calcium: 8.8 mg/dL — ABNORMAL LOW (ref 8.9–10.3)
Creatinine, Ser: 1.53 mg/dL — ABNORMAL HIGH (ref 0.44–1.00)
GFR, EST AFRICAN AMERICAN: 40 mL/min — AB (ref >60)
GFR, EST NON AFRICAN AMERICAN: 35 mL/min — AB (ref >60)
Glucose, Bld: 225 mg/dL — ABNORMAL HIGH (ref 65–99)
Potassium: 4.7 mmol/L (ref 3.5–5.1)
Sodium: 138 mmol/L (ref 135–145)
TOTAL PROTEIN: 6.4 g/dL — AB (ref 6.5–8.1)

## 2017-06-20 LAB — TROPONIN I
TROPONIN I: 0.03 ng/mL — AB (ref <0.03)
Troponin I: 0.06 ng/mL (ref <0.03)

## 2017-06-20 LAB — BRAIN NATRIURETIC PEPTIDE: B NATRIURETIC PEPTIDE 5: 781.6 pg/mL — AB (ref 0.0–100.0)

## 2017-06-20 MED ORDER — SODIUM CHLORIDE 0.9 % IV SOLN
INTRAVENOUS | Status: DC
  Administered 2017-06-20: 19:00:00 via INTRAVENOUS

## 2017-06-20 MED ORDER — SODIUM CHLORIDE 0.9 % IV BOLUS
500.0000 mL | Freq: Once | INTRAVENOUS | Status: AC
  Administered 2017-06-20: 500 mL via INTRAVENOUS

## 2017-06-20 NOTE — H&P (Signed)
History and Physical    TYGER OKA UUV:[id number] DOB: Nov 16, 1950 DOA: 06/20/2017  Referring MD/NP/PA:   PCP: Midge Minium, MD   Patient coming from:  The patient is coming from home.  At baseline, pt is independent for most of ADL.   Chief Complaint: syncope  HPI: Angela Shepherd is a 67 y.o. female with medical history significant of sCHF with EF of 45%, COPD, PAF on Eliquis, cluster headaches, HOCM, CKD 3, depression, anxiety, who presents with syncope.  Pt states she suddenly started feeling nauseous, shortness of breath, sweaty when she was in drugstore at about 3:30 PM, the she passed out, she is not sure how long she has passed out. She also has mild CP, which has lately resolved.  Currently patient does not have chest pain, shortness of breath, cough.  No fever or chills. Patient was reportedly to have wheezing and treated with bronchodilator, but currently patient does not have any wheezing on auscultation per my examination.  Patient denies any unilateral weakness or numbness in extremities.  No facial droop or slurred speech.  No symptoms of UTI.  Currently no nausea vomiting, diarrhea or abdominal pain.  Patient was found to have hypotension with blood pressure 71/57, which improved to 90/57, then 103/60 after giving 500 cc normal saline and short period of normal saline infusion at 125 cc/h.   ED Course: pt was found to have troponin 0.03-->0.06, BNP 781, WBC 4.5, worsening renal function, temperature normal, no tachycardia, no tachypnea, oxygen saturation 98% on room air currently, negative chest x-ray.  Patient is placed on telemetry bed for observation.  Review of Systems:   General: no fevers, chills, no body weight gain, has fatigue HEENT: no blurry vision, hearing changes or sore throat Respiratory: had dyspnea, no coughing, wheezing CV: had chest pain, no palpitations GI: currently no nausea, vomiting, abdominal pain, diarrhea, constipation GU: no dysuria,  burning on urination, increased urinary frequency, hematuria  Ext: no leg edema Neuro: no unilateral weakness, numbness, or tingling, no vision change or hearing loss. Had syncope. Skin: no rash, no skin tear. MSK: No muscle spasm, no deformity, no limitation of range of movement in spin Heme: No easy bruising.  Travel history: No recent long distant travel.  Allergy:  Allergies  Allergen Reactions  . Prednisone Other (See Comments)    SOB, CHF     Past Medical History:  Diagnosis Date  . Bradycardia    a. initial PPM placed 1994 at [facility] with gen change 2002. b. 1*AVB & intermittent 2nd degree AVB + CHF --> upgrade to [facility]. Jude BiV PPM 05/08/12.  . Chronic systolic CHF (congestive heart failure) ([place])    a. EF 30-35% dx in 2011 -> most recent 40-45% by echo 04/2012. b. s/p upgrade to BiV-PPM 05/08/12. c. Med titration limited by hypotension.  . Cluster headache syndrome   . Hypertr obst cardiomyop   . Hypokalemia   . Hypotension   . Paroxysmal atrial fibrillation (HCC)    a. identified on PPM interrogation b. longest episode 1 hour; if burden increases, will need [place]ram   . Pneumonia 2008    Past Surgical History:  Procedure Laterality Date  . BI-VENTRICULAR PACEMAKER UPGRADE N/A 05/08/2012   upgrade to SJM Anthem (CRT-P) by Dr Rayann Heman  . PACEMAKER INSERTION  1994; 2002; 05/08/2012  . TONSILLECTOMY  ~ 1959  . TUBAL LIGATION  1984?    Social History:  reports that she has quit smoking. She smoked 3.00 packs per day.  She has never used smokeless tobacco. She reports that she drinks about 1.2 oz of alcohol per week. She reports that she does not use drugs.  Family History:  Family History  Problem Relation Age of Onset  . Heart disease Sister   . Dementia Mother   . Cancer Sister 75       uterine  . Diabetes Unknown   . Cardiomyopathy Sister   . Diabetes Brother   . Colon cancer Neg Hx   . Esophageal cancer Neg Hx   . Stomach cancer Neg Hx   . Rectal cancer Neg Hx       Prior to Admission medications   Medication Sig Start Date End Date Taking? Authorizing Provider  albuterol (PROVENTIL HFA;VENTOLIN HFA) 108 (90 Base) MCG/ACT inhaler Inhale 1-2 puffs into the lungs every 6 (six) hours as needed for wheezing or shortness of breath. 07/30/16  Yes Byrum, Rose Fillers, MD  ALPRAZolam Duanne Moron) 0.25 MG tablet Take 1 tablet (0.25 mg total) by mouth 2 (two) times daily as needed for anxiety. 03/25/17  Yes Midge Minium, MD  AMBULATORY NON FORMULARY MEDICATION Medication Name: 100% O2 15 leters a min for 15-20 mins  Via Nasal cannual Patient taking differently: Medication Name: 100% O2 15 leters a min for 15-20 mins as needed  Via Nasal cannual 07/13/14  Yes Jaffe, Adam R, DO  amitriptyline (ELAVIL) 10 MG tablet Take 10 mg by mouth as needed (headaches).    Yes [provider]  apixaban (ELIQUIS) 5 MG TABS tablet Take 1 tablet (5 mg total) by mouth 2 (two) times daily. 11/28/16  Yes Bensimhon, Shaune Pascal, MD  aspirin 81 MG chewable tablet Chew 81 mg by mouth daily.   Yes [provider]  carvedilol (COREG) 12.5 MG tablet TAKE 1 TABLET BY MOUTH 2 TIMES DAILY WITH A MEAL. 05/08/17  Yes Bensimhon, Shaune Pascal, MD  cyclobenzaprine (FLEXERIL) 5 MG tablet TAKE 1 TABLET (5 MG TOTAL) BY MOUTH 3 (THREE) TIMES DAILY AS NEEDED FOR MUSCLE SPASMS. 04/18/15  Yes Midge Minium, MD  diclofenac sodium (VOLTAREN) 1 % GEL Apply 2 g topically 4 (four) times daily. Patient taking differently: Apply 2 g topically 4 (four) times daily as needed (pain).  06/12/16  Yes Hudnall, Sharyn Lull, MD  FLUoxetine (PROZAC) 20 MG capsule Take 1 capsule (20 mg total) by mouth daily. 03/25/17  Yes Midge Minium, MD  fluticasone (FLONASE) 50 MCG/ACT nasal spray PLACE 2 SPRAYS INTO BOTH NOSTRILS DAILY. Patient taking differently: Place 2 sprays into both nostrils daily as needed for allergies.  08/23/16  Yes Byrum, Rose Fillers, MD  ipratropium-albuterol (DUONEB) 0.5-2.5 (3) MG/3ML SOLN USE 1 VIAL  (3ML) BY NEBULIZER EVERY 4 TO 6 HOURS AS NEEDED Patient taking differently: Take 3 mLs by nebulization every 4 (four) hours as needed (shortness of breath).  02/28/17  Yes Midge Minium, MD  loratadine (CLARITIN) 10 MG tablet Take 1 tablet (10 mg total) by mouth daily. 03/15/15  Yes Collene Gobble, MD  losartan (COZAAR) 25 MG tablet Take 1 tablet (25 mg total) by mouth 2 (two) times daily. 05/17/17 08/15/17 Yes Bensimhon, Shaune Pascal, MD  nicotine (NICODERM CQ - DOSED IN MG/24 HOURS) 14 mg/24hr patch Place 1 patch (14 mg total) onto the skin daily. Patient taking differently: Place 14 mg onto the skin daily as needed (nicotine withdrawal).  04/13/14  Yes Reyne Dumas, MD  potassium chloride SA (K-DUR,KLOR-CON) 20 MEQ tablet Take 1 tablet (20 mEq total) by  mouth daily. 09/19/16  Yes Clegg, Amy D, NP  Spacer/Aero-Holding Chambers (AEROCHAMBER MV) inhaler Use as instructed with Symbicort 03/15/15  Yes Byrum, Rose Fillers, MD  spironolactone (ALDACTONE) 25 MG tablet Take 1 tablet (25 mg total) by mouth daily. 03/12/17  Yes Shirley Friar, PA-C  SUMAtriptan (IMITREX) 50 MG tablet TAKE 1 TABLET AT ONSET OF HEADACHE. MAY REPEAT IN 2 HOURS IF HEADACHE PERSISTS OR RECURS. Patient taking differently: TAKE 1 TABLET (50MG ) AS NEEDED AT ONSET OF HEADACHE. MAY REPEAT IN 2 HOURS IF HEADACHE PERSISTS OR RECURS. 10/17/15  Yes Midge Minium, MD  SYMBICORT 160-4.5 MCG/ACT inhaler INHALE 2 PUFFS BY MOUTH INTO THE LUNGS 2 TIMES A DAY 02/25/17  Yes Midge Minium, MD  topiramate (TOPAMAX) 50 MG tablet Take 50 mg by mouth daily.   Yes [provider]  torsemide (DEMADEX) 20 MG tablet Take 1 tablet (20 mg total) by mouth 2 (two) times daily. Patient taking differently: Take 40 mg by mouth 2 (two) times daily.  01/30/17 05/18/18 Yes Bensimhon, Shaune Pascal, MD    Physical Exam: Vitals:   06/21/17 0118 06/21/17 0121 06/21/17 0123 06/21/17 0126  BP: 102/69 111/75 110/69 113/69  Pulse: 68 67 67 68  Resp: 20 20     Temp: 97.7 F (36.5 C)     TempSrc: Oral     SpO2: 100% 100%    Weight:  58 kg (127 lb 14.4 oz)    Height:  5\' 1"  (1.549 m)     General: Not in acute distress HEENT:       Eyes: PERRL, EOMI, no scleral icterus.       ENT: No discharge from the ears and nose, no pharynx injection, no tonsillar enlargement.        Neck: No JVD, no bruit, no mass felt. Heme: No neck lymph node enlargement. Cardiac: S1/S2, RRR, No murmurs, No gallops or rubs. Respiratory: No rales, wheezing, rhonchi or rubs. GI: Soft, nondistended, nontender, no rebound pain, no organomegaly, BS present. GU: No hematuria Ext: No pitting leg edema bilaterally. 2+DP/PT pulse bilaterally. Musculoskeletal: No joint deformities, No joint redness or warmth, no limitation of ROM in spin. Skin: No rashes.  Neuro: Alert, oriented X3, cranial nerves II-XII grossly intact, moves all extremities normally. Muscle strength 5/5 in all extremities, sensation to light touch intact. Brachial reflex 2+ bilaterally. Negative Babinski's sign. Normal finger to nose test. Psych: Patient is not psychotic, no suicidal or hemocidal ideation.  Labs on Admission: I have personally reviewed following labs and imaging studies  CBC: Recent Labs  Lab 06/20/17 1644  WBC 4.5  HGB 13.4  HCT 40.6  MCV 100.2*  PLT 253   Basic Metabolic Panel: Recent Labs  Lab 06/20/17 1644  NA 138  K 4.7  CL 105  CO2 18*  GLUCOSE 225*  BUN 31*  CREATININE 1.53*  CALCIUM 8.8*   GFR: Estimated Creatinine Clearance: 30 mL/min (A) (by C-G formula based on SCr of 1.53 mg/dL (H)). Liver Function Tests: Recent Labs  Lab 06/20/17 1644  AST 117*  ALT 71*  ALKPHOS 99  BILITOT 0.9  PROT 6.4*  ALBUMIN 3.9   No results for input(s): LIPASE, AMYLASE in the last 168 hours. No results for input(s): AMMONIA in the last 168 hours. Coagulation Profile: No results for input(s): INR, PROTIME in the last 168 hours. Cardiac Enzymes: Recent Labs  Lab  06/20/17 1644 06/20/17 2106 06/21/17 0143  TROPONINI 0.03* 0.06* 0.06*   BNP (last 3 results)  No results for input(s): PROBNP in the last 8760 hours. HbA1C: Recent Labs    06/21/17 0143  HGBA1C 5.7*   CBG: No results for input(s): GLUCAP in the last 168 hours. Lipid Profile: Recent Labs    06/21/17 0143  CHOL 179  HDL 65  LDLCALC 106*  TRIG 42  CHOLHDL 2.8   Thyroid Function Tests: No results for input(s): TSH, T4TOTAL, FREET4, T3FREE, THYROIDAB in the last 72 hours. Anemia Panel: No results for input(s): VITAMINB12, FOLATE, FERRITIN, TIBC, IRON, RETICCTPCT in the last 72 hours. Urine analysis:    Component Value Date/Time   COLORURINE YELLOW 04/10/2014 1128   APPEARANCEUR CLEAR 04/10/2014 1128   LABSPEC 1.026 04/10/2014 1128   PHURINE 5.5 04/10/2014 1128   GLUCOSEU 100 (A) 04/10/2014 1128   HGBUR NEGATIVE 04/10/2014 1128   BILIRUBINUR NEGATIVE 04/10/2014 1128   KETONESUR NEGATIVE 04/10/2014 1128   PROTEINUR 100 (A) 04/10/2014 1128   UROBILINOGEN 0.2 04/10/2014 1128   NITRITE NEGATIVE 04/10/2014 1128   LEUKOCYTESUR NEGATIVE 04/10/2014 1128   Sepsis Labs: @LABRCNTIP (procalcitonin:4,lacticidven:4) )No results found for this or any previous visit (from the past 240 hour(s)).   Radiological Exams on Admission: Dg Chest 2 View  Result Date: 06/20/2017 CLINICAL DATA:  Shortness of breath EXAM: CHEST - 2 VIEW COMPARISON:  03/02/2017 FINDINGS: Cardiac shadow is enlarged but stable. Pacing device is again seen and stable. Chronic changes are again noted in the right mid lung. No new focal infiltrate or effusion is seen. No bony abnormality is noted. IMPRESSION: Stable chronic changes.  No acute abnormality is seen. Electronically Signed   By: Inez Catalina M.D.   On: 06/20/2017 17:13   Ct Head Wo Contrast  Result Date: 06/21/2017 CLINICAL DATA:  Syncope EXAM: CT HEAD WITHOUT CONTRAST TECHNIQUE: Contiguous axial images were obtained from the base of the skull through the  vertex without intravenous contrast. COMPARISON:  None. FINDINGS: Brain: No evidence of acute infarction, hemorrhage, hydrocephalus, extra-axial collection or mass lesion/mass effect. Vascular: No hyperdense vessel or unexpected calcification. Minimal calcification at the carotid siphons. Skull: Normal. Negative for fracture or focal lesion. Sinuses/Orbits: No acute finding. Other: None IMPRESSION: Negative.  No CT evidence for acute intracranial abnormality. Electronically Signed   By: Donavan Foil M.D.   On: 06/21/2017 00:58     EKG: Independently reviewed.  Atrial fibrillation, QTc 490, bifascicular block  Assessment/Plan Principal Problem:   Syncope Active Problems:   Essential hypertension   COPD (chronic obstructive pulmonary disease) (HCC)   Anxiety and depression   Elevated troponin   Chronic combined systolic (congestive) and diastolic (congestive) heart failure (HCC)   HOCM (hypertrophic obstructive cardiomyopathy) (HCC)   Paroxysmal atrial fibrillation (HCC)   Acute renal failure superimposed on stage 3 chronic kidney disease (HCC)   Hypotension   Abnormal LFTs   Syncope: Etiology is not clear.  Possibly related to hypotension.  Other differential diagnosis including ACS given positive troponin and HOCM.  No focal neurologic findings on physical examination, less likely to have stroke.  Blood pressure responded to IV fluid resuscitation, improved to 103/64. - Place on tele bed for obs - will get CT-head-->netgative for acute intracranial abnormalities - 2d echo - Neuro checks  - IVF: 500 cc NS, then 50 cc/h - PT/OT eval and treat - in pt card consult was requested via Epic.  Hypotension: Etiology is not clear.  Blood pressure responded to IV fluid quickly.  After 500 cc normal saline, blood pressure improved from1/57 to 103/64.  Currently hemodynamically  stable.  No fever or leukocytosis.  No any signs of infection.  Chest x-ray negative.  May be due to overdiuresis. -will  check UA and cortisol leve -continue gentle IVF, 50cc/h of NS -Hold torsemide and spironolactone  Essential hypertension: -hold home Bp meds, Coreg and Cozaar -IV hydralazine as needed  COPD (chronic obstructive pulmonary disease) ([place]): stable. -DuoNeb nebulizers, Dulera inhaler, as needed albuterol nebulizer  Depression and anxiety: Stable, no suicidal or homicidal ideations. -Continue home medications: Xanax, amitriptyline,  Chronic combined systolic and diastolic heart failure: 2D echo on 04/12/2014 showed EF of 45-50% with grade 2 diastolic dysfunction.  BNP elevated 781, but patient does not have leg edema or JVD.  No pulmonary edema on chest x-ray.  CHF seems to be compensated. -Hold diuretics due to hypotension  Elevated troponin: trop 0.03-->0.06. No CP likely due to demand ischemia secondary to hypotension..  - cycle CE q6 x3 and repeat EKG in the am  - prn Nitroglycerin, Morphine, and aspirin - Risk factor stratification: will check FLP and A1C  - 2d echo - in pt card consult was requested via Epic.  HOCM (hypertrophic obstructive cardiomyopathy) ([place]): -f/u 2d echo. -avoid over diuresis  Atrial Fibrillation: CHA2DS2-VASc Score is 3, needs oral anticoagulation. Patient is on Eliquis at home. INR is  on admission. Heart rate is well controlled. -hold  Coreg -Continue Eliquis  AoCKD-III: Baseline Cre is 1.1-1.3, pt's Cre is 1.53 on admission. Likely due to prerenal secondary to dehydration and continuation of ACEI, diuretics.  ATN is also possible given hypotension. - IVF as above - Follow up renal function by BMP - Hold diuretics  Abnormal LFTs: AST 117, ALT 71, ALP 99, total bilirubin 0.9.  Etiology is not clear.  May be due to hypoperfusion secondary to hypotension. - Check Tylenol level -Check hepatitis panel  DVT ppx:  SQ Lovenox Code Status: Full code Family Communication: None at bed side.   Disposition Plan:  Anticipate discharge back to previous home  environment Consults called:  none Admission status: Obs / tele       Date of Service 06/21/2017    Ivor Costa [facility] Pager [phone]  If 7PM-7AM, please contact night-coverage [email or web address] Password [facility] 06/21/2017, 4:58 AM

## 2017-06-20 NOTE — ED Provider Notes (Signed)
[facility] Provider Note   CSN: [id number] Arrival date & time: 06/20/17  1628     History   Chief Complaint Chief Complaint  Patient presents with  . Loss of Consciousness  . Anxiety  . Shortness of Breath    HPI MAYME PROFETA is a 67 y.o. female.  67 year old female who presents after syncopal event just prior to arrival.  States that she has a history of anxiety as well as flash pulmonary edema.  States that she became short of breath and attempted to use her inhaler which did not help.  She did not take a dose of Xanax as well as breathe into a paper bag.  She then went unresponsive for short period of time.  No seizure activity was noted.  Denied any chest pain prior to the event.  This was called and patient gave albuterol as well as Atrovent.  Does have a history of A. fib.  Foot is almost back to her baseline at this time.     Past Medical History:  Diagnosis Date  . Bradycardia    a. initial PPM placed 1994 at [facility] with gen change 2002. b. 1*AVB & intermittent 2nd degree AVB + CHF --> upgrade to [facility]. Jude BiV PPM 05/08/12.  . Chronic systolic CHF (congestive heart failure) ([place])    a. EF 30-35% dx in 2011 -> most recent 40-45% by echo 04/2012. b. s/p upgrade to BiV-PPM 05/08/12. c. Med titration limited by hypotension.  . Cluster headache syndrome   . Hypertr obst cardiomyop   . Hypokalemia   . Hypotension   . Paroxysmal atrial fibrillation (HCC)    a. identified on PPM interrogation b. longest episode 1 hour; if burden increases, will need [place]   . Pneumonia 2008    Patient Active Problem List   Diagnosis Date Noted  . Visit for preventive health examination 06/12/2017  . Breast cancer screening 06/12/2017  . Paroxysmal atrial fibrillation ([place]) 03/03/2017  . HOCM (hypertrophic obstructive cardiomyopathy) ([place]) 01/15/2017  . Stage 2 chronic kidney disease 10/16/2016  . Atypical chest pain 10/11/2016  . Osteoporosis  09/07/2016  . Right hip pain 06/28/2016  . Chronic systolic heart failure ([place]) 03/04/2015  . Episodic cluster headache, not intractable 11/05/2014  . Elevated troponin 04/12/2014  . Congestive cardiomyopathy ([place]) 04/11/2014  . Status post biventricular pacemaker 04/11/2014  . Hx of migraine headaches 04/11/2014  . Shortness of breath 04/11/2014  . Migraine-cluster headache syndrome 12/11/2013  . Anxiety and depression 01/25/2013  . Back pain 05/01/2012  . Bradycardia 10/08/2011  . Encounter for routine gynecological examination 05/04/2011  . Screening for malignant neoplasm of cervix 05/04/2011  . Post-menopausal 05/04/2011  . Chronic diastolic heart failure ([place]) 01/23/2011  . Chronic allergic rhinitis 09/29/2010  . COPD (chronic obstructive pulmonary disease) ([place]) 08/30/2010  . Former smoker 04/06/2010  . Essential hypertension 01/20/2010  . CARDIOMYOPATHY, CONSTRICTIVE/RESTRICTIVE 01/20/2010    Past Surgical History:  Procedure Laterality Date  . BI-VENTRICULAR PACEMAKER UPGRADE N/A 05/08/2012   upgrade to [facility] (CRT-P) by Dr Rayann Heman  . PACEMAKER INSERTION  1994; 2002; 05/08/2012  . TONSILLECTOMY  ~ 1959  . TUBAL LIGATION  1984?     OB History    Gravida  0   Para  0   Term  0   Preterm  0   AB  0   Living        SAB  0   TAB  0  Ectopic  0   Multiple      Live Births               Home Medications    Prior to Admission medications   Medication Sig Start Date End Date Taking? Authorizing Provider  albuterol (PROVENTIL HFA;VENTOLIN HFA) 108 (90 Base) MCG/ACT inhaler Inhale 1-2 puffs into the lungs every 6 (six) hours as needed for wheezing or shortness of breath. 07/30/16   Byrum, Rose Fillers, MD  ALPRAZolam Duanne Moron) 0.25 MG tablet Take 1 tablet (0.25 mg total) by mouth 2 (two) times daily as needed for anxiety. 03/25/17   Midge Minium, MD  AMBULATORY NON FORMULARY MEDICATION Medication Name: 100% O2 15 leters a min for 15-20 mins  Via Nasal  cannual 07/13/14   Pieter Partridge, DO  amitriptyline (ELAVIL) 10 MG tablet Take 10 mg by mouth.    [provider]  apixaban (ELIQUIS) 5 MG TABS tablet Take 1 tablet (5 mg total) by mouth 2 (two) times daily. 11/28/16   Bensimhon, Shaune Pascal, MD  aspirin 81 MG chewable tablet Chew 81 mg by mouth daily.    [provider]  carvedilol (COREG) 12.5 MG tablet TAKE 1 TABLET BY MOUTH 2 TIMES DAILY WITH A MEAL. 05/08/17   Bensimhon, Shaune Pascal, MD  cyclobenzaprine (FLEXERIL) 5 MG tablet TAKE 1 TABLET (5 MG TOTAL) BY MOUTH 3 (THREE) TIMES DAILY AS NEEDED FOR MUSCLE SPASMS. 04/18/15   Midge Minium, MD  diclofenac sodium (VOLTAREN) 1 % GEL Apply 2 g topically 4 (four) times daily. 06/12/16   Hudnall, Sharyn Lull, MD  FLUoxetine (PROZAC) 20 MG capsule Take 1 capsule (20 mg total) by mouth daily. 03/25/17   Midge Minium, MD  fluticasone (FLONASE) 50 MCG/ACT nasal spray PLACE 2 SPRAYS INTO BOTH NOSTRILS DAILY. 08/23/16   Collene Gobble, MD  ipratropium-albuterol (DUONEB) 0.5-2.5 (3) MG/3ML SOLN USE 1 VIAL (3ML) BY NEBULIZER EVERY 4 TO 6 HOURS AS NEEDED 02/28/17   Midge Minium, MD  loratadine (CLARITIN) 10 MG tablet Take 1 tablet (10 mg total) by mouth daily. 03/15/15   Collene Gobble, MD  losartan (COZAAR) 25 MG tablet Take 1 tablet (25 mg total) by mouth 2 (two) times daily. 05/17/17 08/15/17  Bensimhon, Shaune Pascal, MD  nicotine (NICODERM CQ - DOSED IN MG/24 HOURS) 14 mg/24hr patch Place 1 patch (14 mg total) onto the skin daily. 04/13/14   Reyne Dumas, MD  potassium chloride SA (K-DUR,KLOR-CON) 20 MEQ tablet Take 1 tablet (20 mEq total) by mouth daily. 09/19/16   Conrad Lockwood, NP  Spacer/Aero-Holding Chambers (AEROCHAMBER MV) inhaler Use as instructed with Symbicort 03/15/15   Collene Gobble, MD  spironolactone (ALDACTONE) 25 MG tablet Take 1 tablet (25 mg total) by mouth daily. 03/12/17   Shirley Friar, PA-C  SUMAtriptan (IMITREX) 50 MG tablet TAKE 1 TABLET AT ONSET OF HEADACHE. MAY REPEAT  IN 2 HOURS IF HEADACHE PERSISTS OR RECURS. 10/17/15   Midge Minium, MD  SYMBICORT 160-4.5 MCG/ACT inhaler INHALE 2 PUFFS BY MOUTH INTO THE LUNGS 2 TIMES A DAY 02/25/17   Midge Minium, MD  topiramate (TOPAMAX) 50 MG tablet Take 50 mg by mouth daily.    [provider]  torsemide (DEMADEX) 20 MG tablet Take 1 tablet (20 mg total) by mouth 2 (two) times daily. 01/30/17 05/18/18  Bensimhon, Shaune Pascal, MD    Family History Family History  Problem Relation Age of Onset  . Heart disease  Sister   . Dementia Mother   . Cancer Sister 72       uterine  . Diabetes Unknown   . Cardiomyopathy Sister   . Diabetes Brother   . Colon cancer Neg Hx   . Esophageal cancer Neg Hx   . Stomach cancer Neg Hx   . Rectal cancer Neg Hx     Social History Social History   Tobacco Use  . Smoking status: Former Smoker    Packs/day: 3.00  . Smokeless tobacco: Never Used  Substance Use Topics  . Alcohol use: Yes    Alcohol/week: 1.2 oz    Types: 2 Glasses of wine per week  . Drug use: No     Allergies   Prednisone   Review of Systems Review of Systems  All other systems reviewed and are negative.    Physical Exam Updated Vital Signs BP (!) 85/67   Pulse 77   Temp 98.1 F (36.7 C) (Temporal)   Ht 1.549 m (5\' 1" )   Wt 56.2 kg (124 lb)   LMP  (LMP Unknown)   SpO2 (S) 91%   BMI 23.43 kg/m   Physical Exam  Constitutional: She is oriented to person, place, and time. She appears well-developed and well-nourished.  Non-toxic appearance. No distress.  HENT:  Head: Normocephalic and atraumatic.  Eyes: Pupils are equal, round, and reactive to light. Conjunctivae, EOM and lids are normal.  Neck: Normal range of motion. Neck supple. No tracheal deviation present. No thyroid mass present.  Cardiovascular: Normal rate, regular rhythm and normal heart sounds. Exam reveals no gallop.  No murmur heard. Pulmonary/Chest: Effort normal. No stridor. No respiratory distress. She has  decreased breath sounds in the right lower field and the left lower field. She has no wheezes. She has rhonchi in the right lower field and the left lower field. She has no rales.  Abdominal: Soft. Normal appearance and bowel sounds are normal. She exhibits no distension. There is no tenderness. There is no rebound and no CVA tenderness.  Musculoskeletal: Normal range of motion. She exhibits no edema or tenderness.  Neurological: She is alert and oriented to person, place, and time. She has normal strength. No cranial nerve deficit or sensory deficit. GCS eye subscore is 4. GCS verbal subscore is 5. GCS motor subscore is 6.  Skin: Skin is warm and dry. No abrasion and no rash noted.  Psychiatric: She has a normal mood and affect. Her speech is normal and behavior is normal.  Nursing note and vitals reviewed.    ED Treatments / Results  Labs (all labs ordered are listed, but only abnormal results are displayed) Labs Reviewed  CBC  COMPREHENSIVE METABOLIC PANEL  BRAIN NATRIURETIC PEPTIDE  TROPONIN I    EKG EKG Interpretation  Date/Time:  Thursday June 20 2017 16:30:52 EDT Ventricular Rate:  69 PR Interval:    QRS Duration: 166 QT Interval:  457 QTC Calculation: 490 R Axis:   -105 Text Interpretation:  Atrial flutter with predominant 4:1 AV block Right bundle branch block Anterior infarct, old Lateral leads are also involved Reconfirmed by Lacretia Leigh ([id number]) on 06/20/2017 4:36:08 PM   Radiology No results found.  Procedures Procedures (including critical care time)  Medications Ordered in ED Medications - No data to display   Initial Impression / Assessment and Plan / ED Course  I have reviewed the triage vital signs and the nursing notes.  Pertinent labs & imaging results that were available during my care of  the patient were reviewed by me and considered in my medical decision making (see chart for details).     Serial troponins show increasing troponin value.  She  continues to have chest pain.  Was hypotensive suspect this is from volume depletion.  Responded well to IV fluids.  Mentating appropriately.  Case discussed with cardiology who will see the patient in consultation.  BNP mildly elevated.  Chest x-ray without infiltrate.  No evidence of edema.  Creatinine is elevated.  Recommend hospitalist admission.  Spoke with Dr. Porfirio Mylar, see the patient  CRITICAL CARE Performed by: Leota Jacobsen Total critical care time: 55 minutes Critical care time was exclusive of separately billable procedures and treating other patients. Critical care was necessary to treat or prevent imminent or life-threatening deterioration. Critical care was time spent personally by me on the following activities: development of treatment plan with patient and/or surrogate as well as nursing, discussions with consultants, evaluation of patient's response to treatment, examination of patient, obtaining history from patient or surrogate, ordering and performing treatments and interventions, ordering and review of laboratory studies, ordering and review of radiographic studies, pulse oximetry and re-evaluation of patient's condition.   Final Clinical Impressions(s) / ED Diagnoses   Final diagnoses:  None    ED Discharge Orders    None       Lacretia Leigh, MD 06/20/17 [id number]

## 2017-06-20 NOTE — ED Notes (Signed)
RN went to [place] to call Pt. Pt no response

## 2017-06-20 NOTE — ED Notes (Signed)
[employer] rep @ bedside to interrogate pacemaker.

## 2017-06-20 NOTE — ED Notes (Signed)
Per Lab Pt's Trop 0.03

## 2017-06-20 NOTE — ED Triage Notes (Signed)
Patient from local store after having a syncopal episode while in the store.  Patient stated she went to the car for an inhaler due to being short of breath.  Wheezing throughout so EMS gave 10 mg albuterol and 0.5mg  Atrovent for the shortness of breath. Initially A Fib then pacemaker took over.  [employer] PACEMAKER.

## 2017-06-20 NOTE — ED Notes (Signed)
Zenia Resides, MD given [facility] Jude pacemaker results.

## 2017-06-21 ENCOUNTER — Observation Stay (HOSPITAL_COMMUNITY): Payer: 59

## 2017-06-21 ENCOUNTER — Encounter (HOSPITAL_COMMUNITY): Payer: Self-pay

## 2017-06-21 ENCOUNTER — Observation Stay (HOSPITAL_BASED_OUTPATIENT_CLINIC_OR_DEPARTMENT_OTHER): Payer: 59

## 2017-06-21 DIAGNOSIS — R945 Abnormal results of liver function studies: Secondary | ICD-10-CM | POA: Diagnosis present

## 2017-06-21 DIAGNOSIS — R7989 Other specified abnormal findings of blood chemistry: Secondary | ICD-10-CM | POA: Diagnosis present

## 2017-06-21 DIAGNOSIS — I340 Nonrheumatic mitral (valve) insufficiency: Secondary | ICD-10-CM | POA: Diagnosis not present

## 2017-06-21 DIAGNOSIS — I480 Paroxysmal atrial fibrillation: Secondary | ICD-10-CM | POA: Diagnosis not present

## 2017-06-21 DIAGNOSIS — I351 Nonrheumatic aortic (valve) insufficiency: Secondary | ICD-10-CM

## 2017-06-21 DIAGNOSIS — I421 Obstructive hypertrophic cardiomyopathy: Secondary | ICD-10-CM | POA: Diagnosis not present

## 2017-06-21 DIAGNOSIS — N179 Acute kidney failure, unspecified: Secondary | ICD-10-CM | POA: Diagnosis not present

## 2017-06-21 DIAGNOSIS — N183 Chronic kidney disease, stage 3 (moderate): Secondary | ICD-10-CM | POA: Diagnosis not present

## 2017-06-21 DIAGNOSIS — R55 Syncope and collapse: Secondary | ICD-10-CM | POA: Diagnosis not present

## 2017-06-21 DIAGNOSIS — J449 Chronic obstructive pulmonary disease, unspecified: Secondary | ICD-10-CM | POA: Diagnosis not present

## 2017-06-21 DIAGNOSIS — I5042 Chronic combined systolic (congestive) and diastolic (congestive) heart failure: Secondary | ICD-10-CM | POA: Diagnosis not present

## 2017-06-21 DIAGNOSIS — I10 Essential (primary) hypertension: Secondary | ICD-10-CM | POA: Diagnosis not present

## 2017-06-21 LAB — URINALYSIS, ROUTINE W REFLEX MICROSCOPIC
Bilirubin Urine: NEGATIVE
Glucose, UA: NEGATIVE mg/dL
Hgb urine dipstick: NEGATIVE
KETONES UR: NEGATIVE mg/dL
Nitrite: NEGATIVE
PH: 5 (ref 5.0–8.0)
PROTEIN: NEGATIVE mg/dL
Specific Gravity, Urine: 1.015 (ref 1.005–1.030)

## 2017-06-21 LAB — TROPONIN I
TROPONIN I: 0.04 ng/mL — AB (ref <0.03)
Troponin I: 0.06 ng/mL (ref <0.03)
Troponin I: 0.05 ng/mL (ref <0.03)

## 2017-06-21 LAB — LIPID PANEL
Cholesterol: 179 mg/dL (ref 0–200)
HDL: 65 mg/dL (ref >40)
LDL Cholesterol: 106 mg/dL — ABNORMAL HIGH (ref 0–99)
Total CHOL/HDL Ratio: 2.8 RATIO
Triglycerides: 42 mg/dL (ref <150)
VLDL: 8 mg/dL (ref 0–40)

## 2017-06-21 LAB — BASIC METABOLIC PANEL
ANION GAP: 10 (ref 5–15)
BUN: 29 mg/dL — ABNORMAL HIGH (ref 6–20)
CHLORIDE: 110 mmol/L (ref 101–111)
CO2: 19 mmol/L — AB (ref 22–32)
Calcium: 8 mg/dL — ABNORMAL LOW (ref 8.9–10.3)
Creatinine, Ser: 1.21 mg/dL — ABNORMAL HIGH (ref 0.44–1.00)
GFR calc non Af Amer: 46 mL/min — ABNORMAL LOW (ref >60)
GFR, EST AFRICAN AMERICAN: 53 mL/min — AB (ref >60)
GLUCOSE: 190 mg/dL — AB (ref 65–99)
Potassium: 4.1 mmol/L (ref 3.5–5.1)
Sodium: 139 mmol/L (ref 135–145)

## 2017-06-21 LAB — CBC
HCT: 36.3 % (ref 36.0–46.0)
HEMOGLOBIN: 11.9 g/dL — AB (ref 12.0–15.0)
MCH: 32.8 pg (ref 26.0–34.0)
MCHC: 32.8 g/dL (ref 30.0–36.0)
MCV: 100 fL (ref 78.0–100.0)
Platelets: 158 10*3/uL (ref 150–400)
RBC: 3.63 MIL/uL — AB (ref 3.87–5.11)
RDW: 14.3 % (ref 11.5–15.5)
WBC: 7.5 10*3/uL (ref 4.0–10.5)

## 2017-06-21 LAB — HEPATITIS PANEL, ACUTE
HCV Ab: <0.1 s/co ratio (ref 0.0–0.9)
HEP A IGM: NEGATIVE
HEP B C IGM: NEGATIVE
HEP B S AG: NEGATIVE

## 2017-06-21 LAB — CORTISOL-AM, BLOOD: CORTISOL - AM: 5.2 ug/dL — AB (ref 6.7–22.6)

## 2017-06-21 LAB — HEMOGLOBIN A1C
Hgb A1c MFr Bld: 5.7 % — ABNORMAL HIGH (ref 4.8–5.6)
Mean Plasma Glucose: 116.89 mg/dL

## 2017-06-21 LAB — HIV ANTIBODY (ROUTINE TESTING W REFLEX): HIV Screen 4th Generation wRfx: NONREACTIVE

## 2017-06-21 LAB — ECHOCARDIOGRAM COMPLETE
HEIGHTINCHES: 61 in
WEIGHTICAEL: 2054.4 [oz_av]

## 2017-06-21 LAB — ACETAMINOPHEN LEVEL

## 2017-06-21 MED ORDER — ONDANSETRON HCL 4 MG/2ML IJ SOLN
4.0000 mg | Freq: Three times a day (TID) | INTRAMUSCULAR | Status: DC | PRN
  Administered 2017-06-22: 4 mg via INTRAVENOUS
  Filled 2017-06-21 (×2): qty 2

## 2017-06-21 MED ORDER — DM-GUAIFENESIN ER 30-600 MG PO TB12
1.0000 | ORAL_TABLET | Freq: Two times a day (BID) | ORAL | Status: DC | PRN
  Filled 2017-06-21: qty 1

## 2017-06-21 MED ORDER — ALBUTEROL SULFATE (2.5 MG/3ML) 0.083% IN NEBU
2.5000 mg | INHALATION_SOLUTION | RESPIRATORY_TRACT | Status: DC | PRN
  Filled 2017-06-21: qty 3

## 2017-06-21 MED ORDER — LORATADINE 10 MG PO TABS
10.0000 mg | ORAL_TABLET | Freq: Every day | ORAL | Status: DC
  Administered 2017-06-21 – 2017-06-24 (×4): 10 mg via ORAL
  Filled 2017-06-21 (×4): qty 1

## 2017-06-21 MED ORDER — FLUOXETINE HCL 20 MG PO CAPS
20.0000 mg | ORAL_CAPSULE | Freq: Every day | ORAL | Status: DC
  Administered 2017-06-21 – 2017-06-24 (×4): 20 mg via ORAL
  Filled 2017-06-21 (×4): qty 1

## 2017-06-21 MED ORDER — FLUTICASONE PROPIONATE 50 MCG/ACT NA SUSP: 2.0000 | Freq: Every day | NASAL | Status: DC | PRN

## 2017-06-21 MED ORDER — DICLOFENAC SODIUM 1 % TD GEL: 2.0000 g | Freq: Four times a day (QID) | TRANSDERMAL | Status: DC | PRN

## 2017-06-21 MED ORDER — CYCLOBENZAPRINE HCL 5 MG PO TABS: 5.0000 mg | ORAL_TABLET | Freq: Three times a day (TID) | ORAL | Status: DC | PRN

## 2017-06-21 MED ORDER — SODIUM CHLORIDE 0.9% FLUSH
3.0000 mL | Freq: Two times a day (BID) | INTRAVENOUS | Status: DC
  Administered 2017-06-21 – 2017-06-23 (×6): 3 mL via INTRAVENOUS

## 2017-06-21 MED ORDER — APIXABAN 5 MG PO TABS
5.0000 mg | ORAL_TABLET | Freq: Two times a day (BID) | ORAL | Status: DC
  Administered 2017-06-21 (×3): 5 mg via ORAL
  Filled 2017-06-21 (×3): qty 1

## 2017-06-21 MED ORDER — AMITRIPTYLINE HCL 10 MG PO TABS
10.0000 mg | ORAL_TABLET | Freq: Every day | ORAL | Status: DC | PRN
  Filled 2017-06-21: qty 1

## 2017-06-21 MED ORDER — IPRATROPIUM-ALBUTEROL 0.5-2.5 (3) MG/3ML IN SOLN
3.0000 mL | Freq: Four times a day (QID) | RESPIRATORY_TRACT | Status: DC
  Administered 2017-06-21 (×2): 3 mL via RESPIRATORY_TRACT
  Filled 2017-06-21 (×3): qty 3

## 2017-06-21 MED ORDER — ALPRAZOLAM 0.25 MG PO TABS
0.2500 mg | ORAL_TABLET | Freq: Two times a day (BID) | ORAL | Status: DC | PRN
  Administered 2017-06-22: 0.25 mg via ORAL
  Filled 2017-06-21: qty 1

## 2017-06-21 MED ORDER — ZOLPIDEM TARTRATE 5 MG PO TABS: 5.0000 mg | ORAL_TABLET | Freq: Every evening | ORAL | Status: DC | PRN

## 2017-06-21 MED ORDER — MOMETASONE FURO-FORMOTEROL FUM 200-5 MCG/ACT IN AERO
2.0000 | INHALATION_SPRAY | Freq: Two times a day (BID) | RESPIRATORY_TRACT | Status: DC
  Administered 2017-06-21 (×2): 2 via RESPIRATORY_TRACT
  Filled 2017-06-21: qty 8.8

## 2017-06-21 MED ORDER — HYDRALAZINE HCL 20 MG/ML IJ SOLN: 5.0000 mg | INTRAMUSCULAR | Status: DC | PRN

## 2017-06-21 MED ORDER — SODIUM CHLORIDE 0.9 % IV SOLN
INTRAVENOUS | Status: AC
  Administered 2017-06-21: 10:00:00 via INTRAVENOUS

## 2017-06-21 MED ORDER — NITROGLYCERIN 0.4 MG SL SUBL: 0.4000 mg | SUBLINGUAL_TABLET | SUBLINGUAL | Status: DC | PRN

## 2017-06-21 MED ORDER — NICOTINE 14 MG/24HR TD PT24: 14.0000 mg | MEDICATED_PATCH | Freq: Every day | TRANSDERMAL | Status: DC | PRN

## 2017-06-21 MED ORDER — MORPHINE SULFATE (PF) 4 MG/ML IV SOLN: 2.0000 mg | INTRAVENOUS | Status: DC | PRN

## 2017-06-21 MED ORDER — CARVEDILOL 6.25 MG PO TABS
6.2500 mg | ORAL_TABLET | Freq: Two times a day (BID) | ORAL | Status: DC
  Administered 2017-06-21 – 2017-06-24 (×4): 6.25 mg via ORAL
  Filled 2017-06-21 (×4): qty 1

## 2017-06-21 MED ORDER — SODIUM CHLORIDE 0.9 % IV SOLN
INTRAVENOUS | Status: DC
  Administered 2017-06-21: 02:00:00 via INTRAVENOUS

## 2017-06-21 MED ORDER — TOPIRAMATE 25 MG PO TABS
50.0000 mg | ORAL_TABLET | Freq: Every day | ORAL | Status: DC
  Administered 2017-06-21 – 2017-06-24 (×4): 50 mg via ORAL
  Filled 2017-06-21 (×4): qty 2

## 2017-06-21 MED ORDER — ASPIRIN 81 MG PO CHEW
81.0000 mg | CHEWABLE_TABLET | Freq: Every day | ORAL | Status: DC
  Administered 2017-06-21 – 2017-06-24 (×4): 81 mg via ORAL
  Filled 2017-06-21 (×4): qty 1

## 2017-06-21 MED ORDER — SUMATRIPTAN SUCCINATE 50 MG PO TABS
50.0000 mg | ORAL_TABLET | Freq: Two times a day (BID) | ORAL | Status: DC | PRN
  Filled 2017-06-21: qty 1

## 2017-06-21 NOTE — Consult Note (Addendum)
The patient has been seen in conjunction with Velna Ochs, MD. All aspects of care have been considered and discussed. The patient has been personally interviewed, examined, and all clinical data has been reviewed.   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  During evaluation, discovered torsemide dose is higher than previously recommended by Dr. Haroldine Laws (taking 40 mg twice daily rather than 20 mg twice daily), and kidney function suggests volume contraction.  The combination of hypertrophic cardiomyopathy, paroxysmal A. Fib, and volume contraction could lead to syncope/near syncope.  Recommend serial cardiac markers to exclude infarction.  Troponin levels have been flat and do not suggest a primary ischemic event.  Decrease torsemide intensity back to 20 mg twice daily.  May eventually have to address whether paroxysmal atrial fibrillation needs management other than beta-blocker therapy that may control frequency of episodes.  Device telemetry or prolonged monitoring will help determine.  Patient should be able to follow-up with both Dr. Caryl Comes and Dr. Haroldine Laws as previously scheduled.  Cardiology Consultation:   Patient ID: Angela Shepherd; [id number]; [birth year]   Admit date: 06/20/2017 Date of Consult: 06/21/2017  Primary Care Provider: Midge Minium, MD Primary Cardiologist: Dr. Haroldine Laws  Primary Electrophysiologist:  Dr. Rayann Heman   Patient Profile:   Angela Shepherd is a 67 y.o. female with a hx of chronic combined systolic and diastolic heart failure (Last Echo @ [facility] 01/2017 with EF 35-40%), HOCM, PAF (on Eliquis), HTN, COPD, CKD III who is being seen today for the evaluation of syncope at the request of  Dr. Blaine Hamper.  History of Present Illness:   Angela Shepherd reports 7 episodes of recurrent syncope / near syncope since August. Her last two episodes were in January and February of this year. She has been following with her PCP, cardiologist, and pulmonologist for this problem. She expresses frustration that a clear etiology has not been identified. She lives in [place] and is normally hospitalized outside of [place]. After her last episode in February, she was seen by her PCP who felt that her episodes may be caused by flash pulmonary edema +/- panic attacks. She reports Dr. Haroldine Laws agreed. Her spironolactone was increased from 12.5 mg to 25 mg daily, her lasix was changed to torsemide, and she was given a prescription for prn xanax. Patient had been doing very well until yesterday, almost a month since her last episode. She was out running errands when she suddenly felt very short of breath and felt that she could not breath. She was sitting in her car and used her PRN albuterol inhaler, took 1/2 of a xanax tablet, and spent a few minutes breathing in and out of a paper bag. She attempted to walk into the drug store but again suddenly felt that she could not breath or talk and laid down on the floor. EMS was called. She denies LOC but does not remember much until arriving at the [facility] ED.  On arrival to the ED, patient reports she felt fine and back to her baseline. She wanted to be discharged home, but was found to be hypotensive with BP 71/57, HR 63, RR 21, and oxygen 92% on RA. EKG demonstrated atrial flutter with 4:1 block, ventricular rate of 70, and chronic RBBB. Labs significant for CMP with acute on chronic  renal dysfunction (Scr 1.5, GFR 35-40 from baseline Scr 1.2, GFR 48-55), mild elevation of AST & ALT (117 & 71), normal sodium 138, normal potassium 4.7, and bicarb 18. CBC was unremarkable. BNP was mildly elevated 781 (unchanged from 1 month prior). Troponin was mildly elevated 0.03 and peaked at 0.06. CT  head negative for acute abnormality.   Of note, patient has a history of hypertrophic cardiomyopathy and bradycardia / Mobitz II s/p  PPM in the 1994. She upgraded to [place] CRT-P in may of 2014 after being seen by EP and found that she was 99% RV paced due to worsening LV function. She was diagnosed with atrial fibrillation this past fall after being picked up incidentally on her pacemaker. She has been on Eliquis since.   Patient reports she felt well prior to this episode. She denies N/V/D, fevers, recent illness, cough, and chest pain. She has not missed any doses of her medication, although there is some discrepancy with her torsemide dosing. She reports taking two 20 mg tablets twice daily instead of the prescribed one tablet BID.   Past Medical History:  Diagnosis Date  . Bradycardia    a. initial PPM placed 1994 at [facility] with gen change 2002. b. 1*AVB & intermittent 2nd degree AVB + CHF --> upgrade to [facility] Jude BiV PPM 05/08/12.  . Chronic systolic CHF (congestive heart failure) ([place])    a. EF 30-35% dx in 2011 -> most recent 40-45% by echo 04/2012. b. s/p upgrade to BiV-PPM 05/08/12. c. Med titration limited by hypotension.  . Cluster headache syndrome   . Hypertr obst cardiomyop   . Hypokalemia   . Hypotension   . Paroxysmal atrial fibrillation (HCC)    a. identified on PPM interrogation b. longest episode 1 hour; if burden increases, will need Regino Ramirez   . Pneumonia 2008    Past Surgical History:  Procedure Laterality Date  . BI-VENTRICULAR PACEMAKER UPGRADE N/A 05/08/2012   upgrade to [facility] (CRT-P) by Dr Rayann Heman  . PACEMAKER INSERTION  1994; 2002; 05/08/2012  . TONSILLECTOMY  ~ 1959  . TUBAL LIGATION  1984?     Home Medications:  Prior to Admission medications   Medication Sig Start Date End Date Taking? Authorizing Provider  albuterol (PROVENTIL HFA;VENTOLIN HFA) 108 (90 Base) MCG/ACT inhaler Inhale 1-2 puffs into the lungs every 6 (six) hours as needed for wheezing or  shortness of breath. 07/30/16  Yes Byrum, Rose Fillers, MD  ALPRAZolam Duanne Moron) 0.25 MG tablet Take 1 tablet (0.25 mg total) by mouth 2 (two) times daily as needed for anxiety. 03/25/17  Yes Midge Minium, MD  AMBULATORY NON FORMULARY MEDICATION Medication Name: 100% O2 15 leters a min for 15-20 mins  Via Nasal cannual Patient taking differently: Medication Name: 100% O2 15 leters a min for 15-20 mins as needed  Via Nasal cannual 07/13/14  Yes Jaffe, Adam R, DO  amitriptyline (ELAVIL) 10 MG tablet Take 10 mg by mouth as needed (headaches).    Yes [provider]  apixaban (ELIQUIS) 5 MG TABS tablet Take 1 tablet (5 mg total) by mouth 2 (two) times daily. 11/28/16  Yes Bensimhon, Shaune Pascal, MD  aspirin 81 MG chewable tablet Chew 81 mg by mouth daily.   Yes [provider]  carvedilol (COREG) 12.5 MG tablet TAKE 1 TABLET BY MOUTH 2 TIMES DAILY WITH A MEAL. 05/08/17  Yes Bensimhon, Shaune Pascal, MD  cyclobenzaprine (FLEXERIL) 5 MG tablet TAKE 1 TABLET (5 MG TOTAL) BY MOUTH  3 (THREE) TIMES DAILY AS NEEDED FOR MUSCLE SPASMS. 04/18/15  Yes Midge Minium, MD  diclofenac sodium (VOLTAREN) 1 % GEL Apply 2 g topically 4 (four) times daily. Patient taking differently: Apply 2 g topically 4 (four) times daily as needed (pain).  06/12/16  Yes Hudnall, Sharyn Lull, MD  FLUoxetine (PROZAC) 20 MG capsule Take 1 capsule (20 mg total) by mouth daily. 03/25/17  Yes Midge Minium, MD  fluticasone (FLONASE) 50 MCG/ACT nasal spray PLACE 2 SPRAYS INTO BOTH NOSTRILS DAILY. Patient taking differently: Place 2 sprays into both nostrils daily as needed for allergies.  08/23/16  Yes Byrum, Rose Fillers, MD  ipratropium-albuterol (DUONEB) 0.5-2.5 (3) MG/3ML SOLN USE 1 VIAL (3ML) BY NEBULIZER EVERY 4 TO 6 HOURS AS NEEDED Patient taking differently: Take 3 mLs by nebulization every 4 (four) hours as needed (shortness of breath).  02/28/17  Yes Midge Minium, MD  loratadine (CLARITIN) 10 MG tablet Take 1 tablet (10 mg  total) by mouth daily. 03/15/15  Yes Collene Gobble, MD  losartan (COZAAR) 25 MG tablet Take 1 tablet (25 mg total) by mouth 2 (two) times daily. 05/17/17 08/15/17 Yes Bensimhon, Shaune Pascal, MD  nicotine (NICODERM CQ - DOSED IN MG/24 HOURS) 14 mg/24hr patch Place 1 patch (14 mg total) onto the skin daily. Patient taking differently: Place 14 mg onto the skin daily as needed (nicotine withdrawal).  04/13/14  Yes Reyne Dumas, MD  potassium chloride SA (K-DUR,KLOR-CON) 20 MEQ tablet Take 1 tablet (20 mEq total) by mouth daily. 09/19/16  Yes Clegg, Amy D, NP  Spacer/Aero-Holding Chambers (AEROCHAMBER MV) inhaler Use as instructed with Symbicort 03/15/15  Yes Byrum, Rose Fillers, MD  spironolactone (ALDACTONE) 25 MG tablet Take 1 tablet (25 mg total) by mouth daily. 03/12/17  Yes Shirley Friar, PA-C  SUMAtriptan (IMITREX) 50 MG tablet TAKE 1 TABLET AT ONSET OF HEADACHE. MAY REPEAT IN 2 HOURS IF HEADACHE PERSISTS OR RECURS. Patient taking differently: TAKE 1 TABLET (50MG ) AS NEEDED AT ONSET OF HEADACHE. MAY REPEAT IN 2 HOURS IF HEADACHE PERSISTS OR RECURS. 10/17/15  Yes Midge Minium, MD  SYMBICORT 160-4.5 MCG/ACT inhaler INHALE 2 PUFFS BY MOUTH INTO THE LUNGS 2 TIMES A DAY 02/25/17  Yes Midge Minium, MD  topiramate (TOPAMAX) 50 MG tablet Take 50 mg by mouth daily.   Yes [provider]  torsemide (DEMADEX) 20 MG tablet Take 1 tablet (20 mg total) by mouth 2 (two) times daily. Patient taking differently: Take 40 mg by mouth 2 (two) times daily.  01/30/17 05/18/18 Yes Bensimhon, Shaune Pascal, MD    Inpatient Medications: Scheduled Meds: . apixaban  5 mg Oral BID  . aspirin  81 mg Oral Daily  . FLUoxetine  20 mg Oral Daily  . ipratropium-albuterol  3 mL Nebulization Q6H  . loratadine  10 mg Oral Daily  . mometasone-formoterol  2 puff Inhalation BID  . sodium chloride flush  3 mL Intravenous Q12H  . topiramate  50 mg Oral Daily   Continuous Infusions: . sodium chloride 50 mL/hr at 06/21/17  1002   PRN Meds: albuterol, ALPRAZolam, amitriptyline, cyclobenzaprine, dextromethorphan-guaiFENesin, diclofenac sodium, fluticasone, hydrALAZINE, morphine injection, nicotine, nitroGLYCERIN, ondansetron (ZOFRAN) IV, SUMAtriptan, zolpidem  Allergies:    Allergies  Allergen Reactions  . Prednisone Other (See Comments)    SOB, CHF     Social History:   Social History   Socioeconomic History  . Marital status: Married    Spouse name: Not on file  .  Number of children: 2  . Years of education: Not on file  . Highest education level: Not on file  Occupational History  . Occupation: [employer]    Employer: [employer]  . Financial resource strain: Not on file  . Food insecurity:    Worry: Not on file    Inability: Not on file  . Transportation needs:    Medical: Not on file    Non-medical: Not on file  Tobacco Use  . Smoking status: Former Smoker    Packs/day: 3.00  . Smokeless tobacco: Never Used  Substance and Sexual Activity  . Alcohol use: Yes    Alcohol/week: 1.2 oz    Types: 2 Glasses of wine per week  . Drug use: No  . Sexual activity: Never  Lifestyle  . Physical activity:    Days per week: Not on file    Minutes per session: Not on file  . Stress: Not on file  Relationships  . Social connections:    Talks on phone: Not on file    Gets together: Not on file    Attends religious service: Not on file    Active member of club or organization: Not on file    Attends meetings of clubs or organizations: Not on file    Relationship status: Not on file  . Intimate partner violence:    Fear of current or ex partner: Not on file    Emotionally abused: Not on file    Physically abused: Not on file    Forced sexual activity: Not on file  Other Topics Concern  . Not on file  Social History Narrative   ** Merged History Encounter **        Family History:    Family History  Problem Relation Age of Onset  . Heart disease Sister     . Dementia Mother   . Cancer Sister 32       uterine  . Diabetes Unknown   . Cardiomyopathy Sister   . Diabetes Brother   . Colon cancer Neg Hx   . Esophageal cancer Neg Hx   . Stomach cancer Neg Hx   . Rectal cancer Neg Hx      ROS:  Please see the history of present illness, otherwise negative.    Physical Exam/Data:   Vitals:   06/21/17 0126 06/21/17 0500 06/21/17 0827 06/21/17 0828  BP: 113/69 (!) 91/54    Pulse: 68 76    Resp:  18    Temp:  98.2 F (36.8 C)    TempSrc:  Oral    SpO2:  96% 97% 97%  Weight:  128 lb 6.4 oz (58.2 kg)    Height:        Intake/Output Summary (Last 24 hours) at 06/21/2017 1221 Last data filed at 06/21/2017 0700 Gross per 24 hour  Intake 1114.58 ml  Output 600 ml  Net 514.58 ml   Filed Weights   06/20/17 1633 06/21/17 0121 06/21/17 0500  Weight: 124 lb (56.2 kg) 127 lb 14.4 oz (58 kg) 128 lb 6.4 oz (58.2 kg)   Body mass index is 24.26 kg/m.  General:  Well nourished, well developed, in no acute distres HEENT: normal Lymph: no adenopathy Neck: no JVD Endocrine:  No thryomegaly Vascular: No carotid bruits; FA pulses 2+ bilaterally without bruits  Cardiac:  normal S1, S2; RRR; no murmur  Lungs:  clear to auscultation bilaterally, no wheezing, rhonchi or rales  Abd:  soft, nontender, no hepatomegaly  Ext: no edema Musculoskeletal:  No deformities, BUE and BLE strength normal and equal Skin: warm and dry  Neuro:  CNs 2-12 intact, no focal abnormalities noted Psych:  Normal affect   EKG:  The EKG was personally reviewed and demonstrates:    (6/13): Admission EKG with atrial flutter 4:1 block with ventricular rate of 70, old RBBB (6/14): Ventricular pacing   Telemetry:  Telemetry was personally reviewed and demonstrates:  Biventricular pacing throughout   Relevant CV Studies:  06/21/2017 Echocardiogram: -- Pending  Laboratory Data:  Chemistry Recent Labs  Lab 06/20/17 1644 06/21/17 0736  NA 138 139  K 4.7 4.1  CL  105 110  CO2 18* 19*  GLUCOSE 225* 190*  BUN 31* 29*  CREATININE 1.53* 1.21*  CALCIUM 8.8* 8.0*  GFRNONAA 35* 46*  GFRAA 40* 53*  ANIONGAP 15 10    Recent Labs  Lab 06/20/17 1644  PROT 6.4*  ALBUMIN 3.9  AST 117*  ALT 71*  ALKPHOS 99  BILITOT 0.9   Hematology Recent Labs  Lab 06/20/17 1644 06/21/17 0736  WBC 4.5 7.5  RBC 4.05 3.63*  HGB 13.4 11.9*  HCT 40.6 36.3  MCV 100.2* 100.0  MCH 33.1 32.8  MCHC 33.0 32.8  RDW 13.9 14.3  PLT 190 158   Cardiac Enzymes Recent Labs  Lab 06/20/17 1644 06/20/17 2106 06/21/17 0143 06/21/17 0736  TROPONINI 0.03* 0.06* 0.06* 0.04*   No results for input(s): TROPIPOC in the last 168 hours.  BNP Recent Labs  Lab 06/20/17 1644  BNP 781.6*    DDimer No results for input(s): DDIMER in the last 168 hours.  Radiology/Studies:  Dg Chest 2 View  Result Date: 06/20/2017 CLINICAL DATA:  Shortness of breath EXAM: CHEST - 2 VIEW COMPARISON:  03/02/2017 FINDINGS: Cardiac shadow is enlarged but stable. Pacing device is again seen and stable. Chronic changes are again noted in the right mid lung. No new focal infiltrate or effusion is seen. No bony abnormality is noted. IMPRESSION: Stable chronic changes.  No acute abnormality is seen. Electronically Signed   By: Inez Catalina M.D.   On: 06/20/2017 17:13   Ct Head Wo Contrast  Result Date: 06/21/2017 CLINICAL DATA:  Syncope EXAM: CT HEAD WITHOUT CONTRAST TECHNIQUE: Contiguous axial images were obtained from the base of the skull through the vertex without intravenous contrast. COMPARISON:  None. FINDINGS: Brain: No evidence of acute infarction, hemorrhage, hydrocephalus, extra-axial collection or mass lesion/mass effect. Vascular: No hyperdense vessel or unexpected calcification. Minimal calcification at the carotid siphons. Skull: Normal. Negative for fracture or focal lesion. Sinuses/Orbits: No acute finding. Other: None IMPRESSION: Negative.  No CT evidence for acute intracranial  abnormality. Electronically Signed   By: Donavan Foil M.D.   On: 06/21/2017 00:58    Assessment and Plan:   Syncope: Unclear etiology, possibly secondary to hypotension and over diuresis given incorrect torsemide dosing. However she was at her dry weight on admission. She was also noted to be in atrial flutter on arrival which could certainly be contributing. Now sinus and ventricularly paced throughout.   -- Stop IVFs  -- Consider antiarrhythmic.  Can be addressed by her electrophysiologist. -- Decrease torsemide dosing back to intended 20 mg BID; can likely resume tomorrow. -- PT/OT  Hypotension: Improved with IVFs. Noted to have low AM cortisol this AM (5.2) concerning for adrenal insufficiency.  -- Consider ACTH stim test per primary -- Slowly restart home spironolactone, torsemide, coreg, and losartan as tolerated  Chronic Combined Systolic and Diastolic Heart Failure: Last echocardi[facility] in Jan 2019 with EF 35-40%, G2DD. BNP is elevated but unchanged from 1 month prior. Per cardiology notes, dry weight is 123-124lbs. She was 124lbs on admission, up today 128 after IVFs.  -- Diuretics held on admission given hypotension  -- Stop IVFs -- Resume torsemide likely tomorrow if BP stable   Elevated Troponin: Mild, peaked at 0.06. Likely demand in the setting of hypotension. Appears troponins are chronically elevated in the past (mild).  Do not recommend ischemic evaluation  Atrial Fibrillation:  -- Continue home Eliquis -- Coreg held due to hypotension  -- Consider antiarrhythmic   Hx Hypertrophic Obstructive Cardiomyopathy:  -- Repeat echo pending   Acute on Chronic Renal Dysfunction: CKD III at baseline. AKI resolved with IVFs.  -- Monitor   Transaminitis: Mild elevation of AST/ ALT. Possibly due to hypoperfusion with hypotension  -- Hepatitis panel pending -- Tylenol level undetectable   For questions or updates, please contact [facility] Please consult  [email or web address] for contact info under Cardiology/STEMI.   Signed, Velna Ochs, MD  06/21/2017 12:21 PM

## 2017-06-21 NOTE — ED Notes (Signed)
Pt currently in CT.

## 2017-06-21 NOTE — Progress Notes (Signed)
  Echocardiogram 2D Echocardiogram has been performed.  Angela Shepherd 06/21/2017, 2:58 PM

## 2017-06-21 NOTE — Plan of Care (Signed)
  Problem: Activity: Goal: Risk for activity intolerance will decrease Outcome: Completed/Met   Problem: Nutrition: Goal: Adequate nutrition will be maintained Outcome: Completed/Met   Problem: Coping: Goal: Level of anxiety will decrease Outcome: Completed/Met   Problem: Elimination: Goal: Will not experience complications related to urinary retention Outcome: Completed/Met   Problem: Pain Managment: Goal: General experience of comfort will improve Outcome: Completed/Met   Problem: Skin Integrity: Goal: Risk for impaired skin integrity will decrease Outcome: Completed/Met   Problem: Education: Goal: Knowledge of General Education information will improve Outcome: Completed/Met   Problem: Activity: Goal: Risk for activity intolerance will decrease Outcome: Completed/Met   Problem: Nutrition: Goal: Adequate nutrition will be maintained Outcome: Completed/Met   Problem: Coping: Goal: Level of anxiety will decrease Outcome: Completed/Met   Problem: Elimination: Goal: Will not experience complications related to urinary retention Outcome: Completed/Met   Problem: Pain Managment: Goal: General experience of comfort will improve Outcome: Completed/Met   Problem: Skin Integrity: Goal: Risk for impaired skin integrity will decrease Outcome: Completed/Met

## 2017-06-21 NOTE — Progress Notes (Signed)
PT Cancellation Note  Patient Details Name: DOLLENE MALLERY MRN: [id number] DOB: 08-28-1950   Cancelled Treatment:     Pt screened with no acute PT needs. Per OT patient is functioning at baseline, patient reports she has no concerns or desire to work with PT today. Will sign off, re order if status changes.   Reinaldo Berber, PT, DPT Acute Rehab Services Pager: [phone]     Reinaldo Berber 06/21/2017, 8:51 PM

## 2017-06-21 NOTE — Evaluation (Addendum)
Occupational Therapy Evaluation and Discharge Patient Details Name: Angela Shepherd MRN: [id number] DOB: Oct 20, 1950 Today's Date: 06/21/2017    History of Present Illness Angela Shepherd is a 67 y.o. female with medical history significant of sCHF with EF of 45%, COPD, PAF on Eliquis, cluster headaches, HOCM, CKD 3, depression, anxiety, who presents with syncope.   Clinical Impression   This 67 yo female admitted with above presents to acute OT at a Mod I to independent level with basic ADLs and mobility, no further OT needs we will sign off. Also made PT aware that no PT needs noted.  BP sitting 107/85       Standing 112/71       Post ambulation 112/70    Follow Up Recommendations  No OT follow up    Equipment Recommendations  None recommended by OT       Precautions / Restrictions Precautions Precautions: Fall Precaution Comments: only due to h/o syncopal episodes Restrictions Weight Bearing Restrictions: No      Mobility Bed Mobility Overal bed mobility: Independent                Transfers Overall transfer level: Independent               General transfer comment: Pt Mod I with stairs using one rail    Balance Overall balance assessment: No apparent balance deficits (not formally assessed)                                         ADL either performed or assessed with clinical judgement   ADL Overall ADL's : Independent                                             Vision Patient Visual Report: No change from baseline              Pertinent Vitals/Pain Pain Assessment: No/denies pain     Hand Dominance Right   Extremity/Trunk Assessment Upper Extremity Assessment Upper Extremity Assessment: Overall WFL for tasks assessed   Lower Extremity Assessment Lower Extremity Assessment: Overall WFL for tasks assessed       Communication Communication Communication: No difficulties   Cognition  Arousal/Alertness: Awake/alert Behavior During Therapy: WFL for tasks assessed/performed Overall Cognitive Status: Within Functional Limits for tasks assessed                                     General Comments  Pt was able to walk up and down hallways looking left and right, up and down without issues            Home Living Family/patient expects to be discharged to:: Private residence Living Arrangements: Alone   Type of Home: House Home Access: Stairs to enter Technical brewer of Steps: 3 Entrance Stairs-Rails: Right Home Layout: One level     Bathroom Shower/Tub: Occupational psychologist: Handicapped height     Home Equipment: Grab bars - tub/shower;Grab bars - toilet;Walker - 2 wheels;Bedside commode          Prior Functioning/Environment Level of Independence: Independent        Comments: works for [employer]  OT Goals(Current goals can be found in the care plan section) Acute Rehab OT Goals Patient Stated Goal: home today  OT Frequency:                AM-PAC PT "6 Clicks" Daily Activity     Outcome Measure Help from another person eating meals?: None Help from another person taking care of personal grooming?: None Help from another person toileting, which includes using toliet, bedpan, or urinal?: None Help from another person bathing (including washing, rinsing, drying)?: None Help from another person to put on and taking off regular upper body clothing?: None Help from another person to put on and taking off regular lower body clothing?: None 6 Click Score: 24   End of Session Equipment Utilized During Treatment: Gait belt  Activity Tolerance: Patient tolerated treatment well Patient left: (washing up at sink)                   Time: 9150-5697 OT Time Calculation (min): 22 min Charges:  OT General Charges $OT Visit: 1 Visit OT Evaluation $OT Eval Moderate Complexity: Angela Shepherd, [place] [phone] 06/21/2017

## 2017-06-21 NOTE — Progress Notes (Addendum)
PROGRESS NOTE    Angela REBEL  Shepherd:[id number] DOB: 1950/04/11 DOA: 06/20/2017 PCP: Midge Minium, MD  Brief Narrative: Angela Shepherd is a 67 y.o. female with medical history significant of chronic systolic CHF EF of 93%, COPD, PAF on Eliquis, cluster headaches, HOCM, CKD 3, depression, anxiety, who presented with syncope. -patient reported, that she suddenly started feeling nauseous, shortness of breath, sweaty when she was in drugstore at about 3:30 PM, afterward she passed out. -She was found to be hypotensive on route BP 70/40, initial BPs in ED in 70s, given NS boluses and then started IVF, diuretics held   Assessment & Plan:   Principal Problem:   Syncope - most likely secondary to hypotension -paced rhythm throughout -suspect this is secondary to overdiuresis/HOCM with reduction in preload likely -per chart review is supposed to be on torsemide 20 mg twice a day but reportedly takes 2 tablets twice a day i.e. 40 mg twice a day, in addition to Aldactone and Losartan twice a day -status post almost 2 L of normal saline now, will stop IV fluids -Continue to hold diuretics today- BP in 90s, restart tomorrow at a lower dose -Cards to evaluate today, FU ECHO     Essential hypertension -antihypertensives on hold due to hypotension/syncope -Restart Coreg first as blood pressure tolerates    Chronic systolic and diastolic CHF -Last echo 02/3555 with EF 45-50% and grade 2 diastolic dysfunction -Clinically compensated, diuretics on hold -Stop IV fluids now   COPD (chronic obstructive pulmonary disease) ([place]): -no wheezing, stable, nebs PRN  Depression and anxiety:  -Stable, continue Xanax, amitriptyline,   minimally elevated troponin -flat trend, no ACS, likely secondary to increased demand from hypotension/HOCM -FU ECHO- h/o HOCM/preload dependent  Atrial Fibrillation:  -CHA2DS2-VASc Score is 3,  -continue  Eliquis  -hold  Coreg  AoCKD-III: Baseline Cre is  1.1-1.3, pt's Cre is 1.53 on admission. Likely due to prerenal secondary to dehydration and continuation of ACEI, diuretics.  ATN is also possible given hypotension. - IVF as above - Follow up renal function by BMP - Hold diuretics  Abnormal LFTs: - AST 117, ALT 71, ALP 99, total bilirubin 0.9.   - May be due to hypoperfusion secondary to hypotension. -recheck tomorrow  DVT ppx:  Eliquis Code Status: Full code Family Communication: None at bed side.   Disposition Plan:  Home pending Cards eval   Consultants:   Cardiology   Procedures:   Antimicrobials:    Subjective: - feels better now, no dyspnea or dizziness  Objective: Vitals:   06/21/17 0126 06/21/17 0500 06/21/17 0827 06/21/17 0828  BP: 113/69 (!) 91/54    Pulse: 68 76    Resp:  18    Temp:  98.2 F (36.8 C)    TempSrc:  Oral    SpO2:  96% 97% 97%  Weight:  58.2 kg (128 lb 6.4 oz)    Height:        Intake/Output Summary (Last 24 hours) at 06/21/2017 1311 Last data filed at 06/21/2017 0700 Gross per 24 hour  Intake 1114.58 ml  Output 600 ml  Net 514.58 ml   Filed Weights   06/20/17 1633 06/21/17 0121 06/21/17 0500  Weight: 56.2 kg (124 lb) 58 kg (127 lb 14.4 oz) 58.2 kg (128 lb 6.4 oz)    Examination:  General exam: Appears calm and comfortable  Respiratory system: CTAB Cardiovascular system: S1 & S2 heard, RRR.  Gastrointestinal system: Abdomen is nondistended, soft and nontender.Normal bowel sounds  heard. Central nervous system: Alert and oriented. No focal neurological deficits. Extremities: Symmetric 5 x 5 power. Skin: No rashes, lesions or ulcers Psychiatry: Judgement and insight appear normal. Mood & affect appropriate.     Data Reviewed:   CBC: Recent Labs  Lab 06/20/17 1644 06/21/17 0736  WBC 4.5 7.5  HGB 13.4 11.9*  HCT 40.6 36.3  MCV 100.2* 100.0  PLT 190 409   Basic Metabolic Panel: Recent Labs  Lab 06/20/17 1644 06/21/17 0736  NA 138 139  K 4.7 4.1  CL 105 110    CO2 18* 19*  GLUCOSE 225* 190*  BUN 31* 29*  CREATININE 1.53* 1.21*  CALCIUM 8.8* 8.0*   GFR: Estimated Creatinine Clearance: 38.1 mL/min (A) (by C-G formula based on SCr of 1.21 mg/dL (H)). Liver Function Tests: Recent Labs  Lab 06/20/17 1644  AST 117*  ALT 71*  ALKPHOS 99  BILITOT 0.9  PROT 6.4*  ALBUMIN 3.9   No results for input(s): LIPASE, AMYLASE in the last 168 hours. No results for input(s): AMMONIA in the last 168 hours. Coagulation Profile: No results for input(s): INR, PROTIME in the last 168 hours. Cardiac Enzymes: Recent Labs  Lab 06/20/17 1644 06/20/17 2106 06/21/17 0143 06/21/17 0736  TROPONINI 0.03* 0.06* 0.06* 0.04*   BNP (last 3 results) No results for input(s): PROBNP in the last 8760 hours. HbA1C: Recent Labs    06/21/17 0143  HGBA1C 5.7*   CBG: No results for input(s): GLUCAP in the last 168 hours. Lipid Profile: Recent Labs    06/21/17 0143  CHOL 179  HDL 65  LDLCALC 106*  TRIG 42  CHOLHDL 2.8   Thyroid Function Tests: No results for input(s): TSH, T4TOTAL, FREET4, T3FREE, THYROIDAB in the last 72 hours. Anemia Panel: No results for input(s): VITAMINB12, FOLATE, FERRITIN, TIBC, IRON, RETICCTPCT in the last 72 hours. Urine analysis:    Component Value Date/Time   COLORURINE YELLOW 06/21/2017 0738   APPEARANCEUR CLEAR 06/21/2017 0738   LABSPEC 1.015 06/21/2017 0738   PHURINE 5.0 06/21/2017 0738   GLUCOSEU NEGATIVE 06/21/2017 0738   HGBUR NEGATIVE 06/21/2017 0738   BILIRUBINUR NEGATIVE 06/21/2017 0738   KETONESUR NEGATIVE 06/21/2017 0738   PROTEINUR NEGATIVE 06/21/2017 0738   UROBILINOGEN 0.2 04/10/2014 1128   NITRITE NEGATIVE 06/21/2017 0738   LEUKOCYTESUR TRACE (A) 06/21/2017 0738   Sepsis Labs: @LABRCNTIP (procalcitonin:4,lacticidven:4)  )No results found for this or any previous visit (from the past 240 hour(s)).       Radiology Studies: Dg Chest 2 View  Result Date: 06/20/2017 CLINICAL DATA:  Shortness of  breath EXAM: CHEST - 2 VIEW COMPARISON:  03/02/2017 FINDINGS: Cardiac shadow is enlarged but stable. Pacing device is again seen and stable. Chronic changes are again noted in the right mid lung. No new focal infiltrate or effusion is seen. No bony abnormality is noted. IMPRESSION: Stable chronic changes.  No acute abnormality is seen. Electronically Signed   By: Inez Catalina M.D.   On: 06/20/2017 17:13   Ct Head Wo Contrast  Result Date: 06/21/2017 CLINICAL DATA:  Syncope EXAM: CT HEAD WITHOUT CONTRAST TECHNIQUE: Contiguous axial images were obtained from the base of the skull through the vertex without intravenous contrast. COMPARISON:  None. FINDINGS: Brain: No evidence of acute infarction, hemorrhage, hydrocephalus, extra-axial collection or mass lesion/mass effect. Vascular: No hyperdense vessel or unexpected calcification. Minimal calcification at the carotid siphons. Skull: Normal. Negative for fracture or focal lesion. Sinuses/Orbits: No acute finding. Other: None IMPRESSION: Negative.  No CT evidence  for acute intracranial abnormality. Electronically Signed   By: Donavan Foil M.D.   On: 06/21/2017 00:58        Scheduled Meds: . apixaban  5 mg Oral BID  . aspirin  81 mg Oral Daily  . FLUoxetine  20 mg Oral Daily  . ipratropium-albuterol  3 mL Nebulization Q6H  . loratadine  10 mg Oral Daily  . mometasone-formoterol  2 puff Inhalation BID  . sodium chloride flush  3 mL Intravenous Q12H  . topiramate  50 mg Oral Daily   Continuous Infusions:   LOS: 0 days    Time spent: 75min    Domenic Polite, MD [facility] Page via [email or web address], password [email or web address] After 7PM please contact night-coverage  06/21/2017, 1:11 PM

## 2017-06-22 ENCOUNTER — Observation Stay (HOSPITAL_COMMUNITY): Payer: 59

## 2017-06-22 DIAGNOSIS — N179 Acute kidney failure, unspecified: Secondary | ICD-10-CM | POA: Diagnosis not present

## 2017-06-22 DIAGNOSIS — I5043 Acute on chronic combined systolic (congestive) and diastolic (congestive) heart failure: Secondary | ICD-10-CM

## 2017-06-22 DIAGNOSIS — I421 Obstructive hypertrophic cardiomyopathy: Secondary | ICD-10-CM | POA: Diagnosis not present

## 2017-06-22 DIAGNOSIS — R079 Chest pain, unspecified: Secondary | ICD-10-CM | POA: Diagnosis not present

## 2017-06-22 DIAGNOSIS — I4510 Unspecified right bundle-branch block: Secondary | ICD-10-CM | POA: Diagnosis present

## 2017-06-22 DIAGNOSIS — J9601 Acute respiratory failure with hypoxia: Secondary | ICD-10-CM | POA: Diagnosis present

## 2017-06-22 DIAGNOSIS — I130 Hypertensive heart and chronic kidney disease with heart failure and stage 1 through stage 4 chronic kidney disease, or unspecified chronic kidney disease: Secondary | ICD-10-CM | POA: Diagnosis present

## 2017-06-22 DIAGNOSIS — I959 Hypotension, unspecified: Secondary | ICD-10-CM | POA: Diagnosis not present

## 2017-06-22 DIAGNOSIS — I952 Hypotension due to drugs: Secondary | ICD-10-CM | POA: Diagnosis present

## 2017-06-22 DIAGNOSIS — I480 Paroxysmal atrial fibrillation: Secondary | ICD-10-CM | POA: Diagnosis not present

## 2017-06-22 DIAGNOSIS — N183 Chronic kidney disease, stage 3 (moderate): Secondary | ICD-10-CM | POA: Diagnosis not present

## 2017-06-22 DIAGNOSIS — I4892 Unspecified atrial flutter: Secondary | ICD-10-CM | POA: Diagnosis present

## 2017-06-22 DIAGNOSIS — I9589 Other hypotension: Secondary | ICD-10-CM | POA: Diagnosis present

## 2017-06-22 DIAGNOSIS — Z87891 Personal history of nicotine dependence: Secondary | ICD-10-CM | POA: Diagnosis not present

## 2017-06-22 DIAGNOSIS — J449 Chronic obstructive pulmonary disease, unspecified: Secondary | ICD-10-CM | POA: Diagnosis not present

## 2017-06-22 DIAGNOSIS — R748 Abnormal levels of other serum enzymes: Secondary | ICD-10-CM | POA: Diagnosis not present

## 2017-06-22 DIAGNOSIS — E860 Dehydration: Secondary | ICD-10-CM | POA: Diagnosis not present

## 2017-06-22 DIAGNOSIS — R0602 Shortness of breath: Secondary | ICD-10-CM | POA: Diagnosis not present

## 2017-06-22 DIAGNOSIS — D509 Iron deficiency anemia, unspecified: Secondary | ICD-10-CM | POA: Diagnosis present

## 2017-06-22 DIAGNOSIS — J441 Chronic obstructive pulmonary disease with (acute) exacerbation: Secondary | ICD-10-CM | POA: Diagnosis present

## 2017-06-22 DIAGNOSIS — T501X5A Adverse effect of loop [high-ceiling] diuretics, initial encounter: Secondary | ICD-10-CM | POA: Diagnosis present

## 2017-06-22 DIAGNOSIS — R740 Nonspecific elevation of levels of transaminase and lactic acid dehydrogenase [LDH]: Secondary | ICD-10-CM | POA: Diagnosis present

## 2017-06-22 DIAGNOSIS — E876 Hypokalemia: Secondary | ICD-10-CM | POA: Diagnosis not present

## 2017-06-22 DIAGNOSIS — J9602 Acute respiratory failure with hypercapnia: Secondary | ICD-10-CM | POA: Diagnosis present

## 2017-06-22 DIAGNOSIS — R55 Syncope and collapse: Secondary | ICD-10-CM | POA: Diagnosis not present

## 2017-06-22 DIAGNOSIS — R0603 Acute respiratory distress: Secondary | ICD-10-CM | POA: Diagnosis not present

## 2017-06-22 DIAGNOSIS — F419 Anxiety disorder, unspecified: Secondary | ICD-10-CM | POA: Diagnosis not present

## 2017-06-22 DIAGNOSIS — I248 Other forms of acute ischemic heart disease: Secondary | ICD-10-CM | POA: Diagnosis present

## 2017-06-22 DIAGNOSIS — M810 Age-related osteoporosis without current pathological fracture: Secondary | ICD-10-CM | POA: Diagnosis present

## 2017-06-22 DIAGNOSIS — F329 Major depressive disorder, single episode, unspecified: Secondary | ICD-10-CM | POA: Diagnosis not present

## 2017-06-22 DIAGNOSIS — I10 Essential (primary) hypertension: Secondary | ICD-10-CM | POA: Diagnosis not present

## 2017-06-22 DIAGNOSIS — I5042 Chronic combined systolic (congestive) and diastolic (congestive) heart failure: Secondary | ICD-10-CM | POA: Diagnosis not present

## 2017-06-22 LAB — POCT I-STAT 3, ART BLOOD GAS (G3+)
ACID-BASE DEFICIT: 7 mmol/L — AB (ref 0.0–2.0)
Bicarbonate: 19 mmol/L — ABNORMAL LOW (ref 20.0–28.0)
O2 Saturation: 96 %
TCO2: 20 mmol/L — AB (ref 22–32)
pCO2 arterial: 38.7 mmHg (ref 32.0–48.0)
pH, Arterial: 7.3 — ABNORMAL LOW (ref 7.350–7.450)
pO2, Arterial: 89 mmHg (ref 83.0–108.0)

## 2017-06-22 LAB — BLOOD GAS, ARTERIAL
Acid-base deficit: 10.5 mmol/L — ABNORMAL HIGH (ref 0.0–2.0)
Acid-base deficit: 7.7 mmol/L — ABNORMAL HIGH (ref 0.0–2.0)
Bicarbonate: 17.7 mmol/L — ABNORMAL LOW (ref 20.0–28.0)
Bicarbonate: 20.9 mmol/L (ref 20.0–28.0)
DRAWN BY: 426241
Drawn by: 42624
FIO2: 1
O2 SAT: 80.2 %
O2 Saturation: 89.9 %
PCO2 ART: 73.5 mmHg — AB (ref 32.0–48.0)
PH ART: 7.082 — AB (ref 7.350–7.450)
PO2 ART: 80.4 mmHg — AB (ref 83.0–108.0)
Patient temperature: 98.6
Patient temperature: 98.6
pCO2 arterial: 60.9 mmHg — ABNORMAL HIGH (ref 32.0–48.0)
pH, Arterial: 7.091 — CL (ref 7.350–7.450)
pO2, Arterial: 60.3 mmHg — ABNORMAL LOW (ref 83.0–108.0)

## 2017-06-22 LAB — GLUCOSE, CAPILLARY
GLUCOSE-CAPILLARY: 99 mg/dL (ref 65–99)
GLUCOSE-CAPILLARY: 125 mg/dL — AB (ref 65–99)
Glucose-Capillary: 108 mg/dL — ABNORMAL HIGH (ref 65–99)
Glucose-Capillary: 115 mg/dL — ABNORMAL HIGH (ref 65–99)
Glucose-Capillary: 139 mg/dL — ABNORMAL HIGH (ref 65–99)
Glucose-Capillary: 99 mg/dL (ref 65–99)

## 2017-06-22 LAB — BASIC METABOLIC PANEL
Anion gap: 9 (ref 5–15)
BUN: 30 mg/dL — AB (ref 6–20)
CO2: 21 mmol/L — ABNORMAL LOW (ref 22–32)
Calcium: 7.7 mg/dL — ABNORMAL LOW (ref 8.9–10.3)
Chloride: 109 mmol/L (ref 101–111)
Creatinine, Ser: 1.49 mg/dL — ABNORMAL HIGH (ref 0.44–1.00)
GFR calc Af Amer: 41 mL/min — ABNORMAL LOW (ref >60)
GFR calc non Af Amer: 36 mL/min — ABNORMAL LOW (ref >60)
Glucose, Bld: 230 mg/dL — ABNORMAL HIGH (ref 65–99)
POTASSIUM: 4.7 mmol/L (ref 3.5–5.1)
Sodium: 139 mmol/L (ref 135–145)

## 2017-06-22 LAB — CBC
HEMATOCRIT: 40 % (ref 36.0–46.0)
HEMOGLOBIN: 12.5 g/dL (ref 12.0–15.0)
MCH: 32.6 pg (ref 26.0–34.0)
MCHC: 31.3 g/dL (ref 30.0–36.0)
MCV: 104.4 fL — AB (ref 78.0–100.0)
Platelets: 169 10*3/uL (ref 150–400)
RBC: 3.83 MIL/uL — AB (ref 3.87–5.11)
RDW: 14.6 % (ref 11.5–15.5)
WBC: 18.5 10*3/uL — ABNORMAL HIGH (ref 4.0–10.5)

## 2017-06-22 LAB — APTT
aPTT: 22 seconds — ABNORMAL LOW (ref 24–36)
aPTT: 115 seconds — ABNORMAL HIGH (ref 24–36)

## 2017-06-22 LAB — MRSA PCR SCREENING: MRSA by PCR: NEGATIVE

## 2017-06-22 LAB — LACTIC ACID, PLASMA
LACTIC ACID, VENOUS: 1.5 mmol/L (ref 0.5–1.9)
Lactic Acid, Venous: 1.6 mmol/L (ref 0.5–1.9)

## 2017-06-22 LAB — CORTISOL-AM, BLOOD: Cortisol - AM: 38.4 ug/dL — ABNORMAL HIGH (ref 6.7–22.6)

## 2017-06-22 LAB — PROCALCITONIN: PROCALCITONIN: 0.66 ng/mL

## 2017-06-22 LAB — D-DIMER, QUANTITATIVE (NOT AT ARMC): D DIMER QUANT: 2.94 ug{FEU}/mL — AB (ref 0.00–0.50)

## 2017-06-22 LAB — HEPARIN LEVEL (UNFRACTIONATED): Heparin Unfractionated: 2.1 IU/mL — ABNORMAL HIGH (ref 0.30–0.70)

## 2017-06-22 MED ORDER — ORAL CARE MOUTH RINSE
15.0000 mL | Freq: Two times a day (BID) | OROMUCOSAL | Status: DC
  Administered 2017-06-23: 15 mL via OROMUCOSAL

## 2017-06-22 MED ORDER — SODIUM CHLORIDE 0.9 % IV SOLN
10.0000 mL/h | INTRAVENOUS | Status: DC
  Administered 2017-06-22: 10 mL/h via INTRAVENOUS

## 2017-06-22 MED ORDER — CHLORHEXIDINE GLUCONATE 0.12 % MT SOLN
15.0000 mL | Freq: Two times a day (BID) | OROMUCOSAL | Status: DC
  Administered 2017-06-22 – 2017-06-24 (×4): 15 mL via OROMUCOSAL
  Filled 2017-06-22 (×4): qty 15

## 2017-06-22 MED ORDER — IPRATROPIUM-ALBUTEROL 0.5-2.5 (3) MG/3ML IN SOLN
3.0000 mL | Freq: Four times a day (QID) | RESPIRATORY_TRACT | Status: DC
  Administered 2017-06-22 (×3): 3 mL via RESPIRATORY_TRACT
  Filled 2017-06-22 (×3): qty 3

## 2017-06-22 MED ORDER — FUROSEMIDE 10 MG/ML IJ SOLN
20.0000 mg | Freq: Once | INTRAMUSCULAR | Status: DC
  Filled 2017-06-22: qty 2

## 2017-06-22 MED ORDER — HYDROCORTISONE NA SUCCINATE PF 100 MG IJ SOLR
40.0000 mg | Freq: Two times a day (BID) | INTRAMUSCULAR | Status: DC
  Administered 2017-06-22 – 2017-06-23 (×3): 40 mg via INTRAVENOUS
  Filled 2017-06-22 (×3): qty 2

## 2017-06-22 MED ORDER — FUROSEMIDE 10 MG/ML IJ SOLN
40.0000 mg | Freq: Once | INTRAMUSCULAR | Status: AC
  Administered 2017-06-22: 40 mg via INTRAVENOUS

## 2017-06-22 MED ORDER — HEPARIN (PORCINE) IN NACL 100-0.45 UNIT/ML-% IJ SOLN
800.0000 [IU]/h | INTRAMUSCULAR | Status: DC
  Administered 2017-06-22: 800 [IU]/h via INTRAVENOUS
  Filled 2017-06-22: qty 250

## 2017-06-22 MED ORDER — FUROSEMIDE 10 MG/ML IJ SOLN
INTRAMUSCULAR | Status: AC
  Filled 2017-06-22: qty 2

## 2017-06-22 MED ORDER — INSULIN ASPART 100 UNIT/ML ~~LOC~~ SOLN
0.0000 [IU] | SUBCUTANEOUS | Status: DC
  Administered 2017-06-22 (×2): 2 [IU] via SUBCUTANEOUS

## 2017-06-22 MED ORDER — TORSEMIDE 20 MG PO TABS
20.0000 mg | ORAL_TABLET | Freq: Two times a day (BID) | ORAL | Status: DC
  Administered 2017-06-22 – 2017-06-24 (×5): 20 mg via ORAL
  Filled 2017-06-22 (×5): qty 1

## 2017-06-22 MED ORDER — ARFORMOTEROL TARTRATE 15 MCG/2ML IN NEBU
15.0000 ug | INHALATION_SOLUTION | Freq: Two times a day (BID) | RESPIRATORY_TRACT | Status: DC
  Administered 2017-06-22 – 2017-06-24 (×5): 15 ug via RESPIRATORY_TRACT
  Filled 2017-06-22 (×5): qty 2

## 2017-06-22 MED ORDER — HEPARIN (PORCINE) IN NACL 100-0.45 UNIT/ML-% IJ SOLN: 650.0000 [IU]/h | INTRAMUSCULAR | Status: DC

## 2017-06-22 NOTE — Progress Notes (Signed)
Pt has been transferred to 2C13 stepdown unit for dyspnea/sob, low 02 and chest pressure. On call provider was in and out of the patient's room with her assessment. Provider deems it necessary to transfer for a higher level of care.

## 2017-06-22 NOTE — Consult Note (Signed)
PULMONARY / CRITICAL CARE MEDICINE   Name: Angela Shepherd MRN: [id number] DOB: June 12, 1950    ADMISSION DATE:  06/20/2017 CONSULTATION DATE:  06/22/2017  REFERRING MD:  Kennon Holter, NP  CHIEF COMPLAINT:  Respiratory Distress   HISTORY OF PRESENT ILLNESS:   67 year old female with PMH of Bradycardia, Systolic HF (EF 06-26 with mild LVH, and moderately reduced systolic function), Hypotension, A.Fib on Eliquis, COPD, CKD stage 3   Presents to ED on 6/13 dyspnea, nausea, and syncope. Was at drug store when she passed out, complains of mild Chest Pain. Upon arrival to ED was noted to have wheezing, given bronchodilator. BP 71/57 which improved after 500 cc bolus normal saline. BNP 781. WBC 4.5. Troponin 0.03. Admitted to telemetry. Cardiology consulted. Believed syncope secondary to hypotension due to over diuresis.   On 6/15 patient decompensated. Noted to be hypoxic with chest pain. CXR with worsening vascular congestion. ABG 7.09/60.9/60.3. Given 40 meq lasix and placed on BiPAP. ABG 7.08/73.5/80.4. Patient transferred to ICU for further management.   PAST MEDICAL HISTORY :  She  has a past medical history of Bradycardia, Chronic systolic CHF (congestive heart failure) ([place]), Cluster headache syndrome, Hypertr obst cardiomyop, Hypokalemia, Hypotension, Paroxysmal atrial fibrillation ([place]), and Pneumonia (2008).  PAST SURGICAL HISTORY: She  has a past surgical history that includes Tonsillectomy (~ 1959); Tubal ligation (1984?); Pacemaker insertion (1994; 2002; 05/08/2012); and bi-ventricular pacemaker upgrade (N/A, 05/08/2012).  Allergies  Allergen Reactions  . Prednisone Other (See Comments)    SOB, CHF     No current facility-administered medications on file prior to encounter.    Current Outpatient Medications on File Prior to Encounter  Medication Sig  . albuterol (PROVENTIL HFA;VENTOLIN HFA) 108 (90 Base) MCG/ACT inhaler Inhale 1-2 puffs into the lungs every 6 (six) hours as needed for  wheezing or shortness of breath.  . ALPRAZolam (XANAX) 0.25 MG tablet Take 1 tablet (0.25 mg total) by mouth 2 (two) times daily as needed for anxiety.  . AMBULATORY NON FORMULARY MEDICATION Medication Name: 100% O2 15 leters a min for 15-20 mins  Via Nasal cannual (Patient taking differently: Medication Name: 100% O2 15 leters a min for 15-20 mins as needed  Via Nasal cannual)  . amitriptyline (ELAVIL) 10 MG tablet Take 10 mg by mouth as needed (headaches).   Marland Kitchen apixaban (ELIQUIS) 5 MG TABS tablet Take 1 tablet (5 mg total) by mouth 2 (two) times daily.  Marland Kitchen aspirin 81 MG chewable tablet Chew 81 mg by mouth daily.  . carvedilol (COREG) 12.5 MG tablet TAKE 1 TABLET BY MOUTH 2 TIMES DAILY WITH A MEAL.  . cyclobenzaprine (FLEXERIL) 5 MG tablet TAKE 1 TABLET (5 MG TOTAL) BY MOUTH 3 (THREE) TIMES DAILY AS NEEDED FOR MUSCLE SPASMS.  Marland Kitchen diclofenac sodium (VOLTAREN) 1 % GEL Apply 2 g topically 4 (four) times daily. (Patient taking differently: Apply 2 g topically 4 (four) times daily as needed (pain). )  . FLUoxetine (PROZAC) 20 MG capsule Take 1 capsule (20 mg total) by mouth daily.  . fluticasone (FLONASE) 50 MCG/ACT nasal spray PLACE 2 SPRAYS INTO BOTH NOSTRILS DAILY. (Patient taking differently: Place 2 sprays into both nostrils daily as needed for allergies. )  . ipratropium-albuterol (DUONEB) 0.5-2.5 (3) MG/3ML SOLN USE 1 VIAL (3ML) BY NEBULIZER EVERY 4 TO 6 HOURS AS NEEDED (Patient taking differently: Take 3 mLs by nebulization every 4 (four) hours as needed (shortness of breath). )  . loratadine (CLARITIN) 10 MG tablet Take 1 tablet (10 mg  total) by mouth daily.  Marland Kitchen losartan (COZAAR) 25 MG tablet Take 1 tablet (25 mg total) by mouth 2 (two) times daily.  . nicotine (NICODERM CQ - DOSED IN MG/24 HOURS) 14 mg/24hr patch Place 1 patch (14 mg total) onto the skin daily. (Patient taking differently: Place 14 mg onto the skin daily as needed (nicotine withdrawal). )  . potassium chloride SA (K-DUR,KLOR-CON) 20  MEQ tablet Take 1 tablet (20 mEq total) by mouth daily.  Marland Kitchen Spacer/Aero-Holding Chambers (AEROCHAMBER MV) inhaler Use as instructed with Symbicort  . spironolactone (ALDACTONE) 25 MG tablet Take 1 tablet (25 mg total) by mouth daily.  . SUMAtriptan (IMITREX) 50 MG tablet TAKE 1 TABLET AT ONSET OF HEADACHE. MAY REPEAT IN 2 HOURS IF HEADACHE PERSISTS OR RECURS. (Patient taking differently: TAKE 1 TABLET (50MG ) AS NEEDED AT ONSET OF HEADACHE. MAY REPEAT IN 2 HOURS IF HEADACHE PERSISTS OR RECURS.)  . SYMBICORT 160-4.5 MCG/ACT inhaler INHALE 2 PUFFS BY MOUTH INTO THE LUNGS 2 TIMES A DAY  . topiramate (TOPAMAX) 50 MG tablet Take 50 mg by mouth daily.  Marland Kitchen torsemide (DEMADEX) 20 MG tablet Take 1 tablet (20 mg total) by mouth 2 (two) times daily. (Patient taking differently: Take 40 mg by mouth 2 (two) times daily. )    FAMILY HISTORY:  Her indicated that her mother is alive. She indicated that her father is deceased. She indicated that only one of her three sisters is alive. She indicated that the status of her brother is unknown. She indicated that her maternal grandmother is deceased. She indicated that her maternal grandfather is deceased. She indicated that her paternal grandmother is deceased. She indicated that her paternal grandfather is deceased. She indicated that the status of her neg hx is unknown. She indicated that the status of her unknown relative is unknown.   SOCIAL HISTORY: She  reports that she has quit smoking. She smoked 3.00 packs per day. She has never used smokeless tobacco. She reports that she drinks about 1.2 oz of alcohol per week. She reports that she does not use drugs.  REVIEW OF SYSTEMS:   Unable to review as patient is encephalopathic   SUBJECTIVE:   VITAL SIGNS: BP (!) 153/92 (BP Location: Left Arm)   Pulse (!) 117   Temp 98 F (36.7 C) (Oral)   Resp (!) 32   Ht 5\' 1"  (1.549 m)   Wt 58.2 kg (128 lb 6.4 oz)   LMP  (LMP Unknown)   SpO2 100%   BMI 24.26 kg/m    HEMODYNAMICS:    VENTILATOR SETTINGS: Vent Mode: BIPAP FiO2 (%):  [100 %] 100 % Set Rate:  [20 bmp] 20 bmp PEEP:  [8 cmH20] 8 cmH20  INTAKE / OUTPUT: I/O last 3 completed shifts: In: 1114.6 [P.O.:220; I.V.:894.6] Out: 600 [Urine:600]  PHYSICAL EXAMINATION: General:  Adult female, on BiPAP Neuro:  Awakens to verbal stimulation, Follows commands, lethargic  HEENT:  Dry MM  Cardiovascular:  Irregular  Lungs:  Exp Wheeze, crackles to bases  Abdomen:  Non-tender, active bowel sounds  Musculoskeletal:  +1 pedal edema  Skin:  Warm ,dry   LABS:  BMET Recent Labs  Lab 06/20/17 1644 06/21/17 0736  NA 138 139  K 4.7 4.1  CL 105 110  CO2 18* 19*  BUN 31* 29*  CREATININE 1.53* 1.21*  GLUCOSE 225* 190*    Electrolytes Recent Labs  Lab 06/20/17 1644 06/21/17 0736  CALCIUM 8.8* 8.0*    CBC Recent Labs  Lab 06/20/17  1644 06/21/17 0736  WBC 4.5 7.5  HGB 13.4 11.9*  HCT 40.6 36.3  PLT 190 158    Coag's No results for input(s): APTT, INR in the last 168 hours.  Sepsis Markers No results for input(s): LATICACIDVEN, PROCALCITON, O2SATVEN in the last 168 hours.  ABG Recent Labs  Lab 06/22/17 0055  PHART 7.091*  PCO2ART 60.9*  PO2ART 60.3*    Liver Enzymes Recent Labs  Lab 06/20/17 1644  AST 117*  ALT 71*  ALKPHOS 99  BILITOT 0.9  ALBUMIN 3.9    Cardiac Enzymes Recent Labs  Lab 06/21/17 0143 06/21/17 0736 06/21/17 2058  TROPONINI 0.06* 0.04* 0.05*    Glucose No results for input(s): GLUCAP in the last 168 hours.  Imaging Dg Chest Port 1 View  Result Date: 06/22/2017 CLINICAL DATA:  Respiratory distress EXAM: PORTABLE CHEST 1 VIEW COMPARISON:  06/20/2016, CT chest 03/02/2017 FINDINGS: Left-sided pacing device as before. Cardiomegaly with mild vascular congestion. Diffuse interstitial opacity, likely chronic scarring and fibrosis. Blunting of the right CP angle consistent with pleural scarring. Similar appearance of right peripheral  pulmonary scarring. Probable skin fold artifact at the right apex. IMPRESSION: 1. Cardiomegaly with mild central congestion 2. Similar appearance of pleural and parenchymal scarring in the right thorax 3. Suspected skin fold artifacts at the lung apices. Electronically Signed   By: Donavan Foil M.D.   On: 06/22/2017 01:11     STUDIES:  CT Head 6/14 > Negative.  No CT evidence for acute intracranial abnormality CXR 6/15 > 1. Cardiomegaly with mild central congestion 2. Similar appearance of pleural and parenchymal scarring in the right thorax 3. Suspected skin fold artifacts at the lung apices  CULTURES: None.   ANTIBIOTICS: None.   SIGNIFICANT EVENTS: 6/13 > Presents to ED  6/15 > Transferred to ICU   LINES/TUBES: PIV   DISCUSSION: 67 year old female with COPD, Systolic/Diastolic HF. Presents with syncope. Believed to be over diuresis. On 6/15 patient with respiratory distress and lethargy requiring BiPAP and transfer to ICU.   ASSESSMENT / PLAN:  Acute Hypoxic/Hypercarbic Respiratory Failure presumed secondary to decompensated heart failure with pleural effusions +/-AECOPD Plan  -Continue BiPAP > Repeat Gas with improved pH and CO2  -Trend ABG /CXR -Scheduled Duoneb  -Pulmonary Hygiene  -Foley Placed, Lasix 40 meq now   Acute on Chronic Systolic/Diastolic HF  Elevated Troponin  -Documented Chronic Elevation, Cardiology Believes not Ischemic   Hypotension (Improved)  A.Fib (on Eliquis)  H/O HTN Plan  -Cardiology Following  -Cardiac Monitoring  -Maintain MAP >65 -On Eliquis, Place on Heparin gtt  Acute on CKD stage 3  -Baseline Crt 1.1-1.4  Plan  -Trend BMP  -Replace electrolytes as indicated  -LA pending   Transminitis in setting of decompensated heart failure vs hypoperfusion  Plan  -Trend LFT   Leukocytosis Afebrile  Plan  -Trend WBC and Fever Curve -Trend LA and PCT    Acute Encephalopathy in setting of hypercarbia with AECOPD/Decompensated Heart  Failure vs Polypharmacy (given Xanax, however takes at home)  Plan  -Monitor  -Hold Sedating Medications    FAMILY  - Updates: no family at bedside   - Inter-disciplinary family meet or Palliative Care meeting due by: 06/29/2017      Hayden Pedro, AGACNP-BC [facility]  Pgr: [phone]  PCCM Pgr: [phone]

## 2017-06-22 NOTE — Progress Notes (Signed)
Patient remains on NIV at this time, gas shows improvement. NIV settings as documented RCP will continue to monitor.

## 2017-06-22 NOTE — Progress Notes (Signed)
ANTICOAGULATION CONSULT NOTE - Initial Consult  Pharmacy Consult for heparin Indication: atrial fibrillation  Allergies  Allergen Reactions  . Prednisone Other (See Comments)    SOB, CHF     Patient Measurements: Height: 5\' 1"  (154.9 cm) Weight: 124 lb 5.4 oz (56.4 kg) IBW/kg (Calculated) : 47.8 Heparin Dosing Weight: 58 kg  Vital Signs: Temp: 97.9 F (36.6 C) (06/15 0300) Temp Source: Oral (06/15 0300) BP: 97/64 (06/15 0415) Pulse Rate: 81 (06/15 0421)  Labs: Recent Labs    06/20/17 1644  06/21/17 0143 06/21/17 0736 06/21/17 2058 06/22/17 0230  HGB 13.4  --   --  11.9*  --  12.5  HCT 40.6  --   --  36.3  --  40.0  PLT 190  --   --  158  --  169  CREATININE 1.53*  --   --  1.21*  --  1.49*  TROPONINI 0.03*   < > 0.06* 0.04* 0.05*  --    < > = values in this interval not displayed.    Estimated Creatinine Clearance: 28.4 mL/min (A) (by C-G formula based on SCr of 1.49 mg/dL (H)).   Medical History: Past Medical History:  Diagnosis Date  . Bradycardia    a. initial PPM placed 1994 [facility] with gen change 2002. b. 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 Jude BiV PPM 05/08/12.  . Chronic systolic CHF (congestive heart failure) ([place])    a. EF 30-35% dx in 2011 -> most recent 40-45% by echo 04/2012. b. s/p upgrade to BiV-PPM 05/08/12. c. Med titration limited by hypotension.  . Cluster headache syndrome   . Hypertr obst cardiomyop   . Hypokalemia   . Hypotension   . Paroxysmal atrial fibrillation ([facility])    a. identified on PPM interrogation b. longest episode 1 hour; if burden increases, will need [place]   . Pneumonia 2008    Medications:  Medications Prior to Admission  Medication Sig Dispense Refill Last Dose  . albuterol (PROVENTIL HFA;VENTOLIN HFA) 108 (90 Base) MCG/ACT inhaler Inhale 1-2 puffs into the lungs every 6 (six) hours as needed for wheezing or shortness of breath. 1 Inhaler 1 06/20/2017 at Unknown time  . ALPRAZolam (XANAX) 0.25 MG  tablet Take 1 tablet (0.25 mg total) by mouth 2 (two) times daily as needed for anxiety. 30 tablet 3 06/20/2017 at Unknown time  . AMBULATORY NON FORMULARY MEDICATION Medication Name: 100% O2 15 leters a min for 15-20 mins  Via Nasal cannual (Patient taking differently: Medication Name: 100% O2 15 leters a min for 15-20 mins as needed  Via Nasal cannual) 1 Bottle 2 unknown at prn  . amitriptyline (ELAVIL) 10 MG tablet Take 10 mg by mouth as needed (headaches).    unknown  . apixaban (ELIQUIS) 5 MG TABS tablet Take 1 tablet (5 mg total) by mouth 2 (two) times daily. 60 tablet 3 06/19/2017 at 1500  . aspirin 81 MG chewable tablet Chew 81 mg by mouth daily.   06/19/2017 at Unknown time  . carvedilol (COREG) 12.5 MG tablet TAKE 1 TABLET BY MOUTH 2 TIMES DAILY WITH A MEAL. 60 tablet 3 06/19/2017 at 1500  . cyclobenzaprine (FLEXERIL) 5 MG tablet TAKE 1 TABLET (5 MG TOTAL) BY MOUTH 3 (THREE) TIMES DAILY AS NEEDED FOR MUSCLE SPASMS. 30 tablet 2 unknown  . diclofenac sodium (VOLTAREN) 1 % GEL Apply 2 g topically 4 (four) times daily. (Patient taking differently: Apply 2 g topically 4 (four)  times daily as needed (pain). ) 3 Tube 0 unknown  . FLUoxetine (PROZAC) 20 MG capsule Take 1 capsule (20 mg total) by mouth daily. 30 capsule 3 06/19/2017 at Unknown time  . fluticasone (FLONASE) 50 MCG/ACT nasal spray PLACE 2 SPRAYS INTO BOTH NOSTRILS DAILY. (Patient taking differently: Place 2 sprays into both nostrils daily as needed for allergies. ) 16 g 5 unknown  . ipratropium-albuterol (DUONEB) 0.5-2.5 (3) MG/3ML SOLN USE 1 VIAL (3ML) BY NEBULIZER EVERY 4 TO 6 HOURS AS NEEDED (Patient taking differently: Take 3 mLs by nebulization every 4 (four) hours as needed (shortness of breath). ) 90 mL 0 Taking  . loratadine (CLARITIN) 10 MG tablet Take 1 tablet (10 mg total) by mouth daily. 30 tablet 5 06/19/2017 at Unknown time  . losartan (COZAAR) 25 MG tablet Take 1 tablet (25 mg total) by mouth 2 (two) times daily. 60 tablet 6  06/19/2017 at Unknown time  . nicotine (NICODERM CQ - DOSED IN MG/24 HOURS) 14 mg/24hr patch Place 1 patch (14 mg total) onto the skin daily. (Patient taking differently: Place 14 mg onto the skin daily as needed (nicotine withdrawal). ) 28 patch 0 unknown  . potassium chloride SA (K-DUR,KLOR-CON) 20 MEQ tablet Take 1 tablet (20 mEq total) by mouth daily. 90 tablet 3 06/19/2017 at Unknown time  . Spacer/Aero-Holding Chambers (AEROCHAMBER MV) inhaler Use as instructed with Symbicort 1 each 0 06/19/2017 at Unknown time  . spironolactone (ALDACTONE) 25 MG tablet Take 1 tablet (25 mg total) by mouth daily. 30 tablet 11 06/19/2017 at Unknown time  . SUMAtriptan (IMITREX) 50 MG tablet TAKE 1 TABLET AT ONSET OF HEADACHE. MAY REPEAT IN 2 HOURS IF HEADACHE PERSISTS OR RECURS. (Patient taking differently: TAKE 1 TABLET (50MG ) AS NEEDED AT ONSET OF HEADACHE. MAY REPEAT IN 2 HOURS IF HEADACHE PERSISTS OR RECURS.) 10 tablet 6 unknown  . SYMBICORT 160-4.5 MCG/ACT inhaler INHALE 2 PUFFS BY MOUTH INTO THE LUNGS 2 TIMES A DAY 10.2 g 3 06/19/2017 at Unknown time  . topiramate (TOPAMAX) 50 MG tablet Take 50 mg by mouth daily.   06/19/2017 at Unknown time  . torsemide (DEMADEX) 20 MG tablet Take 1 tablet (20 mg total) by mouth 2 (two) times daily. (Patient taking differently: Take 40 mg by mouth 2 (two) times daily. ) 60 tablet 6 06/20/2017 at Unknown time    Assessment: 67 yo lady to start heparin for afib while eliquis on hold.  Last dose eliquis 6/14 at 21:44 Goal of Therapy:  Heparin level 0.3-.07 units/ml aPTT 66-102 seconds Monitor platelets by anticoagulation protocol: Yes   Plan:  Check baseline aPTT and heparin level Start heparin drip at 800 units/hr at 10:00 Check aPTT and HL  6-8 hours after start  Angela Shepherd 06/22/2017,4:34 AM

## 2017-06-22 NOTE — Progress Notes (Signed)
Provider on call was notified of patient's C/O chest pressure. EKG was done at the moment per provider order. Pt was put on 2 liters oxygen for increased work of breathing. Pt reported after awhile that she was feeling better. Later patient started getting worse and stated, "I feel like I'm having panic attack". Providers was notified at that time and Pt was medicated. CXR was ordered, including blood gas and other blood work. At this time, pt is using her accessory muscles and having increased SOB. Rapid response was called for further assessment.

## 2017-06-22 NOTE — Significant Event (Signed)
Rapid Response Event Note  Overview: Respiratory   Initial Focused Assessment: Called by primary RN about patient having increased shortness of breath and n/v, per RN, she thinks that patient's is volume overloaded  Upon arrival, patient was sitting at the edge of the bed, tripoding, dry heaving over a bedside trashcan, her skin was gray and her lips were grayish-blue. Her oxygen saturations were 70-72% on 2L [place], coarse crackles throughout lung fields. Patient is more sleepier than before per RN and RT. Increased work of breathing, + use of accessory muscles, RR 32-36. [facility] NP saw the patient earlier tonight for chest pressure and an EKG was done. [facility] NP paged and came to bedside. Patient was very anxious and had generalized tremors, she denied alcohol use. HR in the 100-120, which is new for this admission and SBP 140s-150s.   Interventions: -- Transitioned to NRB 15L -- STAT CXR -- STAT ABG > 7.091/60.9/60.03/26/78, ABG report to NP -- BIPAP > 100% 20/8 -- LASIX 40mg  IV  -- FOLEY -- PIV x 1  -- STAT LABS (CMP, CBC, D-Dimer)  Plan of Care: -- NP consulted PCCM, PCCM NP came and saw patient, plan leave patient on BIPAP, transfer to SDU, and repeat a ABG in an hour. -- After about 55 minutes on the BIPAP, patient became very anxious, stated she need the mask to come off and that she felt like she was going to vomit. Patient taken off BIPAP, given Zofran, repeat ABG done, and patient was transferred to [facility]. Once in SDU, patient's color was back to being gray, still sleepy. Repeat ABG > 7.082/73.5/80.04/27/88. TRH NP called and critical ABG reported, PCCM NP contacted and updated, plan to transfer patient to ICU. Patient transferred to 2H22.  Event Summary:   at    Call Time 0036 Arrival Time 0038 End Time [place], [place]

## 2017-06-22 NOTE — Progress Notes (Addendum)
ANTICOAGULATION CONSULT NOTE -follow up Pharmacy Consult for heparin Indication: atrial fibrillation  Allergies  Allergen Reactions  . Prednisone Other (See Comments)    SOB, CHF     Patient Measurements: Height: 5\' 1"  (154.9 cm) Weight: 124 lb 5.4 oz (56.4 kg) IBW/kg (Calculated) : 47.8 Heparin Dosing Weight: 56.4 kg  Vital Signs: Temp: 98 F (36.7 C) (06/15 1500) Temp Source: Oral (06/15 1500) BP: 76/50 (06/15 1900) Pulse Rate: 65 (06/15 1900)  Labs: Recent Labs    06/20/17 1644  06/21/17 0143 06/21/17 0736 06/21/17 2058 06/22/17 0230 06/22/17 0807 06/22/17 1840  HGB 13.4  --   --  11.9*  --  12.5  --   --   HCT 40.6  --   --  36.3  --  40.0  --   --   PLT 190  --   --  158  --  169  --   --   APTT  --   --   --   --   --   --  22* 115*  HEPARINUNFRC  --   --   --   --   --   --  2.10*  --   CREATININE 1.53*  --   --  1.21*  --  1.49*  --   --   TROPONINI 0.03*   < > 0.06* 0.04* 0.05*  --   --   --    < > = values in this interval not displayed.    Estimated Creatinine Clearance: 28.4 mL/min (A) (by C-G formula based on SCr of 1.49 mg/dL (H)).   Medical History: Past Medical History:  Diagnosis Date  . Bradycardia    a. initial PPM placed 1994 at [facility] with gen change 2002. b. 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 Jude BiV PPM 05/08/12.  . Chronic systolic CHF (congestive heart failure) ([place])    a. EF 30-35% dx in 2011 -> most recent 40-45% by echo 04/2012. b. s/p upgrade to BiV-PPM 05/08/12. c. Med titration limited by hypotension.  . Cluster headache syndrome   . Hypertr obst cardiomyop   . Hypokalemia   . Hypotension   . Paroxysmal atrial fibrillation (HCC)    a. identified on PPM interrogation b. longest episode 1 hour; if burden increases, will need [place]   . Pneumonia 2008    Medications:  Medications Prior to Admission  Medication Sig Dispense Refill Last Dose  . albuterol (PROVENTIL HFA;VENTOLIN HFA) 108 (90 Base) MCG/ACT  inhaler Inhale 1-2 puffs into the lungs every 6 (six) hours as needed for wheezing or shortness of breath. 1 Inhaler 1 06/20/2017 at Unknown time  . ALPRAZolam (XANAX) 0.25 MG tablet Take 1 tablet (0.25 mg total) by mouth 2 (two) times daily as needed for anxiety. 30 tablet 3 06/20/2017 at Unknown time  . AMBULATORY NON FORMULARY MEDICATION Medication Name: 100% O2 15 leters a min for 15-20 mins  Via Nasal cannual (Patient taking differently: Medication Name: 100% O2 15 leters a min for 15-20 mins as needed  Via Nasal cannual) 1 Bottle 2 unknown at prn  . amitriptyline (ELAVIL) 10 MG tablet Take 10 mg by mouth as needed (headaches).    unknown  . apixaban (ELIQUIS) 5 MG TABS tablet Take 1 tablet (5 mg total) by mouth 2 (two) times daily. 60 tablet 3 06/19/2017 at 1500  . aspirin 81 MG chewable tablet Chew 81 mg by mouth daily.   06/19/2017 at Unknown  time  . carvedilol (COREG) 12.5 MG tablet TAKE 1 TABLET BY MOUTH 2 TIMES DAILY WITH A MEAL. 60 tablet 3 06/19/2017 at 1500  . cyclobenzaprine (FLEXERIL) 5 MG tablet TAKE 1 TABLET (5 MG TOTAL) BY MOUTH 3 (THREE) TIMES DAILY AS NEEDED FOR MUSCLE SPASMS. 30 tablet 2 unknown  . diclofenac sodium (VOLTAREN) 1 % GEL Apply 2 g topically 4 (four) times daily. (Patient taking differently: Apply 2 g topically 4 (four) times daily as needed (pain). ) 3 Tube 0 unknown  . FLUoxetine (PROZAC) 20 MG capsule Take 1 capsule (20 mg total) by mouth daily. 30 capsule 3 06/19/2017 at Unknown time  . fluticasone (FLONASE) 50 MCG/ACT nasal spray PLACE 2 SPRAYS INTO BOTH NOSTRILS DAILY. (Patient taking differently: Place 2 sprays into both nostrils daily as needed for allergies. ) 16 g 5 unknown  . ipratropium-albuterol (DUONEB) 0.5-2.5 (3) MG/3ML SOLN USE 1 VIAL (3ML) BY NEBULIZER EVERY 4 TO 6 HOURS AS NEEDED (Patient taking differently: Take 3 mLs by nebulization every 4 (four) hours as needed (shortness of breath). ) 90 mL 0 Taking  . loratadine (CLARITIN) 10 MG tablet Take 1 tablet  (10 mg total) by mouth daily. 30 tablet 5 06/19/2017 at Unknown time  . losartan (COZAAR) 25 MG tablet Take 1 tablet (25 mg total) by mouth 2 (two) times daily. 60 tablet 6 06/19/2017 at Unknown time  . nicotine (NICODERM CQ - DOSED IN MG/24 HOURS) 14 mg/24hr patch Place 1 patch (14 mg total) onto the skin daily. (Patient taking differently: Place 14 mg onto the skin daily as needed (nicotine withdrawal). ) 28 patch 0 unknown  . potassium chloride SA (K-DUR,KLOR-CON) 20 MEQ tablet Take 1 tablet (20 mEq total) by mouth daily. 90 tablet 3 06/19/2017 at Unknown time  . Spacer/Aero-Holding Chambers (AEROCHAMBER MV) inhaler Use as instructed with Symbicort 1 each 0 06/19/2017 at Unknown time  . spironolactone (ALDACTONE) 25 MG tablet Take 1 tablet (25 mg total) by mouth daily. 30 tablet 11 06/19/2017 at Unknown time  . SUMAtriptan (IMITREX) 50 MG tablet TAKE 1 TABLET AT ONSET OF HEADACHE. MAY REPEAT IN 2 HOURS IF HEADACHE PERSISTS OR RECURS. (Patient taking differently: TAKE 1 TABLET (50MG ) AS NEEDED AT ONSET OF HEADACHE. MAY REPEAT IN 2 HOURS IF HEADACHE PERSISTS OR RECURS.) 10 tablet 6 unknown  . SYMBICORT 160-4.5 MCG/ACT inhaler INHALE 2 PUFFS BY MOUTH INTO THE LUNGS 2 TIMES A DAY 10.2 g 3 06/19/2017 at Unknown time  . topiramate (TOPAMAX) 50 MG tablet Take 50 mg by mouth daily.   06/19/2017 at Unknown time  . torsemide (DEMADEX) 20 MG tablet Take 1 tablet (20 mg total) by mouth 2 (two) times daily. (Patient taking differently: Take 40 mg by mouth 2 (two) times daily. ) 60 tablet 6 06/20/2017 at Unknown time    Assessment: 67 yo lady to start heparin for afib while eliquis on hold.  Last dose eliquis 6/14 at 21:44. Baseline aPTT  = 22 seconds and baseline heparin level = 2.10  PTT = 115 seconds on heparin 800 units/hr. , no bleeding except small amt at peripheral IV site under dressing but not leaking per RN. Will reduce heparin rate by ~ 2 units/kg/hr.     Goal of Therapy:  Heparin level 0.3-.07  units/ml aPTT 66-102 seconds Monitor platelets by anticoagulation protocol: Yes   Plan:  Hold heparin drip x 30 minutes then resume at reduced rate of  650 units/hr  Check aPTT in 6 hour Daily HL, CBC.  Nicole Cella, RPh Clinical Pharmacist 06/22/2017,7:54 PM

## 2017-06-22 NOTE — Progress Notes (Addendum)
Progress Note  Patient Name: Angela Shepherd Date of Encounter: 06/22/2017  Primary Cardiologist: No primary care provider on file.   Subjective   Wants bipap off. Breathing better this AM.  Denies pain.   Inpatient Medications    Scheduled Meds: . arformoterol  15 mcg Nebulization BID  . aspirin  81 mg Oral Daily  . carvedilol  6.25 mg Oral BID WC  . chlorhexidine  15 mL Mouth Rinse BID  . FLUoxetine  20 mg Oral Daily  . insulin aspart  0-15 Units Subcutaneous Q4H  . ipratropium-albuterol  3 mL Nebulization Q6H  . loratadine  10 mg Oral Daily  . mouth rinse  15 mL Mouth Rinse q12n4p  . sodium chloride flush  3 mL Intravenous Q12H  . topiramate  50 mg Oral Daily   Continuous Infusions: . heparin     PRN Meds: albuterol, amitriptyline, dextromethorphan-guaiFENesin, diclofenac sodium, fluticasone, nitroGLYCERIN, ondansetron (ZOFRAN) IV, SUMAtriptan   Vital Signs    Vitals:   06/22/17 0629 06/22/17 0630 06/22/17 0700 06/22/17 0856  BP: 94/67 93/67 (!) 89/64   Pulse: 71 67 70 64  Resp: (!) 21 (!) 22 (!) 21 (!) 22  Temp:   98.1 F (36.7 C)   TempSrc:   Oral   SpO2: 99% 98% 99% 99%  Weight:      Height:        Intake/Output Summary (Last 24 hours) at 06/22/2017 0911 Last data filed at 06/22/2017 0743 Gross per 24 hour  Intake 240 ml  Output 225 ml  Net 15 ml   Filed Weights   06/21/17 0121 06/21/17 0500 06/22/17 0300  Weight: 127 lb 14.4 oz (58 kg) 128 lb 6.4 oz (58.2 kg) 124 lb 5.4 oz (56.4 kg)    Telemetry    ASVP.  Short runs of NSVT - Personally Reviewed  ECG    AS BiV paced.  Rate 70 bpm- Personally Reviewed  Physical Exam   VS:  BP (!) 89/64   Pulse 64   Temp 98.1 F (36.7 C) (Oral)   Resp (!) 22   Ht 5\' 1"  (1.549 m)   Wt 124 lb 5.4 oz (56.4 kg)   LMP  (LMP Unknown)   SpO2 99%   BMI 23.49 kg/m  , BMI Body mass index is 23.49 kg/m. GENERAL:  Ill-appearing HEENT: Pupils equal round and reactive, fundi not visualized, oral mucosa  unremarkable NECK:  Unable to assess JVD, waveform within normal limits, carotid upstroke brisk and symmetric, no bruits LUNGS:  Clear to auscultation bilaterally on anterior exam HEART:  RRR.  PMI not displaced or sustained,S1 and S2 within normal limits, no S3, no S4, no clicks, no rubs, no murmurs ABD:  Flat, positive bowel sounds normal in frequency in pitch, no bruits, no rebound, no guarding, no midline pulsatile mass, no hepatomegaly, no splenomegaly EXT:  2 plus pulses throughout, no edema, no cyanosis no clubbing SKIN:  No rashes no nodules NEURO:  Cranial nerves II through XII grossly intact, motor grossly intact throughout [facility]:  Cognitively intact, oriented to person place and time   Labs    Chemistry Recent Labs  Lab 06/20/17 1644 06/21/17 0736 06/22/17 0230  NA 138 139 139  K 4.7 4.1 4.7  CL 105 110 109  CO2 18* 19* 21*  GLUCOSE 225* 190* 230*  BUN 31* 29* 30*  CREATININE 1.53* 1.21* 1.49*  CALCIUM 8.8* 8.0* 7.7*  PROT 6.4*  --   --   ALBUMIN 3.9  --   --  AST 117*  --   --   ALT 71*  --   --   ALKPHOS 99  --   --   BILITOT 0.9  --   --   GFRNONAA 35* 46* 36*  GFRAA 40* 53* 41*  ANIONGAP 15 10 9      Hematology Recent Labs  Lab 06/20/17 1644 06/21/17 0736 06/22/17 0230  WBC 4.5 7.5 18.5*  RBC 4.05 3.63* 3.83*  HGB 13.4 11.9* 12.5  HCT 40.6 36.3 40.0  MCV 100.2* 100.0 104.4*  MCH 33.1 32.8 32.6  MCHC 33.0 32.8 31.3  RDW 13.9 14.3 14.6  PLT 190 158 169    Cardiac Enzymes Recent Labs  Lab 06/20/17 2106 06/21/17 0143 06/21/17 0736 06/21/17 2058  TROPONINI 0.06* 0.06* 0.04* 0.05*   No results for input(s): TROPIPOC in the last 168 hours.   BNP Recent Labs  Lab 06/20/17 1644  BNP 781.6*     DDimer  Recent Labs  Lab 06/22/17 0230  DDIMER 2.94*     Radiology    Dg Chest 2 View  Result Date: 06/20/2017 CLINICAL DATA:  Shortness of breath EXAM: CHEST - 2 VIEW COMPARISON:  03/02/2017 FINDINGS: Cardiac shadow is enlarged but  stable. Pacing device is again seen and stable. Chronic changes are again noted in the right mid lung. No new focal infiltrate or effusion is seen. No bony abnormality is noted. IMPRESSION: Stable chronic changes.  No acute abnormality is seen. Electronically Signed   By: Inez Catalina M.D.   On: 06/20/2017 17:13   Ct Head Wo Contrast  Result Date: 06/21/2017 CLINICAL DATA:  Syncope EXAM: CT HEAD WITHOUT CONTRAST TECHNIQUE: Contiguous axial images were obtained from the base of the skull through the vertex without intravenous contrast. COMPARISON:  None. FINDINGS: Brain: No evidence of acute infarction, hemorrhage, hydrocephalus, extra-axial collection or mass lesion/mass effect. Vascular: No hyperdense vessel or unexpected calcification. Minimal calcification at the carotid siphons. Skull: Normal. Negative for fracture or focal lesion. Sinuses/Orbits: No acute finding. Other: None IMPRESSION: Negative.  No CT evidence for acute intracranial abnormality. Electronically Signed   By: Donavan Foil M.D.   On: 06/21/2017 00:58   Dg Chest Port 1 View  Result Date: 06/22/2017 CLINICAL DATA:  Respiratory distress EXAM: PORTABLE CHEST 1 VIEW COMPARISON:  06/20/2016, CT chest 03/02/2017 FINDINGS: Left-sided pacing device as before. Cardiomegaly with mild vascular congestion. Diffuse interstitial opacity, likely chronic scarring and fibrosis. Blunting of the right CP angle consistent with pleural scarring. Similar appearance of right peripheral pulmonary scarring. Probable skin fold artifact at the right apex. IMPRESSION: 1. Cardiomegaly with mild central congestion 2. Similar appearance of pleural and parenchymal scarring in the right thorax 3. Suspected skin fold artifacts at the lung apices. Electronically Signed   By: Donavan Foil M.D.   On: 06/22/2017 01:11    Cardiac Studies   Echo 05/21/17:  Study Conclusions  - Left ventricle: Wall thickness was increased in a pattern of mild   LVH. Systolic function  was moderately reduced. The estimated   ejection fraction was in the range of 35% to 40%. Severe   hypokinesis of the mid-apicalanteroseptal, lateral,   inferolateral, and apical myocardium. - Ventricular septum: Septal motion showed abnormal function and   dyssynergy. - Aortic valve: There was mild regurgitation. - Mitral valve: There was mild to moderate regurgitation. - Left atrium: The atrium was moderately dilated. - Pulmonary arteries: Systolic pressure was moderately increased.   PA peak pressure: 41 mm Hg (S).  Patient  Profile     67 y.o. female with chronic systolic and diastolic heart failure, paroxysmal atrial fibrillation, bradycardia, HOCM, CKD 3, here with syncope, chest pain and shortness of breath.  Assessment & Plan    # Syncope: Thought to be due to overdiuresis in the setting of confusion about her torsemide dose.  Per patient device interrogation showed atrial fibrillation at the time of her syncopal episode.  Paperwork no available at this time.  No ventricular arrhythmias.   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  Wall thickness 1.1cm now and no evidence of obstruction on echo, though she did have septal wall thickness measured at 1.7cm in the past.  No ventricular arrhythmias.    # Acute on chronic systolic and diastolic heart failure: Patient became volume overloaded and required bipap overnight after holding diuretics for overdiuresis. Resume torsemide today.  Continue carvedilol.  She has a [employer] BiV PPM.  Home losartan and spironolactone on hold 2/2 AKI and hypotension.  Home carvedilol has been reduced.  Echo unchanged from prior.  # Bradycardia:  S/p BiV PPM.  # Paroxsymal atrial fibrillation: Consider antiarrhythmic with Dr. Caryl Comes as an outpatient.  Continue Eliquis and carvedilol.   # Demand ischemia: Troponin mildly elevated to 0.06 and flat.  For questions or updates, please contact [place] Please consult  [email or web address] for contact info under Cardiology/STEMI.      Time spent: 45 minutes-Greater than 50% of this time was spent in counseling, explanation of diagnosis, planning of further management, and coordination of care.  Signed, Skeet Latch, MD  06/22/2017, 9:11 AM

## 2017-06-22 NOTE — Progress Notes (Addendum)
Pt is a 67 y.o.femalewith medical history significant ofchronic systolic CHF EF of 61%, COPD, PAF on Eliquis, cluster headaches,HOCM, CKD3, depression, anxiety. She presented to nurse with CP, SOB and nausea. States that it feels like an elephant on her chest. VSS with sats in low 90's on RA, lung sounds are clear but diminished at bases. RN advised to discontinue fluids, maintain pt on 3L [place], Troponin and ECG ordered.  Received an add'l phone call from RN at approximately 0030 with c/o of pt c/o increasing SOB, sats in low 70s, n/v, and lethargy. Pt placed on bipap, stat chest xray ordered, lasix given, ABG ordered. Advised RN to call with ABG results.  Shortness of Breath -ABG ordered. Ph noted to be 7.09, CO2 60,O2 80 - Chest xray ordered. Worsening congestion noted.  - Lasix 40mg  IV given. Foley placed for aggressive diuresis. - CCM consulted and agrees pt should be monitored on bipap for now and transferred to SDU for further monitoring.  - Pt placed on Bipap. RT to adjust settings. Will redraw ABG after hour on bipap - Transfer to SDU for further monitoring.   Lovey Newcomer, NP [facility] hospitalist  [phone]

## 2017-06-22 NOTE — Progress Notes (Addendum)
This nurse had conversation with daughter on phone.  She states her mother lives alone / has been separated from her husband for several years.  She also states that her mother is "a heavy drinker".  She did have elevated LFTs on admission.  ? Cause.  She also stated to me that the patient has not been truthful re:  Her drinking to medical staff and that she would be very upset with daughter if she knew this information was divulged by her.  I assured the daughter that the information would be passed on only to those who required it for her care.  The daughter also states that on her day of admission, the patient had a syncopal episode and, when the daughter retrieved her car, there was a container of alcohol in the front of the car.  Aforementioned information was passed on to Dr Vaughan Browner.

## 2017-06-22 NOTE — Progress Notes (Signed)
Had conversation with patient re:  Social history.  In addition to telling me initially that she did not smoke or drink, then corrected herself, stating she goes out "maybe twice a month with her friends for a drink".  Her daughter arrived on the unit to visit and had conversation with me.  She told me that her mother "drank on a daily basis" and smoked heavily.  I had discussed with the patient that it the answers to these questions were important as it may determine how the treatment plan would unfold going forward.

## 2017-06-23 DIAGNOSIS — R55 Syncope and collapse: Principal | ICD-10-CM

## 2017-06-23 DIAGNOSIS — E860 Dehydration: Secondary | ICD-10-CM

## 2017-06-23 LAB — GLUCOSE, CAPILLARY
GLUCOSE-CAPILLARY: 102 mg/dL — AB (ref 65–99)
GLUCOSE-CAPILLARY: 88 mg/dL (ref 65–99)
GLUCOSE-CAPILLARY: 115 mg/dL — AB (ref 65–99)
Glucose-Capillary: 85 mg/dL (ref 65–99)
Glucose-Capillary: 113 mg/dL — ABNORMAL HIGH (ref 65–99)

## 2017-06-23 LAB — CBC
HEMATOCRIT: 29.9 % — AB (ref 36.0–46.0)
HEMOGLOBIN: 9.6 g/dL — AB (ref 12.0–15.0)
MCH: 32.9 pg (ref 26.0–34.0)
MCHC: 32.1 g/dL (ref 30.0–36.0)
MCV: 102.4 fL — AB (ref 78.0–100.0)
Platelets: 107 10*3/uL — ABNORMAL LOW (ref 150–400)
RBC: 2.92 MIL/uL — ABNORMAL LOW (ref 3.87–5.11)
RDW: 14.7 % (ref 11.5–15.5)
WBC: 8.3 10*3/uL (ref 4.0–10.5)

## 2017-06-23 LAB — HEPARIN LEVEL (UNFRACTIONATED): Heparin Unfractionated: 2.02 IU/mL — ABNORMAL HIGH (ref 0.30–0.70)

## 2017-06-23 LAB — APTT
APTT: 77 s — AB (ref 24–36)
aPTT: 60 seconds — ABNORMAL HIGH (ref 24–36)

## 2017-06-23 MED ORDER — ACETAMINOPHEN 325 MG PO TABS
650.0000 mg | ORAL_TABLET | ORAL | Status: DC | PRN
Start: 2017-06-23 — End: 2017-06-24
  Administered 2017-06-23: 650 mg via ORAL
  Filled 2017-06-23: qty 2

## 2017-06-23 MED ORDER — APIXABAN 5 MG PO TABS
5.0000 mg | ORAL_TABLET | Freq: Two times a day (BID) | ORAL | Status: DC
  Administered 2017-06-23 – 2017-06-24 (×3): 5 mg via ORAL
  Filled 2017-06-23 (×3): qty 1

## 2017-06-23 MED ORDER — IPRATROPIUM-ALBUTEROL 0.5-2.5 (3) MG/3ML IN SOLN
3.0000 mL | Freq: Three times a day (TID) | RESPIRATORY_TRACT | Status: DC
  Administered 2017-06-23 – 2017-06-24 (×5): 3 mL via RESPIRATORY_TRACT
  Filled 2017-06-23 (×6): qty 3

## 2017-06-23 MED ORDER — METHYLPREDNISOLONE SODIUM SUCC 40 MG IJ SOLR
40.0000 mg | Freq: Two times a day (BID) | INTRAMUSCULAR | Status: DC
Start: 2017-06-23 — End: 2017-06-23

## 2017-06-23 MED ORDER — ALPRAZOLAM 0.25 MG PO TABS
0.2500 mg | ORAL_TABLET | Freq: Two times a day (BID) | ORAL | Status: DC | PRN
  Administered 2017-06-23 – 2017-06-24 (×2): 0.25 mg via ORAL
  Filled 2017-06-23 (×2): qty 1

## 2017-06-23 NOTE — Progress Notes (Signed)
ANTICOAGULATION CONSULT NOTE - Follow Up Consult  Pharmacy Consult for heparin Indication: atrial fibrillation  Labs: Recent Labs    06/20/17 1644  06/21/17 0143 06/21/17 0736 06/21/17 2058 06/22/17 0230 06/22/17 0807 06/22/17 1840 06/23/17 0302  HGB 13.4  --   --  11.9*  --  12.5  --   --  9.6*  HCT 40.6  --   --  36.3  --  40.0  --   --  29.9*  PLT 190  --   --  158  --  169  --   --  PENDING  APTT  --   --   --   --   --   --  22* 115* 77*  HEPARINUNFRC  --   --   --   --   --   --  2.10*  --   --   CREATININE 1.53*  --   --  1.21*  --  1.49*  --   --   --   TROPONINI 0.03*   < > 0.06* 0.04* 0.05*  --   --   --   --    < > = values in this interval not displayed.    Assessment/Plan:  67yo female therapeutic on heparin after rate change. Will continue gtt at current rate and confirm stable with additional PTT.   Wynona Neat, PharmD, BCPS  06/23/2017,4:17 AM

## 2017-06-23 NOTE — Progress Notes (Addendum)
ANTICOAGULATION CONSULT NOTE -follow up Pharmacy Consult for heparin Indication: atrial fibrillation  Allergies  Allergen Reactions  . Prednisone Other (See Comments)    SOB, CHF     Patient Measurements: Height: 5\' 1"  (154.9 cm) Weight: 130 lb 8.2 oz (59.2 kg) IBW/kg (Calculated) : 47.8 Heparin Dosing Weight: 56.4 kg  Vital Signs: Temp: 97.7 F (36.5 C) (06/16 1100) Temp Source: Oral (06/16 1100) BP: 93/65 (06/16 1000) Pulse Rate: 64 (06/16 1000)  Labs: Recent Labs    06/20/17 1644  06/21/17 0143 06/21/17 0736 06/21/17 2058 06/22/17 0230  06/22/17 0807 06/22/17 1840 06/23/17 0302 06/23/17 1054  HGB 13.4  --   --  Angela.9*  --  12.5  --   --   --  9.6*  --   HCT 40.6  --   --  36.3  --  40.0  --   --   --  29.9*  --   PLT 190  --   --  158  --  169  --   --   --  107*  --   APTT  --   --   --   --   --   --    < > 22* 115* 77* 60*  HEPARINUNFRC  --   --   --   --   --   --   --  2.10*  --  2.02*  --   CREATININE 1.53*  --   --  1.21*  --  1.49*  --   --   --   --   --   TROPONINI 0.03*   < > 0.06* 0.04* 0.05*  --   --   --   --   --   --    < > = values in this interval not displayed.    Estimated Creatinine Clearance: 31.1 mL/min (A) (by C-G formula based on SCr of 1.49 mg/dL (H)).   Medical History: Past Medical History:  Diagnosis Date  . Bradycardia    a. initial PPM placed 1994 at [facility] with gen change 2002. b. 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 Jude BiV PPM 05/08/12.  . Chronic systolic CHF (congestive heart failure) ([place])    a. EF 30-35% dx in 2011 -> most recent 40-45% by echo 04/2012. b. s/p upgrade to BiV-PPM 05/08/12. c. Med titration limited by hypotension.  . Cluster headache syndrome   . Hypertr obst cardiomyop   . Hypokalemia   . Hypotension   . Paroxysmal atrial fibrillation (HCC)    a. identified on PPM interrogation b. longest episode 1 hour; if burden increases, will need [place]   . Pneumonia 2008    Medications:   Medications Prior to Admission  Medication Sig Dispense Refill Last Dose  . albuterol (PROVENTIL HFA;VENTOLIN HFA) 108 (90 Base) MCG/ACT inhaler Inhale 1-2 puffs into the lungs every 6 (six) hours as needed for wheezing or shortness of breath. 1 Inhaler 1 06/20/2017 at Unknown time  . ALPRAZolam (XANAX) 0.25 MG tablet Take 1 tablet (0.25 mg total) by mouth 2 (two) times daily as needed for anxiety. 30 tablet 3 06/20/2017 at Unknown time  . AMBULATORY NON FORMULARY MEDICATION Medication Name: 100% O2 15 leters a min for 15-20 mins  Via Nasal cannual (Patient taking differently: Medication Name: 100% O2 15 leters a min for 15-20 mins as needed  Via Nasal cannual) 1 Bottle 2 unknown at prn  . amitriptyline (ELAVIL) 10 MG  tablet Take 10 mg by mouth as needed (headaches).    unknown  . apixaban (ELIQUIS) 5 MG TABS tablet Take 1 tablet (5 mg total) by mouth 2 (two) times daily. 60 tablet 3 06/19/2017 at 1500  . aspirin 81 MG chewable tablet Chew 81 mg by mouth daily.   06/19/2017 at Unknown time  . carvedilol (COREG) 12.5 MG tablet TAKE 1 TABLET BY MOUTH 2 TIMES DAILY WITH A MEAL. 60 tablet 3 06/19/2017 at 1500  . cyclobenzaprine (FLEXERIL) 5 MG tablet TAKE 1 TABLET (5 MG TOTAL) BY MOUTH 3 (THREE) TIMES DAILY AS NEEDED FOR MUSCLE SPASMS. 30 tablet 2 unknown  . diclofenac sodium (VOLTAREN) 1 % GEL Apply 2 g topically 4 (four) times daily. (Patient taking differently: Apply 2 g topically 4 (four) times daily as needed (pain). ) 3 Tube 0 unknown  . FLUoxetine (PROZAC) 20 MG capsule Take 1 capsule (20 mg total) by mouth daily. 30 capsule 3 06/19/2017 at Unknown time  . fluticasone (FLONASE) 50 MCG/ACT nasal spray PLACE 2 SPRAYS INTO BOTH NOSTRILS DAILY. (Patient taking differently: Place 2 sprays into both nostrils daily as needed for allergies. ) 16 g 5 unknown  . ipratropium-albuterol (DUONEB) 0.5-2.5 (3) MG/3ML SOLN USE 1 VIAL (3ML) BY NEBULIZER EVERY 4 TO 6 HOURS AS NEEDED (Patient taking differently: Take 3 mLs  by nebulization every 4 (four) hours as needed (shortness of breath). ) 90 mL 0 Taking  . loratadine (CLARITIN) 10 MG tablet Take 1 tablet (10 mg total) by mouth daily. 30 tablet 5 06/19/2017 at Unknown time  . losartan (COZAAR) 25 MG tablet Take 1 tablet (25 mg total) by mouth 2 (two) times daily. 60 tablet 6 06/19/2017 at Unknown time  . nicotine (NICODERM CQ - DOSED IN MG/24 HOURS) 14 mg/24hr patch Place 1 patch (14 mg total) onto the skin daily. (Patient taking differently: Place 14 mg onto the skin daily as needed (nicotine withdrawal). ) 28 patch 0 unknown  . potassium chloride SA (K-DUR,KLOR-CON) 20 MEQ tablet Take 1 tablet (20 mEq total) by mouth daily. 90 tablet 3 06/19/2017 at Unknown time  . Spacer/Aero-Holding Chambers (AEROCHAMBER MV) inhaler Use as instructed with Symbicort 1 each 0 06/19/2017 at Unknown time  . spironolactone (ALDACTONE) 25 MG tablet Take 1 tablet (25 mg total) by mouth daily. 30 tablet Angela 06/19/2017 at Unknown time  . SUMAtriptan (IMITREX) 50 MG tablet TAKE 1 TABLET AT ONSET OF HEADACHE. MAY REPEAT IN 2 HOURS IF HEADACHE PERSISTS OR RECURS. (Patient taking differently: TAKE 1 TABLET (50MG ) AS NEEDED AT ONSET OF HEADACHE. MAY REPEAT IN 2 HOURS IF HEADACHE PERSISTS OR RECURS.) 10 tablet 6 unknown  . SYMBICORT 160-4.5 MCG/ACT inhaler INHALE 2 PUFFS BY MOUTH INTO THE LUNGS 2 TIMES A DAY 10.2 g 3 06/19/2017 at Unknown time  . topiramate (TOPAMAX) 50 MG tablet Take 50 mg by mouth daily.   06/19/2017 at Unknown time  . torsemide (DEMADEX) 20 MG tablet Take 1 tablet (20 mg total) by mouth 2 (two) times daily. (Patient taking differently: Take 40 mg by mouth 2 (two) times daily. ) 60 tablet 6 06/20/2017 at Unknown time    Assessment: Angela Shepherd on heparin for AFib while holding Eliquis in ICU. APTT therapeutic at 60 seconds, heparin level still affected by DOAC. Hgb down, no bleeding noted by RN other than small amount of oozing at IV site.  Goal of Therapy:  Heparin level 0.3-.07  units/ml aPTT 66-102 seconds Monitor platelets by anticoagulation protocol: Yes  Plan:  Continue IV heparin 650 units/hr Check aPTT/heparin level tomorrow morning Consider transitioning back to Eliquis once out of ICU  ADDENDUM: Transition back to Eliquis. SCr 1.49 today, Wt barely <60kg but age <53. Pt on 5mg  BID at home - will resume this for now and watch Cr closely.  Plan: Stop UFH Apixaban 5mg  BID   Arrie Senate, PharmD, BCPS PGY-2 Cardiology Pharmacy Resident Phone: [phone] 06/23/2017

## 2017-06-23 NOTE — Progress Notes (Signed)
Progress Note  Patient Name: Angela Shepherd Date of Encounter: 06/23/2017  Primary Cardiologist: No primary care provider on file.   Subjective   Feeling well.  Breathing back to baseline. Eager to walk.   Inpatient Medications    Scheduled Meds: . arformoterol  15 mcg Nebulization BID  . aspirin  81 mg Oral Daily  . carvedilol  6.25 mg Oral BID WC  . chlorhexidine  15 mL Mouth Rinse BID  . FLUoxetine  20 mg Oral Daily  . insulin aspart  0-15 Units Subcutaneous Q4H  . ipratropium-albuterol  3 mL Nebulization TID  . loratadine  10 mg Oral Daily  . mouth rinse  15 mL Mouth Rinse q12n4p  . sodium chloride flush  3 mL Intravenous Q12H  . topiramate  50 mg Oral Daily  . torsemide  20 mg Oral BID   Continuous Infusions: . sodium chloride 10 mL/hr (06/23/17 0800)  . heparin 650 Units/hr (06/23/17 0800)   PRN Meds: albuterol, ALPRAZolam, amitriptyline, dextromethorphan-guaiFENesin, diclofenac sodium, fluticasone, nitroGLYCERIN, ondansetron (ZOFRAN) IV, SUMAtriptan   Vital Signs    Vitals:   06/23/17 0600 06/23/17 0700 06/23/17 0800 06/23/17 0914  BP: (!) 95/56 (!) 87/58 (!) 97/51   Pulse: 72 64 69   Resp: 14 20 15    Temp:      TempSrc:      SpO2: 98% 98% 97% 98%  Weight: 130 lb 8.2 oz (59.2 kg)     Height:        Intake/Output Summary (Last 24 hours) at 06/23/2017 1041 Last data filed at 06/23/2017 0800 Gross per 24 hour  Intake 1235.17 ml  Output 1805 ml  Net -569.83 ml   Filed Weights   06/21/17 0500 06/22/17 0300 06/23/17 0600  Weight: 128 lb 6.4 oz (58.2 kg) 124 lb 5.4 oz (56.4 kg) 130 lb 8.2 oz (59.2 kg)    Telemetry    ASVP.   Personally Reviewed  ECG    AS BiV paced.  Rate 70 bpm- Personally Reviewed  Physical Exam   VS:  BP (!) 97/51 (BP Location: Left Arm)   Pulse 69   Temp 98.6 F (37 C) (Oral)   Resp 15   Ht 5\' 1"  (1.549 m)   Wt 130 lb 8.2 oz (59.2 kg)   LMP  (LMP Unknown)   SpO2 98%   BMI 24.66 kg/m  , BMI Body mass index is 24.66  kg/m. GENERAL:  Well-appearing HEENT: Pupils equal round and reactive, fundi not visualized, oral mucosa unremarkable NECK:  No JVD.  waveform within normal limits, carotid upstroke brisk and symmetric, no bruits LUNGS:  Clear to auscultation bilaterally.  No crackles, wheezes or rhonchi.  HEART:  RRR.  PMI not displaced or sustained,S1 and S2 within normal limits, no S3, no S4, no clicks, no rubs, no murmurs ABD:  Flat, positive bowel sounds normal in frequency in pitch, no bruits, no rebound, no guarding, no midline pulsatile mass, no hepatomegaly, no splenomegaly EXT:  2 plus pulses throughout, no edema, no cyanosis no clubbing SKIN:  No rashes no nodules NEURO:  Cranial nerves II through XII grossly intact, motor grossly intact throughout [facility]:  Cognitively intact, oriented to person place and time   Labs    Chemistry Recent Labs  Lab 06/20/17 1644 06/21/17 0736 06/22/17 0230  NA 138 139 139  K 4.7 4.1 4.7  CL 105 110 109  CO2 18* 19* 21*  GLUCOSE 225* 190* 230*  BUN 31* 29* 30*  CREATININE 1.53* 1.21* 1.49*  CALCIUM 8.8* 8.0* 7.7*  PROT 6.4*  --   --   ALBUMIN 3.9  --   --   AST 117*  --   --   ALT 71*  --   --   ALKPHOS 99  --   --   BILITOT 0.9  --   --   GFRNONAA 35* 46* 36*  GFRAA 40* 53* 41*  ANIONGAP 15 10 9      Hematology Recent Labs  Lab 06/21/17 0736 06/22/17 0230 06/23/17 0302  WBC 7.5 18.5* 8.3  RBC 3.63* 3.83* 2.92*  HGB 11.9* 12.5 9.6*  HCT 36.3 40.0 29.9*  MCV 100.0 104.4* 102.4*  MCH 32.8 32.6 32.9  MCHC 32.8 31.3 32.1  RDW 14.3 14.6 14.7  PLT 158 169 107*    Cardiac Enzymes Recent Labs  Lab 06/20/17 2106 06/21/17 0143 06/21/17 0736 06/21/17 2058  TROPONINI 0.06* 0.06* 0.04* 0.05*   No results for input(s): TROPIPOC in the last 168 hours.   BNP Recent Labs  Lab 06/20/17 1644  BNP 781.6*     DDimer  Recent Labs  Lab 06/22/17 0230  DDIMER 2.94*     Radiology    Dg Chest Port 1 View  Result Date:  06/22/2017 CLINICAL DATA:  Respiratory distress EXAM: PORTABLE CHEST 1 VIEW COMPARISON:  06/20/2016, CT chest 03/02/2017 FINDINGS: Left-sided pacing device as before. Cardiomegaly with mild vascular congestion. Diffuse interstitial opacity, likely chronic scarring and fibrosis. Blunting of the right CP angle consistent with pleural scarring. Similar appearance of right peripheral pulmonary scarring. Probable skin fold artifact at the right apex. IMPRESSION: 1. Cardiomegaly with mild central congestion 2. Similar appearance of pleural and parenchymal scarring in the right thorax 3. Suspected skin fold artifacts at the lung apices. Electronically Signed   By: Donavan Foil M.D.   On: 06/22/2017 01:11    Cardiac Studies   Echo 05/21/17:  Study Conclusions  - Left ventricle: Wall thickness was increased in a pattern of mild   LVH. Systolic function was moderately reduced. The estimated   ejection fraction was in the range of 35% to 40%. Severe   hypokinesis of the mid-apicalanteroseptal, lateral,   inferolateral, and apical myocardium. - Ventricular septum: Septal motion showed abnormal function and   dyssynergy. - Aortic valve: There was mild regurgitation. - Mitral valve: There was mild to moderate regurgitation. - Left atrium: The atrium was moderately dilated. - Pulmonary arteries: Systolic pressure was moderately increased.   PA peak pressure: 41 mm Hg (S).  Patient Profile     67 y.o. female with chronic systolic and diastolic heart failure, paroxysmal atrial fibrillation, bradycardia, HOCM, CKD 3, here with syncope, chest pain and shortness of breath.  Assessment & Plan    # Syncope: Thought to be due to overdiuresis in the setting of confusion about her torsemide dose.  Per patient device interrogation showed atrial fibrillation at the time of her syncopal episode.  Interrogation not available at this time.  No ventricular arrhythmias.  Telemetry has been stable.   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  Wall thickness 1.1cm now and no evidence of obstruction or SAM on echo, though she did have septal wall thickness measured at 1.7cm in the past.  No ventricular arrhythmias.  Continue carvedilol.   # Acute on chronic systolic and diastolic heart failure: Patient became volume overloaded and required bipap on 6/15 after holding diuretics for overdiuresis. She is doing better  after IV lasix and resumed home torsemide 6/15.  BMP pending.  She appears euvolemic.  Continue carvedilol.  She has a [employer] BiV PPM.  Home losartan and spironolactone on hold 2/2 AKI and hypotension.  Can resume when renal function and BP tolerate.  Home carvedilol has been reduced.  Echo unchanged from prior.  # Bradycardia:  S/p BiV PPM.  # Paroxsymal atrial fibrillation: Consider antiarrhythmic with Dr. Caryl Comes as an outpatient.  Continue Eliquis and carvedilol.   # Demand ischemia: Troponin mildly elevated to 0.06 and flat.  For questions or updates, please contact [place] Please consult [email or web address] for contact info under Cardiology/STEMI.        Signed, Skeet Latch, MD  06/23/2017, 10:41 AM

## 2017-06-23 NOTE — Progress Notes (Addendum)
PULMONARY / CRITICAL CARE MEDICINE   Name: Angela Shepherd MRN: [id number] DOB: 03-29-1950    ADMISSION DATE:  06/20/2017 CONSULTATION DATE:  06/22/2017  REFERRING MD:  Kennon Holter, NP  CHIEF COMPLAINT:  Respiratory Distress   HISTORY OF PRESENT ILLNESS:   67 year old female with PMH of Bradycardia, Systolic HF (EF 44-03 with mild LVH, and moderately reduced systolic function), Hypotension, A.Fib on Eliquis, COPD, CKD stage 3   Presents to ED on 6/13 dyspnea, nausea, and syncope. Was at drug store when she passed out, complains of mild Chest Pain. Upon arrival to ED was noted to have wheezing, given bronchodilator. BP 71/57 which improved after 500 cc bolus normal saline. BNP 781. WBC 4.5. Troponin 0.03. Admitted to telemetry. Cardiology consulted. Believed syncope secondary to hypotension due to over diuresis.   On 6/15 patient decompensated. Noted to be hypoxic with chest pain. CXR with worsening vascular congestion. ABG 7.09/60.9/60.3. Given 40 meq lasix and placed on BiPAP. ABG 7.08/73.5/80.4. Patient transferred to ICU for further management.   PAST MEDICAL HISTORY :  She  has a past medical history of Bradycardia, Chronic systolic CHF (congestive heart failure) ([place]), Cluster headache syndrome, Hypertr obst cardiomyop, Hypokalemia, Hypotension, Paroxysmal atrial fibrillation ([place]), and Pneumonia (2008).  PAST SURGICAL HISTORY: She  has a past surgical history that includes Tonsillectomy (~ 1959); Tubal ligation (1984?); Pacemaker insertion (1994; 2002; 05/08/2012); and bi-ventricular pacemaker upgrade (N/A, 05/08/2012).  Allergies  Allergen Reactions  . Prednisone Other (See Comments)    SOB, CHF     No current facility-administered medications on file prior to encounter.    Current Outpatient Medications on File Prior to Encounter  Medication Sig  . albuterol (PROVENTIL HFA;VENTOLIN HFA) 108 (90 Base) MCG/ACT inhaler Inhale 1-2 puffs into the lungs every 6 (six) hours as needed for  wheezing or shortness of breath.  . ALPRAZolam (XANAX) 0.25 MG tablet Take 1 tablet (0.25 mg total) by mouth 2 (two) times daily as needed for anxiety.  . AMBULATORY NON FORMULARY MEDICATION Medication Name: 100% O2 15 leters a min for 15-20 mins  Via Nasal cannual (Patient taking differently: Medication Name: 100% O2 15 leters a min for 15-20 mins as needed  Via Nasal cannual)  . amitriptyline (ELAVIL) 10 MG tablet Take 10 mg by mouth as needed (headaches).   Marland Kitchen apixaban (ELIQUIS) 5 MG TABS tablet Take 1 tablet (5 mg total) by mouth 2 (two) times daily.  Marland Kitchen aspirin 81 MG chewable tablet Chew 81 mg by mouth daily.  . carvedilol (COREG) 12.5 MG tablet TAKE 1 TABLET BY MOUTH 2 TIMES DAILY WITH A MEAL.  . cyclobenzaprine (FLEXERIL) 5 MG tablet TAKE 1 TABLET (5 MG TOTAL) BY MOUTH 3 (THREE) TIMES DAILY AS NEEDED FOR MUSCLE SPASMS.  Marland Kitchen diclofenac sodium (VOLTAREN) 1 % GEL Apply 2 g topically 4 (four) times daily. (Patient taking differently: Apply 2 g topically 4 (four) times daily as needed (pain). )  . FLUoxetine (PROZAC) 20 MG capsule Take 1 capsule (20 mg total) by mouth daily.  . fluticasone (FLONASE) 50 MCG/ACT nasal spray PLACE 2 SPRAYS INTO BOTH NOSTRILS DAILY. (Patient taking differently: Place 2 sprays into both nostrils daily as needed for allergies. )  . ipratropium-albuterol (DUONEB) 0.5-2.5 (3) MG/3ML SOLN USE 1 VIAL (3ML) BY NEBULIZER EVERY 4 TO 6 HOURS AS NEEDED (Patient taking differently: Take 3 mLs by nebulization every 4 (four) hours as needed (shortness of breath). )  . loratadine (CLARITIN) 10 MG tablet Take 1 tablet (10 mg  total) by mouth daily.  Marland Kitchen losartan (COZAAR) 25 MG tablet Take 1 tablet (25 mg total) by mouth 2 (two) times daily.  . nicotine (NICODERM CQ - DOSED IN MG/24 HOURS) 14 mg/24hr patch Place 1 patch (14 mg total) onto the skin daily. (Patient taking differently: Place 14 mg onto the skin daily as needed (nicotine withdrawal). )  . potassium chloride SA (K-DUR,KLOR-CON) 20  MEQ tablet Take 1 tablet (20 mEq total) by mouth daily.  Marland Kitchen Spacer/Aero-Holding Chambers (AEROCHAMBER MV) inhaler Use as instructed with Symbicort  . spironolactone (ALDACTONE) 25 MG tablet Take 1 tablet (25 mg total) by mouth daily.  . SUMAtriptan (IMITREX) 50 MG tablet TAKE 1 TABLET AT ONSET OF HEADACHE. MAY REPEAT IN 2 HOURS IF HEADACHE PERSISTS OR RECURS. (Patient taking differently: TAKE 1 TABLET (50MG ) AS NEEDED AT ONSET OF HEADACHE. MAY REPEAT IN 2 HOURS IF HEADACHE PERSISTS OR RECURS.)  . SYMBICORT 160-4.5 MCG/ACT inhaler INHALE 2 PUFFS BY MOUTH INTO THE LUNGS 2 TIMES A DAY  . topiramate (TOPAMAX) 50 MG tablet Take 50 mg by mouth daily.  Marland Kitchen torsemide (DEMADEX) 20 MG tablet Take 1 tablet (20 mg total) by mouth 2 (two) times daily. (Patient taking differently: Take 40 mg by mouth 2 (two) times daily. )    FAMILY HISTORY:  Her indicated that her mother is alive. She indicated that her father is deceased. She indicated that only one of her three sisters is alive. She indicated that the status of her brother is unknown. She indicated that her maternal grandmother is deceased. She indicated that her maternal grandfather is deceased. She indicated that her paternal grandmother is deceased. She indicated that her paternal grandfather is deceased. She indicated that the status of her neg hx is unknown. She indicated that the status of her unknown relative is unknown.   SOCIAL HISTORY: She  reports that she has quit smoking. She smoked 3.00 packs per day. She has never used smokeless tobacco. She reports that she drinks about 1.2 oz of alcohol per week. She reports that she does not use drugs.  REVIEW OF SYSTEMS:   All negative; except for those that are bolded, which indicate positives.  Constitutional: weight loss, weight gain, night sweats, fevers, chills, fatigue, weakness.  HEENT: headaches, sore throat, sneezing, nasal congestion, post nasal drip, difficulty swallowing, tooth/dental problems,  visual complaints, visual changes, ear aches. Neuro: difficulty with speech, weakness, numbness, ataxia. CV:  chest pain, orthopnea, PND, swelling in lower extremities, dizziness, palpitations, syncope.  Resp: cough, hemoptysis, dyspnea, wheezing. GI: heartburn, indigestion, abdominal pain, nausea, vomiting, diarrhea, constipation, change in bowel habits, loss of appetite, hematemesis, melena, hematochezia.  GU: dysuria, change in color of urine, urgency or frequency, flank pain, hematuria. MSK: joint pain or swelling, decreased range of motion. Psych: change in mood or affect, depression, anxiety, suicidal ideations, homicidal ideations. Skin: rash, itching, bruising.  SUBJECTIVE:  Stable off BiPAP since yesterday.  Sitting up in chair with no complaints.  VITAL SIGNS: BP (!) 97/51 (BP Location: Left Arm)   Pulse 69   Temp 98.6 F (37 C) (Oral)   Resp 15   Ht 5\' 1"  (1.549 m)   Wt 59.2 kg (130 lb 8.2 oz)   LMP  (LMP Unknown)   SpO2 98%   BMI 24.66 kg/m   HEMODYNAMICS:    VENTILATOR SETTINGS:    INTAKE / OUTPUT: I/O last 3 completed shifts: In: 1242.2 [P.O.:920; I.V.:322.2] Out: 2030 [Urine:2030]  PHYSICAL EXAMINATION: Gen:  No acute distress HEENT:  EOMI, sclera anicteric Neck:     No masses; no thyromegaly Lungs:    Clear to auscultation bilaterally; normal respiratory effort CV:         Regular rate and rhythm; no murmurs Abd:      + bowel sounds; soft, non-tender; no palpable masses, no distension Ext:    No edema; adequate peripheral perfusion Skin:      Warm and dry; no rash Neuro: alert and oriented x 3 Psych: normal mood and affect  LABS:  BMET Recent Labs  Lab 06/20/17 1644 06/21/17 0736 06/22/17 0230  NA 138 139 139  K 4.7 4.1 4.7  CL 105 110 109  CO2 18* 19* 21*  BUN 31* 29* 30*  CREATININE 1.53* 1.21* 1.49*  GLUCOSE 225* 190* 230*    Electrolytes Recent Labs  Lab 06/20/17 1644 06/21/17 0736 06/22/17 0230  CALCIUM 8.8* 8.0* 7.7*     CBC Recent Labs  Lab 06/21/17 0736 06/22/17 0230 06/23/17 0302  WBC 7.5 18.5* 8.3  HGB 11.9* 12.5 9.6*  HCT 36.3 40.0 29.9*  PLT 158 169 107*    Coag's Recent Labs  Lab 06/22/17 0807 06/22/17 1840 06/23/17 0302  APTT 22* 115* 77*    Sepsis Markers Recent Labs  Lab 06/22/17 0243 06/22/17 0807  LATICACIDVEN 1.6 1.5  PROCALCITON 0.66  --     ABG Recent Labs  Lab 06/22/17 0055 06/22/17 0215 06/22/17 0623  PHART 7.091* 7.082* 7.300*  PCO2ART 60.9* 73.5* 38.7  PO2ART 60.3* 80.4* 89.0    Liver Enzymes Recent Labs  Lab 06/20/17 1644  AST 117*  ALT 71*  ALKPHOS 99  BILITOT 0.9  ALBUMIN 3.9    Cardiac Enzymes Recent Labs  Lab 06/21/17 0143 06/21/17 0736 06/21/17 2058  TROPONINI 0.06* 0.04* 0.05*    Glucose Recent Labs  Lab 06/22/17 0754 06/22/17 1201 06/22/17 1649 06/22/17 2108 06/22/17 2331 06/23/17 0318  GLUCAP 125* 115* 139* 99 108* 102*    Imaging No results found.  STUDIES:  CT Head 6/14 > Negative.  No CT evidence for acute intracranial abnormality CXR 6/15> cardiomegaly, mild edema, chronic scarring the right lung.  I have reviewed the images personally.  CULTURES: None.   ANTIBIOTICS: None.   SIGNIFICANT EVENTS: 6/13 > Presents to ED  6/15 > Transferred to ICU   LINES/TUBES: PIV   DISCUSSION: 67 year old with COPD [FEV1 62%], heart failure, A. fib, chronic kidney disease admitted with dyspnea, syncope, hypotension.  Thought to be due to over diuresis and diuretics were held..  Decompensated 6/15 with hypercarbic respiratory failure and transferred to ICU on BiPAP.  ASSESSMENT / PLAN: Hypercarbic respiratory failure, COPD Continue Pulmicort, Brovana nebs, duo nebs as needed Can stop steroids as she is not wheezing today  Bradycardia, systolic heart failure, cardiomyopathy, A fib Cardiology on board Continue coreg, torsemide, heparin drip for A. Fib. Can transition to home eliquis  CKD Monitor urine output  and creatinine.  Transminitis in setting of decompensated heart failure vs hypoperfusion  Reported to have daily alcohol use although patient denies Plan  Repeat LFTs in a.m.  Anxiety Resume xanax Continue prozac, elavil prn   FAMILY  - Updates: patient updated 6/16 - Inter-disciplinary family meet or Palliative Care meeting due by: 06/29/2017    Stable for transfer from ICU  Marshell Garfinkel MD [facility] Pager [phone] If no answer or after 3pm call: [phone] 06/23/2017, 9:57 AM

## 2017-06-24 ENCOUNTER — Inpatient Hospital Stay (HOSPITAL_COMMUNITY): Payer: 59

## 2017-06-24 ENCOUNTER — Encounter: Payer: 59 | Admitting: Physician Assistant

## 2017-06-24 DIAGNOSIS — F329 Major depressive disorder, single episode, unspecified: Secondary | ICD-10-CM

## 2017-06-24 DIAGNOSIS — F419 Anxiety disorder, unspecified: Secondary | ICD-10-CM

## 2017-06-24 DIAGNOSIS — I5042 Chronic combined systolic (congestive) and diastolic (congestive) heart failure: Secondary | ICD-10-CM

## 2017-06-24 DIAGNOSIS — I421 Obstructive hypertrophic cardiomyopathy: Secondary | ICD-10-CM

## 2017-06-24 DIAGNOSIS — J449 Chronic obstructive pulmonary disease, unspecified: Secondary | ICD-10-CM

## 2017-06-24 DIAGNOSIS — N179 Acute kidney failure, unspecified: Secondary | ICD-10-CM

## 2017-06-24 DIAGNOSIS — N183 Chronic kidney disease, stage 3 (moderate): Secondary | ICD-10-CM

## 2017-06-24 LAB — COMPREHENSIVE METABOLIC PANEL
ALBUMIN: 2.9 g/dL — AB (ref 3.5–5.0)
ALT: 55 U/L — AB (ref 14–54)
AST: 20 U/L (ref 15–41)
Alkaline Phosphatase: 49 U/L (ref 38–126)
Anion gap: 8 (ref 5–15)
BUN: 34 mg/dL — AB (ref 6–20)
CHLORIDE: 109 mmol/L (ref 101–111)
CO2: 24 mmol/L (ref 22–32)
CREATININE: 1.19 mg/dL — AB (ref 0.44–1.00)
Calcium: 7.7 mg/dL — ABNORMAL LOW (ref 8.9–10.3)
GFR calc Af Amer: 54 mL/min — ABNORMAL LOW (ref >60)
GFR calc non Af Amer: 47 mL/min — ABNORMAL LOW (ref >60)
GLUCOSE: 86 mg/dL (ref 65–99)
Potassium: 3.1 mmol/L — ABNORMAL LOW (ref 3.5–5.1)
SODIUM: 141 mmol/L (ref 135–145)
Total Bilirubin: 0.6 mg/dL (ref 0.3–1.2)
Total Protein: 5.4 g/dL — ABNORMAL LOW (ref 6.5–8.1)

## 2017-06-24 LAB — GLUCOSE, CAPILLARY
GLUCOSE-CAPILLARY: 84 mg/dL (ref 65–99)
Glucose-Capillary: 100 mg/dL — ABNORMAL HIGH (ref 65–99)
Glucose-Capillary: 83 mg/dL (ref 65–99)

## 2017-06-24 LAB — PHOSPHORUS: PHOSPHORUS: 2 mg/dL — AB (ref 2.5–4.6)

## 2017-06-24 LAB — POCT I-STAT 3, ART BLOOD GAS (G3+)
Acid-base deficit: 6 mmol/L — ABNORMAL HIGH (ref 0.0–2.0)
BICARBONATE: 21.3 mmol/L (ref 20.0–28.0)
O2 Saturation: 100 %
PCO2 ART: 49 mmHg — AB (ref 32.0–48.0)
PH ART: 7.247 — AB (ref 7.350–7.450)
PO2 ART: 416 mmHg — AB (ref 83.0–108.0)
TCO2: 23 mmol/L (ref 22–32)

## 2017-06-24 LAB — CBC
HEMATOCRIT: 30.5 % — AB (ref 36.0–46.0)
HEMOGLOBIN: 10.1 g/dL — AB (ref 12.0–15.0)
MCH: 33.4 pg (ref 26.0–34.0)
MCHC: 33.1 g/dL (ref 30.0–36.0)
MCV: 101 fL — ABNORMAL HIGH (ref 78.0–100.0)
Platelets: 114 10*3/uL — ABNORMAL LOW (ref 150–400)
RBC: 3.02 MIL/uL — ABNORMAL LOW (ref 3.87–5.11)
RDW: 14.5 % (ref 11.5–15.5)
WBC: 6.6 10*3/uL (ref 4.0–10.5)

## 2017-06-24 LAB — MAGNESIUM: Magnesium: 2.2 mg/dL (ref 1.7–2.4)

## 2017-06-24 MED ORDER — POTASSIUM CHLORIDE CRYS ER 20 MEQ PO TBCR
40.0000 meq | EXTENDED_RELEASE_TABLET | Freq: Every day | ORAL | Status: DC
  Administered 2017-06-24: 40 meq via ORAL
  Filled 2017-06-24: qty 2

## 2017-06-24 MED ORDER — INSULIN ASPART 100 UNIT/ML ~~LOC~~ SOLN: 0.0000 [IU] | Freq: Three times a day (TID) | SUBCUTANEOUS | Status: DC

## 2017-06-24 MED ORDER — TRAMADOL HCL 50 MG PO TABS: 25.0000 mg | ORAL_TABLET | Freq: Two times a day (BID) | ORAL | 0 refills | Status: AC | PRN

## 2017-06-24 MED ORDER — CARVEDILOL 6.25 MG PO TABS: 6.2500 mg | ORAL_TABLET | Freq: Two times a day (BID) | ORAL | 1 refills | Status: DC

## 2017-06-24 MED FILL — traMADol HCL 50 MG TABS: 50 | 12 days supply | Qty: 12 | Fill #0

## 2017-06-24 NOTE — Discharge Summary (Signed)
Physician Discharge Summary  Angela Shepherd Angela Shepherd DOB: [birth year] DOA: 06/20/2017  PCP: Midge Minium, MD  Admit date: 06/20/2017 Discharge date: 06/24/2017  Admitted From: home Disposition:  home  Recommendations for Outpatient Follow-up:  1. Follow up with CHF clinic in 1 week  Home Health: none Equipment/Devices: none  Discharge Condition: stable CODE STATUS: Full code Diet recommendation: heart healthy, low salt   HPI: Per Dr. Blaine Hamper, Angela Shepherd is a 67 y.o. female with medical history significant of sCHF with EF of 45%, COPD, PAF on Eliquis, cluster headaches, HOCM, CKD 3, depression, anxiety, who presents with syncope.Pt states she suddenly started feeling nauseous, shortness of breath, sweaty when she was in drugstore at about 3:30 PM, the she passed out, she is not sure how long she has passed out. She also has mild CP, which has lately resolved.  Currently patient does not have chest pain, shortness of breath, cough.  No fever or chills. Patient was reportedly to have wheezing and treated with bronchodilator, but currently patient does not have any wheezing on auscultation per my examination.  Patient denies any unilateral weakness or numbness in extremities.  No facial droop or slurred speech.  No symptoms of UTI.  Currently no nausea vomiting, diarrhea or abdominal pain.  Patient was found to have hypotension with blood pressure 71/57, which improved to 90/57, then 103/60 after giving 500 cc normal saline and short period of normal saline infusion at 125 cc/h.   Hospital Course: Syncope -patient was admitted to the hospital after having an episode of weakness, patient denies any syncope and stated that she has felt lightheaded and guided herself to the floor.  She reported to me that she has been taking Lasix and not being able to drink as much, and felt like she was dehydrated.  She reported a 5 pound weight loss with dehydration.  When she was admitted to the  hospital she was given IV fluids for hypotension which placed her into respiratory failure requiring BiPAP placement and transferred to the ICU.  Cardiology was consulted and followed patient while hospitalized.  She was started back on her diuretics with improvement in her respiratory status. Hypotension -patient reports chronic hypotension at baseline, her blood pressure at home ranges between 34-74 systolic, she is asymptomatic, and in fact tells me that she feels a lot better when the blood pressure is low rather than elevated.  Currently blood pressure is in the 25Z 56L systolic today, she has no lightheadedness, no dizziness, able to ambulate without difficulties.  Cardiology evaluated patient and she appears to be stable for discharge.  She will be sent home in stable condition with outpatient follow-up, with discontinuation of her ARB as well as spironolactone until next follow-up.  Her Coreg has also been decreased. Acute hypoxic respiratory failure -due to pulmonary edema in the setting of IV fluids, now resolved with diuresis and she is back on room air Acute on chronic combined systolic and diastolic CHF -most recent 2D echo was done on 06/21/2017 showed an EF of 35 to 40%.  She initially was felt to be dehydrated however with initial IV fluids she developed pulmonary edema.  She appears currently euvolemic, she is able to tolerate her torsemide, her spironolactone has been discontinued by cardiology and will be held on discharge up until she follows up with a heart failure clinic.  Her ARB has also been placed on hold she follows up. Chronic kidney disease stage III -baseline creatinine between 1.1.3,  her creatinine was mildly elevated 1.5 on admission, on discharge has improved to baseline at 1.19.  Her ARB is on hold as above. A. Fib -currently pacemaker in place, continue Coreg, torsemide, anticoagulation with Eliquis Microcytic anemia -hemoglobin stable Elevated LFTs - ?related to  decompensated CHF, improved COPD - stable, no wheezing, resume home medications   Discharge Diagnoses:  Principal Problem:   Syncope Active Problems:   Essential hypertension   COPD (chronic obstructive pulmonary disease) (HCC)   Anxiety and depression   Elevated troponin   Chronic combined systolic (congestive) and diastolic (congestive) heart failure (HCC)   HOCM (hypertrophic obstructive cardiomyopathy) (HCC)   Paroxysmal atrial fibrillation (HCC)   Acute renal failure superimposed on stage 3 chronic kidney disease (HCC)   Hypotension   Abnormal LFTs   Acute respiratory distress   Dehydration     Discharge Instructions   Allergies as of 06/24/2017      Reactions   Prednisone Other (See Comments)   SOB, CHF       Medication List    STOP taking these medications   losartan 25 MG tablet Commonly known as:  COZAAR   spironolactone 25 MG tablet Commonly known as:  ALDACTONE     TAKE these medications   AEROCHAMBER MV inhaler Use as instructed with Symbicort   albuterol 108 (90 Base) MCG/ACT inhaler Commonly known as:  PROVENTIL HFA;VENTOLIN HFA Inhale 1-2 puffs into the lungs every 6 (six) hours as needed for wheezing or shortness of breath.   ALPRAZolam 0.25 MG tablet Commonly known as:  XANAX Take 1 tablet (0.25 mg total) by mouth 2 (two) times daily as needed for anxiety.   AMBULATORY NON FORMULARY MEDICATION Medication Name: 100% O2 15 leters a min for 15-20 mins  Via Nasal cannual What changed:  additional instructions   amitriptyline 10 MG tablet Commonly known as:  ELAVIL Take 10 mg by mouth as needed (headaches).   apixaban 5 MG Tabs tablet Commonly known as:  ELIQUIS Take 1 tablet (5 mg total) by mouth 2 (two) times daily.   aspirin 81 MG chewable tablet Chew 81 mg by mouth daily.   carvedilol 6.25 MG tablet Commonly known as:  COREG Take 1 tablet (6.25 mg total) by mouth 2 (two) times daily with a meal. What changed:    medication  strength  See the new instructions.   cyclobenzaprine 5 MG tablet Commonly known as:  FLEXERIL TAKE 1 TABLET (5 MG TOTAL) BY MOUTH 3 (THREE) TIMES DAILY AS NEEDED FOR MUSCLE SPASMS.   diclofenac sodium 1 % Gel Commonly known as:  VOLTAREN Apply 2 g topically 4 (four) times daily. What changed:    when to take this  reasons to take this   FLUoxetine 20 MG capsule Commonly known as:  PROZAC Take 1 capsule (20 mg total) by mouth daily.   fluticasone 50 MCG/ACT nasal spray Commonly known as:  FLONASE PLACE 2 SPRAYS INTO BOTH NOSTRILS DAILY. What changed:    when to take this  reasons to take this   ipratropium-albuterol 0.5-2.5 (3) MG/3ML Soln Commonly known as:  DUONEB USE 1 VIAL (3ML) BY NEBULIZER EVERY 4 TO 6 HOURS AS NEEDED What changed:    how much to take  how to take this  when to take this  reasons to take this  additional instructions   loratadine 10 MG tablet Commonly known as:  CLARITIN Take 1 tablet (10 mg total) by mouth daily.   nicotine 14 mg/24hr patch Commonly  known as:  NICODERM CQ - dosed in mg/24 hours Place 1 patch (14 mg total) onto the skin daily. What changed:    when to take this  reasons to take this   potassium chloride SA 20 MEQ tablet Commonly known as:  K-DUR,KLOR-CON Take 1 tablet (20 mEq total) by mouth daily.   SUMAtriptan 50 MG tablet Commonly known as:  IMITREX TAKE 1 TABLET AT ONSET OF HEADACHE. MAY REPEAT IN 2 HOURS IF HEADACHE PERSISTS OR RECURS. What changed:  See the new instructions.   SYMBICORT 160-4.5 MCG/ACT inhaler Generic drug:  budesonide-formoterol INHALE 2 PUFFS BY MOUTH INTO THE LUNGS 2 TIMES A DAY   topiramate 50 MG tablet Commonly known as:  TOPAMAX Take 50 mg by mouth daily.   torsemide 20 MG tablet Commonly known as:  DEMADEX Take 1 tablet (20 mg total) by mouth 2 (two) times daily. What changed:  how much to take   traMADol 50 MG tablet Commonly known as:  ULTRAM Take 0.5 tablets (25  mg total) by mouth every 12 (twelve) hours as needed.      Follow-up Information    Bensimhon, Shaune Pascal, MD. Go on 07/09/2017.   Specialty:  Cardiology Why:  Parking [id number] for June.  @9 :30am for hospital follow up Contact information: [address] [place] [address] [phone]           Consultations:  PCCM  Cardiology   Procedures/Studies:  2D echo  Study Conclusions - Left ventricle: Wall thickness was increased in a pattern of mild LVH. Systolic function was moderately reduced. The estimated ejection fraction was in the range of 35% to 40%. Severe hypokinesis of the mid-apicalanteroseptal, lateral, inferolateral, and apical myocardium. - Ventricular septum: Septal motion showed abnormal function and dyssynergy. - Aortic valve: There was mild regurgitation. - Mitral valve: There was mild to moderate regurgitation. - Left atrium: The atrium was moderately dilated. - Pulmonary arteries: Systolic pressure was moderately increased. PA peak pressure: 41 mm Hg (S).  Dg Chest 2 View  Result Date: 06/24/2017 CLINICAL DATA:  Chest pain, shortness of breath EXAM: CHEST - 2 VIEW COMPARISON:  06/22/2017 FINDINGS: Left pacer remains in place, unchanged. Cardiomegaly. Chronic changes on the right with similar appearance of pleural and parenchymal scarring. Small bilateral pleural effusions. IMPRESSION: Stable pleural/parenchymal scarring in the right lung. Cardiomegaly. Small bilateral effusions. Electronically Signed   By: Rolm Baptise M.D.   On: 06/24/2017 08:44   Dg Chest 2 View  Result Date: 06/20/2017 CLINICAL DATA:  Shortness of breath EXAM: CHEST - 2 VIEW COMPARISON:  03/02/2017 FINDINGS: Cardiac shadow is enlarged but stable. Pacing device is again seen and stable. Chronic changes are again noted in the right mid lung. No new focal infiltrate or effusion is seen. No bony abnormality is noted. IMPRESSION: Stable chronic changes.  No acute abnormality is  seen. Electronically Signed   By: Inez Catalina M.D.   On: 06/20/2017 17:13   Ct Head Wo Contrast  Result Date: 06/21/2017 CLINICAL DATA:  Syncope EXAM: CT HEAD WITHOUT CONTRAST TECHNIQUE: Contiguous axial images were obtained from the base of the skull through the vertex without intravenous contrast. COMPARISON:  None. FINDINGS: Brain: No evidence of acute infarction, hemorrhage, hydrocephalus, extra-axial collection or mass lesion/mass effect. Vascular: No hyperdense vessel or unexpected calcification. Minimal calcification at the carotid siphons. Skull: Normal. Negative for fracture or focal lesion. Sinuses/Orbits: No acute finding. Other: None IMPRESSION: Negative.  No CT evidence for acute intracranial abnormality. Electronically  Signed   By: Donavan Foil M.D.   On: 06/21/2017 00:58   Dg Chest Port 1 View  Result Date: 06/22/2017 CLINICAL DATA:  Respiratory distress EXAM: PORTABLE CHEST 1 VIEW COMPARISON:  06/20/2016, CT chest 03/02/2017 FINDINGS: Left-sided pacing device as before. Cardiomegaly with mild vascular congestion. Diffuse interstitial opacity, likely chronic scarring and fibrosis. Blunting of the right CP angle consistent with pleural scarring. Similar appearance of right peripheral pulmonary scarring. Probable skin fold artifact at the right apex. IMPRESSION: 1. Cardiomegaly with mild central congestion 2. Similar appearance of pleural and parenchymal scarring in the right thorax 3. Suspected skin fold artifacts at the lung apices. Electronically Signed   By: Donavan Foil M.D.   On: 06/22/2017 01:11      Subjective: - no chest pain, shortness of breath, no abdominal pain, nausea or vomiting.   Discharge Exam: Vitals:   06/24/17 1143 06/24/17 1149  BP: (!) 72/50 (!) 76/53  Pulse: 71   Resp: 20   Temp: 98.7 F (37.1 C)   SpO2: 93%     General: Pt is alert, awake, not in acute distress Cardiovascular: RRR, S1/S2 +, no rubs, no gallops Respiratory: CTA bilaterally, no  wheezing, no rhonchi Abdominal: Soft, NT, ND, bowel sounds + Extremities: no edema, no cyanosis    The results of significant diagnostics from this hospitalization (including imaging, microbiology, ancillary and laboratory) are listed below for reference.     Microbiology: Recent Results (from the past 240 hour(s))  MRSA PCR Screening     Status: None   Collection Time: 06/22/17  3:15 AM  Result Value Ref Range Status   MRSA by PCR NEGATIVE NEGATIVE Final    Comment:        The GeneXpert MRSA Assay (FDA approved for NASAL specimens only), is one component of a comprehensive MRSA colonization surveillance program. It is not intended to diagnose MRSA infection nor to guide or monitor treatment for MRSA infections. Performed at [facility], [place] [address], [place], [place] [address]      Labs: BNP (last 3 results) Recent Labs    10/01/16 1209 05/17/17 1425 06/20/17 1644  BNP 533.2* 772.8* 675.9*   Basic Metabolic Panel: Recent Labs  Lab 06/20/17 1644 06/21/17 0736 06/22/17 0230 06/24/17 0353  NA 138 139 139 141  K 4.7 4.1 4.7 3.1*  CL 105 110 109 109  CO2 18* 19* 21* 24  GLUCOSE 225* 190* 230* 86  BUN 31* 29* 30* 34*  CREATININE 1.53* 1.21* 1.49* 1.19*  CALCIUM 8.8* 8.0* 7.7* 7.7*  MG  --   --   --  2.2  PHOS  --   --   --  2.0*   Liver Function Tests: Recent Labs  Lab 06/20/17 1644 06/24/17 0353  AST 117* 20  ALT 71* 55*  ALKPHOS 99 49  BILITOT 0.9 0.6  PROT 6.4* 5.4*  ALBUMIN 3.9 2.9*   No results for input(s): LIPASE, AMYLASE in the last 168 hours. No results for input(s): AMMONIA in the last 168 hours. CBC: Recent Labs  Lab 06/20/17 1644 06/21/17 0736 06/22/17 0230 06/23/17 0302 06/24/17 0353  WBC 4.5 7.5 18.5* 8.3 6.6  HGB 13.4 11.9* 12.5 9.6* 10.1*  HCT 40.6 36.3 40.0 29.9* 30.5*  MCV 100.2* 100.0 104.4* 102.4* 101.0*  PLT 190 158 169 107* 114*   Cardiac Enzymes: Recent Labs  Lab 06/20/17 1644 06/20/17 2106  06/21/17 0143 06/21/17 0736 06/21/17 2058  TROPONINI 0.03* 0.06* 0.06* 0.04* 0.05*  BNP: Invalid input(s): POCBNP CBG: Recent Labs  Lab 06/23/17 1610 06/23/17 2113 06/24/17 0010 06/24/17 0430 06/24/17 0752  GLUCAP 113* 115* 100* 83 84   D-Dimer Recent Labs    06/22/17 0230  DDIMER 2.94*   Hgb A1c No results for input(s): HGBA1C in the last 72 hours. Lipid Profile No results for input(s): CHOL, HDL, LDLCALC, TRIG, CHOLHDL, LDLDIRECT in the last 72 hours. Thyroid function studies No results for input(s): TSH, T4TOTAL, T3FREE, THYROIDAB in the last 72 hours.  Invalid input(s): FREET3 Anemia work up No results for input(s): VITAMINB12, FOLATE, FERRITIN, TIBC, IRON, RETICCTPCT in the last 72 hours. Urinalysis    Component Value Date/Time   COLORURINE YELLOW 06/21/2017 0738   APPEARANCEUR CLEAR 06/21/2017 0738   LABSPEC 1.015 06/21/2017 0738   PHURINE 5.0 06/21/2017 0738   GLUCOSEU NEGATIVE 06/21/2017 0738   HGBUR NEGATIVE 06/21/2017 0738   BILIRUBINUR NEGATIVE 06/21/2017 0738   KETONESUR NEGATIVE 06/21/2017 0738   PROTEINUR NEGATIVE 06/21/2017 0738   UROBILINOGEN 0.2 04/10/2014 1128   NITRITE NEGATIVE 06/21/2017 0738   LEUKOCYTESUR TRACE (A) 06/21/2017 0738   Sepsis Labs Invalid input(s): PROCALCITONIN,  WBC,  LACTICIDVEN   Time coordinating discharge: 40 minutes  SIGNED:  Marzetta Board, MD  [facility] 06/24/2017, 3:36 PM Pager [phone]  If 7PM-7AM, please contact night-coverage [email or web address] Password [email or web address]

## 2017-06-24 NOTE — Progress Notes (Addendum)
Progress Note  Patient Name: Angela Shepherd Date of Encounter: 06/24/2017  Primary Cardiologist: Dr. Haroldine Laws  Primary Electrophysiologist:  Dr. Rayann Heman  Subjective   Feeling ok. No chest pain or dyspnea.   Inpatient Medications    Scheduled Meds: . apixaban  5 mg Oral BID  . arformoterol  15 mcg Nebulization BID  . aspirin  81 mg Oral Daily  . carvedilol  6.25 mg Oral BID WC  . chlorhexidine  15 mL Mouth Rinse BID  . FLUoxetine  20 mg Oral Daily  . ipratropium-albuterol  3 mL Nebulization TID  . loratadine  10 mg Oral Daily  . mouth rinse  15 mL Mouth Rinse q12n4p  . sodium chloride flush  3 mL Intravenous Q12H  . topiramate  50 mg Oral Daily  . torsemide  20 mg Oral BID   Continuous Infusions: . sodium chloride 10 mL/hr (06/23/17 1200)   PRN Meds: acetaminophen, albuterol, ALPRAZolam, amitriptyline, dextromethorphan-guaiFENesin, diclofenac sodium, fluticasone, nitroGLYCERIN, ondansetron (ZOFRAN) IV, SUMAtriptan   Vital Signs    Vitals:   06/23/17 2110 06/24/17 0432 06/24/17 0436 06/24/17 0814  BP: (!) 85/55 (!) 88/56    Pulse: 61 62    Resp:  18    Temp: 98.5 F (36.9 C)     TempSrc: Oral     SpO2: 95% 95%  94%  Weight:   130 lb 1.6 oz (59 kg)   Height:        Intake/Output Summary (Last 24 hours) at 06/24/2017 0819 Last data filed at 06/23/2017 1500 Gross per 24 hour  Intake 216 ml  Output 600 ml  Net -384 ml   Filed Weights   06/22/17 0300 06/23/17 0600 06/24/17 0436  Weight: 124 lb 5.4 oz (56.4 kg) 130 lb 8.2 oz (59.2 kg) 130 lb 1.6 oz (59 kg)    Telemetry    AV pacing   ECG    N/A  Physical Exam   GEN: Thin frail female in no acute distress.   Neck: No JVD Cardiac: RRR, no murmurs, rubs, or gallops.  Respiratory: Clear to auscultation bilaterally. GI: Soft, nontender, non-distended  MS: No edema; No deformity. Neuro:  Nonfocal  Psych: Normal affect   Labs    Chemistry Recent Labs  Lab 06/20/17 1644 06/21/17 0736  06/22/17 0230 06/24/17 0353  NA 138 139 139 141  K 4.7 4.1 4.7 3.1*  CL 105 110 109 109  CO2 18* 19* 21* 24  GLUCOSE 225* 190* 230* 86  BUN 31* 29* 30* 34*  CREATININE 1.53* 1.21* 1.49* 1.19*  CALCIUM 8.8* 8.0* 7.7* 7.7*  PROT 6.4*  --   --  5.4*  ALBUMIN 3.9  --   --  2.9*  AST 117*  --   --  20  ALT 71*  --   --  55*  ALKPHOS 99  --   --  49  BILITOT 0.9  --   --  0.6  GFRNONAA 35* 46* 36* 47*  GFRAA 40* 53* 41* 54*  ANIONGAP 15 10 9 8      Hematology Recent Labs  Lab 06/22/17 0230 06/23/17 0302 06/24/17 0353  WBC 18.5* 8.3 6.6  RBC 3.83* 2.92* 3.02*  HGB 12.5 9.6* 10.1*  HCT 40.0 29.9* 30.5*  MCV 104.4* 102.4* 101.0*  MCH 32.6 32.9 33.4  MCHC 31.3 32.1 33.1  RDW 14.6 14.7 14.5  PLT 169 107* 114*    Cardiac Enzymes Recent Labs  Lab 06/20/17 2106 06/21/17 0143 06/21/17 0736 06/21/17  2058  TROPONINI 0.06* 0.06* 0.04* 0.05*    BNP Recent Labs  Lab 06/20/17 1644  BNP 781.6*     DDimer  Recent Labs  Lab 06/22/17 0230  DDIMER 2.94*     Radiology    No results found.  Cardiac Studies   Echo 06/21/17 Study Conclusions  - Left ventricle: Wall thickness was increased in a pattern of mild   LVH. Systolic function was moderately reduced. The estimated   ejection fraction was in the range of 35% to 40%. Severe   hypokinesis of the mid-apicalanteroseptal, lateral,   inferolateral, and apical myocardium. - Ventricular septum: Septal motion showed abnormal function and   dyssynergy. - Aortic valve: There was mild regurgitation. - Mitral valve: There was mild to moderate regurgitation. - Left atrium: The atrium was moderately dilated. - Pulmonary arteries: Systolic pressure was moderately increased.   PA peak pressure: 41 mm Hg (S).  Patient Profile     Angela Shepherd is a 67 y.o. female with a hx of chronic combined systolic and diastolic heart failure (Last Echo 05/21/17 with LVEF of 35-40%), HOCM, PAF (on Eliquis), HTN, COPD, CKD III and  bradycardia s/p PPM who is admitted for syncope  in setting of over diuresis.  Assessment & Plan    1. Syncope: Thought to be due to overdiuresis in the setting of confusion about her torsemide dose (she was prescribed 20mg  BID however taking 40mg  BID).  Per patient, device interrogation showed atrial fibrillation at the time of her syncopal episode.  Interrogation not available at this time.  No ventricular arrhythmias.  Telemetry has been stable.   2. HOCM:  Carrier for HOCM based on genetics testing at [facility].  Wall thickness 1.1cm now and no evidence of obstruction or SAM on echo, though she did have septal wall thickness measured at 1.7cm in the past.  No ventricular arrhythmias.  Continue carvedilol.   3. Acute on chronic systolic and diastolic heart failure: Patient became volume overloaded and required bipap on 6/15 after holding diuretics for overdiuresis. She is doing better after IV lasix and resumed home torsemide 6/15.  - Continue carvedilol.  She has a [employer] BiV PPM.  Home losartan and spironolactone on hold 2/2 AKI and hypotension.  Can resume when renal function and BP tolerate.  Home carvedilol has been reduced.  Echo unchanged from prior.  4.  Bradycardia:  S/p BiV PPM.  5. Paroxsymal atrial fibrillation:  - Consider antiarrhythmic with EP as an outpatient.  Continue Eliquis and carvedilol.   6. Demand ischemia:  Troponin mildly elevated to 0.06 and flat.  7. Hypotension  - BP of 80-90s/50s. Denies dizziness with standing. Check orthostatic, if no issue>> continue Coreg at current dose of 6.25mg  BID. Continue to hold Losartan and Spironolactone until seen in CHF clinic (July 2nd at 9:30am).    8. Hypokalemia - Add daily Kdur 35meq while holding spironolactone.   Likely discharge home later today once seen by MD.   For questions or updates, please contact [place] Please consult [email or web address] for contact info under Cardiology/STEMI.       SignedLeanor Kail, PA  06/24/2017, 8:19 AM    I have personally seen and examined this patient. I agree with the assessment and plan as outlined above.  She was admitted with syncope and felt to be volume depleted due to taking double the prescribed dose of Torsemide. Her acute renal failure has resolved. She is feeling much better this am. Torsemide has been  restarted at 20 mg po BID. Coreg is at 6.25 mg po BID. Her ARB and aldactone are on hold for now. She can be discharged today but would plan close f/u with the CHF clinic as above.   Lauree Chandler 06/24/2017 10:04 AM

## 2017-06-24 NOTE — Progress Notes (Signed)
[place] discharged home. Awaiting daughter's arrival for transportation.  AVS went over with and given to patient. Signed copy in chart.  Patient will be taken taken to discharge lobby via wheelchair by staff.    Vitals:   06/24/17 1143 06/24/17 1149  BP: (!) 72/50 (!) 76/53  Pulse: 71   Resp: 20   Temp: 98.7 F (37.1 C)   SpO2: 93%      Julieanne Cotton, RN

## 2017-06-24 NOTE — Progress Notes (Signed)
Dr. Cruzita Lederer paged about low blood pressure. MD states this is her baseline, if she is asymptomatic then there is nothing that has to be done. Patient states she is not having any abnormal symptoms.   Blood pressure (!) 76/53, pulse 71, temperature 98.7 F (37.1 C), temperature source Oral, resp. rate 20, height 5\' 1"  (1.549 m), weight 59 kg (130 lb 1.6 oz), SpO2 93 %.

## 2017-06-24 NOTE — Discharge Instructions (Signed)

## 2017-06-25 ENCOUNTER — Other Ambulatory Visit: Payer: Self-pay | Admitting: Family Medicine

## 2017-06-25 DIAGNOSIS — Z1231 Encounter for screening mammogram for malignant neoplasm of breast: Secondary | ICD-10-CM

## 2017-06-26 ENCOUNTER — Other Ambulatory Visit: Payer: 59

## 2017-06-28 ENCOUNTER — Other Ambulatory Visit: Payer: Self-pay | Admitting: *Deleted

## 2017-06-28 NOTE — Patient Outreach (Signed)
[facility]) Care Management  06/28/2017  Angela Shepherd 10-08-1950 [id number]  Subjective: Telephone call to patient's home / mobile number, spoke with patient, and HIPAA verified.  Discussed [facility] Care Management [facility] Transition of care follow up, patient voiced understanding, and is in agreement to follow up.   Patient remembers speaking with this RNCM in the past, states doing a little better today, is very tired from work today, went back to work on 06/25/17, not up to talking right now, and requested call back.      Objective:Per KPN (Knowledge Performance Now, point of care tool) and chart review, patient hospitalized 06/20/17 - 06/24/17 for syncope.   [facility] [place] [place]. Patient hospitalized 01/15/17 -01/18/17 for shortness of breath. Patient hospitalized 08/22/16 -08/23/16 for Shortness of breath, Lactic acidosis, Hypercapnia, and Congestive heart failure, unspecified HF chronicity, unspecified heart failure type. Hospitalized at [facility], [place]. Patient also has a history of asthma, COPD, HOCM (hypertrophic obstructive cardiomyopathy), cardiac pacemaker,Paroxysmal atrial fibrillation,and acute on chronic systolic congestive heart failure.[facility] Care Management completed transition of care follow up on 03/28/17.       Assessment: Received [facility]MR Transition of care referral on6/20/19. Transition of care follow up pending patient contact.     Plan:RNCM will send unsuccessful outreach  letter, [facility] pamphlet, will call patient for 2nd telephone outreach attempt, transition of care follow up, and proceed with case closure, within 10 business days if no return call.        Meira Wahba H. Annia Friendly, BSN, [place] [facility] Telephonic CM Phone: [phone] Fax: [phone]

## 2017-07-01 ENCOUNTER — Other Ambulatory Visit: Payer: Self-pay | Admitting: *Deleted

## 2017-07-01 MED FILL — CARVEDILOL 12.5 MG TABLET: 12.5 | 30 days supply | Qty: 60 | Fill #2

## 2017-07-01 NOTE — Patient Outreach (Signed)
[facility]) Care Management  07/01/2017  Angela Shepherd 1950/10/16 [id number]   Subjective: Telephone call to patient's home  number, no answer, left HIPAA compliant voicemail message, and requested call back.    Objective:Per KPN (Knowledge Performance Now, point of care tool) and chart review, patient hospitalized 06/20/17 - 06/24/17 for syncope.   [facility]17 [facility] [place] [place]. Patient hospitalized 01/15/17 -01/18/17 for shortness of breath. Patient hospitalized 08/22/16 -08/23/16 for Shortness of breath, Lactic acidosis, Hypercapnia, and Congestive heart failure, unspecified HF chronicity, unspecified heart failure type. Hospitalized at [facility], [place]. Patient also has a history of asthma, COPD, HOCM (hypertrophic obstructive cardiomyopathy), cardiac pacemaker,Paroxysmal atrial fibrillation,and acute on chronic systolic congestive heart failure.[facility] Care Management completed transition of care follow up on 03/28/17.      Assessment: Received UMR Transition of care referral on6/20/19. Transition of care follow up pending patient contact.      Plan:RNCM has sent unsuccessful outreach  letter, [facility] pamphlet, will call patient for 3rd telephone outreach attempt, transition of care follow up, and proceed with case closure, within 10 business days if no return call.       Shakil Dirk H. Annia Friendly, BSN, [place] [facility] Telephonic CM Phone: [phone] Fax: [phone]

## 2017-07-02 ENCOUNTER — Other Ambulatory Visit: Payer: Self-pay | Admitting: *Deleted

## 2017-07-02 ENCOUNTER — Other Ambulatory Visit (INDEPENDENT_AMBULATORY_CARE_PROVIDER_SITE_OTHER): Payer: 59

## 2017-07-02 ENCOUNTER — Ambulatory Visit (HOSPITAL_BASED_OUTPATIENT_CLINIC_OR_DEPARTMENT_OTHER)
Admission: RE | Admit: 2017-07-02 | Discharge: 2017-07-02 | Disposition: A | Discharge status: Home / Self Care | Payer: 59 | Source: Ambulatory Visit | Attending: Family Medicine | Admitting: Family Medicine

## 2017-07-02 ENCOUNTER — Other Ambulatory Visit (HOSPITAL_COMMUNITY): Payer: Self-pay | Admitting: Internal Medicine

## 2017-07-02 DIAGNOSIS — Z1231 Encounter for screening mammogram for malignant neoplasm of breast: Secondary | ICD-10-CM | POA: Insufficient documentation

## 2017-07-02 DIAGNOSIS — R7989 Other specified abnormal findings of blood chemistry: Secondary | ICD-10-CM | POA: Diagnosis not present

## 2017-07-02 MED FILL — POTASSIUM CL ER 20 MEQ TAB: 20 | 90 days supply | Qty: 90 | Fill #2

## 2017-07-02 MED FILL — TORSEMIDE 10 MG TABLET: 10 | 30 days supply | Qty: 120 | Fill #3

## 2017-07-02 MED FILL — ELIQUIS 5 MG TABLET: 5 | 30 days supply | Qty: 60 | Fill #0

## 2017-07-02 MED FILL — FLUoxetine HCL 20 MG CAPS: 20 | 30 days supply | Qty: 30 | Fill #3

## 2017-07-02 NOTE — Patient Outreach (Signed)
[place] [place] [facility]) Care Management  07/02/2017  Angela Shepherd 01/27/1950 [id number]   Subjective: Telephone call to patient's home  number, no answer, left HIPAA compliant voicemail message, and requested call back.    Objective:Per KPN (Knowledge Performance Now, point of care tool) and chart review,patient hospitalized 06/20/17 - 06/24/17 for syncope. [facility] 03/02/17 [facility]03[facility]03/[facility] [place] [place] Patient hospitalized 01/15/17 -01/18/17 for shortness of breath. Patient hospitalized 08/22/16 -08/23/16 for Shortness of breath, Lactic acidosis, Hypercapnia, and Congestive heart failure, unspecified HF chronicity, unspecified heart failure type. Hospitalized at [facility], [place]. Patient also has a history of asthma, COPD, HOCM (hypertrophic obstructive cardiomyopathy), cardiac pacemaker,Paroxysmal atrial fibrillation,and acute on chronic systolic congestive heart failure.[name] Care Management completed transition of care follow up on 03/28/17.      Assessment: Received UMR Transition of care referral on6/20/19. Transition of care follow up pending patient contact.     Plan:RNCM has sent unsuccessful outreach letter, [facility] pamphlet, and will proceed with case closure, within 10 business days if no return call.       Angela Shepherd H. Annia Friendly, BSN, [place] [facility] Telephonic CM Phone: [phone] Fax: [phone]

## 2017-07-03 ENCOUNTER — Encounter: Payer: Self-pay | Admitting: Emergency Medicine

## 2017-07-03 LAB — BASIC METABOLIC PANEL
BUN: 19 mg/dL (ref 6–23)
CHLORIDE: 104 meq/L (ref 96–112)
CO2: 26 meq/L (ref 19–32)
Calcium: 9.7 mg/dL (ref 8.4–10.5)
Creatinine, Ser: 1.18 mg/dL (ref 0.40–1.20)
GFR: 48.75 mL/min — ABNORMAL LOW (ref >60.00)
Glucose, Bld: 83 mg/dL (ref 70–99)
POTASSIUM: 4.6 meq/L (ref 3.5–5.1)
SODIUM: 141 meq/L (ref 135–145)

## 2017-07-04 ENCOUNTER — Ambulatory Visit (INDEPENDENT_AMBULATORY_CARE_PROVIDER_SITE_OTHER): Payer: 59 | Admitting: Acute Care

## 2017-07-04 ENCOUNTER — Other Ambulatory Visit (INDEPENDENT_AMBULATORY_CARE_PROVIDER_SITE_OTHER): Payer: 59

## 2017-07-04 ENCOUNTER — Other Ambulatory Visit: Payer: Self-pay | Admitting: *Deleted

## 2017-07-04 ENCOUNTER — Encounter: Payer: Self-pay | Admitting: Acute Care

## 2017-07-04 VITALS — BP 122/72 | HR 65 | Ht 61.0 in | Wt 124.6 lb

## 2017-07-04 DIAGNOSIS — J309 Allergic rhinitis, unspecified: Secondary | ICD-10-CM

## 2017-07-04 DIAGNOSIS — J810 Acute pulmonary edema: Secondary | ICD-10-CM

## 2017-07-04 DIAGNOSIS — J449 Chronic obstructive pulmonary disease, unspecified: Secondary | ICD-10-CM | POA: Diagnosis not present

## 2017-07-04 DIAGNOSIS — J811 Chronic pulmonary edema: Secondary | ICD-10-CM | POA: Insufficient documentation

## 2017-07-04 LAB — CBC WITH DIFFERENTIAL/PLATELET
BASOS ABS: 0.1 10*3/uL (ref 0.0–0.1)
Basophils Relative: 1.1 % (ref 0.0–3.0)
Eosinophils Absolute: 0.2 10*3/uL (ref 0.0–0.7)
Eosinophils Relative: 2.3 % (ref 0.0–5.0)
HCT: 36.3 % (ref 36.0–46.0)
Hemoglobin: 12.6 g/dL (ref 12.0–15.0)
LYMPHS ABS: 1.4 10*3/uL (ref 0.7–4.0)
Lymphocytes Relative: 21.5 % (ref 12.0–46.0)
MCHC: 34.8 g/dL (ref 30.0–36.0)
MCV: 96.8 fl (ref 78.0–100.0)
MONO ABS: 0.5 10*3/uL (ref 0.1–1.0)
MONOS PCT: 7.7 % (ref 3.0–12.0)
NEUTROS ABS: 4.4 10*3/uL (ref 1.4–7.7)
NEUTROS PCT: 67.4 % (ref 43.0–77.0)
PLATELETS: 316 10*3/uL (ref 150.0–400.0)
RBC: 3.75 Mil/uL — ABNORMAL LOW (ref 3.87–5.11)
RDW: 14.6 % (ref 11.5–15.5)
WBC: 6.5 10*3/uL (ref 4.0–10.5)

## 2017-07-04 MED ORDER — MONTELUKAST SODIUM 10 MG PO TABS: 10.0000 mg | ORAL_TABLET | Freq: Every day | ORAL | 5 refills | Status: DC

## 2017-07-04 MED FILL — MONTELUKAST SOD 10 MG TAB: 10 | 30 days supply | Qty: 30 | Fill #0

## 2017-07-04 NOTE — Patient Instructions (Addendum)
It is nice to meet you today. We will schedule PFT's Continue Symbicort 2 puffs twice daily  Rinse mouth after use Continue Duo Nebs as needed for breakthrough shortness of breath or wheezing Use rescue inhaler as needed for breakthrough shortness of breath or wheezing. Continue Eliquis as prescribed. Continue Flonase 2 puffs each day in each nostril Singulair 10 mg once daily We will send in a prescription for this. We will call you with results CBC with diff today.( Check Eosinophils) Follow up with Dr. Lamonte Sakai in August if available slot. If not, follow up with Judson Roch NP. Please contact office for sooner follow up if symptoms do not improve or worsen or seek emergency care  May need methacholine challenge test. Blood Allergy profile only if covered by insurance.

## 2017-07-04 NOTE — Assessment & Plan Note (Addendum)
Frequent hospitalizations since 08/2016 Pt. States she thinks she has asthma, but states she does not wheeze with exacerbations Last PFT's done prior to the increase in admissions Plan: We will schedule PFT's Continue Symbicort 2 puffs twice daily  Rinse mouth after use Continue Duo Nebs as needed for breakthrough shortness of breath or wheezing Use rescue inhaler as needed for breakthrough shortness of breath or wheezing. Continue Eliquis as prescribed. Continue Flonase 2 puffs each day in each nostril Singulair 10 mg once daily We will send in a prescription for this. We will call you with results CBC with diff today.( Check Eosinophils) Follow up with Dr. Lamonte Sakai in August if available slot. If not, follow up with Judson Roch NP. Please contact office for sooner follow up if symptoms do not improve or worsen or seek emergency care  May need methacholine challenge test if PFT's do not clarify frequent exacerbations. Blood Allergy profile only if covered by insurance.

## 2017-07-04 NOTE — Progress Notes (Addendum)
History of Present Illness Angela Shepherd is a 67 y.o. female with COPD [FEV1 12%], Systolic HF (EF 87-86 with mild LVH, and moderately reduced systolic function), Hypotension, A.Fib on Eliquis, CKD stage 3 . She was seen in the hospital as a consult  by Dr. Vaughan Browner. She is followed by Dr. Lamonte Sakai in the office.  HPI Presented  to ED on 6/13 dyspnea, nausea, and syncope. She was at the drug store when she passed out. She  complained of mild Chest Pain. Upon arrival to ED was noted to have wheezing, given bronchodilator. BP 71/57 which improved after 500 cc bolus normal saline. BNP 781. WBC 4.5. Troponin 0.03. Admitted to telemetry. Cardiology consulted. Believed syncope secondary to hypotension due to over diuresis.   On 6/15 patient decompensated. Noted to be hypoxic with chest pain. CXR with worsening vascular congestion. ABG 7.09/60.9/60.3. Given 40 meq lasix and placed on BiPAP. ABG 7.08/73.5/80.4. Patient transferred to ICU for further management.     07/04/2017  Hospital Follow up Pt. Had a hospitalization 06/20/2017-06/24/2017. Hospital Course as noted below  Hospital Course: Syncope -patient was admitted to the hospital after having an episode of weakness, patient denies any syncope and stated that she has felt lightheaded and guided herself to the floor.  She reported  that she had been taking Lasix and not being able to drink as much, and felt like she was dehydrated.  She reported a 5 pound weight loss with dehydration.  When she was admitted to the hospital she was given IV fluids for hypotension which placed her into respiratory failure requiring BiPAP placement and transferred to the ICU.  Cardiology was consulted and followed patient while hospitalized.  She was started back on her diuretics with improvement in her respiratory status. Hypotension -patient reports chronic hypotension at baseline, her blood pressure at home ranges between 76-72 systolic, she is asymptomatic, and in fact  tells me that she feels a lot better when the blood pressure is low rather than elevated.  Currently blood pressure is in the 09O 70J systolic today, she has no lightheadedness, no dizziness, able to ambulate without difficulties.  Cardiology evaluated patient and she appears to be stable for discharge.  She will be sent home in stable condition with outpatient follow-up, with discontinuation of her ARB as well as spironolactone until next follow-up.  Her Coreg has also been decreased. Acute hypoxic respiratory failure -due to pulmonary edema in the setting of IV fluids, now resolved with diuresis and she is back on room air Acute on chronic combined systolic and diastolic CHF -most recent 2D echo was done on 06/21/2017 showed an EF of 35 to 40%.  She initially was felt to be dehydrated however with initial IV fluids she developed pulmonary edema.  She appears currently euvolemic, she is able to tolerate her torsemide, her spironolactone has been discontinued by cardiology and will be held on discharge up until she follows up with a heart failure clinic.  Her ARB has also been placed on hold she follows up. Chronic kidney disease stage III -baseline creatinine between 1.1.3, her creatinine was mildly elevated 1.5 on admission, on discharge has improved to baseline at 1.19.  Her ARB is on hold as above. A. Fib -currently pacemaker in place, continue Coreg, torsemide, anticoagulation with Eliquis Microcytic anemia -hemoglobin stable Elevated LFTs - ? related to decompensated CHF, improved COPD - stable, no wheezing, resume home medications  Follow Up OV: Pt. Presents to the office today. She states she  has been doing well. She feels she is at her baseline. She is very focused on her recent hospitalizations.She has been admitted to the hospital 7 times since August 2018 for the same type of symptoms (She states symptoms are  No coughing , no wheezing, + rapid HR, can't talk or phonate.) Of note, there is  documentation of wheezing per her most recent admission note.She is very focused on her frequent exacerbations .  She is using her Symbicort as prescribed.  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 She thinks she has asthma. She states these exacerbations are not responsive to her rescue inhaler. She states these always occur with change in temperature going from indoor to outdoor, or outdoor to indoor . She appears anxious today.  She states she does feel like she has post nasal drip. She states she is compliant with her Flonase. She Denies nasal stuffiness.She denies fever, chest pain ,orthopnea or hemoptysis.  Test Results: CXR 06/24/2017 Stable pleural/parenchymal scarring in the right lung. Cardiomegaly. Small bilateral effusions.  CBC    Component Value Date/Time   WBC 6.5 07/04/2017 1008   RBC 3.75 (L) 07/04/2017 1008   HGB 12.6 07/04/2017 1008   HCT 36.3 07/04/2017 1008   PLT 316.0 07/04/2017 1008   MCV 96.8 07/04/2017 1008   MCH 33.4 06/24/2017 0353   MCHC 34.8 07/04/2017 1008   RDW 14.6 07/04/2017 1008   LYMPHSABS 1.4 07/04/2017 1008   MONOABS 0.5 07/04/2017 1008   EOSABS 0.2 07/04/2017 1008   BASOSABS 0.1 07/04/2017 1008     06/2017>> 2D echo  Study Conclusions - Left ventricle: Wall thickness was increased in a pattern of mildLVH. Systolic function was moderately reduced. The estimatedejection fraction was in the range of 35% to 40%. Severehypokinesis of the mid-apicalanteroseptal, lateral,inferolateral, and apical myocardium. - Ventricular septum: Septal motion showed abnormal function anddyssynergy. - Aortic valve: There was mild regurgitation. - Mitral valve: There was mild to moderate regurgitation. - Left atrium: The atrium was moderately dilated. - Pulmonary arteries: Systolic pressure was moderately increased.PA peak pressure: 41 mm Hg  (S).  PFT's 06/2016 >>severe obstruction without a bronchodilator response. Lung volume are normal suggestive of possible coexisting restriction. Decreased diffusion capacity that corrects to the normal range when adjusted for alveolar volume.    CBC Latest Ref Rng & Units 07/04/2017 06/24/2017 06/23/2017  WBC 4.0 - 10.5 K/uL 6.5 6.6 8.3  Hemoglobin 12.0 - 15.0 g/dL 12.6 10.1(L) 9.6(L)  Hematocrit 36.0 - 46.0 % 36.3 30.5(L) 29.9(L)  Platelets 150.0 - 400.0 K/uL 316.0 114(L) 107(L)    BMP Latest Ref Rng & Units 07/02/2017 06/24/2017 06/22/2017  Glucose 70 - 99 mg/dL 83 86 230(H)  BUN 6 - 23 mg/dL 19 34(H) 30(H)  Creatinine 0.40 - 1.20 mg/dL 1.18 1.19(H) 1.49(H)  Sodium 135 - 145 mEq/L 141 141 139  Potassium 3.5 - 5.1 mEq/L 4.6 3.1(L) 4.7  Chloride 96 - 112 mEq/L 104 109 109  CO2 19 - 32 mEq/L 26 24 21(L)  Calcium 8.4 - 10.5 mg/dL 9.7 7.7(L) 7.7(L)    BNP    Component Value Date/Time   BNP 781.6 (H) 06/20/2017 1644   BNP 161.0 (H) 01/20/2015 1647    ProBNP    Component Value Date/Time   PROBNP 958.2 (H) 09/03/2013 1544    PFT    Component Value Date/Time   FEV1PRE 1.19 07/04/2016 1128  FEV1POST 1.28 07/04/2016 1128   FVCPRE 2.04 07/04/2016 1128   FVCPOST 2.00 07/04/2016 1128   TLC 4.22 07/04/2016 1128   DLCOUNC 12.85 07/04/2016 1128   PREFEV1FVCRT 59 07/04/2016 1128   PSTFEV1FVCRT 64 07/04/2016 1128    Dg Chest 2 View  Result Date: 06/24/2017 CLINICAL DATA:  Chest pain, shortness of breath EXAM: CHEST - 2 VIEW COMPARISON:  06/22/2017 FINDINGS: Left pacer remains in place, unchanged. Cardiomegaly. Chronic changes on the right with similar appearance of pleural and parenchymal scarring. Small bilateral pleural effusions. IMPRESSION: Stable pleural/parenchymal scarring in the right lung. Cardiomegaly. Small bilateral effusions. Electronically Signed   By: Rolm Baptise M.D.   On: 06/24/2017 08:44   Dg Chest 2 View  Result Date: 06/20/2017 CLINICAL DATA:  Shortness of breath  EXAM: CHEST - 2 VIEW COMPARISON:  03/02/2017 FINDINGS: Cardiac shadow is enlarged but stable. Pacing device is again seen and stable. Chronic changes are again noted in the right mid lung. No new focal infiltrate or effusion is seen. No bony abnormality is noted. IMPRESSION: Stable chronic changes.  No acute abnormality is seen. Electronically Signed   By: Inez Catalina M.D.   On: 06/20/2017 17:13   Ct Head Wo Contrast  Result Date: 06/21/2017 CLINICAL DATA:  Syncope EXAM: CT HEAD WITHOUT CONTRAST TECHNIQUE: Contiguous axial images were obtained from the base of the skull through the vertex without intravenous contrast. COMPARISON:  None. FINDINGS: Brain: No evidence of acute infarction, hemorrhage, hydrocephalus, extra-axial collection or mass lesion/mass effect. Vascular: No hyperdense vessel or unexpected calcification. Minimal calcification at the carotid siphons. Skull: Normal. Negative for fracture or focal lesion. Sinuses/Orbits: No acute finding. Other: None IMPRESSION: Negative.  No CT evidence for acute intracranial abnormality. Electronically Signed   By: Donavan Foil M.D.   On: 06/21/2017 00:58   Dg Chest Port 1 View  Result Date: 06/22/2017 CLINICAL DATA:  Respiratory distress EXAM: PORTABLE CHEST 1 VIEW COMPARISON:  06/20/2016, CT chest 03/02/2017 FINDINGS: Left-sided pacing device as before. Cardiomegaly with mild vascular congestion. Diffuse interstitial opacity, likely chronic scarring and fibrosis. Blunting of the right CP angle consistent with pleural scarring. Similar appearance of right peripheral pulmonary scarring. Probable skin fold artifact at the right apex. IMPRESSION: 1. Cardiomegaly with mild central congestion 2. Similar appearance of pleural and parenchymal scarring in the right thorax 3. Suspected skin fold artifacts at the lung apices. Electronically Signed   By: Donavan Foil M.D.   On: 06/22/2017 01:11   Mm 3d Screen Breast Bilateral  Result Date: 07/03/2017 CLINICAL  DATA:  Screening. EXAM: DIGITAL SCREENING BILATERAL MAMMOGRAM WITH TOMO AND CAD COMPARISON:  Previous exam(s). ACR Breast Density Category c: The breast tissue is heterogeneously dense, which may obscure small masses. FINDINGS: There are no findings suspicious for malignancy. Images were processed with CAD. IMPRESSION: No mammographic evidence of malignancy. A result letter of this screening mammogram will be mailed directly to the patient. RECOMMENDATION: Screening mammogram in one year. (Code:SM-B-01Y) BI-RADS CATEGORY  1: Negative. Electronically Signed   By: Trude Mcburney M.D.   On: 07/03/2017 10:40     Past medical hx Past Medical History:  Diagnosis Date  . Bradycardia    a. initial PPM placed 1994 at [facility] with gen change 2002. b. 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 Jude BiV PPM 05/08/12.  . Chronic systolic CHF (congestive heart failure) ([place])    a. EF 30-35% dx in 2011 -> most recent 40-45% by echo 04/2012. b.  s/p upgrade to BiV-PPM 05/08/12. c. Med titration limited by hypotension.  . Cluster headache syndrome   . Hypertr obst cardiomyop   . Hypokalemia   . Hypotension   . Paroxysmal atrial fibrillation (H[facility])    a. identified on PPM interrogation b. longest episode 1 hour; if burden increases, will need [place]   . Pneumonia 2008     Social History   Tobacco Use  . Smoking status: Former Smoker    Packs/day: 3.00  . Smokeless tobacco: Never Used  Substance Use Topics  . Alcohol use: Yes    Alcohol/week: 1.2 oz    Types: 2 Glasses of wine per week  . Drug use: No    Ms.Goins reports that she has quit smoking. She smoked 3.00 packs per day. She has never used smokeless tobacco. She reports that she drinks about 1.2 oz of alcohol per week. She reports that she does not use drugs.  Tobacco Cessation: Former smoker quit 2012  Past surgical hx, Family hx, Social hx all reviewed.  Current Outpatient Medications on File Prior to Visit  Medication Sig   . albuterol (PROVENTIL HFA;VENTOLIN HFA) 108 (90 Base) MCG/ACT inhaler Inhale 1-2 puffs into the lungs every 6 (six) hours as needed for wheezing or shortness of breath.  . ALPRAZolam (XANAX) 0.25 MG tablet Take 1 tablet (0.25 mg total) by mouth 2 (two) times daily as needed for anxiety.  . AMBULATORY NON FORMULARY MEDICATION Medication Name: 100% O2 15 leters a min for 15-20 mins  Via Nasal cannual (Patient taking differently: Medication Name: 100% O2 15 leters a min for 15-20 mins as needed  Via Nasal cannual)  . amitriptyline (ELAVIL) 10 MG tablet Take 10 mg by mouth as needed (headaches).   Marland Kitchen aspirin 81 MG chewable tablet Chew 81 mg by mouth daily.  . carvedilol (COREG) 6.25 MG tablet Take 1 tablet (6.25 mg total) by mouth 2 (two) times daily with a meal.  . cyclobenzaprine (FLEXERIL) 5 MG tablet TAKE 1 TABLET (5 MG TOTAL) BY MOUTH 3 (THREE) TIMES DAILY AS NEEDED FOR MUSCLE SPASMS.  Marland Kitchen diclofenac sodium (VOLTAREN) 1 % GEL Apply 2 g topically 4 (four) times daily. (Patient taking differently: Apply 2 g topically 4 (four) times daily as needed (pain). )  . ELIQUIS 5 MG TABS tablet TAKE 1 TABLET BY MOUTH TWICE DAILY  . FLUoxetine (PROZAC) 20 MG capsule Take 1 capsule (20 mg total) by mouth daily.  . fluticasone (FLONASE) 50 MCG/ACT nasal spray PLACE 2 SPRAYS INTO BOTH NOSTRILS DAILY. (Patient taking differently: Place 2 sprays into both nostrils daily as needed for allergies. )  . ipratropium-albuterol (DUONEB) 0.5-2.5 (3) MG/3ML SOLN USE 1 VIAL (3ML) BY NEBULIZER EVERY 4 TO 6 HOURS AS NEEDED (Patient taking differently: Take 3 mLs by nebulization every 4 (four) hours as needed (shortness of breath). )  . loratadine (CLARITIN) 10 MG tablet Take 1 tablet (10 mg total) by mouth daily.  . nicotine (NICODERM CQ - DOSED IN MG/24 HOURS) 14 mg/24hr patch Place 1 patch (14 mg total) onto the skin daily. (Patient taking differently: Place 14 mg onto the skin daily as needed (nicotine withdrawal). )  .  potassium chloride SA (K-DUR,KLOR-CON) 20 MEQ tablet Take 1 tablet (20 mEq total) by mouth daily.  Marland Kitchen Spacer/Aero-Holding Chambers (AEROCHAMBER MV) inhaler Use as instructed with Symbicort  . SUMAtriptan (IMITREX) 50 MG tablet TAKE 1 TABLET AT ONSET OF HEADACHE. MAY REPEAT IN 2 HOURS IF HEADACHE PERSISTS OR RECURS. (Patient taking differently:  TAKE 1 TABLET (50MG ) AS NEEDED AT ONSET OF HEADACHE. MAY REPEAT IN 2 HOURS IF HEADACHE PERSISTS OR RECURS.)  . SYMBICORT 160-4.5 MCG/ACT inhaler INHALE 2 PUFFS BY MOUTH INTO THE LUNGS 2 TIMES A DAY  . topiramate (TOPAMAX) 50 MG tablet Take 50 mg by mouth daily.  Marland Kitchen torsemide (DEMADEX) 20 MG tablet Take 1 tablet (20 mg total) by mouth 2 (two) times daily. (Patient taking differently: Take 40 mg by mouth 2 (two) times daily. )  . traMADol (ULTRAM) 50 MG tablet Take 0.5 tablets (25 mg total) by mouth every 12 (twelve) hours as needed.   No current facility-administered medications on file prior to visit.      Allergies  Allergen Reactions  . Prednisone Other (See Comments)    SOB, CHF     Review Of Systems:  Constitutional:   No  weight loss, night sweats,  Fevers, chills, fatigue, or  lassitude.  HEENT:   No headaches,  Difficulty swallowing,  Tooth/dental problems, or  Sore throat,                No sneezing, itching, ear ache, nasal congestion,+ post nasal drip,   CV:  No chest pain,  Orthopnea, PND, +swelling in lower extremities, No anasarca, dizziness, palpitations, syncope.   GI  No heartburn, indigestion, abdominal pain, nausea, vomiting, diarrhea, change in bowel habits, loss of appetite, bloody stools.   Resp: + shortness of breath with exertion  None at rest.  No excess mucus, no productive cough,  No non-productive cough,  No coughing up of blood.  No change in color of mucus.  No wheezing.  No chest wall deformity  Skin: no rash or lesions.  GU: no dysuria, change in color of urine, no urgency or frequency.  No flank pain, no hematuria    MS:  No joint pain or swelling.  No decreased range of motion.  No back pain.  Psych:  No change in mood or affect. No depression or anxiety.  No memory loss.   Vital Signs BP 122/72 (BP Location: Left Arm, Cuff Size: Normal)   Pulse 65   Ht 5\' 1"  (1.549 m)   Wt 124 lb 9.6 oz (56.5 kg)   LMP  (LMP Unknown)   SpO2 96%   BMI 23.54 kg/m    Physical Exam:  General- No distress,  A&Ox3, pleasant and anxious ENT: No sinus tenderness, TM clear, pale nasal mucosa, no oral exudate,+ post nasal drip, no LAN Cardiac: S1, S2, regular rate and rhythm, no murmur Chest: No wheeze/ rales/ dullness; no accessory muscle use, no nasal flaring, no sternal retractions Abd.: Soft Non-tender, ND BS +, Body mass index is 23.54 kg/m. Ext: No clubbing cyanosis, 1+ BLE edema Neuro:  normal strength, MAE X 4, A&O x 3 Skin: No rashes, warm and dry Psych: Anxious   Assessment/Plan  Chronic allergic rhinitis Continue Flonase 2 puffs each day in each nostril Singulair 10 mg once daily We will send in a prescription for this. CBC with diff today.( Check Eosinophils) Follow up with Dr. Lamonte Sakai in August if available slot. If not, follow up with Judson Roch NP. Please contact office for sooner follow up if symptoms do not improve or worsen or seek emergency care  May need methacholine challenge test. Blood Allergy profile only if covered by insurance.    COPD (chronic obstructive pulmonary disease) ([place]) Frequent hospitalizations since 08/2016 Pt. States she thinks she has asthma, but states she does not wheeze with exacerbations Last  PFT's done prior to the increase in admissions Plan: We will schedule PFT's Continue Symbicort 2 puffs twice daily  Rinse mouth after use Continue Duo Nebs as needed for breakthrough shortness of breath or wheezing Use rescue inhaler as needed for breakthrough shortness of breath or wheezing. Continue Eliquis as prescribed. Continue Flonase 2 puffs each day in each  nostril Singulair 10 mg once daily We will send in a prescription for this. We will call you with results CBC with diff today.( Check Eosinophils) Follow up with Dr. Lamonte Sakai in August if available slot. If not, follow up with Judson Roch NP. Please contact office for sooner follow up if symptoms do not improve or worsen or seek emergency care  May need methacholine challenge test if PFT's do not clarify frequent exacerbations. Blood Allergy profile only if covered by insurance.    Pulmonary edema Resolved after recent admission and discharge ? Flash Pulmonary Edema vs.asthmatic component/ trigger 7 admissions since 08/2016 Last PFT's prior to this date Plan: Repeat PFT and assess for change since last evaluated Diuretic per heart failure clinic  May need methacholine challenge test to rule out asthma      Magdalen Spatz, NP 07/04/2017  6:38 PM

## 2017-07-04 NOTE — Assessment & Plan Note (Addendum)
Resolved after recent admission and discharge ? Flash Pulmonary Edema vs.asthmatic component/ trigger 7 admissions since 08/2016 Last PFT's prior to this date Plan: Repeat PFT and assess for change since last evaluated Diuretic per heart failure clinic  May need methacholine challenge test to rule out asthma

## 2017-07-04 NOTE — Assessment & Plan Note (Signed)
Continue Flonase 2 puffs each day in each nostril Singulair 10 mg once daily We will send in a prescription for this. CBC with diff today.( Check Eosinophils) Follow up with Dr. Lamonte Sakai in August if available slot. If not, follow up with Judson Roch NP. Please contact office for sooner follow up if symptoms do not improve or worsen or seek emergency care  May need methacholine challenge test. Blood Allergy profile only if covered by insurance.

## 2017-07-04 NOTE — Patient Outreach (Signed)
[facility]) Care Management  07/04/2017  Angela Shepherd January 30, 1950 [id number]   Subjective: Received voicemail message from patient states she is returning call,  wanted to know if there were any community resources through [facility] for obtaining blood pressure cuff because her current cuff is not working properly, and requested call back.  Telephone call to patient's home  / mobile number, no answer, left HIPAA compliant voicemail message, and requested call back.    Objective:Per KPN (Knowledge Performance Now, point of care tool) and chart review,patient hospitalized 06/20/17 - 06/24/17 for syncope. [facility]17 [facility] [place] [place] Patient hospitalized 01/15/17 -01/18/17 for shortness of breath. Patient hospitalized 08/22/16 -08/23/16 for Shortness of breath, Lactic acidosis, Hypercapnia, and Congestive heart failure, unspecified HF chronicity, unspecified heart failure type. Hospitalized at [facility], [place]. Patient also has a history of asthma, COPD, HOCM (hypertrophic obstructive cardiomyopathy), cardiac pacemaker,Paroxysmal atrial fibrillation,and acute on chronic systolic congestive heart failure.[facility] Care Management completed transition of care follow up on 03/28/17.    Assessment: Received UMR Transition of care referral on6/20/19. Transition of care follow up pending patient contact.     Plan:RNCM hassentunsuccessful outreach letter, [facility] pamphlet, and will proceed with case closure, within 10 business days if no return call.       Bellanie Matthew H. Annia Friendly, BSN, [place] Management [facility] Telephonic CM Phone: [phone] Fax: [phone]

## 2017-07-05 ENCOUNTER — Telehealth: Payer: Self-pay | Admitting: Acute Care

## 2017-07-05 NOTE — Telephone Encounter (Signed)
Called and spoke with patient regarding her request of having a nebulizer for her car. Called and spoke with Vicente Males with [facility] at [phone] regarding this request. Vicente Males spoke with her supervisor, at this time [facility] does not offer this device. Called pt back to advise her of this information from [facility]. Pt verbalized understanding and was thankful for the help. Nothing further needed at this time.

## 2017-07-09 ENCOUNTER — Encounter (HOSPITAL_COMMUNITY): Payer: 59

## 2017-07-09 MED FILL — ALPRAZolam 0.25 MG TABS: 0.25 | 15 days supply | Qty: 30 | Fill #1

## 2017-07-09 MED FILL — FLUTICASONE PROP 50 MCG SPR: 50 | 30 days supply | Qty: 16 | Fill #2

## 2017-07-09 MED FILL — SYMBICORT 160-4.5 MCG INH: 160-4.5 | 30 days supply | Qty: 10 | Fill #2

## 2017-07-12 ENCOUNTER — Telehealth: Payer: Self-pay | Admitting: Acute Care

## 2017-07-12 ENCOUNTER — Ambulatory Visit (INDEPENDENT_AMBULATORY_CARE_PROVIDER_SITE_OTHER): Payer: 59 | Admitting: Primary Care

## 2017-07-12 ENCOUNTER — Other Ambulatory Visit: Payer: Self-pay | Admitting: Internal Medicine

## 2017-07-12 ENCOUNTER — Encounter: Payer: Self-pay | Admitting: Primary Care

## 2017-07-12 VITALS — BP 122/68 | HR 69 | Ht 61.0 in | Wt 126.0 lb

## 2017-07-12 DIAGNOSIS — I5042 Chronic combined systolic (congestive) and diastolic (congestive) heart failure: Secondary | ICD-10-CM | POA: Diagnosis not present

## 2017-07-12 DIAGNOSIS — R0600 Dyspnea, unspecified: Secondary | ICD-10-CM

## 2017-07-12 DIAGNOSIS — J449 Chronic obstructive pulmonary disease, unspecified: Secondary | ICD-10-CM | POA: Diagnosis not present

## 2017-07-12 DIAGNOSIS — F329 Major depressive disorder, single episode, unspecified: Secondary | ICD-10-CM | POA: Diagnosis not present

## 2017-07-12 DIAGNOSIS — I959 Hypotension, unspecified: Secondary | ICD-10-CM

## 2017-07-12 DIAGNOSIS — F419 Anxiety disorder, unspecified: Secondary | ICD-10-CM | POA: Diagnosis not present

## 2017-07-12 DIAGNOSIS — I480 Paroxysmal atrial fibrillation: Secondary | ICD-10-CM

## 2017-07-12 DIAGNOSIS — F32A Depression, unspecified: Secondary | ICD-10-CM

## 2017-07-12 MED ORDER — TIOTROPIUM BROMIDE MONOHYDRATE 2.5 MCG/ACT IN AERS: 2.0000 | INHALATION_SPRAY | Freq: Every day | RESPIRATORY_TRACT | 0 refills | Status: DC

## 2017-07-12 NOTE — Assessment & Plan Note (Signed)
Stable; regular rate and rhythm. Managed by cardiology. Continues eliquis and coreg (dose recently decreased)

## 2017-07-12 NOTE — Patient Instructions (Addendum)
Checking chest xray and labs today  Will set up overnight pulse oximetry (estimated time is 1 week)  Adding Spiriva, 2 puffs in am (for 2 week trial, please let us know how this is working and will send refills if needed)  Continues Symbicort twice daily as prescribed; Albuterol for wheeze/sob every 4 hours as needed   Follow up with primary care for anxiety management   Complete PFTs as schedule on Tuesday/or when stable   Follow up as planned with Dr. Lamonte Sakai on Aug 9th at 9:15am

## 2017-07-12 NOTE — Telephone Encounter (Signed)
Ov today with [name] NP and I will supervise eval as not comfortable trying to sort this out over the phone esp on a Friday as may end up in ER over the weekend if not addressed  Must bring all active meds/ neb solutions/ inhalers with her

## 2017-07-12 NOTE — Assessment & Plan Note (Signed)
Stable; patient reports home SBP range between 70-80. Spironolactone discontinued on most recent hosp. BP today 122/68. No reports of syncope or dizziness.

## 2017-07-12 NOTE — Assessment & Plan Note (Signed)
Patient appears anxious. Continues prozac and prn xanax. Follow up with PCP for management.

## 2017-07-12 NOTE — Assessment & Plan Note (Addendum)
Continues Symbicort twice daily. Symptoms recently exacerbated, using neb and rescue inhaler frequently. Exam in office benign. Add Spiriva, 2 week trial (sample given). Due to repeat PFTs on 07/09 if condition is stable. Has FU scheduled with Dr. Lamonte Sakai in August.   Discussed patient case with Dr. Melvyn Novas in office today

## 2017-07-12 NOTE — Assessment & Plan Note (Addendum)
Patient complains of awakening with sob at night. She does not appear to be fluid overloaded on exam. NO sob or cough in office. Symptoms more consistent with panic disorder. Will check CXR and BNP today. Also checking overnight pulse oximetry, if desat consider 2L oxygen at night  06/21/2017 showed an EF of 35 to 40%.  ARB and spironolactone discontinued during most recent hospitalization in June. Continues torsemide 20mg  BID.

## 2017-07-12 NOTE — Telephone Encounter (Signed)
Called spoke with patient who has 2 main issues:  1. Patient would like order for portable neb machine be sent to [facility] >> done 2. Patient stated that last night/early this morning she woke up with extreme shortness of breath and wheezing.  Patient stated she felt like her "air had been completely shut off" and wasn't sure she would even make it to her neb machine.  Episode lasted from 0200-0500.  Pt did not call EMS.  Patient took 3 Duoneb treatments and 6 puffs from her Ventolin before she could tell some type of improvement.  Pt did spend some time outside yesterday, but not much >> painted a lawn chair with spray paint and took her trash to the curb but did this in her vehicle.  Last ov 6.27.19 with Judson Roch NP Patient is scheduled for PFT on 7.9.19 @ 0900 and appt with RB on 8.9.19 Did schedule appt with [name] NP after the PFT on 7.9.19 @ 1000   Patient is requesting recommendations to hold her until the appt on 7.9.19 RB is not available Routing to DOD for recommendations.  Please advise.  Thank you.

## 2017-07-12 NOTE — Progress Notes (Signed)
@Patient  ID: Angela Shepherd, female    DOB: Mar 21, 1950, 67 y.o.   MRN: [id number]  Chief Complaint  Patient presents with  . Acute Visit    SOB and Cough for the last 2 days.    Referring provider: Midge Minium, MD  HPI: Angela Shepherd is a 67 y.o. female with COPD [FEV1 08%], Systolic HF (EF 65-78 with mild LVH, and moderately reduced systolic function), Hypotension, A.Fib on Eliquis, CKD stage 3 . She is followed by Dr. Lamonte Sakai in the office.  Recent hospitalization 06/13-6/17 for dyspnea, nausea and syncope. Patient had a syncopal episode while at a drug store. BP at that times was 71/57. BNP 781. Trop neg. Syncope was felt to be caused by hypotension d/t over diuresis. Resuscitated with IVFs. On 6/15 patient decompensated, was noted to be hypoxic. CXR showed worsening vascular congestion. Given lasix and placed on BiPAP. Transferred to ICU. She was started back on diuretics with improvement in resp status. Discharged home on 06/17, ARBs and spironolactone were discontinued. Coreg was decreased.    Follow Up OV 07/04/17: Pt. Presents to the office today. She states she has been doing well. She feels she is at her baseline. She is very focused on her recent hospitalizations.She has been admitted to the hospital 7 times since August 2018 for the same type of symptoms (She states symptoms are  No coughing , no wheezing, + rapid HR, can't talk or phonate.) Of note, there is documentation of wheezing per her most recent admission note.She is very focused on her frequent exacerbations .  She is using her Symbicort as prescribed.  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 She thinks she has asthma. She states these exacerbations are not responsive to her rescue inhaler. She states these always occur with change in temperature going from indoor to outdoor, or outdoor to indoor  . She appears anxious today.  She states she does feel like she has post nasal drip. She states she is compliant with her Flonase. She Denies nasal stuffiness.She denies fever, chest pain ,orthopnea or hemoptysis.  Test Results: CXR 06/24/2017 Stable pleural/parenchymal scarring in the right lung. Cardiomegaly. Small bilateral effusions. PFT 2018- FEV1 1.28 (62%), ratio 64 (Scheduled for PFT on 7/9)   07/12/2017  Presents today with acute complaints of sob/wheezing last night. States that she awoke at 2am not feeling right. States that she felt anxious, shaky,sweaty and nauseous. She took 1 tab xanax, a duoneb treatment and both her inhalers (symbicort and albuterol). She had no relief and repeated the above sequence. She states that she felt more calm and was then able to rest for 2 hours.   She feels paranoid and scared to leave the house d/t previous "attacks" and hospitalizations. States that the heat/humidity outside affects her breathing.    VSS today, BP 122/68, HR 69, O2 97% RA. Breathing is non-labored. Communicating with ease. No leg edema.     Allergies  Allergen Reactions  . Prednisone Other (See Comments)    SOB, CHF     Immunization History  Administered Date(s) Administered  . Hepatitis B, adult 06/14/2015, 07/15/2015  . Influenza,inj,Quad PF,6+ Mos 10/22/2012, 10/15/2013, 10/18/2014, 10/18/2015, 09/07/2016  . Pneumococcal Conjugate-13 02/08/2006  . Pneumococcal Polysaccharide-23 10/22/2012  . Tdap 05/04/2011  . Zoster 10/22/2012    Past Medical History:  Diagnosis Date  . Bradycardia  a. initial PPM placed 1994 at [facility] with gen change 2002. b. 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 Jude BiV PPM 05/08/12.  . Chronic systolic CHF (congestive heart failure) ([place])    a. EF 30-35% dx in 2011 -> most recent 40-45% by echo 04/2012. b. s/p upgrade to BiV-PPM 05/08/12. c. Med titration limited by hypotension.  . Cluster headache syndrome   . Hypertr  obst cardiomyop   . Hypokalemia   . Hypotension   . Paroxysmal atrial fibrillation (HCC)    a. identified on PPM interrogation b. longest episode 1 hour; if burden increases, will need [place]   . Pneumonia 2008    Tobacco History: Social History   Tobacco Use  Smoking Status Former Smoker  . Packs/day: 3.00  Smokeless Tobacco Never Used   Counseling given: Not Answered   Outpatient Medications Prior to Visit  Medication Sig Dispense Refill  . albuterol (PROVENTIL HFA;VENTOLIN HFA) 108 (90 Base) MCG/ACT inhaler Inhale 1-2 puffs into the lungs every 6 (six) hours as needed for wheezing or shortness of breath. 1 Inhaler 1  . ALPRAZolam (XANAX) 0.25 MG tablet Take 1 tablet (0.25 mg total) by mouth 2 (two) times daily as needed for anxiety. 30 tablet 3  . AMBULATORY NON FORMULARY MEDICATION Medication Name: 100% O2 15 leters a min for 15-20 mins  Via Nasal cannual (Patient taking differently: Medication Name: 100% O2 15 leters a min for 15-20 mins as needed  Via Nasal cannual) 1 Bottle 2  . amitriptyline (ELAVIL) 10 MG tablet Take 10 mg by mouth as needed (headaches).     Marland Kitchen aspirin 81 MG chewable tablet Chew 81 mg by mouth daily.    . carvedilol (COREG) 6.25 MG tablet Take 1 tablet (6.25 mg total) by mouth 2 (two) times daily with a meal. 60 tablet 1  . cyclobenzaprine (FLEXERIL) 5 MG tablet TAKE 1 TABLET (5 MG TOTAL) BY MOUTH 3 (THREE) TIMES DAILY AS NEEDED FOR MUSCLE SPASMS. 30 tablet 2  . diclofenac sodium (VOLTAREN) 1 % GEL Apply 2 g topically 4 (four) times daily. (Patient taking differently: Apply 2 g topically 4 (four) times daily as needed (pain). ) 3 Tube 0  . ELIQUIS 5 MG TABS tablet TAKE 1 TABLET BY MOUTH TWICE DAILY 60 tablet 2  . FLUoxetine (PROZAC) 20 MG capsule Take 1 capsule (20 mg total) by mouth daily. 30 capsule 3  . fluticasone (FLONASE) 50 MCG/ACT nasal spray PLACE 2 SPRAYS INTO BOTH NOSTRILS DAILY. (Patient taking differently: Place 2 sprays into both nostrils daily as  needed for allergies. ) 16 g 5  . ipratropium-albuterol (DUONEB) 0.5-2.5 (3) MG/3ML SOLN USE 1 VIAL (3ML) BY NEBULIZER EVERY 4 TO 6 HOURS AS NEEDED (Patient taking differently: Take 3 mLs by nebulization every 4 (four) hours as needed (shortness of breath). ) 90 mL 0  . loratadine (CLARITIN) 10 MG tablet Take 1 tablet (10 mg total) by mouth daily. 30 tablet 5  . montelukast (SINGULAIR) 10 MG tablet Take 1 tablet (10 mg total) by mouth at bedtime. 30 tablet 5  . nicotine (NICODERM CQ - DOSED IN MG/24 HOURS) 14 mg/24hr patch Place 1 patch (14 mg total) onto the skin daily. (Patient taking differently: Place 14 mg onto the skin daily as needed (nicotine withdrawal). ) 28 patch 0  . potassium chloride SA (K-DUR,KLOR-CON) 20 MEQ tablet Take 1 tablet (20 mEq total) by mouth daily. 90 tablet 3  . Spacer/Aero-Holding Chambers (AEROCHAMBER MV) inhaler  Use as instructed with Symbicort 1 each 0  . SUMAtriptan (IMITREX) 50 MG tablet TAKE 1 TABLET AT ONSET OF HEADACHE. MAY REPEAT IN 2 HOURS IF HEADACHE PERSISTS OR RECURS. (Patient taking differently: TAKE 1 TABLET (50MG ) AS NEEDED AT ONSET OF HEADACHE. MAY REPEAT IN 2 HOURS IF HEADACHE PERSISTS OR RECURS.) 10 tablet 6  . SYMBICORT 160-4.5 MCG/ACT inhaler INHALE 2 PUFFS BY MOUTH INTO THE LUNGS 2 TIMES A DAY 10.2 g 3  . topiramate (TOPAMAX) 50 MG tablet Take 50 mg by mouth daily.    Marland Kitchen torsemide (DEMADEX) 20 MG tablet Take 1 tablet (20 mg total) by mouth 2 (two) times daily. (Patient taking differently: Take 40 mg by mouth 2 (two) times daily. ) 60 tablet 6  . traMADol (ULTRAM) 50 MG tablet Take 0.5 tablets (25 mg total) by mouth every 12 (twelve) hours as needed. 12 tablet 0   No facility-administered medications prior to visit.       Review of Systems  Review of Systems  Constitutional: Negative.   Respiratory: Positive for shortness of breath and wheezing. Negative for cough.   Cardiovascular: Negative for chest pain and leg swelling.    Psychiatric/Behavioral: The patient is nervous/anxious.      Physical Exam  BP 122/68 (BP Location: Left Arm, Cuff Size: Normal)   Pulse 69   Ht 5\' 1"  (1.549 m)   Wt 126 lb (57.2 kg)   LMP  (LMP Unknown)   SpO2 97%   BMI 23.81 kg/m  Physical Exam  Constitutional: She is oriented to person, place, and time. Vital signs are normal. She appears cachectic. She is cooperative. No distress.  HENT:  Head: Normocephalic and atraumatic.  Eyes: Pupils are equal, round, and reactive to light.  Cardiovascular: Normal rate and normal pulses.  No LE edema   Pulmonary/Chest: No accessory muscle usage or stridor. No tachypnea. No respiratory distress. She has no decreased breath sounds. She has no wheezes. She has rhonchi in the left lower field.  Neurological: She is alert and oriented to person, place, and time. She is not disoriented.  Skin: Skin is warm, dry and intact.  Psychiatric: Her speech is normal and behavior is normal. Judgment and thought content normal. Her mood appears anxious. Cognition and memory are normal.     Lab Results:  CBC    Component Value Date/Time   WBC 6.5 07/04/2017 1008   RBC 3.75 (L) 07/04/2017 1008   HGB 12.6 07/04/2017 1008   HCT 36.3 07/04/2017 1008   PLT 316.0 07/04/2017 1008   MCV 96.8 07/04/2017 1008   MCH 33.4 06/24/2017 0353   MCHC 34.8 07/04/2017 1008   RDW 14.6 07/04/2017 1008   LYMPHSABS 1.4 07/04/2017 1008   MONOABS 0.5 07/04/2017 1008   EOSABS 0.2 07/04/2017 1008   BASOSABS 0.1 07/04/2017 1008    BMET    Component Value Date/Time   NA 141 07/02/2017 1451   K 4.6 07/02/2017 1451   CL 104 07/02/2017 1451   CO2 26 07/02/2017 1451   GLUCOSE 83 07/02/2017 1451   BUN 19 07/02/2017 1451   CREATININE 1.18 07/02/2017 1451   CREATININE 1.30 (H) 03/15/2015 0955   CALCIUM 9.7 07/02/2017 1451   GFRNONAA 47 (L) 06/24/2017 0353   GFRAA 54 (L) 06/24/2017 0353    BNP    Component Value Date/Time   BNP 781.6 (H) 06/20/2017 1644   BNP  161.0 (H) 01/20/2015 1647    ProBNP    Component Value Date/Time  PROBNP 958.2 (H) 09/03/2013 1544    Imaging: Dg Chest 2 View  Result Date: 06/24/2017 CLINICAL DATA:  Chest pain, shortness of breath EXAM: CHEST - 2 VIEW COMPARISON:  06/22/2017 FINDINGS: Left pacer remains in place, unchanged. Cardiomegaly. Chronic changes on the right with similar appearance of pleural and parenchymal scarring. Small bilateral pleural effusions. IMPRESSION: Stable pleural/parenchymal scarring in the right lung. Cardiomegaly. Small bilateral effusions. Electronically Signed   By: Rolm Baptise M.D.   On: 06/24/2017 08:44   Dg Chest 2 View  Result Date: 06/20/2017 CLINICAL DATA:  Shortness of breath EXAM: CHEST - 2 VIEW COMPARISON:  03/02/2017 FINDINGS: Cardiac shadow is enlarged but stable. Pacing device is again seen and stable. Chronic changes are again noted in the right mid lung. No new focal infiltrate or effusion is seen. No bony abnormality is noted. IMPRESSION: Stable chronic changes.  No acute abnormality is seen. Electronically Signed   By: Inez Catalina M.D.   On: 06/20/2017 17:13   Ct Head Wo Contrast  Result Date: 06/21/2017 CLINICAL DATA:  Syncope EXAM: CT HEAD WITHOUT CONTRAST TECHNIQUE: Contiguous axial images were obtained from the base of the skull through the vertex without intravenous contrast. COMPARISON:  None. FINDINGS: Brain: No evidence of acute infarction, hemorrhage, hydrocephalus, extra-axial collection or mass lesion/mass effect. Vascular: No hyperdense vessel or unexpected calcification. Minimal calcification at the carotid siphons. Skull: Normal. Negative for fracture or focal lesion. Sinuses/Orbits: No acute finding. Other: None IMPRESSION: Negative.  No CT evidence for acute intracranial abnormality. Electronically Signed   By: Donavan Foil M.D.   On: 06/21/2017 00:58   Dg Chest Port 1 View  Result Date: 06/22/2017 CLINICAL DATA:  Respiratory distress EXAM: PORTABLE CHEST 1  VIEW COMPARISON:  06/20/2016, CT chest 03/02/2017 FINDINGS: Left-sided pacing device as before. Cardiomegaly with mild vascular congestion. Diffuse interstitial opacity, likely chronic scarring and fibrosis. Blunting of the right CP angle consistent with pleural scarring. Similar appearance of right peripheral pulmonary scarring. Probable skin fold artifact at the right apex. IMPRESSION: 1. Cardiomegaly with mild central congestion 2. Similar appearance of pleural and parenchymal scarring in the right thorax 3. Suspected skin fold artifacts at the lung apices. Electronically Signed   By: Donavan Foil M.D.   On: 06/22/2017 01:11   Mm 3d Screen Breast Bilateral  Result Date: 07/03/2017 CLINICAL DATA:  Screening. EXAM: DIGITAL SCREENING BILATERAL MAMMOGRAM WITH TOMO AND CAD COMPARISON:  Previous exam(s). ACR Breast Density Category c: The breast tissue is heterogeneously dense, which may obscure small masses. FINDINGS: There are no findings suspicious for malignancy. Images were processed with CAD. IMPRESSION: No mammographic evidence of malignancy. A result letter of this screening mammogram will be mailed directly to the patient. RECOMMENDATION: Screening mammogram in one year. (Code:SM-B-01Y) BI-RADS CATEGORY  1: Negative. Electronically Signed   By: Trude Mcburney M.D.   On: 07/03/2017 10:40     Assessment & Plan:   Hypotension Stable; patient reports home SBP range between 70-80. Spironolactone discontinued on most recent hosp. BP today 122/68. No reports of syncope or dizziness.   Chronic combined systolic (congestive) and diastolic (congestive) heart failure (HCC) Patient complains of awakening with sob at night. She does not appear to be fluid overloaded on exam. NO sob or cough in office. Symptoms more consistent with panic disorder. Will check CXR and BNP today. Also checking overnight pulse oximetry, if desat consider 2L oxygen at night  06/21/2017 showed an EF of 35 to 40%.  ARB and  spironolactone discontinued  during most recent hospitalization in June. Continues torsemide 20mg  BID.   Paroxysmal atrial fibrillation (HCC) Stable; regular rate and rhythm. Managed by cardiology. Continues eliquis and coreg (dose recently decreased)  Anxiety and depression Patient appears anxious. Continues prozac and prn xanax. Follow up with PCP for management.   COPD (chronic obstructive pulmonary disease) (HCC) Continues Symbicort twice daily. Symptoms recently exacerbated, using neb and rescue inhaler frequently. Exam in office benign. Add Spiriva, 2 week trial (sample given). Due to repeat PFTs on 07/09 if condition is stable. Has FU scheduled with Dr. Lamonte Sakai in August.   Discussed patient case with Dr. Melvyn Novas in office today   Martyn Ehrich, NP 07/12/2017

## 2017-07-12 NOTE — Telephone Encounter (Signed)
Called spoke with patient, discussed MW's recommendations as stated below Appt scheduled for Angela Shepherd's NP first available this afternoon at 3pm Pt is aware to bring ALL active medications/nebs/inhalers to her appt  Nothing further needed at this time; will sign off  Routing to MW to make him aware of the appt time

## 2017-07-15 ENCOUNTER — Telehealth: Payer: Self-pay | Admitting: Primary Care

## 2017-07-15 ENCOUNTER — Telehealth: Payer: Self-pay | Admitting: Cardiology

## 2017-07-15 ENCOUNTER — Encounter: Payer: 59 | Admitting: *Deleted

## 2017-07-15 NOTE — Telephone Encounter (Signed)
LMOVM reminding pt to send remote transmission.   

## 2017-07-15 NOTE — Telephone Encounter (Signed)
Patient returned call  Patient stated that "today is the first day where she has felt half-way normal" but she did double up on her alprazolam last night before bed.  Typically she would wake up with her "heart pounding out of her chest" and feeling dyspneic but this morning she woke up fine, put on her clothes and went on to work.  Pt is okay with following up with PCP regarding her anxiety, but is requesting [name] NP to please reach out to Midge Minium, MD to let her know patient's symptoms first.  Also, patient apologized for not getting her cxr and labs at the day of visit - she was under the impression at discharge that she was to get these done on the day of her PFT.  Apologized to patient for the confusion and she will still have tests done on 7.22.19 prior to her PFT.  Routing to Trail NP to let her know of the above

## 2017-07-15 NOTE — Telephone Encounter (Signed)
Please check to see if pt got chest xray/labs done that were ordered on Friday after seeing me. If not, how is she doing?

## 2017-07-16 ENCOUNTER — Ambulatory Visit: Payer: 59 | Admitting: Primary Care

## 2017-07-17 ENCOUNTER — Encounter: Payer: Self-pay | Admitting: Cardiology

## 2017-07-17 ENCOUNTER — Other Ambulatory Visit: Payer: Self-pay | Admitting: *Deleted

## 2017-07-17 NOTE — Patient Outreach (Signed)
[facility]) Care Management  07/17/2017  Angela Shepherd 28-Jun-1950 [id number]   No response from patient outreach attempts will proceed with case closure.     Objective:Per KPN (Knowledge Performance Now, point of care tool) and chart review,patient hospitalized 06/20/17 - 06/24/17 for syncope. [facility]17 [facility] [place] [place] Patient hospitalized 01/15/17 -01/18/17 for shortness of breath. Patient hospitalized 08/22/16 -08/23/16 for Shortness of breath, Lactic acidosis, Hypercapnia, and Congestive heart failure, unspecified HF chronicity, unspecified heart failure type. Hospitalized at [facility], [place]. Patient also has a history of asthma, COPD, HOCM (hypertrophic obstructive cardiomyopathy), cardiac pacemaker,Paroxysmal atrial fibrillation,and acute on chronic systolic congestive heart failure.[facility] Care Management completed transition of care follow up on 03/28/17.    Assessment: Received UMR Transition of care referral on6/20/19. Transition of care follow up not completed due to unable to contact patient and will proceed with case closure.      Plan:Case closure due to unable to reach.        Eduard Penkala H. Annia Friendly, BSN, [place] Management [facility] Telephonic CM Phone: [phone] Fax: [phone]

## 2017-07-18 DIAGNOSIS — J449 Chronic obstructive pulmonary disease, unspecified: Secondary | ICD-10-CM | POA: Diagnosis not present

## 2017-07-25 MED FILL — ALPRAZolam 0.25 MG TABS: 0.25 | 15 days supply | Qty: 30 | Fill #2

## 2017-07-29 ENCOUNTER — Ambulatory Visit (INDEPENDENT_AMBULATORY_CARE_PROVIDER_SITE_OTHER)
Admission: RE | Admit: 2017-07-29 | Discharge: 2017-07-29 | Disposition: A | Discharge status: Home / Self Care | Payer: 59 | Source: Ambulatory Visit | Attending: Primary Care | Admitting: Primary Care

## 2017-07-29 ENCOUNTER — Ambulatory Visit (INDEPENDENT_AMBULATORY_CARE_PROVIDER_SITE_OTHER): Payer: 59 | Admitting: Primary Care

## 2017-07-29 ENCOUNTER — Telehealth: Payer: Self-pay | Admitting: Primary Care

## 2017-07-29 ENCOUNTER — Ambulatory Visit (INDEPENDENT_AMBULATORY_CARE_PROVIDER_SITE_OTHER): Payer: 59 | Admitting: Emergency Medicine

## 2017-07-29 ENCOUNTER — Encounter: Payer: Self-pay | Admitting: Primary Care

## 2017-07-29 ENCOUNTER — Other Ambulatory Visit (INDEPENDENT_AMBULATORY_CARE_PROVIDER_SITE_OTHER): Payer: 59

## 2017-07-29 VITALS — BP 124/78 | HR 77 | Ht 61.0 in | Wt 125.0 lb

## 2017-07-29 DIAGNOSIS — R400 Somnolence: Secondary | ICD-10-CM

## 2017-07-29 DIAGNOSIS — J449 Chronic obstructive pulmonary disease, unspecified: Secondary | ICD-10-CM | POA: Diagnosis not present

## 2017-07-29 DIAGNOSIS — R0600 Dyspnea, unspecified: Secondary | ICD-10-CM | POA: Diagnosis not present

## 2017-07-29 DIAGNOSIS — I5032 Chronic diastolic (congestive) heart failure: Secondary | ICD-10-CM | POA: Diagnosis not present

## 2017-07-29 DIAGNOSIS — G4734 Idiopathic sleep related nonobstructive alveolar hypoventilation: Secondary | ICD-10-CM

## 2017-07-29 DIAGNOSIS — J439 Emphysema, unspecified: Secondary | ICD-10-CM | POA: Diagnosis not present

## 2017-07-29 LAB — BASIC METABOLIC PANEL
BUN: 13 mg/dL (ref 6–23)
CHLORIDE: 107 meq/L (ref 96–112)
CO2: 24 mEq/L (ref 19–32)
CREATININE: 1.17 mg/dL (ref 0.40–1.20)
Calcium: 8.7 mg/dL (ref 8.4–10.5)
GFR: 49.22 mL/min — ABNORMAL LOW (ref >60.00)
GLUCOSE: 103 mg/dL — AB (ref 70–99)
Potassium: 4.4 mEq/L (ref 3.5–5.1)
Sodium: 139 mEq/L (ref 135–145)

## 2017-07-29 LAB — PULMONARY FUNCTION TEST
DL/VA % PRED: 65 %
DL/VA: 2.76 ml/min/mmHg/L
DLCO COR % PRED: 43 %
DLCO UNC: 7.91 ml/min/mmHg
DLCO cor: 8.12 ml/min/mmHg
DLCO unc % pred: 41 %
FEF 25-75 PRE: 0.3 L/s
FEF 25-75 Post: 0.44 L/sec
FEF2575-%Change-Post: 45 %
FEF2575-%PRED-PRE: 16 %
FEF2575-%Pred-Post: 23 %
FEV1-%CHANGE-POST: 9 %
FEV1-%PRED-POST: 41 %
FEV1-%Pred-Pre: 37 %
FEV1-PRE: 0.77 L
FEV1-Post: 0.84 L
FEV1FVC-%Change-Post: 0 %
FEV1FVC-%PRED-PRE: 76 %
FEV6-%CHANGE-POST: 9 %
FEV6-%PRED-POST: 55 %
FEV6-%Pred-Pre: 50 %
FEV6-POST: 1.42 L
FEV6-Pre: 1.3 L
FEV6FVC-%CHANGE-POST: -1 %
FEV6FVC-%PRED-POST: 102 %
FEV6FVC-%Pred-Pre: 103 %
FVC-%Change-Post: 10 %
FVC-%Pred-Post: 54 %
FVC-%Pred-Pre: 48 %
FVC-Post: 1.44 L
FVC-Pre: 1.3 L
PRE FEV1/FVC RATIO: 59 %
Post FEV1/FVC ratio: 59 %
Post FEV6/FVC ratio: 99 %
Pre FEV6/FVC Ratio: 100 %

## 2017-07-29 LAB — BRAIN NATRIURETIC PEPTIDE: PRO B NATRI PEPTIDE: 1131 pg/mL — AB (ref 0.0–100.0)

## 2017-07-29 MED FILL — CARVEDILOL 12.5 MG TABLET: 12.5 | 30 days supply | Qty: 60 | Fill #3

## 2017-07-29 NOTE — Assessment & Plan Note (Addendum)
-   PFTs 7/22 - FVC 1.30 ( 48%), FEV1 0.77 (37%), Ratio 59 (76%), DLCO 8.12 (43%) - CXR showing no acute disease  - Added Spiriva recently, continue Symbicort 160-4.5 MCG/ACT - FU in 1 week

## 2017-07-29 NOTE — Telephone Encounter (Signed)
Attempted to call Patient, to verify dosage and message left. Per message, Patient has 10mg  tablets.  Per OV instructions below, she should take 3, 10mg  tabs in AM, and PM for 4 days.  07/29/17 OV- Verify strength of Torsemide tablets please call and leaves message with office)  If 10mg  tablets; please take 3 tabs in am and pm x 4 days (30mg  twice a day) -IF SBP <90 do not take diuretic

## 2017-07-29 NOTE — Progress Notes (Signed)
@Patient  ID: Angela Shepherd, female    DOB: [birth year], 67 y.o.   MRN: [id number]  Chief Complaint  Patient presents with  . Follow-up    review PFT-      Referring provider: Midge Minium, MD  HPI: 67 y.o. female with COPD [FEV1 95%], Systolic HF (EF 63-87 with mild LVH, and moderately reduced systolic function), Hypotension, A.Fib on Eliquis, CKD stage 3 . She is followed by Dr. Lamonte Sakai in the office. Last seen on 7/5 for shortness of breath. Ordered for CXR and BNP at that time but patient did not complete because she thought she was to do them on the day of her PFTs.  Recent hospitalization 06/13-6/17 for dyspnea, nausea and syncope. Patient had a syncopal episode while at a drug store. BP at that times was 71/57. BNP 781. Trop neg. Syncope was felt to be caused by hypotension d/t over diuresis. Resuscitated with IVFs. On 6/15 patient decompensated, was noted to be hypoxic. CXR showed worsening vascular congestion. Given lasix and placed on BiPAP. Transferred to ICU. She was started back on diuretics with improvement in resp status. Discharged home on 06/17, ARBs and spironolactone were discontinued. Coreg was decreased.    Follow Up OV 07/04/17: Pt. Presents to the office today. She states she has been doing well. She feels she is at her baseline. She is very focused on her recent hospitalizations.She has been admitted to the hospital 7 times since August 2018 for the same type of symptoms (She states symptoms are  No coughing , no wheezing, + rapid HR, can't talk or phonate.) Of note, there is documentation of wheezing per her most recent admission note.She is very focused on her frequent exacerbations .  She is using her Symbicort as prescribed.  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 She thinks she has asthma. She states these exacerbations are not responsive to  her rescue inhaler. She states these always occur with change in temperature going from indoor to outdoor, or outdoor to indoor . She appears anxious today.  She states she does feel like she has post nasal drip. She states she is compliant with her Flonase. She denies fever, chest pain, orthopnea or hemoptysis.  Test Results: CXR 06/24/2017- Stable pleural/parenchymal scarring in the right lung. Cardiomegaly. Small bilateral effusions. PFT 2018- FEV1 1.28 (62%), ratio 64 (Scheduled for PFT on 7/9)  07/29/2017 Presents today for follow-up visit for shortness of breath. States that she has had three episodes of sob occurring at night. She checked her pulse ox herself during an episode and her O2 was 87% RA. Xanax and neb tx help improve her symptoms at night. PFTs completed today showing moderate-severe restriction, no obstruction. Minimal bronchodilator response. CXR today showing no acute disease. ProBNP 1,131. Hx systolic heart failure, takes torsemide 2 tabs BID (she believes she has 10mg  tablets). Follows with Dr. Haroldine Laws with cardiology. Denies sob during the day, cough, wheeze.    PFTs 7/22 - FVC 1.30 ( 48%), FEV1 0.77 (37%), Ratio 59 (76%), DLCO 8.12 (43%)   Allergies  Allergen Reactions  . Prednisone Other (See Comments)    SOB, CHF     Immunization History  Administered Date(s) Administered  . Hepatitis B, adult 06/14/2015, 07/15/2015  . Influenza,inj,Quad PF,6+ Mos 10/22/2012, 10/15/2013, 10/18/2014, 10/18/2015, 09/07/2016  . Pneumococcal Conjugate-13 02/08/2006  . Pneumococcal Polysaccharide-23 10/22/2012  . Tdap  05/04/2011  . Zoster 10/22/2012    Past Medical History:  Diagnosis Date  . Bradycardia    a. initial PPM placed 1994 at [facility] with gen change 2002. b. 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 Jude BiV PPM 05/08/12.  . Chronic systolic CHF (congestive heart failure) ([place])    a. EF 30-35% dx in 2011 -> most recent 40-45% by echo 04/2012. b. s/p  upgrade to BiV-PPM 05/08/12. c. Med titration limited by hypotension.  . Cluster headache syndrome   . Hypertr obst cardiomyop   . Hypokalemia   . Hypotension   . Paroxysmal atrial fibrillation (HCC)    a. identified on PPM interrogation b. longest episode 1 hour; if burden increases, will need [place]   . Pneumonia 2008    Tobacco History: Social History   Tobacco Use  Smoking Status Former Smoker  . Packs/day: 3.00  Smokeless Tobacco Never Used   Counseling given: Not Answered   Outpatient Medications Prior to Visit  Medication Sig Dispense Refill  . albuterol (PROVENTIL HFA;VENTOLIN HFA) 108 (90 Base) MCG/ACT inhaler Inhale 1-2 puffs into the lungs every 6 (six) hours as needed for wheezing or shortness of breath. 1 Inhaler 1  . ALPRAZolam (XANAX) 0.25 MG tablet Take 1 tablet (0.25 mg total) by mouth 2 (two) times daily as needed for anxiety. 30 tablet 3  . AMBULATORY NON FORMULARY MEDICATION Medication Name: 100% O2 15 leters a min for 15-20 mins  Via Nasal cannual (Patient taking differently: Medication Name: 100% O2 15 leters a min for 15-20 mins as needed  Via Nasal cannual) 1 Bottle 2  . amitriptyline (ELAVIL) 10 MG tablet Take 10 mg by mouth as needed (headaches).     Marland Kitchen aspirin 81 MG chewable tablet Chew 81 mg by mouth daily.    . carvedilol (COREG) 6.25 MG tablet Take 1 tablet (6.25 mg total) by mouth 2 (two) times daily with a meal. 60 tablet 1  . cyclobenzaprine (FLEXERIL) 5 MG tablet TAKE 1 TABLET (5 MG TOTAL) BY MOUTH 3 (THREE) TIMES DAILY AS NEEDED FOR MUSCLE SPASMS. 30 tablet 2  . diclofenac sodium (VOLTAREN) 1 % GEL Apply 2 g topically 4 (four) times daily. (Patient taking differently: Apply 2 g topically 4 (four) times daily as needed (pain). ) 3 Tube 0  . ELIQUIS 5 MG TABS tablet TAKE 1 TABLET BY MOUTH TWICE DAILY 60 tablet 2  . FLUoxetine (PROZAC) 20 MG capsule Take 1 capsule (20 mg total) by mouth daily. 30 capsule 3  . fluticasone (FLONASE) 50 MCG/ACT nasal spray  PLACE 2 SPRAYS INTO BOTH NOSTRILS DAILY. (Patient taking differently: Place 2 sprays into both nostrils daily as needed for allergies. ) 16 g 5  . ipratropium-albuterol (DUONEB) 0.5-2.5 (3) MG/3ML SOLN USE 1 VIAL (3ML) BY NEBULIZER EVERY 4 TO 6 HOURS AS NEEDED (Patient taking differently: Take 3 mLs by nebulization every 4 (four) hours as needed (shortness of breath). ) 90 mL 0  . loratadine (CLARITIN) 10 MG tablet Take 1 tablet (10 mg total) by mouth daily. 30 tablet 5  . montelukast (SINGULAIR) 10 MG tablet Take 1 tablet (10 mg total) by mouth at bedtime. 30 tablet 5  . nicotine (NICODERM CQ - DOSED IN MG/24 HOURS) 14 mg/24hr patch Place 1 patch (14 mg total) onto the skin daily. (Patient taking differently: Place 14 mg onto the skin daily as needed (nicotine withdrawal). ) 28 patch 0  . Spacer/Aero-Holding Chambers (AEROCHAMBER MV) inhaler Use  as instructed with Symbicort 1 each 0  . SUMAtriptan (IMITREX) 50 MG tablet TAKE 1 TABLET AT ONSET OF HEADACHE. MAY REPEAT IN 2 HOURS IF HEADACHE PERSISTS OR RECURS. (Patient taking differently: TAKE 1 TABLET (50MG ) AS NEEDED AT ONSET OF HEADACHE. MAY REPEAT IN 2 HOURS IF HEADACHE PERSISTS OR RECURS.) 10 tablet 6  . SYMBICORT 160-4.5 MCG/ACT inhaler INHALE 2 PUFFS BY MOUTH INTO THE LUNGS 2 TIMES A DAY 10.2 g 3  . Tiotropium Bromide Monohydrate (SPIRIVA RESPIMAT) 2.5 MCG/ACT AERS Inhale 2 puffs into the lungs daily. 1 Inhaler 0  . topiramate (TOPAMAX) 50 MG tablet Take 50 mg by mouth daily.    Marland Kitchen torsemide (DEMADEX) 20 MG tablet Take 1 tablet (20 mg total) by mouth 2 (two) times daily. (Patient taking differently: Take 40 mg by mouth 2 (two) times daily. ) 60 tablet 6  . traMADol (ULTRAM) 50 MG tablet Take 0.5 tablets (25 mg total) by mouth every 12 (twelve) hours as needed. 12 tablet 0  . potassium chloride SA (K-DUR,KLOR-CON) 20 MEQ tablet Take 1 tablet (20 mEq total) by mouth daily. 90 tablet 3   No facility-administered medications prior to visit.        Review of Systems  Review of Systems  Constitutional: Negative.   HENT: Negative.   Respiratory: Positive for shortness of breath. Negative for cough, choking, chest tightness, wheezing and stridor.        ? Apnea   Cardiovascular: Negative.   Neurological: Negative.   Psychiatric/Behavioral: The patient is nervous/anxious.      Physical Exam  BP 124/78 (BP Location: Left Arm, Cuff Size: Normal)   Pulse 77   Ht 5\' 1"  (1.549 m)   Wt 125 lb (56.7 kg)   LMP  (LMP Unknown)   SpO2 96%   BMI 23.62 kg/m  Physical Exam  Constitutional: She is oriented to person, place, and time. She appears well-developed and well-nourished.  HENT:  Head: Normocephalic and atraumatic.  Eyes: Pupils are equal, round, and reactive to light. EOM are normal.  Neck: Normal range of motion. Neck supple.  Cardiovascular: Normal rate, regular rhythm and normal heart sounds.  No murmur heard. Pulmonary/Chest: Effort normal and breath sounds normal. No respiratory distress. She has no wheezes.  Abdominal: Soft. Bowel sounds are normal. There is no tenderness.  Neurological: She is alert and oriented to person, place, and time.  Skin: Skin is warm and dry. No rash noted. No erythema.  Psychiatric: She has a normal mood and affect. Her behavior is normal. Judgment normal.     Lab Results:  CBC    Component Value Date/Time   WBC 6.5 07/04/2017 1008   RBC 3.75 (L) 07/04/2017 1008   HGB 12.6 07/04/2017 1008   HCT 36.3 07/04/2017 1008   PLT 316.0 07/04/2017 1008   MCV 96.8 07/04/2017 1008   MCH 33.4 06/24/2017 0353   MCHC 34.8 07/04/2017 1008   RDW 14.6 07/04/2017 1008   LYMPHSABS 1.4 07/04/2017 1008   MONOABS 0.5 07/04/2017 1008   EOSABS 0.2 07/04/2017 1008   BASOSABS 0.1 07/04/2017 1008    BMET    Component Value Date/Time   NA 139 07/29/2017 1047   K 4.4 07/29/2017 1047   CL 107 07/29/2017 1047   CO2 24 07/29/2017 1047   GLUCOSE 103 (H) 07/29/2017 1047   BUN 13 07/29/2017 1047    CREATININE 1.17 07/29/2017 1047   CREATININE 1.30 (H) 03/15/2015 0955   CALCIUM 8.7 07/29/2017 1047   GFRNONAA  47 (L) 06/24/2017 0353   GFRAA 54 (L) 06/24/2017 0353    BNP    Component Value Date/Time   BNP 781.6 (H) 06/20/2017 1644   BNP 161.0 (H) 01/20/2015 1647    ProBNP    Component Value Date/Time   PROBNP 1,131.0 (H) 07/29/2017 1047    Imaging: Dg Chest 2 View  Result Date: 07/29/2017 CLINICAL DATA:  Dyspnea for 1 year. EXAM: CHEST - 2 VIEW COMPARISON:  CT chest 03/02/2017. PA and lateral chest 06/24/2017. Single-view of the chest 06/22/2017 and 10/16/2016. FINDINGS: The lungs are emphysematous. Calcified pleural plaques on the right are noted and better seen on prior CT. Chronic blunting of the right costophrenic angle is compatible with scar. Scarring is also identified in the right middle and lower lobes. Subsegmental atelectasis left lung base noted. There is cardiomegaly. Pacing device is in place. Atherosclerosis is noted. IMPRESSION: No acute disease. Cardiomegaly. Emphysema. Pleural and parenchymal scarring. Atherosclerosis. Electronically Signed   By: Inge Rise M.D.   On: 07/29/2017 11:05   Mm 3d Screen Breast Bilateral  Result Date: 07/03/2017 CLINICAL DATA:  Screening. EXAM: DIGITAL SCREENING BILATERAL MAMMOGRAM WITH TOMO AND CAD COMPARISON:  Previous exam(s). ACR Breast Density Category c: The breast tissue is heterogeneously dense, which may obscure small masses. FINDINGS: There are no findings suspicious for malignancy. Images were processed with CAD. IMPRESSION: No mammographic evidence of malignancy. A result letter of this screening mammogram will be mailed directly to the patient. RECOMMENDATION: Screening mammogram in one year. (Code:SM-B-01Y) BI-RADS CATEGORY  1: Negative. Electronically Signed   By: Trude Mcburney M.D.   On: 07/03/2017 10:40     Assessment & Plan:   Chronic diastolic heart failure - Complains of SOB at night, x3 episodes  -  VSS, BP 124/78 - BNP 1,131 - Instructed to take additional Torsemide 10mg  twice day x4 days   - BMET showing normal creatine/BUN and potassium  - FU with cardiology, will also send note  - Return in 1 week with labs prior to apt   COPD (chronic obstructive pulmonary disease) (HCC) - PFTs 7/22 - FVC 1.30 ( 48%), FEV1 0.77 (37%), Ratio 59 (76%), DLCO 8.12 (43%) - CXR showing no acute disease  - Added Spiriva recently, continue Symbicort 160-4.5 MCG/ACT - FU in 1 week   Nocturnal oxygen desaturation - Check overnight pulse oximetry  - Home sleep study ?apnea     Martyn Ehrich, NP 07/29/2017

## 2017-07-29 NOTE — Patient Instructions (Addendum)
Take BOTH symbicort 2 puffs twice a day and spiriva 2 puffs in am only   Verify strength of Torsemide tablets please call and leaves message with office)  If 10mg  tablets; please take 3 tabs in am and pm x 4 days (30mg  twice a day) -IF SBP <90 do not take diuretic  Set up over night oximetry and home sleep test   Follow up with cardiology  FU in 1 week with Eustaquio Maize, NP (Please get labs done 25mins prior to appointment)

## 2017-07-29 NOTE — Assessment & Plan Note (Signed)
-   Check overnight pulse oximetry  - Home sleep study ?apnea

## 2017-07-29 NOTE — Progress Notes (Signed)
PFT done today. 

## 2017-07-29 NOTE — Assessment & Plan Note (Addendum)
-   Complains of SOB at night, x3 episodes  - VSS, BP 124/78 - BNP 1,131 - Instructed to take additional Torsemide 10mg  twice day x4 days   - BMET showing normal creatine/BUN and potassium  - FU with cardiology, will also send note  - Return in 1 week with labs prior to apt

## 2017-07-30 NOTE — Telephone Encounter (Signed)
Patient returned call, CB is [phone]

## 2017-07-30 NOTE — Telephone Encounter (Signed)
Spoke with pt and verified dose and strength of Torsemide. Pt aware to take Torsemide 10mg  3 tabs in am and 3 tabs in pm x 4 days.  PT given appt with Derl Barrow 08/05/17 4:00 and is aware to come 30 minutes prior to have blood work drawn.

## 2017-08-01 ENCOUNTER — Encounter: Payer: Self-pay | Admitting: Primary Care

## 2017-08-01 ENCOUNTER — Other Ambulatory Visit: Payer: Self-pay | Admitting: Family Medicine

## 2017-08-01 MED FILL — TORSEMIDE 10 MG TABLET: 10 | 30 days supply | Qty: 120 | Fill #4

## 2017-08-01 MED FILL — FLUoxetine HCL 20 MG CAPS: 20 | 30 days supply | Qty: 30 | Fill #0

## 2017-08-05 ENCOUNTER — Telehealth: Payer: Self-pay | Admitting: Primary Care

## 2017-08-05 ENCOUNTER — Other Ambulatory Visit (INDEPENDENT_AMBULATORY_CARE_PROVIDER_SITE_OTHER): Payer: 59

## 2017-08-05 ENCOUNTER — Ambulatory Visit (INDEPENDENT_AMBULATORY_CARE_PROVIDER_SITE_OTHER): Payer: 59 | Admitting: Primary Care

## 2017-08-05 ENCOUNTER — Encounter: Payer: Self-pay | Admitting: Primary Care

## 2017-08-05 DIAGNOSIS — I5042 Chronic combined systolic (congestive) and diastolic (congestive) heart failure: Secondary | ICD-10-CM

## 2017-08-05 DIAGNOSIS — J449 Chronic obstructive pulmonary disease, unspecified: Secondary | ICD-10-CM | POA: Diagnosis not present

## 2017-08-05 DIAGNOSIS — J810 Acute pulmonary edema: Secondary | ICD-10-CM | POA: Diagnosis not present

## 2017-08-05 DIAGNOSIS — I5032 Chronic diastolic (congestive) heart failure: Secondary | ICD-10-CM

## 2017-08-05 DIAGNOSIS — I959 Hypotension, unspecified: Secondary | ICD-10-CM

## 2017-08-05 LAB — BASIC METABOLIC PANEL
BUN: 21 mg/dL (ref 6–23)
CALCIUM: 9.3 mg/dL (ref 8.4–10.5)
CO2: 27 mEq/L (ref 19–32)
CREATININE: 1.35 mg/dL — AB (ref 0.40–1.20)
Chloride: 103 mEq/L (ref 96–112)
GFR: 41.73 mL/min — ABNORMAL LOW (ref >60.00)
GLUCOSE: 109 mg/dL — AB (ref 70–99)
POTASSIUM: 3.6 meq/L (ref 3.5–5.1)
Sodium: 141 mEq/L (ref 135–145)

## 2017-08-05 LAB — BRAIN NATRIURETIC PEPTIDE: Pro B Natriuretic peptide (BNP): 1698 pg/mL — ABNORMAL HIGH (ref 0.0–100.0)

## 2017-08-05 MED ORDER — BUDESONIDE-FORMOTEROL FUMARATE 160-4.5 MCG/ACT IN AERO: INHALATION_SPRAY | RESPIRATORY_TRACT | 1 refills | Status: DC

## 2017-08-05 MED ORDER — IPRATROPIUM-ALBUTEROL 0.5-2.5 (3) MG/3ML IN SOLN: RESPIRATORY_TRACT | 1 refills | Status: DC

## 2017-08-05 MED ORDER — TIOTROPIUM BROMIDE MONOHYDRATE 2.5 MCG/ACT IN AERS: 2.0000 | INHALATION_SPRAY | Freq: Every day | RESPIRATORY_TRACT | 1 refills | Status: DC

## 2017-08-05 MED FILL — SPIRIVA RESPIMAT INHAL SPRY: 2.5 | 90 days supply | Qty: 12 | Fill #0

## 2017-08-05 MED FILL — SYMBICORT 160-4.5 MCG INH: 160-4.5 | 90 days supply | Qty: 31 | Fill #0

## 2017-08-05 MED FILL — IPRAT-ALBUT 0.5-3(2.5) MG/3: 0.5-2.5 (3) | 15 days supply | Qty: 270 | Fill #0

## 2017-08-05 NOTE — Assessment & Plan Note (Signed)
Stable; Most recent CXR 7/22 showing no acute disease

## 2017-08-05 NOTE — Telephone Encounter (Signed)
Spoke with pt. She is aware of results. Nothing further was needed.  

## 2017-08-05 NOTE — Progress Notes (Signed)
@Patient  ID: Angela Shepherd, female    DOB: Oct 17, 1950, 67 y.o.   MRN: [id number]  Chief Complaint  Patient presents with  . Follow-up    Referring provider: Midge Minium, MD  HPI: 67 y.o. female with COPD [FEV1 94%], Systolic HF (EF 49-67%), Hypotension, A.Fib on Eliquis, CKD stage 3 . She is followed by Dr. Lamonte Sakai in the office.  Recent hospitalization 06/13-6/17 for dyspnea, nausea and syncope. Patient had a syncopal episode while at a drug store. BP at that times was 71/57. BNP 781. Trop neg. Syncope was felt to be caused by hypotension d/t over diuresis. Resuscitated with IVFs. On 6/15 patient decompensated, was noted to be hypoxic. CXR showed worsening vascular congestion. Given lasix and placed on BiPAP. Transferred to ICU. She was started back on diuretics with improvement in resp status. Discharged home on 06/17, ARBs and spironolactone were discontinued. Coreg was decreased.    07/29/2017 Presents today for follow-up visit for shortness of breath. States that she has had three episodes of sob occurring at night. She checked her pulse ox herself during an episode and her O2 was 87% RA. Xanax and neb tx help improve her symptoms at night. PFTs completed today showing moderate-severe restriction, no obstruction. Minimal bronchodilator response. CXR today showing no acute disease. ProBNP 1,131. Hx systolic heart failure, takes torsemide 2 tabs BID (she believes she has 10mg  tablets). Follows with Dr. Haroldine Laws with cardiology. Denies sob during the day, cough, wheeze.    PFTs 7/22 - FVC 1.30 ( 48%), FEV1 0.77 (37%), Ratio 59 (76%), DLCO 8.12 (43%)  08/05/2017  Patient presents today for one-week follow-up appointment.  Last seen on July 22 for dyspnea, proBNP 1100.  Instructed to take an additional 20 mg to torsemide for 4 days.  Labs obtained prior within normal limits.    Feels better today, she has had no episodes of shortness of breath at night. She is down 1 pound.  She has  been using Spiriva 2 puffs daily and thinks that it has been helping.  She did call cardiology, they did not want to make any changes as long as she was feeling well.  She has scheduled follow-ups with Dr. Lamonte Sakai on August 9 and Dr. Haroldine Laws in September.  She completed labs today and overnight oximetry, pending results.   Allergies  Allergen Reactions  . Prednisone Other (See Comments)    SOB, CHF     Immunization History  Administered Date(s) Administered  . Hepatitis B, adult 06/14/2015, 07/15/2015  . Influenza,inj,Quad PF,6+ Mos 10/22/2012, 10/15/2013, 10/18/2014, 10/18/2015, 09/07/2016  . Pneumococcal Conjugate-13 02/08/2006  . Pneumococcal Polysaccharide-23 10/22/2012  . Tdap 05/04/2011  . Zoster 10/22/2012    Past Medical History:  Diagnosis Date  . Bradycardia    a. initial PPM placed 1994 at [facility] with gen change 2002. b. 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 Jude BiV PPM 05/08/12.  . Chronic systolic CHF (congestive heart failure) ([place])    a. EF 30-35% dx in 2011 -> most recent 40-45% by echo 04/2012. b. s/p upgrade to BiV-PPM 05/08/12. c. Med titration limited by hypotension.  . Cluster headache syndrome   . Hypertr obst cardiomyop   . Hypokalemia   . Hypotension   . Paroxysmal atrial fibrillation (HCC)    a. identified on PPM interrogation b. longest episode 1 hour; if burden increases, will need [place]   . Pneumonia 2008    Tobacco History: Social History   Tobacco Use  Smoking Status Former Smoker  . Packs/day: 0.10  . Years: 30.00  . Pack years: 3.00  . Last attempt to quit: 01/08/2010  . Years since quitting: 7.5  Smokeless Tobacco Never Used   Counseling given: Not Answered   Outpatient Medications Prior to Visit  Medication Sig Dispense Refill  . albuterol (PROVENTIL HFA;VENTOLIN HFA) 108 (90 Base) MCG/ACT inhaler Inhale 1-2 puffs into the lungs every 6 (six) hours as needed for wheezing or shortness of breath. 1 Inhaler 1  .  ALPRAZolam (XANAX) 0.25 MG tablet Take 1 tablet (0.25 mg total) by mouth 2 (two) times daily as needed for anxiety. 30 tablet 3  . AMBULATORY NON FORMULARY MEDICATION Medication Name: 100% O2 15 leters a min for 15-20 mins  Via Nasal cannual (Patient taking differently: Medication Name: 100% O2 15 leters a min for 15-20 mins as needed  Via Nasal cannual) 1 Bottle 2  . amitriptyline (ELAVIL) 10 MG tablet Take 10 mg by mouth as needed (headaches).     Marland Kitchen aspirin 81 MG chewable tablet Chew 81 mg by mouth daily.    . carvedilol (COREG) 6.25 MG tablet Take 1 tablet (6.25 mg total) by mouth 2 (two) times daily with a meal. 60 tablet 1  . cyclobenzaprine (FLEXERIL) 5 MG tablet TAKE 1 TABLET (5 MG TOTAL) BY MOUTH 3 (THREE) TIMES DAILY AS NEEDED FOR MUSCLE SPASMS. 30 tablet 2  . diclofenac sodium (VOLTAREN) 1 % GEL Apply 2 g topically 4 (four) times daily. (Patient taking differently: Apply 2 g topically 4 (four) times daily as needed (pain). ) 3 Tube 0  . ELIQUIS 5 MG TABS tablet TAKE 1 TABLET BY MOUTH TWICE DAILY 60 tablet 2  . FLUoxetine (PROZAC) 20 MG capsule TAKE 1 CAPSULE (20 MG TOTAL) BY MOUTH DAILY. 30 capsule 3  . fluticasone (FLONASE) 50 MCG/ACT nasal spray PLACE 2 SPRAYS INTO BOTH NOSTRILS DAILY. (Patient taking differently: Place 2 sprays into both nostrils daily as needed for allergies. ) 16 g 5  . loratadine (CLARITIN) 10 MG tablet Take 1 tablet (10 mg total) by mouth daily. 30 tablet 5  . montelukast (SINGULAIR) 10 MG tablet Take 1 tablet (10 mg total) by mouth at bedtime. 30 tablet 5  . nicotine (NICODERM CQ - DOSED IN MG/24 HOURS) 14 mg/24hr patch Place 1 patch (14 mg total) onto the skin daily. (Patient taking differently: Place 14 mg onto the skin daily as needed (nicotine withdrawal). ) 28 patch 0  . Spacer/Aero-Holding Chambers (AEROCHAMBER MV) inhaler Use as instructed with Symbicort 1 each 0  . SUMAtriptan (IMITREX) 50 MG tablet TAKE 1 TABLET AT ONSET OF HEADACHE. MAY REPEAT IN 2 HOURS IF  HEADACHE PERSISTS OR RECURS. (Patient taking differently: TAKE 1 TABLET (50MG ) AS NEEDED AT ONSET OF HEADACHE. MAY REPEAT IN 2 HOURS IF HEADACHE PERSISTS OR RECURS.) 10 tablet 6  . topiramate (TOPAMAX) 50 MG tablet Take 50 mg by mouth daily.    Marland Kitchen torsemide (DEMADEX) 20 MG tablet Take 1 tablet (20 mg total) by mouth 2 (two) times daily. (Patient taking differently: Take 40 mg by mouth 2 (two) times daily. ) 60 tablet 6  . traMADol (ULTRAM) 50 MG tablet Take 0.5 tablets (25 mg total) by mouth every 12 (twelve) hours as needed. 12 tablet 0  . ipratropium-albuterol (DUONEB) 0.5-2.5 (3) MG/3ML SOLN USE 1 VIAL (3ML) BY NEBULIZER EVERY 4 TO 6 HOURS AS NEEDED (Patient taking differently: Take 3 mLs by nebulization every 4 (four) hours as needed (shortness  of breath). ) 90 mL 0  . SYMBICORT 160-4.5 MCG/ACT inhaler INHALE 2 PUFFS BY MOUTH INTO THE LUNGS 2 TIMES A DAY 10.2 g 3  . Tiotropium Bromide Monohydrate (SPIRIVA RESPIMAT) 2.5 MCG/ACT AERS Inhale 2 puffs into the lungs daily. 1 Inhaler 0   No facility-administered medications prior to visit.     Review of Systems  Review of Systems  Constitutional: Negative.   HENT: Negative.   Respiratory: Negative.  Negative for shortness of breath.   Cardiovascular: Negative.   Skin: Negative.   Psychiatric/Behavioral: The patient is not nervous/anxious.     Physical Exam  BP 112/72 (BP Location: Left Arm, Cuff Size: Normal)   Pulse 70   Ht 5\' 1"  (1.549 m)   Wt 124 lb (56.2 kg)   LMP  (LMP Unknown)   SpO2 93%   BMI 23.43 kg/m  Physical Exam  Constitutional: She is oriented to person, place, and time. She appears well-developed and well-nourished.  HENT:  Head: Normocephalic and atraumatic.  Eyes: Pupils are equal, round, and reactive to light. EOM are normal.  Neck: Normal range of motion. Neck supple.  Cardiovascular: Normal rate, regular rhythm and normal heart sounds.  No murmur heard. No edema   Pulmonary/Chest: Effort normal and breath  sounds normal. No tachypnea. No respiratory distress. She has no wheezes.  Abdominal: Soft. Bowel sounds are normal. There is no tenderness.  Neurological: She is alert and oriented to person, place, and time.  Skin: Skin is warm and dry. No rash noted. No erythema.  Psychiatric: She has a normal mood and affect. Her behavior is normal. Judgment normal.      Wt Readings from Last 3 Encounters:  08/05/17 124 lb (56.2 kg)  07/29/17 125 lb (56.7 kg)  07/12/17 126 lb (57.2 kg)     Lab Results:  CBC    Component Value Date/Time   WBC 6.5 07/04/2017 1008   RBC 3.75 (L) 07/04/2017 1008   HGB 12.6 07/04/2017 1008   HCT 36.3 07/04/2017 1008   PLT 316.0 07/04/2017 1008   MCV 96.8 07/04/2017 1008   MCH 33.4 06/24/2017 0353   MCHC 34.8 07/04/2017 1008   RDW 14.6 07/04/2017 1008   LYMPHSABS 1.4 07/04/2017 1008   MONOABS 0.5 07/04/2017 1008   EOSABS 0.2 07/04/2017 1008   BASOSABS 0.1 07/04/2017 1008    BMET    Component Value Date/Time   NA 141 08/05/2017 1307   K 3.6 08/05/2017 1307   CL 103 08/05/2017 1307   CO2 27 08/05/2017 1307   GLUCOSE 109 (H) 08/05/2017 1307   BUN 21 08/05/2017 1307   CREATININE 1.35 (H) 08/05/2017 1307   CREATININE 1.30 (H) 03/15/2015 0955   CALCIUM 9.3 08/05/2017 1307   GFRNONAA 47 (L) 06/24/2017 0353   GFRAA 54 (L) 06/24/2017 0353    BNP    Component Value Date/Time   BNP 781.6 (H) 06/20/2017 1644   BNP 161.0 (H) 01/20/2015 1647    ProBNP    Component Value Date/Time   PROBNP 1,698.0 (H) 08/05/2017 1307    Imaging: Dg Chest 2 View  Result Date: 07/29/2017 CLINICAL DATA:  Dyspnea for 1 year. EXAM: CHEST - 2 VIEW COMPARISON:  CT chest 03/02/2017. PA and lateral chest 06/24/2017. Single-view of the chest 06/22/2017 and 10/16/2016. FINDINGS: The lungs are emphysematous. Calcified pleural plaques on the right are noted and better seen on prior CT. Chronic blunting of the right costophrenic angle is compatible with scar. Scarring is also  identified in  the right middle and lower lobes. Subsegmental atelectasis left lung base noted. There is cardiomegaly. Pacing device is in place. Atherosclerosis is noted. IMPRESSION: No acute disease. Cardiomegaly. Emphysema. Pleural and parenchymal scarring. Atherosclerosis. Electronically Signed   By: Inge Rise M.D.   On: 07/29/2017 11:05     Assessment & Plan:  67 year old female, history of COPD and systolic heart failure. Seen on 7/22 for nocturnal dyspnea. ProBNP 1131, patient instructed to take an additional 20 mg torsemide x4 days. Patient here for one-week follow-up, she feels well today. States that her breathing is better.  She has had no nocturnal dyspnea or episodes of shortness of breath.  Addition of Spiriva has appeared beneficial. Repeat labs showing increase in patients proBNP 1600. Clinically she appear improved during today's visit. I would not recommend diuresing patient further d/t hx hypotension and mildly elevated Cr 1.3. FU with cardiology.   Hypotension - Resolved; BP stable  - ARB and spirolactone discontinued during most recent hosp stay in June   Chronic combined systolic (congestive) and diastolic (congestive) heart failure ([place]) - Repeat ProBNP 1,698 (1,131), patient took additional 20mg  Torsemide x 4 days. Down 1 lbs. VSS.  - Clinically she appears better, no episodes of nocturnal dyspnea  - No change to current regimen, continue torsemide 20mg  BID  - FU with cardiology in sept   Pulmonary edema Stable; Most recent CXR 7/22 showing no acute disease   COPD (chronic obstructive pulmonary disease) ([place]) - Stable; continue Spiriva and Symbicort 160 - Feels her breathing has improved with additional of [place], NP 08/05/2017

## 2017-08-05 NOTE — Assessment & Plan Note (Signed)
-   Resolved; BP stable  - ARB and spirolactone discontinued during most recent hosp stay in June

## 2017-08-05 NOTE — Telephone Encounter (Signed)
Please call patient and let her know BNP was elevated, however, she clinically looked better in office today so I would not change anything. Cr was mildly elevated but not concerning. Continue Torsemide 20mg  twice daily. FU with Dr. Lamonte Sakai and cardiology as planned. Return as needed.

## 2017-08-05 NOTE — Assessment & Plan Note (Signed)
-   Stable; continue Spiriva and Symbicort 160 - Feels her breathing has improved with additional of spiriva

## 2017-08-05 NOTE — Assessment & Plan Note (Signed)
-   Repeat ProBNP 1,698 (1,131), patient took additional 20mg  Torsemide x 4 days. Down 1 lbs. VSS.  - Clinically she appears better, no episodes of nocturnal dyspnea  - No change to current regimen, continue torsemide 20mg  BID  - FU with cardiology in sept

## 2017-08-05 NOTE — Patient Instructions (Addendum)
Symbicort 2 puffs am and pm Spiriva 2 puffs am   No changes  Will call with labs results today

## 2017-08-07 MED FILL — ELIQUIS 5 MG TABLET: 5 | 30 days supply | Qty: 60 | Fill #1

## 2017-08-09 DIAGNOSIS — I5032 Chronic diastolic (congestive) heart failure: Secondary | ICD-10-CM | POA: Diagnosis not present

## 2017-08-12 ENCOUNTER — Telehealth: Payer: Self-pay | Admitting: Emergency Medicine

## 2017-08-12 MED FILL — MONTELUKAST SOD 10 MG TAB: 10 | 30 days supply | Qty: 30 | Fill #1

## 2017-08-12 MED FILL — ALPRAZolam 0.25 MG TABS: 0.25 | 15 days supply | Qty: 30 | Fill #3

## 2017-08-12 NOTE — Telephone Encounter (Signed)
Spoke with Caryl Pina with [employer]. He is going to fax over the pt's ONO to have [name] review. Per the report, the pt qualifies for night time oxygen. Riccardo Dubin that we will send over the order once [name] reviews the report and sees fit to order oxygen. Nothing further was needed.

## 2017-08-14 ENCOUNTER — Telehealth: Payer: Self-pay

## 2017-08-14 NOTE — Telephone Encounter (Signed)
Pt called stating she needs a cell adapter to send a transmission. I sent her one in the mail.

## 2017-08-16 ENCOUNTER — Ambulatory Visit (INDEPENDENT_AMBULATORY_CARE_PROVIDER_SITE_OTHER): Payer: 59 | Admitting: Emergency Medicine

## 2017-08-16 ENCOUNTER — Encounter: Payer: Self-pay | Admitting: Emergency Medicine

## 2017-08-16 DIAGNOSIS — G4734 Idiopathic sleep related nonobstructive alveolar hypoventilation: Secondary | ICD-10-CM | POA: Diagnosis not present

## 2017-08-16 DIAGNOSIS — J449 Chronic obstructive pulmonary disease, unspecified: Secondary | ICD-10-CM | POA: Diagnosis not present

## 2017-08-16 DIAGNOSIS — I5042 Chronic combined systolic (congestive) and diastolic (congestive) heart failure: Secondary | ICD-10-CM

## 2017-08-16 DIAGNOSIS — G4733 Obstructive sleep apnea (adult) (pediatric): Secondary | ICD-10-CM | POA: Diagnosis not present

## 2017-08-16 NOTE — Assessment & Plan Note (Signed)
Plan for home sleep study, probably either a CPAP titration study or empiric AutoSet CPAP depending on the results.  We will review in 2 months.

## 2017-08-16 NOTE — Assessment & Plan Note (Addendum)
Please work on stopping smoking altogether. Continue your Symbicort twice a day.  Remember to rinse and gargle after using. We will continue Spiriva Respimat 2 sprays once daily. Keep albuterol available to use 2 puffs if needed for shortness of breath, chest tightness, wheezing. Follow with Dr Lamonte Sakai in 2 months or sooner if you have any problems.

## 2017-08-16 NOTE — Patient Instructions (Signed)
We will proceed with your home sleep study as planned. Please work on stopping smoking altogether. Continue your Symbicort twice a day.  Remember to rinse and gargle after using. We will continue Spiriva Respimat 2 sprays once daily. Keep albuterol available to use 2 puffs if needed for shortness of breath, chest tightness, wheezing. Continue your torsemide as you are taking it. Follow-up with the advanced heart failure clinic to discuss your other blood pressure and diuretic medications.  They will likely want to adjust. Follow with Dr Lamonte Sakai in 2 months or sooner if you have any problems.

## 2017-08-16 NOTE — Progress Notes (Signed)
Subjective:    Patient ID: Angela Shepherd, female    DOB: May 14, 1950, 67 y.o.   MRN: [id number]  HPI  ROV 07/30/16 -- This follow-up visit for COPD. Also with a history of cough and mucus production. We changed her ACE inhibitor to an ARB with some benefit. He function testing was done on 07/04/16 and I have reviewed. This shows severe obstruction without a bronchodilator response. Lung volume are normal suggestive of possible coexisting restriction. Decreased diffusion capacity that corrects to the normal range when adjusted for alveolar volume. She is having no cough. She is benefiting from allergy regimen. She is active, exercises every day. She is on Symbicort. Very rare albuterol use.   ROV 04/12/17 --67 year old woman with a history of tobacco use, hypertrophic obstructive cardiomyopathy with pacemaker, hypertension with chronic systolic and diastolic CHF followed in the heart failure clinic.She has moderate to severe  obstruction on pulmonary function testing without a bronchodilator response.  Currently managed on Symbicort twice a day.  She has albuterol available and uses it approximately . She has had several admissions to [facility] for what sounds like flash pulm edema, with some associated panic. Some question of ? Bronchospasm. She has been diuresed and given BD's / steroids on the hospitalizations. No cough, no wheeze at these times. Her diuretics have been recently adjusted at the Heart Failure clinic.   ROV 08/15/17 --67 year old woman with history of tobacco use, associated moderate to severe obstructive lung disease/COPD.  She also has a hypertrophic obstructive cardiomyopathy (pacemaker), hypertension with chronic systolic and diastolic CHF followed in the CHF clinic.   She has been seen here several times since I last saw her. Her torsemide has ben adjusted since June, now back to her usual dosing. Follows with CHF clinic in Sept.  Also she had Spiriva added to her Symbicort 2 weeks ago.  She feels that it is probably helping her. She uses albuterol rarely. She is also planning for a home sleep study. Minimal smokingf - smokes about 2x a month.    Review of Systems  Constitutional: Negative.  Negative for fever and unexpected weight change.  HENT: Negative for congestion, dental problem, ear pain, nosebleeds, postnasal drip, rhinorrhea, sinus pressure, sneezing, sore throat and trouble swallowing.   Eyes: Negative.  Negative for redness and itching.  Respiratory: Negative.  Negative for cough, chest tightness, shortness of breath and wheezing.   Cardiovascular: Negative.  Negative for palpitations and leg swelling.  Gastrointestinal: Negative.  Negative for nausea and vomiting.  Endocrine: Negative.   Genitourinary: Negative.  Negative for dysuria.  Musculoskeletal: Negative.  Negative for joint swelling.  Skin: Negative.  Negative for rash.  Allergic/Immunologic: Negative.   Neurological: Negative for headaches.  Hematological: Negative.  Does not bruise/bleed easily.  Psychiatric/Behavioral: Negative.  Negative for dysphoric mood. The patient is not nervous/anxious.    Past Medical History:  Diagnosis Date  . Bradycardia    a. initial PPM placed 1994 at [facility] with gen change 2002. b. 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 Jude BiV PPM 05/08/12.  . Chronic systolic CHF (congestive heart failure) ([place])    a. EF 30-35% dx in 2011 -> most recent 40-45% by echo 04/2012. b. s/p upgrade to BiV-PPM 05/08/12. c. Med titration limited by hypotension.  . Cluster headache syndrome   . Hypertr obst cardiomyop   . Hypokalemia   . Hypotension   . Paroxysmal atrial fibrillation (HCC)    a. identified on PPM  interrogation b. longest episode 1 hour; if burden increases, will need Edgar   . Pneumonia 2008     Family History  Problem Relation Age of Onset  . Heart disease Sister   . Dementia Mother   . Cancer Sister 62       uterine  . Diabetes Unknown   .  Cardiomyopathy Sister   . Diabetes Brother   . Colon cancer Neg Hx   . Esophageal cancer Neg Hx   . Stomach cancer Neg Hx   . Rectal cancer Neg Hx      Social History   Socioeconomic History  . Marital status: Married    Spouse name: Not on file  . Number of children: 2  . Years of education: Not on file  . Highest education level: Not on file  Occupational History  . Occupation: [employer]    Employer: [employer]  . Financial resource strain: Not on file  . Food insecurity:    Worry: Not on file    Inability: Not on file  . Transportation needs:    Medical: Not on file    Non-medical: Not on file  Tobacco Use  . Smoking status: Former Smoker    Packs/day: 0.10    Years: 30.00    Pack years: 3.00    Last attempt to quit: 01/08/2010    Years since quitting: 7.6  . Smokeless tobacco: Never Used  Substance and Sexual Activity  . Alcohol use: Yes    Alcohol/week: 2.0 standard drinks    Types: 2 Glasses of wine per week  . Drug use: No  . Sexual activity: Never  Lifestyle  . Physical activity:    Days per week: Not on file    Minutes per session: Not on file  . Stress: Not on file  Relationships  . Social connections:    Talks on phone: Not on file    Gets together: Not on file    Attends religious service: Not on file    Active member of club or organization: Not on file    Attends meetings of clubs or organizations: Not on file    Relationship status: Not on file  . Intimate partner violence:    Fear of current or ex partner: Not on file    Emotionally abused: Not on file    Physically abused: Not on file    Forced sexual activity: Not on file  Other Topics Concern  . Not on file  Social History Narrative   ** Merged History Encounter **         Allergies  Allergen Reactions  . Prednisone Other (See Comments)    SOB, CHF      Outpatient Medications Prior to Visit  Medication Sig Dispense Refill  . albuterol (PROVENTIL  HFA;VENTOLIN HFA) 108 (90 Base) MCG/ACT inhaler Inhale 1-2 puffs into the lungs every 6 (six) hours as needed for wheezing or shortness of breath. 1 Inhaler 1  . ALPRAZolam (XANAX) 0.25 MG tablet Take 1 tablet (0.25 mg total) by mouth 2 (two) times daily as needed for anxiety. 30 tablet 3  . AMBULATORY NON FORMULARY MEDICATION Medication Name: 100% O2 15 leters a min for 15-20 mins  Via Nasal cannual (Patient taking differently: Medication Name: 100% O2 15 leters a min for 15-20 mins as needed  Via Nasal cannual) 1 Bottle 2  . amitriptyline (ELAVIL) 10 MG tablet Take 10 mg by mouth as needed (headaches).     Marland Kitchen  aspirin 81 MG chewable tablet Chew 81 mg by mouth daily.    . budesonide-formoterol (SYMBICORT) 160-4.5 MCG/ACT inhaler INHALE 2 PUFFS BY MOUTH INTO THE LUNGS 2 TIMES A DAY 3 Inhaler 1  . carvedilol (COREG) 6.25 MG tablet Take 1 tablet (6.25 mg total) by mouth 2 (two) times daily with a meal. 60 tablet 1  . cyclobenzaprine (FLEXERIL) 5 MG tablet TAKE 1 TABLET (5 MG TOTAL) BY MOUTH 3 (THREE) TIMES DAILY AS NEEDED FOR MUSCLE SPASMS. 30 tablet 2  . diclofenac sodium (VOLTAREN) 1 % GEL Apply 2 g topically 4 (four) times daily. (Patient taking differently: Apply 2 g topically 4 (four) times daily as needed (pain). ) 3 Tube 0  . ELIQUIS 5 MG TABS tablet TAKE 1 TABLET BY MOUTH TWICE DAILY 60 tablet 2  . FLUoxetine (PROZAC) 20 MG capsule TAKE 1 CAPSULE (20 MG TOTAL) BY MOUTH DAILY. 30 capsule 3  . fluticasone (FLONASE) 50 MCG/ACT nasal spray PLACE 2 SPRAYS INTO BOTH NOSTRILS DAILY. (Patient taking differently: Place 2 sprays into both nostrils daily as needed for allergies. ) 16 g 5  . ipratropium-albuterol (DUONEB) 0.5-2.5 (3) MG/3ML SOLN USE 1 VIAL (3ML) BY NEBULIZER EVERY 4 TO 6 HOURS AS NEEDED 270 mL 1  . loratadine (CLARITIN) 10 MG tablet Take 1 tablet (10 mg total) by mouth daily. 30 tablet 5  . montelukast (SINGULAIR) 10 MG tablet Take 1 tablet (10 mg total) by mouth at bedtime. 30 tablet 5  .  nicotine (NICODERM CQ - DOSED IN MG/24 HOURS) 14 mg/24hr patch Place 1 patch (14 mg total) onto the skin daily. (Patient taking differently: Place 14 mg onto the skin daily as needed (nicotine withdrawal). ) 28 patch 0  . Spacer/Aero-Holding Chambers (AEROCHAMBER MV) inhaler Use as instructed with Symbicort 1 each 0  . SUMAtriptan (IMITREX) 50 MG tablet TAKE 1 TABLET AT ONSET OF HEADACHE. MAY REPEAT IN 2 HOURS IF HEADACHE PERSISTS OR RECURS. (Patient taking differently: TAKE 1 TABLET (50MG ) AS NEEDED AT ONSET OF HEADACHE. MAY REPEAT IN 2 HOURS IF HEADACHE PERSISTS OR RECURS.) 10 tablet 6  . Tiotropium Bromide Monohydrate (SPIRIVA RESPIMAT) 2.5 MCG/ACT AERS Inhale 2 puffs into the lungs daily. 3 Inhaler 1  . topiramate (TOPAMAX) 50 MG tablet Take 50 mg by mouth daily.    Marland Kitchen torsemide (DEMADEX) 20 MG tablet Take 1 tablet (20 mg total) by mouth 2 (two) times daily. (Patient taking differently: Take 40 mg by mouth 2 (two) times daily. ) 60 tablet 6  . traMADol (ULTRAM) 50 MG tablet Take 0.5 tablets (25 mg total) by mouth every 12 (twelve) hours as needed. 12 tablet 0   No facility-administered medications prior to visit.          Objective:   Physical Exam Vitals:   08/16/17 0910  BP: 126/72  Pulse: (!) 104  SpO2: 99%  Weight: 122 lb 9.6 oz (55.6 kg)  Height: 5\' 2"  (1.575 m)   Gen: Pleasant, well-nourished, in no distress,  normal affect  ENT: No lesions,  mouth clear,  oropharynx clear, no nasal congestion  Neck: No JVD, no stridor  Lungs: No use of accessory muscles,  B insp and exp scattered wheeze.   Cardiovascular: RRR, heart sounds normal, no murmur or gallops, no peripheral edema  Musculoskeletal: No deformities, no cyanosis or clubbing  Neuro: alert, non focal  Skin: Warm, no lesions or rashes      Assessment & Plan:  COPD (chronic obstructive pulmonary disease) ([place]) Please work  on stopping smoking altogether. Continue your Symbicort twice a day.  Remember to rinse  and gargle after using. We will continue Spiriva Respimat 2 sprays once daily. Keep albuterol available to use 2 puffs if needed for shortness of breath, chest tightness, wheezing. Follow with Dr Lamonte Sakai in 2 months or sooner if you have any problems.  Chronic combined systolic (congestive) and diastolic (congestive) heart failure (HCC) Continue your torsemide as you are taking it. Follow-up with the advanced heart failure clinic to discuss your other blood pressure and diuretic medications.  They will likely want to adjust.   Nocturnal oxygen desaturation Plan for home sleep study, probably either a CPAP titration study or empiric AutoSet CPAP depending on the results.  We will review in 2 months.  Baltazar Apo, MD, PhD 08/16/2017, 9:55 AM [facility] [phone] or if no answer [phone]

## 2017-08-16 NOTE — Assessment & Plan Note (Signed)
Continue your torsemide as you are taking it. Follow-up with the advanced heart failure clinic to discuss your other blood pressure and diuretic medications.  They will likely want to adjust.

## 2017-08-18 DIAGNOSIS — J449 Chronic obstructive pulmonary disease, unspecified: Secondary | ICD-10-CM | POA: Diagnosis not present

## 2017-08-19 ENCOUNTER — Other Ambulatory Visit: Payer: Self-pay | Admitting: *Deleted

## 2017-08-19 ENCOUNTER — Ambulatory Visit (INDEPENDENT_AMBULATORY_CARE_PROVIDER_SITE_OTHER): Payer: 59 | Admitting: *Deleted

## 2017-08-19 DIAGNOSIS — R400 Somnolence: Secondary | ICD-10-CM

## 2017-08-19 DIAGNOSIS — I420 Dilated cardiomyopathy: Secondary | ICD-10-CM

## 2017-08-19 NOTE — Progress Notes (Signed)
Remote pacemaker transmission.   

## 2017-08-20 ENCOUNTER — Encounter: Payer: Self-pay | Admitting: Cardiology

## 2017-08-20 DIAGNOSIS — G4733 Obstructive sleep apnea (adult) (pediatric): Secondary | ICD-10-CM | POA: Diagnosis not present

## 2017-08-21 ENCOUNTER — Telehealth: Payer: Self-pay | Admitting: Primary Care

## 2017-08-21 DIAGNOSIS — G4733 Obstructive sleep apnea (adult) (pediatric): Secondary | ICD-10-CM

## 2017-08-21 NOTE — Telephone Encounter (Signed)
LMOM to call back re: results of sleep study

## 2017-08-21 NOTE — Telephone Encounter (Signed)
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  Spent 308 minutes of test time with SaO2 < 89%.  Probably needs in lab titration study.  Thanks.  Angela Shepherd

## 2017-08-21 NOTE — Telephone Encounter (Signed)
Please call patient and let her know sleep study showed moderate obstructive sleep apnea.   Also we need to arrange for in lab titration study for new cpap. Thanks

## 2017-08-22 NOTE — Telephone Encounter (Signed)
Spoke with pt, advised her sleep results from [place]. Pt understood and agreed to order CPAP titration. She is also wondering if her ONO came back and if we received the results. Please advise.   I called [facility] and held for 10 mins. [name] have you seen this?

## 2017-08-22 NOTE — Telephone Encounter (Signed)
Pt returned phone call; pt contact [phone]

## 2017-08-22 NOTE — Telephone Encounter (Signed)
Yes, thank you. I spoke with Dr. Lamonte Sakai. Hold off on ordering nocturnal oxygen for now. She needs titration study in lab for new OSA and they will decide if she needs oxygen with cpap use.

## 2017-08-22 NOTE — Telephone Encounter (Signed)
Called pt and advised message from the provider. Pt understood and verbalized understanding. Nothing further is needed.    

## 2017-08-23 ENCOUNTER — Telehealth: Payer: Self-pay | Admitting: Neurology

## 2017-08-23 MED ORDER — AMITRIPTYLINE HCL 10 MG PO TABS: 10.0000 mg | ORAL_TABLET | Freq: Every day | ORAL | 3 refills | Status: DC

## 2017-08-23 MED FILL — AMITRIPTYLINE HCL 10 MG TAB: 10 | 30 days supply | Qty: 30 | Fill #0

## 2017-08-23 NOTE — Telephone Encounter (Signed)
Patient called needing to get a refill on her Amitriptyline medication. Her PCP was prescribing it for her but now that she has seen Dr. Tomi Likens she would like to see if he can prescribe it now? She needs a 20 day supply and uses [facility]. Thanks

## 2017-08-23 NOTE — Telephone Encounter (Signed)
OK to refill

## 2017-08-26 ENCOUNTER — Other Ambulatory Visit (HOSPITAL_COMMUNITY): Payer: Self-pay | Admitting: Internal Medicine

## 2017-08-26 MED FILL — CARVEDILOL 12.5 MG TABLET: 12.5 | 30 days supply | Qty: 60 | Fill #0

## 2017-08-28 ENCOUNTER — Other Ambulatory Visit: Payer: Self-pay | Admitting: Family Medicine

## 2017-08-28 ENCOUNTER — Other Ambulatory Visit (HOSPITAL_COMMUNITY): Payer: Self-pay | Admitting: Internal Medicine

## 2017-08-28 MED ORDER — TORSEMIDE 20 MG PO TABS: 20.0000 mg | ORAL_TABLET | Freq: Two times a day (BID) | ORAL | 5 refills | Status: DC

## 2017-08-28 MED FILL — TORSEMIDE 20 MG TABLET: 20 | 30 days supply | Qty: 60 | Fill #0

## 2017-08-28 MED FILL — ALPRAZolam 0.25 MG TABS: 0.25 | 15 days supply | Qty: 30 | Fill #0

## 2017-08-28 MED FILL — FLUoxetine HCL 20 MG CAPS: 20 | 30 days supply | Qty: 30 | Fill #1

## 2017-08-28 NOTE — Telephone Encounter (Signed)
Last OV 06/12/17, Next OV 09/06/17  Last filled 03/25/17, # 30 with 3 refills

## 2017-09-06 ENCOUNTER — Encounter: Payer: 59 | Admitting: Family Medicine

## 2017-09-10 ENCOUNTER — Other Ambulatory Visit: Payer: Self-pay | Admitting: Emergency Medicine

## 2017-09-10 MED FILL — FLUTICASONE PROP 50 MCG SPR: 50 | 30 days supply | Qty: 16 | Fill #0

## 2017-09-10 MED FILL — ELIQUIS 5 MG TABLET: 5 | 30 days supply | Qty: 60 | Fill #2

## 2017-09-10 MED FILL — MONTELUKAST SOD 10 MG TAB: 10 | 30 days supply | Qty: 30 | Fill #2

## 2017-09-10 MED FILL — IPRAT-ALBUT 0.5-3(2.5) MG/3: 0.5-2.5 (3) | 15 days supply | Qty: 270 | Fill #1

## 2017-09-11 ENCOUNTER — Ambulatory Visit (INDEPENDENT_AMBULATORY_CARE_PROVIDER_SITE_OTHER): Payer: 59 | Admitting: Cardiology

## 2017-09-11 ENCOUNTER — Encounter: Payer: Self-pay | Admitting: Cardiology

## 2017-09-11 VITALS — BP 116/70 | HR 80 | Ht 62.0 in | Wt 123.0 lb

## 2017-09-11 DIAGNOSIS — I428 Other cardiomyopathies: Secondary | ICD-10-CM | POA: Diagnosis not present

## 2017-09-11 DIAGNOSIS — I480 Paroxysmal atrial fibrillation: Secondary | ICD-10-CM

## 2017-09-11 DIAGNOSIS — I421 Obstructive hypertrophic cardiomyopathy: Secondary | ICD-10-CM

## 2017-09-11 DIAGNOSIS — I10 Essential (primary) hypertension: Secondary | ICD-10-CM | POA: Diagnosis not present

## 2017-09-11 LAB — CUP PACEART INCLINIC DEVICE CHECK
Brady Statistic RA Percent Paced: 48 %
Date Time Interrogation Session: 20190904162244
Implantable Lead Implant Date: 19940705
Implantable Lead Implant Date: 19940705
Implantable Lead Location: 753858
Implantable Lead Location: 753860
Implantable Lead Model: 4024
Implantable Pulse Generator Implant Date: 20140501
Lead Channel Impedance Value: 400 Ohm
Lead Channel Pacing Threshold Amplitude: 0.75 V
Lead Channel Pacing Threshold Amplitude: 0.75 V
Lead Channel Pacing Threshold Pulse Width: 1 ms
Lead Channel Sensing Intrinsic Amplitude: 5 mV
Lead Channel Sensing Intrinsic Amplitude: 5.5 mV
Lead Channel Setting Pacing Pulse Width: 0.4 ms
Lead Channel Setting Sensing Sensitivity: 2 mV
MDC IDC LEAD IMPLANT DT: 20140501
MDC IDC LEAD LOCATION: 753859
MDC IDC MSMT BATTERY REMAINING LONGEVITY: 58 mo
MDC IDC MSMT BATTERY VOLTAGE: 2.92 V
MDC IDC MSMT LEADCHNL LV IMPEDANCE VALUE: 637.5 Ohm
MDC IDC MSMT LEADCHNL LV PACING THRESHOLD PULSEWIDTH: 1 ms
MDC IDC MSMT LEADCHNL RA IMPEDANCE VALUE: 425 Ohm
MDC IDC MSMT LEADCHNL RV PACING THRESHOLD AMPLITUDE: 1 V
MDC IDC MSMT LEADCHNL RV PACING THRESHOLD AMPLITUDE: 1 V
MDC IDC MSMT LEADCHNL RV PACING THRESHOLD PULSEWIDTH: 0.4 ms
MDC IDC MSMT LEADCHNL RV PACING THRESHOLD PULSEWIDTH: 0.4 ms
MDC IDC SET LEADCHNL LV PACING AMPLITUDE: 2.5 V
MDC IDC SET LEADCHNL LV PACING PULSEWIDTH: 1 ms
MDC IDC SET LEADCHNL RA PACING AMPLITUDE: 2 V
MDC IDC SET LEADCHNL RV PACING AMPLITUDE: 2 V
MDC IDC STAT BRADY RV PERCENT PACED: 93 %
Pulse Gen Model: 3210
Pulse Gen Serial Number: 2936617

## 2017-09-11 MED FILL — ALPRAZolam 0.25 MG TABS: 0.25 | 15 days supply | Qty: 30 | Fill #1

## 2017-09-11 NOTE — Patient Instructions (Addendum)
Medication Instructions:  Your physician recommends that you continue on your current medications as directed. Please refer to the Current Medication list given to you today.  * If you need a refill on your cardiac medications before your next appointment, please call your pharmacy.   Labwork: You will have pre procedure lab work at the hospital before your cardioversion.  Testing/Procedures: Your physician has recommended that you have a Cardioversion (DCCV). Electrical Cardioversion uses a jolt of electricity to your heart either through paddles or wired patches attached to your chest. This is a controlled, usually prescheduled, procedure. Defibrillation is done under light anesthesia in the hospital, and you usually go home the day of the procedure. This is done to get your heart back into a normal rhythm. You are not awake for the procedure. Please see the instruction sheet given to you today.  DCCV INSTRUCTIONS Please arrive at the [facility] main entrance of [facility] hospital on 09/19/2017 at 10:30 a.m.  Do not eat or drink after midnight prior to procedure Take your medications as normal the morning of your procedure with a sip of water. You will need someone to drive you home at discharge   Follow-Up: Your physician recommends that you schedule a follow-up appointment in: 1 month with Dr. Curt Bears in [facility]   Thank you for choosing [facility]!!   Trinidad Curet, RN [phone]  Any Other Special Instructions Will Be Listed Below (If Applicable).   Electrical Cardioversion Electrical cardioversion is the delivery of a jolt of electricity to restore a normal rhythm to the heart. A rhythm that is too fast or is not regular keeps the heart from pumping well. In this procedure, sticky patches or metal paddles are placed on the chest to deliver electricity to the heart from a device. This procedure may be done in an emergency if:  There is low or no blood pressure as a  result of the heart rhythm.  Normal rhythm must be restored as fast as possible to protect the brain and heart from further damage.  It may save a life.  This procedure may also be done for irregular or fast heart rhythms that are not immediately life-threatening. Tell a health care provider about:  Any allergies you have.  All medicines you are taking, including vitamins, herbs, eye drops, creams, and over-the-counter medicines.  Any problems you or family members have had with anesthetic medicines.  Any blood disorders you have.  Any surgeries you have had.  Any medical conditions you have.  Whether you are pregnant or may be pregnant. What are the risks? Generally, this is a safe procedure. However, problems may occur, including:  Allergic reactions to medicines.  A blood clot that breaks free and travels to other parts of your body.  The possible return of an abnormal heart rhythm within hours or days after the procedure.  Your heart stopping (cardiac arrest). This is rare.  What happens before the procedure? Medicines  Your health care provider may have you start taking: ? Blood-thinning medicines (anticoagulants) so your blood does not clot as easily. ? Medicines may be given to help stabilize your heart rate and rhythm.  Ask your health care provider about changing or stopping your regular medicines. This is especially important if you are taking diabetes medicines or blood thinners. General instructions  Plan to have someone take you home from the hospital or clinic.  If you will be going home right after the procedure, plan to have  someone with you for 24 hours.  Follow instructions from your health care provider about eating or drinking restrictions. What happens during the procedure?  To lower your risk of infection: ? Your health care team will wash or sanitize their hands. ? Your skin will be washed with soap.  An IV tube will be inserted into one  of your veins.  You will be given a medicine to help you relax (sedative).  Sticky patches (electrodes) or metal paddles may be placed on your chest.  An electrical shock will be delivered. The procedure may vary among health care providers and hospitals. What happens after the procedure?  Your blood pressure, heart rate, breathing rate, and blood oxygen level will be monitored until the medicines you were given have worn off.  Do not drive for 24 hours if you were given a sedative.  Your heart rhythm will be watched to make sure it does not change. This information is not intended to replace advice given to you by your health care provider. Make sure you discuss any questions you have with your health care provider. Document Released: 12/15/2001 Document Revised: 08/24/2015 Document Reviewed: 07/01/2015 Elsevier Interactive Patient Education  2017 [employer].

## 2017-09-11 NOTE — Progress Notes (Signed)
Electrophysiology Office Note   Date:  09/11/2017   ID:  Angela, Shepherd Mar 05, 1950, MRN [id number]  PCP:  Midge Minium, MD  Cardiologist:  [place] Primary Electrophysiologist:  Damari Hiltz Meredith Leeds, MD    No chief complaint on file.    History of Present Illness: Angela Shepherd is a 67 y.o. female who is being seen today for the evaluation of HOCM at the request of Tabori, Aundra Millet, MD. Presenting today for electrophysiology evaluation.  History of hypertrophic cardiomyopathy, bradycardia status post pacemaker in the 1990s at [facility], hypertension, tobacco abuse who quit in 2012.  She was admitted December 2011 with acute heart failure was found to have an ejection fraction of 30 to 35%.  Catheterization showed normal coronary arteries with an EF of 40 to 45%.  She was seen by EP and was found to have a high burden of RV pacing and was felt to be causing her LV dysfunction.  She underwent upgrade to a [employer] CRT-P 05/2012.  Echo in 2016 showed an EF of 45 to 50%.    Today, she denies symptoms of chest pain, shortness of breath, orthopnea, PND, lower extremity edema, claudication, dizziness, presyncope, syncope, bleeding, or neurologic sequela. The patient is tolerating medications without difficulties.  Overall feels well without chest pain.  She does not have much in the way of shortness of breath.  She does say that she has a strange feeling in her chest and has occasional palpitations.  She is a little bit more weak and fatigued than she ordinarily is.   Past Medical History:  Diagnosis Date  . Bradycardia    a. initial PPM placed 1994 at [facility] with gen change 2002. b. 1*AVB & intermittent 2nd degree AVB + CHF --> upgrade to [facility]. Jude BiV PPM 05/08/12.  . Chronic systolic CHF (congestive heart failure) ([place])    a. EF 30-35% dx in 2011 -> most recent 40-45% by echo 04/2012. b. s/p upgrade to BiV-PPM 05/08/12. c. Med titration limited by hypotension.  .  Cluster headache syndrome   . Hypertr obst cardiomyop   . Hypokalemia   . Hypotension   . Paroxysmal atrial fibrillation (HCC)    a. identified on PPM interrogation b. longest episode 1 hour; if burden increases, Dez Stauffer need [place]   . Pneumonia 2008   Past Surgical History:  Procedure Laterality Date  . BI-VENTRICULAR PACEMAKER UPGRADE N/A 05/08/2012   upgrade to [facility] (CRT-P) by Dr Rayann Heman  . PACEMAKER INSERTION  1994; 2002; 05/08/2012  . TONSILLECTOMY  ~ 1959  . TUBAL LIGATION  1984?     Current Outpatient Medications  Medication Sig Dispense Refill  . albuterol (PROVENTIL HFA;VENTOLIN HFA) 108 (90 Base) MCG/ACT inhaler Inhale 1-2 puffs into the lungs every 6 (six) hours as needed for wheezing or shortness of breath. 1 Inhaler 1  . ALPRAZolam (XANAX) 0.25 MG tablet TAKE 1 TABLET (0.25 MG TOTAL) BY MOUTH 2 (TWO) TIMES DAILY AS NEEDED FOR ANXIETY. 30 tablet 3  . AMBULATORY NON FORMULARY MEDICATION Medication Name: 100% O2 15 leters a min for 15-20 mins  Via Nasal cannual (Patient taking differently: Medication Name: 100% O2 15 leters a min for 15-20 mins as needed  Via Nasal cannual) 1 Bottle 2  . amitriptyline (ELAVIL) 10 MG tablet Take 10 mg by mouth as needed (headaches).     Marland Kitchen amitriptyline (ELAVIL) 10 MG tablet Take 1 tablet (10 mg total) by mouth at bedtime. 30 tablet 3  .  aspirin 81 MG chewable tablet Chew 81 mg by mouth daily.    . budesonide-formoterol (SYMBICORT) 160-4.5 MCG/ACT inhaler INHALE 2 PUFFS BY MOUTH INTO THE LUNGS 2 TIMES A DAY 3 Inhaler 1  . carvedilol (COREG) 12.5 MG tablet TAKE 1 TABLET BY MOUTH 2 TIMES DAILY WITH A MEAL. 60 tablet 3  . cyclobenzaprine (FLEXERIL) 5 MG tablet TAKE 1 TABLET (5 MG TOTAL) BY MOUTH 3 (THREE) TIMES DAILY AS NEEDED FOR MUSCLE SPASMS. 30 tablet 2  . diclofenac sodium (VOLTAREN) 1 % GEL Apply 2 g topically 4 (four) times daily. (Patient taking differently: Apply 2 g topically 4 (four) times daily as needed (pain). ) 3 Tube 0  . ELIQUIS 5 MG  TABS tablet TAKE 1 TABLET BY MOUTH TWICE DAILY 60 tablet 2  . FLUoxetine (PROZAC) 20 MG capsule TAKE 1 CAPSULE (20 MG TOTAL) BY MOUTH DAILY. 30 capsule 3  . fluticasone (FLONASE) 50 MCG/ACT nasal spray PLACE 2 SPRAYS INTO BOTH NOSTRILS DAILY. 16 g 5  . ipratropium-albuterol (DUONEB) 0.5-2.5 (3) MG/3ML SOLN USE 1 VIAL (3ML) BY NEBULIZER EVERY 4 TO 6 HOURS AS NEEDED 270 mL 1  . loratadine (CLARITIN) 10 MG tablet Take 1 tablet (10 mg total) by mouth daily. 30 tablet 5  . montelukast (SINGULAIR) 10 MG tablet Take 1 tablet (10 mg total) by mouth at bedtime. 30 tablet 5  . nicotine (NICODERM CQ - DOSED IN MG/24 HOURS) 14 mg/24hr patch Place 1 patch (14 mg total) onto the skin daily. (Patient taking differently: Place 14 mg onto the skin daily as needed (nicotine withdrawal). ) 28 patch 0  . Spacer/Aero-Holding Chambers (AEROCHAMBER MV) inhaler Use as instructed with Symbicort 1 each 0  . SUMAtriptan (IMITREX) 50 MG tablet TAKE 1 TABLET AT ONSET OF HEADACHE. MAY REPEAT IN 2 HOURS IF HEADACHE PERSISTS OR RECURS. (Patient taking differently: TAKE 1 TABLET (50MG ) AS NEEDED AT ONSET OF HEADACHE. MAY REPEAT IN 2 HOURS IF HEADACHE PERSISTS OR RECURS.) 10 tablet 6  . Tiotropium Bromide Monohydrate (SPIRIVA RESPIMAT) 2.5 MCG/ACT AERS Inhale 2 puffs into the lungs daily. 3 Inhaler 1  . topiramate (TOPAMAX) 50 MG tablet Take 50 mg by mouth daily.    Marland Kitchen torsemide (DEMADEX) 20 MG tablet Take 1 tablet (20 mg total) by mouth 2 (two) times daily. 60 tablet 5  . traMADol (ULTRAM) 50 MG tablet Take 0.5 tablets (25 mg total) by mouth every 12 (twelve) hours as needed. 12 tablet 0   No current facility-administered medications for this visit.     Allergies:   Prednisone   Social History:  The patient  reports that she quit smoking about 7 years ago. She has a 3.00 pack-year smoking history. She has never used smokeless tobacco. She reports that she drinks about 2.0 standard drinks of alcohol per week. She reports that she  does not use drugs.   Family History:  The patient's family history includes Cancer (age of onset: 29) in her sister; Cardiomyopathy in her sister; Dementia in her mother; Diabetes in her brother and unknown relative; Heart disease in her sister.    ROS:  Please see the history of present illness.   Otherwise, review of systems is positive for palpitations, fatigue.   All other systems are reviewed and negative.    PHYSICAL EXAM: VS:  Pulse 80   Ht 5\' 2"  (1.575 m)   Wt 123 lb (55.8 kg)   LMP  (LMP Unknown)   BMI 22.50 kg/m  , BMI Body mass  index is 22.5 kg/m. GEN: Well nourished, well developed, in no acute distress  HEENT: normal  Neck: no JVD, carotid bruits, or masses Cardiac: iRRR; no murmurs, rubs, or gallops,no edema  Respiratory:  clear to auscultation bilaterally, normal work of breathing GI: soft, nontender, nondistended, + BS MS: no deformity or atrophy  Skin: warm and dry, device pocket is well healed Neuro:  Strength and sensation are intact Psych: euthymic mood, full affect  EKG:  EKG is not ordered today. Personal review of the ekg ordered 06/21/17 shows atrial sensed, ventricular paced   Device interrogation is reviewed today in detail.  See PaceArt for details.   Recent Labs: 06/20/2017: B Natriuretic Peptide 781.6 06/24/2017: ALT 55; Magnesium 2.2 07/04/2017: Hemoglobin 12.6; Platelets 316.0 08/05/2017: BUN 21; Creatinine, Ser 1.35; Potassium 3.6; Pro B Natriuretic peptide (BNP) 1,698.0; Sodium 141    Lipid Panel     Component Value Date/Time   CHOL 179 06/21/2017 0143   TRIG 42 06/21/2017 0143   HDL 65 06/21/2017 0143   CHOLHDL 2.8 06/21/2017 0143   VLDL 8 06/21/2017 0143   LDLCALC 106 (H) 06/21/2017 0143   LDLDIRECT 149.0 06/02/2015 1351     Wt Readings from Last 3 Encounters:  09/11/17 123 lb (55.8 kg)  08/16/17 122 lb 9.6 oz (55.6 kg)  08/05/17 124 lb (56.2 kg)      Other studies Reviewed: Additional studies/ records that were reviewed  today include: TTE 06/21/17  Review of the above records today demonstrates:   - Left ventricle: Wall thickness was increased in a pattern of mild   LVH. Systolic function was moderately reduced. The estimated   ejection fraction was in the range of 35% to 40%. Severe   hypokinesis of the mid-apicalanteroseptal, lateral,   inferolateral, and apical myocardium. - Ventricular septum: Septal motion showed abnormal function and   dyssynergy. - Aortic valve: There was mild regurgitation. - Mitral valve: There was mild to moderate regurgitation. - Left atrium: The atrium was moderately dilated. - Pulmonary arteries: Systolic pressure was moderately increased.   PA peak pressure: 41 mm Hg (S).   ASSESSMENT AND PLAN:  1.  Systolic heart failure: Ejection fraction 40% status post CRT P upgrade 05/2012.  Recent echo shows an ejection fraction of 35 to 40%.  Currently on torsemide, Coreg, Aldactone, and losartan.  Pressure well controlled and no signs of volume overload.  No changes.  2.  Hypertrophic cardiomyopathy: Stable on most recent echo.  Per primary cardiology.  3.  Hypertension: Blood pressure well controlled today.  4.  Atrial fibrillation: Has been fairly consistent since late August.  Currently on Eliquis.  I am concerned that she is becoming persistent with her atrial fibrillation.  I feel that her atrial fibrillation Kaydi Kley likely, if it continues, put her into heart failure exacerbation.  We Laikyn Gewirtz thus plan for cardioversion.  Unfortunately, due to her systolic heart failure, she is limited in medication options.  She is also by V paced, but when correcting for her wide QRS, her QTC remains long.  Her only medication option is amiodarone.  I did discuss with her the possibility of ablation.  I Brick Ketcher see her back in 1 month for further discussions.  This patients CHA2DS2-VASc Score and unadjusted Ischemic Stroke Rate (% per year) is equal to 4.8 % stroke rate/year from a score of 4  Above  score calculated as 1 point each if present [CHF, HTN, DM, Vascular=MI/PAD/Aortic Plaque, Age if 65-74, or Female] Above score calculated as  2 points each if present [Age > 75, or Stroke/TIA/TE]  Case discussed with primary cardiology  Current medicines are reviewed at length with the patient today.   The patient does not have concerns regarding her medicines.  The following changes were made today:  none  Labs/ tests ordered today include:  No orders of the defined types were placed in this encounter.    Disposition:   FU with Maresha Anastos 1 month  Signed, Gavinn Collard Meredith Leeds, MD  09/11/2017 2:32 PM     [facility] [address] [place] [address] [phone] (office) [phone] (fax)

## 2017-09-12 LAB — CUP PACEART REMOTE DEVICE CHECK
Battery Remaining Longevity: 61 mo
Battery Remaining Percentage: 73 %
Brady Statistic AP VS Percent: 1 %
Date Time Interrogation Session: 20190812101440
Implantable Lead Implant Date: 19940705
Implantable Lead Implant Date: 19940705
Implantable Lead Location: 753858
Implantable Lead Location: 753860
Implantable Lead Model: 4024
Implantable Pulse Generator Implant Date: 20140501
Lead Channel Impedance Value: 430 Ohm
Lead Channel Impedance Value: 640 Ohm
Lead Channel Pacing Threshold Amplitude: 0.75 V
Lead Channel Pacing Threshold Amplitude: 1 V
Lead Channel Sensing Intrinsic Amplitude: 1.5 mV
Lead Channel Sensing Intrinsic Amplitude: 5 mV
Lead Channel Setting Pacing Amplitude: 2.5 V
Lead Channel Setting Pacing Pulse Width: 0.4 ms
Lead Channel Setting Sensing Sensitivity: 2 mV
MDC IDC LEAD IMPLANT DT: 20140501
MDC IDC LEAD LOCATION: 753859
MDC IDC MSMT BATTERY VOLTAGE: 2.92 V
MDC IDC MSMT LEADCHNL LV PACING THRESHOLD PULSEWIDTH: 1 ms
MDC IDC MSMT LEADCHNL RA PACING THRESHOLD PULSEWIDTH: 0.4 ms
MDC IDC MSMT LEADCHNL RV IMPEDANCE VALUE: 400 Ohm
MDC IDC MSMT LEADCHNL RV PACING THRESHOLD AMPLITUDE: 1.125 V
MDC IDC MSMT LEADCHNL RV PACING THRESHOLD PULSEWIDTH: 0.4 ms
MDC IDC SET LEADCHNL LV PACING PULSEWIDTH: 1 ms
MDC IDC SET LEADCHNL RA PACING AMPLITUDE: 2 V
MDC IDC SET LEADCHNL RV PACING AMPLITUDE: 2.125
MDC IDC STAT BRADY AP VP PERCENT: 59 %
MDC IDC STAT BRADY AS VP PERCENT: 41 %
MDC IDC STAT BRADY AS VS PERCENT: 1 %
MDC IDC STAT BRADY RA PERCENT PACED: 51 %
Pulse Gen Model: 3210
Pulse Gen Serial Number: 2936617

## 2017-09-17 ENCOUNTER — Telehealth: Payer: Self-pay | Admitting: Cardiology

## 2017-09-17 NOTE — Telephone Encounter (Signed)
° °  Patient wants additional information regarding getting pre procedure labs done.

## 2017-09-18 ENCOUNTER — Other Ambulatory Visit: Payer: Self-pay

## 2017-09-18 ENCOUNTER — Ambulatory Visit (HOSPITAL_COMMUNITY)
Admission: RE | Admit: 2017-09-18 | Discharge: 2017-09-18 | Disposition: A | Discharge status: Home / Self Care | Payer: 59 | Source: Ambulatory Visit | Attending: Internal Medicine | Admitting: Internal Medicine

## 2017-09-18 ENCOUNTER — Encounter (HOSPITAL_COMMUNITY): Payer: Self-pay | Admitting: Internal Medicine

## 2017-09-18 VITALS — BP 114/68 | HR 115 | Wt 123.2 lb

## 2017-09-18 DIAGNOSIS — Z7901 Long term (current) use of anticoagulants: Secondary | ICD-10-CM | POA: Insufficient documentation

## 2017-09-18 DIAGNOSIS — I10 Essential (primary) hypertension: Secondary | ICD-10-CM | POA: Diagnosis not present

## 2017-09-18 DIAGNOSIS — I5022 Chronic systolic (congestive) heart failure: Secondary | ICD-10-CM | POA: Diagnosis not present

## 2017-09-18 DIAGNOSIS — I480 Paroxysmal atrial fibrillation: Secondary | ICD-10-CM | POA: Diagnosis not present

## 2017-09-18 DIAGNOSIS — I481 Persistent atrial fibrillation: Secondary | ICD-10-CM | POA: Diagnosis not present

## 2017-09-18 DIAGNOSIS — R0789 Other chest pain: Secondary | ICD-10-CM | POA: Diagnosis not present

## 2017-09-18 DIAGNOSIS — I110 Hypertensive heart disease with heart failure: Secondary | ICD-10-CM | POA: Diagnosis not present

## 2017-09-18 DIAGNOSIS — Z87891 Personal history of nicotine dependence: Secondary | ICD-10-CM | POA: Insufficient documentation

## 2017-09-18 DIAGNOSIS — Z7951 Long term (current) use of inhaled steroids: Secondary | ICD-10-CM | POA: Diagnosis not present

## 2017-09-18 DIAGNOSIS — Z7982 Long term (current) use of aspirin: Secondary | ICD-10-CM | POA: Insufficient documentation

## 2017-09-18 DIAGNOSIS — I421 Obstructive hypertrophic cardiomyopathy: Secondary | ICD-10-CM | POA: Diagnosis not present

## 2017-09-18 DIAGNOSIS — I5032 Chronic diastolic (congestive) heart failure: Secondary | ICD-10-CM | POA: Diagnosis not present

## 2017-09-18 DIAGNOSIS — Z950 Presence of cardiac pacemaker: Secondary | ICD-10-CM | POA: Diagnosis not present

## 2017-09-18 DIAGNOSIS — J449 Chronic obstructive pulmonary disease, unspecified: Secondary | ICD-10-CM | POA: Diagnosis not present

## 2017-09-18 DIAGNOSIS — F419 Anxiety disorder, unspecified: Secondary | ICD-10-CM | POA: Diagnosis not present

## 2017-09-18 DIAGNOSIS — Z720 Tobacco use: Secondary | ICD-10-CM

## 2017-09-18 LAB — BASIC METABOLIC PANEL
Anion gap: 10 (ref 5–15)
BUN: 25 mg/dL — AB (ref 8–23)
CO2: 25 mmol/L (ref 22–32)
CREATININE: 1.15 mg/dL — AB (ref 0.44–1.00)
Calcium: 9.2 mg/dL (ref 8.9–10.3)
Chloride: 106 mmol/L (ref 98–111)
GFR calc Af Amer: 57 mL/min — ABNORMAL LOW (ref >60)
GFR, EST NON AFRICAN AMERICAN: 49 mL/min — AB (ref >60)
Glucose, Bld: 88 mg/dL (ref 70–99)
Potassium: 3.8 mmol/L (ref 3.5–5.1)
SODIUM: 141 mmol/L (ref 135–145)

## 2017-09-18 LAB — PROTIME-INR
INR: 1.06
Prothrombin Time: 13.7 seconds (ref 11.4–15.2)

## 2017-09-18 LAB — CBC
HCT: 44.6 % (ref 36.0–46.0)
Hemoglobin: 13.9 g/dL (ref 12.0–15.0)
MCH: 31.8 pg (ref 26.0–34.0)
MCHC: 31.2 g/dL (ref 30.0–36.0)
MCV: 102.1 fL — AB (ref 78.0–100.0)
PLATELETS: 204 10*3/uL (ref 150–400)
RBC: 4.37 MIL/uL (ref 3.87–5.11)
RDW: 14.1 % (ref 11.5–15.5)
WBC: 5.8 10*3/uL (ref 4.0–10.5)

## 2017-09-18 MED ORDER — CARVEDILOL 6.25 MG PO TABS: ORAL_TABLET | ORAL | 3 refills | Status: DC

## 2017-09-18 MED ORDER — LOSARTAN POTASSIUM 25 MG PO TABS: 25.0000 mg | ORAL_TABLET | Freq: Every day | ORAL | 11 refills | Status: DC

## 2017-09-18 NOTE — Patient Instructions (Signed)
Decrease Carvedilol to 6.25mg  twice daily.  Resume losartan 25mg  at bedtime.  **1st dose Saturday 09/21/17**  Follow up with Dr.Bensimhon in 4 weeks.

## 2017-09-18 NOTE — H&P (View-Only) (Signed)
Advanced Heart Failure Clinic Note   Patient ID: Angela Shepherd, female   DOB: 1950/05/07, 67 y.o.   MRN: [id number] PCP: Dr. Birdie Riddle  HPI: Angela Shepherd is a 67 y.o. 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 She has a h/o HOCM, bradycardia s/p PPM in the 90s at [facility]. PMHx also notable for HTN and prior tobacco abuse (quit 02/2010).   Was admitted in December 2011 with acute HF. Echo with EF 30-35%, There was also evidence of ASH with septum of 1.7 PW = 1.9. Diuresed and scheduled for outpt cath.  Cath 12/2009: Normal coronaries. EF 40-45%.  Central aortic pressure was 86/47 with a mean of 62.  LV pressure is 74/13 with an EDP of 2.  Right atrial pressure mean of 3.  RV pressure 26/3 with an EDP of 5.  PA pressure 28/5 with a mean of 16.  Pulmonary capillary wedge pressure mean of 7.  Fick cardiac output was 3.4 and cardiac index 2.1.   Echo in March 2012:  EF 50-55%. Grade 2 diastolic dysfunction. IVS 1.4cm.  Echo 4/14: EF 40-45%  ECHO 09/03/13: EF 40-45% Echo 4/16: EF 45-50%  Was seen by EP and noted to have a high percentage of RV pacing (99%) and it was felt this was worsening LV function. Underwent upgrade to [facility]. Jude CRT-P in 5/14.  Echo in 4/16 showed EF 45-50% with mild LVH.   Pt admitted to [facility] 08/2016 for CHF. Required BiPAP on admission but quickly weaned off O2. Pt denied medical or dietary non-compliance. Was told about Cardiomems device which she would like to pursue if she is a candidate.   Pt states she was admitted to [facility] again last week (09/20/16) and diuresed 8 lbs. No changes were made to her chronic medications.   Admitted in January to [facility] for volume overload (BNP 1400), and then in February for volume depletion requiring IVF. She states she was discharged 8 lbs up from her baseline weight.   Admitted in 6/19 for syncope with SBP 71/59. Angela Shepherd and losartan stopped.  Seen by [facility] in [facility] and found to be carrier for HOCM  .  She presents today for regular follow up. Following BP every day. SBP labile ranges 80-146. Mostly 100-110. Denies any dizziness. Weight stable 122-125. Feels pretty good. Still work FT for [employer] (work at home).  Recently saw Dr. Curt Bears and found to be back in AF. Plan for DC-CV tomorrow. Also failed home sleep study with AHI 16. With sats down to 82%. No edema. Walks on TM for 53mins at 30mph a little incline.     Echo 6/19 EF 35-40% Mild LVH no LVOT obstruction  Echo 08/22/16 At [facility]: LVEF 40-45%, mild MR, Mild TR  SH: Nonsmoker, works at [employer], lives in [place].   Review of systems complete and found to be negative unless listed in HPI.    Past Medical History:  Diagnosis Date  . Bradycardia    a. initial PPM placed 1994 at [facility] with gen change 2002. b. 1*AVB & intermittent 2nd degree AVB + CHF --> upgrade to [facility]. Jude BiV PPM 05/08/12.  . Chronic systolic CHF (congestive heart failure) ([place])    a. EF 30-35% dx in 2011 -> most recent 40-45% by echo 04/2012. b. s/p upgrade to BiV-PPM 05/08/12. c. Med titration limited by hypotension.  . Cluster headache syndrome   . Hypertr obst cardiomyop   . Hypokalemia   . Hypotension   .  Paroxysmal atrial fibrillation (HCC)    a. identified on PPM interrogation b. longest episode 1 hour; if burden increases, will need [place]   . Pneumonia 2008    Current Outpatient Medications  Medication Sig Dispense Refill  . albuterol (PROVENTIL HFA;VENTOLIN HFA) 108 (90 Base) MCG/ACT inhaler Inhale 1-2 puffs into the lungs every 6 (six) hours as needed for wheezing or shortness of breath. 1 Inhaler 1  . ALPRAZolam (XANAX) 0.25 MG tablet TAKE 1 TABLET (0.25 MG TOTAL) BY MOUTH 2 (TWO) TIMES DAILY AS NEEDED FOR ANXIETY. 30 tablet 3  . AMBULATORY NON FORMULARY MEDICATION Medication Name: 100% O2 15 leters a min for 15-20 mins  Via Nasal cannual (Patient taking differently: Medication Name: 100% O2 15 leters a min for 15-20 mins as needed for headaches  Via  Nasal cannual) 1 Bottle 2  . amitriptyline (ELAVIL) 10 MG tablet Take 1 tablet (10 mg total) by mouth at bedtime. (Patient taking differently: Take 10 mg by mouth daily as needed (heartburn). ) 30 tablet 3  . aspirin 81 MG chewable tablet Chew 81 mg by mouth daily.    . budesonide-formoterol (SYMBICORT) 160-4.5 MCG/ACT inhaler INHALE 2 PUFFS BY MOUTH INTO THE LUNGS 2 TIMES A DAY 3 Inhaler 1  . carvedilol (COREG) 12.5 MG tablet TAKE 1 TABLET BY MOUTH 2 TIMES DAILY WITH A MEAL. 60 tablet 3  . cyclobenzaprine (FLEXERIL) 5 MG tablet TAKE 1 TABLET (5 MG TOTAL) BY MOUTH 3 (THREE) TIMES DAILY AS NEEDED FOR MUSCLE SPASMS. 30 tablet 2  . diclofenac sodium (VOLTAREN) 1 % GEL Apply 2 g topically 4 (four) times daily. 3 Tube 0  . ELIQUIS 5 MG TABS tablet TAKE 1 TABLET BY MOUTH TWICE DAILY 60 tablet 2  . FLUoxetine (PROZAC) 20 MG capsule TAKE 1 CAPSULE (20 MG TOTAL) BY MOUTH DAILY. 30 capsule 3  . fluticasone (FLONASE) 50 MCG/ACT nasal spray PLACE 2 SPRAYS INTO BOTH NOSTRILS DAILY. 16 g 5  . ipratropium-albuterol (DUONEB) 0.5-2.5 (3) MG/3ML SOLN USE 1 VIAL (3ML) BY NEBULIZER EVERY 4 TO 6 HOURS AS NEEDED 270 mL 1  . loratadine (CLARITIN) 10 MG tablet Take 1 tablet (10 mg total) by mouth daily. 30 tablet 5  . montelukast (SINGULAIR) 10 MG tablet Take 1 tablet (10 mg total) by mouth at bedtime. 30 tablet 5  . nicotine (NICODERM CQ - DOSED IN MG/24 HOURS) 14 mg/24hr patch Place 1 patch (14 mg total) onto the skin daily. 28 patch 0  . potassium chloride SA (K-DUR,KLOR-CON) 20 MEQ tablet Take 20 mEq by mouth daily.    Marland Kitchen Spacer/Aero-Holding Chambers (AEROCHAMBER MV) inhaler Use as instructed with Symbicort 1 each 0  . SUMAtriptan (IMITREX) 50 MG tablet TAKE 1 TABLET AT ONSET OF HEADACHE. MAY REPEAT IN 2 HOURS IF HEADACHE PERSISTS OR RECURS. 10 tablet 6  . Tiotropium Bromide Monohydrate (SPIRIVA RESPIMAT) 2.5 MCG/ACT AERS Inhale 2 puffs into the lungs daily. 3 Inhaler 1  . topiramate (TOPAMAX) 50 MG tablet Take 50 mg  by mouth daily as needed.     . torsemide (DEMADEX) 20 MG tablet Take 1 tablet (20 mg total) by mouth 2 (two) times daily. 60 tablet 5  . traMADol (ULTRAM) 50 MG tablet Take 0.5 tablets (25 mg total) by mouth every 12 (twelve) hours as needed. 12 tablet 0   No current facility-administered medications for this encounter.    PHYSICAL EXAM: Vitals:   09/18/17 1434  BP: 114/68  Pulse: (!) 115  SpO2: 97%  Weight: 55.9  kg (123 lb 4 oz)    Wt Readings from Last 3 Encounters:  09/18/17 55.9 kg (123 lb 4 oz)  09/11/17 55.8 kg (123 lb)  08/16/17 55.6 kg (122 lb 9.6 oz)    Physical Exam.  General:  Well appearing. No resp difficulty HEENT: normal Neck: supple. no JVD. Carotids 2+ bilat; no bruits. No lymphadenopathy or thryomegaly appreciated. Cor: PMI nondisplaced. Regular rate & rhythm. No rubs, gallops or murmurs. Lungs: clear with decreased BS throughout  Abdomen: soft, nontender, nondistended. No hepatosplenomegaly. No bruits or masses. Good bowel sounds. Extremities: no cyanosis, clubbing, rash, edema Neuro: alert & orientedx3, cranial nerves grossly intact. moves all 4 extremities w/o difficulty. Affect pleasant   ASSESSMENT & PLAN: 1. Chronic systolic CHF: EF 53% s/p CRT-P upgrade 05/2012 (due to chronic RV pacing)  - Echo 1/19 At [facility]: LVEF 35-40%, mild MR, Mild TR - Overall stable NYHA II - Volume status ok - Continue torsemide 20 mg BID - Recently admitted for symptomatic hypotension and losartan and spiro stopped - Will cut carvedilol back to 6.25 bid and add back losartan 25mg  qhs as BP tolerates .BP too low for Entresto,  - Continue to hold spiro for now.  - Reinforced fluid restriction to < 2 L daily, sodium restriction to less than 2000 mg daily, and the importance of daily weights.   2. H/o HOCM  - Stable on most recent Echo. No LVOT gradient - Has been [facility] in [facility]. All 1st degree relatives have been tested 3. HTN - BP recently low with  syncope. Med adjustments as above  4. Tobacco use quit 2012 - She is smoking a bit again. Encouraged to quit - PFTs relatively stable 06/2016. 5. Atypical chest pain - No further. Suspect GI etiology - Asked pt to follow up with any repeat chest pain.  6. Anxiety - improved. Per PCP 7. Persistent AF - Dr. Curt Bears planning DC-CV tomorrow followed by RFA - Continue Eliquis    Glori Bickers, MD  09/18/2017

## 2017-09-18 NOTE — Progress Notes (Signed)
Advanced Heart Failure Clinic Note   Patient ID: Angela Shepherd, female   DOB: [birth year], 67 y.o.   MRN: [id number] PCP: Dr. Birdie Riddle  HPI: Angela Shepherd is a 67 y.o. 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 PMHx also notable for HTN and prior tobacco abuse (quit 02/2010).   Was admitted in December 2011 with acute HF. Echo with EF 30-35%, There was also evidence of ASH with septum of 1.7 PW = 1.9. Diuresed and scheduled for outpt cath.  Cath 12/2009: Normal coronaries. EF 40-45%.  Central aortic pressure was 86/47 with a mean of 62.  LV pressure is 74/13 with an EDP of 2.  Right atrial pressure mean of 3.  RV pressure 26/3 with an EDP of 5.  PA pressure 28/5 with a mean of 16.  Pulmonary capillary wedge pressure mean of 7.  Fick cardiac output was 3.4 and cardiac index 2.1.   Echo in March 2012:  EF 50-55%. Grade 2 diastolic dysfunction. IVS 1.4cm.  Echo 4/14: EF 40-45%  ECHO 09/03/13: EF 40-45% Echo 4/16: EF 45-50%  Was seen by EP and noted to have a high percentage of RV pacing (99%) and it was felt this was worsening LV function. Underwent upgrade to [facility]. Jude CRT-P in 5/14.  Echo in 4/16 showed EF 45-50% with mild LVH.   Pt admitted to [facility] 08/2016 for CHF. Required BiPAP on admission but quickly weaned off O2. Pt denied medical or dietary non-compliance. Was told about Cardiomems device which she would like to pursue if she is a candidate.   Pt states she was admitted to [facility] again last week (09/20/16) and diuresed 8 lbs. No changes were made to her chronic medications.   Admitted in January to [facility] for volume overload (BNP 1400), and then in February for volume depletion requiring IVF. She states she was discharged 8 lbs up from her baseline weight.   Admitted in 6/19 for syncope with SBP 71/59. Arlyce Harman and losartan stopped.  Seen by [facility] in [facility] and found to be carrier for HOCM  .  She presents today for regular follow up. Following BP every day. SBP labile ranges 80-146. Mostly 100-110. Denies any dizziness. Weight stable 122-125. Feels pretty good. Still work FT for [employer] (work at home).  Recently saw Dr. Curt Bears and found to be back in AF. Plan for DC-CV tomorrow. Also failed home sleep study with AHI 16. With sats down to 82%. No edema. Walks on TM for 83mins at 15mph a little incline.     Echo 6/19 EF 35-40% Mild LVH no LVOT obstruction  Echo 08/22/16 At [facility]: LVEF 40-45%, mild MR, Mild TR  SH: Nonsmoker, works at [employer], lives in [place].   Review of systems complete and found to be negative unless listed in HPI.    Past Medical History:  Diagnosis Date  . Bradycardia    a. initial PPM placed 1994 at [facility] with gen change 2002. b. 1*AVB & intermittent 2nd degree AVB + CHF --> upgrade to [facility]. Jude BiV PPM 05/08/12.  . Chronic systolic CHF (congestive heart failure) ([place])    a. EF 30-35% dx in 2011 -> most recent 40-45% by echo 04/2012. b. s/p upgrade to BiV-PPM 05/08/12. c. Med titration limited by hypotension.  . Cluster headache syndrome   . Hypertr obst cardiomyop   . Hypokalemia   . Hypotension   .  Paroxysmal atrial fibrillation (HCC)    a. identified on PPM interrogation b. longest episode 1 hour; if burden increases, will need [place]   . Pneumonia 2008    Current Outpatient Medications  Medication Sig Dispense Refill  . albuterol (PROVENTIL HFA;VENTOLIN HFA) 108 (90 Base) MCG/ACT inhaler Inhale 1-2 puffs into the lungs every 6 (six) hours as needed for wheezing or shortness of breath. 1 Inhaler 1  . ALPRAZolam (XANAX) 0.25 MG tablet TAKE 1 TABLET (0.25 MG TOTAL) BY MOUTH 2 (TWO) TIMES DAILY AS NEEDED FOR ANXIETY. 30 tablet 3  . AMBULATORY NON FORMULARY MEDICATION Medication Name: 100% O2 15 leters a min for 15-20 mins  Via Nasal cannual (Patient taking differently: Medication Name: 100% O2 15 leters a min for 15-20 mins as needed for headaches  Via  Nasal cannual) 1 Bottle 2  . amitriptyline (ELAVIL) 10 MG tablet Take 1 tablet (10 mg total) by mouth at bedtime. (Patient taking differently: Take 10 mg by mouth daily as needed (heartburn). ) 30 tablet 3  . aspirin 81 MG chewable tablet Chew 81 mg by mouth daily.    . budesonide-formoterol (SYMBICORT) 160-4.5 MCG/ACT inhaler INHALE 2 PUFFS BY MOUTH INTO THE LUNGS 2 TIMES A DAY 3 Inhaler 1  . carvedilol (COREG) 12.5 MG tablet TAKE 1 TABLET BY MOUTH 2 TIMES DAILY WITH A MEAL. 60 tablet 3  . cyclobenzaprine (FLEXERIL) 5 MG tablet TAKE 1 TABLET (5 MG TOTAL) BY MOUTH 3 (THREE) TIMES DAILY AS NEEDED FOR MUSCLE SPASMS. 30 tablet 2  . diclofenac sodium (VOLTAREN) 1 % GEL Apply 2 g topically 4 (four) times daily. 3 Tube 0  . ELIQUIS 5 MG TABS tablet TAKE 1 TABLET BY MOUTH TWICE DAILY 60 tablet 2  . FLUoxetine (PROZAC) 20 MG capsule TAKE 1 CAPSULE (20 MG TOTAL) BY MOUTH DAILY. 30 capsule 3  . fluticasone (FLONASE) 50 MCG/ACT nasal spray PLACE 2 SPRAYS INTO BOTH NOSTRILS DAILY. 16 g 5  . ipratropium-albuterol (DUONEB) 0.5-2.5 (3) MG/3ML SOLN USE 1 VIAL (3ML) BY NEBULIZER EVERY 4 TO 6 HOURS AS NEEDED 270 mL 1  . loratadine (CLARITIN) 10 MG tablet Take 1 tablet (10 mg total) by mouth daily. 30 tablet 5  . montelukast (SINGULAIR) 10 MG tablet Take 1 tablet (10 mg total) by mouth at bedtime. 30 tablet 5  . nicotine (NICODERM CQ - DOSED IN MG/24 HOURS) 14 mg/24hr patch Place 1 patch (14 mg total) onto the skin daily. 28 patch 0  . potassium chloride SA (K-DUR,KLOR-CON) 20 MEQ tablet Take 20 mEq by mouth daily.    Marland Kitchen Spacer/Aero-Holding Chambers (AEROCHAMBER MV) inhaler Use as instructed with Symbicort 1 each 0  . SUMAtriptan (IMITREX) 50 MG tablet TAKE 1 TABLET AT ONSET OF HEADACHE. MAY REPEAT IN 2 HOURS IF HEADACHE PERSISTS OR RECURS. 10 tablet 6  . Tiotropium Bromide Monohydrate (SPIRIVA RESPIMAT) 2.5 MCG/ACT AERS Inhale 2 puffs into the lungs daily. 3 Inhaler 1  . topiramate (TOPAMAX) 50 MG tablet Take 50 mg  by mouth daily as needed.     . torsemide (DEMADEX) 20 MG tablet Take 1 tablet (20 mg total) by mouth 2 (two) times daily. 60 tablet 5  . traMADol (ULTRAM) 50 MG tablet Take 0.5 tablets (25 mg total) by mouth every 12 (twelve) hours as needed. 12 tablet 0   No current facility-administered medications for this encounter.    PHYSICAL EXAM: Vitals:   09/18/17 1434  BP: 114/68  Pulse: (!) 115  SpO2: 97%  Weight: 55.9  kg (123 lb 4 oz)    Wt Readings from Last 3 Encounters:  09/18/17 55.9 kg (123 lb 4 oz)  09/11/17 55.8 kg (123 lb)  08/16/17 55.6 kg (122 lb 9.6 oz)    Physical Exam.  General:  Well appearing. No resp difficulty HEENT: normal Neck: supple. no JVD. Carotids 2+ bilat; no bruits. No lymphadenopathy or thryomegaly appreciated. Cor: PMI nondisplaced. Regular rate & rhythm. No rubs, gallops or murmurs. Lungs: clear with decreased BS throughout  Abdomen: soft, nontender, nondistended. No hepatosplenomegaly. No bruits or masses. Good bowel sounds. Extremities: no cyanosis, clubbing, rash, edema Neuro: alert & orientedx3, cranial nerves grossly intact. moves all 4 extremities w/o difficulty. Affect pleasant   ASSESSMENT & PLAN: 1. Chronic systolic CHF: EF 02% s/p CRT-P upgrade 05/2012 (due to chronic RV pacing)  - Echo 1/19 At [facility]: LVEF 35-40%, mild MR, Mild TR - Overall stable NYHA II - Volume status ok - Continue torsemide 20 mg BID - Recently admitted for symptomatic hypotension and losartan and spiro stopped - Will cut carvedilol back to 6.25 bid and add back losartan 25mg  qhs as BP tolerates .BP too low for Entresto,  - Continue to hold spiro for now.  - Reinforced fluid restriction to < 2 L daily, sodium restriction to less than 2000 mg daily, and the importance of daily weights.   2. H/o HOCM  - Stable on most recent Echo. No LVOT gradient - Has been seen in [facility]. All 1st degree relatives have been tested 3. HTN - BP recently low with  syncope. Med adjustments as above  4. Tobacco use quit 2012 - She is smoking a bit again. Encouraged to quit - PFTs relatively stable 06/2016. 5. Atypical chest pain - No further. Suspect GI etiology - Asked pt to follow up with any repeat chest pain.  6. Anxiety - improved. Per PCP 7. Persistent AF - Dr. Curt Bears planning DC-CV tomorrow followed by RFA - Continue Eliquis    Glori Bickers, MD  09/18/2017

## 2017-09-19 ENCOUNTER — Encounter (HOSPITAL_COMMUNITY): Payer: Self-pay

## 2017-09-19 ENCOUNTER — Other Ambulatory Visit: Payer: Self-pay

## 2017-09-19 ENCOUNTER — Ambulatory Visit (HOSPITAL_COMMUNITY): Payer: 59 | Admitting: Anesthesiology

## 2017-09-19 ENCOUNTER — Encounter (HOSPITAL_COMMUNITY)
Admission: RE | Disposition: A | Discharge status: Home / Self Care | Payer: Self-pay | Source: Ambulatory Visit | Attending: Cardiovascular Disease

## 2017-09-19 ENCOUNTER — Ambulatory Visit (HOSPITAL_COMMUNITY)
Admission: RE | Admit: 2017-09-19 | Discharge: 2017-09-19 | Disposition: A | Discharge status: Home / Self Care | Payer: 59 | Source: Ambulatory Visit | Attending: Cardiovascular Disease | Admitting: Cardiovascular Disease

## 2017-09-19 DIAGNOSIS — R0789 Other chest pain: Secondary | ICD-10-CM | POA: Diagnosis not present

## 2017-09-19 DIAGNOSIS — J449 Chronic obstructive pulmonary disease, unspecified: Secondary | ICD-10-CM | POA: Diagnosis not present

## 2017-09-19 DIAGNOSIS — M810 Age-related osteoporosis without current pathological fracture: Secondary | ICD-10-CM | POA: Insufficient documentation

## 2017-09-19 DIAGNOSIS — Z7951 Long term (current) use of inhaled steroids: Secondary | ICD-10-CM | POA: Diagnosis not present

## 2017-09-19 DIAGNOSIS — F419 Anxiety disorder, unspecified: Secondary | ICD-10-CM | POA: Insufficient documentation

## 2017-09-19 DIAGNOSIS — I421 Obstructive hypertrophic cardiomyopathy: Secondary | ICD-10-CM | POA: Insufficient documentation

## 2017-09-19 DIAGNOSIS — I5042 Chronic combined systolic (congestive) and diastolic (congestive) heart failure: Secondary | ICD-10-CM | POA: Diagnosis not present

## 2017-09-19 DIAGNOSIS — Z950 Presence of cardiac pacemaker: Secondary | ICD-10-CM | POA: Diagnosis not present

## 2017-09-19 DIAGNOSIS — Z87891 Personal history of nicotine dependence: Secondary | ICD-10-CM | POA: Diagnosis not present

## 2017-09-19 DIAGNOSIS — I481 Persistent atrial fibrillation: Secondary | ICD-10-CM | POA: Insufficient documentation

## 2017-09-19 DIAGNOSIS — Z79899 Other long term (current) drug therapy: Secondary | ICD-10-CM | POA: Insufficient documentation

## 2017-09-19 DIAGNOSIS — Z7982 Long term (current) use of aspirin: Secondary | ICD-10-CM | POA: Insufficient documentation

## 2017-09-19 DIAGNOSIS — N183 Chronic kidney disease, stage 3 (moderate): Secondary | ICD-10-CM | POA: Insufficient documentation

## 2017-09-19 DIAGNOSIS — I130 Hypertensive heart and chronic kidney disease with heart failure and stage 1 through stage 4 chronic kidney disease, or unspecified chronic kidney disease: Secondary | ICD-10-CM | POA: Insufficient documentation

## 2017-09-19 DIAGNOSIS — Z7901 Long term (current) use of anticoagulants: Secondary | ICD-10-CM | POA: Insufficient documentation

## 2017-09-19 DIAGNOSIS — I4891 Unspecified atrial fibrillation: Secondary | ICD-10-CM | POA: Diagnosis not present

## 2017-09-19 DIAGNOSIS — I4819 Other persistent atrial fibrillation: Secondary | ICD-10-CM

## 2017-09-19 HISTORY — DX: Sleep apnea, unspecified: G47.30

## 2017-09-19 HISTORY — PX: CARDIOVERSION: SHX1299

## 2017-09-19 SURGERY — CARDIOVERSION
Anesthesia: General

## 2017-09-19 MED ORDER — LIDOCAINE HCL (CARDIAC) PF 100 MG/5ML IV SOSY
PREFILLED_SYRINGE | INTRAVENOUS | Status: DC | PRN
  Administered 2017-09-19: 60 mg via INTRAVENOUS

## 2017-09-19 MED ORDER — SODIUM CHLORIDE 0.9 % IV SOLN
INTRAVENOUS | Status: DC
  Administered 2017-09-19 (×2): via INTRAVENOUS

## 2017-09-19 MED ORDER — PROPOFOL 10 MG/ML IV BOLUS
INTRAVENOUS | Status: DC | PRN
  Administered 2017-09-19: 90 mg via INTRAVENOUS

## 2017-09-19 NOTE — Discharge Instructions (Signed)
Electrical Cardioversion, Care After °This sheet gives you information about how to care for yourself after your procedure. Your health care provider may also give you more specific instructions. If you have problems or questions, contact your health care provider. °What can I expect after the procedure? °After the procedure, it is common to have: °· Some redness on the skin where the shocks were given. ° °Follow these instructions at home: °· Do not drive for 24 hours if you were given a medicine to help you relax (sedative). °· Take over-the-counter and prescription medicines only as told by your health care provider. °· Ask your health care provider how to check your pulse. Check it often. °· Rest for 48 hours after the procedure or as told by your health care provider. °· Avoid or limit your caffeine use as told by your health care provider. °Contact a health care provider if: °· You feel like your heart is beating too quickly or your pulse is not regular. °· You have a serious muscle cramp that does not go away. °Get help right away if: °· You have discomfort in your chest. °· You are dizzy or you feel faint. °· You have trouble breathing or you are short of breath. °· Your speech is slurred. °· You have trouble moving an arm or leg on one side of your body. °· Your fingers or toes turn cold or blue. °This information is not intended to replace advice given to you by your health care provider. Make sure you discuss any questions you have with your health care provider. °Document Released: 10/15/2012 Document Revised: 07/29/2015 Document Reviewed: 07/01/2015 °Elsevier Interactive Patient Education © 2018 Elsevier Inc. ° °

## 2017-09-19 NOTE — Anesthesia Procedure Notes (Signed)
Procedure Name: MAC Date/Time: 09/19/2017 10:53 AM Performed by: Teressa Lower., CRNA Pre-anesthesia Checklist: Patient identified, Emergency Drugs available, Suction available, Patient being monitored and Timeout performed Patient Re-evaluated:Patient Re-evaluated prior to induction Oxygen Delivery Method: Nasal cannula

## 2017-09-19 NOTE — Transfer of Care (Signed)
Immediate Anesthesia Transfer of Care Note  Patient: Angela Shepherd  Procedure(s) Performed: CARDIOVERSION (N/A )  Patient Location: Endoscopy Unit  Anesthesia Type:General  Level of Consciousness: awake, alert  and oriented  Airway & Oxygen Therapy: Patient Spontanous Breathing and Patient connected to nasal cannula oxygen  Post-op Assessment: Report given to RN and Post -op Vital signs reviewed and stable  Post vital signs: Reviewed and stable  Last Vitals:  Vitals Value Taken Time  BP    Temp    Pulse    Resp    SpO2      Last Pain:  Vitals:   09/19/17 1002  TempSrc: Oral  PainSc: 0-No pain         Complications: No apparent anesthesia complications

## 2017-09-19 NOTE — Interval H&P Note (Signed)
History and Physical Interval Note:  09/19/2017 9:55 AM  Angela Shepherd  has presented today for surgery, with the diagnosis of afib  The various methods of treatment have been discussed with the patient and family. After consideration of risks, benefits and other options for treatment, the patient has consented to  Procedure(s): CARDIOVERSION (N/A) as a surgical intervention .  The patient's history has been reviewed, patient examined, no change in status, stable for surgery.  I have reviewed the patient's chart and labs.  Questions were answered to the patient's satisfaction.     Amarrion Pastorino

## 2017-09-19 NOTE — Op Note (Signed)
Procedure: Electrical Cardioversion Indications:  Atrial Fibrillation  Procedure Details:  Consent: Risks of procedure as well as the alternatives and risks of each were explained to the (patient/caregiver).  Consent for procedure obtained.  Time Out: Verified patient identification, verified procedure, site/side was marked, verified correct patient position, special equipment/implants available, medications/allergies/relevent history reviewed, required imaging and test results available.  Performed  Patient placed on cardiac monitor, pulse oximetry, supplemental oxygen as necessary.  Sedation given: Propofol 90 mg IV, Dr. Fransisco Beau Pacer pads placed anterior and posterior chest.  Cardioverted 1 time(s).  Cardioversion with synchronized biphasic 120J shock.  Evaluation: Findings: Post procedure EKG shows: Atrial paced, biventricular paced rhythm Complications: None Patient did tolerate procedure well. CRT-P device check normal after the cardioversion.  Time Spent Directly with the Patient:  45 minutes   Angela Shepherd 09/19/2017, 10:57 AM

## 2017-09-19 NOTE — Anesthesia Preprocedure Evaluation (Addendum)
Anesthesia Evaluation  Patient identified by MRN, date of birth, ID band Patient awake    Reviewed: Allergy & Precautions, NPO status , Patient's Chart, lab work & pertinent test results  History of Anesthesia Complications Negative for: history of anesthetic complications  Airway Mallampati: II  TM Distance: >3 FB Neck ROM: Full    Dental  (+) Dental Advisory Given   Pulmonary COPD, former smoker,    breath sounds clear to auscultation       Cardiovascular hypertension, Pt. on home beta blockers and Pt. on medications +CHF  + dysrhythmias Atrial Fibrillation + pacemaker  Rhythm:Irregular Rate:Normal   '19 TTE - Mild LVH. EF 35% to 40%. Severe hypokinesis of the mid-apicalanteroseptal, lateral, inferolateral, and apical myocardium. Ventricular septal motion showed abnormal function and dyssynergy. Mild AI, mild-moderate MR. LA moderately dilated.  PASP was moderately increased. PA peak pressure: 41 mm Hg  EKG - A-sensed, V-paced rhythm    Neuro/Psych  Headaches, Anxiety    GI/Hepatic negative GI ROS, Neg liver ROS,   Endo/Other  negative endocrine ROS  Renal/GU Renal InsufficiencyRenal disease  negative genitourinary   Musculoskeletal negative musculoskeletal ROS (+)   Abdominal   Peds  Hematology negative hematology ROS (+)   Anesthesia Other Findings   Reproductive/Obstetrics                            Anesthesia Physical Anesthesia Plan  ASA: III  Anesthesia Plan: General   Post-op Pain Management:    Induction: Intravenous  PONV Risk Score and Plan: 3 and Treatment may vary due to age or medical condition and Propofol infusion  Airway Management Planned: Mask and Natural Airway  Additional Equipment: None  Intra-op Plan:   Post-operative Plan:   Informed Consent: I have reviewed the patients History and Physical, chart, labs and discussed the procedure including the  risks, benefits and alternatives for the proposed anesthesia with the patient or authorized representative who has indicated his/her understanding and acceptance.   Dental advisory given  Plan Discussed with: CRNA and Anesthesiologist  Anesthesia Plan Comments:        Anesthesia Quick Evaluation

## 2017-09-19 NOTE — Anesthesia Postprocedure Evaluation (Signed)
Anesthesia Post Note  Patient: [name]  Procedure(s) Performed: CARDIOVERSION (N/A )     Patient location during evaluation: PACU Anesthesia Type: General Level of consciousness: awake and alert Pain management: pain level controlled Vital Signs Assessment: post-procedure vital signs reviewed and stable Respiratory status: spontaneous breathing, nonlabored ventilation and respiratory function stable Cardiovascular status: blood pressure returned to baseline and stable Postop Assessment: no apparent nausea or vomiting Anesthetic complications: no    Last Vitals:  Vitals:   09/19/17 1110 09/19/17 1115  BP: 95/64 101/62  Pulse: 69 68  Resp: 17 16  Temp:    SpO2: 96% 96%    Last Pain:  Vitals:   09/19/17 1110  TempSrc:   PainSc: 0-No pain                 Audry Pili

## 2017-09-24 ENCOUNTER — Ambulatory Visit (HOSPITAL_BASED_OUTPATIENT_CLINIC_OR_DEPARTMENT_OTHER): Payer: 59 | Attending: Primary Care | Admitting: Pulmonary Disease

## 2017-09-24 VITALS — Ht 62.0 in | Wt 118.0 lb

## 2017-09-24 DIAGNOSIS — J449 Chronic obstructive pulmonary disease, unspecified: Secondary | ICD-10-CM | POA: Insufficient documentation

## 2017-09-24 DIAGNOSIS — G4731 Primary central sleep apnea: Secondary | ICD-10-CM | POA: Diagnosis not present

## 2017-09-24 DIAGNOSIS — I272 Pulmonary hypertension, unspecified: Secondary | ICD-10-CM | POA: Insufficient documentation

## 2017-09-24 DIAGNOSIS — G4739 Other sleep apnea: Secondary | ICD-10-CM

## 2017-09-24 DIAGNOSIS — I5042 Chronic combined systolic (congestive) and diastolic (congestive) heart failure: Secondary | ICD-10-CM | POA: Diagnosis not present

## 2017-09-24 DIAGNOSIS — G4733 Obstructive sleep apnea (adult) (pediatric): Secondary | ICD-10-CM | POA: Diagnosis not present

## 2017-09-25 MED FILL — CARVEDILOL 6.25 MG TABLET: 6.25 | 90 days supply | Qty: 180 | Fill #0

## 2017-09-25 MED FILL — CARVEDILOL 12.5 MG TABLET: 12.5 | 30 days supply | Qty: 60 | Fill #1

## 2017-09-25 MED FILL — LOSARTAN POTASSIUM 25 MG TA: 25 | 90 days supply | Qty: 90 | Fill #0

## 2017-09-26 MED FILL — TORSEMIDE 20 MG TABLET: 20 | 30 days supply | Qty: 60 | Fill #1

## 2017-09-26 MED FILL — FLUoxetine HCL 20 MG CAPS: 20 | 30 days supply | Qty: 30 | Fill #2

## 2017-09-29 DIAGNOSIS — G4733 Obstructive sleep apnea (adult) (pediatric): Secondary | ICD-10-CM | POA: Diagnosis not present

## 2017-09-29 NOTE — Procedures (Signed)
   Patient Name: Angela Shepherd, Leth Date: 09/24/2017   Gender: Female  D.O.B: 03-14-1950  Age (years): 77  Referring Provider: Geraldo Pitter NP  Height (inches): 64  Interpreting Physician: [name] MD, ABSM  Weight (lbs): 118  RPSGT: Carolin Coy  BMI: 22  MRN: [id number]  Neck Size: 13.50  <br> <br>  CLINICAL INFORMATION  The patient has a history of COPD, chronic combined congestive heart failure, and pulmonary hypertension. She had a home sleep study on 08/16/17 that showed moderate obstructive sleep apnea with an AHI of 16.9 and SpO2 low of 82%. She spent 308.3 minutes with an SpO2 < 89%. She presents for a CPAP titration study. SLEEP STUDY TECHNIQUE  As per the AASM Manual for the Scoring of Sleep and Associated Events v2.3 (April 2016) with a hypopnea requiring 4% desaturations. The channels recorded and monitored were frontal, central and occipital EEG, electrooculogram (EOG), submentalis EMG (chin), nasal and oral airflow, thoracic and abdominal wall motion, anterior tibialis EMG, snore microphone, electrocardiogram, and pulse oximetry. Bilevel positive airway pressure (BPAP) was initiated at the beginning of the study and titrated to treat sleep-disordered breathing. MEDICATIONS  Medications self-administered by patient taken the night of the study : N/A RESPIRATORY PARAMETERS  Optimal CPAP Pressure (cm):  8  AHI at Optimal Pressure (/hr) 1.8  Overall Minimal O2 (%): 91.0 Minimal O2 at Optimal Pressure (%): 94.0   She was started on CPAP and had control of sleep apnea with CPAP of 8 cm H2O. She was tried on higher pressures of CPAP and developed central apneas. These were not improved with transition to Bipap. She did not require supplemental oxygen during this study.  SLEEP ARCHITECTURE  Start Time: 10:26:43 PM Stop Time: 5:20:33 AM Total Time (min): 413.8 Total Sleep Time (min): 320.5  Sleep Latency (min): 50.2 Sleep Efficiency (%): 77.4% REM Latency (min):  183.5 WASO (min): 43.1  Stage N1 (%): 23.9% Stage N2 (%): 61.5% Stage N3 (%): 0.0% Stage R (%): 14.7  Supine (%): 73.49 Arousal Index (/hr): 14.6          CARDIAC DATA  The 2 lead EKG demonstrated sinus rhythm. The mean heart rate was 64.2 beats per minute. Other EKG findings include: PVCs.  LEG MOVEMENT DATA  The total Periodic Limb Movements of Sleep (PLMS) were 0. The PLMS index was 0.0. A PLMS index of <15 is considered normal in adults. IMPRESSIONS  - She did well with CPAP 8 cm H2O. At this pressure her AHI was reduced to 1.8. She was observed in REM and supine sleep at this pressure setting. - She developed central apneas at higher CPAP settings and this was not improved with trial of Bipap. - She did not require the use of supplemental oxygen during this study. DIAGNOSIS  - Obstructive Sleep Apnea  - Treatment Emergent Central Sleep Apnea RECOMMENDATIONS  - Trial of CPAP therapy on 8 cm H2O with a Small size Resmed Full Face Mask AirFit F10 for Her mask and heated humidification. [Electronically signed] 09/29/2017 06:06 PM [name] MD, [place], American Board of Sleep Medicine  NPI: [id number]

## 2017-09-30 ENCOUNTER — Other Ambulatory Visit: Payer: Self-pay

## 2017-09-30 ENCOUNTER — Telehealth: Payer: Self-pay | Admitting: Primary Care

## 2017-09-30 DIAGNOSIS — G4733 Obstructive sleep apnea (adult) (pediatric): Secondary | ICD-10-CM

## 2017-09-30 NOTE — Telephone Encounter (Signed)
I spoke with patient to make her aware for sleep study results and see is agreeing to CPAP trial   Please refer to DME for new CPAP start, setting 8cm H20 with small size Resmed full face mask airfit F10 with heated humidification and supplies. She does not need supplemental oxygen. Please call patient to let her know and will need FU visit with Dr. Halford Chessman or myself for CPAP review with download in 6-8 weeks.

## 2017-10-09 ENCOUNTER — Telehealth: Payer: Self-pay | Admitting: Emergency Medicine

## 2017-10-09 ENCOUNTER — Other Ambulatory Visit (HOSPITAL_COMMUNITY): Payer: Self-pay | Admitting: Adult Health

## 2017-10-09 ENCOUNTER — Other Ambulatory Visit: Payer: Self-pay | Admitting: Emergency Medicine

## 2017-10-09 MED FILL — VENTOLIN HFA 90 MCG INHALER: 108 (90 BAS | 25 days supply | Qty: 18 | Fill #0

## 2017-10-09 NOTE — Telephone Encounter (Signed)
Called and spoke with pt who stated last OV with Angela Barrow, NP she had been sent down for a cxr as well as labwork.   Stated to pt after looking at last OV with RB on 08/16/17, it does not say anything in here about needing any labs or an xray.  Stated to pt if RB decided he wanted any labs or an xray to be performed, we could send her down after the OV with him.  Pt expressed understanding. Nothing further needed.

## 2017-10-10 MED FILL — POTASSIUM CL ER 20 MEQ TAB: 20 | 90 days supply | Qty: 90 | Fill #0

## 2017-10-11 ENCOUNTER — Encounter: Payer: Self-pay | Admitting: Emergency Medicine

## 2017-10-11 ENCOUNTER — Ambulatory Visit (INDEPENDENT_AMBULATORY_CARE_PROVIDER_SITE_OTHER): Payer: 59 | Admitting: Emergency Medicine

## 2017-10-11 DIAGNOSIS — J449 Chronic obstructive pulmonary disease, unspecified: Secondary | ICD-10-CM | POA: Diagnosis not present

## 2017-10-11 DIAGNOSIS — I4819 Other persistent atrial fibrillation: Secondary | ICD-10-CM

## 2017-10-11 DIAGNOSIS — Z720 Tobacco use: Secondary | ICD-10-CM | POA: Diagnosis not present

## 2017-10-11 DIAGNOSIS — G4733 Obstructive sleep apnea (adult) (pediatric): Secondary | ICD-10-CM | POA: Insufficient documentation

## 2017-10-11 NOTE — Assessment & Plan Note (Signed)
Please continue your Spiriva and Symbicort as you have been taking them. Keep your albuterol available to use 2 puffs or 1 nebulizer treatment up to every 4 hours if needed for shortness of breath Get your flu shot later this month as planned. Pneumonia vaccination up-to-date. Continue to work hard on stopping your smoking. Follow with Dr Lamonte Sakai in 3 months or sooner if you have any problems

## 2017-10-11 NOTE — Assessment & Plan Note (Signed)
Currently in sinus rhythm since her cardioversion last month.

## 2017-10-11 NOTE — Assessment & Plan Note (Signed)
Continues to smoke about 3 cigarettes a week.  I talked to her about stopping.

## 2017-10-11 NOTE — Progress Notes (Signed)
Subjective:    Patient ID: Angela Shepherd, female    DOB: 16-Oct-1950, 67 y.o.   MRN: [id number]  HPI  ROV 08/15/17 --67 year old woman with history of tobacco use, associated moderate to severe obstructive lung disease/COPD.  She also has a hypertrophic obstructive cardiomyopathy (pacemaker), hypertension with chronic systolic and diastolic CHF followed in the CHF clinic.   She has been seen here several times since I last saw her. Her torsemide has ben adjusted since June, now back to her usual dosing. Follows with CHF clinic in Sept.  Also she had Spiriva added to her Symbicort 2 weeks ago. She feels that it is probably helping her. She uses albuterol rarely. She is also planning for a home sleep study. Minimal smokingf - smokes about 2x a month.   ROV 10/11/17 --follow-up visit for tobacco use and COPD.  She also has a history of HOCM, hypertension and chronic systolic and diastolic CHF.  Since last time we performed a CPAP titration based on her home sleep data.  She was adequately treated with a CPAP of 8 cm water, small ResMed full face mask. She remains on Symbicort and Spiriva. She has not needed albuterol frequently. She has a bit more cough lately, dry, no mucous.  She is back on losartan, managed by Dr Haroldine Laws, coreg adjusted also. Successful cardioversion about a month ago.  Gets her flu shot later this month.  She has not smoked since Saturday. She is averaging 3 cig a week.    Review of Systems  Constitutional: Negative.  Negative for fever and unexpected weight change.  HENT: Negative for congestion, dental problem, ear pain, nosebleeds, postnasal drip, rhinorrhea, sinus pressure, sneezing, sore throat and trouble swallowing.   Eyes: Negative.  Negative for redness and itching.  Respiratory: Negative.  Negative for cough, chest tightness, shortness of breath and wheezing.   Cardiovascular: Negative.  Negative for palpitations and leg swelling.  Gastrointestinal: Negative.   Negative for nausea and vomiting.  Endocrine: Negative.   Genitourinary: Negative.  Negative for dysuria.  Musculoskeletal: Negative.  Negative for joint swelling.  Skin: Negative.  Negative for rash.  Allergic/Immunologic: Negative.   Neurological: Negative for headaches.  Hematological: Negative.  Does not bruise/bleed easily.  Psychiatric/Behavioral: Negative.  Negative for dysphoric mood. The patient is not nervous/anxious.    Past Medical History:  Diagnosis Date  . Bradycardia    a. initial PPM placed [facility] at [facility] with gen change 2002. b. 1*AVB & intermittent 2nd degree AVB + CHF --> upgrade to [facility]. Jude BiV PPM 05/08/12.  . Chronic systolic CHF (congestive heart failure) ([place])    a. EF 30-35% dx in 2011 -> most recent 40-45% by echo 04/2012. b. s/p upgrade to BiV-PPM 05/08/12. c. Med titration limited by hypotension.  . Cluster headache syndrome   . Hypertr obst cardiomyop   . Hypokalemia   . Hypotension   . Paroxysmal atrial fibrillation (HCC)    a. identified on PPM interrogation b. longest episode 1 hour; if burden increases, will need [place]   . Pneumonia 2008  . Sleep apnea      Family History  Problem Relation Age of Onset  . Heart disease Sister   . Dementia Mother   . Cancer Sister 80       uterine  . Diabetes Unknown   . Cardiomyopathy Sister   . Diabetes Brother   . Colon cancer Neg Hx   . Esophageal cancer Neg Hx   . Stomach cancer Neg  Hx   . Rectal cancer Neg Hx      Social History   Socioeconomic History  . Marital status: Widowed    Spouse name: Not on file  . Number of children: 2  . Years of education: Not on file  . Highest education level: Not on file  Occupational History  . Occupation: [employer]    Employer: [employer]  . Financial resource strain: Not on file  . Food insecurity:    Worry: Not on file    Inability: Not on file  . Transportation needs:    Medical: Not on file    Non-medical: Not on file    Tobacco Use  . Smoking status: Former Smoker    Packs/day: 0.10    Years: 30.00    Pack years: 3.00    Last attempt to quit: 01/08/2010    Years since quitting: 7.7  . Smokeless tobacco: Never Used  Substance and Sexual Activity  . Alcohol use: Yes    Alcohol/week: 2.0 standard drinks    Types: 2 Glasses of wine per week  . Drug use: No  . Sexual activity: Never  Lifestyle  . Physical activity:    Days per week: Not on file    Minutes per session: Not on file  . Stress: Not on file  Relationships  . Social connections:    Talks on phone: Not on file    Gets together: Not on file    Attends religious service: Not on file    Active member of club or organization: Not on file    Attends meetings of clubs or organizations: Not on file    Relationship status: Not on file  . Intimate partner violence:    Fear of current or ex partner: Not on file    Emotionally abused: Not on file    Physically abused: Not on file    Forced sexual activity: Not on file  Other Topics Concern  . Not on file  Social History Narrative   ** Merged History Encounter **         Allergies  Allergen Reactions  . Prednisone Shortness Of Breath    CHF      Outpatient Medications Prior to Visit  Medication Sig Dispense Refill  . ALPRAZolam (XANAX) 0.25 MG tablet TAKE 1 TABLET (0.25 MG TOTAL) BY MOUTH 2 (TWO) TIMES DAILY AS NEEDED FOR ANXIETY. 30 tablet 3  . AMBULATORY NON FORMULARY MEDICATION Medication Name: 100% O2 15 leters a min for 15-20 mins  Via Nasal cannual (Patient taking differently: Medication Name: 100% O2 15 leters a min for 15-20 mins as needed for headaches  Via Nasal cannual) 1 Bottle 2  . amitriptyline (ELAVIL) 10 MG tablet Take 1 tablet (10 mg total) by mouth at bedtime. (Patient taking differently: Take 10 mg by mouth daily as needed (heartburn). ) 30 tablet 3  . aspirin 81 MG chewable tablet Chew 81 mg by mouth daily.    . budesonide-formoterol (SYMBICORT) 160-4.5 MCG/ACT inhaler  INHALE 2 PUFFS BY MOUTH INTO THE LUNGS 2 TIMES A DAY 3 Inhaler 1  . carvedilol (COREG) 6.25 MG tablet TAKE 1 TABLET BY MOUTH 2 TIMES DAILY WITH A MEAL. 60 tablet 3  . cyclobenzaprine (FLEXERIL) 5 MG tablet TAKE 1 TABLET (5 MG TOTAL) BY MOUTH 3 (THREE) TIMES DAILY AS NEEDED FOR MUSCLE SPASMS. 30 tablet 2  . diclofenac sodium (VOLTAREN) 1 % GEL Apply 2 g topically 4 (four)  times daily. 3 Tube 0  . ELIQUIS 5 MG TABS tablet TAKE 1 TABLET BY MOUTH TWICE DAILY 60 tablet 2  . FLUoxetine (PROZAC) 20 MG capsule TAKE 1 CAPSULE (20 MG TOTAL) BY MOUTH DAILY. 30 capsule 3  . fluticasone (FLONASE) 50 MCG/ACT nasal spray PLACE 2 SPRAYS INTO BOTH NOSTRILS DAILY. 16 g 5  . ipratropium-albuterol (DUONEB) 0.5-2.5 (3) MG/3ML SOLN USE 1 VIAL (3ML) BY NEBULIZER EVERY 4 TO 6 HOURS AS NEEDED 270 mL 1  . loratadine (CLARITIN) 10 MG tablet Take 1 tablet (10 mg total) by mouth daily. 30 tablet 5  . losartan (COZAAR) 25 MG tablet Take 1 tablet (25 mg total) by mouth daily. 30 tablet 11  . montelukast (SINGULAIR) 10 MG tablet Take 1 tablet (10 mg total) by mouth at bedtime. 30 tablet 5  . nicotine (NICODERM CQ - DOSED IN MG/24 HOURS) 14 mg/24hr patch Place 1 patch (14 mg total) onto the skin daily. 28 patch 0  . potassium chloride SA (K-DUR,KLOR-CON) 20 MEQ tablet Take 20 mEq by mouth daily.    . potassium chloride SA (K-DUR,KLOR-CON) 20 MEQ tablet TAKE 1 TABLET (20 MEQ TOTAL) BY MOUTH DAILY. 90 tablet 1  . Spacer/Aero-Holding Chambers (AEROCHAMBER MV) inhaler Use as instructed with Symbicort 1 each 0  . SUMAtriptan (IMITREX) 50 MG tablet TAKE 1 TABLET AT ONSET OF HEADACHE. MAY REPEAT IN 2 HOURS IF HEADACHE PERSISTS OR RECURS. 10 tablet 6  . Tiotropium Bromide Monohydrate (SPIRIVA RESPIMAT) 2.5 MCG/ACT AERS Inhale 2 puffs into the lungs daily. 3 Inhaler 1  . topiramate (TOPAMAX) 50 MG tablet Take 50 mg by mouth daily as needed.     . torsemide (DEMADEX) 20 MG tablet Take 1 tablet (20 mg total) by mouth 2 (two) times daily. 60  tablet 5  . traMADol (ULTRAM) 50 MG tablet Take 0.5 tablets (25 mg total) by mouth every 12 (twelve) hours as needed. 12 tablet 0  . VENTOLIN HFA 108 (90 Base) MCG/ACT inhaler INHALE 1-2 PUFFS INTO THE LUNGS EVERY 6 (SIX) HOURS AS NEEDED FOR WHEEZING OR SHORTNESS OF BREATH. 1 Inhaler 5   No facility-administered medications prior to visit.          Objective:   Physical Exam Vitals:   10/11/17 1353  BP: 116/74  Pulse: 78  SpO2: 98%  Weight: 122 lb (55.3 kg)  Height: 5\' 2"  (1.575 m)   Gen: Pleasant, well-nourished, in no distress,  normal affect  ENT: No lesions,  mouth clear,  oropharynx clear, no nasal congestion  Neck: No JVD, no stridor  Lungs: No use of accessory muscles, very distant but clear  Cardiovascular: RRR, heart sounds normal, no murmur or gallops, no peripheral edema  Musculoskeletal: No deformities, no cyanosis or clubbing  Neuro: alert, non focal  Skin: Warm, no lesions or rashes      Assessment & Plan:  COPD (chronic obstructive pulmonary disease) (HCC) Please continue your Spiriva and Symbicort as you have been taking them. Keep your albuterol available to use 2 puffs or 1 nebulizer treatment up to every 4 hours if needed for shortness of breath Get your flu shot later this month as planned. Pneumonia vaccination up-to-date. Continue to work hard on stopping your smoking. Follow with Dr Lamonte Sakai in 3 months or sooner if you have any problems  Persistent atrial fibrillation (HCC) Currently in sinus rhythm since her cardioversion last month.  OSA (obstructive sleep apnea) Documented OSA, preparing to start CPAP 8 cm water.  Tobacco use Continues  to smoke about 3 cigarettes a week.  I talked to her about stopping.  Baltazar Apo, MD, PhD 10/11/2017, 2:18 PM [facility] [phone] or if no answer [phone]

## 2017-10-11 NOTE — Patient Instructions (Addendum)
Please continue your Spiriva and Symbicort as you have been taking them. Keep your albuterol available to use 2 puffs or 1 nebulizer treatment up to every 4 hours if needed for shortness of breath Agree with starting CPAP every night, 8 cm water pressure. Get your flu shot later this month as planned. Pneumonia vaccination up-to-date. Continue to work hard on stopping your smoking. Follow with Dr Lamonte Sakai in 3 months or sooner if you have any problems

## 2017-10-11 NOTE — Assessment & Plan Note (Signed)
Documented OSA, preparing to start CPAP 8 cm water.

## 2017-10-16 ENCOUNTER — Encounter (HOSPITAL_COMMUNITY): Payer: Self-pay | Admitting: Internal Medicine

## 2017-10-16 ENCOUNTER — Other Ambulatory Visit: Payer: Self-pay

## 2017-10-16 ENCOUNTER — Ambulatory Visit (HOSPITAL_COMMUNITY)
Admission: RE | Admit: 2017-10-16 | Discharge: 2017-10-16 | Disposition: A | Discharge status: Home / Self Care | Payer: 59 | Source: Ambulatory Visit | Attending: Internal Medicine | Admitting: Internal Medicine

## 2017-10-16 VITALS — BP 102/77 | HR 90 | Wt 123.0 lb

## 2017-10-16 DIAGNOSIS — I5022 Chronic systolic (congestive) heart failure: Secondary | ICD-10-CM | POA: Insufficient documentation

## 2017-10-16 DIAGNOSIS — I421 Obstructive hypertrophic cardiomyopathy: Secondary | ICD-10-CM | POA: Insufficient documentation

## 2017-10-16 DIAGNOSIS — G4730 Sleep apnea, unspecified: Secondary | ICD-10-CM | POA: Insufficient documentation

## 2017-10-16 DIAGNOSIS — I110 Hypertensive heart disease with heart failure: Secondary | ICD-10-CM | POA: Diagnosis not present

## 2017-10-16 DIAGNOSIS — I4892 Unspecified atrial flutter: Secondary | ICD-10-CM | POA: Diagnosis not present

## 2017-10-16 DIAGNOSIS — R9431 Abnormal electrocardiogram [ECG] [EKG]: Secondary | ICD-10-CM | POA: Insufficient documentation

## 2017-10-16 DIAGNOSIS — Z950 Presence of cardiac pacemaker: Secondary | ICD-10-CM | POA: Insufficient documentation

## 2017-10-16 DIAGNOSIS — F419 Anxiety disorder, unspecified: Secondary | ICD-10-CM | POA: Insufficient documentation

## 2017-10-16 DIAGNOSIS — Z87891 Personal history of nicotine dependence: Secondary | ICD-10-CM | POA: Insufficient documentation

## 2017-10-16 DIAGNOSIS — Z7982 Long term (current) use of aspirin: Secondary | ICD-10-CM | POA: Diagnosis not present

## 2017-10-16 DIAGNOSIS — Z79899 Other long term (current) drug therapy: Secondary | ICD-10-CM | POA: Diagnosis not present

## 2017-10-16 DIAGNOSIS — I480 Paroxysmal atrial fibrillation: Secondary | ICD-10-CM

## 2017-10-16 DIAGNOSIS — Z7901 Long term (current) use of anticoagulants: Secondary | ICD-10-CM | POA: Diagnosis not present

## 2017-10-16 DIAGNOSIS — I4819 Other persistent atrial fibrillation: Secondary | ICD-10-CM | POA: Diagnosis not present

## 2017-10-16 NOTE — Patient Instructions (Addendum)
Keep appointment with Dr Curt Bears on Mon 10/14  We will contact you in 3 months to schedule your next appointment.

## 2017-10-16 NOTE — Progress Notes (Signed)
Advanced Heart Failure Clinic Note   Patient ID: Angela Shepherd, female   DOB: 09/11/1950, 67 y.o.   MRN: [id number] PCP: Dr. Birdie Riddle  HPI: Angela Shepherd is a 67 y.o. 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 She has a h/o HOCM, bradycardia s/p PPM in the 90s at [facility]. PMHx also notable for HTN and prior tobacco abuse (quit 02/2010).   Was admitted in December 2011 with acute HF. Echo with EF 30-35%, There was also evidence of ASH with septum of 1.7 PW = 1.9. Diuresed and scheduled for outpt cath.  Cath 12/2009: Normal coronaries. EF 40-45%.  Central aortic pressure was 86/47 with a mean of 62.  LV pressure is 74/13 with an EDP of 2.  Right atrial pressure mean of 3.  RV pressure 26/3 with an EDP of 5.  PA pressure 28/5 with a mean of 16.  Pulmonary capillary wedge pressure mean of 7.  Fick cardiac output was 3.4 and cardiac index 2.1.   Echo in March 2012:  EF 50-55%. Grade 2 diastolic dysfunction. IVS 1.4cm.  Echo 4/14: EF 40-45%  ECHO 09/03/13: EF 40-45% Echo 4/16: EF 45-50%  Was seen by EP and noted to have a high percentage of RV pacing (99%) and it was felt this was worsening LV function. Underwent upgrade to [facility]. Jude CRT-P in 5/14.  Echo in 4/16 showed EF 45-50% with mild LVH.   Pt admitted to [facility] 08/2016 for CHF. Required BiPAP on admission but quickly weaned off O2. Pt denied medical or dietary non-compliance. Was told about Cardiomems device which she would like to pursue if she is a candidate.   Pt states she was admitted to [facility] again last week (09/20/16) and diuresed 8 lbs. No changes were made to her chronic medications.   Admitted in January to [facility] for volume overload (BNP 1400), and then in February for volume depletion requiring IVF. She states she was discharged 8 lbs up from her baseline weight.   Admitted in 6/19 for syncope with SBP 71/59. Arlyce Harman and losartan stopped.  Seen by [facility] in [facility] and found to be carrier for HOCM  .  She presents today for regular follow up. Following BP every day. SBP labile ranges 94-133. Mostly 100-110.  If BP is < 100 she won't take losartan. Only happens once in a while. Weight stable 120-122. Feels good. Walking 30 mins a day on TM without problem.  Recently saw Dr. Curt Bears and found to be back in AF. Had DC-CV 09/19/17. Just got CPAP. Having trouble using it. Getting smaller mask. No edema, orthopnea, CP or PN. Remains on Eliquis 5 bid with no bleeding.    Echo 6/19 EF 35-40% Mild LVH no LVOT obstruction  Echo 08/22/16 At [facility]: LVEF 40-45%, mild MR, Mild TR  SH: Nonsmoker, works at [employer], lives in [place].   Review of systems complete and found to be negative unless listed in HPI.   Past Medical History:  Diagnosis Date  . Bradycardia    a. initial PPM placed 1994 at [facility] with gen change 2002. b. 1*AVB & intermittent 2nd degree AVB + CHF --> upgrade to [facility]. Jude BiV PPM 05/08/12.  . Chronic systolic CHF (congestive heart failure) ([place])    a. EF 30-35% dx in 2011 -> most recent 40-45% by echo 04/2012. b. s/p upgrade to BiV-PPM 05/08/12. c. Med titration limited by hypotension.  . Cluster headache syndrome   . Hypertr obst cardiomyop   .  Hypokalemia   . Hypotension   . Paroxysmal atrial fibrillation (HCC)    a. identified on PPM interrogation b. longest episode 1 hour; if burden increases, will need [place]   . Pneumonia 2008  . Sleep apnea     Current Outpatient Medications  Medication Sig Dispense Refill  . ALPRAZolam (XANAX) 0.25 MG tablet TAKE 1 TABLET (0.25 MG TOTAL) BY MOUTH 2 (TWO) TIMES DAILY AS NEEDED FOR ANXIETY. 30 tablet 3  . AMBULATORY NON FORMULARY MEDICATION Medication Name: 100% O2 15 leters a min for 15-20 mins  Via Nasal cannual 1 Bottle 2  . amitriptyline (ELAVIL) 10 MG tablet Take 1 tablet (10 mg total) by mouth at bedtime. 30 tablet 3  . aspirin 81 MG chewable tablet Chew 81 mg by mouth daily.    . budesonide-formoterol (SYMBICORT) 160-4.5 MCG/ACT  inhaler INHALE 2 PUFFS BY MOUTH INTO THE LUNGS 2 TIMES A DAY 3 Inhaler 1  . carvedilol (COREG) 6.25 MG tablet TAKE 1 TABLET BY MOUTH 2 TIMES DAILY WITH A MEAL. 60 tablet 3  . cyclobenzaprine (FLEXERIL) 5 MG tablet TAKE 1 TABLET (5 MG TOTAL) BY MOUTH 3 (THREE) TIMES DAILY AS NEEDED FOR MUSCLE SPASMS. 30 tablet 2  . diclofenac sodium (VOLTAREN) 1 % GEL Apply 2 g topically 4 (four) times daily. 3 Tube 0  . ELIQUIS 5 MG TABS tablet TAKE 1 TABLET BY MOUTH TWICE DAILY 60 tablet 2  . FLUoxetine (PROZAC) 20 MG capsule TAKE 1 CAPSULE (20 MG TOTAL) BY MOUTH DAILY. 30 capsule 3  . fluticasone (FLONASE) 50 MCG/ACT nasal spray PLACE 2 SPRAYS INTO BOTH NOSTRILS DAILY. 16 g 5  . ipratropium-albuterol (DUONEB) 0.5-2.5 (3) MG/3ML SOLN USE 1 VIAL (3ML) BY NEBULIZER EVERY 4 TO 6 HOURS AS NEEDED 270 mL 1  . loratadine (CLARITIN) 10 MG tablet Take 1 tablet (10 mg total) by mouth daily. 30 tablet 5  . losartan (COZAAR) 25 MG tablet Take 1 tablet (25 mg total) by mouth daily. 30 tablet 11  . montelukast (SINGULAIR) 10 MG tablet Take 1 tablet (10 mg total) by mouth at bedtime. 30 tablet 5  . nicotine (NICODERM CQ - DOSED IN MG/24 HOURS) 14 mg/24hr patch Place 1 patch (14 mg total) onto the skin daily. 28 patch 0  . potassium chloride SA (K-DUR,KLOR-CON) 20 MEQ tablet TAKE 1 TABLET (20 MEQ TOTAL) BY MOUTH DAILY. 90 tablet 1  . Spacer/Aero-Holding Chambers (AEROCHAMBER MV) inhaler Use as instructed with Symbicort 1 each 0  . SUMAtriptan (IMITREX) 50 MG tablet TAKE 1 TABLET AT ONSET OF HEADACHE. MAY REPEAT IN 2 HOURS IF HEADACHE PERSISTS OR RECURS. 10 tablet 6  . Tiotropium Bromide Monohydrate (SPIRIVA RESPIMAT) 2.5 MCG/ACT AERS Inhale 2 puffs into the lungs daily. 3 Inhaler 1  . topiramate (TOPAMAX) 50 MG tablet Take 50 mg by mouth daily as needed.     . torsemide (DEMADEX) 20 MG tablet Take 1 tablet (20 mg total) by mouth 2 (two) times daily. 60 tablet 5  . traMADol (ULTRAM) 50 MG tablet Take 0.5 tablets (25 mg total) by  mouth every 12 (twelve) hours as needed. 12 tablet 0  . VENTOLIN HFA 108 (90 Base) MCG/ACT inhaler INHALE 1-2 PUFFS INTO THE LUNGS EVERY 6 (SIX) HOURS AS NEEDED FOR WHEEZING OR SHORTNESS OF BREATH. 1 Inhaler 5   No current facility-administered medications for this encounter.    PHYSICAL EXAM: Vitals:   10/16/17 1521  BP: 102/77  Pulse: 90  SpO2: 100%  Weight: 55.8 kg (123  lb)    Wt Readings from Last 3 Encounters:  10/16/17 55.8 kg (123 lb)  10/11/17 55.3 kg (122 lb)  09/24/17 53.5 kg (118 lb)    Physical Exam.  General:  Well appearing. No resp difficulty HEENT: normal anicteric  Neck: supple. no JVD. Carotids 2+ bilat; no bruits. No lymphadenopathy or thryomegaly appreciated. Cor: PMI nondisplaced. Irregular rate & rhythm. No rubs, gallops or murmurs. Lungs: clear with decreased breath sounds throughout  Abdomen: soft, nontender, nondistended. No hepatosplenomegaly. No bruits or masses. Good bowel sounds. Extremities: no cyanosis, clubbing, rash, edema Neuro: alert & orientedx3, cranial nerves grossly intact. moves all 4 extremities w/o difficulty. Affect pleasant  ECG: AFL 91 Personally reviewed   ASSESSMENT & PLAN:  1. Chronic systolic CHF: EF 77% s/p CRT-P upgrade 05/2012 (due to chronic RV pacing)  - Echo 6/19: LVEF 35-40%, mild MR, Mild TR - Overall stable NYHA II - Volume status looks good - Continue torsemide 20 mg BID - Recently admitted for symptomatic hypotension and losartan and spiro stopped. Now back on losartan 25 daily and tolerating well. BP too low to titrate or switch to [place]. - Continue carvedilol 6.25 bid - Continue to hold spiro for now as BP remains soft.  - Reinforced fluid restriction to < 2 L daily, sodium restriction to less than 2000 mg daily, and the importance of daily weights.   2. H/o HOCM  - Stable on most recent Echo. No LVOT gradient - Has been seen in [facility]. All 1st degree relatives have been tested 3. HTN -  BP recently low with syncope but now stable  4. Tobacco use quit 2012 - She is smoking a bit again. Encouraged to quit - PFTs relatively stable 06/2016. 5. Atypical chest pain - No further. Suspect GI etiology - Asked pt to follow up with any repeat chest pain.  6. Anxiety - improved. Per PCP 7. Persistent AF/AFL - Follows with Dr. Curt Bears. Had DC-CV on 09/19/17. Now back in AF. - Has f/u with Dr. Curt Bears in 1 week to discuss ablation  - Continue Eliquis. She has not had bleeding    Glori Bickers, MD  2:32 PM

## 2017-10-18 DIAGNOSIS — J449 Chronic obstructive pulmonary disease, unspecified: Secondary | ICD-10-CM | POA: Diagnosis not present

## 2017-10-21 ENCOUNTER — Encounter: Payer: Self-pay | Admitting: Cardiology

## 2017-10-21 ENCOUNTER — Ambulatory Visit (INDEPENDENT_AMBULATORY_CARE_PROVIDER_SITE_OTHER): Payer: 59 | Admitting: Cardiology

## 2017-10-21 ENCOUNTER — Other Ambulatory Visit (HOSPITAL_COMMUNITY): Payer: Self-pay | Admitting: Internal Medicine

## 2017-10-21 VITALS — BP 116/64 | HR 81 | Ht 62.0 in | Wt 123.0 lb

## 2017-10-21 DIAGNOSIS — I421 Obstructive hypertrophic cardiomyopathy: Secondary | ICD-10-CM | POA: Diagnosis not present

## 2017-10-21 DIAGNOSIS — I483 Typical atrial flutter: Secondary | ICD-10-CM | POA: Diagnosis not present

## 2017-10-21 DIAGNOSIS — I4819 Other persistent atrial fibrillation: Secondary | ICD-10-CM

## 2017-10-21 DIAGNOSIS — I5022 Chronic systolic (congestive) heart failure: Secondary | ICD-10-CM | POA: Diagnosis not present

## 2017-10-21 DIAGNOSIS — I10 Essential (primary) hypertension: Secondary | ICD-10-CM | POA: Diagnosis not present

## 2017-10-21 MED FILL — MONTELUKAST SOD 10 MG TAB: 10 | 30 days supply | Qty: 30 | Fill #3

## 2017-10-21 MED FILL — ALPRAZolam 0.25 MG TABS: 0.25 | 15 days supply | Qty: 30 | Fill #2

## 2017-10-21 MED FILL — TORSEMIDE 20 MG TABLET: 20 | 30 days supply | Qty: 60 | Fill #2

## 2017-10-21 MED FILL — ELIQUIS 5 MG TABLET: 5 | 30 days supply | Qty: 60 | Fill #0

## 2017-10-21 NOTE — Progress Notes (Signed)
Electrophysiology Office Note   Date:  10/22/2017   ID:  Angela Shepherd, DOB Nov 29, 1950, MRN [id number]  PCP:  Midge Minium, MD  Cardiologist:  [place] [facility] Electrophysiologist:  Constance Haw, MD    No chief complaint on file.    History of Present Illness: Angela Shepherd is a 67 y.o. female who is being seen today for the evaluation of HOCM at the request of Tabori, Aundra Millet, MD. Presenting today for electrophysiology evaluation.  History of hypertrophic cardiomyopathy, bradycardia status post pacemaker in the 1990s at [facility], hypertension, tobacco abuse who quit in 2012.  She was admitted December 2011 with acute heart failure was found to have an ejection fraction of 30 to 35%.  Catheterization showed normal coronary arteries with an EF of 40 to 45%.  She was seen by EP and was found to have a high burden of RV pacing and was felt to be causing her LV dysfunction.  She underwent upgrade to a [employer] CRT-P 05/2012.  Echo in 2016 showed an EF of 45 to 50%.  He had a cardioversion on 09/19/2017, but unfortunately quickly went back into atrial fibrillation.   Today, denies symptoms of palpitations, chest pain, shortness of breath, orthopnea, PND, lower extremity edema, claudication, dizziness, presyncope, syncope, bleeding, or neurologic sequela. The patient is tolerating medications without difficulties.  She has noted fatigue, weakness, and shortness of breath since her cardioversion.  She has been in and out of atrial fibrillation since that time.  She is also had atrial flutter.   Past Medical History:  Diagnosis Date  . Bradycardia    a. initial PPM placed 1994 at [facility] with gen change 2002. b. 1*AVB & intermittent 2nd degree AVB + CHF --> upgrade to [facility]. Jude BiV PPM 05/08/12.  . Chronic systolic CHF (congestive heart failure) ([place])    a. EF 30-35% dx in 2011 -> most recent 40-45% by echo 04/2012. b. s/p upgrade to BiV-PPM 05/08/12. c. Med  titration limited by hypotension.  . Cluster headache syndrome   . Hypertr obst cardiomyop   . Hypokalemia   . Hypotension   . Paroxysmal atrial fibrillation (HCC)    a. identified on PPM interrogation b. longest episode 1 hour; if burden increases, Jakyiah Briones need [place]   . Pneumonia 2008  . Sleep apnea    Past Surgical History:  Procedure Laterality Date  . BI-VENTRICULAR PACEMAKER UPGRADE N/A 05/08/2012   upgrade to SJM Anthem (CRT-P) by Dr Rayann Heman  . CARDIOVERSION N/A 09/19/2017   Procedure: CARDIOVERSION;  Surgeon: Sanda Klein, MD;  Location: [place] [facility];  Service: Cardiovascular;  Laterality: N/A;  . [place]; 2002; 05/08/2012  . TONSILLECTOMY  ~ 1959  . TUBAL LIGATION  1984?     Current Outpatient Medications  Medication Sig Dispense Refill  . ALPRAZolam (XANAX) 0.25 MG tablet TAKE 1 TABLET (0.25 MG TOTAL) BY MOUTH 2 (TWO) TIMES DAILY AS NEEDED FOR ANXIETY. 30 tablet 3  . AMBULATORY NON FORMULARY MEDICATION Medication Name: 100% O2 15 leters a min for 15-20 mins  Via Nasal cannual 1 Bottle 2  . amitriptyline (ELAVIL) 10 MG tablet Take 1 tablet (10 mg total) by mouth at bedtime. 30 tablet 3  . aspirin 81 MG chewable tablet Chew 81 mg by mouth daily.    . budesonide-formoterol (SYMBICORT) 160-4.5 MCG/ACT inhaler INHALE 2 PUFFS BY MOUTH INTO THE LUNGS 2 TIMES A DAY 3 Inhaler 1  . carvedilol (COREG) 6.25 MG tablet TAKE 1  TABLET BY MOUTH 2 TIMES DAILY WITH A MEAL. 60 tablet 3  . cyclobenzaprine (FLEXERIL) 5 MG tablet TAKE 1 TABLET (5 MG TOTAL) BY MOUTH 3 (THREE) TIMES DAILY AS NEEDED FOR MUSCLE SPASMS. 30 tablet 2  . diclofenac sodium (VOLTAREN) 1 % GEL Apply 2 g topically 4 (four) times daily. 3 Tube 0  . ELIQUIS 5 MG TABS tablet TAKE 1 TABLET BY MOUTH TWICE DAILY 60 tablet 5  . FLUoxetine (PROZAC) 20 MG capsule TAKE 1 CAPSULE (20 MG TOTAL) BY MOUTH DAILY. 30 capsule 3  . fluticasone (FLONASE) 50 MCG/ACT nasal spray PLACE 2 SPRAYS INTO BOTH NOSTRILS DAILY. 16 g 5  .  ipratropium-albuterol (DUONEB) 0.5-2.5 (3) MG/3ML SOLN USE 1 VIAL (3ML) BY NEBULIZER EVERY 4 TO 6 HOURS AS NEEDED 270 mL 1  . loratadine (CLARITIN) 10 MG tablet Take 1 tablet (10 mg total) by mouth daily. 30 tablet 5  . losartan (COZAAR) 25 MG tablet Take 1 tablet (25 mg total) by mouth daily. 30 tablet 11  . montelukast (SINGULAIR) 10 MG tablet Take 1 tablet (10 mg total) by mouth at bedtime. 30 tablet 5  . nicotine (NICODERM CQ - DOSED IN MG/24 HOURS) 14 mg/24hr patch Place 1 patch (14 mg total) onto the skin daily. 28 patch 0  . potassium chloride SA (K-DUR,KLOR-CON) 20 MEQ tablet TAKE 1 TABLET (20 MEQ TOTAL) BY MOUTH DAILY. 90 tablet 1  . Spacer/Aero-Holding Chambers (AEROCHAMBER MV) inhaler Use as instructed with Symbicort 1 each 0  . SUMAtriptan (IMITREX) 50 MG tablet TAKE 1 TABLET AT ONSET OF HEADACHE. MAY REPEAT IN 2 HOURS IF HEADACHE PERSISTS OR RECURS. 10 tablet 6  . Tiotropium Bromide Monohydrate (SPIRIVA RESPIMAT) 2.5 MCG/ACT AERS Inhale 2 puffs into the lungs daily. 3 Inhaler 1  . topiramate (TOPAMAX) 50 MG tablet Take 50 mg by mouth daily as needed.     . torsemide (DEMADEX) 20 MG tablet Take 1 tablet (20 mg total) by mouth 2 (two) times daily. 60 tablet 5  . traMADol (ULTRAM) 50 MG tablet Take 0.5 tablets (25 mg total) by mouth every 12 (twelve) hours as needed. 12 tablet 0  . VENTOLIN HFA 108 (90 Base) MCG/ACT inhaler INHALE 1-2 PUFFS INTO THE LUNGS EVERY 6 (SIX) HOURS AS NEEDED FOR WHEEZING OR SHORTNESS OF BREATH. 1 Inhaler 5   No current facility-administered medications for this visit.     Allergies:   Prednisone   Social History:  The patient  reports that she quit smoking about 7 years ago. She has a 3.00 pack-year smoking history. She has never used smokeless tobacco. She reports that she drinks about 2.0 standard drinks of alcohol per week. She reports that she does not use drugs.   Family History:  The patient's family history includes Cancer (age of onset: 40) in her  sister; Cardiomyopathy in her sister; Dementia in her mother; Diabetes in her brother and unknown relative; Heart disease in her sister.    ROS:  Please see the history of present illness.   Otherwise, review of systems is positive for weak, fatigue, short of breath.   All other systems are reviewed and negative.   PHYSICAL EXAM: VS:  BP 116/64   Pulse 81   Ht 5\' 2"  (1.575 m)   Wt 123 lb (55.8 kg)   LMP  (LMP Unknown)   SpO2 90%   BMI 22.50 kg/m  , BMI Body mass index is 22.5 kg/m. GEN: Well nourished, well developed, in no acute distress  HEENT: normal  Neck: no JVD, carotid bruits, or masses Cardiac: RRR; no murmurs, rubs, or gallops,no edema  Respiratory:  clear to auscultation bilaterally, normal work of breathing GI: soft, nontender, nondistended, + BS MS: no deformity or atrophy  Skin: warm and dry, device site well healed Neuro:  Strength and sensation are intact Psych: euthymic mood, full affect  EKG:  EKG is not ordered today. Personal review of the ekg ordered 10/16/2017 shows atrial flutter, rate 91  Personal review of the device interrogation today. Results in [place]: 06/20/2017: B Natriuretic Peptide 781.6 06/24/2017: ALT 55; Magnesium 2.2 08/05/2017: Pro B Natriuretic peptide (BNP) 1,698.0 09/18/2017: BUN 25; Creatinine, Ser 1.15; Hemoglobin 13.9; Platelets 204; Potassium 3.8; Sodium 141    Lipid Panel     Component Value Date/Time   CHOL 179 06/21/2017 0143   TRIG 42 06/21/2017 0143   HDL 65 06/21/2017 0143   CHOLHDL 2.8 06/21/2017 0143   VLDL 8 06/21/2017 0143   LDLCALC 106 (H) 06/21/2017 0143   LDLDIRECT 149.0 06/02/2015 1351     Wt Readings from Last 3 Encounters:  10/21/17 123 lb (55.8 kg)  10/16/17 123 lb (55.8 kg)  10/11/17 122 lb (55.3 kg)      Other studies Reviewed: Additional studies/ records that were reviewed today include: TTE 06/21/17  Review of the above records today demonstrates:   - Left ventricle: Wall  thickness was increased in a pattern of mild   LVH. Systolic function was moderately reduced. The estimated   ejection fraction was in the range of 35% to 40%. Severe   hypokinesis of the mid-apicalanteroseptal, lateral,   inferolateral, and apical myocardium. - Ventricular septum: Septal motion showed abnormal function and   dyssynergy. - Aortic valve: There was mild regurgitation. - Mitral valve: There was mild to moderate regurgitation. - Left atrium: The atrium was moderately dilated. - Pulmonary arteries: Systolic pressure was moderately increased.   PA peak pressure: 41 mm Hg (S).   ASSESSMENT AND PLAN:  1.  Chronic systolic heart failure: This recent ejection fraction 35 to 40%.  Currently on optimal medical therapy per heart failure.  2.  Hypertrophic cardiomyopathy: Stable on most recent echo.  Per primary cardiology.  3.  Hypertension: Well-controlled today.  No changes.  4.  Atrial fibrillation/atrial flutter: Had a cardioversion with early return to atrial fibrillation.  She is in sinus rhythm today, but has been in atrial fibrillation most of the time as well as having some atrial flutter.  She felt much better in sinus rhythm.  We Finbar Nippert thus plan for ablation.  Risks and benefits of ablation were discussed and include bleeding, tamponade, heart block, stroke, damage to surrounding organs.  She understands these risks and is agreed to the procedure.  This patients CHA2DS2-VASc Score and unadjusted Ischemic Stroke Rate (% per year) is equal to 4.8 % stroke rate/year from a score of 4  Above score calculated as 1 point each if present [CHF, HTN, DM, Vascular=MI/PAD/Aortic Plaque, Age if 65-74, or Female] Above score calculated as 2 points each if present [Age > 75, or Stroke/TIA/TE]   Current medicines are reviewed at length with the patient today.   The patient does not have concerns regarding her medicines.  The following changes were made today: None  Labs/ tests  ordered today include:  No orders of the defined types were placed in this encounter.  Case discussed with primary cardiology  Disposition:   FU with Xavious Sharrar 3  month  Signed, Sarahy Creedon Meredith Leeds, MD  10/22/2017 6:32 AM     [facility] [address] [place] [address] [phone] (office) [phone] (fax)

## 2017-10-22 MED FILL — CARVEDILOL 12.5 MG TABLET: 12.5 | 30 days supply | Qty: 60 | Fill #2

## 2017-10-23 ENCOUNTER — Encounter: Payer: Self-pay | Admitting: *Deleted

## 2017-10-23 ENCOUNTER — Telehealth: Payer: Self-pay | Admitting: *Deleted

## 2017-10-23 DIAGNOSIS — Z01812 Encounter for preprocedural laboratory examination: Secondary | ICD-10-CM

## 2017-10-23 DIAGNOSIS — I480 Paroxysmal atrial fibrillation: Secondary | ICD-10-CM

## 2017-10-23 NOTE — Telephone Encounter (Signed)
Spent 30 min on phone w/ pt. Scheduled AFib ablation for 11/8 Pt will stop by [facility] office next week for pre procedure blood work. CT and ablation instructions reviewed w/ pt and letters sent via [facility]. Post procedure follow up appts made. Patient verbalized understanding and agreeable to plan.

## 2017-10-24 MED FILL — FLUoxetine HCL 20 MG CAPS: 20 | 30 days supply | Qty: 30 | Fill #3

## 2017-10-28 DIAGNOSIS — G4733 Obstructive sleep apnea (adult) (pediatric): Secondary | ICD-10-CM | POA: Diagnosis not present

## 2017-10-31 ENCOUNTER — Other Ambulatory Visit: Payer: Self-pay | Admitting: Family Medicine

## 2017-10-31 MED FILL — SYMBICORT 160-4.5 MCG INH: 160-4.5 | 90 days supply | Qty: 31 | Fill #1

## 2017-10-31 MED FILL — SPIRIVA RESPIMAT INHAL SPRY: 2.5 | 90 days supply | Qty: 12 | Fill #1

## 2017-11-01 ENCOUNTER — Other Ambulatory Visit: Payer: Self-pay

## 2017-11-01 ENCOUNTER — Telehealth: Payer: Self-pay | Admitting: Family Medicine

## 2017-11-01 ENCOUNTER — Other Ambulatory Visit: Payer: 59

## 2017-11-01 DIAGNOSIS — Z01812 Encounter for preprocedural laboratory examination: Secondary | ICD-10-CM

## 2017-11-01 DIAGNOSIS — I480 Paroxysmal atrial fibrillation: Secondary | ICD-10-CM | POA: Diagnosis not present

## 2017-11-01 MED ORDER — COLCHICINE 0.6 MG PO TABS: 0.6000 mg | ORAL_TABLET | Freq: Two times a day (BID) | ORAL | 0 refills | Status: DC

## 2017-11-01 MED FILL — COLCHICINE 0.6 MG TABS: 0.6 | 15 days supply | Qty: 30 | Fill #0

## 2017-11-01 NOTE — Telephone Encounter (Signed)
Patient was informed.

## 2017-11-01 NOTE — Telephone Encounter (Signed)
Sent in 30 tablets downstairs to take twice a day until I see her on Monday.  Thanks!

## 2017-11-01 NOTE — Telephone Encounter (Signed)
-----   Message from Renae Fickle sent at 11/01/2017  8:37 AM EDT ----- Regarding: Rx Refill  Patient is experiencing a gout flare up in her left foot again. She has scheduled an appointment for Monday, but is asking if she can have a refill of Colchicine to get her through the weekend.   Patient uses pharmacy here at the [facility]

## 2017-11-02 LAB — BASIC METABOLIC PANEL
BUN / CREAT RATIO: 19 (ref 12–28)
BUN: 21 mg/dL (ref 8–27)
CALCIUM: 9.8 mg/dL (ref 8.7–10.3)
CO2: 26 mmol/L (ref 20–29)
Chloride: 101 mmol/L (ref 96–106)
Creatinine, Ser: 1.11 mg/dL — ABNORMAL HIGH (ref 0.57–1.00)
GFR calc non Af Amer: 52 mL/min/{1.73_m2} — ABNORMAL LOW (ref >59)
GFR, EST AFRICAN AMERICAN: 60 mL/min/{1.73_m2} (ref >59)
Glucose: 99 mg/dL (ref 65–99)
POTASSIUM: 4.3 mmol/L (ref 3.5–5.2)
Sodium: 144 mmol/L (ref 134–144)

## 2017-11-02 LAB — CBC
HEMATOCRIT: 45.1 % (ref 34.0–46.6)
Hemoglobin: 15.1 g/dL (ref 11.1–15.9)
MCH: 32.6 pg (ref 26.6–33.0)
MCHC: 33.5 g/dL (ref 31.5–35.7)
MCV: 97 fL (ref 79–97)
PLATELETS: 299 10*3/uL (ref 150–450)
RBC: 4.63 x10E6/uL (ref 3.77–5.28)
RDW: 15.5 % — AB (ref 12.3–15.4)
WBC: 5 10*3/uL (ref 3.4–10.8)

## 2017-11-04 ENCOUNTER — Encounter: Payer: Self-pay | Admitting: Family Medicine

## 2017-11-04 ENCOUNTER — Ambulatory Visit (INDEPENDENT_AMBULATORY_CARE_PROVIDER_SITE_OTHER): Payer: 59 | Admitting: Family Medicine

## 2017-11-04 VITALS — BP 113/73 | HR 72 | Ht 62.0 in | Wt 120.0 lb

## 2017-11-04 DIAGNOSIS — M79672 Pain in left foot: Secondary | ICD-10-CM | POA: Diagnosis not present

## 2017-11-04 DIAGNOSIS — M109 Gout, unspecified: Secondary | ICD-10-CM

## 2017-11-04 MED ORDER — ALLOPURINOL 100 MG PO TABS: 50.0000 mg | ORAL_TABLET | Freq: Every day | ORAL | 2 refills | Status: DC

## 2017-11-04 MED FILL — ALLOPURINOL 100 MG TABLET: 100 | 30 days supply | Qty: 15 | Fill #0

## 2017-11-04 NOTE — Progress Notes (Signed)
PCP: Midge Minium, MD  Subjective:   HPI: Patient is a 67 y.o. female here for left foot pain.  Patient reports about 4 days of left foot pain consistent with her gout flares.  Pain is located over the medial left midfoot.  Currently at rest is 0/10 but is worse with palpation or weightbearing.  She denies any specific injury.  Typically she takes colchicine twice daily and notices improvement after about 5 days.  She is currently on day 3 of this treatment.  She has never been on chronic uric acid lowering treatment.  She denies any recent fevers or chills.  She reports swelling which has improved over the weekend.  She denies any erythema at this time but states that this was present when the pain started.  No numbness or tingling.    Past Medical History:  Diagnosis Date  . Bradycardia    a. initial PPM placed 1994 at [facility] with gen change 2002. b. 1*AVB & intermittent 2nd degree AVB + CHF --> upgrade to [facility]. Jude BiV PPM 05/08/12.  . Chronic systolic CHF (congestive heart failure) ([place])    a. EF 30-35% dx in 2011 -> most recent 40-45% by echo 04/2012. b. s/p upgrade to BiV-PPM 05/08/12. c. Med titration limited by hypotension.  . Cluster headache syndrome   . Hypertr obst cardiomyop   . Hypokalemia   . Hypotension   . Paroxysmal atrial fibrillation (HCC)    a. identified on PPM interrogation b. longest episode 1 hour; if burden increases, will need [place]   . Pneumonia 2008  . Sleep apnea     Current Outpatient Medications on File Prior to Visit  Medication Sig Dispense Refill  . ALPRAZolam (XANAX) 0.25 MG tablet TAKE 1 TABLET (0.25 MG TOTAL) BY MOUTH 2 (TWO) TIMES DAILY AS NEEDED FOR ANXIETY. 30 tablet 3  . AMBULATORY NON FORMULARY MEDICATION Medication Name: 100% O2 15 leters a min for 15-20 mins  Via Nasal cannual 1 Bottle 2  . amitriptyline (ELAVIL) 10 MG tablet Take 1 tablet (10 mg total) by mouth at bedtime. 30 tablet 3  . aspirin 81 MG chewable tablet Chew 81 mg by mouth  daily.    . budesonide-formoterol (SYMBICORT) 160-4.5 MCG/ACT inhaler INHALE 2 PUFFS BY MOUTH INTO THE LUNGS 2 TIMES A DAY 3 Inhaler 1  . carvedilol (COREG) 6.25 MG tablet TAKE 1 TABLET BY MOUTH 2 TIMES DAILY WITH A MEAL. 60 tablet 3  . colchicine 0.6 MG tablet Take 1 tablet (0.6 mg total) by mouth 2 (two) times daily. 30 tablet 0  . cyclobenzaprine (FLEXERIL) 5 MG tablet TAKE 1 TABLET (5 MG TOTAL) BY MOUTH 3 (THREE) TIMES DAILY AS NEEDED FOR MUSCLE SPASMS. 30 tablet 2  . diclofenac sodium (VOLTAREN) 1 % GEL Apply 2 g topically 4 (four) times daily. 3 Tube 0  . ELIQUIS 5 MG TABS tablet TAKE 1 TABLET BY MOUTH TWICE DAILY 60 tablet 5  . FLUoxetine (PROZAC) 20 MG capsule TAKE 1 CAPSULE (20 MG TOTAL) BY MOUTH DAILY. 30 capsule 3  . fluticasone (FLONASE) 50 MCG/ACT nasal spray PLACE 2 SPRAYS INTO BOTH NOSTRILS DAILY. 16 g 5  . ipratropium-albuterol (DUONEB) 0.5-2.5 (3) MG/3ML SOLN USE 1 VIAL (3ML) BY NEBULIZER EVERY 4 TO 6 HOURS AS NEEDED 270 mL 1  . loratadine (CLARITIN) 10 MG tablet Take 1 tablet (10 mg total) by mouth daily. 30 tablet 5  . losartan (COZAAR) 25 MG tablet Take 1 tablet (25 mg total) by mouth daily.  30 tablet 11  . montelukast (SINGULAIR) 10 MG tablet Take 1 tablet (10 mg total) by mouth at bedtime. 30 tablet 5  . nicotine (NICODERM CQ - DOSED IN MG/24 HOURS) 14 mg/24hr patch Place 1 patch (14 mg total) onto the skin daily. 28 patch 0  . potassium chloride SA (K-DUR,KLOR-CON) 20 MEQ tablet TAKE 1 TABLET (20 MEQ TOTAL) BY MOUTH DAILY. 90 tablet 1  . Spacer/Aero-Holding Chambers (AEROCHAMBER MV) inhaler Use as instructed with Symbicort 1 each 0  . SUMAtriptan (IMITREX) 50 MG tablet TAKE 1 TABLET AT ONSET OF HEADACHE. MAY REPEAT IN 2 HOURS IF HEADACHE PERSISTS OR RECURS. 10 tablet 6  . Tiotropium Bromide Monohydrate (SPIRIVA RESPIMAT) 2.5 MCG/ACT AERS Inhale 2 puffs into the lungs daily. 3 Inhaler 1  . topiramate (TOPAMAX) 50 MG tablet Take 50 mg by mouth daily as needed.     . torsemide  (DEMADEX) 20 MG tablet Take 1 tablet (20 mg total) by mouth 2 (two) times daily. 60 tablet 5  . traMADol (ULTRAM) 50 MG tablet Take 0.5 tablets (25 mg total) by mouth every 12 (twelve) hours as needed. 12 tablet 0  . VENTOLIN HFA 108 (90 Base) MCG/ACT inhaler INHALE 1-2 PUFFS INTO THE LUNGS EVERY 6 (SIX) HOURS AS NEEDED FOR WHEEZING OR SHORTNESS OF BREATH. 1 Inhaler 5   No current facility-administered medications on file prior to visit.     Past Surgical History:  Procedure Laterality Date  . BI-VENTRICULAR PACEMAKER UPGRADE N/A 05/08/2012   upgrade to SJM Anthem (CRT-P) by Dr Rayann Heman  . CARDIOVERSION N/A 09/19/2017   Procedure: CARDIOVERSION;  Surgeon: Sanda Klein, MD;  Location: [place] [facility];  Service: Cardiovascular;  Laterality: N/A;  . [place]; 2002; 05/08/2012  . TONSILLECTOMY  ~ 1959  . TUBAL LIGATION  1984?    Allergies  Allergen Reactions  . Prednisone Shortness Of Breath    CHF     Social History   Socioeconomic History  . Marital status: Widowed    Spouse name: Not on file  . Number of children: 2  . Years of education: Not on file  . Highest education level: Not on file  Occupational History  . Occupation: [employer]    Employer: [employer]  . Financial resource strain: Not on file  . Food insecurity:    Worry: Not on file    Inability: Not on file  . Transportation needs:    Medical: Not on file    Non-medical: Not on file  Tobacco Use  . Smoking status: Former Smoker    Packs/day: 0.10    Years: 30.00    Pack years: 3.00    Last attempt to quit: 01/08/2010    Years since quitting: 7.8  . Smokeless tobacco: Never Used  Substance and Sexual Activity  . Alcohol use: Yes    Alcohol/week: 2.0 standard drinks    Types: 2 Glasses of wine per week  . Drug use: No  . Sexual activity: Never  Lifestyle  . Physical activity:    Days per week: Not on file    Minutes per session: Not on file  . Stress: Not on  file  Relationships  . Social connections:    Talks on phone: Not on file    Gets together: Not on file    Attends religious service: Not on file    Active member of club or organization: Not on file    Attends meetings of clubs  or organizations: Not on file    Relationship status: Not on file  . Intimate partner violence:    Fear of current or ex partner: Not on file    Emotionally abused: Not on file    Physically abused: Not on file    Forced sexual activity: Not on file  Other Topics Concern  . Not on file  Social History Narrative   ** Merged History Encounter **        Family History  Problem Relation Age of Onset  . Heart disease Sister   . Dementia Mother   . Cancer Sister 33       uterine  . Diabetes Unknown   . Cardiomyopathy Sister   . Diabetes Brother   . Colon cancer Neg Hx   . Esophageal cancer Neg Hx   . Stomach cancer Neg Hx   . Rectal cancer Neg Hx     BP 113/73   Pulse 72   Ht '5\' 2"'  (1.575 m)   Wt 120 lb (54.4 kg)   LMP  (LMP Unknown)   BMI 21.95 kg/m   Review of Systems: See HPI above.     Objective:  Physical Exam:  Gen: awake, alert, NAD, comfortable in exam room Pulm: breathing unlabored  Left foot: Inspection:  No obvious bony deformity.  Mild soft tissue swelling.  No erythema, or bruising. Palpation: Tenderness in the area of the first TMT. ROM: Full ROM of the ankle. Strength: 5/5 strength ankle in all planes. Neurovascular: N/V intact distally in the lower extremity.  Right foot: No obvious deformity or swelling. No tenderness over the midfoot. Full range of motion with 5/5 strength.   N/V intact.   Assessment & Plan:  1.  Left foot pain secondary to gout flare- patient's gout flares have traditionally been in an atypical location. No h/o acute injury. Symptoms are c/w her previous gout flares - continue colchicine 0.9m BID - will start renally dosed  (eGFR 52) allopurinol 57mx4 weeks followed by repeat uric acid level  (previous level was 10.9) and BMP with plan to increase to 10048mt 4 weeks as long as the medication is tolerated - pt will call in 1-2 weeks if no improvement as expected

## 2017-11-04 NOTE — Patient Instructions (Addendum)
Continue colchicine as directed until foot pain has improved. Start allopurinol - 1/2 tablet daily. Call us when you're going to come in to repeat your labs so we can get the orders upstairs (BMP and Uric acid levels). We may increase your dose depending on those results. Call me in 1-2 weeks if you're not improving though.

## 2017-11-05 ENCOUNTER — Encounter: Payer: Self-pay | Admitting: Family Medicine

## 2017-11-11 ENCOUNTER — Encounter (HOSPITAL_COMMUNITY): Payer: Self-pay

## 2017-11-11 ENCOUNTER — Ambulatory Visit (HOSPITAL_COMMUNITY)
Admission: RE | Admit: 2017-11-11 | Discharge: 2017-11-11 | Disposition: A | Discharge status: Home / Self Care | Payer: 59 | Source: Ambulatory Visit | Attending: Cardiology | Admitting: Cardiology

## 2017-11-11 ENCOUNTER — Ambulatory Visit (HOSPITAL_COMMUNITY): Payer: 59

## 2017-11-11 DIAGNOSIS — J439 Emphysema, unspecified: Secondary | ICD-10-CM | POA: Diagnosis not present

## 2017-11-11 DIAGNOSIS — G4733 Obstructive sleep apnea (adult) (pediatric): Secondary | ICD-10-CM | POA: Diagnosis not present

## 2017-11-11 DIAGNOSIS — I480 Paroxysmal atrial fibrillation: Secondary | ICD-10-CM | POA: Diagnosis not present

## 2017-11-11 DIAGNOSIS — R918 Other nonspecific abnormal finding of lung field: Secondary | ICD-10-CM | POA: Diagnosis not present

## 2017-11-11 MED ORDER — IOPAMIDOL (ISOVUE-370) INJECTION 76%
100.0000 mL | Freq: Once | INTRAVENOUS | Status: AC | PRN
  Administered 2017-11-11: 100 mL via INTRAVENOUS

## 2017-11-14 NOTE — Anesthesia Preprocedure Evaluation (Signed)
Anesthesia Evaluation  Patient identified by MRN, date of birth, ID band Patient awake    Reviewed: Allergy & Precautions, NPO status , Patient's Chart, lab work & pertinent test results  History of Anesthesia Complications Negative for: history of anesthetic complications  Airway Mallampati: II  TM Distance: >3 FB Neck ROM: Full    Dental  (+) Dental Advisory Given   Pulmonary COPD, former smoker,    breath sounds clear to auscultation       Cardiovascular hypertension, Pt. on home beta blockers and Pt. on medications +CHF  + dysrhythmias Atrial Fibrillation + pacemaker  Rhythm:Irregular Rate:Normal   '19 TTE - Mild LVH. EF 35% to 40%. Severe hypokinesis of the mid-apicalanteroseptal, lateral, inferolateral, and apical myocardium. Ventricular septal motion showed abnormal function and dyssynergy. Mild AI, mild-moderate MR. LA moderately dilated.  PASP was moderately increased. PA peak pressure: 41 mm Hg  EKG - A-sensed, V-paced rhythm    Neuro/Psych  Headaches, Anxiety    GI/Hepatic negative GI ROS, Neg liver ROS,   Endo/Other  negative endocrine ROS  Renal/GU Renal InsufficiencyRenal disease  negative genitourinary   Musculoskeletal negative musculoskeletal ROS (+)   Abdominal   Peds  Hematology negative hematology ROS (+)   Anesthesia Other Findings   Reproductive/Obstetrics                             Anesthesia Physical  Anesthesia Plan  ASA: III  Anesthesia Plan: General   Post-op Pain Management:    Induction: Intravenous  PONV Risk Score and Plan: 3 and Treatment may vary due to age or medical condition, Ondansetron and Dexamethasone  Airway Management Planned: Oral ETT and LMA  Additional Equipment: None  Intra-op Plan:   Post-operative Plan: Extubation in OR  Informed Consent: I have reviewed the patients History and Physical, chart, labs and discussed the  procedure including the risks, benefits and alternatives for the proposed anesthesia with the patient or authorized representative who has indicated his/her understanding and acceptance.   Dental advisory given  Plan Discussed with: CRNA, Anesthesiologist and Surgeon  Anesthesia Plan Comments: (  )        Anesthesia Quick Evaluation

## 2017-11-15 ENCOUNTER — Ambulatory Visit (HOSPITAL_COMMUNITY)
Admission: RE | Admit: 2017-11-15 | Discharge: 2017-11-15 | Disposition: A | Discharge status: Home / Self Care | Payer: 59 | Source: Ambulatory Visit | Attending: Cardiology | Admitting: Cardiology

## 2017-11-15 ENCOUNTER — Encounter (HOSPITAL_COMMUNITY): Payer: Self-pay

## 2017-11-15 ENCOUNTER — Ambulatory Visit (HOSPITAL_COMMUNITY): Payer: 59 | Admitting: Anesthesiology

## 2017-11-15 ENCOUNTER — Encounter (HOSPITAL_COMMUNITY)
Admission: RE | Disposition: A | Discharge status: Home / Self Care | Payer: Self-pay | Source: Ambulatory Visit | Attending: Cardiology

## 2017-11-15 ENCOUNTER — Other Ambulatory Visit: Payer: Self-pay

## 2017-11-15 DIAGNOSIS — Z9851 Tubal ligation status: Secondary | ICD-10-CM | POA: Insufficient documentation

## 2017-11-15 DIAGNOSIS — I509 Heart failure, unspecified: Secondary | ICD-10-CM | POA: Diagnosis not present

## 2017-11-15 DIAGNOSIS — I130 Hypertensive heart and chronic kidney disease with heart failure and stage 1 through stage 4 chronic kidney disease, or unspecified chronic kidney disease: Secondary | ICD-10-CM | POA: Insufficient documentation

## 2017-11-15 DIAGNOSIS — I4891 Unspecified atrial fibrillation: Secondary | ICD-10-CM | POA: Diagnosis not present

## 2017-11-15 DIAGNOSIS — J449 Chronic obstructive pulmonary disease, unspecified: Secondary | ICD-10-CM | POA: Diagnosis not present

## 2017-11-15 DIAGNOSIS — I480 Paroxysmal atrial fibrillation: Secondary | ICD-10-CM | POA: Insufficient documentation

## 2017-11-15 DIAGNOSIS — I5022 Chronic systolic (congestive) heart failure: Secondary | ICD-10-CM | POA: Insufficient documentation

## 2017-11-15 DIAGNOSIS — Z79899 Other long term (current) drug therapy: Secondary | ICD-10-CM | POA: Insufficient documentation

## 2017-11-15 DIAGNOSIS — I110 Hypertensive heart disease with heart failure: Secondary | ICD-10-CM | POA: Diagnosis not present

## 2017-11-15 DIAGNOSIS — M1990 Unspecified osteoarthritis, unspecified site: Secondary | ICD-10-CM | POA: Diagnosis not present

## 2017-11-15 DIAGNOSIS — G44009 Cluster headache syndrome, unspecified, not intractable: Secondary | ICD-10-CM | POA: Diagnosis not present

## 2017-11-15 DIAGNOSIS — Z87891 Personal history of nicotine dependence: Secondary | ICD-10-CM | POA: Insufficient documentation

## 2017-11-15 DIAGNOSIS — Z7901 Long term (current) use of anticoagulants: Secondary | ICD-10-CM | POA: Diagnosis not present

## 2017-11-15 DIAGNOSIS — F419 Anxiety disorder, unspecified: Secondary | ICD-10-CM | POA: Diagnosis not present

## 2017-11-15 DIAGNOSIS — G4733 Obstructive sleep apnea (adult) (pediatric): Secondary | ICD-10-CM | POA: Diagnosis not present

## 2017-11-15 DIAGNOSIS — F329 Major depressive disorder, single episode, unspecified: Secondary | ICD-10-CM | POA: Insufficient documentation

## 2017-11-15 DIAGNOSIS — I499 Cardiac arrhythmia, unspecified: Secondary | ICD-10-CM | POA: Insufficient documentation

## 2017-11-15 DIAGNOSIS — Z955 Presence of coronary angioplasty implant and graft: Secondary | ICD-10-CM | POA: Diagnosis not present

## 2017-11-15 DIAGNOSIS — Z7982 Long term (current) use of aspirin: Secondary | ICD-10-CM | POA: Insufficient documentation

## 2017-11-15 DIAGNOSIS — Z9889 Other specified postprocedural states: Secondary | ICD-10-CM | POA: Diagnosis not present

## 2017-11-15 DIAGNOSIS — N182 Chronic kidney disease, stage 2 (mild): Secondary | ICD-10-CM | POA: Insufficient documentation

## 2017-11-15 DIAGNOSIS — R600 Localized edema: Secondary | ICD-10-CM | POA: Insufficient documentation

## 2017-11-15 HISTORY — PX: ATRIAL FIBRILLATION ABLATION: EP1191

## 2017-11-15 LAB — POCT ACTIVATED CLOTTING TIME
ACTIVATED CLOTTING TIME: 246 s
ACTIVATED CLOTTING TIME: 318 s
Activated Clotting Time: 340 seconds

## 2017-11-15 SURGERY — ATRIAL FIBRILLATION ABLATION
Anesthesia: General

## 2017-11-15 MED ORDER — HEPARIN SODIUM (PORCINE) 1000 UNIT/ML IJ SOLN
INTRAMUSCULAR | Status: DC | PRN
  Administered 2017-11-15: 14000 [IU] via INTRAVENOUS
  Administered 2017-11-15: 8000 [IU] via INTRAVENOUS
  Administered 2017-11-15: 2000 [IU] via INTRAVENOUS

## 2017-11-15 MED ORDER — SODIUM CHLORIDE 0.9 % IV SOLN
INTRAVENOUS | Status: DC | PRN
  Administered 2017-11-15: 40 ug/min via INTRAVENOUS

## 2017-11-15 MED ORDER — HEPARIN (PORCINE) IN NACL 1000-0.9 UT/500ML-% IV SOLN
INTRAVENOUS | Status: AC
  Filled 2017-11-15: qty 500

## 2017-11-15 MED ORDER — SODIUM CHLORIDE 0.9% FLUSH: 3.0000 mL | INTRAVENOUS | Status: DC | PRN

## 2017-11-15 MED ORDER — PROPOFOL 10 MG/ML IV BOLUS
INTRAVENOUS | Status: DC | PRN
  Administered 2017-11-15: 110 mg via INTRAVENOUS

## 2017-11-15 MED ORDER — HEPARIN SODIUM (PORCINE) 1000 UNIT/ML IJ SOLN
INTRAMUSCULAR | Status: DC | PRN
  Administered 2017-11-15: 1000 [IU] via INTRAVENOUS

## 2017-11-15 MED ORDER — SUGAMMADEX SODIUM 200 MG/2ML IV SOLN
INTRAVENOUS | Status: DC | PRN
  Administered 2017-11-15: 100 mg via INTRAVENOUS

## 2017-11-15 MED ORDER — BUPIVACAINE HCL (PF) 0.25 % IJ SOLN
INTRAMUSCULAR | Status: AC
  Filled 2017-11-15: qty 30

## 2017-11-15 MED ORDER — LIDOCAINE HCL (CARDIAC) PF 100 MG/5ML IV SOSY
PREFILLED_SYRINGE | INTRAVENOUS | Status: DC | PRN
  Administered 2017-11-15: 60 mg via INTRAVENOUS

## 2017-11-15 MED ORDER — HEPARIN SODIUM (PORCINE) 1000 UNIT/ML IJ SOLN
INTRAMUSCULAR | Status: AC
  Filled 2017-11-15: qty 1

## 2017-11-15 MED ORDER — SODIUM CHLORIDE 0.9% FLUSH: 3.0000 mL | Freq: Two times a day (BID) | INTRAVENOUS | Status: DC

## 2017-11-15 MED ORDER — ONDANSETRON HCL 4 MG/2ML IJ SOLN: 4.0000 mg | Freq: Four times a day (QID) | INTRAMUSCULAR | Status: DC | PRN

## 2017-11-15 MED ORDER — SODIUM CHLORIDE 0.9 % IV SOLN: 250.0000 mL | INTRAVENOUS | Status: DC | PRN

## 2017-11-15 MED ORDER — PHENYLEPHRINE HCL 10 MG/ML IJ SOLN
INTRAMUSCULAR | Status: DC | PRN
  Administered 2017-11-15: 80 ug via INTRAVENOUS

## 2017-11-15 MED ORDER — HEPARIN (PORCINE) IN NACL 1000-0.9 UT/500ML-% IV SOLN
INTRAVENOUS | Status: DC | PRN
  Administered 2017-11-15 (×5): 500 mL

## 2017-11-15 MED ORDER — FENTANYL CITRATE (PF) 250 MCG/5ML IJ SOLN
INTRAMUSCULAR | Status: DC | PRN
  Administered 2017-11-15: 100 ug via INTRAVENOUS
  Administered 2017-11-15: 50 ug via INTRAVENOUS

## 2017-11-15 MED ORDER — DOBUTAMINE IN D5W 4-5 MG/ML-% IV SOLN
INTRAVENOUS | Status: AC
  Filled 2017-11-15: qty 250

## 2017-11-15 MED ORDER — ACETAMINOPHEN 325 MG PO TABS
650.0000 mg | ORAL_TABLET | ORAL | Status: DC | PRN
  Filled 2017-11-15: qty 2

## 2017-11-15 MED ORDER — PROTAMINE SULFATE 10 MG/ML IV SOLN
INTRAVENOUS | Status: DC | PRN
  Administered 2017-11-15: 50 mg via INTRAVENOUS

## 2017-11-15 MED ORDER — BUPIVACAINE HCL (PF) 0.25 % IJ SOLN
INTRAMUSCULAR | Status: DC | PRN
  Administered 2017-11-15: 30 mL

## 2017-11-15 MED ORDER — DOBUTAMINE IN D5W 4-5 MG/ML-% IV SOLN
INTRAVENOUS | Status: DC | PRN
  Administered 2017-11-15: 20 ug/kg/min via INTRAVENOUS

## 2017-11-15 MED ORDER — ROCURONIUM BROMIDE 100 MG/10ML IV SOLN
INTRAVENOUS | Status: DC | PRN
  Administered 2017-11-15: 50 mg via INTRAVENOUS

## 2017-11-15 MED ORDER — MIDAZOLAM HCL 5 MG/5ML IJ SOLN
INTRAMUSCULAR | Status: DC | PRN
  Administered 2017-11-15: 2 mg via INTRAVENOUS

## 2017-11-15 MED ORDER — SODIUM CHLORIDE 0.9 % IV SOLN
INTRAVENOUS | Status: DC
  Administered 2017-11-15: 06:00:00 via INTRAVENOUS

## 2017-11-15 MED ORDER — ONDANSETRON HCL 4 MG/2ML IJ SOLN
INTRAMUSCULAR | Status: DC | PRN
  Administered 2017-11-15: 4 mg via INTRAVENOUS

## 2017-11-15 SURGICAL SUPPLY — 19 items
BLANKET WARM UNDERBOD FULL ACC (MISCELLANEOUS) ×2 IMPLANT
CATH MAPPNG PENTARAY F 2-6-2MM (CATHETERS) ×1 IMPLANT
CATH SMTCH THERMOCOOL SF DF (CATHETERS) ×2 IMPLANT
CATH SOUNDSTAR 3D IMAGING (CATHETERS) ×2 IMPLANT
CATH WEBSTER BI DIR CS D-F CRV (CATHETERS) ×2 IMPLANT
COVER SWIFTLINK CONNECTOR (BAG) ×2 IMPLANT
PACK EP LATEX FREE (CUSTOM PROCEDURE TRAY) ×1
PACK EP LF (CUSTOM PROCEDURE TRAY) ×1 IMPLANT
PAD PRO RADIOLUCENT 2001M-C (PAD) ×2 IMPLANT
PATCH CARTO3 (PAD) ×2 IMPLANT
PENTARAY F 2-6-2MM (CATHETERS) ×2
SHEATH AVANTI 11F 11CM (SHEATH) ×2 IMPLANT
SHEATH BAYLIS SUREFLEX  M 8.5 (SHEATH) ×2
SHEATH BAYLIS SUREFLEX M 8.5 (SHEATH) ×2 IMPLANT
SHEATH BAYLIS TRANSSEPTAL 98CM (NEEDLE) ×2 IMPLANT
SHEATH PINNACLE 7F 10CM (SHEATH) ×2 IMPLANT
SHEATH PINNACLE 8F 10CM (SHEATH) ×4 IMPLANT
SHEATH PINNACLE 9F 10CM (SHEATH) ×4 IMPLANT
TUBING SMART ABLATE COOLFLOW (TUBING) ×4 IMPLANT

## 2017-11-15 NOTE — Anesthesia Procedure Notes (Signed)

## 2017-11-15 NOTE — Transfer of Care (Signed)
Immediate Anesthesia Transfer of Care Note  Patient: Angela Shepherd  Procedure(s) Performed: ATRIAL FIBRILLATION ABLATION (N/A )  Patient Location: Cath Lab  Anesthesia Type:General  Level of Consciousness: awake, alert , oriented and patient cooperative  Airway & Oxygen Therapy: Patient Spontanous Breathing and Patient connected to nasal cannula oxygen  Post-op Assessment: Report given to RN and Post -op Vital signs reviewed and stable  Post vital signs: Reviewed and stable  Last Vitals:  Vitals Value Taken Time  BP 84/63 11/15/2017 10:44 AM  Temp 36 C 11/15/2017 10:42 AM  Pulse 68 11/15/2017 10:46 AM  Resp 15 11/15/2017 10:46 AM  SpO2 94 % 11/15/2017 10:46 AM  Vitals shown include unvalidated device data.  Last Pain:  Vitals:   11/15/17 1042  TempSrc: Temporal  PainSc: 0-No pain         Complications: No apparent anesthesia complications

## 2017-11-15 NOTE — Anesthesia Postprocedure Evaluation (Signed)
Anesthesia Post Note  Patient: Angela Shepherd  Procedure(s) Performed: ATRIAL FIBRILLATION ABLATION (N/A )     Patient location during evaluation: PACU Anesthesia Type: General Level of consciousness: awake and alert Pain management: pain level controlled Vital Signs Assessment: post-procedure vital signs reviewed and stable Respiratory status: spontaneous breathing, nonlabored ventilation, respiratory function stable and patient connected to nasal cannula oxygen Cardiovascular status: blood pressure returned to baseline and stable Postop Assessment: no apparent nausea or vomiting Anesthetic complications: no    Last Vitals:  Vitals:   11/15/17 1230 11/15/17 1245  BP: (!) 80/45 (!) 86/55  Pulse: 64 65  Resp: 18 (!) 21  Temp:    SpO2: 95% 98%    Last Pain:  Vitals:   11/15/17 1148  TempSrc: Oral  PainSc: 0-No pain                 Rainee Sweatt

## 2017-11-15 NOTE — H&P (Signed)
Angela Shepherd has presented today for surgery, with the diagnosis of atrial fibrillation.  The various methods of treatment have been discussed with the patient and family. After consideration of risks, benefits and other options for treatment, the patient has consented to  Procedure(s): Catheter ablation as a surgical intervention .  Risks include but not limited to bleeding, tamponade, heart block, stroke, damage to surrounding organs, among others. The patient's history has been reviewed, patient examined, no change in status, stable for surgery.  I have reviewed the patient's chart and labs.  Questions were answered to the patient's satisfaction.    Mitsuko Luera Curt Bears, MD 11/15/2017 7:07 AM

## 2017-11-15 NOTE — Discharge Instructions (Signed)
Post procedure care instructions No driving for 4 days. No lifting over 5 lbs for 1 week. No vigorous or sexual activity for 1 week. You may return to work on 11/22/17. Keep procedure site clean & dry. If you notice increased pain, swelling, bleeding or pus, call/return!  You may shower, but no soaking baths/hot tubs/pools for 1 week.    Femoral Site Care Refer to this sheet in the next few weeks. These instructions provide you with information about caring for yourself after your procedure. Your health care provider may also give you more specific instructions. Your treatment has been planned according to current medical practices, but problems sometimes occur. Call your health care provider if you have any problems or questions after your procedure. What can I expect after the procedure? After your procedure, it is typical to have the following:  Bruising at the site that usually fades within 1-2 weeks.  Blood collecting in the tissue (hematoma) that may be painful to the touch. It should usually decrease in size and tenderness within 1-2 weeks.  Follow these instructions at home:  Take medicines only as directed by your health care provider.  You may shower 24-48 hours after the procedure or as directed by your health care provider. Remove the bandage (dressing) and gently wash the site with plain soap and water. Pat the area dry with a clean towel. Do not rub the site, because this may cause bleeding.  Do not take baths, swim, or use a hot tub until your health care provider approves.  Check your insertion site every day for redness, swelling, or drainage.  Do not apply powder or lotion to the site.  Limit use of stairs to twice a day for the first 2-3 days or as directed by your health care provider.  Do not squat for the first 2-3 days or as directed by your health care provider.  Do not lift over 10 lb (4.5 kg) for 5 days after your procedure or as directed by your health care  provider.  Ask your health care provider when it is okay to: ? Return to work or school. ? Resume usual physical activities or sports. ? Resume sexual activity.  Do not drive home if you are discharged the same day as the procedure. Have someone else drive you.  You may drive 24 hours after the procedure unless otherwise instructed by your health care provider.  Do not operate machinery or power tools for 24 hours after the procedure or as directed by your health care provider.  If your procedure was done as an outpatient procedure, which means that you went home the same day as your procedure, a responsible adult should be with you for the first 24 hours after you arrive home.  Keep all follow-up visits as directed by your health care provider. This is important. Contact a health care provider if:  You have a fever.  You have chills.  You have increased bleeding from the site. Hold pressure on the site. Get help right away if:  You have unusual pain at the site.  You have redness, warmth, or swelling at the site.  You have drainage (other than a small amount of blood on the dressing) from the site.  The site is bleeding, and the bleeding does not stop after 30 minutes of holding steady pressure on the site.  Your leg or foot becomes pale, cool, tingly, or numb. This information is not intended to replace advice given to you  by your health care provider. Make sure you discuss any questions you have with your health care provider. Document Released: 08/28/2013 Document Revised: 06/02/2015 Document Reviewed: 07/14/2013 Elsevier Interactive Patient Education  [employer].

## 2017-11-15 NOTE — Progress Notes (Signed)
Site area: rt and left groins fv sheaths Site Prior to Removal:  Level  0 ; left fv sheaths pulled and pressure held by Tammy Mink Pressure Applied For:  20 minutes bilaterally Manual:   yes Patient Status During Pull:  stable Post Pull Site:  Level  0 Post Pull Instructions Given:  yes Post Pull Pulses Present: rt and left dp  palpable Dressing Applied:  Gauze and tegaderm Bedrest begins @ 1130 Comments:  IV saline locked

## 2017-11-18 ENCOUNTER — Ambulatory Visit (INDEPENDENT_AMBULATORY_CARE_PROVIDER_SITE_OTHER): Payer: 59 | Admitting: *Deleted

## 2017-11-18 ENCOUNTER — Encounter (HOSPITAL_COMMUNITY): Payer: Self-pay | Admitting: Cardiology

## 2017-11-18 DIAGNOSIS — I5022 Chronic systolic (congestive) heart failure: Secondary | ICD-10-CM | POA: Diagnosis not present

## 2017-11-18 DIAGNOSIS — I428 Other cardiomyopathies: Secondary | ICD-10-CM

## 2017-11-18 DIAGNOSIS — J449 Chronic obstructive pulmonary disease, unspecified: Secondary | ICD-10-CM | POA: Diagnosis not present

## 2017-11-18 LAB — POCT ACTIVATED CLOTTING TIME: Activated Clotting Time: 158 seconds

## 2017-11-18 MED FILL — CARVEDILOL 12.5 MG TABLET: 12.5 | 30 days supply | Qty: 60 | Fill #3

## 2017-11-19 NOTE — Progress Notes (Signed)
Remote pacemaker transmission.   

## 2017-11-25 ENCOUNTER — Other Ambulatory Visit: Payer: Self-pay | Admitting: Family Medicine

## 2017-11-25 MED FILL — ALPRAZolam 0.25 MG TABS: 0.25 | 15 days supply | Qty: 30 | Fill #3

## 2017-11-25 MED FILL — FLUoxetine HCL 20 MG CAPS: 20 | 30 days supply | Qty: 30 | Fill #0

## 2017-11-25 MED FILL — ELIQUIS 5 MG TABLET: 5 | 30 days supply | Qty: 60 | Fill #1

## 2017-11-25 MED FILL — TORSEMIDE 20 MG TABLET: 20 | 30 days supply | Qty: 60 | Fill #3

## 2017-11-27 DIAGNOSIS — G4733 Obstructive sleep apnea (adult) (pediatric): Secondary | ICD-10-CM | POA: Diagnosis not present

## 2017-11-29 ENCOUNTER — Ambulatory Visit: Payer: 59

## 2017-12-11 DIAGNOSIS — G4733 Obstructive sleep apnea (adult) (pediatric): Secondary | ICD-10-CM | POA: Diagnosis not present

## 2017-12-13 ENCOUNTER — Other Ambulatory Visit (HOSPITAL_COMMUNITY): Payer: Self-pay | Admitting: Internal Medicine

## 2017-12-13 ENCOUNTER — Institutional Professional Consult (permissible substitution): Payer: 59 | Admitting: Pulmonary Disease

## 2017-12-19 ENCOUNTER — Encounter (HOSPITAL_COMMUNITY): Payer: Self-pay | Admitting: Nurse Practitioner

## 2017-12-19 ENCOUNTER — Ambulatory Visit (HOSPITAL_COMMUNITY)
Admission: RE | Admit: 2017-12-19 | Discharge: 2017-12-19 | Disposition: A | Discharge status: Home / Self Care | Payer: 59 | Source: Ambulatory Visit | Attending: Nurse Practitioner | Admitting: Nurse Practitioner

## 2017-12-19 VITALS — BP 112/66 | HR 73 | Ht 62.0 in | Wt 124.0 lb

## 2017-12-19 DIAGNOSIS — G4730 Sleep apnea, unspecified: Secondary | ICD-10-CM | POA: Diagnosis not present

## 2017-12-19 DIAGNOSIS — Z79899 Other long term (current) drug therapy: Secondary | ICD-10-CM | POA: Diagnosis not present

## 2017-12-19 DIAGNOSIS — Z7901 Long term (current) use of anticoagulants: Secondary | ICD-10-CM | POA: Diagnosis not present

## 2017-12-19 DIAGNOSIS — I5022 Chronic systolic (congestive) heart failure: Secondary | ICD-10-CM | POA: Insufficient documentation

## 2017-12-19 DIAGNOSIS — Z87891 Personal history of nicotine dependence: Secondary | ICD-10-CM | POA: Diagnosis not present

## 2017-12-19 DIAGNOSIS — I480 Paroxysmal atrial fibrillation: Secondary | ICD-10-CM | POA: Insufficient documentation

## 2017-12-19 DIAGNOSIS — I4819 Other persistent atrial fibrillation: Secondary | ICD-10-CM | POA: Diagnosis not present

## 2017-12-19 DIAGNOSIS — I110 Hypertensive heart disease with heart failure: Secondary | ICD-10-CM | POA: Diagnosis not present

## 2017-12-19 DIAGNOSIS — Z7982 Long term (current) use of aspirin: Secondary | ICD-10-CM | POA: Diagnosis not present

## 2017-12-19 NOTE — Progress Notes (Signed)
Primary Care Physician: Midge Minium, MD Referring Physician:Dr. Marykay Lex Angela Shepherd is a 67 y.o. female with a h/o afib that is in the afib clinic for f/u ablation one month ago. She appears to be in SR. Has not noticed much irregular heart beat. No swallowing or groin issues  Today, she denies symptoms of palpitations, chest pain, shortness of breath, orthopnea, PND, lower extremity edema, dizziness, presyncope, syncope, or neurologic sequela. The patient is tolerating medications without difficulties and is otherwise without complaint today.   Past Medical History:  Diagnosis Date  . Bradycardia    a. initial PPM placed 1994 at [facility] with gen change 2002. b. 1*AVB & intermittent 2nd degree AVB + CHF --> upgrade to [facility]. Jude BiV PPM 05/08/12.  . Chronic systolic CHF (congestive heart failure) ([employer])    a. EF 30-35% dx in 2011 -> most recent 40-45% by echo 04/2012. b. s/p upgrade to BiV-PPM 05/08/12. c. Med titration limited by hypotension.  . Cluster headache syndrome   . Hypertr obst cardiomyop   . Hypokalemia   . Hypotension   . Paroxysmal atrial fibrillation (HCC)    a. identified on PPM interrogation b. longest episode 1 hour; if burden increases, will need [place]   . Pneumonia 2008  . Sleep apnea    Past Surgical History:  Procedure Laterality Date  . ATRIAL FIBRILLATION ABLATION N/A 11/15/2017   Procedure: ATRIAL FIBRILLATION ABLATION;  Surgeon: Constance Haw, MD;  Location: [facility];  Service: Cardiovascular;  Laterality: N/A;  . BI-VENTRICULAR PACEMAKER UPGRADE N/A 05/08/2012   upgrade to S[facility] Anthem (CRT-P) by Dr Rayann Heman  . CARDIOVERSION N/A 09/19/2017   Procedure: CARDIOVERSION;  Surgeon: Sanda Klein, MD;  Location: [facility];  Service: Cardiovascular;  Laterality: N/A;  . [place]; 2002; 05/08/2012  . TONSILLECTOMY  ~ 1959  . TUBAL LIGATION  1984?    Current Outpatient Medications  Medication Sig Dispense Refill  .  allopurinol (ZYLOPRIM) 100 MG tablet Take 0.5 tablets (50 mg total) by mouth daily. (Patient taking differently: Take 50 mg by mouth at bedtime. ) 15 tablet 2  . ALPRAZolam (XANAX) 0.25 MG tablet TAKE 1 TABLET (0.25 MG TOTAL) BY MOUTH 2 (TWO) TIMES DAILY AS NEEDED FOR ANXIETY. 30 tablet 3  . AMBULATORY NON FORMULARY MEDICATION Medication Name: 100% O2 15 leters a min for 15-20 mins  Via Nasal cannual 1 Bottle 2  . amitriptyline (ELAVIL) 10 MG tablet Take 1 tablet (10 mg total) by mouth at bedtime. (Patient taking differently: Take 10 mg by mouth daily as needed (for cluster headaches.). ) 30 tablet 3  . aspirin 81 MG chewable tablet Chew 81 mg by mouth daily.    . budesonide-formoterol (SYMBICORT) 160-4.5 MCG/ACT inhaler INHALE 2 PUFFS BY MOUTH INTO THE LUNGS 2 TIMES A DAY (Patient taking differently: Inhale 2 puffs into the lungs 2 (two) times daily. INHALE 2 PUFFS BY MOUTH INTO THE LUNGS 2 TIMES A DAY) 3 Inhaler 1  . carvedilol (COREG) 12.5 MG tablet TAKE 1 TABLET BY MOUTH 2 TIMES DAILY WITH A MEAL. 60 tablet 3  . colchicine 0.6 MG tablet Take 1 tablet (0.6 mg total) by mouth 2 (two) times daily. (Patient taking differently: Take 0.6 mg by mouth 2 (two) times daily as needed (for gout flare ups.). ) 30 tablet 0  . cyclobenzaprine (FLEXERIL) 5 MG tablet TAKE 1 TABLET (5 MG TOTAL) BY MOUTH 3 (THREE) TIMES DAILY AS NEEDED FOR MUSCLE SPASMS. (  Patient taking differently: Take 5 mg by mouth 3 (three) times daily as needed for muscle spasms. ) 30 tablet 2  . diclofenac sodium (VOLTAREN) 1 % GEL Apply 2 g topically 4 (four) times daily. (Patient taking differently: Apply 2 g topically 4 (four) times daily as needed (pain.). ) 3 Tube 0  . ELIQUIS 5 MG TABS tablet TAKE 1 TABLET BY MOUTH TWICE DAILY (Patient taking differently: Take 5 mg by mouth 2 (two) times daily. ) 60 tablet 5  . FLUoxetine (PROZAC) 20 MG capsule TAKE 1 CAPSULE (20 MG TOTAL) BY MOUTH DAILY. 30 capsule 3  . fluticasone (FLONASE) 50 MCG/ACT  nasal spray PLACE 2 SPRAYS INTO BOTH NOSTRILS DAILY. 16 g 5  . ipratropium-albuterol (DUONEB) 0.5-2.5 (3) MG/3ML SOLN USE 1 VIAL (3ML) BY NEBULIZER EVERY 4 TO 6 HOURS AS NEEDED (Patient taking differently: Inhale 3 mLs into the lungs every 4 (four) hours as needed (for wheezing/shortness of breath.). ) 270 mL 1  . losartan (COZAAR) 25 MG tablet Take 1 tablet (25 mg total) by mouth daily. (Patient taking differently: Take 25 mg by mouth daily as needed (for systolic blood pressure greater than 100). ) 30 tablet 11  . montelukast (SINGULAIR) 10 MG tablet Take 1 tablet (10 mg total) by mouth at bedtime. 30 tablet 5  . nicotine (NICODERM CQ - DOSED IN MG/24 HOURS) 14 mg/24hr patch Place 1 patch (14 mg total) onto the skin daily. (Patient taking differently: Place 14 mg onto the skin daily as needed (for smoking urges/cravings.). ) 28 patch 0  . potassium chloride SA (K-DUR,KLOR-CON) 20 MEQ tablet TAKE 1 TABLET (20 MEQ TOTAL) BY MOUTH DAILY. (Patient taking differently: Take 20 mEq by mouth at bedtime. ) 90 tablet 1  . Spacer/Aero-Holding Chambers (AEROCHAMBER MV) inhaler Use as instructed with Symbicort 1 each 0  . SUMAtriptan (IMITREX) 50 MG tablet TAKE 1 TABLET AT ONSET OF HEADACHE. MAY REPEAT IN 2 HOURS IF HEADACHE PERSISTS OR RECURS. (Patient taking differently: Take 50 mg by mouth every 2 (two) hours as needed for migraine. Take 1 tablet at onset of headache. May repeat in 2 hours if headache persists or recurs.) 10 tablet 6  . Tiotropium Bromide Monohydrate (SPIRIVA RESPIMAT) 2.5 MCG/ACT AERS Inhale 2 puffs into the lungs daily. 3 Inhaler 1  . topiramate (TOPAMAX) 50 MG tablet Take 50 mg by mouth daily as needed (migraine headaches).     . torsemide (DEMADEX) 20 MG tablet Take 1 tablet (20 mg total) by mouth 2 (two) times daily. 60 tablet 5  . traMADol (ULTRAM) 50 MG tablet Take 0.5 tablets (25 mg total) by mouth every 12 (twelve) hours as needed. 12 tablet 0  . VENTOLIN HFA 108 (90 Base) MCG/ACT  inhaler INHALE 1-2 PUFFS INTO THE LUNGS EVERY 6 (SIX) HOURS AS NEEDED FOR WHEEZING OR SHORTNESS OF BREATH. (Patient taking differently: Inhale 1-2 puffs into the lungs every 6 (six) hours as needed for wheezing or shortness of breath. ) 1 Inhaler 5   No current facility-administered medications for this encounter.     Allergies  Allergen Reactions  . Prednisone Shortness Of Breath    CHF     Social History   Socioeconomic History  . Marital status: Widowed    Spouse name: Not on file  . Number of children: 2  . Years of education: Not on file  . Highest education level: Not on file  Occupational History  . Occupation: [place]    Employer: [employer]  Social Needs  . Financial resource strain: Not on file  . Food insecurity:    Worry: Not on file    Inability: Not on file  . Transportation needs:    Medical: Not on file    Non-medical: Not on file  Tobacco Use  . Smoking status: Former Smoker    Packs/day: 0.10    Years: 30.00    Pack years: 3.00    Last attempt to quit: 01/08/2010    Years since quitting: 7.9  . Smokeless tobacco: Never Used  Substance and Sexual Activity  . Alcohol use: Yes    Alcohol/week: 2.0 standard drinks    Types: 2 Glasses of wine per week  . Drug use: No  . Sexual activity: Never  Lifestyle  . Physical activity:    Days per week: Not on file    Minutes per session: Not on file  . Stress: Not on file  Relationships  . Social connections:    Talks on phone: Not on file    Gets together: Not on file    Attends religious service: Not on file    Active member of club or organization: Not on file    Attends meetings of clubs or organizations: Not on file    Relationship status: Not on file  . Intimate partner violence:    Fear of current or ex partner: Not on file    Emotionally abused: Not on file    Physically abused: Not on file    Forced sexual activity: Not on file  Other Topics Concern  . Not on file  Social  History Narrative   ** Merged History Encounter **        Family History  Problem Relation Age of Onset  . Heart disease Sister   . Dementia Mother   . Cancer Sister 27       uterine  . Diabetes Unknown   . Cardiomyopathy Sister   . Diabetes Brother   . Colon cancer Neg Hx   . Esophageal cancer Neg Hx   . Stomach cancer Neg Hx   . Rectal cancer Neg Hx     ROS- All systems are reviewed and negative except as per the HPI above  Physical Exam: Vitals:   12/19/17 1541  BP: 112/66  Pulse: 73  Weight: 56.2 kg  Height: 5\' 2"  (1.575 m)   Wt Readings from Last 3 Encounters:  12/19/17 56.2 kg  11/15/17 54.9 kg  11/04/17 54.4 kg    Labs: Lab Results  Component Value Date   NA 144 11/01/2017   K 4.3 11/01/2017   CL 101 11/01/2017   CO2 26 11/01/2017   GLUCOSE 99 11/01/2017   BUN 21 11/01/2017   CREATININE 1.11 (H) 11/01/2017   CALCIUM 9.8 11/01/2017   PHOS 2.0 (L) 06/24/2017   MG 2.2 06/24/2017   Lab Results  Component Value Date   INR 1.06 09/18/2017   Lab Results  Component Value Date   CHOL 179 06/21/2017   HDL 65 06/21/2017   LDLCALC 106 (H) 06/21/2017   TRIG 42 06/21/2017     GEN- The patient is well appearing, alert and oriented x 3 today.   Head- normocephalic, atraumatic Eyes-  Sclera clear, conjunctiva pink Ears- hearing intact Oropharynx- clear Neck- supple, no JVP Lymph- no cervical lymphadenopathy Lungs- Clear to ausculation bilaterally, normal work of breathing Heart- Regular rate and rhythm, no murmurs, rubs or gallops, PMI not laterally displaced GI- soft, NT, ND, +  BS Extremities- no clubbing, cyanosis, or edema MS- no significant deformity or atrophy Skin- no rash or lesion Psych- euthymic mood, full affect Neuro- strength and sensation are intact  EKG-atrial sensed, v paced at 73 bpm  Epic records reviewed    Assessment and Plan: 1. Afib Doing well after ablation  Continue eliquis 5 mg bid without interruption  Continue  carvedilol at current dose  2. HTN stable  3. PPM Per Dr. Ileana Ladd  F/u with Dr. Curt Bears in February as scheduled  Geroge Baseman. Darion Milewski, [facility] [address] [place], [place] [address] [phone]

## 2017-12-25 ENCOUNTER — Other Ambulatory Visit: Payer: Self-pay | Admitting: Family Medicine

## 2017-12-25 MED FILL — FLUoxetine HCL 20 MG CAPS: 20 | 30 days supply | Qty: 30 | Fill #1

## 2017-12-25 MED FILL — TORSEMIDE 20 MG TABLET: 20 | 30 days supply | Qty: 60 | Fill #4

## 2017-12-25 NOTE — Telephone Encounter (Signed)
Last OV 06/12/17 Alprazolam last filled 08/28/17 #30 with 3

## 2017-12-26 ENCOUNTER — Ambulatory Visit (INDEPENDENT_AMBULATORY_CARE_PROVIDER_SITE_OTHER): Payer: 59 | Admitting: Pulmonary Disease

## 2017-12-26 ENCOUNTER — Encounter: Payer: Self-pay | Admitting: Pulmonary Disease

## 2017-12-26 ENCOUNTER — Telehealth: Payer: Self-pay | Admitting: Pulmonary Disease

## 2017-12-26 VITALS — BP 106/71 | HR 69 | Ht 62.0 in | Wt 123.0 lb

## 2017-12-26 DIAGNOSIS — J432 Centrilobular emphysema: Secondary | ICD-10-CM | POA: Diagnosis not present

## 2017-12-26 DIAGNOSIS — R918 Other nonspecific abnormal finding of lung field: Secondary | ICD-10-CM | POA: Diagnosis not present

## 2017-12-26 DIAGNOSIS — G4733 Obstructive sleep apnea (adult) (pediatric): Secondary | ICD-10-CM | POA: Diagnosis not present

## 2017-12-26 DIAGNOSIS — J449 Chronic obstructive pulmonary disease, unspecified: Secondary | ICD-10-CM | POA: Diagnosis not present

## 2017-12-26 MED FILL — ALPRAZolam 0.25 MG TABS: 0.25 | 15 days supply | Qty: 30 | Fill #0

## 2017-12-26 NOTE — Telephone Encounter (Signed)
Patient sees RB for COPD at the [facility] location, and is now using cpap machine. Pt would like to be seen at the [facility] location as it is more convenient for her. Per protocol, I advised the pt that we will advise RB and VS if this is okay with both to switch  Pt would like to been seen by VS in [facility] location for both sleep and pulmonary.  RB please advise if you are okay with this switch? VS please advise if you are okay with this switch? Thank you.

## 2017-12-26 NOTE — Progress Notes (Signed)
   Subjective:    Patient ID: Angela Shepherd, female    DOB: Aug 06, 1950, 67 y.o.   MRN: [id number]  HPI    Review of Systems  Constitutional: Negative for fever and unexpected weight change.  HENT: Positive for congestion. Negative for dental problem, ear pain, nosebleeds, postnasal drip, rhinorrhea, sinus pressure, sneezing, sore throat and trouble swallowing.   Eyes: Negative for redness and itching.  Respiratory: Positive for cough and shortness of breath. Negative for chest tightness and wheezing.   Cardiovascular: Positive for palpitations. Negative for leg swelling.  Gastrointestinal: Negative for nausea and vomiting.  Genitourinary: Negative for dysuria.  Musculoskeletal: Positive for joint swelling.  Skin: Negative for rash.  Allergic/Immunologic: Negative for environmental allergies, food allergies and immunocompromised state.  Neurological: Positive for headaches.  Hematological: Bruises/bleeds easily.  Psychiatric/Behavioral: Negative for dysphoric mood. The patient is nervous/anxious.        Objective:   Physical Exam        Assessment & Plan:

## 2017-12-26 NOTE — Progress Notes (Signed)
[facility]  Chief Complaint  Patient presents with  . sleep consult    Pt is currently using CPAP machine in last 2 months. She is having hard time with mask fit and leaking. DME-[employer].    Constitutional:  BP 106/71 (BP Location: Left Arm, Cuff Size: Normal)   Pulse 69   Ht 5\' 2"  (1.575 m)   Wt 123 lb (55.8 kg)   LMP  (LMP Unknown)   SpO2 99%   BMI 22.50 kg/m   Past Medical History:  PNA, Atrial fibrillation, Systolic CHF, Cluster headaches, Bradycardia s/p PM, Hypertrophic cardiomyopathy  Brief Summary:  NA WALDRIP is a 67 y.o. female former smoker with COPD/emphysema and obstructive sleep apnea.  She has been followed by Dr. Lamonte Sakai.  She has request to have her care transferred to [facility] office.  She had home sleep study in August.  Found to have moderate sleep apnea with significant oxygen desaturation.  She had in lab titration study in September.  Optimal setting was with CPAP 8 cm H2O.  She developed centrals with higher pressure settings.    She has been trying to use CPAP, but has trouble with mask fit and hasn't been able to adjust to pressure setting yet.  She felt that mask used in the sleep lab was most comfortable.  The mask she got a home was way too big.  She gets cough and wheezing in the morning, and this gets better after she uses symbicort and spiriva around 6 am.  She also takes symbicort around 6 pm.  Her breathing is okay for the most part during the rest of the day.    She had terrible pneumonia years ago and this resulted in pleural thickening and calcification around her right lung.  Cardiac CT from 11/11/17 showed emphysema, Rt lung scarring, new 4 mm nodule in RLL, new 3 mm nodule LUL.  Physical Exam:   Appearance - well kempt   ENMT - clear nasal mucosa, midline nasal  septum, no oral exudates, no LAN, trachea midline  Respiratory - normal chest wall, normal respiratory effort, no  accessory muscle use, no wheeze/rales  CV - s1s2 regular rate and rhythm, no murmurs, no peripheral edema, radial pulses symmetric  GI - soft, non tender, no masses  Lymph - no adenopathy noted in neck and axillary areas  MSK - normal gait  Ext - no cyanosis, clubbing, or joint inflammation noted  Skin - no rashes, lesions, or ulcers  Neuro - normal strength, oriented x 3  Psych - normal mood and affect   Assessment/Plan:   COPD with emphysema. - continue symbicort - advised her to try using spiriva at night to determine if this improves her early morning symptoms - prn ventolin  Obstructive sleep apnea. - reviewed her sleep study with her - explained how untreated sleep apnea can impact her health - continue CPAP 8 cm H2O - will arrange for CPAP mask refitting - will arrange for ONO with CPAP - explained she might need repeat in lab titration study if not able to adjust to current set up at home  Lung nodules. - she will need f/u CT chest w/o contrast in July 8546  Chronic systolic CHF, Atrial fibrillation. - followed by cardiology   Patient Instructions  Will arrange for refitting of your CPAP mask and for overnight oxygen test with you using CPAP  Follow up in 8 weeks with Dr. Halford Chessman or Nurse Practitioner  [name], MD [facility] Pager: [phone] 12/26/2017, 11:37 AM  Flow Sheet     Pulmonary tests:  PFT 07/29/17 >> FEV1 0.84 (41%), FEV1% 59, DLCO 41%  Chest imaging:  Cardiac CT >> 11/11/17 showed emphysema, Rt lung scarring, new 4 mm nodule in RLL, new 3 mm nodule LUL.  Sleep tests:  HST 08/16/17 >> AHI 16.9, SpO2 low 82%, Spent 308.3 min with SpO2 < 88% CPAP 09/24/17 >> CPAP 8 cm H2O, centrals with higher pressures  Cardiac tests:  Echo 06/21/17 >> EF 35 to 50%, mild AR, mild/mod MR, PAS 41 mmHg   Review of Systems:  Constitutional: Negative for fever and unexpected weight change.  HENT: Positive for congestion.  Negative for dental problem, ear pain, nosebleeds, postnasal drip, rhinorrhea, sinus pressure, sneezing, sore throat and trouble swallowing.   Eyes: Negative for redness and itching.  Respiratory: Positive for cough and shortness of breath. Negative for chest tightness and wheezing.   Cardiovascular: Positive for palpitations. Negative for leg swelling.  Gastrointestinal: Negative for nausea and vomiting.  Genitourinary: Negative for dysuria.  Musculoskeletal: Positive for joint swelling.  Skin: Negative for rash.  Allergic/Immunologic: Negative for environmental allergies, food allergies and immunocompromised state.  Neurological: Positive for headaches.  Hematological: Bruises/bleeds easily.  Psychiatric/Behavioral: Negative for dysphoric mood. The patient is nervous/anxious.    Medications:   Allergies as of 12/26/2017      Reactions   Prednisone Shortness Of Breath   CHF       Medication List       Accurate as of December 26, 2017 11:37 AM. Always use your most recent med list.        AEROCHAMBER MV inhaler Use as instructed with Symbicort   allopurinol 100 MG tablet Commonly known as:  ZYLOPRIM Take 0.5 tablets (50 mg total) by mouth daily.   ALPRAZolam 0.25 MG tablet Commonly known as:  XANAX TAKE 1 TABLET (0.25 MG TOTAL) BY MOUTH 2 (TWO) TIMES DAILY AS NEEDED FOR ANXIETY.   AMBULATORY NON FORMULARY MEDICATION Medication Name: 100% O2 15 leters a min for 15-20 mins  Via Nasal cannual   amitriptyline 10 MG tablet Commonly known as:  ELAVIL Take 1 tablet (10 mg total) by mouth at bedtime.   aspirin 81 MG chewable tablet Chew 81 mg by mouth daily.   budesonide-formoterol 160-4.5 MCG/ACT inhaler Commonly known as:  SYMBICORT INHALE 2 PUFFS BY MOUTH INTO THE LUNGS 2 TIMES A DAY   carvedilol 12.5 MG tablet Commonly known as:  COREG TAKE 1 TABLET BY MOUTH 2 TIMES DAILY WITH A MEAL.   colchicine 0.6 MG tablet Take 1 tablet (0.6 mg total) by mouth 2 (two) times  daily.   cyclobenzaprine 5 MG tablet Commonly known as:  FLEXERIL TAKE 1 TABLET (5 MG TOTAL) BY MOUTH 3 (THREE) TIMES DAILY AS NEEDED FOR MUSCLE SPASMS.   diclofenac sodium 1 % Gel Commonly known as:  VOLTAREN Apply 2 g topically 4 (four) times daily.   ELIQUIS 5 MG Tabs tablet Generic drug:  apixaban TAKE 1 TABLET BY MOUTH TWICE DAILY   FLUoxetine 20 MG capsule Commonly known as:  PROZAC TAKE 1 CAPSULE (20 MG TOTAL) BY MOUTH DAILY.   fluticasone 50 MCG/ACT nasal spray Commonly known as:  FLONASE PLACE 2 SPRAYS INTO BOTH NOSTRILS DAILY.   ipratropium-albuterol 0.5-2.5 (3) MG/3ML Soln Commonly known as:  DUONEB USE 1 VIAL (3ML) BY NEBULIZER EVERY 4 TO 6 HOURS AS NEEDED   losartan 25 MG tablet Commonly known  as:  COZAAR Take 1 tablet (25 mg total) by mouth daily.   montelukast 10 MG tablet Commonly known as:  SINGULAIR Take 1 tablet (10 mg total) by mouth at bedtime.   nicotine 14 mg/24hr patch Commonly known as:  NICODERM CQ - dosed in mg/24 hours Place 1 patch (14 mg total) onto the skin daily.   potassium chloride SA 20 MEQ tablet Commonly known as:  K-DUR,KLOR-CON TAKE 1 TABLET (20 MEQ TOTAL) BY MOUTH DAILY.   SUMAtriptan 50 MG tablet Commonly known as:  IMITREX TAKE 1 TABLET AT ONSET OF HEADACHE. MAY REPEAT IN 2 HOURS IF HEADACHE PERSISTS OR RECURS.   Tiotropium Bromide Monohydrate 2.5 MCG/ACT Aers Commonly known as:  SPIRIVA RESPIMAT Inhale 2 puffs into the lungs daily.   topiramate 50 MG tablet Commonly known as:  TOPAMAX Take 50 mg by mouth daily as needed (migraine headaches).   torsemide 20 MG tablet Commonly known as:  DEMADEX Take 1 tablet (20 mg total) by mouth 2 (two) times daily.   traMADol 50 MG tablet Commonly known as:  ULTRAM Take 0.5 tablets (25 mg total) by mouth every 12 (twelve) hours as needed.   VENTOLIN HFA 108 (90 Base) MCG/ACT inhaler Generic drug:  albuterol INHALE 1-2 PUFFS INTO THE LUNGS EVERY 6 (SIX) HOURS AS NEEDED FOR  WHEEZING OR SHORTNESS OF BREATH.       Past Surgical History:  She  has a past surgical history that includes Tonsillectomy (~ 1959); Tubal ligation (1984?); Pacemaker insertion (1994; 2002; 05/08/2012); bi-ventricular pacemaker upgrade (N/A, 05/08/2012); Cardioversion (N/A, 09/19/2017); and ATRIAL FIBRILLATION ABLATION (N/A, 11/15/2017).  Family History:  Her family history includes Cancer (age of onset: 66) in her sister; Cardiomyopathy in her sister; Dementia in her mother; Diabetes in her brother and another family member; Heart disease in her sister.  Social History:  She  reports that she quit smoking about 7 years ago. Her smoking use included cigarettes. She has a 15.00 pack-year smoking history. She has never used smokeless tobacco. She reports current alcohol use of about 2.0 standard drinks of alcohol per week. She reports that she does not use drugs.

## 2017-12-26 NOTE — Telephone Encounter (Signed)
I am okay with the switch also

## 2017-12-26 NOTE — Telephone Encounter (Signed)
lmtcb x1 for pt. 

## 2017-12-26 NOTE — Patient Instructions (Signed)
Will arrange for refitting of your CPAP mask and for overnight oxygen test with you using CPAP  Follow up in 8 weeks with Dr. Halford Chessman or Nurse Practitioner

## 2017-12-26 NOTE — Telephone Encounter (Signed)
Called and spoke to pt and relayed below message.  Apt for 01/31/17 with RB has been canceled.  Pt has pending OV with TP 02/06/17. Nothing further is needed.

## 2017-12-26 NOTE — Telephone Encounter (Signed)
I am okay with switch. 

## 2017-12-26 NOTE — Telephone Encounter (Signed)
Pt is returning call. Cb is [phone]

## 2017-12-27 DIAGNOSIS — G4733 Obstructive sleep apnea (adult) (pediatric): Secondary | ICD-10-CM | POA: Diagnosis not present

## 2017-12-30 ENCOUNTER — Other Ambulatory Visit: Payer: Self-pay | Admitting: Primary Care

## 2017-12-30 MED FILL — IPRAT-ALBUT 0.5-3(2.5) MG/3: 0.5-2.5 (3) | 15 days supply | Qty: 270 | Fill #0

## 2018-01-07 MED FILL — CARVEDILOL 12.5 MG TABLET: 12.5 | 30 days supply | Qty: 60 | Fill #0

## 2018-01-19 LAB — CUP PACEART REMOTE DEVICE CHECK
Battery Remaining Percentage: 65 %
Brady Statistic AP VP Percent: 15 %
Brady Statistic AP VS Percent: 1 %
Brady Statistic AS VS Percent: 1 %
Implantable Lead Implant Date: 19940705
Implantable Lead Implant Date: 19940705
Implantable Lead Location: 753858
Implantable Lead Location: 753860
Implantable Lead Model: 4024
Lead Channel Impedance Value: 430 Ohm
Lead Channel Impedance Value: 630 Ohm
Lead Channel Pacing Threshold Amplitude: 1.25 V
Lead Channel Pacing Threshold Amplitude: 1.5 V
Lead Channel Pacing Threshold Pulse Width: 0.4 ms
Lead Channel Pacing Threshold Pulse Width: 1 ms
Lead Channel Sensing Intrinsic Amplitude: 2 mV
Lead Channel Setting Pacing Amplitude: 2.25 V
Lead Channel Setting Pacing Amplitude: 2.5 V
Lead Channel Setting Pacing Pulse Width: 0.4 ms
Lead Channel Setting Sensing Sensitivity: 2 mV
MDC IDC LEAD IMPLANT DT: 20140501
MDC IDC LEAD LOCATION: 753859
MDC IDC MSMT BATTERY REMAINING LONGEVITY: 53 mo
MDC IDC MSMT BATTERY VOLTAGE: 2.9 V
MDC IDC MSMT LEADCHNL RA PACING THRESHOLD AMPLITUDE: 1 V
MDC IDC MSMT LEADCHNL RA PACING THRESHOLD PULSEWIDTH: 0.4 ms
MDC IDC MSMT LEADCHNL RV IMPEDANCE VALUE: 380 Ohm
MDC IDC MSMT LEADCHNL RV SENSING INTR AMPL: 4.5 mV
MDC IDC PG IMPLANT DT: 20140501
MDC IDC PG SERIAL: 2936617
MDC IDC SESS DTM: 20191111190018
MDC IDC SET LEADCHNL LV PACING PULSEWIDTH: 1 ms
MDC IDC SET LEADCHNL RA PACING AMPLITUDE: 2 V
MDC IDC STAT BRADY AS VP PERCENT: 84 %
MDC IDC STAT BRADY RA PERCENT PACED: 15 %
Pulse Gen Model: 3210

## 2018-01-23 ENCOUNTER — Other Ambulatory Visit: Payer: Self-pay | Admitting: *Deleted

## 2018-01-23 DIAGNOSIS — R918 Other nonspecific abnormal finding of lung field: Secondary | ICD-10-CM

## 2018-01-24 MED FILL — ALPRAZolam 0.25 MG TABS: 0.25 | 15 days supply | Qty: 30 | Fill #1

## 2018-01-24 MED FILL — FLUoxetine HCL 20 MG CAPS: 20 | 30 days supply | Qty: 30 | Fill #2

## 2018-01-31 ENCOUNTER — Ambulatory Visit: Payer: 59 | Admitting: Emergency Medicine

## 2018-02-06 ENCOUNTER — Ambulatory Visit: Payer: 59 | Admitting: Adult Health

## 2018-02-06 ENCOUNTER — Ambulatory Visit (HOSPITAL_BASED_OUTPATIENT_CLINIC_OR_DEPARTMENT_OTHER)
Admission: RE | Admit: 2018-02-06 | Discharge: 2018-02-06 | Disposition: A | Discharge status: Home / Self Care | Payer: 59 | Source: Ambulatory Visit | Attending: Cardiology | Admitting: Cardiology

## 2018-02-06 ENCOUNTER — Other Ambulatory Visit: Payer: 59

## 2018-02-06 DIAGNOSIS — R918 Other nonspecific abnormal finding of lung field: Secondary | ICD-10-CM | POA: Insufficient documentation

## 2018-02-06 MED FILL — TORSEMIDE 20 MG TABLET: 20 | 30 days supply | Qty: 60 | Fill #5

## 2018-02-07 ENCOUNTER — Telehealth: Payer: Self-pay

## 2018-02-07 NOTE — Telephone Encounter (Signed)
LMTCB

## 2018-02-07 NOTE — Telephone Encounter (Signed)
-----   Message from Will Meredith Leeds, MD sent at 02/07/2018  2:55 PM EST ----- Acute findings on CT scan.  Is remained stable from prior done for pre-ablation studies.  Plan per primary care as to addressing current findings.

## 2018-02-11 ENCOUNTER — Other Ambulatory Visit: Payer: Self-pay | Admitting: Family Medicine

## 2018-02-11 MED FILL — ALLOPURINOL 100 MG TABS: 100 | 30 days supply | Qty: 15 | Fill #1 | Status: TO

## 2018-02-13 ENCOUNTER — Ambulatory Visit (INDEPENDENT_AMBULATORY_CARE_PROVIDER_SITE_OTHER): Payer: 59 | Admitting: Family Medicine

## 2018-02-13 ENCOUNTER — Ambulatory Visit: Payer: 59 | Admitting: Adult Health

## 2018-02-13 ENCOUNTER — Encounter: Payer: Self-pay | Admitting: Family Medicine

## 2018-02-13 ENCOUNTER — Encounter: Payer: Self-pay | Admitting: Pulmonary Disease

## 2018-02-13 ENCOUNTER — Ambulatory Visit (INDEPENDENT_AMBULATORY_CARE_PROVIDER_SITE_OTHER): Payer: 59 | Admitting: Pulmonary Disease

## 2018-02-13 VITALS — BP 100/60 | HR 72 | Ht 62.0 in | Wt 115.0 lb

## 2018-02-13 VITALS — BP 92/78 | HR 73 | Ht 62.0 in | Wt 115.0 lb

## 2018-02-13 DIAGNOSIS — J432 Centrilobular emphysema: Secondary | ICD-10-CM

## 2018-02-13 DIAGNOSIS — M79671 Pain in right foot: Secondary | ICD-10-CM

## 2018-02-13 DIAGNOSIS — G4733 Obstructive sleep apnea (adult) (pediatric): Secondary | ICD-10-CM

## 2018-02-13 DIAGNOSIS — R918 Other nonspecific abnormal finding of lung field: Secondary | ICD-10-CM

## 2018-02-13 DIAGNOSIS — J449 Chronic obstructive pulmonary disease, unspecified: Secondary | ICD-10-CM

## 2018-02-13 MED ORDER — ALLOPURINOL 100 MG PO TABS: 50.0000 mg | ORAL_TABLET | Freq: Every day | ORAL | 2 refills | Status: DC

## 2018-02-13 MED ORDER — COLCHICINE 0.6 MG PO TABS: 0.6000 mg | ORAL_TABLET | Freq: Two times a day (BID) | ORAL | 1 refills | Status: DC | PRN

## 2018-02-13 MED FILL — COLCHICINE 0.6 MG TABS: 0.6 | 30 days supply | Qty: 60 | Fill #0

## 2018-02-13 NOTE — Progress Notes (Signed)
[facility]  Chief Complaint  Patient presents with  . Follow-up    Pt has increase cough in the morning with new cpap machine, and some dry mouth each morning.     Constitutional:  BP 100/60 (BP Location: Left Arm, Cuff Size: Normal)   Pulse 72   Ht 5\' 2"  (1.575 m)   Wt 115 lb (52.2 kg)   LMP  (LMP Unknown)   SpO2 99%   BMI 21.03 kg/m   Past Medical History:  PNA, Atrial fibrillation, Systolic CHF, Cluster headaches, Bradycardia s/p PM, Hypertrophic cardiomyopathy  Brief Summary:  Angela Shepherd is a 68 y.o. female former smoker with COPD/emphysema and obstructive sleep apnea.  She is followed in [facility] office.  She restarted CPAP several months ago.  She had cardiac CT in November 2019 (reviewed by me).  Showed Rt pleural thickening and calcification, emphysema, and pulmonary nodules.  She had repeat CT chest 02/07/18 (reviewed by me).  Showed chronic pleural thickening, micronodularity in apical regions.  She has noticed more productive cough since starting CPAP.  Her phlegm is clear to yellow.  Not having fever, chest pain, skin rash, joint swelling, or hemoptysis.  She is not aware of any recent exposures except restarting CPAP.  She has been using a vinegar solution to clean her CPAP.  She has not had any recent change in medications, travel history of sick exposures.  She quit smoking cigarettes years ago.  Physical Exam:   Appearance - well kempt   ENMT - no sinus tenderness, no nasal discharge, no oral exudate  Neck - no masses, trachea midline, no thyromegaly, no elevation in JVP  Respiratory - normal appearance of chest wall, normal respiratory effort w/o accessory muscle use, no dullness on percussion, no wheezing or rales  CV - s1s2 regular rate and rhythm, no murmurs, no peripheral edema, radial pulses symmetric  GI - soft, non tender  Lymph - no adenopathy noted in neck and axillary areas  MSK - normal  gait  Ext - no cyanosis, clubbing, or joint inflammation noted  Skin - no rashes, lesions, or ulcers  Neuro - normal strength, oriented x 3  Psych - normal mood and affect  Discussion:  She has persistent lung nodules associated with increased cough.  She is concerned that her symptoms have occurred around the time she resumed CPAP and that her CPAP cleaning solutions might be causing a reaction.  Her CT imaging appearance is suggestive of possible hypersensitivity pneumonitis.  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 - check ANA, ANCA, RF, HP panel - discussed alternative methods to cleaning her CPAP equipment - if symptoms persist, then might need bronchoscopy for BAL culture, cell count, and possible transbronchial biopsy  COPD with emphysema. - continue spiriva, symbicort, prn ventolin  Allergic rhinitis. - flonase, singulair  Obstructive sleep apnea. - she is compliant with CPAP - AHI still high on download; will change from CPAP 8 to 9 cm H2O - she will do overnight oximetry with CPAP later this month  Chronic systolic CHF, Atrial fibrillation. - followed by Cardiology   Patient Instructions  Will order following lab tests: CMET, CBC with differential, ANA, ANCA, Rheumatoid Factor, Hypersensitivity Pneumonitis panel  Will change CPAP to 9 cm H2O  Follow up in 2 weeks with Tammy Parrett in [place], [place] Pager: [phone] 02/13/2018, 12:09 PM  Flow  Sheet     Pulmonary tests:  PFT 07/29/17 >> FEV1 0.84 (41%), FEV1% 59, DLCO 41%  Chest imaging:  Cardiac CT 11/11/17 >> emphysema, Rt pleural thickening with calcification, new 4 mm nodule in RLL, new 3 mm nodule LUL. CT chest 02/07/18 >> pleural thickening, apical micronodularity  Sleep tests:  HST 08/16/17 >> AHI 16.9, SpO2 low 82%, Spent 308.3 min with SpO2 < 88% CPAP 09/24/17 >> CPAP 8 cm H2O,  centrals with higher pressures CPAP 11/15/17 to 02/12/18 >> used on 85 of 90 nights with average 5 hrs 30 min.  Average AHI 13.4 with CPAP 8 cm H2O.  Cardiac tests:  Echo 06/21/17 >> EF 35 to 50%, mild AR, mild/mod MR, PAS 41 mmHg  Medications:   Allergies as of 02/13/2018      Reactions   Prednisone Shortness Of Breath   CHF       Medication List       Accurate as of February 13, 2018 12:09 PM. Always use your most recent med list.        AEROCHAMBER MV inhaler Use as instructed with Symbicort   allopurinol 100 MG tablet Commonly known as:  ZYLOPRIM Take 0.5 tablets (50 mg total) by mouth daily.   ALPRAZolam 0.25 MG tablet Commonly known as:  XANAX TAKE 1 TABLET (0.25 MG TOTAL) BY MOUTH 2 (TWO) TIMES DAILY AS NEEDED FOR ANXIETY.   AMBULATORY NON FORMULARY MEDICATION Medication Name: 100% O2 15 leters a min for 15-20 mins  Via Nasal cannual   amitriptyline 10 MG tablet Commonly known as:  ELAVIL Take 1 tablet (10 mg total) by mouth at bedtime.   aspirin 81 MG chewable tablet Chew 81 mg by mouth daily.   budesonide-formoterol 160-4.5 MCG/ACT inhaler Commonly known as:  SYMBICORT INHALE 2 PUFFS BY MOUTH INTO THE LUNGS 2 TIMES A DAY   carvedilol 12.5 MG tablet Commonly known as:  COREG TAKE 1 TABLET BY MOUTH 2 TIMES DAILY WITH A MEAL.   colchicine 0.6 MG tablet Take 1 tablet (0.6 mg total) by mouth 2 (two) times daily as needed (for gout flare ups.).   cyclobenzaprine 5 MG tablet Commonly known as:  FLEXERIL TAKE 1 TABLET (5 MG TOTAL) BY MOUTH 3 (THREE) TIMES DAILY AS NEEDED FOR MUSCLE SPASMS.   diclofenac sodium 1 % Gel Commonly known as:  VOLTAREN Apply 2 g topically 4 (four) times daily.   ELIQUIS 5 MG Tabs tablet Generic drug:  apixaban TAKE 1 TABLET BY MOUTH TWICE DAILY   FLUoxetine 20 MG capsule Commonly known as:  PROZAC TAKE 1 CAPSULE (20 MG TOTAL) BY MOUTH DAILY.   fluticasone 50 MCG/ACT nasal spray Commonly known as:  FLONASE PLACE 2 SPRAYS  INTO BOTH NOSTRILS DAILY.   ipratropium-albuterol 0.5-2.5 (3) MG/3ML Soln Commonly known as:  DUONEB USE 1 VIAL (3ML) BY NEBULIZER EVERY 4 TO 6 HOURS AS NEEDED   losartan 25 MG tablet Commonly known as:  COZAAR Take 1 tablet (25 mg total) by mouth daily.   montelukast 10 MG tablet Commonly known as:  SINGULAIR Take 1 tablet (10 mg total) by mouth at bedtime.   nicotine 14 mg/24hr patch Commonly known as:  NICODERM CQ - dosed in mg/24 hours Place 1 patch (14 mg total) onto the skin daily.   potassium chloride SA 20 MEQ tablet Commonly known as:  K-DUR,KLOR-CON TAKE 1 TABLET (20 MEQ TOTAL) BY MOUTH DAILY.   SUMAtriptan 50 MG tablet Commonly known as:  IMITREX TAKE 1 TABLET AT  ONSET OF HEADACHE. MAY REPEAT IN 2 HOURS IF HEADACHE PERSISTS OR RECURS.   Tiotropium Bromide Monohydrate 2.5 MCG/ACT Aers Commonly known as:  SPIRIVA RESPIMAT Inhale 2 puffs into the lungs daily.   topiramate 50 MG tablet Commonly known as:  TOPAMAX Take 50 mg by mouth daily as needed (migraine headaches).   torsemide 20 MG tablet Commonly known as:  DEMADEX Take 1 tablet (20 mg total) by mouth 2 (two) times daily.   traMADol 50 MG tablet Commonly known as:  ULTRAM Take 0.5 tablets (25 mg total) by mouth every 12 (twelve) hours as needed.   VENTOLIN HFA 108 (90 Base) MCG/ACT inhaler Generic drug:  albuterol INHALE 1-2 PUFFS INTO THE LUNGS EVERY 6 (SIX) HOURS AS NEEDED FOR WHEEZING OR SHORTNESS OF BREATH.       Past Surgical History:  She  has a past surgical history that includes Tonsillectomy (~ 1959); Tubal ligation (1984?); Pacemaker insertion (1994; 2002; 05/08/2012); bi-ventricular pacemaker upgrade (N/A, 05/08/2012); Cardioversion (N/A, 09/19/2017); and ATRIAL FIBRILLATION ABLATION (N/A, 11/15/2017).  Family History:  Her family history includes Cancer (age of onset: 44) in her sister; Cardiomyopathy in her sister; Dementia in her mother; Diabetes in her brother and another family member;  Heart disease in her sister.  Social History:  She  reports that she quit smoking about 8 years ago. Her smoking use included cigarettes. She has a 15.00 pack-year smoking history. She has never used smokeless tobacco. She reports current alcohol use of about 2.0 standard drinks of alcohol per week. She reports that she does not use drugs.

## 2018-02-13 NOTE — Patient Instructions (Addendum)
Wait 2 weeks before starting the allopurinol. Get labs done after you've been on this for 4 weeks. Colchicine only if needed when you feel a flare. I'll contact you with the lab results and next steps. Follow up with me as needed otherwise.

## 2018-02-13 NOTE — Patient Instructions (Addendum)
Will order following lab tests: CMET, CBC with differential, ANA, ANCA, Rheumatoid Factor, Hypersensitivity Pneumonitis panel  Will change CPAP to 9 cm H2O  Follow up in 2 weeks with Tammy Parrett in [facility]

## 2018-02-14 ENCOUNTER — Encounter: Payer: Self-pay | Admitting: Family Medicine

## 2018-02-14 NOTE — Progress Notes (Signed)
PCP: Midge Minium, MD  Subjective:   11/04/17: HPI: Patient is a 68 y.o. female here for left foot pain.  Patient reports about 4 days of left foot pain consistent with her gout flares.  Pain is located over the medial left midfoot.  Currently at rest is 0/10 but is worse with palpation or weightbearing.  She denies any specific injury.  Typically she takes colchicine twice daily and notices improvement after about 5 days.  She is currently on day 3 of this treatment.  She has never been on chronic uric acid lowering treatment.  She denies any recent fevers or chills.  She reports swelling which has improved over the weekend.  She denies any erythema at this time but states that this was present when the pain started.  No numbness or tingling.  02/13/18: Patient reports a few days ago she started to get swelling into her right foot dorsally. This was associated with warmth and mild redness. She had some colchicine so she took 1 tablet twice a day for the past 3 days and this seems to have helped a lot. Her pain level is now down to 0 out of 10. No numbness.  Past Medical History:  Diagnosis Date  . Bradycardia    a. initial PPM [facility] at [facility] with gen change 2002. b. 1*AVB & intermittent 2nd degree AVB + CHF --> upgrade to [facility]. Jude BiV PPM 05/08/12.  . Chronic systolic CHF (congestive heart failure) ([facility])    a. EF 30-35% dx in 2011 -> most recent 40-45% by echo 04/2012. b. s/p upgrade to BiV-PPM 05/08/12. c. Med titration limited by hypotension.  . Cluster headache syndrome   . Hypertr obst cardiomyop   . Hypokalemia   . Hypotension   . Paroxysmal atrial fibrillation (H[facility])    a. identified on PPM interrogation b. longest episode 1 hour; if burden increases, will need [place]   . Pneumonia 2008  . Sleep apnea     Current Outpatient Medications on File Prior to Visit  Medication Sig Dispense Refill  . ALPRAZolam (XANAX) 0.25 MG tablet TAKE 1 TABLET (0.25 MG TOTAL) BY MOUTH 2 (TWO)  TIMES DAILY AS NEEDED FOR ANXIETY. 30 tablet 3  . AMBULATORY NON FORMULARY MEDICATION Medication Name: 100% O2 15 leters a min for 15-20 mins  Via Nasal cannual 1 Bottle 2  . amitriptyline (ELAVIL) 10 MG tablet Take 1 tablet (10 mg total) by mouth at bedtime. (Patient taking differently: Take 10 mg by mouth daily as needed (for cluster headaches.). ) 30 tablet 3  . aspirin 81 MG chewable tablet Chew 81 mg by mouth daily.    . budesonide-formoterol (SYMBICORT) 160-4.5 MCG/ACT inhaler INHALE 2 PUFFS BY MOUTH INTO THE LUNGS 2 TIMES A DAY (Patient taking differently: Inhale 2 puffs into the lungs 2 (two) times daily. INHALE 2 PUFFS BY MOUTH INTO THE LUNGS 2 TIMES A DAY) 3 Inhaler 1  . carvedilol (COREG) 12.5 MG tablet TAKE 1 TABLET BY MOUTH 2 TIMES DAILY WITH A MEAL. 60 tablet 3  . cyclobenzaprine (FLEXERIL) 5 MG tablet TAKE 1 TABLET (5 MG TOTAL) BY MOUTH 3 (THREE) TIMES DAILY AS NEEDED FOR MUSCLE SPASMS. (Patient taking differently: Take 5 mg by mouth 3 (three) times daily as needed for muscle spasms. ) 30 tablet 2  . diclofenac sodium (VOLTAREN) 1 % GEL Apply 2 g topically 4 (four) times daily. (Patient taking differently: Apply 2 g topically 4 (four) times daily as needed (pain.). ) 3  Tube 0  . ELIQUIS 5 MG TABS tablet TAKE 1 TABLET BY MOUTH TWICE DAILY (Patient taking differently: Take 5 mg by mouth 2 (two) times daily. ) 60 tablet 5  . FLUoxetine (PROZAC) 20 MG capsule TAKE 1 CAPSULE (20 MG TOTAL) BY MOUTH DAILY. 30 capsule 3  . fluticasone (FLONASE) 50 MCG/ACT nasal spray PLACE 2 SPRAYS INTO BOTH NOSTRILS DAILY. 16 g 5  . ipratropium-albuterol (DUONEB) 0.5-2.5 (3) MG/3ML SOLN USE 1 VIAL (3ML) BY NEBULIZER EVERY 4 TO 6 HOURS AS NEEDED 270 mL 1  . losartan (COZAAR) 25 MG tablet Take 1 tablet (25 mg total) by mouth daily. (Patient taking differently: Take 25 mg by mouth daily as needed (for systolic blood pressure greater than 100). ) 30 tablet 11  . montelukast (SINGULAIR) 10 MG tablet Take 1 tablet (10  mg total) by mouth at bedtime. 30 tablet 5  . nicotine (NICODERM CQ - DOSED IN MG/24 HOURS) 14 mg/24hr patch Place 1 patch (14 mg total) onto the skin daily. (Patient taking differently: Place 14 mg onto the skin daily as needed (for smoking urges/cravings.). ) 28 patch 0  . potassium chloride SA (K-DUR,KLOR-CON) 20 MEQ tablet TAKE 1 TABLET (20 MEQ TOTAL) BY MOUTH DAILY. (Patient taking differently: Take 20 mEq by mouth at bedtime. ) 90 tablet 1  . Spacer/Aero-Holding Chambers (AEROCHAMBER MV) inhaler Use as instructed with Symbicort 1 each 0  . SUMAtriptan (IMITREX) 50 MG tablet TAKE 1 TABLET AT ONSET OF HEADACHE. MAY REPEAT IN 2 HOURS IF HEADACHE PERSISTS OR RECURS. (Patient taking differently: Take 50 mg by mouth every 2 (two) hours as needed for migraine. Take 1 tablet at onset of headache. May repeat in 2 hours if headache persists or recurs.) 10 tablet 6  . Tiotropium Bromide Monohydrate (SPIRIVA RESPIMAT) 2.5 MCG/ACT AERS Inhale 2 puffs into the lungs daily. 3 Inhaler 1  . topiramate (TOPAMAX) 50 MG tablet Take 50 mg by mouth daily as needed (migraine headaches).     . torsemide (DEMADEX) 20 MG tablet Take 1 tablet (20 mg total) by mouth 2 (two) times daily. 60 tablet 5  . traMADol (ULTRAM) 50 MG tablet Take 0.5 tablets (25 mg total) by mouth every 12 (twelve) hours as needed. 12 tablet 0  . VENTOLIN HFA 108 (90 Base) MCG/ACT inhaler INHALE 1-2 PUFFS INTO THE LUNGS EVERY 6 (SIX) HOURS AS NEEDED FOR WHEEZING OR SHORTNESS OF BREATH. (Patient taking differently: Inhale 1-2 puffs into the lungs every 6 (six) hours as needed for wheezing or shortness of breath. ) 1 Inhaler 5   No current facility-administered medications on file prior to visit.     Past Surgical History:  Procedure Laterality Date  . ATRIAL FIBRILLATION ABLATION N/A 11/15/2017   Procedure: ATRIAL FIBRILLATION ABLATION;  Surgeon: Constance Haw, MD;  Location: [facility];  Service: Cardiovascular;  Laterality: N/A;  .  BI-VENTRICULAR PACEMAKER UPGRADE N/A 05/08/2012   upgrade to SJM Anthem (CRT-P) by Dr Rayann Heman  . CARDIOVERSION N/A 09/19/2017   Procedure: CARDIOVERSION;  Surgeon: Sanda Klein, MD;  Location: [facility];  Service: Cardiovascular;  Laterality: N/A;  . [place]; 2002; 05/08/2012  . TONSILLECTOMY  ~ 1959  . TUBAL LIGATION  1984?    Allergies  Allergen Reactions  . Prednisone Shortness Of Breath    CHF     Social History   Socioeconomic History  . Marital status: Widowed    Spouse name: Not on file  . Number of children: 2  .  Years of education: Not on file  . Highest education level: Not on file  Occupational History  . Occupation: [employer]    Employer: [employer]  . Financial resource strain: Not on file  . Food insecurity:    Worry: Not on file    Inability: Not on file  . Transportation needs:    Medical: Not on file    Non-medical: Not on file  Tobacco Use  . Smoking status: Former Smoker    Packs/day: 0.50    Years: 30.00    Pack years: 15.00    Types: Cigarettes    Last attempt to quit: 01/08/2010    Years since quitting: 8.1  . Smokeless tobacco: Never Used  Substance and Sexual Activity  . Alcohol use: Yes    Alcohol/week: 2.0 standard drinks    Types: 2 Glasses of wine per week  . Drug use: No  . Sexual activity: Never  Lifestyle  . Physical activity:    Days per week: Not on file    Minutes per session: Not on file  . Stress: Not on file  Relationships  . Social connections:    Talks on phone: Not on file    Gets together: Not on file    Attends religious service: Not on file    Active member of club or organization: Not on file    Attends meetings of clubs or organizations: Not on file    Relationship status: Not on file  . Intimate partner violence:    Fear of current or ex partner: Not on file    Emotionally abused: Not on file    Physically abused: Not on file    Forced sexual activity: Not on  file  Other Topics Concern  . Not on file  Social History Narrative   ** Merged History Encounter **        Family History  Problem Relation Age of Onset  . Heart disease Sister   . Dementia Mother   . Cancer Sister 52       uterine  . Diabetes Other   . Cardiomyopathy Sister   . Diabetes Brother   . Colon cancer Neg Hx   . Esophageal cancer Neg Hx   . Stomach cancer Neg Hx   . Rectal cancer Neg Hx     BP 92/78   Pulse 73   Ht 5\' 2"  (1.575 m)   Wt 115 lb (52.2 kg)   LMP  (LMP Unknown)   BMI 21.03 kg/m   Review of Systems: See HPI above.     Objective:  Physical Exam:  Gen: NAD, comfortable in exam room  Right foot/ankle: No gross deformity, swelling, ecchymoses, warmth or redness FROM with 5/5 strength all directions TTP mildly dorsal foot. Negative ant drawer and talar tilt.   Negative syndesmotic compression. Negative metatarsal squeeze. Thompsons test negative. NV intact distally.   Assessment & Plan:  1.  Right foot pain: Secondary to mild gout flare.  We will refill her colchicine which she will take as needed if she feels a flare coming on.  She will wait 2 weeks before starting her allopurinol.  After about 4 weeks of the allopurinol she was advised to return for uric acid and basic metabolic panel.  She will follow-up with me as needed otherwise.  We will contact her with lab results and next steps.

## 2018-02-17 ENCOUNTER — Encounter: Payer: 59 | Admitting: Cardiology

## 2018-02-17 ENCOUNTER — Encounter: Payer: Self-pay | Admitting: Pulmonary Disease

## 2018-02-17 LAB — CBC WITH DIFFERENTIAL/PLATELET
BASOS ABS: 0.1 10*3/uL (ref 0.0–0.2)
Basos: 1 %
EOS (ABSOLUTE): 0.1 10*3/uL (ref 0.0–0.4)
EOS: 3 %
HEMATOCRIT: 40 % (ref 34.0–46.6)
HEMOGLOBIN: 13.6 g/dL (ref 11.1–15.9)
Immature Grans (Abs): 0 10*3/uL (ref 0.0–0.1)
Immature Granulocytes: 0 %
LYMPHS ABS: 1.3 10*3/uL (ref 0.7–3.1)
Lymphs: 29 %
MCH: 32.8 pg (ref 26.6–33.0)
MCHC: 34 g/dL (ref 31.5–35.7)
MCV: 96 fL (ref 79–97)
Monocytes Absolute: 0.4 10*3/uL (ref 0.1–0.9)
Monocytes: 9 %
Neutrophils Absolute: 2.7 10*3/uL (ref 1.4–7.0)
Neutrophils: 58 %
Platelets: 238 10*3/uL (ref 150–450)
RBC: 4.15 x10E6/uL (ref 3.77–5.28)
RDW: 13.2 % (ref 11.7–15.4)
WBC: 4.7 10*3/uL (ref 3.4–10.8)

## 2018-02-17 LAB — COMPREHENSIVE METABOLIC PANEL
ALK PHOS: 90 IU/L (ref 39–117)
ALT: 13 IU/L (ref 0–32)
AST: 19 IU/L (ref 0–40)
Albumin/Globulin Ratio: 1.6 (ref 1.2–2.2)
Albumin: 4.2 g/dL (ref 3.8–4.8)
BUN/Creatinine Ratio: 23 (ref 12–28)
BUN: 27 mg/dL (ref 8–27)
Bilirubin Total: 0.3 mg/dL (ref 0.0–1.2)
CALCIUM: 9.3 mg/dL (ref 8.7–10.3)
CO2: 24 mmol/L (ref 20–29)
CREATININE: 1.19 mg/dL — AB (ref 0.57–1.00)
Chloride: 101 mmol/L (ref 96–106)
GFR, EST AFRICAN AMERICAN: 55 mL/min/{1.73_m2} — AB (ref >59)
GFR, EST NON AFRICAN AMERICAN: 48 mL/min/{1.73_m2} — AB (ref >59)
Globulin, Total: 2.7 g/dL (ref 1.5–4.5)
Glucose: 82 mg/dL (ref 65–99)
Potassium: 3.6 mmol/L (ref 3.5–5.2)
SODIUM: 141 mmol/L (ref 134–144)
Total Protein: 6.9 g/dL (ref 6.0–8.5)

## 2018-02-17 LAB — HYPERSENSITIVITY PNEUMONITIS
A. FUMIGATUS #1 ABS: NEGATIVE
A. Pullulans Abs: NEGATIVE
MICROPOLYSPORA FAENI IGG: NEGATIVE
PIGEON SERUM ABS: NEGATIVE
THERMOACT. SACCHARII: NEGATIVE
Thermoactinomyces vulgaris, IgG: NEGATIVE

## 2018-02-17 LAB — MPO/PR-3 (ANCA) ANTIBODIES: Myeloperoxidase Ab: <9 U/mL (ref 0.0–9.0)

## 2018-02-17 LAB — RHEUMATOID FACTOR: Rhuematoid fact SerPl-aCnc: 57.1 IU/mL — ABNORMAL HIGH (ref 0.0–13.9)

## 2018-02-17 LAB — ANA: ANA: NEGATIVE

## 2018-02-19 ENCOUNTER — Encounter: Payer: Self-pay | Admitting: Pulmonary Disease

## 2018-02-19 ENCOUNTER — Ambulatory Visit: Payer: 59 | Admitting: Adult Health

## 2018-02-19 DIAGNOSIS — G4733 Obstructive sleep apnea (adult) (pediatric): Secondary | ICD-10-CM | POA: Diagnosis not present

## 2018-02-20 MED FILL — ALPRAZolam 0.25 MG TABS: 0.25 | 15 days supply | Qty: 30 | Fill #2

## 2018-02-21 ENCOUNTER — Telehealth: Payer: Self-pay | Admitting: Cardiology

## 2018-02-21 ENCOUNTER — Ambulatory Visit (INDEPENDENT_AMBULATORY_CARE_PROVIDER_SITE_OTHER): Payer: 59

## 2018-02-21 DIAGNOSIS — I428 Other cardiomyopathies: Secondary | ICD-10-CM

## 2018-02-21 DIAGNOSIS — I5022 Chronic systolic (congestive) heart failure: Secondary | ICD-10-CM | POA: Diagnosis not present

## 2018-02-21 NOTE — Telephone Encounter (Signed)
Pt wanted to make sure that her Home pacemaker transmission went through, because she got a MyChart message saying she missed her appt

## 2018-02-21 NOTE — Telephone Encounter (Signed)
I talked with the pt and let her know that the last transmission received was on 11/18/2017. I told her to try a to send the transmission again and if I do not receive it by 2 pm today I will give her a call back. Pt agreed with understanding.

## 2018-02-21 NOTE — Telephone Encounter (Signed)
I called the patient back to let her know we still did not receive that transmission. Pt states she will try again and I also gave her the number to [facility] support for additional help if needed.

## 2018-02-24 LAB — CUP PACEART REMOTE DEVICE CHECK
Battery Voltage: 2.89 V
Brady Statistic AP VS Percent: 1 %
Brady Statistic AS VP Percent: 74 %
Brady Statistic RA Percent Paced: 23 %
Date Time Interrogation Session: 20200214190539
Implantable Lead Implant Date: 19940705
Implantable Lead Implant Date: 19940705
Implantable Lead Location: 753859
Implantable Lead Model: 4524
Implantable Pulse Generator Implant Date: 20140501
Lead Channel Impedance Value: 350 Ohm
Lead Channel Impedance Value: 410 Ohm
Lead Channel Pacing Threshold Amplitude: 1 V
Lead Channel Pacing Threshold Pulse Width: 0.4 ms
Lead Channel Pacing Threshold Pulse Width: 0.4 ms
Lead Channel Pacing Threshold Pulse Width: 1 ms
Lead Channel Sensing Intrinsic Amplitude: 4.7 mV
Lead Channel Setting Pacing Amplitude: 2 V
Lead Channel Setting Pacing Amplitude: 2 V
Lead Channel Setting Pacing Pulse Width: 0.4 ms
Lead Channel Setting Pacing Pulse Width: 1 ms
MDC IDC LEAD IMPLANT DT: 20140501
MDC IDC LEAD LOCATION: 753858
MDC IDC LEAD LOCATION: 753860
MDC IDC MSMT BATTERY REMAINING LONGEVITY: 45 mo
MDC IDC MSMT BATTERY REMAINING PERCENTAGE: 57 %
MDC IDC MSMT LEADCHNL LV IMPEDANCE VALUE: 530 Ohm
MDC IDC MSMT LEADCHNL LV PACING THRESHOLD AMPLITUDE: 1.5 V
MDC IDC MSMT LEADCHNL RA SENSING INTR AMPL: 2.3 mV
MDC IDC MSMT LEADCHNL RV PACING THRESHOLD AMPLITUDE: 1 V
MDC IDC PG SERIAL: 2936617
MDC IDC SET LEADCHNL LV PACING AMPLITUDE: 2.5 V
MDC IDC SET LEADCHNL RV SENSING SENSITIVITY: 2 mV
MDC IDC STAT BRADY AP VP PERCENT: 25 %
MDC IDC STAT BRADY AS VS PERCENT: 1 %

## 2018-02-27 ENCOUNTER — Encounter: Payer: Self-pay | Admitting: Adult Health

## 2018-02-27 ENCOUNTER — Other Ambulatory Visit: Payer: Self-pay | Admitting: Primary Care

## 2018-02-27 ENCOUNTER — Ambulatory Visit (INDEPENDENT_AMBULATORY_CARE_PROVIDER_SITE_OTHER): Payer: 59 | Admitting: Adult Health

## 2018-02-27 VITALS — BP 112/62 | HR 72 | Ht 62.0 in | Wt 120.0 lb

## 2018-02-27 DIAGNOSIS — I4819 Other persistent atrial fibrillation: Secondary | ICD-10-CM | POA: Diagnosis not present

## 2018-02-27 DIAGNOSIS — J449 Chronic obstructive pulmonary disease, unspecified: Secondary | ICD-10-CM | POA: Diagnosis not present

## 2018-02-27 DIAGNOSIS — G4733 Obstructive sleep apnea (adult) (pediatric): Secondary | ICD-10-CM

## 2018-02-27 DIAGNOSIS — R9389 Abnormal findings on diagnostic imaging of other specified body structures: Secondary | ICD-10-CM

## 2018-02-27 DIAGNOSIS — R768 Other specified abnormal immunological findings in serum: Secondary | ICD-10-CM | POA: Diagnosis not present

## 2018-02-27 DIAGNOSIS — R21 Rash and other nonspecific skin eruption: Secondary | ICD-10-CM | POA: Insufficient documentation

## 2018-02-27 DIAGNOSIS — R7689 Other specified abnormal immunological findings in serum: Secondary | ICD-10-CM

## 2018-02-27 DIAGNOSIS — L989 Disorder of the skin and subcutaneous tissue, unspecified: Secondary | ICD-10-CM | POA: Diagnosis not present

## 2018-02-27 DIAGNOSIS — I5032 Chronic diastolic (congestive) heart failure: Secondary | ICD-10-CM | POA: Diagnosis not present

## 2018-02-27 DIAGNOSIS — Z720 Tobacco use: Secondary | ICD-10-CM

## 2018-02-27 DIAGNOSIS — G4734 Idiopathic sleep related nonobstructive alveolar hypoventilation: Secondary | ICD-10-CM

## 2018-02-27 MED FILL — FLUoxetine HCL 20 MG CAPS: 20 | 30 days supply | Qty: 30 | Fill #3 | Status: TO

## 2018-02-27 MED FILL — VENTOLIN HFA 90 MCG INHALER: 108 (90 BAS | 25 days supply | Qty: 18 | Fill #1

## 2018-02-27 NOTE — Assessment & Plan Note (Signed)
Moderate sleep apnea with significant cardiovascular risk factors.  Despite CPAP adjustments patient continues to high AHI.  Suspect mask is contributing some.  Order new mask.  However suspect patient needs a CPAP titration study and might need a BiPAP device to decrease number of internal events. Overnight oximetry on CPAP has been completed.  Results are pending and have been requested Patient is to be set up for a CPAP titration study.

## 2018-02-27 NOTE — Assessment & Plan Note (Signed)
Abnormal CT chest with biapical micro-nodularity questionable etiology.  This could be secondary to bronchiolitis with ongoing smoking exposure. However autoimmune work-up showed a positive rheumatoid factor.  Patient will be referred to rheumatology for further evaluation

## 2018-02-27 NOTE — Patient Instructions (Signed)
Refer to Rheumatology .  Set up for CPAP titration study .  Continue on Symbicort and Spiriva , after use.  Very important to stop smoking .  Continue on CPAP At bedtime   Order for new mask size.  Refer to Dermatology .  Contact Cardiology regarding Eliquis  Follow up with Dr. Halford Chessman in 6-8 weeks at [facility] office.  Please contact office for sooner follow up if symptoms do not improve or worsen or seek emergency care

## 2018-02-27 NOTE — Assessment & Plan Note (Signed)
Hypopigmented rash along dorsal aspect of both feet questionable etiology as this may be contact dermatitis versus severe eczema also concern of possible psoriasis.  Will refer to dermatology for further evaluation

## 2018-02-27 NOTE — Assessment & Plan Note (Signed)
Severe COPD w/ ongoing smoking - Appears compensated without flare . No active symptoms .  encouarged on smoking cessation .  Cont on Symbicort and Spiriva

## 2018-02-27 NOTE — Assessment & Plan Note (Signed)
Smoking cessation  

## 2018-02-27 NOTE — Assessment & Plan Note (Signed)
Recent ablation.  Patient is supposed to be on Eliquis.  She is stopped this on her own.  Of advised her to contact cardiology for further instructions and encouraged on medication compliance.

## 2018-02-27 NOTE — Assessment & Plan Note (Signed)
Appears compensated without evidence of volume overload on exam.  Continue current regimen follow-up with cardiology as planned

## 2018-02-27 NOTE — Progress Notes (Signed)
@Patient  ID: Angela Shepherd, female    DOB: 30-Apr-1950, 69 y.o.   MRN: [id number]  Chief Complaint  Patient presents with  . Follow-up    COPD     Referring provider: Midge Minium, MD  HPI: 68 year old female active smoker followed for COPD and obstructive sleep apnea, abnormal CT chest with micro-nodularity noted Medical history significant for congestive heart failure, A. fib on Eliquis Works for [employer] in [employer]   TEST/EVENTS :  PFT 07/29/17 >> FEV1 0.84 (41%), FEV1% 59, DLCO 41%  Chest imaging:  Cardiac CT 11/11/17 >> emphysema, Rt pleural thickening with calcification, new 4 mm nodule in RLL, new 3 mm nodule LUL. CT chest 02/07/18 >> pleural thickening, apical micronodularity  Sleep tests:  HST 08/16/17 >> AHI 16.9, SpO2 low 82%, Spent 308.3 min with SpO2 < 88% CPAP 09/24/17 >> CPAP 8 cm H2O, centrals with higher pressures CPAP 11/15/17 to 02/12/18 >> used on 85 of 90 nights with average 5 hrs 30 min.  Average AHI 13.4 with CPAP 8 cm H2O.  Cardiac tests:  Echo 06/21/17 >> EF 35 to 40%, mild AR, mild/mod MR, PAS 41 mmHg  02/27/2018 Follow up ; COPD , OSA , Abnormal CT chest  Patient presents for a 2-week follow-up.  Patient was seen last visit for abnormal CT chest.   CT chest shows bi-apical micro-nodularity.  Autoimmune/connective tissue panel showed a positive rheumatoid factor.  Rest of panel was negative  (HSP panel,  ANCA, ANA) No joint swelling or stiffness, has chronic right hip pain .  No cough. No increased dyspnea. Has mild dyspnea with heavy activity . No increased leg swelling .   Patient has severe COPD with emphysema.  She remains on Spiriva and Symbicort. Patient has restarted smoking. We discussed smoking cessation  Denies cough or wheezing .   Patient has moderate sleep apnea.  She is on CPAP.  Patient says she is doing okay on CPAP.  Is been having a few mask is used.  We have contacted her DME for supplies.  Patient's download shows compliance  with average usage at 5.5 hours.  Patient is on CPAP 9 cm H2O.  AHI 14. Last visit CPAP pressure was adjusted however her AHI has remained high.  She was set up for an overnight oximetry on CPAP.  She has CHF and A Fib , recent ablation . Says she bruises easily . She stopped her Eliquis but has not notified Cardiology . We discussed the importance of med compliance .    Allergies  Allergen Reactions  . Prednisone Shortness Of Breath    CHF     Immunization History  Administered Date(s) Administered  . Hepatitis B, adult 06/14/2015, 07/15/2015  . Influenza Split 10/21/2017  . Influenza,inj,Quad PF,6+ Mos 10/22/2012, 10/15/2013, 10/18/2014, 10/18/2015, 09/07/2016  . Pneumococcal Conjugate-13 02/08/2006  . Pneumococcal Polysaccharide-23 10/22/2012, 01/18/2017  . Tdap 05/04/2011  . Zoster 10/22/2012    Past Medical History:  Diagnosis Date  . Bradycardia    a. initial PPM placed 1994 at [facility] with gen change 2002. b. 1*AVB & intermittent 2nd degree AVB + CHF --> upgrade to [facility] Jude BiV PPM 05/08/12.  . Chronic systolic CHF (congestive heart failure) ([place])    a. EF 30-35% dx in 2011 -> most recent 40-45% by echo 04/2012. b. s/p upgrade to BiV-PPM 05/08/12. c. Med titration limited by hypotension.  . Cluster headache syndrome   . Hypertr obst cardiomyop   . Hypokalemia   . Hypotension   .  Paroxysmal atrial fibrillation (HCC)    a. identified on PPM interrogation b. longest episode 1 hour; if burden increases, will need [place]   . Pneumonia 2008  . Sleep apnea     Tobacco History: Social History   Tobacco Use  Smoking Status Current Every Day Smoker  . Packs/day: 0.50  . Years: 30.00  . Pack years: 15.00  . Types: Cigarettes  . Last attempt to quit: 01/08/2010  . Years since quitting: 8.1  Smokeless Tobacco Never Used   Ready to quit: No Counseling given: Yes   Outpatient Medications Prior to Visit  Medication Sig Dispense Refill  . allopurinol (ZYLOPRIM) 100 MG tablet  Take 0.5 tablets (50 mg total) by mouth daily. 15 tablet 2  . ALPRAZolam (XANAX) 0.25 MG tablet TAKE 1 TABLET (0.25 MG TOTAL) BY MOUTH 2 (TWO) TIMES DAILY AS NEEDED FOR ANXIETY. 30 tablet 3  . AMBULATORY NON FORMULARY MEDICATION Medication Name: 100% O2 15 leters a min for 15-20 mins  Via Nasal cannual 1 Bottle 2  . amitriptyline (ELAVIL) 10 MG tablet Take 1 tablet (10 mg total) by mouth at bedtime. (Patient taking differently: Take 10 mg by mouth daily as needed (for cluster headaches.). ) 30 tablet 3  . aspirin 81 MG chewable tablet Chew 81 mg by mouth daily.    . budesonide-formoterol (SYMBICORT) 160-4.5 MCG/ACT inhaler INHALE 2 PUFFS BY MOUTH INTO THE LUNGS 2 TIMES A DAY (Patient taking differently: Inhale 2 puffs into the lungs 2 (two) times daily. INHALE 2 PUFFS BY MOUTH INTO THE LUNGS 2 TIMES A DAY) 3 Inhaler 1  . carvedilol (COREG) 12.5 MG tablet TAKE 1 TABLET BY MOUTH 2 TIMES DAILY WITH A MEAL. 60 tablet 3  . colchicine 0.6 MG tablet Take 1 tablet (0.6 mg total) by mouth 2 (two) times daily as needed (for gout flare ups.). 60 tablet 1  . cyclobenzaprine (FLEXERIL) 5 MG tablet TAKE 1 TABLET (5 MG TOTAL) BY MOUTH 3 (THREE) TIMES DAILY AS NEEDED FOR MUSCLE SPASMS. (Patient taking differently: Take 5 mg by mouth 3 (three) times daily as needed for muscle spasms. ) 30 tablet 2  . diclofenac sodium (VOLTAREN) 1 % GEL Apply 2 g topically 4 (four) times daily. (Patient taking differently: Apply 2 g topically 4 (four) times daily as needed (pain.). ) 3 Tube 0  . ELIQUIS 5 MG TABS tablet TAKE 1 TABLET BY MOUTH TWICE DAILY (Patient taking differently: Take 5 mg by mouth 2 (two) times daily. ) 60 tablet 5  . FLUoxetine (PROZAC) 20 MG capsule TAKE 1 CAPSULE (20 MG TOTAL) BY MOUTH DAILY. 30 capsule 3  . fluticasone (FLONASE) 50 MCG/ACT nasal spray PLACE 2 SPRAYS INTO BOTH NOSTRILS DAILY. 16 g 5  . ipratropium-albuterol (DUONEB) 0.5-2.5 (3) MG/3ML SOLN USE 1 VIAL (3ML) BY NEBULIZER EVERY 4 TO 6 HOURS AS  NEEDED 270 mL 1  . losartan (COZAAR) 25 MG tablet Take 1 tablet (25 mg total) by mouth daily. (Patient taking differently: Take 25 mg by mouth daily as needed (for systolic blood pressure greater than 100). ) 30 tablet 11  . montelukast (SINGULAIR) 10 MG tablet Take 1 tablet (10 mg total) by mouth at bedtime. 30 tablet 5  . nicotine (NICODERM CQ - DOSED IN MG/24 HOURS) 14 mg/24hr patch Place 1 patch (14 mg total) onto the skin daily. (Patient taking differently: Place 14 mg onto the skin daily as needed (for smoking urges/cravings.). ) 28 patch 0  . potassium chloride SA (K-DUR,KLOR-CON) 20  MEQ tablet TAKE 1 TABLET (20 MEQ TOTAL) BY MOUTH DAILY. (Patient taking differently: Take 20 mEq by mouth at bedtime. ) 90 tablet 1  . Spacer/Aero-Holding Chambers (AEROCHAMBER MV) inhaler Use as instructed with Symbicort 1 each 0  . SUMAtriptan (IMITREX) 50 MG tablet TAKE 1 TABLET AT ONSET OF HEADACHE. MAY REPEAT IN 2 HOURS IF HEADACHE PERSISTS OR RECURS. (Patient taking differently: Take 50 mg by mouth every 2 (two) hours as needed for migraine. Take 1 tablet at onset of headache. May repeat in 2 hours if headache persists or recurs.) 10 tablet 6  . Tiotropium Bromide Monohydrate (SPIRIVA RESPIMAT) 2.5 MCG/ACT AERS Inhale 2 puffs into the lungs daily. 3 Inhaler 1  . topiramate (TOPAMAX) 50 MG tablet Take 50 mg by mouth daily as needed (migraine headaches).     . traMADol (ULTRAM) 50 MG tablet Take 0.5 tablets (25 mg total) by mouth every 12 (twelve) hours as needed. 12 tablet 0  . VENTOLIN HFA 108 (90 Base) MCG/ACT inhaler INHALE 1-2 PUFFS INTO THE LUNGS EVERY 6 (SIX) HOURS AS NEEDED FOR WHEEZING OR SHORTNESS OF BREATH. (Patient taking differently: Inhale 1-2 puffs into the lungs every 6 (six) hours as needed for wheezing or shortness of breath. ) 1 Inhaler 5  . torsemide (DEMADEX) 20 MG tablet Take 1 tablet (20 mg total) by mouth 2 (two) times daily. 60 tablet 5   No facility-administered medications prior to  visit.      Review of Systems:   Constitutional:   No  weight loss, night sweats,  Fevers, chills,  +fatigue, or  lassitude.  HEENT:   No headaches,  Difficulty swallowing,  Tooth/dental problems, or  Sore throat,                No sneezing, itching, ear ache, nasal congestion, post nasal drip,   CV:  No chest pain,  Orthopnea, PND, swelling in lower extremities, anasarca, dizziness, palpitations, syncope.   GI  No heartburn, indigestion, abdominal pain, nausea, vomiting, diarrhea, change in bowel habits, loss of appetite, bloody stools.   Resp:    No chest wall deformity  Skin: no rash or lesions.  GU: no dysuria, change in color of urine, no urgency or frequency.  No flank pain, no hematuria   MS:  No joint pain or swelling.  No decreased range of motion.  No back pain.    Physical Exam  BP 112/62 (BP Location: Left Arm, Cuff Size: Normal)   Pulse 72   Ht 5\' 2"  (1.575 m)   Wt 120 lb (54.4 kg)   LMP  (LMP Unknown)   SpO2 96%   BMI 21.95 kg/m   GEN: A/Ox3; pleasant , NAD, well nourished    HEENT:  [place]/AT,  EACs-clear, TMs-wnl, NOSE-clear, THROAT-clear, no lesions, no postnasal drip or exudate noted.   NECK:  Supple w/ fair ROM; no JVD; normal carotid impulses w/o bruits; no thyromegaly or nodules palpated; no lymphadenopathy.    RESP  Clear  P & A; w/o, wheezes/ rales/ or rhonchi. no accessory muscle use, no dullness to percussion  CARD:  RRR, no m/r/g, tr  peripheral edema, pulses intact, no cyanosis or clubbing.  GI:   Soft & nt; nml bowel sounds; no organomegaly or masses detected.   Musco: Warm bil, no deformities or joint swelling noted.   Neuro: alert, no focal deficits noted.    Skin: Warm, along bilateral feet -dorsal aspect scattered hypopigmented areas/lesions     Lab Results:  CBC  Component Value Date/Time   WBC 4.7 02/13/2018 1243   WBC 5.8 09/18/2017 1614   RBC 4.15 02/13/2018 1243   RBC 4.37 09/18/2017 1614   HGB 13.6 02/13/2018 1243     HCT 40.0 02/13/2018 1243   PLT 238 02/13/2018 1243   MCV 96 02/13/2018 1243   MCH 32.8 02/13/2018 1243   MCH 31.8 09/18/2017 1614   MCHC 34.0 02/13/2018 1243   MCHC 31.2 09/18/2017 1614   RDW 13.2 02/13/2018 1243   LYMPHSABS 1.3 02/13/2018 1243   MONOABS 0.5 07/04/2017 1008   EOSABS 0.1 02/13/2018 1243   BASOSABS 0.1 02/13/2018 1243    BMET    Component Value Date/Time   NA 141 02/13/2018 1243   K 3.6 02/13/2018 1243   CL 101 02/13/2018 1243   CO2 24 02/13/2018 1243   GLUCOSE 82 02/13/2018 1243   GLUCOSE 88 09/18/2017 1614   BUN 27 02/13/2018 1243   CREATININE 1.19 (H) 02/13/2018 1243   CREATININE 1.30 (H) 03/15/2015 0955   CALCIUM 9.3 02/13/2018 1243   GFRNONAA 48 (L) 02/13/2018 1243   GFRAA 55 (L) 02/13/2018 1243    BNP    Component Value Date/Time   BNP 781.6 (H) 06/20/2017 1644   BNP 161.0 (H) 01/20/2015 1647    ProBNP    Component Value Date/Time   PROBNP 1,698.0 (H) 08/05/2017 1307    Imaging: Ct Chest Wo Contrast  Result Date: 02/07/2018 CLINICAL DATA:  Follow-up pulmonary nodules EXAM: CT CHEST WITHOUT CONTRAST TECHNIQUE: Multidetector CT imaging of the chest was performed following the standard protocol without IV contrast. COMPARISON:  11/11/2017 and earlier FINDINGS: Cardiovascular: Cardiomegaly, most notably left atrial enlargement. Biventricular pacer from the left. No pericardial effusion. There is atherosclerosis. No acute vascular finding by noncontrast CT. Mediastinum/Nodes: Negative for adenopathy. 14 mm right thyroid nodule, not significantly changed since 2016. Lungs/Pleura: Chronic pleural thickening with curvilinear subpleural opacities in the right lung. There is micro nodularity in the apical lungs not seen February 2018 and minimally covered on gated study 11/11/2017-with no evident progression. No discrete masslike finding or progression of nodularity where covered on prior. Upper Abdomen: Left renal cystic density. Musculoskeletal: No acute  or aggressive finding. IMPRESSION: 1. Continued biapical micro nodularity known from 11/11/2017 cardiac CT and new from February 2019. If an active smoker respiratory bronchiolitis would be favored. If there is infectious symptoms an indolent infectious bronchiolitis would be favored. Otherwise, would consider hypersensitivity pneumonitis. 2. Pleural scarring and round atelectasis on the right. Electronically Signed   By: Monte Fantasia M.D.   On: 02/07/2018 08:19      PFT Results Latest Ref Rng & Units 07/29/2017 07/04/2016  FVC-Pre L 1.30 2.04  FVC-Predicted Pre % 48 75  FVC-Post L 1.44 2.00  FVC-Predicted Post % 54 74  Pre FEV1/FVC % % 59 59  Post FEV1/FCV % % 59 64  FEV1-Pre L 0.77 1.19  FEV1-Predicted Pre % 37 57  FEV1-Post L 0.84 1.28  DLCO UNC% % 41 68  DLCO COR %Predicted % 65 90  TLC L - 4.22  TLC % Predicted % - 94  RV % Predicted % - 116    No results found for: NITRICOXIDE      Assessment & Plan:   COPD (chronic obstructive pulmonary disease) (HCC) Severe COPD w/ ongoing smoking - Appears compensated without flare . No active symptoms .  encouarged on smoking cessation .  Cont on Symbicort and Spiriva    Tobacco use Smoking cessation  Chronic  diastolic heart failure Appears compensated without evidence of volume overload on exam.  Continue current regimen follow-up with cardiology as planned  OSA (obstructive sleep apnea) Moderate sleep apnea with significant cardiovascular risk factors.  Despite CPAP adjustments patient continues to high AHI.  Suspect mask is contributing some.  Order new mask.  However suspect patient needs a CPAP titration study and might need a BiPAP device to decrease number of internal events. Overnight oximetry on CPAP has been completed.  Results are pending and have been requested Patient is to be set up for a CPAP titration study.   Persistent atrial fibrillation ([place]) Recent ablation.  Patient is supposed to be on Eliquis.  She  is stopped this on her own.  Of advised her to contact cardiology for further instructions and encouraged on medication compliance.  Abnormal CT of the chest Abnormal CT chest with biapical micro-nodularity questionable etiology.  This could be secondary to bronchiolitis with ongoing smoking exposure. However autoimmune work-up showed a positive rheumatoid factor.  Patient will be referred to rheumatology for further evaluation   Rash Hypopigmented rash along dorsal aspect of both feet questionable etiology as this may be contact dermatitis versus severe eczema also concern of possible psoriasis.  Will refer to dermatology for further evaluation     Rexene Edison, NP 02/27/2018

## 2018-02-28 MED FILL — SYMBICORT 160-4.5 MCG INH: 160-4.5 | 30 days supply | Qty: 10 | Fill #0

## 2018-02-28 MED FILL — SPIRIVA RESPIMAT 2.5 MCG IN: 2.5 | 30 days supply | Qty: 4 | Fill #0 | Status: TO

## 2018-03-03 ENCOUNTER — Encounter: Payer: 59 | Admitting: Cardiology

## 2018-03-03 MED FILL — MONTELUKAST SOD 10 MG TAB: 10 | 30 days supply | Qty: 30 | Fill #4 | Status: TO

## 2018-03-03 MED FILL — FLUTICASONE PROP 50 MCG SPR: 50 | 30 days supply | Qty: 16 | Fill #1

## 2018-03-03 NOTE — Progress Notes (Signed)
Reviewed and agree with assessment/plan.   Ima Hafner, MD [facility] 01/04/2016, 12:24 PM Pager:  [phone]  

## 2018-03-04 NOTE — Progress Notes (Signed)
Remote pacemaker transmission.   

## 2018-03-05 ENCOUNTER — Telehealth: Payer: Self-pay | Admitting: Pulmonary Disease

## 2018-03-05 NOTE — Telephone Encounter (Signed)
ONO with CPAP and RA 02/19/18 >> test time 1 hr 26 min.  Basal SpO2 89.5%, low SpO2 85%.  Spent 31.2 min with SpO2 < 88%.   Please let her know that her oxygen level was low during overnight oxygen test.  Will determine during CPAP titration study whether she needs adjustment to CPAP set up, or if she needs to have supplemental oxygen added in with CPAP at night.

## 2018-03-07 NOTE — Telephone Encounter (Signed)
Attempted to call patient today regarding results. I did not receive an answer at time of call. I have left a voicemail message for pt to return call. X1  

## 2018-03-13 ENCOUNTER — Other Ambulatory Visit (HOSPITAL_COMMUNITY): Payer: Self-pay | Admitting: Internal Medicine

## 2018-03-13 MED FILL — ALPRAZolam 0.25 MG TABS: 0.25 | 15 days supply | Qty: 30 | Fill #3

## 2018-03-13 MED FILL — TORSEMIDE 20 MG TABLET: 20 | 30 days supply | Qty: 60 | Fill #0 | Status: TO

## 2018-03-13 MED FILL — LOSARTAN POTASSIUM 25 MG TA: 25 | 90 days supply | Qty: 90 | Fill #1

## 2018-03-14 NOTE — Telephone Encounter (Signed)
Left message for patient to call back for results.  

## 2018-03-17 DIAGNOSIS — R768 Other specified abnormal immunological findings in serum: Secondary | ICD-10-CM | POA: Diagnosis not present

## 2018-03-17 DIAGNOSIS — M2550 Pain in unspecified joint: Secondary | ICD-10-CM | POA: Diagnosis not present

## 2018-03-17 DIAGNOSIS — Z6821 Body mass index (BMI) 21.0-21.9, adult: Secondary | ICD-10-CM | POA: Diagnosis not present

## 2018-03-17 NOTE — Telephone Encounter (Signed)
Pt returning call

## 2018-03-17 NOTE — Telephone Encounter (Signed)
Attempted to call patient today regarding results. I did not receive an answer at time of call. I have left a voicemail message for pt to return call. X2  

## 2018-03-18 NOTE — Telephone Encounter (Signed)
Called and spoke with patient regarding results.  Informed the patient of results and recommendations today. Pt verbalized understanding and denied any questions or concerns at this time.  Nothing further needed.  

## 2018-03-19 DIAGNOSIS — L858 Other specified epidermal thickening: Secondary | ICD-10-CM | POA: Diagnosis not present

## 2018-03-19 DIAGNOSIS — L570 Actinic keratosis: Secondary | ICD-10-CM | POA: Diagnosis not present

## 2018-03-19 DIAGNOSIS — D485 Neoplasm of uncertain behavior of skin: Secondary | ICD-10-CM | POA: Diagnosis not present

## 2018-03-19 DIAGNOSIS — D692 Other nonthrombocytopenic purpura: Secondary | ICD-10-CM | POA: Diagnosis not present

## 2018-03-19 DIAGNOSIS — C44629 Squamous cell carcinoma of skin of left upper limb, including shoulder: Secondary | ICD-10-CM | POA: Diagnosis not present

## 2018-03-24 ENCOUNTER — Other Ambulatory Visit: Payer: Self-pay

## 2018-03-24 ENCOUNTER — Encounter: Payer: Self-pay | Admitting: Family Medicine

## 2018-03-24 ENCOUNTER — Ambulatory Visit (INDEPENDENT_AMBULATORY_CARE_PROVIDER_SITE_OTHER): Payer: 59 | Admitting: Family Medicine

## 2018-03-24 VITALS — BP 115/76 | HR 64 | Temp 98.3°F | Resp 16 | Ht 62.0 in | Wt 117.2 lb

## 2018-03-24 DIAGNOSIS — I4819 Other persistent atrial fibrillation: Secondary | ICD-10-CM

## 2018-03-24 DIAGNOSIS — G4733 Obstructive sleep apnea (adult) (pediatric): Secondary | ICD-10-CM

## 2018-03-24 DIAGNOSIS — F419 Anxiety disorder, unspecified: Secondary | ICD-10-CM

## 2018-03-24 DIAGNOSIS — F329 Major depressive disorder, single episode, unspecified: Secondary | ICD-10-CM | POA: Diagnosis not present

## 2018-03-24 DIAGNOSIS — F32A Depression, unspecified: Secondary | ICD-10-CM

## 2018-03-24 NOTE — Progress Notes (Signed)
   Subjective:    Patient ID: Angela Shepherd, female    DOB: 04/29/1950, 68 y.o.   MRN: [id number]  HPI Afib- pt has had cardioversion and ablation w/ Dr Curt Bears.  On Eliquis.  On Carvedilol for rate control.  Pt reports cardioversion was ineffective but feels ablation was successful.  Denies SOB, palpitations, edema.  OSA- pt was started on CPAP but at pulmonary f/u was told 'it's not working' and has an appt upcoming for a titration study.  Next step would be adding O2 or Bipap.    Depression- Pt reports feeling well.  'breathing is a lot better'.  Anxiety is '95% better'.  Taking Prozac daily.  Will take 1/4 of alprazolam to maintain good mood.  Review of Systems For ROS see HPI     Objective:   Physical Exam Vitals signs reviewed.  Constitutional:      General: She is not in acute distress.    Appearance: She is well-developed.  HENT:     Head: Normocephalic and atraumatic.  Eyes:     Conjunctiva/sclera: Conjunctivae normal.     Pupils: Pupils are equal, round, and reactive to light.  Neck:     Musculoskeletal: Normal range of motion and neck supple.     Thyroid: No thyromegaly.  Cardiovascular:     Rate and Rhythm: Normal rate and regular rhythm.     Heart sounds: Normal heart sounds. No murmur.  Pulmonary:     Effort: Pulmonary effort is normal. No respiratory distress.     Breath sounds: Normal breath sounds.  Abdominal:     General: There is no distension.     Palpations: Abdomen is soft.     Tenderness: There is no abdominal tenderness.  Lymphadenopathy:     Cervical: No cervical adenopathy.  Skin:    General: Skin is warm and dry.  Neurological:     Mental Status: She is alert and oriented to person, place, and time.  Psychiatric:        Behavior: Behavior normal.           Assessment & Plan:

## 2018-03-24 NOTE — Assessment & Plan Note (Signed)
Ongoing problem.  Pt is thrilled with the care she is getting from Dr Halford Chessman.  She is compliant w/ CPAP but is still hypoxic.  Next step is to add O2 or switch Bipap.

## 2018-03-24 NOTE — Assessment & Plan Note (Signed)
Ongoing issue.  Pt feels sxs are well controlled.  No med changes at this time.  Will follow.

## 2018-03-24 NOTE — Patient Instructions (Signed)
Schedule your complete physical in 6 months No need to repeat labs at this time Keep up the good work!  You're doing great! Call with any questions or concerns Stay Safe!!!

## 2018-03-24 NOTE — Assessment & Plan Note (Signed)
Pt reports feeling much better since her recent ablation.  Denies CP, SOB, edema, palpitations.  Has follow up pending w/ Dr Curt Bears

## 2018-03-27 ENCOUNTER — Telehealth: Payer: Self-pay | Admitting: Pulmonary Disease

## 2018-03-27 NOTE — Telephone Encounter (Signed)
She is scheduled for a CPAP titration study.  This has to be done in the sleep lab.

## 2018-03-27 NOTE — Telephone Encounter (Signed)
Called and spoke with patient she is aware and verbalized understanding. She will keep appointment nothing further needed.

## 2018-03-27 NOTE — Telephone Encounter (Signed)
Called and spoke with patient she has an appointment scheduled on April 9th but she was supposed to have a sleep study done before this. She cannot have the sleep study done for at least 4 weeks due to [place]. Patient wants to know if she has the option to do a sleep study within our office. Please advise, thank you.

## 2018-03-31 ENCOUNTER — Other Ambulatory Visit: Payer: Self-pay | Admitting: Family Medicine

## 2018-03-31 MED FILL — ALPRAZolam 0.25 MG TABS: 0.25 | 15 days supply | Qty: 30 | Fill #0

## 2018-03-31 MED FILL — FLUoxetine HCL 20 MG CAPS: 20 | 30 days supply | Qty: 30 | Fill #0

## 2018-03-31 MED FILL — MONTELUKAST SOD 10 MG TAB: 10 | 30 days supply | Qty: 30 | Fill #0

## 2018-03-31 NOTE — Telephone Encounter (Signed)
Last OV 03/24/18 Alprazolam last filled 12/26/17 #30 with 3

## 2018-04-01 ENCOUNTER — Telehealth: Payer: Self-pay | Admitting: Cardiology

## 2018-04-01 ENCOUNTER — Encounter (HOSPITAL_BASED_OUTPATIENT_CLINIC_OR_DEPARTMENT_OTHER): Payer: 59

## 2018-04-01 NOTE — Telephone Encounter (Signed)
Patient called to ask if she would still be coming in on 3/30. I told her someone would be calling her to let her know.

## 2018-04-01 NOTE — Telephone Encounter (Signed)
Pt aware Dr. Curt Bears and I are looking at the schedule and I will call her and let her know soon what we are going to do.  She understands the visit will be telephone/video and not in-office.

## 2018-04-03 ENCOUNTER — Telehealth: Payer: Self-pay | Admitting: Cardiology

## 2018-04-03 NOTE — Telephone Encounter (Signed)
Follow up:    Patient calling back concerning her appt. I told patient that it will be a telephone appt. Please call patient back.please call patient at [phone].

## 2018-04-03 NOTE — Telephone Encounter (Signed)
Called patient about her message. Informed patient that Dr. Curt Bears would call her and set this up this appointment tomorrow when she is back in the office. Patient stated she does have access to webex and her email is [email or web address] . Will forward to Dr. Macky Lower nurse and her scheduler.

## 2018-04-04 NOTE — Telephone Encounter (Signed)
Terin, CMA, called pt.  Arrangement for webex visit being arranged.

## 2018-04-07 ENCOUNTER — Telehealth (INDEPENDENT_AMBULATORY_CARE_PROVIDER_SITE_OTHER): Payer: 59 | Admitting: Cardiology

## 2018-04-07 DIAGNOSIS — I4819 Other persistent atrial fibrillation: Secondary | ICD-10-CM

## 2018-04-07 NOTE — Patient Instructions (Signed)
Medication Instructions:  Your physician recommends that you continue on your current medications as directed. Please refer to the Current Medication list given to you today.  * If you need a refill on your cardiac medications before your next appointment, please call your pharmacy.   Labwork: None ordered  Testing/Procedures: None ordered  Follow-Up: Your physician recommends that you schedule a follow-up appointment in: 3 months with Dr. Curt Bears.  The office will contact you to arrange this visit at a later time.   Thank you for choosing [facility]!!   Trinidad Curet, RN [phone]

## 2018-04-07 NOTE — Progress Notes (Signed)
Electrophysiology TeleHealth Note   Due to national recommendations of social distancing due to COVID 19, an audio/video telehealth visit is felt to be most appropriate for this patient at this time.  See MyChart message from today for the patient's consent to telehealth for [facility].   Date:  04/07/2018   ID:  Angela Shepherd, DOB 12-18-1950, MRN [id number]  Location: patient's home  Provider location: [address], [place]  Evaluation Performed: Follow-up visit  PCP:  Midge Minium, MD  Cardiologist: Bensimhon Electrophysiologist:  Dr Curt Bears  Chief Complaint: Atrial fibrillation  History of Present Illness:    Angela Shepherd is a 68 y.o. female who presents via audio/video conferencing for a telehealth visit today.  Since last being seen in our clinic, the patient reports doing very well.  Today, she denies symptoms of palpitations, chest pain, shortness of breath,  lower extremity edema, dizziness, presyncope, or syncope.  The patient is otherwise without complaint today.  The patient denies symptoms of fevers, chills, cough, or new SOB worrisome for COVID 19.  Today, denies symptoms of palpitations, chest pain, shortness of breath, orthopnea, PND, lower extremity edema, claudication, dizziness, presyncope, syncope, bleeding, or neurologic sequela. The patient is tolerating medications without difficulties.  Currently she is feeling well.  She has no chest pain or shortness of breath.  She is able to do all her daily activities without restriction.  She does have short episodes of atrial fibrillation.  She is minimally aware of this.  She does say that her shortness of breath has been much improved since her ablation.  Past Medical History:  Diagnosis Date  . Bradycardia    a. initial PPM placed 1994 at [facility] with gen change 2002. b. 1*AVB & intermittent 2nd degree AVB + CHF --> upgrade to [facility]. Jude BiV PPM 05/08/12.  . Chronic systolic CHF  (congestive heart failure) ([place])    a. EF 30-35% dx in 2011 -> most recent 40-45% by echo 04/2012. b. s/p upgrade to BiV-PPM 05/08/12. c. Med titration limited by hypotension.  . Cluster headache syndrome   . Hypertr obst cardiomyop   . Hypokalemia   . Hypotension   . Paroxysmal atrial fibrillation (HCC)    a. identified on PPM interrogation b. longest episode 1 hour; if burden increases, Jamilett Ferrante need [place]   . Pneumonia 2008  . Sleep apnea     Past Surgical History:  Procedure Laterality Date  . ATRIAL FIBRILLATION ABLATION N/A 11/15/2017   Procedure: ATRIAL FIBRILLATION ABLATION;  Surgeon: Constance Haw, MD;  Location: [facility];  Service: Cardiovascular;  Laterality: N/A;  . BI-VENTRICULAR PACEMAKER UPGRADE N/A 05/08/2012   upgrade to [facility] (CRT-P) by Dr Rayann Heman  . CARDIOVERSION N/A 09/19/2017   Procedure: CARDIOVERSION;  Surgeon: Sanda Klein, MD;  Location: [facility];  Service: Cardiovascular;  Laterality: N/A;  . [place]; 2002; 05/08/2012  . TONSILLECTOMY  ~ 1959  . TUBAL LIGATION  1984?    Current Outpatient Medications  Medication Sig Dispense Refill  . allopurinol (ZYLOPRIM) 100 MG tablet Take 0.5 tablets (50 mg total) by mouth daily. 15 tablet 2  . ALPRAZolam (XANAX) 0.25 MG tablet TAKE 1 TABLET (0.25 MG TOTAL) BY MOUTH 2 (TWO) TIMES DAILY AS NEEDED FOR ANXIETY. 30 tablet 3  . AMBULATORY NON FORMULARY MEDICATION Medication Name: 100% O2 15 leters a min for 15-20 mins  Via Nasal cannual 1 Bottle 2  . amitriptyline (ELAVIL) 10 MG  tablet Take 1 tablet (10 mg total) by mouth at bedtime. (Patient taking differently: Take 10 mg by mouth daily as needed (for cluster headaches.). ) 30 tablet 3  . aspirin 81 MG chewable tablet Chew 81 mg by mouth daily.    . budesonide-formoterol (SYMBICORT) 160-4.5 MCG/ACT inhaler Inhale 2 puffs into the lungs 2 (two) times daily. INHALE 2 PUFFS BY MOUTH INTO THE LUNGS 2 TIMES A DAY 30.6 g 1  . carvedilol (COREG) 12.5  MG tablet TAKE 1 TABLET BY MOUTH 2 TIMES DAILY WITH A MEAL. 60 tablet 3  . colchicine 0.6 MG tablet Take 1 tablet (0.6 mg total) by mouth 2 (two) times daily as needed (for gout flare ups.). 60 tablet 1  . cyclobenzaprine (FLEXERIL) 5 MG tablet TAKE 1 TABLET (5 MG TOTAL) BY MOUTH 3 (THREE) TIMES DAILY AS NEEDED FOR MUSCLE SPASMS. (Patient taking differently: Take 5 mg by mouth 3 (three) times daily as needed for muscle spasms. ) 30 tablet 2  . diclofenac sodium (VOLTAREN) 1 % GEL Apply 2 g topically 4 (four) times daily. (Patient taking differently: Apply 2 g topically 4 (four) times daily as needed (pain.). ) 3 Tube 0  . ELIQUIS 5 MG TABS tablet TAKE 1 TABLET BY MOUTH TWICE DAILY (Patient taking differently: Take 5 mg by mouth 2 (two) times daily. ) 60 tablet 5  . FLUoxetine (PROZAC) 20 MG capsule TAKE 1 CAPSULE (20 MG TOTAL) BY MOUTH DAILY. 30 capsule 3  . fluticasone (FLONASE) 50 MCG/ACT nasal spray PLACE 2 SPRAYS INTO BOTH NOSTRILS DAILY. 16 g 5  . ipratropium-albuterol (DUONEB) 0.5-2.5 (3) MG/3ML SOLN USE 1 VIAL (3ML) BY NEBULIZER EVERY 4 TO 6 HOURS AS NEEDED 270 mL 1  . losartan (COZAAR) 25 MG tablet Take 1 tablet (25 mg total) by mouth daily. (Patient taking differently: Take 25 mg by mouth daily as needed (for systolic blood pressure greater than 100). ) 30 tablet 11  . montelukast (SINGULAIR) 10 MG tablet Take 1 tablet (10 mg total) by mouth at bedtime. 30 tablet 5  . nicotine (NICODERM CQ - DOSED IN MG/24 HOURS) 14 mg/24hr patch Place 1 patch (14 mg total) onto the skin daily. (Patient taking differently: Place 14 mg onto the skin daily as needed (for smoking urges/cravings.). ) 28 patch 0  . potassium chloride SA (K-DUR,KLOR-CON) 20 MEQ tablet TAKE 1 TABLET (20 MEQ TOTAL) BY MOUTH DAILY. (Patient taking differently: Take 20 mEq by mouth at bedtime. ) 90 tablet 1  . Spacer/Aero-Holding Chambers (AEROCHAMBER MV) inhaler Use as instructed with Symbicort 1 each 0  . SPIRIVA RESPIMAT 2.5 MCG/ACT  AERS INHALE 2 PUFFS INTO THE LUNGS ONCE DAILY 12 g 1  . SUMAtriptan (IMITREX) 50 MG tablet TAKE 1 TABLET AT ONSET OF HEADACHE. MAY REPEAT IN 2 HOURS IF HEADACHE PERSISTS OR RECURS. (Patient taking differently: Take 50 mg by mouth every 2 (two) hours as needed for migraine. Take 1 tablet at onset of headache. May repeat in 2 hours if headache persists or recurs.) 10 tablet 6  . topiramate (TOPAMAX) 50 MG tablet Take 50 mg by mouth daily as needed (migraine headaches).     . torsemide (DEMADEX) 20 MG tablet TAKE 1 TABLET BY MOUTH TWICE DAILY 60 tablet 5  . traMADol (ULTRAM) 50 MG tablet Take 0.5 tablets (25 mg total) by mouth every 12 (twelve) hours as needed. 12 tablet 0  . VENTOLIN HFA 108 (90 Base) MCG/ACT inhaler INHALE 1-2 PUFFS INTO THE LUNGS EVERY 6 (SIX)  HOURS AS NEEDED FOR WHEEZING OR SHORTNESS OF BREATH. (Patient taking differently: Inhale 1-2 puffs into the lungs every 6 (six) hours as needed for wheezing or shortness of breath. ) 1 Inhaler 5   No current facility-administered medications for this visit.     Allergies:   Prednisone   Social History:  The patient  reports that she has been smoking cigarettes. She has a 15.00 pack-year smoking history. She has never used smokeless tobacco. She reports current alcohol use of about 2.0 standard drinks of alcohol per week. She reports that she does not use drugs.   Family History:  The patient's  family history includes Cancer (age of onset: 38) in her sister; Cardiomyopathy in her sister; Dementia in her mother; Diabetes in her brother and another family member; Heart disease in her sister.   ROS:  Please see the history of present illness.   All other systems are personally reviewed and negative.    Exam:    Vital Signs:  BP 120/80   Wt 114 lb (51.7 kg)   LMP  (LMP Unknown)   BMI 20.85 kg/m   Well appearing, alert and conversant, regular work of breathing,  good skin color Eyes- anicteric, neuro- grossly intact, skin- no apparent  rash or lesions or cyanosis, mouth- oral mucosa is pink   Labs/Other Tests and Data Reviewed:    Recent Labs: 06/20/2017: B Natriuretic Peptide 781.6 06/24/2017: Magnesium 2.2 08/05/2017: Pro B Natriuretic peptide (BNP) 1,698.0 02/13/2018: ALT 13; BUN 27; Creatinine, Ser 1.19; Hemoglobin 13.6; Platelets 238; Potassium 3.6; Sodium 141   Wt Readings from Last 3 Encounters:  04/07/18 114 lb (51.7 kg)  03/24/18 117 lb 4 oz (53.2 kg)  02/27/18 120 lb (54.4 kg)     Other studies personally reviewed: Additional studies/ records that were reviewed today include: ECG 12/19/17 personally reviewed  Review of the above records today demonstrates: A sense V paced  Last device remote is reviewed from [facility] PDF dated 02/24/18 which reveals normal device function, atrial fibrillation   ASSESSMENT & PLAN:    1.  Persistent atrial fibrillation/flutter: Is now status post ablation 11/15/2017.  She has had some atrial fibrillation since that time.  Currently on Eliquis.  Has continued to have short episodes of atrial fibrillation, longest lasting 24 hours.  She is minimally aware and has had much less shortness of breath.  No changes.  2.  Hypertension: Currently well controlled.  No changes.  3.  Hypertrophic cardiomyopathy: Status post [facility] Jude CRT-P.  Device functioning appropriately on most recent device check.  4.  Chronic systolic heart failure: Ejection fraction 35 to 40%.  Currently on optimal medical therapy per heart failure.  No signs of volume overload  COVID 19 screen The patient denies symptoms of COVID 19 at this time.  The importance of social distancing was discussed today.  Follow-up: 3 months  Current medicines are reviewed at length with the patient today.   The patient does not have concerns regarding her medicines.  The following changes were made today:  none  Labs/ tests ordered today include:  No orders of the defined types were placed in this encounter.    Patient  Risk:  after full review of this patients clinical status, I feel that they are at moderate risk at this time.  Today, I have spent 15 minutes with the patient with telehealth technology discussing atrial fibrillation.    Signed, Ankith Edmonston [place], MD  04/07/2018 11:42 AM  [place] [address] [phone] (office) [phone] (fax)

## 2018-04-09 ENCOUNTER — Encounter (HOSPITAL_COMMUNITY): Payer: Self-pay | Admitting: *Deleted

## 2018-04-09 NOTE — Progress Notes (Signed)
Completed pt's yearly FMLA forms for intermittent leave, forms signed by Dr Haroldine Laws and faxed to [facility]

## 2018-04-10 MED FILL — SPIRIVA RESPIMAT 2.5 MCG IN: 2.5 | 30 days supply | Qty: 4 | Fill #0

## 2018-04-10 MED FILL — TORSEMIDE 20 MG TABLET: 20 | 30 days supply | Qty: 60 | Fill #0

## 2018-04-10 MED FILL — ALLOPURINOL 100 MG TABS: 100 | 30 days supply | Qty: 15 | Fill #0

## 2018-04-17 ENCOUNTER — Telehealth: Payer: Self-pay | Admitting: Pulmonary Disease

## 2018-04-17 ENCOUNTER — Ambulatory Visit: Payer: 59 | Admitting: Pulmonary Disease

## 2018-04-17 NOTE — Telephone Encounter (Signed)
Call returned to patient, she states Dr. Halford Chessman wanted her to have another sleep study. She states she will not be able to be seen until May. She wanted to know if there was any where else she could have it done. I explained that due to the [place] pandemic all sleep studies has been suspended until further notice. Made aware as soon as things routine to normal she would be r/s. Voiced understanding. Nothing further is needed at this time.

## 2018-04-22 ENCOUNTER — Telehealth: Payer: Self-pay | Admitting: Pulmonary Disease

## 2018-04-22 DIAGNOSIS — G4733 Obstructive sleep apnea (adult) (pediatric): Secondary | ICD-10-CM

## 2018-04-22 NOTE — Telephone Encounter (Signed)
Can we pull a CPAP download, may need to decrease pressure

## 2018-04-22 NOTE — Telephone Encounter (Signed)
Order sent for pressure change and 4 week download.  Spoke with pt and relayed info.  She does wear a full face mask because she breathes through her mouth.  Told her to try it with adjustable pressure and let us know.  Titration study scheduled for May.  Nothing further needed.

## 2018-04-22 NOTE — Telephone Encounter (Signed)
Call returned to patient, she states since using her cpap she is waking up bloated. She states she feels full and has abdominal pain that wakes her up in the middle of the night. She states she typically has to get up and walk for a few hours for it to go away. She states her abdomen is distended. She looked up her symptoms and thinks she has Aerophagia from the cpap pressre. Cpap compliance report printed.   [name] please advise. Thanks.

## 2018-04-22 NOTE — Telephone Encounter (Signed)
She is having a lot of air leaks and AHI 13.7. Pressure currently set at 9cm H20. Please try auto titrate CPAP 5-15cm H20 for now. How is her mask fitting, may need a new one with her DME company or eventually a mask fitting

## 2018-04-28 ENCOUNTER — Telehealth (HOSPITAL_COMMUNITY): Payer: Self-pay | Admitting: *Deleted

## 2018-04-28 NOTE — Telephone Encounter (Signed)
Pt left VM requesting return call.  I called pt back no answer/left VM for pt to call back.

## 2018-04-30 ENCOUNTER — Other Ambulatory Visit: Payer: Self-pay | Admitting: Acute Care

## 2018-04-30 MED FILL — MONTELUKAST SOD 10 MG TAB: 10 | 30 days supply | Qty: 30 | Fill #0 | Status: TO

## 2018-04-30 MED FILL — FLUoxetine HCL 20 MG CAPS: 20 | 30 days supply | Qty: 30 | Fill #1

## 2018-04-30 MED FILL — NICOTINE 21 MG/24HR PATCH: 21 | 28 days supply | Qty: 28 | Fill #0

## 2018-05-02 ENCOUNTER — Encounter (HOSPITAL_BASED_OUTPATIENT_CLINIC_OR_DEPARTMENT_OTHER): Payer: 59

## 2018-05-06 NOTE — Progress Notes (Signed)
Spoke to pt. FMLA was denied because they have not received the paperwork. Advised pt to have FMLA forms resent and we will refill them out and fax them again.

## 2018-05-07 MED FILL — ELIQUIS 5 MG TABLET: 5 | 30 days supply | Qty: 60 | Fill #0

## 2018-05-12 MED FILL — TORSEMIDE 20 MG TABLET: 20 | 30 days supply | Qty: 60 | Fill #1 | Status: TO

## 2018-05-12 MED FILL — SPIRIVA RESPIMAT 2.5 MCG IN: 2.5 | 30 days supply | Qty: 4 | Fill #1 | Status: TO

## 2018-05-12 MED FILL — ALLOPURINOL 100 MG TABS: 100 | 30 days supply | Qty: 15 | Fill #0 | Status: TO

## 2018-05-16 ENCOUNTER — Telehealth (HOSPITAL_COMMUNITY): Payer: Self-pay

## 2018-05-16 NOTE — Telephone Encounter (Signed)
Pt contacted RN that the forms for FMLA were incomplete. Filled out required missing information and added notes. Re-faxed to [employer]. Fax confirmed. Original to be scanned.

## 2018-05-16 NOTE — Telephone Encounter (Signed)
Filled out second set of fmla paper work for [employer]. Faxed it attn: Hoyle Sauer. Fax confirmation received. Called Hoyle Sauer to confirm that she received the fax. Left voicemail. Spoke to pt and made her aware that the paper work had been faxed once again. Pt requested that she be sent a copy of the original. Sent. Original to be scanned into chart.

## 2018-05-19 ENCOUNTER — Other Ambulatory Visit (HOSPITAL_BASED_OUTPATIENT_CLINIC_OR_DEPARTMENT_OTHER): Payer: Self-pay | Admitting: Family Medicine

## 2018-05-19 DIAGNOSIS — Z1231 Encounter for screening mammogram for malignant neoplasm of breast: Secondary | ICD-10-CM

## 2018-05-22 ENCOUNTER — Other Ambulatory Visit: Payer: Self-pay | Admitting: Adult Health

## 2018-05-22 MED FILL — ALPRAZolam 0.25 MG TABS: 0.25 | 15 days supply | Qty: 30 | Fill #1 | Status: TO

## 2018-05-23 ENCOUNTER — Other Ambulatory Visit: Payer: Self-pay

## 2018-05-23 ENCOUNTER — Ambulatory Visit (INDEPENDENT_AMBULATORY_CARE_PROVIDER_SITE_OTHER): Payer: 59 | Admitting: *Deleted

## 2018-05-23 DIAGNOSIS — I428 Other cardiomyopathies: Secondary | ICD-10-CM

## 2018-05-24 LAB — CUP PACEART REMOTE DEVICE CHECK
Date Time Interrogation Session: 20200516080415
Implantable Lead Implant Date: 19940705
Implantable Lead Implant Date: 19940705
Implantable Lead Implant Date: 20140501
Implantable Lead Location: 753858
Implantable Lead Location: 753859
Implantable Lead Location: 753860
Implantable Lead Model: 4024
Implantable Lead Model: 4524
Implantable Pulse Generator Implant Date: 20140501
Pulse Gen Model: 3210
Pulse Gen Serial Number: 2936617

## 2018-05-24 MED FILL — FLUoxetine HCL 20 MG CAPS: 20 | 30 days supply | Qty: 30 | Fill #2

## 2018-05-26 ENCOUNTER — Inpatient Hospital Stay (HOSPITAL_COMMUNITY): Admission: RE | Admit: 2018-05-26 | Payer: 59 | Source: Ambulatory Visit

## 2018-05-26 ENCOUNTER — Other Ambulatory Visit (HOSPITAL_COMMUNITY)
Admission: RE | Admit: 2018-05-26 | Discharge: 2018-05-26 | Disposition: A | Discharge status: Home / Self Care | Payer: 59 | Source: Ambulatory Visit | Attending: Adult Health | Admitting: Adult Health

## 2018-05-26 ENCOUNTER — Other Ambulatory Visit (HOSPITAL_COMMUNITY): Admission: RE | Admit: 2018-05-26 | Payer: 59 | Source: Ambulatory Visit

## 2018-05-26 DIAGNOSIS — Z01812 Encounter for preprocedural laboratory examination: Secondary | ICD-10-CM | POA: Diagnosis not present

## 2018-05-26 DIAGNOSIS — Z1159 Encounter for screening for other viral diseases: Secondary | ICD-10-CM | POA: Diagnosis not present

## 2018-05-27 ENCOUNTER — Encounter: Payer: Self-pay | Admitting: Cardiology

## 2018-05-27 LAB — NOVEL CORONAVIRUS, NAA (HOSP ORDER, SEND-OUT TO REF LAB; TAT 18-24 HRS): SARS-CoV-2, NAA: NOT DETECTED

## 2018-05-27 NOTE — Progress Notes (Signed)
Remote pacemaker transmission.   

## 2018-05-30 ENCOUNTER — Other Ambulatory Visit: Payer: Self-pay

## 2018-05-30 ENCOUNTER — Ambulatory Visit (HOSPITAL_BASED_OUTPATIENT_CLINIC_OR_DEPARTMENT_OTHER): Payer: 59 | Attending: Adult Health | Admitting: Pulmonary Disease

## 2018-05-30 VITALS — Ht 62.0 in | Wt 110.0 lb

## 2018-05-30 DIAGNOSIS — G4733 Obstructive sleep apnea (adult) (pediatric): Secondary | ICD-10-CM | POA: Insufficient documentation

## 2018-05-31 MED FILL — ELIQUIS 5 MG TABLET: 5 | 30 days supply | Qty: 60 | Fill #1

## 2018-06-03 ENCOUNTER — Other Ambulatory Visit (HOSPITAL_BASED_OUTPATIENT_CLINIC_OR_DEPARTMENT_OTHER): Payer: Self-pay

## 2018-06-03 ENCOUNTER — Telehealth: Payer: Self-pay | Admitting: Student

## 2018-06-03 ENCOUNTER — Other Ambulatory Visit: Payer: Self-pay

## 2018-06-03 DIAGNOSIS — G4733 Obstructive sleep apnea (adult) (pediatric): Secondary | ICD-10-CM

## 2018-06-03 DIAGNOSIS — G4734 Idiopathic sleep related nonobstructive alveolar hypoventilation: Secondary | ICD-10-CM

## 2018-06-03 MED FILL — MONTELUKAST SOD 10 MG TAB: 10 | 30 days supply | Qty: 30 | Fill #0

## 2018-06-03 NOTE — Telephone Encounter (Signed)
Received alert for AF > burden. Presenting rhythm AFib with Vs/BiV pacing. Patient with known history of AF (s/p PVI 11/2017), now with persistent AFib. Onset on 05/31/18 @ 0942, rates controlled. Normal device function. BiV pacing 97%. [place]- Eliquis.  Pt denies symptoms and states she had a sleep study the evening of 05/30/18 into 05/31/18. Denies SOB or fluid accumulation. Pt knows to call back with any worsening symptoms of HF.    Legrand Como [address] [place], PA-C 06/03/2018 12:56 PM

## 2018-06-04 ENCOUNTER — Encounter (HOSPITAL_COMMUNITY): Payer: Self-pay | Admitting: *Deleted

## 2018-06-04 NOTE — Progress Notes (Signed)
Updated FMLA forms to include dates of OV's for since Nov 2018 and start and end time of leave: 04/18/2018 through 04/17/2019, forms faxed to [employer] at [phone]

## 2018-06-05 MED FILL — ALLOPURINOL 100 MG TABS: 100 | 30 days supply | Qty: 15 | Fill #0

## 2018-06-10 ENCOUNTER — Telehealth: Payer: Self-pay

## 2018-06-10 MED FILL — SYMBICORT 160-4.5 MCG INH: 160-4.5 | 30 days supply | Qty: 10 | Fill #1

## 2018-06-10 NOTE — Telephone Encounter (Signed)
ATC Patient.  Left message for Patient to call back. 

## 2018-06-11 MED FILL — NICOTINE 21 MG/24HR PATCH: 21 | 28 days supply | Qty: 28 | Fill #0

## 2018-06-11 NOTE — Telephone Encounter (Signed)
Pt had CPAP titration performed 05/30/2018. Pt is calling requesting to know if the results are available. [name], please advise in this for pt. Thanks!

## 2018-06-11 NOTE — Telephone Encounter (Signed)
It has not been officially read by MD yet. Forwarding to TP to follow-up

## 2018-06-13 NOTE — Telephone Encounter (Signed)
Pt inquiring results of titration study. She says her cardiologist informed her afib out of control. Please advise.

## 2018-06-13 NOTE — Telephone Encounter (Signed)
Can we continue to follow up on this , due to COVID 19 it is taking longer for these to be read, can we double check this

## 2018-06-16 ENCOUNTER — Encounter: Payer: 59 | Admitting: Physician Assistant

## 2018-06-16 MED FILL — TORSEMIDE 20 MG TABLET: 20 | 90 days supply | Qty: 180 | Fill #0

## 2018-06-16 NOTE — Telephone Encounter (Signed)
Dr. Halford Chessman, please advise on the results of pt's titration study. Thanks!

## 2018-06-17 DIAGNOSIS — M2550 Pain in unspecified joint: Secondary | ICD-10-CM | POA: Diagnosis not present

## 2018-06-17 DIAGNOSIS — R768 Other specified abnormal immunological findings in serum: Secondary | ICD-10-CM | POA: Diagnosis not present

## 2018-06-19 ENCOUNTER — Telehealth: Payer: Self-pay | Admitting: Pulmonary Disease

## 2018-06-19 NOTE — Telephone Encounter (Signed)
Rec'd FMLA paperwork from [employer] via fax - fwd to [employer] via interoffice mail -pr

## 2018-06-23 MED FILL — FLUoxetine HCL 20 MG CAPS: 20 | 30 days supply | Qty: 30 | Fill #3

## 2018-06-24 NOTE — Telephone Encounter (Signed)
Spoke with TP about results. Per patient's chart, the results are not yet available. I have asked Dr. Annamaria Boots if he would be willing to read/interpret results on behalf of Dr. Halford Chessman. He stated yes but he will need to have the data send to him so he can review the results.   [facility] at [phone] but had to leave a message to have someone to call back. Will leave this in triage until someone calls back.

## 2018-06-24 NOTE — Telephone Encounter (Signed)
Send this to VS but due to him still working at hospital due to [place], have not received a response yet from him.  Tammy, please advise if you are able to see if the cpap titration has been resulted yet. Thanks!

## 2018-06-25 ENCOUNTER — Telehealth: Payer: Self-pay | Admitting: Pulmonary Disease

## 2018-06-25 DIAGNOSIS — G4733 Obstructive sleep apnea (adult) (pediatric): Secondary | ICD-10-CM

## 2018-06-25 NOTE — Telephone Encounter (Signed)
Bipap 05/30/18 >> Bipap 12/8 cm H2O >> AHI 9.5; more centrals at lower and higher pressures.   Please arrange for her to be set up with Bipap 12/8 cm H2O with heated humidity.  She needs ROV 2 months after starting Bipap.

## 2018-06-25 NOTE — Telephone Encounter (Signed)
FYI: CY this is VS pt that is awaiting results of titration study since 05/30/18.  [place] spoke with [facility]. She will re-route titrations study from VS box to CY box then send CY a message to read.

## 2018-06-25 NOTE — Procedures (Signed)
    Patient Name: Angela Shepherd, Angela Shepherd Date: 05/30/2018 Gender: Female D.O.B: January 26, 1950 Age (years): 67 Referring Provider: Lynelle Smoke Parrett Height (inches): 64 Interpreting Physician: [name] MD, ABSM Weight (lbs): 118 RPSGT: Earney [place] BMI: 22 MRN: [id number] Neck Size: 13.00 <br> <br> CLINICAL INFORMATION 68 year old with history of OSA, COPD with emphysema.  She was tried on CPAP.  She had continued oxygen desaturation.  She is referred for a Bipap titration study.    Date of HST: AHI 16.9, SpO2 low 82%. From 08/16/17.  SLEEP STUDY TECHNIQUE As per the AASM Manual for the Scoring of Sleep and Associated Events v2.3 (April 2016) with a hypopnea requiring 4% desaturations.  The channels recorded and monitored were frontal, central and occipital EEG, electrooculogram (EOG), submentalis EMG (chin), nasal and oral airflow, thoracic and abdominal wall motion, anterior tibialis EMG, snore microphone, electrocardiogram, and pulse oximetry. Bilevel positive airway pressure (BPAP) was initiated at the beginning of the study and titrated to treat sleep-disordered breathing.  MEDICATIONS Medications self-administered by patient taken the night of the study : N/A  RESPIRATORY PARAMETERS Optimal IPAP Pressure (cm): 12 AHI at Optimal Pressure (/hr) 9.5 Optimal EPAP Pressure (cm): 8   Overall Minimal O2 (%): 87.0 Minimal O2 at Optimal Pressure (%): 87.0 SLEEP ARCHITECTURE Start Time: 9:34:22 PM Stop Time: 4:37:26 AM Total Time (min): 423.1 Total Sleep Time (min): 308.5 Sleep Latency (min): 63.4 Sleep Efficiency (%): 72.9% REM Latency (min): 165.5 WASO (min): 51.1 Stage N1 (%): 3.1% Stage N2 (%): 75.2% Stage N3 (%): 0.0% Stage R (%): 21.7 Supine (%): 30.66 Arousal Index (/hr): 10.7     CARDIAC DATA The 2 lead EKG demonstrated pacemaker generated. The mean heart rate was 62.1 beats per minute. Other EKG findings include: None. LEG MOVEMENT DATA The total Periodic Limb  Movements of Sleep (PLMS) were 0. The PLMS index was 0.0. A PLMS index of <15 is considered normal in adults.  IMPRESSIONS - Optimal Bipap setting appeared to be 12/8 cm H2O.  At lower and higher pressures she developed more frequent central apneas associated with arousals. - She did not require supplemental oxygen during this study. DIAGNOSIS - Obstructive Sleep Apnea (327.23 [G47.33 ICD-10]) RECOMMENDATIONS - Trial of BiPAP therapy on 12/8 cm H2O with a Small size [employer]Paykel Full Face Mask Simplus mask and heated humidification.  [Electronically signed] 06/25/2018 04:28 PM  [name] MD, [place], American Board of Sleep Medicine   NPI: [id number]

## 2018-06-26 MED FILL — SPIRIVA RESPIMAT 2.5 MCG IN: 2.5 | 30 days supply | Qty: 4 | Fill #0

## 2018-06-26 NOTE — Telephone Encounter (Signed)
Noted. We will await a response from Dr. Halford Chessman in regards to the results.

## 2018-06-26 NOTE — Telephone Encounter (Signed)
I was texted yesterday by Kia at [facility]. She was able to reach Dr Halford Chessman and I understand he will take care of this study report.

## 2018-06-26 NOTE — Telephone Encounter (Signed)
Dr. Annamaria Boots, please advise if you have received the results on the cpap titration study. Pt is a VS pt but since he is not currently at the office, they were going to change it to where you could be able to read and interpret the results.

## 2018-06-27 ENCOUNTER — Telehealth: Payer: Self-pay | Admitting: Emergency Medicine

## 2018-06-27 MED FILL — MONTELUKAST SOD 10 MG TAB: 10 | 30 days supply | Qty: 30 | Fill #1

## 2018-06-27 NOTE — Telephone Encounter (Signed)
Rec'd fmla paperwork via fax from [place] to [employer] via interoffice mail -pr

## 2018-06-30 MED FILL — ELIQUIS 5 MG TABLET: 5 | 30 days supply | Qty: 60 | Fill #2

## 2018-06-30 NOTE — Telephone Encounter (Signed)
Pt returning call for results and can be reached @ [phone].Angela Shepherd

## 2018-06-30 NOTE — Telephone Encounter (Signed)
Called spoke with patient and discussed BiPAP titration results as stated by VS.  Patient voiced her understanding and denied any further questions/concerns at this time.  Order placed to [facility] for BiPAP at 12/8cm heated humidity and ov in 2 months after start.  Nothing further needed; will sign off.

## 2018-06-30 NOTE — Telephone Encounter (Signed)
ATC pt, no answer. Left message for pt to call back.   [name], MD    4:31 PM Note   Bipap 05/30/18 >> Bipap 12/8 cm H2O >> AHI 9.5; more centrals at lower and higher pressures.   Please arrange for her to be set up with Bipap 12/8 cm H2O with heated humidity.  She needs ROV 2 months after starting Bipap.

## 2018-07-01 ENCOUNTER — Telehealth: Payer: Self-pay | Admitting: *Deleted

## 2018-07-01 NOTE — Telephone Encounter (Signed)
Results were given to pt in another open encounter so closing this encounter.

## 2018-07-01 NOTE — Telephone Encounter (Signed)
Calling patient today to discuss upcoming appointment.  We are currently trying to limit exposure to the virus that causes COVID-19 by seeing patients at home rather than in the office. We would like to schedule this appointment as a Psychologist, counselling. Unable to reach patient.  LVMTCB Calling patient to confirm VIRTUAL appointment on 6/25

## 2018-07-02 ENCOUNTER — Telehealth: Payer: Self-pay | Admitting: Pulmonary Disease

## 2018-07-03 ENCOUNTER — Other Ambulatory Visit: Payer: Self-pay

## 2018-07-03 ENCOUNTER — Telehealth (INDEPENDENT_AMBULATORY_CARE_PROVIDER_SITE_OTHER): Payer: 59 | Admitting: Cardiology

## 2018-07-03 DIAGNOSIS — I4819 Other persistent atrial fibrillation: Secondary | ICD-10-CM | POA: Diagnosis not present

## 2018-07-03 NOTE — Progress Notes (Signed)
Electrophysiology TeleHealth Note   Due to national recommendations of social distancing due to COVID 19, an audio/video telehealth visit is felt to be most appropriate for this patient at this time.  See Epic message for the patient's consent to telehealth for [facility].   Date:  07/03/2018   ID:  Angela Shepherd, DOB 19-Feb-1950, MRN [id number]  Location: patient's home  Provider location: [address] [place]  Evaluation Performed: Follow-up visit  PCP:  Midge Minium, MD  Cardiologist:  Bensimhon Electrophysiologist:  Dr Curt Bears  Chief Complaint:  AF  History of Present Illness:    Angela Shepherd is a 68 y.o. female who presents via audio/video conferencing for a telehealth visit today.  Since last being seen in our clinic, the patient reports doing very well.  Today, she denies symptoms of palpitations, chest pain, shortness of breath,  lower extremity edema, dizziness, presyncope, or syncope.  The patient is otherwise without complaint today.  The patient denies symptoms of fevers, chills, cough, or new SOB worrisome for COVID 19.  She has a history of hypertrophic cardiomyopathy, bradycardia status post pacemaker 1990 at [facility], hypertension, tobacco abuse, upgrade of her device in 2014 to a CRT-P.  She also has atrial fibrillation and had ablation 11/18/2017, but unfortunately went back into atrial fibrillation.  Today, denies symptoms of palpitations, chest pain, shortness of breath, orthopnea, PND, lower extremity edema, claudication, dizziness, presyncope, syncope, bleeding, or neurologic sequela. The patient is tolerating medications without difficulties. She is currently feeling well without complaint. She has been switched from CPAP to BiPAP. otherwise no issues.   Past Medical History:  Diagnosis Date  . Bradycardia    a. initial PPM placed 1994 at [facility] with gen change 2002. b. 1*AVB & intermittent 2nd degree AVB +  CHF --> upgrade to [facility]. Jude BiV PPM 05/08/12.  . Chronic systolic CHF (congestive heart failure) ([place])    a. EF 30-35% dx in 2011 -> most recent 40-45% by echo 04/2012. b. s/p upgrade to BiV-PPM 05/08/12. c. Med titration limited by hypotension.  . Cluster headache syndrome   . Hypertr obst cardiomyop   . Hypokalemia   . Hypotension   . Paroxysmal atrial fibrillation (HCC)    a. identified on PPM interrogation b. longest episode 1 hour; if burden increases, Destina Mantei need [place]   . Pneumonia 2008  . Sleep apnea     Past Surgical History:  Procedure Laterality Date  . ATRIAL FIBRILLATION ABLATION N/A 11/15/2017   Procedure: ATRIAL FIBRILLATION ABLATION;  Surgeon: Constance Haw, MD;  Location: [place] [facility];  Service: Cardiovascular;  Laterality: N/A;  . BI-VENTRICULAR PACEMAKER UPGRADE N/A 05/08/2012   upgrade to SJM Anthem (CRT-P) by Dr Rayann Heman  . CARDIOVERSION N/A 09/19/2017   Procedure: CARDIOVERSION;  Surgeon: Sanda Klein, MD;  Location: [place];  Service: Cardiovascular;  Laterality: N/A;  . [place]; 2002; 05/08/2012  . TONSILLECTOMY  ~ 1959  . TUBAL LIGATION  1984?    Current Outpatient Medications  Medication Sig Dispense Refill  . allopurinol (ZYLOPRIM) 100 MG tablet Take 0.5 tablets (50 mg total) by mouth daily. 15 tablet 2  . ALPRAZolam (XANAX) 0.25 MG tablet TAKE 1 TABLET (0.25 MG TOTAL) BY MOUTH 2 (TWO) TIMES DAILY AS NEEDED FOR ANXIETY. 30 tablet 3  . AMBULATORY NON FORMULARY MEDICATION Medication Name: 100% O2 15 leters a min for 15-20 mins  Via Nasal cannual 1 Bottle 2  . amitriptyline (  ELAVIL) 10 MG tablet Take 1 tablet (10 mg total) by mouth at bedtime. (Patient taking differently: Take 10 mg by mouth daily as needed (for cluster headaches.). ) 30 tablet 3  . aspirin 81 MG chewable tablet Chew 81 mg by mouth daily.    . budesonide-formoterol (SYMBICORT) 160-4.5 MCG/ACT inhaler Inhale 2 puffs into the lungs 2 (two) times daily. INHALE 2 PUFFS BY  MOUTH INTO THE LUNGS 2 TIMES A DAY 30.6 g 1  . carvedilol (COREG) 12.5 MG tablet TAKE 1 TABLET BY MOUTH 2 TIMES DAILY WITH A MEAL. 60 tablet 3  . colchicine 0.6 MG tablet Take 1 tablet (0.6 mg total) by mouth 2 (two) times daily as needed (for gout flare ups.). 60 tablet 1  . cyclobenzaprine (FLEXERIL) 5 MG tablet TAKE 1 TABLET (5 MG TOTAL) BY MOUTH 3 (THREE) TIMES DAILY AS NEEDED FOR MUSCLE SPASMS. (Patient taking differently: Take 5 mg by mouth 3 (three) times daily as needed for muscle spasms. ) 30 tablet 2  . diclofenac sodium (VOLTAREN) 1 % GEL Apply 2 g topically 4 (four) times daily. (Patient taking differently: Apply 2 g topically 4 (four) times daily as needed (pain.). ) 3 Tube 0  . ELIQUIS 5 MG TABS tablet TAKE 1 TABLET BY MOUTH TWICE DAILY (Patient taking differently: Take 5 mg by mouth 2 (two) times daily. ) 60 tablet 5  . FLUoxetine (PROZAC) 20 MG capsule TAKE 1 CAPSULE (20 MG TOTAL) BY MOUTH DAILY. 30 capsule 3  . fluticasone (FLONASE) 50 MCG/ACT nasal spray PLACE 2 SPRAYS INTO BOTH NOSTRILS DAILY. 16 g 5  . ipratropium-albuterol (DUONEB) 0.5-2.5 (3) MG/3ML SOLN USE 1 VIAL (3ML) BY NEBULIZER EVERY 4 TO 6 HOURS AS NEEDED 270 mL 1  . losartan (COZAAR) 25 MG tablet Take 1 tablet (25 mg total) by mouth daily. (Patient taking differently: Take 25 mg by mouth daily as needed (for systolic blood pressure greater than 100). ) 30 tablet 11  . montelukast (SINGULAIR) 10 MG tablet TAKE 1 TABLET BY MOUTH AT BEDTIME 30 tablet 2  . nicotine (NICODERM CQ - DOSED IN MG/24 HOURS) 14 mg/24hr patch Place 1 patch (14 mg total) onto the skin daily. (Patient taking differently: Place 14 mg onto the skin daily as needed (for smoking urges/cravings.). ) 28 patch 0  . potassium chloride SA (K-DUR,KLOR-CON) 20 MEQ tablet TAKE 1 TABLET (20 MEQ TOTAL) BY MOUTH DAILY. (Patient taking differently: Take 20 mEq by mouth at bedtime. ) 90 tablet 1  . Spacer/Aero-Holding Chambers (AEROCHAMBER MV) inhaler Use as instructed  with Symbicort 1 each 0  . SPIRIVA RESPIMAT 2.5 MCG/ACT AERS INHALE 2 PUFFS INTO THE LUNGS ONCE DAILY 12 g 1  . SUMAtriptan (IMITREX) 50 MG tablet TAKE 1 TABLET AT ONSET OF HEADACHE. MAY REPEAT IN 2 HOURS IF HEADACHE PERSISTS OR RECURS. (Patient taking differently: Take 50 mg by mouth every 2 (two) hours as needed for migraine. Take 1 tablet at onset of headache. May repeat in 2 hours if headache persists or recurs.) 10 tablet 6  . topiramate (TOPAMAX) 50 MG tablet Take 50 mg by mouth daily as needed (migraine headaches).     . torsemide (DEMADEX) 20 MG tablet TAKE 1 TABLET BY MOUTH TWICE DAILY 60 tablet 5  . VENTOLIN HFA 108 (90 Base) MCG/ACT inhaler INHALE 1-2 PUFFS INTO THE LUNGS EVERY 6 (SIX) HOURS AS NEEDED FOR WHEEZING OR SHORTNESS OF BREATH. (Patient taking differently: Inhale 1-2 puffs into the lungs every 6 (six) hours as needed  for wheezing or shortness of breath. ) 1 Inhaler 5   No current facility-administered medications for this visit.     Allergies:   Prednisone   Social History:  The patient  reports that she has been smoking cigarettes. She has a 15.00 pack-year smoking history. She has never used smokeless tobacco. She reports current alcohol use of about 2.0 standard drinks of alcohol per week. She reports that she does not use drugs.   Family History:  The patient's  family history includes Cancer (age of onset: 51) in her sister; Cardiomyopathy in her sister; Dementia in her mother; Diabetes in her brother and another family member; Heart disease in her sister.   ROS:  Please see the history of present illness.   All other systems are personally reviewed and negative.    Exam:    Vital Signs:  BP 96/67   Pulse 83   Resp (!) 95   Wt 114 lb (51.7 kg)   LMP  (LMP Unknown)   BMI 20.85 kg/m   Well appearing, alert and conversant, regular work of breathing,  good skin color Eyes- anicteric, neuro- grossly intact, skin- no apparent rash or lesions or cyanosis, mouth- oral  mucosa is pink   Labs/Other Tests and Data Reviewed:    Recent Labs: 08/05/2017: Pro B Natriuretic peptide (BNP) 1,698.0 02/13/2018: ALT 13; BUN 27; Creatinine, Ser 1.19; Hemoglobin 13.6; Platelets 238; Potassium 3.6; Sodium 141   Wt Readings from Last 3 Encounters:  07/03/18 114 lb (51.7 kg)  05/30/18 110 lb (49.9 kg)  04/07/18 114 lb (51.7 kg)     Other studies personally reviewed: Additional studies/ records that were reviewed today include: ECG 12/19/2017 personally reviewed Review of the above records today demonstrates: Atrial sensed ventricular paced  Last device remote is reviewed from [facility] PDF dated 05/24/2018 which reveals normal device function, 3.5% atrial fibrillation   ASSESSMENT & PLAN:    1.  Resistant atrial fibrillation/flutter: Status post ablation 11/15/2017.  Currently on Eliquis.  AF burden on device at 3.5% since November 2019. No changes.  This patients CHA2DS2-VASc Score and unadjusted Ischemic Stroke Rate (% per year) is equal to 3.2 % stroke rate/year from a score of 3  Above score calculated as 1 point each if present [CHF, HTN, DM, Vascular=MI/PAD/Aortic Plaque, Age if 65-74, or Female] Above score calculated as 2 points each if present [Age > 75, or Stroke/TIA/TE]   2.  Hypertrophic cardiomyopathy: Status post [facility] Jude CRT-P.  Device functioning appropriately.  3.  Chronic systolic heart failure: Ejection fraction 35 to 40%.  No obvious signs of volume overload.  Plan per primary cardiology.   COVID 19 screen The patient denies symptoms of COVID 19 at this time.  The importance of social distancing was discussed today.  Follow-up:  3 months  Current medicines are reviewed at length with the patient today.   The patient does not have concerns regarding her medicines.  The following changes were made today:  none  Labs/ tests ordered today include:  No orders of the defined types were placed in this encounter.    Patient Risk:  after full  review of this patients clinical status, I feel that they are at moderate risk at this time.  Today, I have spent 5 minutes with the patient with telehealth technology discussing AF .    Signed, Chyla Schlender Meredith Leeds, MD  07/03/2018 10:04 AM     [place] [address] (  [phone] (office) [phone] (fax)

## 2018-07-04 ENCOUNTER — Ambulatory Visit (HOSPITAL_BASED_OUTPATIENT_CLINIC_OR_DEPARTMENT_OTHER)
Admission: RE | Admit: 2018-07-04 | Discharge: 2018-07-04 | Disposition: A | Discharge status: Home / Self Care | Payer: 59 | Source: Ambulatory Visit | Attending: Family Medicine | Admitting: Family Medicine

## 2018-07-04 ENCOUNTER — Other Ambulatory Visit: Payer: Self-pay

## 2018-07-04 DIAGNOSIS — Z1231 Encounter for screening mammogram for malignant neoplasm of breast: Secondary | ICD-10-CM | POA: Diagnosis not present

## 2018-07-08 NOTE — Telephone Encounter (Signed)
Rec'd completed paperwork - fwd to [employer] via interoffice mail -pr  

## 2018-07-08 NOTE — Telephone Encounter (Signed)
Please advise if these forms were given to Dr. Halford Chessman. Thanks!

## 2018-07-15 ENCOUNTER — Encounter: Payer: Self-pay | Admitting: Physician Assistant

## 2018-07-15 ENCOUNTER — Other Ambulatory Visit: Payer: Self-pay

## 2018-07-15 ENCOUNTER — Ambulatory Visit (INDEPENDENT_AMBULATORY_CARE_PROVIDER_SITE_OTHER): Payer: 59 | Admitting: Physician Assistant

## 2018-07-15 VITALS — BP 110/71 | HR 106 | Temp 98.0°F | Resp 16 | Ht 62.0 in | Wt 114.2 lb

## 2018-07-15 DIAGNOSIS — I421 Obstructive hypertrophic cardiomyopathy: Secondary | ICD-10-CM

## 2018-07-15 DIAGNOSIS — M810 Age-related osteoporosis without current pathological fracture: Secondary | ICD-10-CM

## 2018-07-15 DIAGNOSIS — N182 Chronic kidney disease, stage 2 (mild): Secondary | ICD-10-CM

## 2018-07-15 DIAGNOSIS — I4819 Other persistent atrial fibrillation: Secondary | ICD-10-CM | POA: Diagnosis not present

## 2018-07-15 DIAGNOSIS — I10 Essential (primary) hypertension: Secondary | ICD-10-CM | POA: Diagnosis not present

## 2018-07-15 DIAGNOSIS — Z0000 Encounter for general adult medical examination without abnormal findings: Secondary | ICD-10-CM

## 2018-07-15 DIAGNOSIS — G4733 Obstructive sleep apnea (adult) (pediatric): Secondary | ICD-10-CM | POA: Diagnosis not present

## 2018-07-15 DIAGNOSIS — Z0001 Encounter for general adult medical examination with abnormal findings: Secondary | ICD-10-CM | POA: Diagnosis not present

## 2018-07-15 DIAGNOSIS — I5032 Chronic diastolic (congestive) heart failure: Secondary | ICD-10-CM | POA: Diagnosis not present

## 2018-07-15 DIAGNOSIS — M109 Gout, unspecified: Secondary | ICD-10-CM | POA: Diagnosis not present

## 2018-07-15 NOTE — Progress Notes (Signed)
Patient presents to clinic today for annual exam.  Patient is fasting for labs.  Acute Concerns: Patient with history of gout of feet. Is currently on a prophylactic allopurinol daily, 50 mg. Notes taking as directed but with flare of the past 2 days in her R foot. Notes redness and swelling. Has been watching diet but has not been hydrating well. Notes her Sports Med provider wanted to increase allopurinol but wanted reassessment of her kidney function. Notes she did not follow-up regarding this yet.   Chronic Issues:  HTN: checks BP every morning, based on reading will take medication if  <100, does not take. Asympt with low readings     COPD: medications helping to manage, no exacerbations. Denies excessive SOB   Health Maintenance: Colonoscopy -- 2014, polyps  Mammogram -- within the last two weeks  Bone Density -- Hx of Osteoporosis. Due for repeat Bone density.  Past Medical History:  Diagnosis Date  . Bradycardia    a. initial PPM placed [facility] at [facility] with gen change 2002. b. 1*AVB & intermittent 2nd degree AVB + CHF --> upgrade to [facility]. Jude BiV PPM 05/08/12.  . Chronic systolic CHF (congestive heart failure) ([place])    a. EF 30-35% dx in 2011 -> most recent 40-45% by echo 04/2012. b. s/p upgrade to BiV-PPM 05/08/12. c. Med titration limited by hypotension.  . Cluster headache syndrome   . Hypertr obst cardiomyop   . Hypokalemia   . Hypotension   . Paroxysmal atrial fibrillation (HCC)    a. identified on PPM interrogation b. longest episode 1 hour; if burden increases, will need [place]   . Pneumonia 2008  . Sleep apnea     Past Surgical History:  Procedure Laterality Date  . ATRIAL FIBRILLATION ABLATION N/A 11/15/2017   Procedure: ATRIAL FIBRILLATION ABLATION;  Surgeon: Constance Haw, MD;  Location: [facility] [facility];  Service: Cardiovascular;  Laterality: N/A;  . BI-VENTRICULAR PACEMAKER UPGRADE N/A 05/08/2012   upgrade to SJM Anthem (CRT-P) by Dr Rayann Heman  .  CARDIOVERSION N/A 09/19/2017   Procedure: CARDIOVERSION;  Surgeon: Sanda Klein, MD;  Location: [place] [facility];  Service: Cardiovascular;  Laterality: N/A;  . [place]; 2002; 05/08/2012  . TONSILLECTOMY  ~ 1959  . TUBAL LIGATION  1984?    Current Outpatient Medications on File Prior to Visit  Medication Sig Dispense Refill  . allopurinol (ZYLOPRIM) 100 MG tablet Take 0.5 tablets (50 mg total) by mouth daily. 15 tablet 2  . ALPRAZolam (XANAX) 0.25 MG tablet TAKE 1 TABLET (0.25 MG TOTAL) BY MOUTH 2 (TWO) TIMES DAILY AS NEEDED FOR ANXIETY. 30 tablet 3  . AMBULATORY NON FORMULARY MEDICATION Medication Name: 100% O2 15 leters a min for 15-20 mins  Via Nasal cannual 1 Bottle 2  . amitriptyline (ELAVIL) 10 MG tablet Take 1 tablet (10 mg total) by mouth at bedtime. (Patient taking differently: Take 10 mg by mouth daily as needed (for cluster headaches.). ) 30 tablet 3  . aspirin 81 MG chewable tablet Chew 81 mg by mouth daily.    . budesonide-formoterol (SYMBICORT) 160-4.5 MCG/ACT inhaler Inhale 2 puffs into the lungs 2 (two) times daily. INHALE 2 PUFFS BY MOUTH INTO THE LUNGS 2 TIMES A DAY 30.6 g 1  . carvedilol (COREG) 12.5 MG tablet TAKE 1 TABLET BY MOUTH 2 TIMES DAILY WITH A MEAL. 60 tablet 3  . colchicine 0.6 MG tablet Take 1 tablet (0.6 mg total) by mouth 2 (two) times daily as  needed (for gout flare ups.). 60 tablet 1  . cyclobenzaprine (FLEXERIL) 5 MG tablet TAKE 1 TABLET (5 MG TOTAL) BY MOUTH 3 (THREE) TIMES DAILY AS NEEDED FOR MUSCLE SPASMS. (Patient taking differently: Take 5 mg by mouth 3 (three) times daily as needed for muscle spasms. ) 30 tablet 2  . diclofenac sodium (VOLTAREN) 1 % GEL Apply 2 g topically 4 (four) times daily. (Patient taking differently: Apply 2 g topically 4 (four) times daily as needed (pain.). ) 3 Tube 0  . ELIQUIS 5 MG TABS tablet TAKE 1 TABLET BY MOUTH TWICE DAILY (Patient taking differently: Take 5 mg by mouth 2 (two) times daily. ) 60 tablet 5  .  FLUoxetine (PROZAC) 20 MG capsule TAKE 1 CAPSULE (20 MG TOTAL) BY MOUTH DAILY. 30 capsule 3  . fluticasone (FLONASE) 50 MCG/ACT nasal spray PLACE 2 SPRAYS INTO BOTH NOSTRILS DAILY. 16 g 5  . ipratropium-albuterol (DUONEB) 0.5-2.5 (3) MG/3ML SOLN USE 1 VIAL (3ML) BY NEBULIZER EVERY 4 TO 6 HOURS AS NEEDED 270 mL 1  . losartan (COZAAR) 25 MG tablet Take 1 tablet (25 mg total) by mouth daily. (Patient taking differently: Take 25 mg by mouth daily as needed (for systolic blood pressure greater than 100). ) 30 tablet 11  . montelukast (SINGULAIR) 10 MG tablet TAKE 1 TABLET BY MOUTH AT BEDTIME 30 tablet 2  . nicotine (NICODERM CQ - DOSED IN MG/24 HOURS) 14 mg/24hr patch Place 1 patch (14 mg total) onto the skin daily. (Patient taking differently: Place 14 mg onto the skin daily as needed (for smoking urges/cravings.). ) 28 patch 0  . potassium chloride SA (K-DUR,KLOR-CON) 20 MEQ tablet TAKE 1 TABLET (20 MEQ TOTAL) BY MOUTH DAILY. (Patient taking differently: Take 20 mEq by mouth at bedtime. ) 90 tablet 1  . Spacer/Aero-Holding Chambers (AEROCHAMBER MV) inhaler Use as instructed with Symbicort 1 each 0  . SPIRIVA RESPIMAT 2.5 MCG/ACT AERS INHALE 2 PUFFS INTO THE LUNGS ONCE DAILY 12 g 1  . SUMAtriptan (IMITREX) 50 MG tablet TAKE 1 TABLET AT ONSET OF HEADACHE. MAY REPEAT IN 2 HOURS IF HEADACHE PERSISTS OR RECURS. (Patient taking differently: Take 50 mg by mouth every 2 (two) hours as needed for migraine. Take 1 tablet at onset of headache. May repeat in 2 hours if headache persists or recurs.) 10 tablet 6  . topiramate (TOPAMAX) 50 MG tablet Take 50 mg by mouth daily as needed (migraine headaches).     . torsemide (DEMADEX) 20 MG tablet TAKE 1 TABLET BY MOUTH TWICE DAILY 60 tablet 5  . VENTOLIN HFA 108 (90 Base) MCG/ACT inhaler INHALE 1-2 PUFFS INTO THE LUNGS EVERY 6 (SIX) HOURS AS NEEDED FOR WHEEZING OR SHORTNESS OF BREATH. (Patient taking differently: Inhale 1-2 puffs into the lungs every 6 (six) hours as needed  for wheezing or shortness of breath. ) 1 Inhaler 5   No current facility-administered medications on file prior to visit.     Allergies  Allergen Reactions  . Prednisone Shortness Of Breath    CHF     Family History  Problem Relation Age of Onset  . Heart disease Sister   . Dementia Mother   . Cancer Sister 32       uterine  . Diabetes Other   . Cardiomyopathy Sister   . Diabetes Brother   . Colon cancer Neg Hx   . Esophageal cancer Neg Hx   . Stomach cancer Neg Hx   . Rectal cancer Neg Hx  Social History   Socioeconomic History  . Marital status: Widowed    Spouse name: Not on file  . Number of children: 2  . Years of education: Not on file  . Highest education level: Not on file  Occupational History  . Occupation: [employer]    Employer: [employer]  . Financial resource strain: Not on file  . Food insecurity    Worry: Not on file    Inability: Not on file  . Transportation needs    Medical: Not on file    Non-medical: Not on file  Tobacco Use  . Smoking status: Current Every Day Smoker    Packs/day: 0.50    Years: 30.00    Pack years: 15.00    Types: Cigarettes    Last attempt to quit: 01/08/2010    Years since quitting: 8.5  . Smokeless tobacco: Never Used  Substance and Sexual Activity  . Alcohol use: Yes    Alcohol/week: 2.0 standard drinks    Types: 2 Glasses of wine per week  . Drug use: No  . Sexual activity: Never  Lifestyle  . Physical activity    Days per week: Not on file    Minutes per session: Not on file  . Stress: Not on file  Relationships  . Social Herbalist on phone: Not on file    Gets together: Not on file    Attends religious service: Not on file    Active member of club or organization: Not on file    Attends meetings of clubs or organizations: Not on file    Relationship status: Not on file  . Intimate partner violence    Fear of current or ex partner: Not on file     Emotionally abused: Not on file    Physically abused: Not on file    Forced sexual activity: Not on file  Other Topics Concern  . Not on file  Social History Narrative   ** Merged History Encounter **        Review of Systems  Constitutional: Negative for fever and weight loss.  HENT: Negative for ear discharge, ear pain, hearing loss and tinnitus.   Eyes: Negative for blurred vision, double vision, photophobia and pain.  Respiratory: Negative for cough and shortness of breath.   Cardiovascular: Negative for chest pain and palpitations.  Gastrointestinal: Negative for abdominal pain, blood in stool, constipation, diarrhea, heartburn, melena, nausea and vomiting.  Genitourinary: Negative for dysuria, flank pain, frequency, hematuria and urgency.  Musculoskeletal: Positive for joint pain. Negative for falls.  Neurological: Negative for dizziness, loss of consciousness and headaches.  Endo/Heme/Allergies: Negative for environmental allergies.  Psychiatric/Behavioral: Negative for depression, hallucinations, substance abuse and suicidal ideas. The patient is not nervous/anxious and does not have insomnia.    BP 110/71   Pulse (!) 106   Temp 98 F (36.7 C) (Skin)   Resp 16   Ht 5\' 2"  (1.575 m)   Wt 114 lb 4 oz (51.8 kg)   LMP  (LMP Unknown)   SpO2 95%   BMI 20.90 kg/m   Physical Exam  Recent Results (from the past 2160 hour(s))  CUP PACEART REMOTE DEVICE CHECK     Status: None   Collection Time: 05/23/18 11:29 AM  Result Value Ref Range   Pulse Generator Manufacturer [employer]    Date Time Interrogation Session [id number]    Pulse Gen Model 3210 Anthem RF    Pulse  Gen Serial Number [id number]    Clinic Name [facility]    Implantable Pulse Generator Type Cardiac Resynch Therapy Pacemaker    Implantable Pulse Generator Implant Date [id number]    Implantable Lead Manufacturer [employer]    Implantable Lead Model 1258T QuickFlex     Implantable Lead Serial Number [id number]     Implantable Lead Implant Date 18841660    Implantable Lead Location Detail 1 Lateral Wall    Implantable Lead Location [id number]    Implantable Lead Manufacturer [employer]    Implantable Lead Model 4524 EasyTrak 3 LV-1, 80 cm    Implantable Lead Serial Number [id number]    Implantable Lead Implant Date [id number]    Implantable Lead Location [phone]    Implantable Lead Manufacturer MERM    Implantable Lead Model 4024 CapSure SP    Implantable Lead Serial Number [id number]    Implantable Lead Implant Date [id number]    Implantable Lead Location [id number]   Novel Coronavirus, NAA (hospital order; send-out to ref lab)     Status: None   Collection Time: 05/26/18  1:28 PM   Specimen: Nasopharyngeal Swab; Respiratory  Result Value Ref Range   SARS-CoV-2, NAA NOT DETECTED NOT DETECTED    Comment: (NOTE) Testing was performed using the cobas(R) SARS-CoV-2 test. This test was developed and its performance characteristics determined by [employer]. This test has not been FDA cleared or approved. This test has been authorized by FDA under an Emergency Use Authorization (EUA). This test is only authorized for the duration of time the declaration that circumstances exist justifying the authorization of the emergency use of in vitro diagnostic tests for detection of SARS-CoV-2 virus and/or diagnosis of COVID-19 infection under section 564(b)(1) of the Act, 21 U.S.C. 073XTG-6(Y)(6), unless the authorization is terminated or revoked sooner. When diagnostic testing is negative, the possibility of a false negative result should be considered in the context of a patient's recent exposures and the presence of clinical signs and symptoms consistent with COVID-19. An individual without symptoms of COVID-19 and who is not shedding SARS-CoV-2 virus would expect to have  a negative (not detected) result in this assay. Performed At: [facility] [place], [place] [id number] Rush Farmer  MD JJ:[id number]    Coronavirus Source NASOPHARYNGEAL     Comment: Performed at [facility], [place] [address], [place], [place] [address]    Assessment/Plan: 1. Visit for preventive health examination Depression screen negative. Health Maintenance reviewed. Preventive schedule discussed and handout given in AVS. Will obtain fasting labs today.  - CBC with Differential/Platelet - Comprehensive metabolic panel - Hemoglobin A1c - Lipid panel - TSH  2. Essential hypertension 3. Chronic diastolic heart failure ([place]) 4. HOCM (hypertrophic obstructive cardiomyopathy) (HCC) 5. Persistent atrial fibrillation Managed by Cardiology and HF clinic. Continue management per specialists as she is euvolemic at present.  6. OSA (obstructive sleep apnea) Using CPAP nightly.   7. Age-related osteoporosis without current pathological fracture Discussed vitamin D supplementation. Repeat Bone Density placed. - DG Bone Density; Future  8. Stage 2 chronic kidney disease Repeat labs today to ensure stability and that medication dose changes are not needed  9. Acute gout involving toe of right foot, unspecified cause Has Rx for colchicine that she restarted last night. Continue BID to help with resolution. Supportive measures reviewed. Will check renal function and uric acid levels. Will increase allopurinol dose once flare is resolved if appropriate.  - Uric acid   Leeanne Rio, PA-C

## 2018-07-15 NOTE — Patient Instructions (Addendum)
Please go to the lab for blood work.   Our office will call you with your results unless you have chosen to receive results via MyChart.  If your blood work is normal we will follow-up each year for physicals and as scheduled for chronic medical problems.  If anything is abnormal we will treat accordingly and get you in for a follow-up.  Please continue the Colchicine twice daily to help with this acute flare. Once renal function is reassessed we will adjust your regimen further.  I will work with Dr. Barbaraann Barthel regarding this.   Preventive Care 31 Years and Older, Female Preventive care refers to lifestyle choices and visits with your health care provider that can promote health and wellness. This includes:  A yearly physical exam. This is also called an annual well check.  Regular dental and eye exams.  Immunizations.  Screening for certain conditions.  Healthy lifestyle choices, such as diet and exercise. What can I expect for my preventive care visit? Physical exam Your health care provider will check:  Height and weight. These may be used to calculate body mass index (BMI), which is a measurement that tells if you are at a healthy weight.  Heart rate and blood pressure.  Your skin for abnormal spots. Counseling Your health care provider may ask you questions about:  Alcohol, tobacco, and drug use.  Emotional well-being.  Home and relationship well-being.  Sexual activity.  Eating habits.  History of falls.  Memory and ability to understand (cognition).  Work and work Statistician.  Pregnancy and menstrual history. What immunizations do I need?  Influenza (flu) vaccine  This is recommended every year. Tetanus, diphtheria, and pertussis (Tdap) vaccine  You may need a Td booster every 10 years. Varicella (chickenpox) vaccine  You may need this vaccine if you have not already been vaccinated. Zoster (shingles) vaccine  You may need this after age 76.  Pneumococcal conjugate (PCV13) vaccine  One dose is recommended after age 48. Pneumococcal polysaccharide (PPSV23) vaccine  One dose is recommended after age 78. Measles, mumps, and rubella (MMR) vaccine  You may need at least one dose of MMR if you were born in 1957 or later. You may also need a second dose. Meningococcal conjugate (MenACWY) vaccine  You may need this if you have certain conditions. Hepatitis A vaccine  You may need this if you have certain conditions or if you travel or work in places where you may be exposed to hepatitis A. Hepatitis B vaccine  You may need this if you have certain conditions or if you travel or work in places where you may be exposed to hepatitis B. Haemophilus influenzae type b (Hib) vaccine  You may need this if you have certain conditions. You may receive vaccines as individual doses or as more than one vaccine together in one shot (combination vaccines). Talk with your health care provider about the risks and benefits of combination vaccines. What tests do I need? Blood tests  Lipid and cholesterol levels. These may be checked every 5 years, or more frequently depending on your overall health.  Hepatitis C test.  Hepatitis B test. Screening  Lung cancer screening. You may have this screening every year starting at age 40 if you have a 30-pack-year history of smoking and currently smoke or have quit within the past 15 years.  Colorectal cancer screening. All adults should have this screening starting at age 17 and continuing until age 65. Your health care provider may recommend screening  at age 51 if you are at increased risk. You will have tests every 1-10 years, depending on your results and the type of screening test.  Diabetes screening. This is done by checking your blood sugar (glucose) after you have not eaten for a while (fasting). You may have this done every 1-3 years.  Mammogram. This may be done every 1-2 years. Talk with  your health care provider about how often you should have regular mammograms.  BRCA-related cancer screening. This may be done if you have a family history of breast, ovarian, tubal, or peritoneal cancers. Other tests  Sexually transmitted disease (STD) testing.  Bone density scan. This is done to screen for osteoporosis. You may have this done starting at age 18. Follow these instructions at home: Eating and drinking  Eat a diet that includes fresh fruits and vegetables, whole grains, lean protein, and low-fat dairy products. Limit your intake of foods with high amounts of sugar, saturated fats, and salt.  Take vitamin and mineral supplements as recommended by your health care provider.  Do not drink alcohol if your health care provider tells you not to drink.  If you drink alcohol: ? Limit how much you have to 0-1 drink a day. ? Be aware of how much alcohol is in your drink. In the [place] one drink equals one 12 oz bottle of beer (355 mL), one 5 oz glass of wine (148 mL), or one 1 oz glass of hard liquor (44 mL). Lifestyle  Take daily care of your teeth and gums.  Stay active. Exercise for at least 30 minutes on 5 or more days each week.  Do not use any products that contain nicotine or tobacco, such as cigarettes, e-cigarettes, and chewing tobacco. If you need help quitting, ask your health care provider.  If you are sexually active, practice safe sex. Use a condom or other form of protection in order to prevent STIs (sexually transmitted infections).  Talk with your health care provider about taking a low-dose aspirin or statin. What's next?  Go to your health care provider once a year for a well check visit.  Ask your health care provider how often you should have your eyes and teeth checked.  Stay up to date on all vaccines. This information is not intended to replace advice given to you by your health care provider. Make sure you discuss any questions you have with your  health care provider. Document Released: 01/21/2015 Document Revised: 12/19/2017 Document Reviewed: 12/19/2017 Elsevier Patient Education  [employer]. we can determine further changes to your chronic regimen.  I am contacting Dr. Vena Rua office about last colonoscopy and concern for missed follow-up. Will let you know once I have talked to him.

## 2018-07-16 LAB — HEMOGLOBIN A1C
Hgb A1c MFr Bld: 5.5 % of total Hgb (ref <5.7)
Mean Plasma Glucose: 111 (calc)
eAG (mmol/L): 6.2 (calc)

## 2018-07-16 LAB — LIPID PANEL
Cholesterol: 193 mg/dL (ref <200)
HDL: 60 mg/dL (ref >=50)
LDL Cholesterol (Calc): 118 mg/dL (calc) — ABNORMAL HIGH
Non-HDL Cholesterol (Calc): 133 mg/dL (calc) — ABNORMAL HIGH (ref <130)
Total CHOL/HDL Ratio: 3.2 (calc) (ref <5.0)
Triglycerides: 63 mg/dL (ref <150)

## 2018-07-16 LAB — CBC WITH DIFFERENTIAL/PLATELET
Absolute Monocytes: 493 cells/uL (ref 200–950)
Basophils Absolute: 73 cells/uL (ref 0–200)
Basophils Relative: 1.3 %
Eosinophils Absolute: 112 cells/uL (ref 15–500)
Eosinophils Relative: 2 %
HCT: 43.8 % (ref 35.0–45.0)
Hemoglobin: 15 g/dL (ref 11.7–15.5)
Lymphs Abs: 1350 cells/uL (ref 850–3900)
MCH: 33.5 pg — ABNORMAL HIGH (ref 27.0–33.0)
MCHC: 34.2 g/dL (ref 32.0–36.0)
MCV: 97.8 fL (ref 80.0–100.0)
MPV: 10.8 fL (ref 7.5–12.5)
Monocytes Relative: 8.8 %
Neutro Abs: 3573 cells/uL (ref 1500–7800)
Neutrophils Relative %: 63.8 %
Platelets: 210 10*3/uL (ref 140–400)
RBC: 4.48 10*6/uL (ref 3.80–5.10)
RDW: 13.5 % (ref 11.0–15.0)
Total Lymphocyte: 24.1 %
WBC: 5.6 10*3/uL (ref 3.8–10.8)

## 2018-07-16 LAB — COMPREHENSIVE METABOLIC PANEL
AG Ratio: 1.4 (calc) (ref 1.0–2.5)
ALT: 16 U/L (ref 6–29)
AST: 18 U/L (ref 10–35)
Albumin: 4.1 g/dL (ref 3.6–5.1)
Alkaline phosphatase (APISO): 96 U/L (ref 37–153)
BUN/Creatinine Ratio: 17 (calc) (ref 6–22)
BUN: 18 mg/dL (ref 7–25)
CO2: 26 mmol/L (ref 20–32)
Calcium: 9.3 mg/dL (ref 8.6–10.4)
Chloride: 102 mmol/L (ref 98–110)
Creat: 1.04 mg/dL — ABNORMAL HIGH (ref 0.50–0.99)
Globulin: 2.9 g/dL (calc) (ref 1.9–3.7)
Glucose, Bld: 85 mg/dL (ref 65–99)
Potassium: 3.9 mmol/L (ref 3.5–5.3)
Sodium: 140 mmol/L (ref 135–146)
Total Bilirubin: 0.7 mg/dL (ref 0.2–1.2)
Total Protein: 7 g/dL (ref 6.1–8.1)

## 2018-07-16 LAB — URIC ACID: Uric Acid, Serum: 8.6 mg/dL — ABNORMAL HIGH (ref 2.5–7.0)

## 2018-07-16 LAB — TSH: TSH: 1.46 mIU/L (ref 0.40–4.50)

## 2018-07-18 ENCOUNTER — Other Ambulatory Visit: Payer: Self-pay | Admitting: Emergency Medicine

## 2018-07-18 DIAGNOSIS — M109 Gout, unspecified: Secondary | ICD-10-CM

## 2018-07-18 DIAGNOSIS — N182 Chronic kidney disease, stage 2 (mild): Secondary | ICD-10-CM

## 2018-07-21 ENCOUNTER — Other Ambulatory Visit: Payer: Self-pay | Admitting: Adult Health

## 2018-07-21 MED FILL — SPIRIVA RESPIMAT 2.5 MCG IN: 2.5 | 30 days supply | Qty: 4 | Fill #1

## 2018-07-21 MED FILL — SYMBICORT 160-4.5 MCG INH: 160-4.5 | 30 days supply | Qty: 10 | Fill #0

## 2018-07-21 MED FILL — ALPRAZolam 0.25 MG TABS: 0.25 | 15 days supply | Qty: 30 | Fill #0

## 2018-07-25 DIAGNOSIS — G4733 Obstructive sleep apnea (adult) (pediatric): Secondary | ICD-10-CM | POA: Diagnosis not present

## 2018-07-29 MED FILL — ALLOPURINOL 100 MG TABS: 100 | 30 days supply | Qty: 15 | Fill #1

## 2018-07-30 ENCOUNTER — Encounter: Payer: Self-pay | Admitting: Family Medicine

## 2018-08-05 NOTE — Telephone Encounter (Signed)
Received denial letter sent by patient. I called and left a detailed message for her examiner to call our office back. Per her chart, we last received forms back in June 2020 and these forms were sent back to [employer] within a week. I will keep the denial letter in my look-at folder for follow up.

## 2018-08-06 ENCOUNTER — Other Ambulatory Visit: Payer: Self-pay | Admitting: Family Medicine

## 2018-08-06 ENCOUNTER — Other Ambulatory Visit: Payer: Self-pay | Admitting: Acute Care

## 2018-08-06 DIAGNOSIS — L821 Other seborrheic keratosis: Secondary | ICD-10-CM | POA: Diagnosis not present

## 2018-08-06 DIAGNOSIS — L820 Inflamed seborrheic keratosis: Secondary | ICD-10-CM | POA: Diagnosis not present

## 2018-08-06 DIAGNOSIS — D692 Other nonthrombocytopenic purpura: Secondary | ICD-10-CM | POA: Diagnosis not present

## 2018-08-06 DIAGNOSIS — L814 Other melanin hyperpigmentation: Secondary | ICD-10-CM | POA: Diagnosis not present

## 2018-08-06 DIAGNOSIS — L570 Actinic keratosis: Secondary | ICD-10-CM | POA: Diagnosis not present

## 2018-08-06 MED FILL — FLUoxetine HCL 20 MG CAPS: 20 | 30 days supply | Qty: 30 | Fill #0

## 2018-08-06 MED FILL — ALLOPURINOL 100 MG TABS: 100 | 30 days supply | Qty: 15 | Fill #1

## 2018-08-06 MED FILL — MONTELUKAST SOD 10 MG TAB: 10 | 30 days supply | Qty: 30 | Fill #0

## 2018-08-08 MED FILL — NICOTINE 21 MG/24HR PATCH: 21 | 28 days supply | Qty: 28 | Fill #0

## 2018-08-18 MED FILL — SPIRIVA RESPIMAT 2.5 MCG IN: 2.5 | 30 days supply | Qty: 4 | Fill #2

## 2018-08-19 ENCOUNTER — Telehealth: Payer: Self-pay | Admitting: Cardiology

## 2018-08-19 MED ORDER — METOPROLOL SUCCINATE ER 50 MG PO TB24: 50.0000 mg | ORAL_TABLET | Freq: Two times a day (BID) | ORAL | 3 refills | Status: DC

## 2018-08-19 MED FILL — METOPROLOL SUCCINATE ER 50: 50 | 30 days supply | Qty: 60 | Fill #0

## 2018-08-19 NOTE — Telephone Encounter (Signed)
New message:     Patient calling concerning some medication. Please call patient back.

## 2018-08-19 NOTE — Telephone Encounter (Signed)
Pt reports that her wellness coach called and was going over her medications with her.  Told her that Carvedilol and Symbicort should not be taken together. Discussed w/ pharmD and Dr. Curt Bears.  Pt advised this is not as concerning as wellness coach made it out to be.   To see if improvement in breathing issues will switch to different BB.  Explained that she needs to be on BB d/t HF Order to stop Carvedilol and start Toprol XL 50 mg BID. Will send information of this to pt via MyChart. Patient verbalized understanding and agreeable to plan.   Pt made aware I would forward this to Dr. Haroldine Laws for his FYI and if he has different advisement.

## 2018-08-20 ENCOUNTER — Telehealth (INDEPENDENT_AMBULATORY_CARE_PROVIDER_SITE_OTHER): Payer: 59 | Admitting: Adult Health

## 2018-08-20 ENCOUNTER — Encounter: Payer: Self-pay | Admitting: Adult Health

## 2018-08-20 DIAGNOSIS — J449 Chronic obstructive pulmonary disease, unspecified: Secondary | ICD-10-CM

## 2018-08-20 DIAGNOSIS — R9389 Abnormal findings on diagnostic imaging of other specified body structures: Secondary | ICD-10-CM

## 2018-08-20 DIAGNOSIS — R918 Other nonspecific abnormal finding of lung field: Secondary | ICD-10-CM

## 2018-08-20 DIAGNOSIS — Z720 Tobacco use: Secondary | ICD-10-CM

## 2018-08-20 DIAGNOSIS — G4733 Obstructive sleep apnea (adult) (pediatric): Secondary | ICD-10-CM | POA: Diagnosis not present

## 2018-08-20 MED FILL — ALPRAZolam 0.25 MG TABS: 0.25 | 15 days supply | Qty: 30 | Fill #1

## 2018-08-20 NOTE — Addendum Note (Signed)
Addended by: Parke Poisson E on: 08/20/2018 12:12 PM   Modules accepted: Orders

## 2018-08-20 NOTE — Telephone Encounter (Signed)
Agree. Bisoprolol most specific b-blocker so can consider that if needed.

## 2018-08-20 NOTE — Patient Instructions (Signed)
Continue on Symbicort and Spiriva , after use.  Great job not smoking .  Continue on BIPAP At bedtime   CT chest in 3 months.  Follow up with Dr. Halford Chessman or Saydie Gerdts NP in 3 months after CT chest  Please contact office for sooner follow up if symptoms do not improve or worsen or seek emergency care

## 2018-08-20 NOTE — Progress Notes (Signed)
Virtual Visit via Video Note  I connected with Angela Shepherd on 08/20/18 at  9:00 AM EDT by a video enabled telemedicine application and verified that I am speaking with the correct person using two identifiers.  Location: Patient: Home  Provider: Office    I discussed the limitations of evaluation and management by telemedicine and the availability of in person appointments. The patient expressed understanding and agreed to proceed.  History of Present Illness: 68 year old female former smoker (quit 07/12/18)  followed for COPD and obstructive sleep apnea, abnormal CT chest with micro-nodularity noted Medical history significant for congestive heart failure, A. fib on Eliquis Works for [employer] in [employer]   [name] is a 6 month follow up for COPD and OSA . Patient has underlying severe COPD with emphysema.  Her maintenance regimen includes Spiriva and Symbicort.  She says overall her breathing has been doing well . Gets winded with activities on occasion. Did have episode of breathing issues with "spasm" when she was outside at church in May. Says took a while for rescue inhaler to work. No further episodes since May .  She quit smoking 07/12/18. Congratulated on smoking cessation.  She denies flare of cough wheezing or shortness of breath.  Patient has moderate sleep apnea.  Despite excellent compliance she continued to have a number of events on CPAP.  She was set up for a CPAP/BiPAP titration which showed Bipap titration 05/30/18 >> Bipap 12/8 cm H2O >> AHI 9.5; more centrals at lower and higher pressures. Says since starting on BiPAP she feels so much better.  Feels that it has really made a big difference.  She feels more rested.  Sleeps longer.  BiPAP download shows excellent compliance with 100% usage.  Daily average usage at 9 hours.  She is on IPAP 12, EPAP 8.  AHI 4.2.  Minimum central events.  Patient has some micro-nodularity bilateral apices on CT chest November 2019.   Possibly some smoker bronchiolitis.  She has some chronic pleural thickening with subpleural opacities in the right lung noted since 2018.  Autoimmune work-up was unrevealing except for positive rheumatoid factor.  She was referred to rheumatology.  Says that she was seen by rheumatology earlier this year.  In route out for autoimmune disorder.  Was recommended to have better control of her underlying gout.  She did have some hypopigmented lesions on her skin.  She was referred to dermatology.  Patient says she was cleared from psoriasis or eczema.  She did have some additional precancerous lesions that were removed.  Observations/Objective:  TEST/EVENTS :  PFT 07/29/17 >> FEV1 0.84 (41%), FEV1% 59, DLCO 41%  Chest imaging:  Cardiac CT 11/11/17>>emphysema, Rtpleural thickening with calcification, new 4 mm nodule in RLL, new 3 mm nodule LUL. CT chest 02/07/18 >> pleural thickening, apical micronodularity  Sleep tests:  HST 08/16/17 >> AHI 16.9, SpO2 low 82%, Spent 308.3 min with SpO2 < 88% CPAP 09/24/17 >> CPAP 8 cm H2O, centrals with higher pressures CPAP 11/15/17 to 02/12/18 >>used on 85 of 90 nights with average 5 hrs 30 min. Average AHI 13.4 with CPAP 8 cm H2O. Bipap titration 05/30/18 >> Bipap 12/8 cm H2O >> AHI 9.5; more centrals at lower and higher pressures.  Cardiac tests:  Echo 06/21/17 >> EF 35 to 40%, mild AR, mild/mod MR, PAS 41 mmHg   Assessment and Plan: COPD well-controlled on Symbicort and Spiriva.  Congratulated on smoking cessation  History of tobacco abuse now quit smoking since July  4.  Congratulated on smoking cessation  Obstructive sleep apnea with excellent compliance.  Patient had persistent events on CPAP.  BiPAP titration study showed improved control.  She has now been switched to BiPAP and is doing exceptionally well.  Download shows excellent compliance and control.  Abnormal CT chest with biapical micro-nodularity possibly secondary to underlying smokers  bronchiolitis. This was first seen in November 2019.  Rheumatology referral with reported no underlying autoimmune disorder.  We will follow-up CT chest in 3 months.  Once the CT is completed.  She would be a good candidate on return to refer to the low-dose CT screening program  Plan  Patient Instructions  Continue on Symbicort and Spiriva , after use.  Great job not smoking .  Continue on BIPAP At bedtime   CT chest in 3 months.  Follow up with Angela Shepherd or Angela Glendening NP in 3 months after CT chest  Please contact office for sooner follow up if symptoms do not improve or worsen or seek emergency care       Follow Up Instructions: Follow up with Angela Shepherd  In 3 months    I discussed the assessment and treatment plan with the patient. The patient was provided an opportunity to ask questions and all were answered. The patient agreed with the plan and demonstrated an understanding of the instructions.   The patient was advised to call back or seek an in-person evaluation if the symptoms worsen or if the condition fails to improve as anticipated.  I provided 26  minutes of non-face-to-face time during this encounter.   Rexene Edison, NP

## 2018-08-22 ENCOUNTER — Ambulatory Visit (INDEPENDENT_AMBULATORY_CARE_PROVIDER_SITE_OTHER): Payer: 59 | Admitting: *Deleted

## 2018-08-22 DIAGNOSIS — I420 Dilated cardiomyopathy: Secondary | ICD-10-CM

## 2018-08-22 DIAGNOSIS — R55 Syncope and collapse: Secondary | ICD-10-CM | POA: Diagnosis not present

## 2018-08-22 LAB — CUP PACEART REMOTE DEVICE CHECK
Battery Remaining Longevity: 38 mo
Battery Remaining Percentage: 46 %
Battery Voltage: 2.86 V
Brady Statistic AP VP Percent: 31 %
Brady Statistic AP VS Percent: 1 %
Brady Statistic AS VP Percent: 68 %
Brady Statistic AS VS Percent: 1 %
Brady Statistic RA Percent Paced: 21 %
Brady Statistic RV Percent Paced: 81 %
Date Time Interrogation Session: 20200810234948
Implantable Lead Implant Date: 19940705
Implantable Lead Implant Date: 19940705
Implantable Lead Implant Date: 20140501
Implantable Lead Location: 753858
Implantable Lead Location: 753859
Implantable Lead Location: 753860
Implantable Lead Model: 4024
Implantable Lead Model: 4524
Implantable Pulse Generator Implant Date: 20140501
Lead Channel Impedance Value: 380 Ohm
Lead Channel Impedance Value: 410 Ohm
Lead Channel Impedance Value: 630 Ohm
Lead Channel Pacing Threshold Amplitude: 1.125 V
Lead Channel Pacing Threshold Pulse Width: 0.4 ms
Lead Channel Sensing Intrinsic Amplitude: 4.8 mV
Lead Channel Setting Pacing Amplitude: 2 V
Lead Channel Setting Pacing Amplitude: 2.125
Lead Channel Setting Pacing Amplitude: 2.5 V
Lead Channel Setting Pacing Pulse Width: 0.4 ms
Lead Channel Setting Pacing Pulse Width: 1 ms
Lead Channel Setting Sensing Sensitivity: 2 mV
Pulse Gen Model: 3210
Pulse Gen Serial Number: 2936617

## 2018-08-25 DIAGNOSIS — G4733 Obstructive sleep apnea (adult) (pediatric): Secondary | ICD-10-CM | POA: Diagnosis not present

## 2018-08-31 NOTE — Progress Notes (Signed)
Reviewed and agree with assessment/plan.   Irisha Grandmaison, MD [facility] 01/04/2016, 12:24 PM Pager:  [phone]  

## 2018-09-01 NOTE — Progress Notes (Signed)
Remote pacemaker transmission.   

## 2018-09-04 ENCOUNTER — Other Ambulatory Visit: Payer: Self-pay | Admitting: Family Medicine

## 2018-09-04 ENCOUNTER — Other Ambulatory Visit: Payer: Self-pay | Admitting: Adult Health

## 2018-09-04 MED FILL — FLUoxetine HCL 20 MG CAPS: 20 | 30 days supply | Qty: 30 | Fill #1

## 2018-09-04 MED FILL — ALLOPURINOL 100 MG TABS: 100 | 30 days supply | Qty: 15 | Fill #0

## 2018-09-08 ENCOUNTER — Other Ambulatory Visit: Payer: Self-pay | Admitting: Adult Health

## 2018-09-08 ENCOUNTER — Other Ambulatory Visit: Payer: Self-pay | Admitting: Family Medicine

## 2018-09-08 MED FILL — ALPRAZolam 0.25 MG TABS: 0.25 | 15 days supply | Qty: 30 | Fill #0

## 2018-09-08 NOTE — Telephone Encounter (Signed)
Last OV 07/15/18 Alprazolam last filled 03/31/18 #30 with 3

## 2018-09-09 MED ORDER — BUDESONIDE-FORMOTEROL FUMARATE 160-4.5 MCG/ACT IN AERO: INHALATION_SPRAY | RESPIRATORY_TRACT | 2 refills | Status: DC

## 2018-09-09 MED FILL — SYMBICORT 160-4.5 MCG INH: 160-4.5 | 30 days supply | Qty: 10 | Fill #0

## 2018-09-09 MED FILL — MONTELUKAST SOD 10 MG TAB: 10 | 30 days supply | Qty: 30 | Fill #1

## 2018-09-10 ENCOUNTER — Telehealth: Payer: Self-pay | Admitting: *Deleted

## 2018-09-10 ENCOUNTER — Other Ambulatory Visit (HOSPITAL_COMMUNITY): Payer: Self-pay | Admitting: Internal Medicine

## 2018-09-10 MED FILL — TORSEMIDE 20 MG TABLET: 20 | 30 days supply | Qty: 60 | Fill #0

## 2018-09-10 NOTE — Telephone Encounter (Signed)
Pt reports severe heartburn since first dose of Toprol. She is also experiencing dry mouth and vivid/crazy dreams. Aware I will forward this to Dr. Curt Bears for advisement   (Dr. Curt Bears, please see 8/11 telephone note.  Bensimhon suggested another option several weeks ago, if needed)

## 2018-09-10 NOTE — Telephone Encounter (Signed)
Per result note from recent scheduled remote transmission, Dr. Curt Bears recommended OV to discuss DCCV. Spoke with patient. She is agreeable to appointment with Dr. Curt Bears, but requests a virtual visit (preferably via MyChart). Scheduled for 09/17/18 at 3:30pm. Advised that someone will call back if appointment type/method needs to be changed prior to visit. Pt is agreeable to this plan. No additional questions at this time.

## 2018-09-10 NOTE — Telephone Encounter (Signed)
Follow up    Pt c/o medication issue:  1. Name of Medication: metoprolol   2. How are you currently taking this medication (dosage and times per day)? 50 mg 1 tab twice daily  3. Are you having a reaction (difficulty breathing--STAT)? heartburn  4. What is your medication issue? Pt said she was changed from carvedilol to this medication per her lung doctor. She said that she has had crazy dreams, dry mouth, and severe heartburn. She wants to stop this medication but didn't want to do it without telling any one.

## 2018-09-10 NOTE — Telephone Encounter (Signed)
Per Dr. Curt Bears, stop Toprol, start Bisoprolol 5 mg. Left message asking pt to call the office to discuss/review advisement  (as I am out of the office until 9/9, forwarding to triage to advise pt when she calls back)

## 2018-09-11 MED ORDER — BISOPROLOL FUMARATE 5 MG PO TABS: 5.0000 mg | ORAL_TABLET | Freq: Every day | ORAL | 3 refills | Status: DC

## 2018-09-11 MED FILL — BISOPROLOL FUMARATE 5 MG TA: 5 | 90 days supply | Qty: 90 | Fill #0

## 2018-09-11 NOTE — Telephone Encounter (Signed)
° ° °  Please return call to patient at [phone]

## 2018-09-11 NOTE — Telephone Encounter (Signed)
I spoke to the patient with Dr Macky Lower recommendation.  She verbalized understanding.

## 2018-09-17 ENCOUNTER — Other Ambulatory Visit: Payer: Self-pay

## 2018-09-17 ENCOUNTER — Telehealth (INDEPENDENT_AMBULATORY_CARE_PROVIDER_SITE_OTHER): Payer: 59 | Admitting: Cardiology

## 2018-09-17 DIAGNOSIS — I4819 Other persistent atrial fibrillation: Secondary | ICD-10-CM

## 2018-09-17 NOTE — H&P (View-Only) (Signed)
Electrophysiology TeleHealth Note   Due to national recommendations of social distancing due to COVID 19, an audio/video telehealth visit is felt to be most appropriate for this patient at this time.  See Epic message for the patient's consent to telehealth for [facility].   Date:  09/17/2018   ID:  Angela Shepherd, DOB October 30, 1950, MRN [id number]  Location: patient's home  Provider location: [address] [place]  Evaluation Performed: Follow-up visit  PCP:  Midge Minium, MD  Cardiologist: Bensimhon Electrophysiologist:  Dr Curt Bears  Chief Complaint: Atrial fibrillation  History of Present Illness:    Angela Shepherd is a 68 y.o. female who presents via audio/video conferencing for a telehealth visit today.  Since last being seen in our clinic, the patient reports doing very well.  Today, she denies symptoms of palpitations, chest pain, shortness of breath,  lower extremity edema, dizziness, presyncope, or syncope.  The patient is otherwise without complaint today.  The patient denies symptoms of fevers, chills, cough, or new SOB worrisome for COVID 19.  He has a history of hypertrophic cardiomyopathy, bradycardia status post pacemaker in 1990, hypertension, tobacco abuse, upgrade of pacemaker to CRT-P in 2014.  She also has atrial fibrillation and has ablation 11/18/2017.  She unfortunately has gone back into atrial fibrillation.  She has symptoms of weakness and fatigue.  She has been persistent for the last few months.  Today, denies symptoms of palpitations, chest pain, shortness of breath, orthopnea, PND, lower extremity edema, claudication, dizziness, presyncope, syncope, bleeding, or neurologic sequela. The patient is tolerating medications without difficulties.    Past Medical History:  Diagnosis Date  . Bradycardia    a. initial PPM placed [facility] at [facility] with gen change 2002. b. 1*AVB & intermittent 2nd degree AVB + CHF --> upgrade to [facility].  Jude BiV PPM 05/08/12.  . Chronic systolic CHF (congestive heart failure) ([place])    a. EF 30-35% dx in 2011 -> most recent 40-45% by echo 04/2012. b. s/p upgrade to BiV-PPM 05/08/12. c. Med titration limited by hypotension.  . Cluster headache syndrome   . Hypertr obst cardiomyop   . Hypokalemia   . Hypotension   . Paroxysmal atrial fibrillation (HCC)    a. identified on PPM interrogation b. longest episode 1 hour; if burden increases, Eline Geng need [place]   . Pneumonia 2008  . Sleep apnea     Past Surgical History:  Procedure Laterality Date  . ATRIAL FIBRILLATION ABLATION N/A 11/15/2017   Procedure: ATRIAL FIBRILLATION ABLATION;  Surgeon: Constance Haw, MD;  Location: [place] [facility];  Service: Cardiovascular;  Laterality: N/A;  . BI-VENTRICULAR PACEMAKER UPGRADE N/A 05/08/2012   upgrade to SJM Anthem (CRT-P) by Dr Rayann Heman  . CARDIOVERSION N/A 09/19/2017   Procedure: CARDIOVERSION;  Surgeon: Sanda Klein, MD;  Location: [place] [facility];  Service: Cardiovascular;  Laterality: N/A;  . Hand; 2002; 05/08/2012  . TONSILLECTOMY  ~ 1959  . TUBAL LIGATION  1984?    Current Outpatient Medications  Medication Sig Dispense Refill  . allopurinol (ZYLOPRIM) 100 MG tablet TAKE 1/2 TABLET (50 MG TOTAL) BY MOUTH DAILY. 15 tablet 1  . ALPRAZolam (XANAX) 0.25 MG tablet TAKE 1 TABLET (0.25 MG TOTAL) BY MOUTH 2 (TWO) TIMES DAILY AS NEEDED FOR ANXIETY. 30 tablet 1  . AMBULATORY NON FORMULARY MEDICATION Medication Name: 100% O2 15 leters a min for 15-20 mins  Via Nasal cannual 1 Bottle 2  . amitriptyline (ELAVIL) 10  MG tablet Take 1 tablet (10 mg total) by mouth at bedtime. 30 tablet 3  . aspirin 81 MG chewable tablet Chew 81 mg by mouth daily.    . bisoprolol (ZEBETA) 5 MG tablet Take 1 tablet (5 mg total) by mouth daily. 90 tablet 3  . budesonide-formoterol (SYMBICORT) 160-4.5 MCG/ACT inhaler INHALE 2 PUFFS BY MOUTH INTO THE LUNGS 2 TIMES A DAY 10.2 g 2  . colchicine 0.6 MG tablet Take 1  tablet (0.6 mg total) by mouth 2 (two) times daily as needed (for gout flare ups.). 60 tablet 1  . cyclobenzaprine (FLEXERIL) 5 MG tablet TAKE 1 TABLET (5 MG TOTAL) BY MOUTH 3 (THREE) TIMES DAILY AS NEEDED FOR MUSCLE SPASMS. 30 tablet 2  . diclofenac sodium (VOLTAREN) 1 % GEL Apply 2 g topically 4 (four) times daily. 3 Tube 0  . ELIQUIS 5 MG TABS tablet TAKE 1 TABLET BY MOUTH TWICE DAILY 60 tablet 5  . FLUoxetine (PROZAC) 20 MG capsule TAKE 1 CAPSULE (20 MG TOTAL) BY MOUTH DAILY. 30 capsule 3  . fluticasone (FLONASE) 50 MCG/ACT nasal spray PLACE 2 SPRAYS INTO BOTH NOSTRILS DAILY. 16 g 5  . ipratropium-albuterol (DUONEB) 0.5-2.5 (3) MG/3ML SOLN USE 1 VIAL (3ML) BY NEBULIZER EVERY 4 TO 6 HOURS AS NEEDED 270 mL 1  . losartan (COZAAR) 25 MG tablet Take 1 tablet (25 mg total) by mouth daily. 30 tablet 11  . montelukast (SINGULAIR) 10 MG tablet TAKE 1 TABLET BY MOUTH AT BEDTIME 30 tablet 1  . potassium chloride SA (K-DUR,KLOR-CON) 20 MEQ tablet TAKE 1 TABLET (20 MEQ TOTAL) BY MOUTH DAILY. 90 tablet 1  . Spacer/Aero-Holding Chambers (AEROCHAMBER MV) inhaler Use as instructed with Symbicort 1 each 0  . SPIRIVA RESPIMAT 2.5 MCG/ACT AERS INHALE 2 PUFFS BY MOUTH INTO THE LUNGS ONCE DAILY 12 g 2  . SUMAtriptan (IMITREX) 50 MG tablet TAKE 1 TABLET AT ONSET OF HEADACHE. MAY REPEAT IN 2 HOURS IF HEADACHE PERSISTS OR RECURS. 10 tablet 6  . topiramate (TOPAMAX) 50 MG tablet Take 50 mg by mouth daily as needed (migraine headaches).     . torsemide (DEMADEX) 20 MG tablet Take 1 tablet (20 mg total) by mouth 2 (two) times daily. Needs appt for further refills 60 tablet 0  . VENTOLIN HFA 108 (90 Base) MCG/ACT inhaler INHALE 1-2 PUFFS INTO THE LUNGS EVERY 6 (SIX) HOURS AS NEEDED FOR WHEEZING OR SHORTNESS OF BREATH. 1 Inhaler 5   No current facility-administered medications for this visit.     Allergies:   Prednisone   Social History:  The patient  reports that she quit smoking about 2 months ago. Her smoking use  included cigarettes. She has a 15.00 pack-year smoking history. She has never used smokeless tobacco. She reports current alcohol use of about 2.0 standard drinks of alcohol per week. She reports that she does not use drugs.   Family History:  The patient's  family history includes Cancer (age of onset: 73) in her sister; Cardiomyopathy in her sister; Dementia in her mother; Diabetes in her brother and another family member; Heart disease in her sister.   ROS:  Please see the history of present illness.   All other systems are personally reviewed and negative.    Exam:    Vital Signs:  BP 110/72   Pulse 78   Ht 5\' 2"  (1.575 m)   Wt 115 lb (52.2 kg)   LMP  (LMP Unknown)   SpO2 93%   BMI 21.03 kg/m  Well appearing, alert and conversant, regular work of breathing,  good skin color Eyes- anicteric, neuro- grossly intact, skin- no apparent rash or lesions or cyanosis, mouth- oral mucosa is pink   Labs/Other Tests and Data Reviewed:    Recent Labs: 07/15/2018: ALT 16; BUN 18; Creat 1.04; Hemoglobin 15.0; Platelets 210; Potassium 3.9; Sodium 140; TSH 1.46   Wt Readings from Last 3 Encounters:  09/17/18 115 lb (52.2 kg)  07/15/18 114 lb 4 oz (51.8 kg)  07/03/18 114 lb (51.7 kg)     Other studies personally reviewed:  Last device remote is reviewed from [facility] PDF dated 08/22/18 which reveals normal device function, no arrhythmias Persistent AF, on [place], there is good ventricular rate control. Only CRT pacing at 81% of the time.   ASSESSMENT & PLAN:    1.  Persistent atrial fibrillation: Currently on Eliquis.  Is status post ablation 11/18/2017, but is unfortunately gone back into atrial fibrillation.  Due to that, we Isao Seltzer plan for cardioversion.  I Tequisha Maahs see her back in clinic post cardioversion for discussion of further therapies which may include repeat ablation.  This patients CHA2DS2-VASc Score and unadjusted Ischemic Stroke Rate (% per year) is equal to 3.2 % stroke rate/year  from a score of 3  Above score calculated as 1 point each if present [CHF, HTN, DM, Vascular=MI/PAD/Aortic Plaque, Age if 65-74, or Female] Above score calculated as 2 points each if present [Age > 75, or Stroke/TIA/TE]  2.  Hypertrophic cardiomyopathy: Status post Saint Jude CRT-P on optimal medical therapy.  COVID 19 screen The patient denies symptoms of COVID 19 at this time.  The importance of social distancing was discussed today.  Follow-up: 2 months  Current medicines are reviewed at length with the patient today.   The patient does not have concerns regarding her medicines.  The following changes were made today:  none  Labs/ tests ordered today include:  No orders of the defined types were placed in this encounter.    Patient Risk:  after full review of this patients clinical status, I feel that they are at moderate risk at this time.  Today, I have spent 8 minutes with the patient with telehealth technology discussing atrial fibrillation.    Signed, Adrion Menz Meredith Leeds, MD  09/17/2018 3:36 PM     [address] [place]  [address] [phone] (office) [phone] (fax)

## 2018-09-17 NOTE — Patient Instructions (Addendum)
Medication Instructions:  Your physician recommends that you continue on your current medications as directed. Please refer to the Current Medication list given to you today.  * If you need a refill on your cardiac medications before your next appointment, please call your pharmacy.   Labwork: Pre procedure labs on ________ : BMET & CBC If you have labs (blood work) drawn today and your tests are completely normal, you will receive your results only by:  MyChart Message (if you have MyChart) OR  A paper copy in the mail If you have any lab test that is abnormal or we need to change your treatment, we will call you to review the results.  Testing/Procedures: Your physician has recommended that you have a Cardioversion (DCCV). Electrical Cardioversion uses a jolt of electricity to your heart either through paddles or wired patches attached to your chest. This is a controlled, usually prescheduled, procedure. Defibrillation is done under light anesthesia in the hospital, and you usually go home the day of the procedure. This is done to get your heart back into a normal rhythm. You are not awake for the procedure.   CARDIOVERSION INSTRUCTIONS  You are scheduled for a cardioversion on ________  with Dr. _________ or associates. Please go to [facility] at _________. , Enter through the [facility] not have any food or drink after midnight on _________.  You may take your medicines with a sip of water on the day of your procedure.  You will need someone to drive you home following your procedure.  Call the [place] office at [phone] if you have any questions, problems or concerns.   Follow-Up: Your physician recommends that you schedule a follow-up appointment in: 6 weeks with Dr. Curt Bears.   Thank you for choosing [facility]!!   Trinidad Curet, RN [phone]  Any Other Special Instructions Will Be Listed Below (If Applicable).    Electrical Cardioversion  Electrical cardioversion is the delivery of a jolt of electricity to restore a normal rhythm to the heart. A rhythm that is too fast or is not regular keeps the heart from pumping well. In this procedure, sticky patches or metal paddles are placed on the chest to deliver electricity to the heart from a device. This procedure may be done in an emergency if:  There is low or no blood pressure as a result of the heart rhythm.  Normal rhythm must be restored as fast as possible to protect the brain and heart from further damage.  It may save a life. This procedure may also be done for irregular or fast heart rhythms that are not immediately life-threatening. Tell a health care provider about:  Any allergies you have.  All medicines you are taking, including vitamins, herbs, eye drops, creams, and over-the-counter medicines.  Any problems you or family members have had with anesthetic medicines.  Any blood disorders you have.  Any surgeries you have had.  Any medical conditions you have.  Whether you are pregnant or may be pregnant. What are the risks? Generally, this is a safe procedure. However, problems may occur, including:  Allergic reactions to medicines.  A blood clot that breaks free and travels to other parts of your body.  The possible return of an abnormal heart rhythm within hours or days after the procedure.  Your heart stopping (cardiac arrest). This is rare. What happens before the procedure? Medicines  Your health care provider may have you start  taking: ? Blood-thinning medicines (anticoagulants) so your blood does not clot as easily. ? Medicines may be given to help stabilize your heart rate and rhythm.  Ask your health care provider about changing or stopping your regular medicines. This is especially important if you are taking diabetes medicines or blood thinners. General instructions  Plan to have someone take you home from  the hospital or clinic.  If you will be going home right after the procedure, plan to have someone with you for 24 hours.  Follow instructions from your health care provider about eating or drinking restrictions. What happens during the procedure?  To lower your risk of infection: ? Your health care team will wash or sanitize their hands. ? Your skin will be washed with soap.  An IV tube will be inserted into one of your veins.  You will be given a medicine to help you relax (sedative).  Sticky patches (electrodes) or metal paddles may be placed on your chest.  An electrical shock will be delivered. The procedure may vary among health care providers and hospitals. What happens after the procedure?   Your blood pressure, heart rate, breathing rate, and blood oxygen level will be monitored until the medicines you were given have worn off.  Do not drive for 24 hours if you were given a sedative.  Your heart rhythm will be watched to make sure it does not change. This information is not intended to replace advice given to you by your health care provider. Make sure you discuss any questions you have with your health care provider. Document Released: 12/15/2001 Document Revised: 12/07/2016 Document Reviewed: 07/01/2015 Elsevier Patient Education  2020 [employer].

## 2018-09-17 NOTE — Progress Notes (Signed)
Electrophysiology TeleHealth Note   Due to national recommendations of social distancing due to COVID 19, an audio/video telehealth visit is felt to be most appropriate for this patient at this time.  See Epic message for the patient's consent to telehealth for [facility].   Date:  09/17/2018   ID:  Angela Shepherd, DOB [birth year], MRN [id number]  Location: patient's home  Provider location: [address], [place]  Evaluation Performed: Follow-up visit  PCP:  Midge Minium, MD  Cardiologist: Bensimhon Electrophysiologist:  Dr Curt Bears  Chief Complaint: Atrial fibrillation  History of Present Illness:    Angela Shepherd is a 68 y.o. female who presents via audio/video conferencing for a telehealth visit today.  Since last being seen in our clinic, the patient reports doing very well.  Today, she denies symptoms of palpitations, chest pain, shortness of breath,  lower extremity edema, dizziness, presyncope, or syncope.  The patient is otherwise without complaint today.  The patient denies symptoms of fevers, chills, cough, or new SOB worrisome for COVID 19.  He has a history of hypertrophic cardiomyopathy, bradycardia status post pacemaker in 1990, hypertension, tobacco abuse, upgrade of pacemaker to CRT-P in 2014.  She also has atrial fibrillation and has ablation 11/18/2017.  She unfortunately has gone back into atrial fibrillation.  She has symptoms of weakness and fatigue.  She has been persistent for the last few months.  Today, denies symptoms of palpitations, chest pain, shortness of breath, orthopnea, PND, lower extremity edema, claudication, dizziness, presyncope, syncope, bleeding, or neurologic sequela. The patient is tolerating medications without difficulties.    Past Medical History:  Diagnosis Date  . Bradycardia    a. initial PPM placed 1994 at [facility] with gen change 2002. b. 1*AVB & intermittent 2nd degree AVB + CHF --> upgrade to [facility].  Jude BiV PPM 05/08/12.  . Chronic systolic CHF (congestive heart failure) ([place])    a. EF 30-35% dx in 2011 -> most recent 40-45% by echo 04/2012. b. s/p upgrade to BiV-PPM 05/08/12. c. Med titration limited by hypotension.  . Cluster headache syndrome   . Hypertr obst cardiomyop   . Hypokalemia   . Hypotension   . Paroxysmal atrial fibrillation (HCC)    a. identified on PPM interrogation b. longest episode 1 hour; if burden increases, Kathyann Spaugh need [place]   . Pneumonia 2008  . Sleep apnea     Past Surgical History:  Procedure Laterality Date  . ATRIAL FIBRILLATION ABLATION N/A 11/15/2017   Procedure: ATRIAL FIBRILLATION ABLATION;  Surgeon: Constance Haw, MD;  Location: [place] [facility] LAB;  Service: Cardiovascular;  Laterality: N/A;  . BI-VENTRICULAR PACEMAKER UPGRADE N/A 05/08/2012   upgrade to SJM Anthem (CRT-P) by Dr Rayann Heman  . CARDIOVERSION N/A 09/19/2017   Procedure: CARDIOVERSION;  Surgeon: Sanda Klein, MD;  Location: [facility] [facility];  Service: Cardiovascular;  Laterality: N/A;  . [place]; 2002; 05/08/2012  . TONSILLECTOMY  ~ 1959  . TUBAL LIGATION  1984?    Current Outpatient Medications  Medication Sig Dispense Refill  . allopurinol (ZYLOPRIM) 100 MG tablet TAKE 1/2 TABLET (50 MG TOTAL) BY MOUTH DAILY. 15 tablet 1  . ALPRAZolam (XANAX) 0.25 MG tablet TAKE 1 TABLET (0.25 MG TOTAL) BY MOUTH 2 (TWO) TIMES DAILY AS NEEDED FOR ANXIETY. 30 tablet 1  . AMBULATORY NON FORMULARY MEDICATION Medication Name: 100% O2 15 leters a min for 15-20 mins  Via Nasal cannual 1 Bottle 2  . amitriptyline (ELAVIL) 10  MG tablet Take 1 tablet (10 mg total) by mouth at bedtime. 30 tablet 3  . aspirin 81 MG chewable tablet Chew 81 mg by mouth daily.    . bisoprolol (ZEBETA) 5 MG tablet Take 1 tablet (5 mg total) by mouth daily. 90 tablet 3  . budesonide-formoterol (SYMBICORT) 160-4.5 MCG/ACT inhaler INHALE 2 PUFFS BY MOUTH INTO THE LUNGS 2 TIMES A DAY 10.2 g 2  . colchicine 0.6 MG tablet Take 1  tablet (0.6 mg total) by mouth 2 (two) times daily as needed (for gout flare ups.). 60 tablet 1  . cyclobenzaprine (FLEXERIL) 5 MG tablet TAKE 1 TABLET (5 MG TOTAL) BY MOUTH 3 (THREE) TIMES DAILY AS NEEDED FOR MUSCLE SPASMS. 30 tablet 2  . diclofenac sodium (VOLTAREN) 1 % GEL Apply 2 g topically 4 (four) times daily. 3 Tube 0  . ELIQUIS 5 MG TABS tablet TAKE 1 TABLET BY MOUTH TWICE DAILY 60 tablet 5  . FLUoxetine (PROZAC) 20 MG capsule TAKE 1 CAPSULE (20 MG TOTAL) BY MOUTH DAILY. 30 capsule 3  . fluticasone (FLONASE) 50 MCG/ACT nasal spray PLACE 2 SPRAYS INTO BOTH NOSTRILS DAILY. 16 g 5  . ipratropium-albuterol (DUONEB) 0.5-2.5 (3) MG/3ML SOLN USE 1 VIAL (3ML) BY NEBULIZER EVERY 4 TO 6 HOURS AS NEEDED 270 mL 1  . losartan (COZAAR) 25 MG tablet Take 1 tablet (25 mg total) by mouth daily. 30 tablet 11  . montelukast (SINGULAIR) 10 MG tablet TAKE 1 TABLET BY MOUTH AT BEDTIME 30 tablet 1  . potassium chloride SA (K-DUR,KLOR-CON) 20 MEQ tablet TAKE 1 TABLET (20 MEQ TOTAL) BY MOUTH DAILY. 90 tablet 1  . Spacer/Aero-Holding Chambers (AEROCHAMBER MV) inhaler Use as instructed with Symbicort 1 each 0  . SPIRIVA RESPIMAT 2.5 MCG/ACT AERS INHALE 2 PUFFS BY MOUTH INTO THE LUNGS ONCE DAILY 12 g 2  . SUMAtriptan (IMITREX) 50 MG tablet TAKE 1 TABLET AT ONSET OF HEADACHE. MAY REPEAT IN 2 HOURS IF HEADACHE PERSISTS OR RECURS. 10 tablet 6  . topiramate (TOPAMAX) 50 MG tablet Take 50 mg by mouth daily as needed (migraine headaches).     . torsemide (DEMADEX) 20 MG tablet Take 1 tablet (20 mg total) by mouth 2 (two) times daily. Needs appt for further refills 60 tablet 0  . VENTOLIN HFA 108 (90 Base) MCG/ACT inhaler INHALE 1-2 PUFFS INTO THE LUNGS EVERY 6 (SIX) HOURS AS NEEDED FOR WHEEZING OR SHORTNESS OF BREATH. 1 Inhaler 5   No current facility-administered medications for this visit.     Allergies:   Prednisone   Social History:  The patient  reports that she quit smoking about 2 months ago. Her smoking use  included cigarettes. She has a 15.00 pack-year smoking history. She has never used smokeless tobacco. She reports current alcohol use of about 2.0 standard drinks of alcohol per week. She reports that she does not use drugs.   Family History:  The patient's  family history includes Cancer (age of onset: 79) in her sister; Cardiomyopathy in her sister; Dementia in her mother; Diabetes in her brother and another family member; Heart disease in her sister.   ROS:  Please see the history of present illness.   All other systems are personally reviewed and negative.    Exam:    Vital Signs:  BP 110/72   Pulse 78   Ht 5\' 2"  (1.575 m)   Wt 115 lb (52.2 kg)   LMP  (LMP Unknown)   SpO2 93%   BMI 21.03 kg/m  Well appearing, alert and conversant, regular work of breathing,  good skin color Eyes- anicteric, neuro- grossly intact, skin- no apparent rash or lesions or cyanosis, mouth- oral mucosa is pink   Labs/Other Tests and Data Reviewed:    Recent Labs: 07/15/2018: ALT 16; BUN 18; Creat 1.04; Hemoglobin 15.0; Platelets 210; Potassium 3.9; Sodium 140; TSH 1.46   Wt Readings from Last 3 Encounters:  09/17/18 115 lb (52.2 kg)  07/15/18 114 lb 4 oz (51.8 kg)  07/03/18 114 lb (51.7 kg)     Other studies personally reviewed:  Last device remote is reviewed from [facility] PDF dated 08/22/18 which reveals normal device function, no arrhythmias Persistent AF, on [place], there is good ventricular rate control. Only CRT pacing at 81% of the time.   ASSESSMENT & PLAN:    1.  Persistent atrial fibrillation: Currently on Eliquis.  Is status post ablation 11/18/2017, but is unfortunately gone back into atrial fibrillation.  Due to that, we Jaquala Fuller plan for cardioversion.  I Kriston Mckinnie see her back in clinic post cardioversion for discussion of further therapies which may include repeat ablation.  This patients CHA2DS2-VASc Score and unadjusted Ischemic Stroke Rate (% per year) is equal to 3.2 % stroke rate/year  from a score of 3  Above score calculated as 1 point each if present [CHF, HTN, DM, Vascular=MI/PAD/Aortic Plaque, Age if 65-74, or Female] Above score calculated as 2 points each if present [Age > 75, or Stroke/TIA/TE]  2.  Hypertrophic cardiomyopathy: Status post Saint Jude CRT-P on optimal medical therapy.  COVID 19 screen The patient denies symptoms of COVID 19 at this time.  The importance of social distancing was discussed today.  Follow-up: 2 months  Current medicines are reviewed at length with the patient today.   The patient does not have concerns regarding her medicines.  The following changes were made today:  none  Labs/ tests ordered today include:  No orders of the defined types were placed in this encounter.    Patient Risk:  after full review of this patients clinical status, I feel that they are at moderate risk at this time.  Today, I have spent 8 minutes with the patient with telehealth technology discussing atrial fibrillation.    Signed, Marian Meneely Meredith Leeds, MD  09/17/2018 3:36 PM     [place] [address] [phone] (office) [phone] (fax)

## 2018-09-24 ENCOUNTER — Telehealth: Payer: Self-pay | Admitting: *Deleted

## 2018-09-24 NOTE — Telephone Encounter (Signed)
Scheduled DCCV 9/23.  Instructions sent via Mychart per pt request. covid screening scheduled for 9/19.

## 2018-09-25 ENCOUNTER — Encounter: Payer: 59 | Admitting: Cardiology

## 2018-09-25 DIAGNOSIS — G4733 Obstructive sleep apnea (adult) (pediatric): Secondary | ICD-10-CM | POA: Diagnosis not present

## 2018-09-27 ENCOUNTER — Other Ambulatory Visit (HOSPITAL_COMMUNITY)
Admission: RE | Admit: 2018-09-27 | Discharge: 2018-09-27 | Disposition: A | Discharge status: Home / Self Care | Payer: 59 | Source: Ambulatory Visit | Attending: Cardiology | Admitting: Cardiology

## 2018-09-27 DIAGNOSIS — Z01812 Encounter for preprocedural laboratory examination: Secondary | ICD-10-CM | POA: Diagnosis not present

## 2018-09-27 DIAGNOSIS — Z20828 Contact with and (suspected) exposure to other viral communicable diseases: Secondary | ICD-10-CM | POA: Diagnosis not present

## 2018-09-28 LAB — NOVEL CORONAVIRUS, NAA (HOSP ORDER, SEND-OUT TO REF LAB; TAT 18-24 HRS): SARS-CoV-2, NAA: NOT DETECTED

## 2018-10-01 ENCOUNTER — Encounter (HOSPITAL_COMMUNITY): Payer: Self-pay | Admitting: *Deleted

## 2018-10-01 ENCOUNTER — Ambulatory Visit (HOSPITAL_COMMUNITY): Payer: 59 | Admitting: Anesthesiology

## 2018-10-01 ENCOUNTER — Other Ambulatory Visit: Payer: Self-pay

## 2018-10-01 ENCOUNTER — Ambulatory Visit (HOSPITAL_COMMUNITY)
Admission: RE | Admit: 2018-10-01 | Discharge: 2018-10-01 | Disposition: A | Discharge status: Home / Self Care | Payer: 59 | Attending: Cardiology | Admitting: Cardiology

## 2018-10-01 ENCOUNTER — Encounter (HOSPITAL_COMMUNITY)
Admission: RE | Disposition: A | Discharge status: Home / Self Care | Payer: 59 | Source: Home / Self Care | Attending: Cardiology

## 2018-10-01 DIAGNOSIS — Z7982 Long term (current) use of aspirin: Secondary | ICD-10-CM | POA: Diagnosis not present

## 2018-10-01 DIAGNOSIS — Z79899 Other long term (current) drug therapy: Secondary | ICD-10-CM | POA: Diagnosis not present

## 2018-10-01 DIAGNOSIS — Z7901 Long term (current) use of anticoagulants: Secondary | ICD-10-CM | POA: Diagnosis not present

## 2018-10-01 DIAGNOSIS — I422 Other hypertrophic cardiomyopathy: Secondary | ICD-10-CM | POA: Insufficient documentation

## 2018-10-01 DIAGNOSIS — I4819 Other persistent atrial fibrillation: Secondary | ICD-10-CM | POA: Insufficient documentation

## 2018-10-01 DIAGNOSIS — Z87891 Personal history of nicotine dependence: Secondary | ICD-10-CM | POA: Insufficient documentation

## 2018-10-01 DIAGNOSIS — I130 Hypertensive heart and chronic kidney disease with heart failure and stage 1 through stage 4 chronic kidney disease, or unspecified chronic kidney disease: Secondary | ICD-10-CM | POA: Diagnosis not present

## 2018-10-01 DIAGNOSIS — Z7951 Long term (current) use of inhaled steroids: Secondary | ICD-10-CM | POA: Diagnosis not present

## 2018-10-01 DIAGNOSIS — I110 Hypertensive heart disease with heart failure: Secondary | ICD-10-CM | POA: Diagnosis not present

## 2018-10-01 DIAGNOSIS — G4730 Sleep apnea, unspecified: Secondary | ICD-10-CM | POA: Diagnosis not present

## 2018-10-01 DIAGNOSIS — I4891 Unspecified atrial fibrillation: Secondary | ICD-10-CM | POA: Diagnosis not present

## 2018-10-01 DIAGNOSIS — I5022 Chronic systolic (congestive) heart failure: Secondary | ICD-10-CM | POA: Insufficient documentation

## 2018-10-01 DIAGNOSIS — I5042 Chronic combined systolic (congestive) and diastolic (congestive) heart failure: Secondary | ICD-10-CM | POA: Diagnosis not present

## 2018-10-01 DIAGNOSIS — N182 Chronic kidney disease, stage 2 (mild): Secondary | ICD-10-CM | POA: Diagnosis not present

## 2018-10-01 DIAGNOSIS — Z888 Allergy status to other drugs, medicaments and biological substances status: Secondary | ICD-10-CM | POA: Insufficient documentation

## 2018-10-01 HISTORY — PX: CARDIOVERSION: SHX1299

## 2018-10-01 LAB — BASIC METABOLIC PANEL
Anion gap: 12 (ref 5–15)
BUN: 27 mg/dL — ABNORMAL HIGH (ref 8–23)
CO2: 26 mmol/L (ref 22–32)
Calcium: 9.2 mg/dL (ref 8.9–10.3)
Chloride: 102 mmol/L (ref 98–111)
Creatinine, Ser: 1.22 mg/dL — ABNORMAL HIGH (ref 0.44–1.00)
GFR calc Af Amer: 53 mL/min — ABNORMAL LOW (ref >60)
GFR calc non Af Amer: 46 mL/min — ABNORMAL LOW (ref >60)
Glucose, Bld: 109 mg/dL — ABNORMAL HIGH (ref 70–99)
Potassium: 3.9 mmol/L (ref 3.5–5.1)
Sodium: 140 mmol/L (ref 135–145)

## 2018-10-01 LAB — CBC WITH DIFFERENTIAL/PLATELET
Abs Immature Granulocytes: 0.01 10*3/uL (ref 0.00–0.07)
Basophils Absolute: 0.1 10*3/uL (ref 0.0–0.1)
Basophils Relative: 1 %
Eosinophils Absolute: 0.1 10*3/uL (ref 0.0–0.5)
Eosinophils Relative: 2 %
HCT: 45.8 % (ref 36.0–46.0)
Hemoglobin: 15.4 g/dL — ABNORMAL HIGH (ref 12.0–15.0)
Immature Granulocytes: 0 %
Lymphocytes Relative: 17 %
Lymphs Abs: 0.9 10*3/uL (ref 0.7–4.0)
MCH: 33.6 pg (ref 26.0–34.0)
MCHC: 33.6 g/dL (ref 30.0–36.0)
MCV: 100 fL (ref 80.0–100.0)
Monocytes Absolute: 0.4 10*3/uL (ref 0.1–1.0)
Monocytes Relative: 9 %
Neutro Abs: 3.7 10*3/uL (ref 1.7–7.7)
Neutrophils Relative %: 71 %
Platelets: 202 10*3/uL (ref 150–400)
RBC: 4.58 MIL/uL (ref 3.87–5.11)
RDW: 15 % (ref 11.5–15.5)
WBC: 5.1 10*3/uL (ref 4.0–10.5)
nRBC: 0 % (ref 0.0–0.2)

## 2018-10-01 SURGERY — CARDIOVERSION
Anesthesia: General

## 2018-10-01 MED ORDER — PROPOFOL 10 MG/ML IV BOLUS
INTRAVENOUS | Status: DC | PRN
  Administered 2018-10-01: 60 mg via INTRAVENOUS

## 2018-10-01 MED ORDER — SODIUM CHLORIDE 0.9 % IV SOLN
INTRAVENOUS | Status: DC
  Administered 2018-10-01: 09:00:00 via INTRAVENOUS

## 2018-10-01 MED ORDER — APIXABAN 5 MG PO TABS
5.0000 mg | ORAL_TABLET | Freq: Once | ORAL | Status: AC
  Administered 2018-10-01: 5 mg via ORAL
  Filled 2018-10-01 (×2): qty 1

## 2018-10-01 MED ORDER — LIDOCAINE 2% (20 MG/ML) 5 ML SYRINGE
INTRAMUSCULAR | Status: DC | PRN
  Administered 2018-10-01: 50 mg via INTRAVENOUS

## 2018-10-01 NOTE — CV Procedure (Signed)
Procedure:   DCCV  Indication:  Symptomatic atrial fibrillation  Procedure Note:  The patient signed informed consent.  They have had had therapeutic anticoagulation with Eliquis greater than 3 weeks.  Anesthesia was administered by Dr. Royce Macadamia.  Adequate airway was maintained throughout and vital followed per protocol.  They were cardioverted x 1 with 200J of biphasic synchronized energy.  They converted to NSR.  There were no apparent complications.  The patient had normal neuro status and respiratory status post procedure with vitals stable as recorded elsewhere.    Follow up:  They will continue on current medical therapy and follow up with cardiology as scheduled.  Oswaldo Milian, MD 10/01/2018 10:24 AM

## 2018-10-01 NOTE — Anesthesia Procedure Notes (Addendum)
Procedure Name: General with mask airway Date/Time: 10/01/2018 10:11 AM Performed by: Elayne Snare, CRNA Pre-anesthesia Checklist: Patient identified, Emergency Drugs available, Suction available and Patient being monitored Patient Re-evaluated:Patient Re-evaluated prior to induction Oxygen Delivery Method: Ambu bag Preoxygenation: Pre-oxygenation with 100% oxygen

## 2018-10-01 NOTE — Discharge Instructions (Signed)

## 2018-10-01 NOTE — Anesthesia Postprocedure Evaluation (Signed)
Anesthesia Post Note  Patient: Angela Shepherd  Procedure(s) Performed: CARDIOVERSION (N/A )     Patient location during evaluation: PACU Anesthesia Type: General Level of consciousness: awake and alert Pain management: pain level controlled Vital Signs Assessment: post-procedure vital signs reviewed and stable Respiratory status: spontaneous breathing, nonlabored ventilation and respiratory function stable Cardiovascular status: blood pressure returned to baseline and stable Postop Assessment: no apparent nausea or vomiting Anesthetic complications: no    Last Vitals:  Vitals:   10/01/18 1030 10/01/18 1040  BP: 99/65 99/68  Pulse: 71 70  Resp: 20 19  Temp:    SpO2: 95% 97%    Last Pain:  Vitals:   10/01/18 1040  TempSrc:   PainSc: 0-No pain                 Diani Jillson A.

## 2018-10-01 NOTE — Anesthesia Preprocedure Evaluation (Signed)
Anesthesia Evaluation  Patient identified by MRN, date of birth, ID band Patient awake    Reviewed: Allergy & Precautions, NPO status , reviewed documented beta blocker date and time   Airway Mallampati: II  TM Distance: >3 FB Neck ROM: Full    Dental no notable dental hx. (+) Teeth Intact, Caps   Pulmonary sleep apnea and Continuous Positive Airway Pressure Ventilation , pneumonia, resolved, COPD,  COPD inhaler, former smoker,    Pulmonary exam normal breath sounds clear to auscultation       Cardiovascular hypertension, Pt. on medications and Pt. on home beta blockers +CHF   Rhythm:Regular Rate:Normal  HOCM Biventricular PPM   Neuro/Psych  Headaches, PSYCHIATRIC DISORDERS Anxiety Depression Cluster HA    GI/Hepatic   Endo/Other  Hyperlipidemia  Renal/GU Renal InsufficiencyRenal disease  negative genitourinary   Musculoskeletal  (+) Arthritis , Gout   Abdominal   Peds  Hematology Eliduis - last dose 09/30/2018   Anesthesia Other Findings   Reproductive/Obstetrics                             Anesthesia Physical Anesthesia Plan  ASA: III  Anesthesia Plan: General   Post-op Pain Management:    Induction: Intravenous  PONV Risk Score and Plan: 3 and Ondansetron and Treatment may vary due to age or medical condition  Airway Management Planned: Mask and Natural Airway  Additional Equipment:   Intra-op Plan:   Post-operative Plan:   Informed Consent: I have reviewed the patients History and Physical, chart, labs and discussed the procedure including the risks, benefits and alternatives for the proposed anesthesia with the patient or authorized representative who has indicated his/her understanding and acceptance.     Dental advisory given  Plan Discussed with: CRNA and Surgeon  Anesthesia Plan Comments:         Anesthesia Quick Evaluation

## 2018-10-01 NOTE — Interval H&P Note (Signed)
History and Physical Interval Note:  10/01/2018 10:07 AM  Angela Shepherd  has presented today for surgery, with the diagnosis of AFIB.  The various methods of treatment have been discussed with the patient and family. After consideration of risks, benefits and other options for treatment, the patient has consented to  Procedure(s): CARDIOVERSION (N/A) as a surgical intervention.  The patient's history has been reviewed, patient examined, no change in status, stable for surgery.  I have reviewed the patient's chart and labs.  Questions were answered to the patient's satisfaction.     Donato Heinz

## 2018-10-01 NOTE — Transfer of Care (Signed)
Immediate Anesthesia Transfer of Care Note  Patient: Angela Shepherd  Procedure(s) Performed: CARDIOVERSION (N/A )  Patient Location: Endoscopy Unit  Anesthesia Type:General  Level of Consciousness: drowsy and responds to stimulation  Airway & Oxygen Therapy: Patient Spontanous Breathing  Post-op Assessment: Report given to RN and Post -op Vital signs reviewed and stable  Post vital signs: Reviewed and stable  Last Vitals:  Vitals Value Taken Time  BP    Temp    Pulse    Resp    SpO2      Last Pain:  Vitals:   10/01/18 0856  PainSc: 0-No pain         Complications: No apparent anesthesia complications

## 2018-10-03 MED FILL — SPIRIVA RESPIMAT 2.5 MCG IN: 2.5 | 30 days supply | Qty: 4 | Fill #0

## 2018-10-03 MED FILL — ALPRAZolam 0.25 MG TABS: 0.25 | 15 days supply | Qty: 30 | Fill #1

## 2018-10-07 ENCOUNTER — Other Ambulatory Visit (HOSPITAL_COMMUNITY): Payer: Self-pay

## 2018-10-07 MED ORDER — TORSEMIDE 20 MG PO TABS: 20.0000 mg | ORAL_TABLET | Freq: Two times a day (BID) | ORAL | 0 refills | Status: DC

## 2018-10-07 MED FILL — TORSEMIDE 20 MG TABLET: 20 | 30 days supply | Qty: 60 | Fill #0

## 2018-10-07 MED FILL — FLUoxetine HCL 20 MG CAPS: 20 | 30 days supply | Qty: 30 | Fill #2

## 2018-10-07 NOTE — Telephone Encounter (Signed)
VS can we prescribe nicotine patches?

## 2018-10-07 NOTE — Telephone Encounter (Signed)
Okay to send script for nicotine patch. °

## 2018-10-08 MED ORDER — NICOTINE 21 MG/24HR TD PT24: 21.0000 mg | MEDICATED_PATCH | Freq: Every day | TRANSDERMAL | 0 refills | Status: DC

## 2018-10-08 MED FILL — NICOTINE 21 MG/24HR PATCH: 21 | 28 days supply | Qty: 28 | Fill #0

## 2018-10-13 ENCOUNTER — Other Ambulatory Visit: Payer: Self-pay | Admitting: Emergency Medicine

## 2018-10-13 ENCOUNTER — Telehealth: Payer: Self-pay | Admitting: *Deleted

## 2018-10-13 MED ORDER — MONTELUKAST SODIUM 10 MG PO TABS: 10.0000 mg | ORAL_TABLET | Freq: Every day | ORAL | 5 refills | Status: DC

## 2018-10-13 MED FILL — MONTELUKAST SOD 10 MG TAB: 10 | 30 days supply | Qty: 30 | Fill #0

## 2018-10-13 NOTE — Telephone Encounter (Signed)
[facility] alert received 10/10/18 at 10:09 for AT/AF episode and HVRs during AT/AF. Pt back in AF since 10/10/18, s/p DCCV on 10/01/18.

## 2018-10-13 NOTE — Addendum Note (Signed)
Addended by: Desmond Dike C on: 10/13/2018 10:41 AM   Modules accepted: Orders

## 2018-10-14 NOTE — Telephone Encounter (Signed)
Pt informed that Dr. Curt Bears recommends repeat ablation or starting Tikosyn. Will send information on Tikosyn via MyChart for pt to review.  She will send MyChart message once she has decided.  Pt advised to stop Bisoprolol per Dr. Curt Bears. She will call the office if issues arise w/ HRs and/or other SE from AFib.  Patient verbalized understanding and agreeable to plan.

## 2018-10-14 NOTE — Telephone Encounter (Signed)
Pt unaware she has been back in AFib since 10/2. States her issues come and go.  She reports that she felt terrible for several days post DCCV - reports experiencing terrible SOB after. She has felt better the past few days.  Yesterday and today she feels "almost normal again". Reports BPs have been very low, SBP avg in the 80s.  She hasn't taken her Losartan in 2 weeks d/t this.  Highest her pressure has been in the last several weeks is 100/71. She is still taking her Bisoprolol.  Pt aware Dr. Curt Bears will review and I will call her soon with advisement. Patient verbalized understanding and agreeable to plan.

## 2018-10-17 MED FILL — SYMBICORT 160-4.5 MCG INH: 160-4.5 | 30 days supply | Qty: 10 | Fill #1

## 2018-10-25 DIAGNOSIS — G4733 Obstructive sleep apnea (adult) (pediatric): Secondary | ICD-10-CM | POA: Diagnosis not present

## 2018-10-27 ENCOUNTER — Other Ambulatory Visit: Payer: Self-pay | Admitting: Family Medicine

## 2018-10-27 MED FILL — ALPRAZolam 0.25 MG TABS: 0.25 | 15 days supply | Qty: 30 | Fill #0

## 2018-10-27 NOTE — Telephone Encounter (Signed)
Last OV 07/15/18 Alprazolam last filled 09/08/18 #30 with 1

## 2018-10-28 MED FILL — SPIRIVA RESPIMAT 2.5 MCG IN: 2.5 | 30 days supply | Qty: 4 | Fill #1

## 2018-10-29 ENCOUNTER — Telehealth (HOSPITAL_COMMUNITY): Payer: Self-pay

## 2018-10-29 NOTE — Telephone Encounter (Signed)
Called pt to determine if FMLA paper work from [employer] is for upcoming [facility] admission or if it is just renewal for CHF. Left message for pt to call back.

## 2018-10-31 MED ORDER — DOFETILIDE 500 MCG PO CAPS: 500.0000 ug | ORAL_CAPSULE | Freq: Two times a day (BID) | ORAL | 0 refills | Status: DC

## 2018-10-31 NOTE — Addendum Note (Signed)
Addended by: Stanton Kidney on: 10/31/2018 04:45 PM   Modules accepted: Orders

## 2018-10-31 NOTE — Telephone Encounter (Signed)
Pt agreeable to starting Tikosyn. Sent in Vrishank Moster quote Rx to pharmacy.  Pt will send me Mychart message letting us know if affordable.

## 2018-11-04 ENCOUNTER — Other Ambulatory Visit (HOSPITAL_COMMUNITY): Payer: Self-pay | Admitting: Internal Medicine

## 2018-11-04 MED FILL — FLUoxetine HCL 20 MG CAPS: 20 | 30 days supply | Qty: 30 | Fill #3

## 2018-11-04 MED FILL — TORSEMIDE 20 MG TABLET: 20 | 30 days supply | Qty: 60 | Fill #0

## 2018-11-07 ENCOUNTER — Telehealth: Payer: Self-pay | Admitting: Pharmacist

## 2018-11-07 NOTE — Telephone Encounter (Signed)
Medication list reviewed in anticipation of upcoming Tikosyn initiation. Patient is on fluoxetine and amitriptyline which are intermediate risk QT prolonging medications. Patient should be monitored closely when using these medications with Tikosyn.  Patient is anticoagulated on Eliquis on the appropriate dose. Please ensure that patient has not missed any anticoagulation doses in the 3 weeks prior to Tikosyn initiation.   Patient will need to be counseled to avoid use of Benadryl while on Tikosyn and in the 2-3 days prior to Tikosyn initiation.  Please ensure that patients K is 4 or greater and Mg is 2 or greater prior to starting Tikosyn especially since patient is on torsemide.

## 2018-11-10 ENCOUNTER — Encounter (HOSPITAL_COMMUNITY): Payer: Self-pay

## 2018-11-10 NOTE — Telephone Encounter (Signed)
Per Dr. Curt Bears prefers pt change prozac to different agent prior to admission for tikosyn. Message sent to patient to talk with PCP regarding switch then will have her admission set up.

## 2018-11-11 ENCOUNTER — Encounter: Payer: Self-pay | Admitting: Physician Assistant

## 2018-11-11 NOTE — Telephone Encounter (Signed)
Pt has decided she would prefer to stay on prozac and not proceed with tikosyn admit instead prefers repeat ablation. Mychart message from patient has been sent to [facility] RN to further discuss repeat ablation option instead.

## 2018-11-12 MED FILL — MONTELUKAST SOD 10 MG TAB: 10 | 90 days supply | Qty: 90 | Fill #1

## 2018-11-13 ENCOUNTER — Other Ambulatory Visit: Payer: Self-pay

## 2018-11-13 ENCOUNTER — Ambulatory Visit (HOSPITAL_BASED_OUTPATIENT_CLINIC_OR_DEPARTMENT_OTHER)
Admission: RE | Admit: 2018-11-13 | Discharge: 2018-11-13 | Disposition: A | Discharge status: Home / Self Care | Payer: 59 | Source: Ambulatory Visit | Attending: Adult Health | Admitting: Adult Health

## 2018-11-13 DIAGNOSIS — R918 Other nonspecific abnormal finding of lung field: Secondary | ICD-10-CM | POA: Diagnosis not present

## 2018-11-13 DIAGNOSIS — R911 Solitary pulmonary nodule: Secondary | ICD-10-CM | POA: Diagnosis not present

## 2018-11-17 ENCOUNTER — Encounter: Payer: Self-pay | Admitting: Pulmonary Disease

## 2018-11-17 ENCOUNTER — Ambulatory Visit (INDEPENDENT_AMBULATORY_CARE_PROVIDER_SITE_OTHER): Payer: 59 | Admitting: Pulmonary Disease

## 2018-11-17 ENCOUNTER — Other Ambulatory Visit: Payer: Self-pay

## 2018-11-17 VITALS — BP 109/72 | HR 89 | Temp 97.2°F | Ht 62.0 in | Wt 120.2 lb

## 2018-11-17 DIAGNOSIS — G4733 Obstructive sleep apnea (adult) (pediatric): Secondary | ICD-10-CM | POA: Diagnosis not present

## 2018-11-17 DIAGNOSIS — R918 Other nonspecific abnormal finding of lung field: Secondary | ICD-10-CM | POA: Diagnosis not present

## 2018-11-17 DIAGNOSIS — J449 Chronic obstructive pulmonary disease, unspecified: Secondary | ICD-10-CM

## 2018-11-17 MED FILL — ALPRAZolam 0.25 MG TABS: 0.25 | 15 days supply | Qty: 30 | Fill #1

## 2018-11-17 NOTE — Progress Notes (Signed)
[facility]  Chief Complaint  Patient presents with  . OSA (obstructive sleep apnea)    Wanted to also discuss results of Chest CT done on 11/13/18.    Constitutional:  BP 109/72 (BP Location: Left Arm, Patient Position: Sitting, Cuff Size: Normal)   Pulse 89   Temp (!) 97.2 F (36.2 C)   Ht 5\' 2"  (1.575 m)   Wt 120 lb 3.2 oz (54.5 kg)   LMP  (LMP Unknown)   SpO2 99% Comment: on room air  BMI 21.98 kg/m   Past Medical History:  PNA, Atrial fibrillation, Systolic CHF, Cluster headaches, Bradycardia s/p PM, Hypertrophic cardiomyopathy  Brief Summary:  Angela Shepherd is a 68 y.o. female former smoker with COPD/emphysema and obstructive sleep apnea.  She is followed in [employer] office.  Breathing has been okay.  Not having cough, wheeze, sputum, or chest pain.  Sleeping better.  Uses Bipap nightly.  No issues with mask fit or pressure.  Denies sinus congestion, sore throat, dry mouth, or aerophagia.  CT chest from 11/13/18 reviewed by me shows new lung nodule in RUL.  Remainder of findings stable.  Physical Exam:   Appearance - well kempt   ENMT - no sinus tenderness, no nasal discharge, no oral exudate  Neck - no masses, trachea midline, no thyromegaly, no elevation in JVP  Respiratory - normal appearance of chest wall, normal respiratory effort w/o accessory muscle use, no dullness on percussion, no wheezing or rales  CV - s1s2 regular rate and rhythm, no murmurs, no peripheral edema, radial pulses symmetric  GI - soft, non tender  Lymph - no adenopathy noted in neck and axillary areas  MSK - normal gait  Ext - no cyanosis, clubbing, or joint inflammation noted  Skin - no rashes, lesions, or ulcers  Neuro - normal strength, oriented x 3  Psych - normal mood and affect   Assessment/Plan:   COPD with emphysema and asthma. - continue symbicort, singulair and prn ventolin - will try her off spiriva  Allergic  rhinitis. - continue flonase, singulair  Obstructive sleep apnea. - she was not adequately controlled with CPAP due to persistent hypoxia at night with CPAP; improved with Bipap - she is compliant with Bipap and reports benefit - continue Bipap 12/8 cm H2O  Rt upper lung nodule on CT chest from 11/13/18. - will need f/u CT chest w/o contrast in May [id number]  Chronic systolic CHF, Atrial fibrillation. - followed by Cardiology   Patient Instructions  Can try stopping spiriva  Will schedule CT chest in 6 months and follow up after this is done  A total of  28 minutes were spent face to face with the patient and more than half of that time involved counseling or coordination of care.   [name], MD [facility] Pager: [phone] 11/17/2018, 5:51 PM  Flow Sheet     Pulmonary tests:  PFT 07/29/17 >> FEV1 0.84 (41%), FEV1% 59, DLCO 41%  Chest imaging:  Cardiac CT 11/11/17 >> emphysema, Rt pleural thickening with calcification, new 4 mm nodule in RLL, new 3 mm nodule LUL. CT chest 02/07/18 >> pleural thickening, apical micronodularity CT chest 11/13/18 >> mild centrilobular emphysema, pleural thickening, 6 mm nodule RUL, stable scattered micro nodules in b/l upper lobes  Sleep tests:  HST 08/16/17 >> AHI 16.9, SpO2 low 82%, Spent 308.3 min with SpO2 < 88% CPAP 09/24/17 >> CPAP 8 cm H2O, centrals with higher pressures CPAP  11/15/17 to 02/12/18 >> used on 85 of 90 nights with average 5 hrs 30 min.  Average AHI 13.4 with CPAP 8 cm H2O. ONO with CPAP and RA 02/19/18 >> test time 1 hr 26 min.  Basal SpO2 89.5%, low SpO2 85%.  Spent 31.2 min with SpO2 < 88%. Bipap titration 05/30/18 >> 12/8 cm H2O Bipap 10/15/18 to 11/13/18 >> used on 30 of 30 nights with average 10 hrs 31 min.  Average AHI 8.5 with Bipap 12/8 cm H2O  Cardiac tests:  Echo 06/21/17 >> EF 35 to 50%, mild AR, mild/mod MR, PAS 41 mmHg  Medications:   Allergies as of 11/17/2018      Reactions    Prednisone Shortness Of Breath   CHF       Medication List       Accurate as of November 17, 2018  5:51 PM. If you have any questions, ask your nurse or doctor.        STOP taking these medications   Spiriva Respimat 2.5 MCG/ACT Aers Generic drug: Tiotropium Bromide Monohydrate Stopped by: [name], MD     TAKE these medications   AeroChamber MV inhaler Use as instructed with Symbicort   allopurinol 100 MG tablet Commonly known as: ZYLOPRIM TAKE 1/2 TABLET (50 MG TOTAL) BY MOUTH DAILY. What changed: See the new instructions.   ALPRAZolam 0.25 MG tablet Commonly known as: XANAX TAKE 1 TABLET (0.25 MG TOTAL) BY MOUTH 2 (TWO) TIMES DAILY AS NEEDED FOR ANXIETY.   AMBULATORY NON FORMULARY MEDICATION Medication Name: 100% O2 15 leters a min for 15-20 mins  Via Nasal cannual   amitriptyline 10 MG tablet Commonly known as: ELAVIL Take 1 tablet (10 mg total) by mouth at bedtime. What changed:   when to take this  reasons to take this   aspirin 81 MG chewable tablet Chew 81 mg by mouth daily.   budesonide-formoterol 160-4.5 MCG/ACT inhaler Commonly known as: Symbicort INHALE 2 PUFFS BY MOUTH INTO THE LUNGS 2 TIMES A DAY What changed:   how much to take  how to take this  when to take this  additional instructions   colchicine 0.6 MG tablet Take 1 tablet (0.6 mg total) by mouth 2 (two) times daily as needed (for gout flare ups.).   dofetilide 500 MCG capsule Commonly known as: Tikosyn Take 1 capsule (500 mcg total) by mouth 2 (two) times daily.   Eliquis 5 MG Tabs tablet Generic drug: apixaban TAKE 1 TABLET BY MOUTH TWICE DAILY What changed: how much to take   FLUoxetine 20 MG capsule Commonly known as: PROZAC TAKE 1 CAPSULE (20 MG TOTAL) BY MOUTH DAILY.   fluticasone 50 MCG/ACT nasal spray Commonly known as: FLONASE PLACE 2 SPRAYS INTO BOTH NOSTRILS DAILY. What changed:   when to take this  reasons to take this   guaiFENesin 600 MG 12 hr  tablet Commonly known as: MUCINEX Take 600 mg by mouth 2 (two) times daily as needed for cough or to loosen phlegm.   ipratropium-albuterol 0.5-2.5 (3) MG/3ML Soln Commonly known as: DUONEB USE 1 VIAL (3ML) BY NEBULIZER EVERY 4 TO 6 HOURS AS NEEDED What changed:   how much to take  how to take this  when to take this  reasons to take this  additional instructions   losartan 25 MG tablet Commonly known as: Cozaar Take 1 tablet (25 mg total) by mouth daily.   montelukast 10 MG tablet Commonly known as: SINGULAIR Take 1 tablet (10 mg  total) by mouth at bedtime.   nicotine 21 mg/24hr patch Commonly known as: NICODERM CQ - dosed in mg/24 hours Place 1 patch (21 mg total) onto the skin daily.   potassium chloride SA 20 MEQ tablet Commonly known as: KLOR-CON TAKE 1 TABLET (20 MEQ TOTAL) BY MOUTH DAILY.   SUMAtriptan 50 MG tablet Commonly known as: IMITREX TAKE 1 TABLET AT ONSET OF HEADACHE. MAY REPEAT IN 2 HOURS IF HEADACHE PERSISTS OR RECURS. What changed: See the new instructions.   torsemide 20 MG tablet Commonly known as: DEMADEX TAKE 1 TABLET BY MOUTH TWICE DAILY. **NEED APPOINTMENT FOR FURTHER REFILLS**   Ventolin HFA 108 (90 Base) MCG/ACT inhaler Generic drug: albuterol INHALE 1-2 PUFFS INTO THE LUNGS EVERY 6 (SIX) HOURS AS NEEDED FOR WHEEZING OR SHORTNESS OF BREATH. What changed: See the new instructions.       Past Surgical History:  She  has a past surgical history that includes Tonsillectomy (~ 1959); Tubal ligation (1984?); Pacemaker insertion (1994; 2002; 05/08/2012); bi-ventricular pacemaker upgrade (N/A, 05/08/2012); Cardioversion (N/A, 09/19/2017); ATRIAL FIBRILLATION ABLATION (N/A, 11/15/2017); and Cardioversion (N/A, 10/01/2018).  Family History:  Her family history includes Cancer (age of onset: 103) in her sister; Cardiomyopathy in her sister; Dementia in her mother; Diabetes in her brother and another family member; Heart disease in her sister.  Social  History:  She  reports that she quit smoking about 4 months ago. Her smoking use included cigarettes. She has a 15.00 pack-year smoking history. She has never used smokeless tobacco. She reports current alcohol use of about 2.0 standard drinks of alcohol per week. She reports that she does not use drugs.

## 2018-11-17 NOTE — Patient Instructions (Signed)
Can try stopping spiriva  Will schedule CT chest in 6 months and follow up after this is done

## 2018-11-19 ENCOUNTER — Ambulatory Visit (INDEPENDENT_AMBULATORY_CARE_PROVIDER_SITE_OTHER): Payer: 59 | Admitting: *Deleted

## 2018-11-19 ENCOUNTER — Telehealth: Payer: Self-pay | Admitting: Emergency Medicine

## 2018-11-19 DIAGNOSIS — R55 Syncope and collapse: Secondary | ICD-10-CM

## 2018-11-19 DIAGNOSIS — I4819 Other persistent atrial fibrillation: Secondary | ICD-10-CM

## 2018-11-19 LAB — CUP PACEART REMOTE DEVICE CHECK
Battery Remaining Longevity: 30 mo
Battery Remaining Percentage: 35 %
Battery Voltage: 2.83 V
Brady Statistic AP VP Percent: 34 %
Brady Statistic AP VS Percent: 1 %
Brady Statistic AS VP Percent: 65 %
Brady Statistic AS VS Percent: 1 %
Brady Statistic RA Percent Paced: 19 %
Date Time Interrogation Session: 20201111062153
Implantable Lead Implant Date: 19940705
Implantable Lead Implant Date: 19940705
Implantable Lead Implant Date: 20140501
Implantable Lead Location: 753858
Implantable Lead Location: 753859
Implantable Lead Location: 753860
Implantable Lead Model: 4024
Implantable Lead Model: 4524
Implantable Pulse Generator Implant Date: 20140501
Lead Channel Impedance Value: 380 Ohm
Lead Channel Impedance Value: 430 Ohm
Lead Channel Impedance Value: 630 Ohm
Lead Channel Pacing Threshold Amplitude: 1 V
Lead Channel Pacing Threshold Amplitude: 1.5 V
Lead Channel Pacing Threshold Amplitude: 3.75 V
Lead Channel Pacing Threshold Pulse Width: 0.4 ms
Lead Channel Pacing Threshold Pulse Width: 0.4 ms
Lead Channel Pacing Threshold Pulse Width: 1 ms
Lead Channel Sensing Intrinsic Amplitude: 1.5 mV
Lead Channel Sensing Intrinsic Amplitude: 3.3 mV
Lead Channel Setting Pacing Amplitude: 2 V
Lead Channel Setting Pacing Amplitude: 2.5 V
Lead Channel Setting Pacing Amplitude: 4.75 V
Lead Channel Setting Pacing Pulse Width: 0.4 ms
Lead Channel Setting Pacing Pulse Width: 1 ms
Lead Channel Setting Sensing Sensitivity: 2 mV
Pulse Gen Model: 3210
Pulse Gen Serial Number: 2936617

## 2018-11-19 NOTE — Telephone Encounter (Signed)
Received alert for AT/ AF burden of 43%. Currently in AF. Patient reports a small increase in SOB with activity. Declined Tikosyn load and wants to have ablation.

## 2018-11-24 MED FILL — SPIRIVA RESPIMAT 2.5 MCG IN: 2.5 | 30 days supply | Qty: 4 | Fill #2

## 2018-11-24 MED FILL — IPRAT-ALBUT 0.5-3(2.5) MG/3: 0.5-2.5 (3) | 15 days supply | Qty: 270 | Fill #1

## 2018-11-25 DIAGNOSIS — G4733 Obstructive sleep apnea (adult) (pediatric): Secondary | ICD-10-CM | POA: Diagnosis not present

## 2018-11-28 MED FILL — SYMBICORT 160-4.5 MCG INH: 160-4.5 | 30 days supply | Qty: 10 | Fill #2

## 2018-12-03 ENCOUNTER — Other Ambulatory Visit: Payer: Self-pay | Admitting: Family Medicine

## 2018-12-03 MED FILL — ALPRAZolam 0.25 MG TABS: 0.25 | 15 days supply | Qty: 30 | Fill #0

## 2018-12-03 NOTE — Telephone Encounter (Signed)
Last refill: 10.19.20 #30, 1  Last OV: 7.7.20 dx. CPE

## 2018-12-05 ENCOUNTER — Other Ambulatory Visit: Payer: Self-pay | Admitting: Family Medicine

## 2018-12-05 ENCOUNTER — Other Ambulatory Visit (HOSPITAL_COMMUNITY): Payer: Self-pay | Admitting: Internal Medicine

## 2018-12-08 MED FILL — TORSEMIDE 20 MG TABLET: 20 | 30 days supply | Qty: 60 | Fill #0

## 2018-12-08 MED FILL — FLUoxetine HCL 20 MG CAPS: 20 | 30 days supply | Qty: 30 | Fill #0

## 2018-12-11 ENCOUNTER — Encounter (HOSPITAL_COMMUNITY): Payer: Self-pay | Admitting: *Deleted

## 2018-12-11 NOTE — Progress Notes (Signed)
Remote pacemaker transmission.   

## 2018-12-11 NOTE — Progress Notes (Signed)
Pt's FMLA forms completed and signed by Dr Haroldine Laws, faxed to [employer] at [phone]

## 2018-12-15 ENCOUNTER — Other Ambulatory Visit: Payer: Self-pay | Admitting: *Deleted

## 2018-12-15 DIAGNOSIS — I4819 Other persistent atrial fibrillation: Secondary | ICD-10-CM

## 2018-12-16 ENCOUNTER — Other Ambulatory Visit: Payer: Self-pay

## 2018-12-16 ENCOUNTER — Ambulatory Visit (HOSPITAL_COMMUNITY)
Admission: RE | Admit: 2018-12-16 | Discharge: 2018-12-16 | Disposition: A | Discharge status: Home / Self Care | Payer: 59 | Source: Ambulatory Visit | Attending: Internal Medicine | Admitting: Internal Medicine

## 2018-12-16 DIAGNOSIS — I10 Essential (primary) hypertension: Secondary | ICD-10-CM

## 2018-12-16 DIAGNOSIS — I5022 Chronic systolic (congestive) heart failure: Secondary | ICD-10-CM

## 2018-12-16 DIAGNOSIS — I421 Obstructive hypertrophic cardiomyopathy: Secondary | ICD-10-CM

## 2018-12-16 DIAGNOSIS — I4819 Other persistent atrial fibrillation: Secondary | ICD-10-CM | POA: Diagnosis not present

## 2018-12-16 DIAGNOSIS — R55 Syncope and collapse: Secondary | ICD-10-CM | POA: Diagnosis not present

## 2018-12-16 NOTE — Patient Instructions (Signed)
Follow up in 6 months 

## 2018-12-16 NOTE — Addendum Note (Signed)
Addended by: Douglass Rivers D on: 12/16/2018 04:39 PM   Modules accepted: Level of Service

## 2018-12-16 NOTE — Addendum Note (Signed)
Encounter addended by: Harvie Junior, CMA on: 12/16/2018 12:31 PM  Actions taken: Clinical Note Signed

## 2018-12-16 NOTE — Progress Notes (Signed)
Heart Failure TeleHealth Note  Due to national recommendations of social distancing due to [place] 19, Audio/video telehealth visit is felt to be most appropriate for this patient at this time.  See MyChart message from today for patient consent regarding telehealth for [facility].  Date:  12/16/2018   ID:  Angela Shepherd, DOB 1950-12-10, MRN [id number]  Location: Home  Provider location: [facility] Type of Visit: Established patient  PCP:  Midge Minium, MD  Cardiologist:  No primary care provider on file. Primary HF: [place]  Chief Complaint: Heart Failure follow-up   History of Present Illness:  Angela Shepherd is a 68 y.o. 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 PMHx also notable for HTN and prior tobacco abuse.  Was admitted in December 2011 with acute HF. Echo with EF 30-35%, There was also evidence of ASH with septum of 1.7 PW = 1.9. Diuresed and scheduled for outpt cath.  Cath 12/2009: Normal coronaries. EF 40-45%.  Central aortic pressure was 86/47 with a mean of 62.  LV pressure is 74/13 with an EDP of 2.  Right atrial pressure mean of 3.  RV pressure 26/3 with an EDP of 5.  PA pressure 28/5 with a mean of 16.  Pulmonary capillary wedge pressure mean of 7.  Fick cardiac output was 3.4 and cardiac index 2.1.   Echo in March 2012:  EF 50-55%. Grade 2 diastolic dysfunction. IVS 1.4cm.  Echo 4/14: EF 40-45%  ECHO 09/03/13: EF 40-45% Echo 4/16: EF 45-50% Echo 6/19 EF 35-40% Mild LVH no LVOT obstruction  Echo 08/22/16 At [facility]: LVEF 40-45%, mild MR, Mild TR  Was seen by EP and noted to have a high percentage of RV pacing (99%) and it was felt this was worsening LV function. Underwent upgrade to [facility]. Jude CRT-P in 5/14.    Seen by [facility] in [facility] and found to be carrier for HOCM .  Admitted in 6/19 for syncope  with SBP 71/59. Arlyce Harman and losartan stopped.  She also has atrial fibrillation and has ablation with Dr. Curt Bears 11/18/2017.   Presents for telehealth visit in setting of COVID-19 pandemic. Had repeat DC-CV in 10/01/18 for recurrent AF. Now having more AF. Planning repeat AF ablation with Dr. Curt Bears 12/26/18. Having good days and bad days. Still works FT. Walks on [place] 3x/week for 30 mins. 2-3 mph. No CP. She does get SOB with mild activity. CPAP switched to BIPAP and that has helped. SBP mostly 90-100. Not taking losartan unless SBP > 100. Taking torsemide 20 bid. Edema controlled. No bleeding with Eliquis. Quit smoking 07/12/18.      Angela Shepherd denies symptoms worrisome for COVID 19.   Past Medical History:  Diagnosis Date  . Bradycardia    a. initial PPM placed 1994 at [facility] with gen change 2002. b. 1*AVB & intermittent 2nd degree AVB + CHF --> upgrade to [facility]. Jude BiV PPM 05/08/12.  . Chronic systolic CHF (congestive heart failure) ([place])    a. EF 30-35% dx in 2011 -> most recent 40-45% by echo 04/2012. b. s/p upgrade to BiV-PPM 05/08/12. c. Med titration limited by hypotension.  . Cluster headache syndrome   . Hypertr obst cardiomyop   . Hypokalemia   . Hypotension   . Paroxysmal atrial fibrillation (HCC)    a. identified on PPM interrogation b. longest  episode 1 hour; if burden increases, will need [place]   . Pneumonia 2008  . Sleep apnea    Past Surgical History:  Procedure Laterality Date  . ATRIAL FIBRILLATION ABLATION N/A 11/15/2017   Procedure: ATRIAL FIBRILLATION ABLATION;  Surgeon: Constance Haw, MD;  Location: [place] [facility];  Service: Cardiovascular;  Laterality: N/A;  . BI-VENTRICULAR PACEMAKER UPGRADE N/A 05/08/2012   upgrade to SJM Anthem (CRT-P) by Dr Rayann Heman  . CARDIOVERSION N/A 09/19/2017   Procedure: CARDIOVERSION;  Surgeon: Sanda Klein, MD;  Location: [facility]OS[facility];  Service: Cardiovascular;  Laterality: N/A;  . CARDIOVERSION N/A 10/01/2018   Procedure:  CARDIOVERSION;  Surgeon: Donato Heinz, MD;  Location: [facility] ENDOSCOPY;  Service: Endoscopy;  Laterality: N/A;  . PACEMAKER INSERTION  1994; 2002; 05/08/2012  . TONSILLECTOMY  ~ 1959  . TUBAL LIGATION  1984?     Current Outpatient Medications  Medication Sig Dispense Refill  . allopurinol (ZYLOPRIM) 100 MG tablet TAKE 1/2 TABLET (50 MG TOTAL) BY MOUTH DAILY. (Patient taking differently: Take 50 mg by mouth daily. ) 15 tablet 1  . ALPRAZolam (XANAX) 0.25 MG tablet TAKE 1 TABLET (0.25 MG TOTAL) BY MOUTH 2 (TWO) TIMES DAILY AS NEEDED FOR ANXIETY. 30 tablet 1  . AMBULATORY NON FORMULARY MEDICATION Medication Name: 100% O2 15 leters a min for 15-20 mins  Via Nasal cannual 1 Bottle 2  . amitriptyline (ELAVIL) 10 MG tablet Take 1 tablet (10 mg total) by mouth at bedtime. (Patient taking differently: Take 10 mg by mouth daily as needed (migraines). ) 30 tablet 3  . aspirin 81 MG chewable tablet Chew 81 mg by mouth daily.    . budesonide-formoterol (SYMBICORT) 160-4.5 MCG/ACT inhaler INHALE 2 PUFFS BY MOUTH INTO THE LUNGS 2 TIMES A DAY (Patient taking differently: Inhale 2 puffs into the lungs 2 (two) times daily. ) 10.2 g 2  . colchicine 0.6 MG tablet Take 1 tablet (0.6 mg total) by mouth 2 (two) times daily as needed (for gout flare ups.). 60 tablet 1  . dofetilide (TIKOSYN) 500 MCG capsule Take 1 capsule (500 mcg total) by mouth 2 (two) times daily. 60 capsule 0  . ELIQUIS 5 MG TABS tablet TAKE 1 TABLET BY MOUTH TWICE DAILY (Patient taking differently: Take 5 mg by mouth 2 (two) times daily. ) 60 tablet 5  . FLUoxetine (PROZAC) 20 MG capsule TAKE 1 CAPSULE (20 MG TOTAL) BY MOUTH DAILY. 30 capsule 3  . fluticasone (FLONASE) 50 MCG/ACT nasal spray PLACE 2 SPRAYS INTO BOTH NOSTRILS DAILY. (Patient taking differently: Place 2 sprays into both nostrils daily as needed for allergies. ) 16 g 5  . guaiFENesin (MUCINEX) 600 MG 12 hr tablet Take 600 mg by mouth 2 (two) times daily as needed for cough or to  loosen phlegm.    Marland Kitchen ipratropium-albuterol (DUONEB) 0.5-2.5 (3) MG/3ML SOLN USE 1 VIAL (3ML) BY NEBULIZER EVERY 4 TO 6 HOURS AS NEEDED (Patient taking differently: Inhale 3 mLs into the lungs every 4 (four) hours as needed (shortness of breath). ) 270 mL 1  . losartan (COZAAR) 25 MG tablet Take 1 tablet (25 mg total) by mouth daily. 30 tablet 11  . montelukast (SINGULAIR) 10 MG tablet Take 1 tablet (10 mg total) by mouth at bedtime. 30 tablet 5  . nicotine (NICODERM CQ - DOSED IN MG/24 HOURS) 21 mg/24hr patch Place 1 patch (21 mg total) onto the skin daily. 28 patch 0  . potassium chloride SA (K-DUR,KLOR-CON) 20 MEQ tablet TAKE 1  TABLET (20 MEQ TOTAL) BY MOUTH DAILY. 90 tablet 1  . Spacer/Aero-Holding Chambers (AEROCHAMBER MV) inhaler Use as instructed with Symbicort 1 each 0  . SUMAtriptan (IMITREX) 50 MG tablet TAKE 1 TABLET AT ONSET OF HEADACHE. MAY REPEAT IN 2 HOURS IF HEADACHE PERSISTS OR RECURS. (Patient taking differently: Take 50 mg by mouth every 2 (two) hours as needed for migraine. ) 10 tablet 6  . torsemide (DEMADEX) 20 MG tablet TAKE 1 TABLET BY MOUTH TWICE DAILY. **NEED APPOINTMENT FOR FURTHER REFILLS** 60 tablet 0  . VENTOLIN HFA 108 (90 Base) MCG/ACT inhaler INHALE 1-2 PUFFS INTO THE LUNGS EVERY 6 (SIX) HOURS AS NEEDED FOR WHEEZING OR SHORTNESS OF BREATH. (Patient taking differently: Inhale 2 puffs into the lungs every 6 (six) hours as needed for wheezing or shortness of breath. ) 1 Inhaler 5   No current facility-administered medications for this encounter.     Allergies:   Prednisone   Social History:  The patient  reports that she quit smoking about 5 months ago. Her smoking use included cigarettes. She has a 15.00 pack-year smoking history. She has never used smokeless tobacco. She reports current alcohol use of about 2.0 standard drinks of alcohol per week. She reports that she does not use drugs.   Family History:  The patient's family history includes Cancer (age of onset: 66)  in her sister; Cardiomyopathy in her sister; Dementia in her mother; Diabetes in her brother and another family member; Heart disease in her sister.   ROS:  Please see the history of present illness.   All other systems are personally reviewed and negative.   Exam:  (Video/Tele Health Call; Exam is subjective and or/visual.) General:  Speaks in full sentences. No resp difficulty. Lungs: Normal respiratory effort with conversation.  Abdomen: Non-distended per patient report Extremities: Pt denies edema. Neuro: Alert & oriented x 3.   Recent Labs: 07/15/2018: ALT 16; TSH 1.46 10/01/2018: BUN 27; Creatinine, Ser 1.22; Hemoglobin 15.4; Platelets 202; Potassium 3.9; Sodium 140  Personally reviewed   Wt Readings from Last 3 Encounters:  11/17/18 54.5 kg (120 lb 3.2 oz)  10/01/18 52.2 kg (115 lb)  09/17/18 52.2 kg (115 lb)      ASSESSMENT AND PLAN:  1. Chronic systolic CHF: EF [id number] s/p CRT-P upgrade 05/2012 (due to chronic RV pacing)  - Echo 6/19: LVEF 35-40%, mild MR, Mild TR - Overall stable NYHA II-III - Volume status sounds ok - Continue torsemide 20 mg BID - Recently admitted for symptomatic hypotension and losartan and spiro stopped. Only takes losartan when SBP > 100 - Off carvedilol due to low BP - Reinforced fluid restriction to < 2 L daily, sodium restriction to less than 2000 mg daily, and the importance of daily weights.   2. H/o HOCM  - Stable on most recent Echo. No LVOT gradient - Has been seen in [facility] at [facility]. All 1st degree relatives have been tested 3. HTN - BP remains low but no further syncope  - Can consider PYP scan to exclude TTR 4. Tobacco use quit 2012 - Now quit since 07/12/18 - PFTs relatively stable 06/2016. 5. Atypical chest pain - No further. Suspect GI etiology - Asked pt to follow up with any repeat chest pain.  6. Anxiety - improved. Per PCP 7. Persistent AF/AFL - Follows with Dr. Curt Bears.  - Has repeat AF ablation with Dr. Curt Bears  12/26/18. Not interested in Tikosyn because she would have to change her anxiety meds due to  risk of QT prolongation  - Continue Eliquis. She has not had bleeding   COVID screen The patient does not have any symptoms that suggest any further testing/ screening at this time.  Social distancing reinforced today.  Recommended follow-up:  6 months   Relevant cardiac medications were reviewed at length with the patient today.   The patient does not have concerns regarding their medications at this time.   The following changes were made today:  As above  Today, I have spent 14  minutes with the patient with telehealth technology discussing the above issues .    Signed, Glori Bickers, MD  12/16/2018 11:24 AM  Advanced Heart Failure [place] [phone] (office) [phone] (fax)

## 2018-12-19 ENCOUNTER — Other Ambulatory Visit: Payer: Self-pay | Admitting: Adult Health

## 2018-12-19 MED FILL — ALPRAZolam 0.25 MG TABS: 0.25 | 15 days supply | Qty: 30 | Fill #1

## 2018-12-19 MED FILL — SPIRIVA RESPIMAT 2.5 MCG IN: 2.5 | 30 days supply | Qty: 4 | Fill #3

## 2018-12-22 ENCOUNTER — Encounter (HOSPITAL_COMMUNITY): Payer: Self-pay

## 2018-12-22 ENCOUNTER — Telehealth (HOSPITAL_COMMUNITY): Payer: Self-pay | Admitting: Emergency Medicine

## 2018-12-22 MED FILL — SYMBICORT 160-4.5 MCG INH: 160-4.5 | 30 days supply | Qty: 10 | Fill #0

## 2018-12-22 NOTE — Telephone Encounter (Signed)
Left message on voicemail with name and callback number Latreshia Beauchaine RN Navigator Cardiac Imaging [facility] [phone] Office [phone] Cell  

## 2018-12-23 ENCOUNTER — Other Ambulatory Visit: Payer: Self-pay

## 2018-12-23 ENCOUNTER — Other Ambulatory Visit (HOSPITAL_COMMUNITY)
Admission: RE | Admit: 2018-12-23 | Discharge: 2018-12-23 | Disposition: A | Discharge status: Home / Self Care | Payer: 59 | Source: Ambulatory Visit | Attending: Cardiology | Admitting: Cardiology

## 2018-12-23 ENCOUNTER — Ambulatory Visit (HOSPITAL_COMMUNITY)
Admission: RE | Admit: 2018-12-23 | Discharge: 2018-12-23 | Disposition: A | Discharge status: Home / Self Care | Payer: 59 | Source: Ambulatory Visit | Attending: Cardiology | Admitting: Cardiology

## 2018-12-23 DIAGNOSIS — I4819 Other persistent atrial fibrillation: Secondary | ICD-10-CM | POA: Insufficient documentation

## 2018-12-23 DIAGNOSIS — I700 Atherosclerosis of aorta: Secondary | ICD-10-CM | POA: Diagnosis not present

## 2018-12-23 DIAGNOSIS — Z01818 Encounter for other preprocedural examination: Secondary | ICD-10-CM | POA: Insufficient documentation

## 2018-12-23 DIAGNOSIS — Z20828 Contact with and (suspected) exposure to other viral communicable diseases: Secondary | ICD-10-CM | POA: Insufficient documentation

## 2018-12-23 LAB — BASIC METABOLIC PANEL
Anion gap: 11 (ref 5–15)
BUN: 13 mg/dL (ref 8–23)
CO2: 26 mmol/L (ref 22–32)
Calcium: 8.4 mg/dL — ABNORMAL LOW (ref 8.9–10.3)
Chloride: 102 mmol/L (ref 98–111)
Creatinine, Ser: 1.09 mg/dL — ABNORMAL HIGH (ref 0.44–1.00)
GFR calc Af Amer: >60 mL/min (ref >60)
GFR calc non Af Amer: 52 mL/min — ABNORMAL LOW (ref >60)
Glucose, Bld: 121 mg/dL — ABNORMAL HIGH (ref 70–99)
Potassium: 3.9 mmol/L (ref 3.5–5.1)
Sodium: 139 mmol/L (ref 135–145)

## 2018-12-23 LAB — CBC
HCT: 43.4 % (ref 36.0–46.0)
Hemoglobin: 13.9 g/dL (ref 12.0–15.0)
MCH: 33.6 pg (ref 26.0–34.0)
MCHC: 32 g/dL (ref 30.0–36.0)
MCV: 104.8 fL — ABNORMAL HIGH (ref 80.0–100.0)
Platelets: 162 10*3/uL (ref 150–400)
RBC: 4.14 MIL/uL (ref 3.87–5.11)
RDW: 15.7 % — ABNORMAL HIGH (ref 11.5–15.5)
WBC: 4.6 10*3/uL (ref 4.0–10.5)
nRBC: 0 % (ref 0.0–0.2)

## 2018-12-23 LAB — POCT I-STAT CREATININE: Creatinine, Ser: 1.1 mg/dL — ABNORMAL HIGH (ref 0.44–1.00)

## 2018-12-23 MED ORDER — METOPROLOL TARTRATE 5 MG/5ML IV SOLN: 10.0000 mg | Freq: Once | INTRAVENOUS | Status: AC

## 2018-12-23 MED ORDER — METOPROLOL TARTRATE 5 MG/5ML IV SOLN
INTRAVENOUS | Status: AC
  Administered 2018-12-23: 10:00:00 10 mg via INTRAVENOUS
  Filled 2018-12-23: qty 10

## 2018-12-23 MED ORDER — IOHEXOL 350 MG/ML SOLN
100.0000 mL | Freq: Once | INTRAVENOUS | Status: AC | PRN
  Administered 2018-12-23: 100 mL via INTRAVENOUS

## 2018-12-24 LAB — NOVEL CORONAVIRUS, NAA (HOSP ORDER, SEND-OUT TO REF LAB; TAT 18-24 HRS): SARS-CoV-2, NAA: NOT DETECTED

## 2018-12-25 DIAGNOSIS — G4733 Obstructive sleep apnea (adult) (pediatric): Secondary | ICD-10-CM | POA: Diagnosis not present

## 2018-12-26 ENCOUNTER — Ambulatory Visit (HOSPITAL_COMMUNITY): Payer: 59 | Admitting: Anesthesiology

## 2018-12-26 ENCOUNTER — Encounter (HOSPITAL_COMMUNITY)
Admission: RE | Disposition: A | Discharge status: Home / Self Care | Payer: Self-pay | Source: Home / Self Care | Attending: Cardiology

## 2018-12-26 ENCOUNTER — Other Ambulatory Visit: Payer: Self-pay

## 2018-12-26 ENCOUNTER — Ambulatory Visit (HOSPITAL_COMMUNITY)
Admission: RE | Admit: 2018-12-26 | Discharge: 2018-12-26 | Disposition: A | Discharge status: Home / Self Care | Payer: 59 | Attending: Cardiology | Admitting: Cardiology

## 2018-12-26 ENCOUNTER — Encounter (HOSPITAL_COMMUNITY): Payer: Self-pay | Admitting: Cardiology

## 2018-12-26 DIAGNOSIS — I4819 Other persistent atrial fibrillation: Secondary | ICD-10-CM | POA: Diagnosis not present

## 2018-12-26 DIAGNOSIS — I5042 Chronic combined systolic (congestive) and diastolic (congestive) heart failure: Secondary | ICD-10-CM | POA: Diagnosis not present

## 2018-12-26 DIAGNOSIS — I483 Typical atrial flutter: Secondary | ICD-10-CM

## 2018-12-26 DIAGNOSIS — Z87891 Personal history of nicotine dependence: Secondary | ICD-10-CM | POA: Diagnosis not present

## 2018-12-26 DIAGNOSIS — N182 Chronic kidney disease, stage 2 (mild): Secondary | ICD-10-CM | POA: Diagnosis not present

## 2018-12-26 DIAGNOSIS — I484 Atypical atrial flutter: Secondary | ICD-10-CM

## 2018-12-26 DIAGNOSIS — Z888 Allergy status to other drugs, medicaments and biological substances status: Secondary | ICD-10-CM | POA: Diagnosis not present

## 2018-12-26 DIAGNOSIS — I110 Hypertensive heart disease with heart failure: Secondary | ICD-10-CM | POA: Diagnosis not present

## 2018-12-26 DIAGNOSIS — Z950 Presence of cardiac pacemaker: Secondary | ICD-10-CM | POA: Insufficient documentation

## 2018-12-26 DIAGNOSIS — I5022 Chronic systolic (congestive) heart failure: Secondary | ICD-10-CM | POA: Insufficient documentation

## 2018-12-26 DIAGNOSIS — I130 Hypertensive heart and chronic kidney disease with heart failure and stage 1 through stage 4 chronic kidney disease, or unspecified chronic kidney disease: Secondary | ICD-10-CM | POA: Diagnosis not present

## 2018-12-26 DIAGNOSIS — I422 Other hypertrophic cardiomyopathy: Secondary | ICD-10-CM | POA: Insufficient documentation

## 2018-12-26 DIAGNOSIS — G4730 Sleep apnea, unspecified: Secondary | ICD-10-CM | POA: Diagnosis not present

## 2018-12-26 HISTORY — PX: ATRIAL FIBRILLATION ABLATION: EP1191

## 2018-12-26 LAB — POCT ACTIVATED CLOTTING TIME
Activated Clotting Time: 296 seconds
Activated Clotting Time: 351 seconds

## 2018-12-26 SURGERY — ATRIAL FIBRILLATION ABLATION
Anesthesia: General

## 2018-12-26 MED ORDER — SODIUM CHLORIDE 0.9% FLUSH: 3.0000 mL | INTRAVENOUS | Status: DC | PRN

## 2018-12-26 MED ORDER — HEPARIN (PORCINE) IN NACL 1000-0.9 UT/500ML-% IV SOLN
INTRAVENOUS | Status: DC | PRN
  Administered 2018-12-26 (×5): 500 mL

## 2018-12-26 MED ORDER — CEFAZOLIN SODIUM-DEXTROSE 2-4 GM/100ML-% IV SOLN
INTRAVENOUS | Status: AC
  Filled 2018-12-26: qty 100

## 2018-12-26 MED ORDER — BUPIVACAINE HCL (PF) 0.25 % IJ SOLN
INTRAMUSCULAR | Status: AC
  Filled 2018-12-26: qty 60

## 2018-12-26 MED ORDER — CEFAZOLIN SODIUM-DEXTROSE 2-3 GM-%(50ML) IV SOLR
INTRAVENOUS | Status: DC | PRN
  Administered 2018-12-26: 2 g via INTRAVENOUS

## 2018-12-26 MED ORDER — PHENYLEPHRINE HCL (PRESSORS) 10 MG/ML IV SOLN
INTRAVENOUS | Status: DC | PRN
  Administered 2018-12-26: 80 ug via INTRAVENOUS

## 2018-12-26 MED ORDER — ACETAMINOPHEN 325 MG PO TABS
650.0000 mg | ORAL_TABLET | ORAL | Status: DC | PRN
  Filled 2018-12-26: qty 2

## 2018-12-26 MED ORDER — ROCURONIUM BROMIDE 100 MG/10ML IV SOLN
INTRAVENOUS | Status: DC | PRN
  Administered 2018-12-26: 50 mg via INTRAVENOUS

## 2018-12-26 MED ORDER — SODIUM CHLORIDE 0.9 % IV SOLN: INTRAVENOUS | Status: DC

## 2018-12-26 MED ORDER — DOBUTAMINE IN D5W 4-5 MG/ML-% IV SOLN
INTRAVENOUS | Status: AC
  Filled 2018-12-26: qty 250

## 2018-12-26 MED ORDER — ACETAMINOPHEN 500 MG PO TABS
1000.0000 mg | ORAL_TABLET | Freq: Once | ORAL | Status: AC
  Administered 2018-12-26: 1000 mg via ORAL

## 2018-12-26 MED ORDER — ONDANSETRON HCL 4 MG/2ML IJ SOLN
INTRAMUSCULAR | Status: DC | PRN
  Administered 2018-12-26 (×2): 4 mg via INTRAVENOUS

## 2018-12-26 MED ORDER — LIDOCAINE HCL (CARDIAC) PF 100 MG/5ML IV SOSY
PREFILLED_SYRINGE | INTRAVENOUS | Status: DC | PRN
  Administered 2018-12-26: 60 mg via INTRAVENOUS

## 2018-12-26 MED ORDER — SUGAMMADEX SODIUM 200 MG/2ML IV SOLN
INTRAVENOUS | Status: DC | PRN
  Administered 2018-12-26: 200 mg via INTRAVENOUS

## 2018-12-26 MED ORDER — SODIUM CHLORIDE 0.9 % IV SOLN: 250.0000 mL | INTRAVENOUS | Status: DC | PRN

## 2018-12-26 MED ORDER — ONDANSETRON HCL 4 MG/2ML IJ SOLN: 4.0000 mg | Freq: Four times a day (QID) | INTRAMUSCULAR | Status: DC | PRN

## 2018-12-26 MED ORDER — SODIUM CHLORIDE 0.9% FLUSH: 3.0000 mL | Freq: Two times a day (BID) | INTRAVENOUS | Status: DC

## 2018-12-26 MED ORDER — FENTANYL CITRATE (PF) 100 MCG/2ML IJ SOLN
INTRAMUSCULAR | Status: DC | PRN
  Administered 2018-12-26 (×2): 50 ug via INTRAVENOUS

## 2018-12-26 MED ORDER — ACETAMINOPHEN 500 MG PO TABS
ORAL_TABLET | ORAL | Status: AC
  Filled 2018-12-26: qty 2

## 2018-12-26 MED ORDER — BUPIVACAINE HCL (PF) 0.25 % IJ SOLN
INTRAMUSCULAR | Status: DC | PRN
  Administered 2018-12-26: 60 mL

## 2018-12-26 MED ORDER — HEPARIN SODIUM (PORCINE) 1000 UNIT/ML IJ SOLN
INTRAMUSCULAR | Status: DC | PRN
  Administered 2018-12-26: 1000 [IU] via INTRAVENOUS

## 2018-12-26 MED ORDER — MIDAZOLAM HCL 5 MG/5ML IJ SOLN
INTRAMUSCULAR | Status: DC | PRN
  Administered 2018-12-26: 2 mg via INTRAVENOUS

## 2018-12-26 MED ORDER — HEPARIN SODIUM (PORCINE) 1000 UNIT/ML IJ SOLN
INTRAMUSCULAR | Status: DC | PRN
  Administered 2018-12-26: 3000 [IU] via INTRAVENOUS
  Administered 2018-12-26: 13000 [IU] via INTRAVENOUS
  Administered 2018-12-26: 1000 [IU] via INTRAVENOUS

## 2018-12-26 MED ORDER — PROPOFOL 10 MG/ML IV BOLUS
INTRAVENOUS | Status: DC | PRN
  Administered 2018-12-26: 130 mg via INTRAVENOUS

## 2018-12-26 MED ORDER — DOBUTAMINE IN D5W 4-5 MG/ML-% IV SOLN
INTRAVENOUS | Status: DC | PRN
  Administered 2018-12-26: 20 ug/kg/min via INTRAVENOUS

## 2018-12-26 MED ORDER — PROTAMINE SULFATE 10 MG/ML IV SOLN
INTRAVENOUS | Status: DC | PRN
  Administered 2018-12-26: 10 mg via INTRAVENOUS
  Administered 2018-12-26 (×2): 20 mg via INTRAVENOUS

## 2018-12-26 MED ORDER — HEPARIN (PORCINE) IN NACL 1000-0.9 UT/500ML-% IV SOLN
INTRAVENOUS | Status: AC
  Filled 2018-12-26: qty 2500

## 2018-12-26 MED ORDER — PHENYLEPHRINE HCL-NACL 10-0.9 MG/250ML-% IV SOLN
INTRAVENOUS | Status: DC | PRN
  Administered 2018-12-26: 20 ug/min via INTRAVENOUS

## 2018-12-26 SURGICAL SUPPLY — 22 items
BLANKET WARM UNDERBOD FULL ACC (MISCELLANEOUS) ×3 IMPLANT
CATH MAPPNG PENTARAY F 2-6-2MM (CATHETERS) IMPLANT
CATH SMTCH THERMOCOOL SF DF (CATHETERS) ×2 IMPLANT
CATH SOUNDSTAR ECO 8FR (CATHETERS) ×2 IMPLANT
CATH WEBSTER BI DIR CS D-F CRV (CATHETERS) ×2 IMPLANT
COVER SWIFTLINK CONNECTOR (BAG) ×3 IMPLANT
DEVICE CLOSURE PERCLS PRGLD 6F (VASCULAR PRODUCTS) IMPLANT
PACK EP LATEX FREE (CUSTOM PROCEDURE TRAY) ×2
PACK EP LF (CUSTOM PROCEDURE TRAY) ×1 IMPLANT
PAD PRO RADIOLUCENT 2001M-C (PAD) ×3 IMPLANT
PATCH CARTO3 (PAD) ×2 IMPLANT
PENTARAY F 2-6-2MM (CATHETERS) ×3
PERCLOSE PROGLIDE 6F (VASCULAR PRODUCTS) ×12
SHEATH BAYLIS SUREFLEX  M 8.5 (SHEATH) ×2
SHEATH BAYLIS SUREFLEX M 8.5 (SHEATH) IMPLANT
SHEATH BAYLIS TRANSSEPTAL 98CM (NEEDLE) ×2 IMPLANT
SHEATH CARTO VIZIGO SM CVD (SHEATH) ×2 IMPLANT
SHEATH PINNACLE 7F 10CM (SHEATH) ×2 IMPLANT
SHEATH PINNACLE 8F 10CM (SHEATH) ×4 IMPLANT
SHEATH PINNACLE 9F 10CM (SHEATH) ×2 IMPLANT
SHEATH PROBE COVER 6X72 (BAG) ×2 IMPLANT
TUBING SMART ABLATE COOLFLOW (TUBING) ×2 IMPLANT

## 2018-12-26 NOTE — H&P (Signed)
Electrophysiology Office Note   Date:  12/26/2018   ID:  Perlina, Kohman 1950-11-20, MRN [id number]  PCP:  Midge Minium, MD  Cardiologist:  [facility] Electrophysiologist:  Leslie Jester Meredith Leeds, MD    No chief complaint on file.    History of Present Illness: Angela Shepherd is a 68 y.o. female who is being seen today for the evaluation of HOCM at the request of No ref. provider found. Presenting today for electrophysiology evaluation.  History of hypertrophic cardiomyopathy, bradycardia status post pacemaker in the 1990s at [facility], hypertension, tobacco abuse who quit in 2012.  She was admitted December 2011 with acute heart failure was found to have an ejection fraction of 30 to 35%.  Catheterization showed normal coronary arteries with an EF of 40 to 45%.  She was seen by EP and was found to have a high burden of RV pacing and was felt to be causing her LV dysfunction.  She underwent upgrade to a [employer] CRT-P 05/2012.  Echo in 2016 showed an EF of 45 to 50%.  He had a cardioversion on 09/19/2017, but unfortunately quickly went back into atrial fibrillation.  Today, denies symptoms of palpitations, chest pain, shortness of breath, orthopnea, PND, lower extremity edema, claudication, dizziness, presyncope, syncope, bleeding, or neurologic sequela. The patient is tolerating medications without difficulties. Plan for repeat AF ablation today.   Past Medical History:  Diagnosis Date  . Bradycardia    a. initial PPM placed 1994 at [facility] with gen change 2002. b. 1*AVB & intermittent 2nd degree AVB + CHF --> upgrade to [facility]. Jude BiV PPM 05/08/12.  . Chronic systolic CHF (congestive heart failure) ([place])    a. EF 30-35% dx in 2011 -> most recent 40-45% by echo 04/2012. b. s/p upgrade to BiV-PPM 05/08/12. c. Med titration limited by hypotension.  . Cluster headache syndrome   . Hypertr obst cardiomyop   . Hypokalemia   . Hypotension   . Paroxysmal atrial  fibrillation (HCC)    a. identified on PPM interrogation b. longest episode 1 hour; if burden increases, Angela Shepherd need [place]   . Pneumonia 2008  . Sleep apnea    Past Surgical History:  Procedure Laterality Date  . ATRIAL FIBRILLATION ABLATION N/A 11/15/2017   Procedure: ATRIAL FIBRILLATION ABLATION;  Surgeon: Constance Haw, MD;  Location: [place] CV LAB;  Service: Cardiovascular;  Laterality: N/A;  . BI-VENTRICULAR PACEMAKER UPGRADE N/A 05/08/2012   upgrade to SJM Anthem (CRT-P) by Dr Rayann Heman  . CARDIOVERSION N/A 09/19/2017   Procedure: CARDIOVERSION;  Surgeon: Sanda Klein, MD;  Location: [facility] ENDOSCOPY;  Service: Cardiovascular;  Laterality: N/A;  . CARDIOVERSION N/A 10/01/2018   Procedure: CARDIOVERSION;  Surgeon: Donato Heinz, MD;  Location: [facility] ENDOSCOPY;  Service: Endoscopy;  Laterality: N/A;  . PACEMAKER INSERTION  1994; 2002; 05/08/2012  . TONSILLECTOMY  ~ 1959  . TUBAL LIGATION  1984?     Current Facility-Administered Medications  Medication Dose Route Frequency Provider Last Rate Last Admin  . 0.9 %  sodium chloride infusion   Intravenous Continuous Constance Haw, MD 50 mL/hr at 12/26/18 0917 New Bag at 12/26/18 0917  . acetaminophen (TYLENOL) 500 MG tablet             Allergies:   Prednisone   Social History:  The patient  reports that she quit smoking about 5 months ago. Her smoking use included cigarettes. She has a 15.00 pack-year smoking history. She has never used smokeless  tobacco. She reports current alcohol use of about 2.0 standard drinks of alcohol per week. She reports that she does not use drugs.   Family History:  The patient's family history includes Cancer (age of onset: 34) in her sister; Cardiomyopathy in her sister; Dementia in her mother; Diabetes in her brother and another family member; Heart disease in her sister.    ROS:  Please see the history of present illness.   Otherwise, review of systems is positive for none.   All other  systems are reviewed and negative.   PHYSICAL EXAM: VS:  BP 124/89   Pulse 94   Temp 97.7 F (36.5 C) (Skin)   Resp 18   Ht 5\' 2"  (1.575 m)   Wt 54.4 kg   LMP  (LMP Unknown)   SpO2 100%   BMI 21.95 kg/m  , BMI Body mass index is 21.95 kg/m. GEN: Well nourished, well developed, in no acute distress  HEENT: normal  Neck: no JVD, carotid bruits, or masses Cardiac: RRR; no murmurs, rubs, or gallops,no edema  Respiratory:  clear to auscultation bilaterally, normal work of breathing GI: soft, nontender, nondistended, + BS MS: no deformity or atrophy  Skin: warm and dry, device site well healed Neuro:  Strength and sensation are intact Psych: euthymic mood, full affect  Personal review of the device interrogation today. Results in University: 07/15/2018: ALT 16; TSH 1.46 12/23/2018: BUN 13; Creatinine, Ser 1.09; Hemoglobin 13.9; Platelets 162; Potassium 3.9; Sodium 139    Lipid Panel     Component Value Date/Time   CHOL 193 07/15/2018 1439   TRIG 63 07/15/2018 1439   HDL 60 07/15/2018 1439   CHOLHDL 3.2 07/15/2018 1439   VLDL 8 06/21/2017 0143   LDLCALC 118 (H) 07/15/2018 1439   LDLDIRECT 149.0 06/02/2015 1351     Wt Readings from Last 3 Encounters:  12/26/18 54.4 kg  11/17/18 54.5 kg  10/01/18 52.2 kg      Other studies Reviewed: Additional studies/ records that were reviewed today include: TTE 06/21/17  Review of the above records today demonstrates:   - Left ventricle: Wall thickness was increased in a pattern of mild   LVH. Systolic function was moderately reduced. The estimated   ejection fraction was in the range of 35% to 40%. Severe   hypokinesis of the mid-apicalanteroseptal, lateral,   inferolateral, and apical myocardium. - Ventricular septum: Septal motion showed abnormal function and   dyssynergy. - Aortic valve: There was mild regurgitation. - Mitral valve: There was mild to moderate regurgitation. - Left atrium: The atrium was  moderately dilated. - Pulmonary arteries: Systolic pressure was moderately increased.   PA peak pressure: 41 mm Hg (S).   ASSESSMENT AND PLAN:  1.  Chronic systolic heart failure: This recent ejection fraction 35 to 40%.  Currently on optimal medical therapy per heart failure.  2.  Hypertrophic cardiomyopathy: Stable on most recent echo.  Per primary cardiology.  3.  Hypertension: Well-controlled today.  No changes.  4.  Atrial fibrillation/atrial flutter:   MADDOX BELLEAU has presented today for surgery, with the diagnosis of atrial fibrillation/flutter.  The various methods of treatment have been discussed with the patient and family. After consideration of risks, benefits and other options for treatment, the patient has consented to  Procedure(s): Catheter ablation as a surgical intervention .  Risks include but not limited to bleeding, tamponade, heart block, stroke, damage to surrounding organs, among others. The patient's history has  been reviewed, patient examined, no change in status, stable for surgery.  I have reviewed the patient's chart and labs.  Questions were answered to the patient's satisfaction.    Chardai Gangemi Curt Bears, MD 12/26/2018 10:43 AM

## 2018-12-26 NOTE — Discharge Instructions (Signed)

## 2018-12-26 NOTE — Anesthesia Procedure Notes (Signed)
Procedure Name: Intubation Date/Time: 12/26/2018 11:36 AM Performed by: Faron Whitelock T, CRNA Pre-anesthesia Checklist: Patient identified, Emergency Drugs available, Suction available and Patient being monitored Patient Re-evaluated:Patient Re-evaluated prior to induction Oxygen Delivery Method: Circle system utilized Preoxygenation: Pre-oxygenation with 100% oxygen Induction Type: IV induction Ventilation: Mask ventilation without difficulty Laryngoscope Size: Mac and 3 Grade View: Grade I Tube type: Oral Tube size: 7.5 mm Number of attempts: 1 Airway Equipment and Method: Patient positioned with wedge pillow and Stylet Placement Confirmation: ETT inserted through vocal cords under direct vision,  positive ETCO2 and breath sounds checked- equal and bilateral Secured at: 22 cm Tube secured with: Tape Dental Injury: Teeth and Oropharynx as per pre-operative assessment

## 2018-12-26 NOTE — Transfer of Care (Signed)
Immediate Anesthesia Transfer of Care Note  Patient: Angela Shepherd  Procedure(s) Performed: ATRIAL FIBRILLATION ABLATION (N/A )  Patient Location: Cath Lab  Anesthesia Type:General  Level of Consciousness: drowsy  Airway & Oxygen Therapy: Patient Spontanous Breathing and Patient connected to nasal cannula oxygen  Post-op Assessment: Report given to RN, Post -op Vital signs reviewed and stable and Patient moving all extremities  Post vital signs: Reviewed and stable  Last Vitals:  Vitals Value Taken Time  BP    Temp    Pulse 75 12/26/18 1519  Resp 16 12/26/18 1519  SpO2 97 % 12/26/18 1519  Vitals shown include unvalidated device data.  Last Pain:  Vitals:   12/26/18 0847  TempSrc: Skin         Complications: No apparent anesthesia complications

## 2018-12-26 NOTE — Progress Notes (Signed)
No bleeding or hematoma noted after ambulation 

## 2018-12-26 NOTE — Anesthesia Preprocedure Evaluation (Signed)
Anesthesia Evaluation  Patient identified by MRN, date of birth, ID band Patient awake    Reviewed: Allergy & Precautions, NPO status , Patient's Chart, lab work & pertinent test results  Airway Mallampati: II  TM Distance: >3 FB Neck ROM: Full    Dental  (+) Teeth Intact, Dental Advisory Given   Pulmonary sleep apnea , COPD, former smoker,    Pulmonary exam normal breath sounds clear to auscultation       Cardiovascular hypertension, Pt. on home beta blockers and Pt. on medications +CHF  + dysrhythmias Atrial Fibrillation + pacemaker  Rhythm:Irregular Rate:Abnormal     Neuro/Psych  Headaches, PSYCHIATRIC DISORDERS Anxiety Depression    GI/Hepatic negative GI ROS, Neg liver ROS,   Endo/Other  negative endocrine ROS  Renal/GU Renal InsufficiencyRenal disease     Musculoskeletal negative musculoskeletal ROS (+)   Abdominal   Peds  Hematology negative hematology ROS (+)   Anesthesia Other Findings Day of surgery medications reviewed with the patient.  Reproductive/Obstetrics                             Anesthesia Physical Anesthesia Plan  ASA: III  Anesthesia Plan: General   Post-op Pain Management:    Induction: Intravenous  PONV Risk Score and Plan: 3 and Dexamethasone and Ondansetron  Airway Management Planned: Oral ETT  Additional Equipment:   Intra-op Plan:   Post-operative Plan: Extubation in OR  Informed Consent: I have reviewed the patients History and Physical, chart, labs and discussed the procedure including the risks, benefits and alternatives for the proposed anesthesia with the patient or authorized representative who has indicated his/her understanding and acceptance.     Dental advisory given  Plan Discussed with: CRNA  Anesthesia Plan Comments:        Anesthesia Quick Evaluation

## 2018-12-26 NOTE — Anesthesia Postprocedure Evaluation (Signed)
Anesthesia Post Note  Patient: Angela Shepherd  Procedure(s) Performed: ATRIAL FIBRILLATION ABLATION (N/A )     Patient location during evaluation: PACU Anesthesia Type: General Level of consciousness: awake and alert Pain management: pain level controlled Vital Signs Assessment: post-procedure vital signs reviewed and stable Respiratory status: spontaneous breathing, nonlabored ventilation and respiratory function stable Cardiovascular status: blood pressure returned to baseline and stable Postop Assessment: no apparent nausea or vomiting Anesthetic complications: no    Last Vitals:  Vitals:   12/26/18 1627 12/26/18 1640  BP: (!) 82/59 (!) 96/31  Pulse: 72 74  Resp: 15 16  Temp:    SpO2: 94% 92%    Last Pain:  Vitals:   12/26/18 1640  TempSrc:   PainSc: 0-No pain                 [name]

## 2019-01-05 ENCOUNTER — Other Ambulatory Visit: Payer: Self-pay | Admitting: Physician Assistant

## 2019-01-05 ENCOUNTER — Other Ambulatory Visit (HOSPITAL_COMMUNITY): Payer: Self-pay | Admitting: Internal Medicine

## 2019-01-05 MED FILL — TORSEMIDE 20 MG TABLET: 20 | 90 days supply | Qty: 180 | Fill #0

## 2019-01-06 MED FILL — ALPRAZolam 0.25 MG TABS: 0.25 | 15 days supply | Qty: 30 | Fill #0

## 2019-01-06 NOTE — Telephone Encounter (Signed)
Last OV 07/15/18 Alprazolam last filled 12/03/18 #30 with 1

## 2019-01-07 MED FILL — FLUoxetine HCL 20 MG CAPS: 20 | 30 days supply | Qty: 30 | Fill #1

## 2019-01-13 ENCOUNTER — Other Ambulatory Visit: Payer: Self-pay | Admitting: Primary Care

## 2019-01-13 MED FILL — SPIRIVA RESPIMAT 2.5 MCG IN: 2.5 | 30 days supply | Qty: 4 | Fill #4

## 2019-01-13 MED FILL — IPRAT-ALBUT 0.5-3(2.5) MG/3: 0.5-2.5 (3) | 15 days supply | Qty: 270 | Fill #0

## 2019-01-22 MED FILL — ALPRAZolam 0.25 MG TABS: 0.25 | 15 days supply | Qty: 30 | Fill #1

## 2019-01-26 ENCOUNTER — Encounter (HOSPITAL_COMMUNITY): Payer: Self-pay | Admitting: Nurse Practitioner

## 2019-01-26 ENCOUNTER — Ambulatory Visit (HOSPITAL_COMMUNITY)
Admission: RE | Admit: 2019-01-26 | Discharge: 2019-01-26 | Disposition: A | Discharge status: Home / Self Care | Payer: 59 | Source: Ambulatory Visit | Attending: Nurse Practitioner | Admitting: Nurse Practitioner

## 2019-01-26 ENCOUNTER — Other Ambulatory Visit: Payer: Self-pay

## 2019-01-26 VITALS — BP 110/72 | HR 72 | Ht 62.0 in | Wt 123.8 lb

## 2019-01-26 DIAGNOSIS — E876 Hypokalemia: Secondary | ICD-10-CM | POA: Diagnosis not present

## 2019-01-26 DIAGNOSIS — Z818 Family history of other mental and behavioral disorders: Secondary | ICD-10-CM | POA: Diagnosis not present

## 2019-01-26 DIAGNOSIS — Z7901 Long term (current) use of anticoagulants: Secondary | ICD-10-CM | POA: Insufficient documentation

## 2019-01-26 DIAGNOSIS — Z8049 Family history of malignant neoplasm of other genital organs: Secondary | ICD-10-CM | POA: Insufficient documentation

## 2019-01-26 DIAGNOSIS — Z833 Family history of diabetes mellitus: Secondary | ICD-10-CM | POA: Diagnosis not present

## 2019-01-26 DIAGNOSIS — Z79899 Other long term (current) drug therapy: Secondary | ICD-10-CM | POA: Diagnosis not present

## 2019-01-26 DIAGNOSIS — F419 Anxiety disorder, unspecified: Secondary | ICD-10-CM | POA: Diagnosis not present

## 2019-01-26 DIAGNOSIS — I4819 Other persistent atrial fibrillation: Secondary | ICD-10-CM | POA: Diagnosis not present

## 2019-01-26 DIAGNOSIS — D6869 Other thrombophilia: Secondary | ICD-10-CM

## 2019-01-26 DIAGNOSIS — I5022 Chronic systolic (congestive) heart failure: Secondary | ICD-10-CM | POA: Diagnosis not present

## 2019-01-26 DIAGNOSIS — Z950 Presence of cardiac pacemaker: Secondary | ICD-10-CM | POA: Insufficient documentation

## 2019-01-26 DIAGNOSIS — Z882 Allergy status to sulfonamides status: Secondary | ICD-10-CM | POA: Diagnosis not present

## 2019-01-26 DIAGNOSIS — I480 Paroxysmal atrial fibrillation: Secondary | ICD-10-CM | POA: Diagnosis not present

## 2019-01-26 DIAGNOSIS — G4730 Sleep apnea, unspecified: Secondary | ICD-10-CM | POA: Insufficient documentation

## 2019-01-26 DIAGNOSIS — Z8249 Family history of ischemic heart disease and other diseases of the circulatory system: Secondary | ICD-10-CM | POA: Insufficient documentation

## 2019-01-26 DIAGNOSIS — Z7982 Long term (current) use of aspirin: Secondary | ICD-10-CM | POA: Insufficient documentation

## 2019-01-26 DIAGNOSIS — R9431 Abnormal electrocardiogram [ECG] [EKG]: Secondary | ICD-10-CM | POA: Diagnosis not present

## 2019-01-26 DIAGNOSIS — Z9851 Tubal ligation status: Secondary | ICD-10-CM | POA: Insufficient documentation

## 2019-01-26 DIAGNOSIS — Z87891 Personal history of nicotine dependence: Secondary | ICD-10-CM | POA: Diagnosis not present

## 2019-01-26 LAB — BASIC METABOLIC PANEL
Anion gap: 12 (ref 5–15)
BUN: 18 mg/dL (ref 8–23)
CO2: 26 mmol/L (ref 22–32)
Calcium: 9.2 mg/dL (ref 8.9–10.3)
Chloride: 100 mmol/L (ref 98–111)
Creatinine, Ser: 1.27 mg/dL — ABNORMAL HIGH (ref 0.44–1.00)
GFR calc Af Amer: 51 mL/min — ABNORMAL LOW (ref >60)
GFR calc non Af Amer: 44 mL/min — ABNORMAL LOW (ref >60)
Glucose, Bld: 97 mg/dL (ref 70–99)
Potassium: 3.6 mmol/L (ref 3.5–5.1)
Sodium: 138 mmol/L (ref 135–145)

## 2019-01-26 LAB — MAGNESIUM: Magnesium: 2.1 mg/dL (ref 1.7–2.4)

## 2019-01-26 NOTE — Progress Notes (Signed)
Primary Care Physician: Midge Minium, MD Referring Physician: Dr. Nedra Hai is a 69 y.o. female with a h/o BiV pacer, PAF with ablation one month ago. She  feels well.  Did notice some racing in the first 1-2 weeks after ablation. None in the last 2 weeks. NO swallowing or gorin issues. A sensed v paced today. On eliquis with a CHA2DS2VASc score of at least 3.  Today, she denies symptoms of palpitations, chest pain, shortness of breath, orthopnea, PND, lower extremity edema, dizziness, presyncope, syncope, or neurologic sequela. The patient is tolerating medications without difficulties and is otherwise without complaint today.   Past Medical History:  Diagnosis Date  . Bradycardia    a. initial PPM placed 1994 at [facility] with gen change 2002. b. 1*AVB & intermittent 2nd degree AVB + CHF --> upgrade to [facility]. Jude BiV PPM 05/08/12.  . Chronic systolic CHF (congestive heart failure) ([place])    a. EF 30-35% dx in 2011 -> most recent 40-45% by echo 04/2012. b. s/p upgrade to BiV-PPM 05/08/12. c. Med titration limited by hypotension.  . Cluster headache syndrome   . Hypertr obst cardiomyop   . Hypokalemia   . Hypotension   . Paroxysmal atrial fibrillation (HCC)    a. identified on PPM interrogation b. longest episode 1 hour; if burden increases, will need [place]   . Pneumonia 2008  . Sleep apnea    Past Surgical History:  Procedure Laterality Date  . ATRIAL FIBRILLATION ABLATION N/A 11/15/2017   Procedure: ATRIAL FIBRILLATION ABLATION;  Surgeon: Constance Haw, MD;  Location: [place] CV LAB;  Service: Cardiovascular;  Laterality: N/A;  . ATRIAL FIBRILLATION ABLATION N/A 12/26/2018   Procedure: ATRIAL FIBRILLATION ABLATION;  Surgeon: Constance Haw, MD;  Location: [place] CV LAB;  Service: Cardiovascular;  Laterality: N/A;  . BI-VENTRICULAR PACEMAKER UPGRADE N/A 05/08/2012   upgrade to SJM Anthem (CRT-P) by Dr Rayann Heman  . CARDIOVERSION N/A 09/19/2017   Procedure: CARDIOVERSION;  Surgeon: Sanda Klein, MD;  Location: [place] ENDOSCOPY;  Service: Cardiovascular;  Laterality: N/A;  . CARDIOVERSION N/A 10/01/2018   Procedure: CARDIOVERSION;  Surgeon: Donato Heinz, MD;  Location: [facility] ENDOSCOPY;  Service: Endoscopy;  Laterality: N/A;  . PACEMAKER INSERTION  1994; 2002; 05/08/2012  . TONSILLECTOMY  ~ 1959  . TUBAL LIGATION  1984?    Current Outpatient Medications  Medication Sig Dispense Refill  . allopurinol (ZYLOPRIM) 100 MG tablet TAKE 1/2 TABLET (50 MG TOTAL) BY MOUTH DAILY. (Patient taking differently: Take 50 mg by mouth daily. ) 15 tablet 1  . ALPRAZolam (XANAX) 0.25 MG tablet TAKE 1 TABLET (0.25 MG TOTAL) BY MOUTH 2 (TWO) TIMES DAILY AS NEEDED FOR ANXIETY. 30 tablet 1  . AMBULATORY NON FORMULARY MEDICATION Medication Name: 100% O2 15 leters a min for 15-20 mins  Via Nasal cannual (Patient taking differently: as needed (migraine). Medication Name: 100% O2 15 leters a min for 15-20 mins  Via Nasal cannual) 1 Bottle 2  . amitriptyline (ELAVIL) 10 MG tablet Take 1 tablet (10 mg total) by mouth at bedtime. (Patient taking differently: Take 10 mg by mouth as needed (migraines). ) 30 tablet 3  . aspirin 81 MG chewable tablet Chew 81 mg by mouth daily.    . bisoprolol (ZEBETA) 5 MG tablet Take 5 mg by mouth. Only taking if blood pressure is over 100    . budesonide-formoterol (SYMBICORT) 160-4.5 MCG/ACT inhaler Inhale 2 puffs into the lungs 2 (two) times daily.  1 Inhaler 5  . colchicine 0.6 MG tablet Take 1 tablet (0.6 mg total) by mouth 2 (two) times daily as needed (for gout flare ups.). (Patient taking differently: Take 0.6 mg by mouth as needed (for gout flare ups.). ) 60 tablet 1  . dofetilide (TIKOSYN) 500 MCG capsule Take 1 capsule (500 mcg total) by mouth 2 (two) times daily. 60 capsule 0  . ELIQUIS 5 MG TABS tablet TAKE 1 TABLET BY MOUTH TWICE DAILY (Patient taking differently: Take 5 mg by mouth 2 (two) times daily. ) 60 tablet 5  .  FLUoxetine (PROZAC) 20 MG capsule TAKE 1 CAPSULE (20 MG TOTAL) BY MOUTH DAILY. 30 capsule 3  . fluticasone (FLONASE) 50 MCG/ACT nasal spray PLACE 2 SPRAYS INTO BOTH NOSTRILS DAILY. (Patient taking differently: Place 2 sprays into both nostrils daily as needed for allergies. ) 16 g 5  . guaiFENesin (MUCINEX) 600 MG 12 hr tablet Take 600 mg by mouth. As needed    . ipratropium-albuterol (DUONEB) 0.5-2.5 (3) MG/3ML SOLN USE 1 VIAL (3ML) BY NEBULIZER EVERY 4 TO 6 HOURS AS NEEDED 270 mL 2  . losartan (COZAAR) 25 MG tablet Take 1 tablet (25 mg total) by mouth daily. (Patient taking differently: Take 25 mg by mouth daily. Only taking if blood pressure is over 100) 30 tablet 11  . montelukast (SINGULAIR) 10 MG tablet Take 1 tablet (10 mg total) by mouth at bedtime. 30 tablet 5  . potassium chloride SA (K-DUR,KLOR-CON) 20 MEQ tablet TAKE 1 TABLET (20 MEQ TOTAL) BY MOUTH DAILY. 90 tablet 1  . Spacer/Aero-Holding Chambers (AEROCHAMBER MV) inhaler Use as instructed with Symbicort 1 each 0  . SUMAtriptan (IMITREX) 50 MG tablet TAKE 1 TABLET AT ONSET OF HEADACHE. MAY REPEAT IN 2 HOURS IF HEADACHE PERSISTS OR RECURS. (Patient taking differently: Take 50 mg by mouth every 2 (two) hours as needed for migraine. ) 10 tablet 6  . tiotropium (SPIRIVA) 18 MCG inhalation capsule Place 18 mcg into inhaler and inhale daily.    Marland Kitchen torsemide (DEMADEX) 20 MG tablet TAKE 1 TABLET BY MOUTH TWICE DAILY. **NEED APPOINTMENT FOR FURTHER REFILLS** 60 tablet 5  . VENTOLIN HFA 108 (90 Base) MCG/ACT inhaler INHALE 1-2 PUFFS INTO THE LUNGS EVERY 6 (SIX) HOURS AS NEEDED FOR WHEEZING OR SHORTNESS OF BREATH. (Patient taking differently: Inhale 2 puffs into the lungs every 6 (six) hours as needed for wheezing or shortness of breath. ) 1 Inhaler 5  . nicotine (NICODERM CQ - DOSED IN MG/24 HOURS) 21 mg/24hr patch Place 1 patch (21 mg total) onto the skin daily. (Patient not taking: Reported on 12/23/2018) 28 patch 0   No current  facility-administered medications for this encounter.    Allergies  Allergen Reactions  . Prednisone Shortness Of Breath    CHF     Social History   Socioeconomic History  . Marital status: Widowed    Spouse name: Not on file  . Number of children: 2  . Years of education: Not on file  . Highest education level: Not on file  Occupational History  . Occupation: [employer]    Employer: [employer]  Tobacco Use  . Smoking status: Former Smoker    Packs/day: 0.50    Years: 30.00    Pack years: 15.00    Types: Cigarettes    Quit date: 07/12/2018    Years since quitting: 0.5  . Smokeless tobacco: Never Used  Substance and Sexual Activity  . Alcohol use: Yes  Alcohol/week: 2.0 standard drinks    Types: 2 Glasses of wine per week  . Drug use: No  . Sexual activity: Never  Other Topics Concern  . Not on file  Social History Narrative   ** Merged History Encounter **       Social Determinants of Health   Financial Resource Strain:   . Difficulty of Paying Living Expenses: Not on file  Food Insecurity:   . Worried About Charity fundraiser in the Last Year: Not on file  . Ran Out of Food in the Last Year: Not on file  Transportation Needs:   . Lack of Transportation (Medical): Not on file  . Lack of Transportation (Non-Medical): Not on file  Physical Activity:   . Days of Exercise per Week: Not on file  . Minutes of Exercise per Session: Not on file  Stress:   . Feeling of Stress : Not on file  Social Connections:   . Frequency of Communication with Friends and Family: Not on file  . Frequency of Social Gatherings with Friends and Family: Not on file  . Attends Religious Services: Not on file  . Active Member of Clubs or Organizations: Not on file  . Attends Archivist Meetings: Not on file  . Marital Status: Not on file  Intimate Partner Violence:   . Fear of Current or Ex-Partner: Not on file  . Emotionally Abused: Not on file  .  Physically Abused: Not on file  . Sexually Abused: Not on file    Family History  Problem Relation Age of Onset  . Heart disease Sister   . Dementia Mother   . Cancer Sister 67       uterine  . Diabetes Other   . Cardiomyopathy Sister   . Diabetes Brother   . Colon cancer Neg Hx   . Esophageal cancer Neg Hx   . Stomach cancer Neg Hx   . Rectal cancer Neg Hx     ROS- All systems are reviewed and negative except as per the HPI above  Physical Exam: Vitals:   01/26/19 1536  BP: 110/72  Pulse: 72  Weight: 56.2 kg  Height: 5\' 2"  (1.575 m)   Wt Readings from Last 3 Encounters:  01/26/19 56.2 kg  12/26/18 54.4 kg  11/17/18 54.5 kg    Labs: Lab Results  Component Value Date   NA 139 12/23/2018   K 3.9 12/23/2018   CL 102 12/23/2018   CO2 26 12/23/2018   GLUCOSE 121 (H) 12/23/2018   BUN 13 12/23/2018   CREATININE 1.09 (H) 12/23/2018   CALCIUM 8.4 (L) 12/23/2018   PHOS 2.0 (L) 06/24/2017   MG 2.2 06/24/2017   Lab Results  Component Value Date   INR 1.06 09/18/2017   Lab Results  Component Value Date   CHOL 193 07/15/2018   HDL 60 07/15/2018   LDLCALC 118 (H) 07/15/2018   TRIG 63 07/15/2018     GEN- The patient is well appearing, alert and oriented x 3 today.   Head- normocephalic, atraumatic Eyes-  Sclera clear, conjunctiva pink Ears- hearing intact Oropharynx- clear Neck- supple, no JVP Lymph- no cervical lymphadenopathy Lungs- Clear to ausculation bilaterally, normal work of breathing Heart- Regular rate and rhythm, no murmurs, rubs or gallops, PMI not laterally displaced GI- soft, NT, ND, + BS Extremities- no clubbing, cyanosis, or edema MS- no significant deformity or atrophy Skin- no rash or lesion Psych- euthymic mood, full affect Neuro- strength and sensation  are intact  EKG- atrial sensed, v paced at 72 bpm  Epic records reviewed     Assessment and Plan: 1. Paroxysmal afib  S/p ablation x one month In rhythm  Continue Tikosyn 500 mg  bid and bisoprolol at current doses  Bmet/mag  2. CHA2DS2VASc score of at least 3 Continue  eliquis Reminded not to stop drug for elective reasons in the next 2 months  F/u with Dr. Curt Bears 4/5  Geroge Baseman. Kelita Wallis, [facility] [address] [place], [place] [address] [phone]

## 2019-01-27 ENCOUNTER — Other Ambulatory Visit (HOSPITAL_COMMUNITY): Payer: Self-pay | Admitting: *Deleted

## 2019-01-27 DIAGNOSIS — I4819 Other persistent atrial fibrillation: Secondary | ICD-10-CM

## 2019-01-27 MED ORDER — POTASSIUM CHLORIDE CRYS ER 20 MEQ PO TBCR: 20.0000 meq | EXTENDED_RELEASE_TABLET | Freq: Two times a day (BID) | ORAL | 1 refills | Status: DC

## 2019-01-27 MED FILL — POTASSIUM CHLORIDE CRYS ER: 20 | 90 days supply | Qty: 180 | Fill #0

## 2019-02-04 ENCOUNTER — Encounter (HOSPITAL_COMMUNITY): Payer: Self-pay | Admitting: Nurse Practitioner

## 2019-02-04 NOTE — Addendum Note (Signed)
Encounter addended by: Sherran Needs, NP on: 02/04/2019 10:17 AM  Actions taken: Allergies reviewed, Medication List reviewed, Problem List reviewed, Level of Service modified

## 2019-02-09 ENCOUNTER — Other Ambulatory Visit: Payer: Self-pay | Admitting: Family Medicine

## 2019-02-09 MED FILL — SYMBICORT 160-4.5 MCG INH: 160-4.5 | 30 days supply | Qty: 10 | Fill #1

## 2019-02-09 MED FILL — FLUoxetine HCL 20 MG CAPS: 20 | 30 days supply | Qty: 30 | Fill #2

## 2019-02-09 NOTE — Telephone Encounter (Signed)
Please ask pt if she is taking 2 tabs every day b/c that seems to be the way that she is filling the medication (rather than PRN)

## 2019-02-09 NOTE — Telephone Encounter (Signed)
Called Pt and LMOVM to verify how pt is taking medication.

## 2019-02-09 NOTE — Telephone Encounter (Signed)
Last OV 07/15/18 Alprazolam last filled 01/06/19 #30 with 1

## 2019-02-10 ENCOUNTER — Encounter: Payer: Self-pay | Admitting: General Practice

## 2019-02-10 MED FILL — ALPRAZolam 0.25 MG TABS: 0.25 | 15 days supply | Qty: 30 | Fill #0

## 2019-02-10 NOTE — Telephone Encounter (Signed)
I received a message from Angela Shepherd that pt returned your call at 0738am today. She is available until 1pm.

## 2019-02-10 NOTE — Telephone Encounter (Signed)
Also sent pt a mychart message to advise.

## 2019-02-10 NOTE — Telephone Encounter (Signed)
Called and LMOVM.  

## 2019-02-10 NOTE — Telephone Encounter (Signed)
Spoke with pt and she advised that she is pretty much taking two tablets by mouth every day.

## 2019-02-23 ENCOUNTER — Ambulatory Visit (INDEPENDENT_AMBULATORY_CARE_PROVIDER_SITE_OTHER): Payer: 59 | Admitting: *Deleted

## 2019-02-23 DIAGNOSIS — I4819 Other persistent atrial fibrillation: Secondary | ICD-10-CM

## 2019-02-23 LAB — CUP PACEART REMOTE DEVICE CHECK
Battery Remaining Longevity: 19 mo
Battery Remaining Percentage: 25 %
Battery Voltage: 2.8 V
Brady Statistic AP VP Percent: 19 %
Brady Statistic AP VS Percent: 1 %
Brady Statistic AS VP Percent: 80 %
Brady Statistic AS VS Percent: 1 %
Brady Statistic RA Percent Paced: 18 %
Date Time Interrogation Session: 20210215131705
Implantable Lead Implant Date: 19940705
Implantable Lead Implant Date: 19940705
Implantable Lead Implant Date: 20140501
Implantable Lead Location: 753858
Implantable Lead Location: 753859
Implantable Lead Location: 753860
Implantable Lead Model: 4024
Implantable Lead Model: 4524
Implantable Pulse Generator Implant Date: 20140501
Lead Channel Impedance Value: 350 Ohm
Lead Channel Impedance Value: 380 Ohm
Lead Channel Impedance Value: 580 Ohm
Lead Channel Pacing Threshold Amplitude: 0.75 V
Lead Channel Pacing Threshold Amplitude: 1.25 V
Lead Channel Pacing Threshold Amplitude: 2 V
Lead Channel Pacing Threshold Pulse Width: 0.4 ms
Lead Channel Pacing Threshold Pulse Width: 0.4 ms
Lead Channel Pacing Threshold Pulse Width: 1 ms
Lead Channel Sensing Intrinsic Amplitude: 1.1 mV
Lead Channel Sensing Intrinsic Amplitude: 4.9 mV
Lead Channel Setting Pacing Amplitude: 2 V
Lead Channel Setting Pacing Amplitude: 2.25 V
Lead Channel Setting Pacing Amplitude: 2.5 V
Lead Channel Setting Pacing Pulse Width: 0.4 ms
Lead Channel Setting Pacing Pulse Width: 1 ms
Lead Channel Setting Sensing Sensitivity: 0.7 mV
Pulse Gen Model: 3210
Pulse Gen Serial Number: 2936617

## 2019-02-24 NOTE — Progress Notes (Signed)
PPM Remote  

## 2019-02-25 MED FILL — ALPRAZolam 0.25 MG TABS: 0.25 | 15 days supply | Qty: 30 | Fill #1

## 2019-02-25 MED FILL — MONTELUKAST SOD 10 MG TAB: 10 | 60 days supply | Qty: 60 | Fill #2

## 2019-03-02 ENCOUNTER — Other Ambulatory Visit: Payer: Self-pay | Admitting: Emergency Medicine

## 2019-03-02 ENCOUNTER — Other Ambulatory Visit (HOSPITAL_COMMUNITY): Payer: Self-pay | Admitting: Internal Medicine

## 2019-03-02 MED FILL — IPRAT-ALBUT 0.5-3(2.5) MG/3: 0.5-2.5 (3) | 15 days supply | Qty: 270 | Fill #1

## 2019-03-02 MED FILL — ELIQUIS 5 MG TABLET: 5 | 30 days supply | Qty: 60 | Fill #0

## 2019-03-02 MED FILL — SPIRIVA RESPIMAT 2.5 MCG IN: 2.5 | 30 days supply | Qty: 4 | Fill #5

## 2019-03-02 MED FILL — ALBUTEROL SULFATE HFA 108 (: 108 (90 BAS | 25 days supply | Qty: 18 | Fill #0

## 2019-03-06 MED FILL — SYMBICORT 160-4.5 MCG INH: 160-4.5 | 30 days supply | Qty: 10 | Fill #2

## 2019-03-10 MED FILL — FLUoxetine HCL 20 MG CAPS: 20 | 30 days supply | Qty: 30 | Fill #3

## 2019-03-20 IMAGING — MG DIGITAL SCREENING BILATERAL MAMMOGRAM WITH TOMO AND CAD
9 series · 9 of 29 positions shown · non-contrast
Comparison: Previous exam(s).

CLINICAL DATA: Screening.

EXAM:
DIGITAL SCREENING BILATERAL MAMMOGRAM WITH TOMO AND CAD

[R MLO synth-2D (1 of 2)]
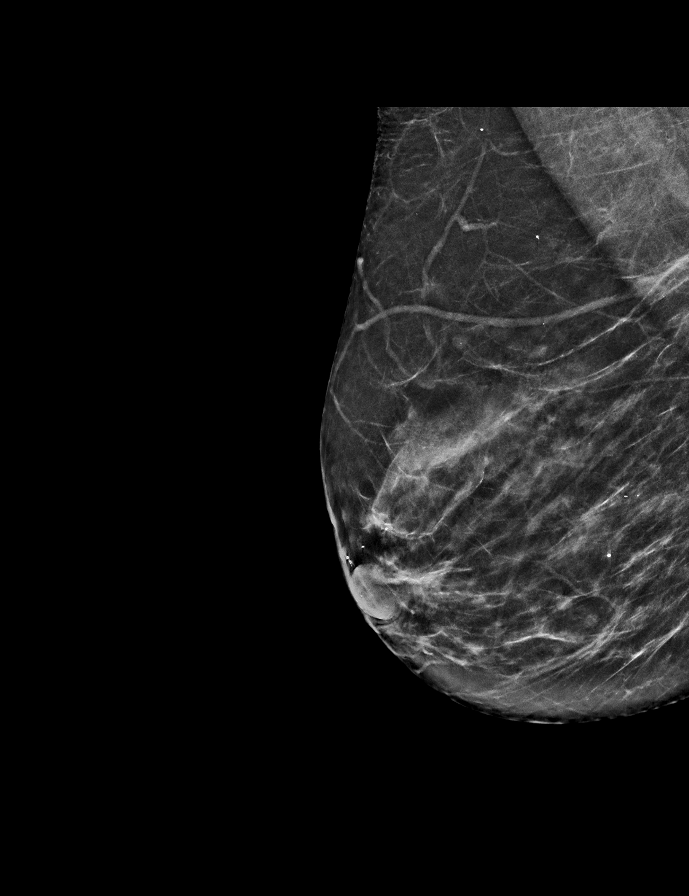

[R MLO synth-2D (2 of 2)]
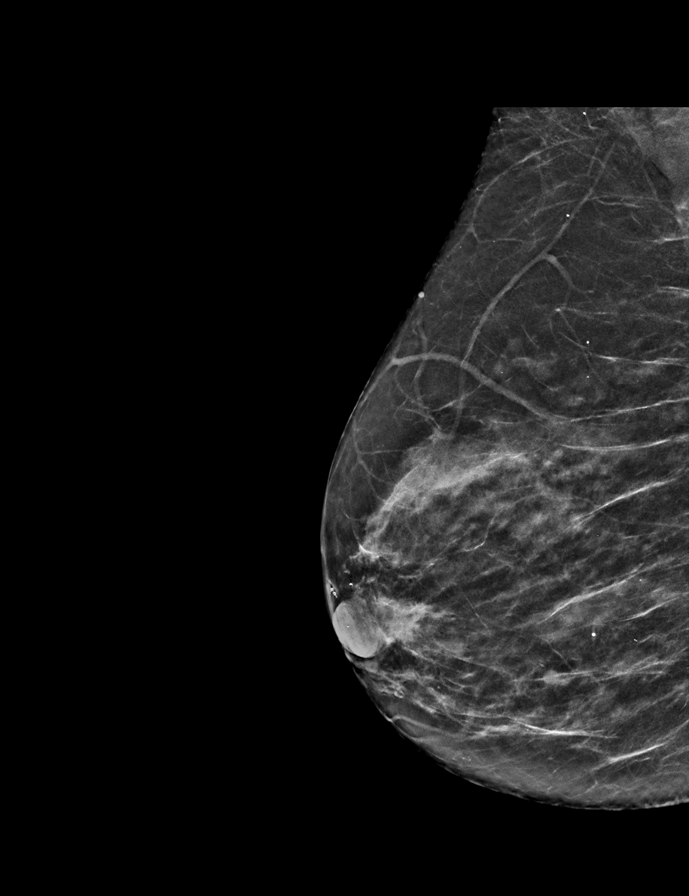

[L MLO synth-2D]
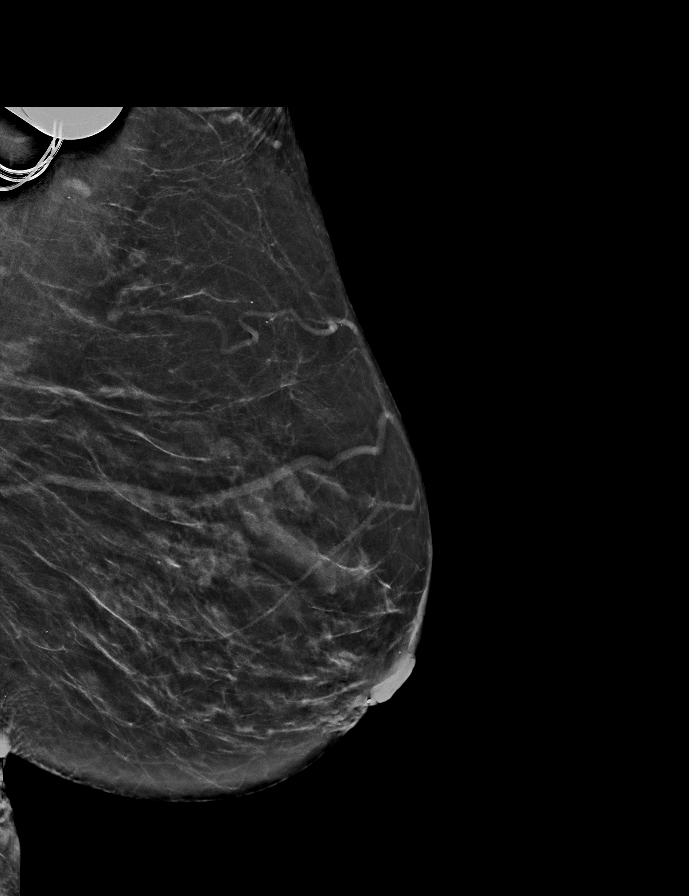

[L CC synth-2D]
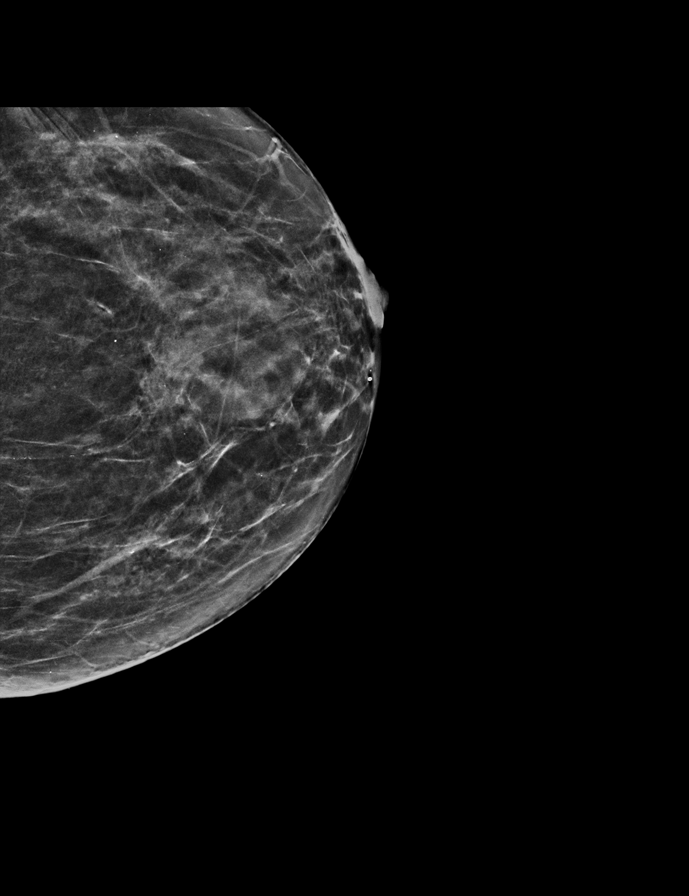

[R MLO tomo (1 of 2) · tomo slice 28/55.0]
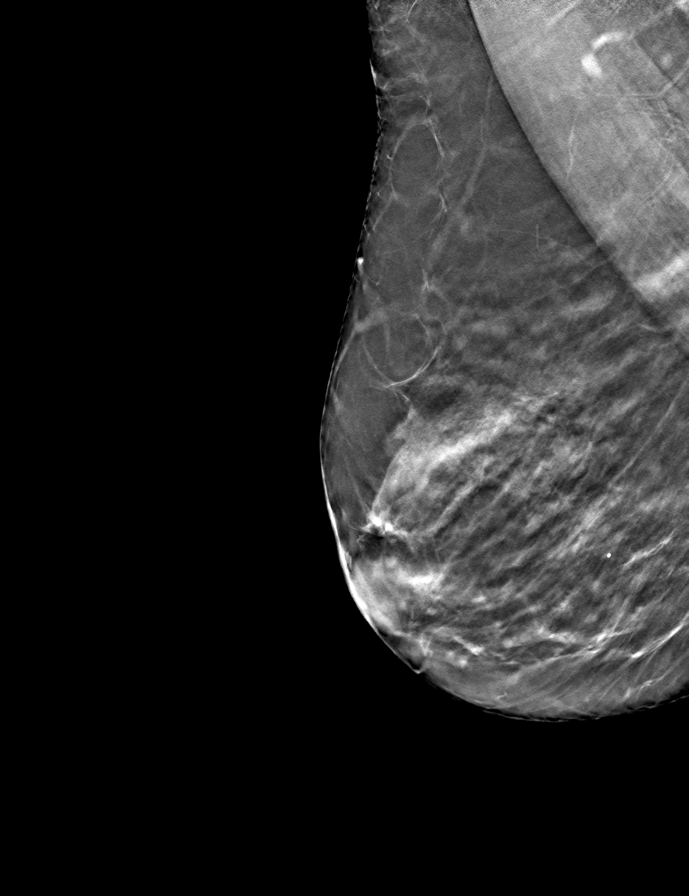

[L MLO tomo (1 of 2) · tomo slice 29/57.0]
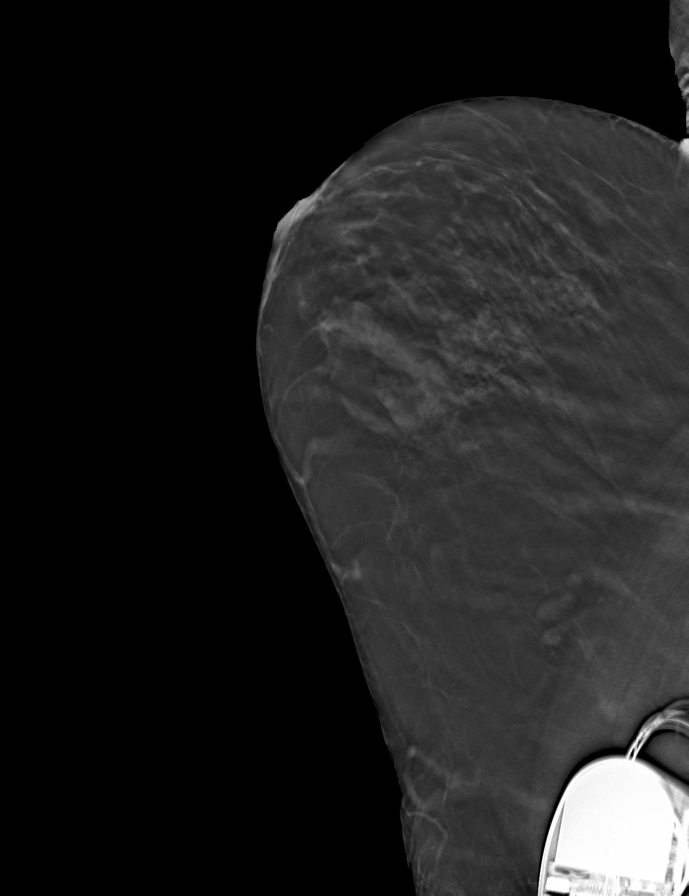

[L MLO tomo (2 of 2) · tomo slice 27/54.0]
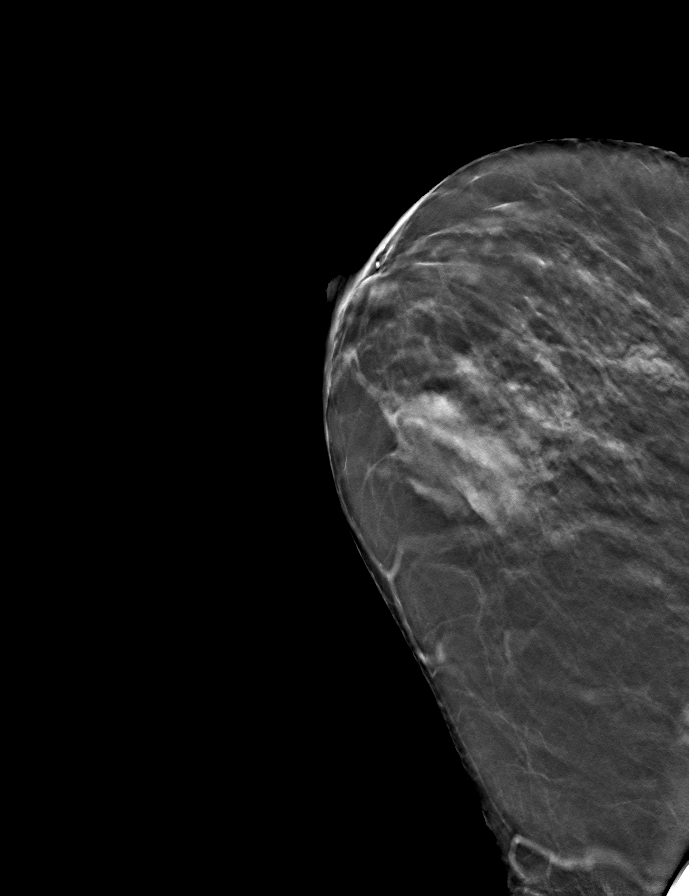

[L CC tomo · tomo slice 25/50.0]
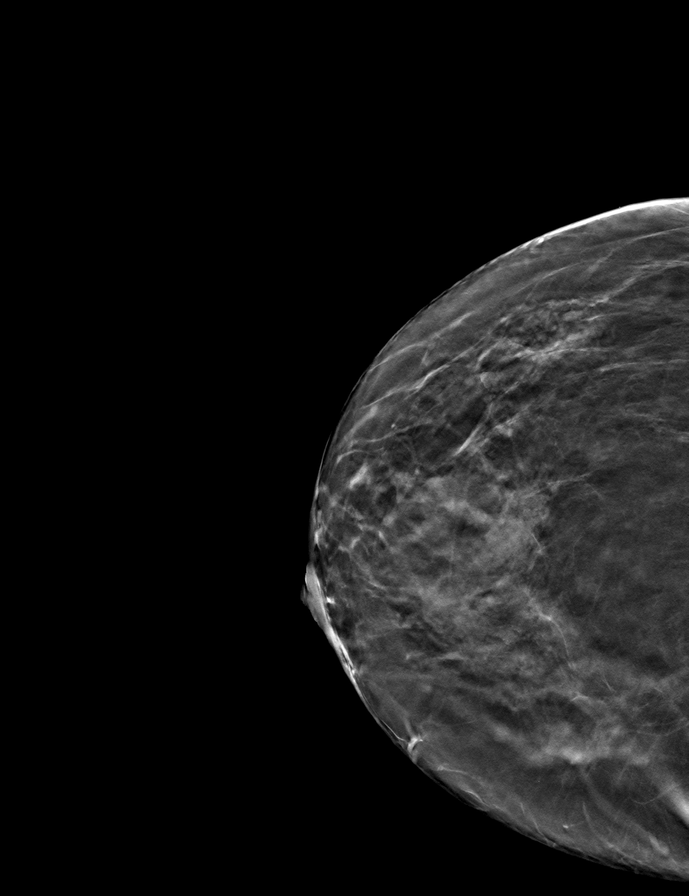

[R MLO tomo (2 of 2) · tomo slice 26/51.0]
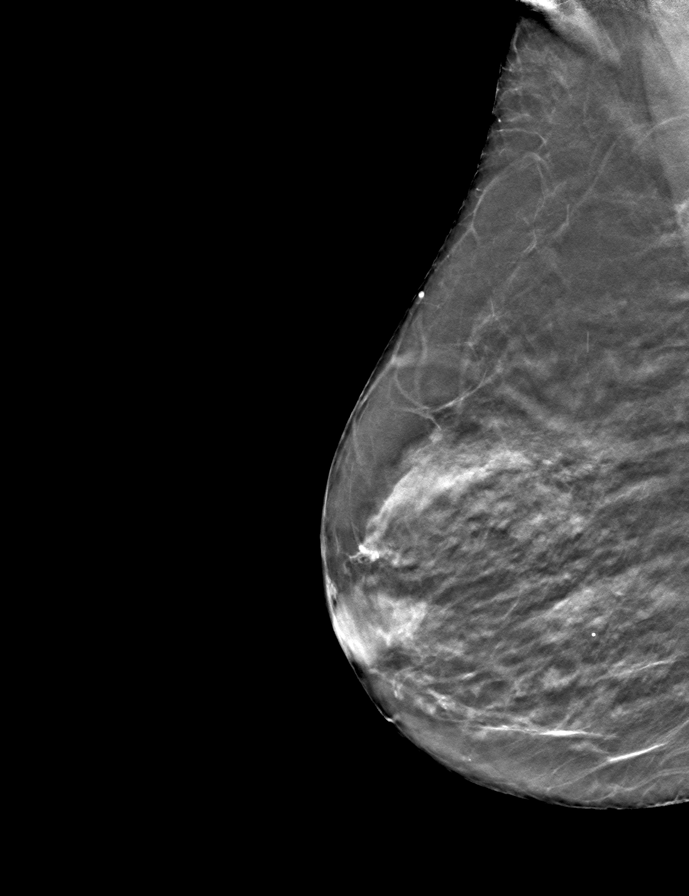

[9 of 29 positions shown; findings below may reference images not displayed]

ACR Breast Density Category c: The breast tissue is heterogeneously
dense, which may obscure small masses.
FINDINGS: There are no findings suspicious for malignancy. Images were
processed with CAD.
IMPRESSION: No mammographic evidence of malignancy. A result letter of this
screening mammogram will be mailed directly to the patient.

RECOMMENDATION:
Screening mammogram in one year. (Code:[id number]-[id number])

BI-RADS CATEGORY  1: Negative.

## 2019-03-23 ENCOUNTER — Other Ambulatory Visit: Payer: Self-pay | Admitting: Family Medicine

## 2019-03-23 MED FILL — ALPRAZolam 0.25 MG TABS: 0.25 | 15 days supply | Qty: 30 | Fill #0

## 2019-03-23 NOTE — Telephone Encounter (Signed)
Last OV 07/15/18 Alprazolam last filled 02/10/19 #30 with 1

## 2019-04-07 ENCOUNTER — Other Ambulatory Visit: Payer: Self-pay | Admitting: Family Medicine

## 2019-04-07 MED FILL — TORSEMIDE 20 MG TABLET: 20 | 90 days supply | Qty: 180 | Fill #1

## 2019-04-07 MED FILL — ALPRAZolam 0.25 MG TABS: 0.25 | 15 days supply | Qty: 30 | Fill #1

## 2019-04-07 MED FILL — FLUoxetine HCL 20 MG CAPS: 20 | 90 days supply | Qty: 90 | Fill #0

## 2019-04-13 ENCOUNTER — Other Ambulatory Visit: Payer: Self-pay

## 2019-04-13 ENCOUNTER — Ambulatory Visit (INDEPENDENT_AMBULATORY_CARE_PROVIDER_SITE_OTHER): Payer: 59 | Admitting: Cardiology

## 2019-04-13 ENCOUNTER — Encounter: Payer: Self-pay | Admitting: Cardiology

## 2019-04-13 VITALS — BP 96/62 | HR 64 | Ht 62.0 in | Wt 122.1 lb

## 2019-04-13 DIAGNOSIS — I5022 Chronic systolic (congestive) heart failure: Secondary | ICD-10-CM

## 2019-04-13 DIAGNOSIS — I4819 Other persistent atrial fibrillation: Secondary | ICD-10-CM | POA: Diagnosis not present

## 2019-04-13 DIAGNOSIS — Z950 Presence of cardiac pacemaker: Secondary | ICD-10-CM | POA: Diagnosis not present

## 2019-04-13 LAB — CUP PACEART INCLINIC DEVICE CHECK
Battery Remaining Longevity: 16 mo
Battery Voltage: 2.78 V
Brady Statistic RA Percent Paced: 23 %
Brady Statistic RV Percent Paced: 98 %
Date Time Interrogation Session: 20210405174644
Implantable Lead Implant Date: 19940705
Implantable Lead Implant Date: 19940705
Implantable Lead Implant Date: 20140501
Implantable Lead Location: 753858
Implantable Lead Location: 753859
Implantable Lead Location: 753860
Implantable Lead Model: 4024
Implantable Lead Model: 4524
Implantable Pulse Generator Implant Date: 20140501
Lead Channel Impedance Value: 362.5 Ohm
Lead Channel Impedance Value: 412.5 Ohm
Lead Channel Impedance Value: 587.5 Ohm
Lead Channel Pacing Threshold Amplitude: 1 V
Lead Channel Pacing Threshold Amplitude: 1 V
Lead Channel Pacing Threshold Amplitude: 1.25 V
Lead Channel Pacing Threshold Pulse Width: 0.4 ms
Lead Channel Pacing Threshold Pulse Width: 0.4 ms
Lead Channel Pacing Threshold Pulse Width: 1 ms
Lead Channel Sensing Intrinsic Amplitude: 2 mV
Lead Channel Sensing Intrinsic Amplitude: 4 mV
Lead Channel Setting Pacing Amplitude: 2 V
Lead Channel Setting Pacing Amplitude: 2.5 V
Lead Channel Setting Pacing Amplitude: 2.5 V
Lead Channel Setting Pacing Pulse Width: 0.4 ms
Lead Channel Setting Pacing Pulse Width: 1 ms
Lead Channel Setting Sensing Sensitivity: 2 mV
Pulse Gen Model: 3210
Pulse Gen Serial Number: 2936617

## 2019-04-13 MED FILL — SYMBICORT 160-4.5 MCG INH: 160-4.5 | 30 days supply | Qty: 10 | Fill #3

## 2019-04-13 NOTE — Progress Notes (Signed)
Electrophysiology Office Note   Date:  04/13/2019   ID:  Angela Shepherd, Angela Shepherd Jun 01, 1950, MRN [id number]  PCP:  Midge Minium, MD  Cardiologist:  [place] Primary Electrophysiologist:  Constance Haw, MD    No chief complaint on file.    History of Present Illness: Angela Shepherd is a 69 y.o. female who is being seen today for the evaluation of HOCM at the request of Tabori, Aundra Millet, MD. Presenting today for electrophysiology evaluation.  History of hypertrophic cardiomyopathy, bradycardia status post pacemaker in the 1990s at [facility], hypertension, tobacco abuse who quit in 2012.  She was admitted December 2011 with acute heart failure was found to have an ejection fraction of 30 to 35%.  Catheterization showed normal coronary arteries with an EF of 40 to 45%.  She was seen by EP and was found to have a high burden of RV pacing and was felt to be causing her LV dysfunction.  She underwent upgrade to a [employer] CRT-P 05/2012.  Echo in 2016 showed an EF of 45 to 50%.  He had a cardioversion on 09/19/2017, but unfortunately quickly went back into atrial fibrillation.  Today, denies symptoms of palpitations, chest pain, shortness of breath, orthopnea, PND, lower extremity edema, claudication, dizziness, presyncope, syncope, bleeding, or neurologic sequela. The patient is tolerating medications without difficulties.  Overall she is doing well.  She has had no further episodes of atrial fibrillation.  She currently feels well without major complaint today.   Past Medical History:  Diagnosis Date  . Bradycardia    a. initial PPM placed 1994 at [facility] with gen change 2002. b. 1*AVB & intermittent 2nd degree AVB + CHF --> upgrade to [facility]. Jude BiV PPM 05/08/12.  . Chronic systolic CHF (congestive heart failure) ([place])    a. EF 30-35% dx in 2011 -> most recent 40-45% by echo 04/2012. b. s/p upgrade to BiV-PPM 05/08/12. c. Med titration limited by hypotension.  .  Cluster headache syndrome   . Hypertr obst cardiomyop   . Hypokalemia   . Hypotension   . Paroxysmal atrial fibrillation (HCC)    a. identified on PPM interrogation b. longest episode 1 hour; if burden increases, Jaray Boliver need [place]   . Pneumonia 2008  . Sleep apnea    Past Surgical History:  Procedure Laterality Date  . ATRIAL FIBRILLATION ABLATION N/A 11/15/2017   Procedure: ATRIAL FIBRILLATION ABLATION;  Surgeon: Constance Haw, MD;  Location: [place] [facility];  Service: Cardiovascular;  Laterality: N/A;  . ATRIAL FIBRILLATION ABLATION N/A 12/26/2018   Procedure: ATRIAL FIBRILLATION ABLATION;  Surgeon: Constance Haw, MD;  Location: [place] CV LAB;  Service: Cardiovascular;  Laterality: N/A;  . BI-VENTRICULAR PACEMAKER UPGRADE N/A 05/08/2012   upgrade to SJM Anthem (CRT-P) by Dr Rayann Heman  . CARDIOVERSION N/A 09/19/2017   Procedure: CARDIOVERSION;  Surgeon: Sanda Klein, MD;  Location: [place] ENDOSCOPY;  Service: Cardiovascular;  Laterality: N/A;  . CARDIOVERSION N/A 10/01/2018   Procedure: CARDIOVERSION;  Surgeon: Donato Heinz, MD;  Location: [facility] ENDOSCOPY;  Service: Endoscopy;  Laterality: N/A;  . PACEMAKER INSERTION  1994; 2002; 05/08/2012  . TONSILLECTOMY  ~ 1959  . TUBAL LIGATION  1984?     Current Outpatient Medications  Medication Sig Dispense Refill  . allopurinol (ZYLOPRIM) 100 MG tablet TAKE 1/2 TABLET (50 MG TOTAL) BY MOUTH DAILY. 15 tablet 1  . ALPRAZolam (XANAX) 0.25 MG tablet TAKE 1 TABLET BY MOUTH TWICE DAILY AS NEEDED FOR ANXIETY 30  tablet 1  . AMBULATORY NON FORMULARY MEDICATION Medication Name: 100% O2 15 leters a min for 15-20 mins  Via Nasal cannual 1 Bottle 2  . amitriptyline (ELAVIL) 10 MG tablet Take 1 tablet (10 mg total) by mouth at bedtime. 30 tablet 3  . aspirin 81 MG chewable tablet Chew 81 mg by mouth daily.    . bisoprolol (ZEBETA) 5 MG tablet Take 5 mg by mouth. Only taking if blood pressure is over 100    . budesonide-formoterol  (SYMBICORT) 160-4.5 MCG/ACT inhaler Inhale 2 puffs into the lungs 2 (two) times daily. 1 Inhaler 5  . colchicine 0.6 MG tablet Take 1 tablet (0.6 mg total) by mouth 2 (two) times daily as needed (for gout flare ups.). 60 tablet 1  . dofetilide (TIKOSYN) 500 MCG capsule Take 1 capsule (500 mcg total) by mouth 2 (two) times daily. 60 capsule 0  . ELIQUIS 5 MG TABS tablet TAKE 1 TABLET BY MOUTH TWICE DAILY 60 tablet 2  . FLUoxetine (PROZAC) 20 MG capsule TAKE 1 CAPSULE (20 MG TOTAL) BY MOUTH DAILY. 90 capsule 1  . fluticasone (FLONASE) 50 MCG/ACT nasal spray PLACE 2 SPRAYS INTO BOTH NOSTRILS DAILY. 16 g 5  . guaiFENesin (MUCINEX) 600 MG 12 hr tablet Take 600 mg by mouth. As needed    . ipratropium-albuterol (DUONEB) 0.5-2.5 (3) MG/3ML SOLN USE 1 VIAL (3ML) BY NEBULIZER EVERY 4 TO 6 HOURS AS NEEDED 270 mL 2  . losartan-hydrochlorothiazide (HYZAAR) 100-25 MG tablet Take 1 tablet by mouth as needed (for BP over 100).    . montelukast (SINGULAIR) 10 MG tablet Take 1 tablet (10 mg total) by mouth at bedtime. 30 tablet 5  . potassium chloride SA (KLOR-CON) 20 MEQ tablet Take 1 tablet (20 mEq total) by mouth 2 (two) times daily. 180 tablet 1  . Spacer/Aero-Holding Chambers (AEROCHAMBER MV) inhaler Use as instructed with Symbicort 1 each 0  . SUMAtriptan (IMITREX) 50 MG tablet TAKE 1 TABLET AT ONSET OF HEADACHE. MAY REPEAT IN 2 HOURS IF HEADACHE PERSISTS OR RECURS. 10 tablet 6  . tiotropium (SPIRIVA) 18 MCG inhalation capsule Place 18 mcg into inhaler and inhale daily.    Marland Kitchen torsemide (DEMADEX) 20 MG tablet TAKE 1 TABLET BY MOUTH TWICE DAILY. **NEED APPOINTMENT FOR FURTHER REFILLS** 60 tablet 5  . VENTOLIN HFA 108 (90 Base) MCG/ACT inhaler INHALE 1-2 PUFFS BY MOUTH INTO THE LUNGS EVERY 6 (SIX) HOURS AS NEEDED FOR WHEEZING OR SHORTNESS OF BREATH. 18 g 5   No current facility-administered medications for this visit.    Allergies:   Prednisone   Social History:  The patient  reports that she quit smoking  about 9 months ago. Her smoking use included cigarettes. She has a 15.00 pack-year smoking history. She has never used smokeless tobacco. She reports current alcohol use of about 2.0 standard drinks of alcohol per week. She reports that she does not use drugs.   Family History:  The patient's family history includes Cancer (age of onset: 38) in her sister; Cardiomyopathy in her sister; Dementia in her mother; Diabetes in her brother and another family member; Heart disease in her sister.    ROS:  Please see the history of present illness.   Otherwise, review of systems is positive for none.   All other systems are reviewed and negative.   PHYSICAL EXAM: VS:  BP 96/62   Pulse 64   Ht 5\' 2"  (1.575 m)   Wt 122 lb 1.9 oz (55.4 kg)  LMP  (LMP Unknown)   SpO2 95%   BMI 22.34 kg/m  , BMI Body mass index is 22.34 kg/m. GEN: Well nourished, well developed, in no acute distress  HEENT: normal  Neck: no JVD, carotid bruits, or masses Cardiac: RRR; no murmurs, rubs, or gallops,no edema  Respiratory:  clear to auscultation bilaterally, normal work of breathing GI: soft, nontender, nondistended, + BS MS: no deformity or atrophy  Skin: warm and dry, device site well healed Neuro:  Strength and sensation are intact Psych: euthymic mood, full affect  EKG:  EKG is ordered today. Personal review of the ekg ordered shows AV paced  Personal review of the device interrogation today. Results in [place]: 07/15/2018: ALT 16; TSH 1.46 12/23/2018: Hemoglobin 13.9; Platelets 162 01/26/2019: BUN 18; Creatinine, Ser 1.27; Magnesium 2.1; Potassium 3.6; Sodium 138    Lipid Panel     Component Value Date/Time   CHOL 193 07/15/2018 1439   TRIG 63 07/15/2018 1439   HDL 60 07/15/2018 1439   CHOLHDL 3.2 07/15/2018 1439   VLDL 8 06/21/2017 0143   LDLCALC 118 (H) 07/15/2018 1439   LDLDIRECT 149.0 06/02/2015 1351     Wt Readings from Last 3 Encounters:  04/13/19 122 lb 1.9 oz (55.4 kg)    01/26/19 123 lb 12.8 oz (56.2 kg)  12/26/18 120 lb (54.4 kg)    Ejaculatory dysfunction AN:  1.  Chronic systolic heart failure: Currently on optimal medical therapy for heart failure.  No changes.  This recent ejection fraction 35 to 40%.  Currently on optimal medical therapy per heart failure.  Status post [facility] Jude CRT-P.  Device functioning appropriately.  No changes.    2.  Hypertrophic cardiomyopathy: Stable on most recent echo.  Plan per primary cardiology.  3.  Hypertension: Currently well controlled  4.  Atrial fibrillation/atrial flutter: Status post repeat ablation 12/26/2018.  Currently on Eliquis and Tikosyn.  CHA2DS2-VASc of 4.  Remains in sinus rhythm since repeat ablation.  Has had minimal atrial fibrillation.  No changes.   Current medicines are reviewed at length with the patient today.   The patient does not have concerns regarding her medicines.  The following changes were made today: None  Labs/ tests ordered today include:  Orders Placed This Encounter  Procedures  . EKG 12-Lead    Disposition:   FU with Syd Manges 6 month  Signed, Haskell Rihn Meredith Leeds, MD  04/13/2019 3:30 PM     [place] [address] [phone] (office) [phone] (fax)

## 2019-04-13 NOTE — Patient Instructions (Signed)
Medication Instructions:  Your physician recommends that you continue on your current medications as directed. Please refer to the Current Medication list given to you today.  *If you need a refill on your cardiac medications before your next appointment, please call your pharmacy*   Lab Work: None ordered  Testing/Procedures: None ordered   Follow-Up: Remote monitoring is used to monitor your Pacemaker of ICD from home. This monitoring reduces the number of office visits required to check your device to one time per year. It allows [place] to keep an eye on the functioning of your device to ensure it is working properly. You are scheduled for a device check from home on 05/25/2019. You may send your transmission at any time that day. If you have a wireless device, the transmission will be sent automatically. After your physician reviews your transmission, you will receive a postcard with your next transmission date.  At [facility], you and your health needs are our priority.  As part of our continuing mission to provide you with exceptional heart care, we have created designated Provider Care Teams.  These Care Teams include your primary Cardiologist (physician) and Advanced Practice Providers (APPs -  Physician Assistants and Nurse Practitioners) who all work together to provide you with the care you need, when you need it.  We recommend signing up for the patient portal called "MyChart".  Sign up information is provided on this After Visit Summary.  MyChart is used to connect with patients for Virtual Visits (Telemedicine).  Patients are able to view lab/test results, encounter notes, upcoming appointments, etc.  Non-urgent messages can be sent to your provider as well.   To learn more about what you can do with MyChart, go to [email or web address].    Your next appointment:   6 month(s)  The format for your next appointment:   In Person  Provider:   Allegra Lai, MD   Thank you for  choosing [facility]!!   Trinidad Curet, RN [phone]    Other Instructions

## 2019-04-22 MED FILL — IPRAT-ALBUT 0.5-3(2.5) MG/3: 0.5-2.5 (3) | 15 days supply | Qty: 270 | Fill #2

## 2019-04-27 ENCOUNTER — Other Ambulatory Visit: Payer: Self-pay | Admitting: Family Medicine

## 2019-04-27 MED FILL — ALPRAZolam 0.25 MG TABS: 0.25 | 15 days supply | Qty: 30 | Fill #0

## 2019-04-27 NOTE — Telephone Encounter (Signed)
Last OV 07/15/18 Alprazolam last filled 03/23/19 #30 with 1

## 2019-05-06 ENCOUNTER — Other Ambulatory Visit: Payer: Self-pay | Admitting: Adult Health

## 2019-05-06 ENCOUNTER — Other Ambulatory Visit: Payer: Self-pay | Admitting: Pulmonary Disease

## 2019-05-06 MED FILL — MONTELUKAST SOD 10 MG TAB: 10 | 60 days supply | Qty: 60 | Fill #0

## 2019-05-11 ENCOUNTER — Other Ambulatory Visit: Payer: Self-pay

## 2019-05-11 ENCOUNTER — Ambulatory Visit (HOSPITAL_BASED_OUTPATIENT_CLINIC_OR_DEPARTMENT_OTHER)
Admission: RE | Admit: 2019-05-11 | Discharge: 2019-05-11 | Disposition: A | Discharge status: Home / Self Care | Payer: 59 | Source: Ambulatory Visit | Attending: Pulmonary Disease | Admitting: Pulmonary Disease

## 2019-05-11 DIAGNOSIS — R918 Other nonspecific abnormal finding of lung field: Secondary | ICD-10-CM | POA: Diagnosis not present

## 2019-05-11 DIAGNOSIS — R0602 Shortness of breath: Secondary | ICD-10-CM | POA: Diagnosis not present

## 2019-05-12 ENCOUNTER — Telehealth: Payer: Self-pay | Admitting: Pulmonary Disease

## 2019-05-12 MED FILL — ALPRAZolam 0.25 MG TABS: 0.25 | 15 days supply | Qty: 30 | Fill #1

## 2019-05-12 MED FILL — SPIRIVA RESPIMAT 2.5 MCG IN: 2.5 | 30 days supply | Qty: 4 | Fill #6

## 2019-05-12 MED FILL — SYMBICORT 160-4.5 MCG INH: 160-4.5 | 30 days supply | Qty: 10 | Fill #4

## 2019-05-12 MED FILL — ALBUTEROL SULFATE HFA 108 (: 108 (90 BAS | 25 days supply | Qty: 18 | Fill #1

## 2019-05-12 NOTE — Telephone Encounter (Signed)
CT chest 05/11/19 >> Rt sided pleural thickening and calcification, moderate centrilobular emphysema, rounded ATX on Rt, posterior RUL nodule no longer indentified, 3 mm nodule RUL no change   Please schedule ROV with me or NP to review CT chest results.

## 2019-05-14 NOTE — Telephone Encounter (Signed)
ATC patient, she did not answer and her VM is full. Will attempt to call back later today.

## 2019-05-16 DIAGNOSIS — E872 Acidosis: Secondary | ICD-10-CM | POA: Diagnosis not present

## 2019-05-16 DIAGNOSIS — I5021 Acute systolic (congestive) heart failure: Secondary | ICD-10-CM | POA: Diagnosis not present

## 2019-05-16 DIAGNOSIS — I5023 Acute on chronic systolic (congestive) heart failure: Secondary | ICD-10-CM | POA: Diagnosis not present

## 2019-05-16 DIAGNOSIS — J90 Pleural effusion, not elsewhere classified: Secondary | ICD-10-CM | POA: Diagnosis not present

## 2019-05-16 DIAGNOSIS — Z950 Presence of cardiac pacemaker: Secondary | ICD-10-CM | POA: Diagnosis not present

## 2019-05-16 DIAGNOSIS — J9601 Acute respiratory failure with hypoxia: Secondary | ICD-10-CM | POA: Diagnosis not present

## 2019-05-16 DIAGNOSIS — I5089 Other heart failure: Secondary | ICD-10-CM | POA: Diagnosis not present

## 2019-05-16 DIAGNOSIS — J9811 Atelectasis: Secondary | ICD-10-CM | POA: Diagnosis not present

## 2019-05-16 DIAGNOSIS — I10 Essential (primary) hypertension: Secondary | ICD-10-CM | POA: Diagnosis not present

## 2019-05-16 DIAGNOSIS — I5020 Unspecified systolic (congestive) heart failure: Secondary | ICD-10-CM | POA: Diagnosis not present

## 2019-05-16 DIAGNOSIS — I480 Paroxysmal atrial fibrillation: Secondary | ICD-10-CM | POA: Diagnosis not present

## 2019-05-16 DIAGNOSIS — Z4682 Encounter for fitting and adjustment of non-vascular catheter: Secondary | ICD-10-CM | POA: Diagnosis not present

## 2019-05-16 DIAGNOSIS — J449 Chronic obstructive pulmonary disease, unspecified: Secondary | ICD-10-CM | POA: Diagnosis not present

## 2019-05-16 DIAGNOSIS — I428 Other cardiomyopathies: Secondary | ICD-10-CM | POA: Diagnosis not present

## 2019-05-16 DIAGNOSIS — J189 Pneumonia, unspecified organism: Secondary | ICD-10-CM | POA: Diagnosis not present

## 2019-05-16 DIAGNOSIS — E8770 Fluid overload, unspecified: Secondary | ICD-10-CM | POA: Diagnosis not present

## 2019-05-16 DIAGNOSIS — J432 Centrilobular emphysema: Secondary | ICD-10-CM | POA: Diagnosis not present

## 2019-05-16 DIAGNOSIS — R410 Disorientation, unspecified: Secondary | ICD-10-CM | POA: Diagnosis not present

## 2019-05-16 DIAGNOSIS — G4733 Obstructive sleep apnea (adult) (pediatric): Secondary | ICD-10-CM | POA: Diagnosis not present

## 2019-05-16 DIAGNOSIS — I4891 Unspecified atrial fibrillation: Secondary | ICD-10-CM | POA: Diagnosis not present

## 2019-05-16 DIAGNOSIS — J9690 Respiratory failure, unspecified, unspecified whether with hypoxia or hypercapnia: Secondary | ICD-10-CM | POA: Diagnosis not present

## 2019-05-16 DIAGNOSIS — Z978 Presence of other specified devices: Secondary | ICD-10-CM | POA: Diagnosis not present

## 2019-05-16 DIAGNOSIS — I110 Hypertensive heart disease with heart failure: Secondary | ICD-10-CM | POA: Diagnosis not present

## 2019-05-16 DIAGNOSIS — I5043 Acute on chronic combined systolic (congestive) and diastolic (congestive) heart failure: Secondary | ICD-10-CM | POA: Diagnosis not present

## 2019-05-16 DIAGNOSIS — N179 Acute kidney failure, unspecified: Secondary | ICD-10-CM | POA: Diagnosis not present

## 2019-05-16 DIAGNOSIS — I5041 Acute combined systolic (congestive) and diastolic (congestive) heart failure: Secondary | ICD-10-CM | POA: Diagnosis not present

## 2019-05-16 DIAGNOSIS — R4182 Altered mental status, unspecified: Secondary | ICD-10-CM | POA: Diagnosis not present

## 2019-05-16 DIAGNOSIS — E8779 Other fluid overload: Secondary | ICD-10-CM | POA: Diagnosis not present

## 2019-05-16 DIAGNOSIS — Z20822 Contact with and (suspected) exposure to covid-19: Secondary | ICD-10-CM | POA: Diagnosis not present

## 2019-05-16 DIAGNOSIS — N178 Other acute kidney failure: Secondary | ICD-10-CM | POA: Diagnosis not present

## 2019-05-16 DIAGNOSIS — R451 Restlessness and agitation: Secondary | ICD-10-CM | POA: Diagnosis not present

## 2019-05-16 DIAGNOSIS — R0989 Other specified symptoms and signs involving the circulatory and respiratory systems: Secondary | ICD-10-CM | POA: Diagnosis not present

## 2019-05-25 ENCOUNTER — Ambulatory Visit (HOSPITAL_COMMUNITY)
Admission: RE | Admit: 2019-05-25 | Discharge: 2019-05-25 | Disposition: A | Discharge status: Home / Self Care | Payer: 59 | Source: Ambulatory Visit | Attending: Internal Medicine | Admitting: Internal Medicine

## 2019-05-25 ENCOUNTER — Ambulatory Visit (INDEPENDENT_AMBULATORY_CARE_PROVIDER_SITE_OTHER): Payer: 59 | Admitting: *Deleted

## 2019-05-25 ENCOUNTER — Other Ambulatory Visit: Payer: Self-pay

## 2019-05-25 ENCOUNTER — Encounter (HOSPITAL_COMMUNITY): Payer: Self-pay | Admitting: Internal Medicine

## 2019-05-25 VITALS — BP 94/62 | HR 68 | Wt 124.0 lb

## 2019-05-25 DIAGNOSIS — Z950 Presence of cardiac pacemaker: Secondary | ICD-10-CM | POA: Insufficient documentation

## 2019-05-25 DIAGNOSIS — G4733 Obstructive sleep apnea (adult) (pediatric): Secondary | ICD-10-CM | POA: Diagnosis not present

## 2019-05-25 DIAGNOSIS — F17210 Nicotine dependence, cigarettes, uncomplicated: Secondary | ICD-10-CM | POA: Insufficient documentation

## 2019-05-25 DIAGNOSIS — I4819 Other persistent atrial fibrillation: Secondary | ICD-10-CM | POA: Diagnosis not present

## 2019-05-25 DIAGNOSIS — Z7951 Long term (current) use of inhaled steroids: Secondary | ICD-10-CM | POA: Diagnosis not present

## 2019-05-25 DIAGNOSIS — I5022 Chronic systolic (congestive) heart failure: Secondary | ICD-10-CM | POA: Diagnosis not present

## 2019-05-25 DIAGNOSIS — I5042 Chronic combined systolic (congestive) and diastolic (congestive) heart failure: Secondary | ICD-10-CM

## 2019-05-25 DIAGNOSIS — Z8249 Family history of ischemic heart disease and other diseases of the circulatory system: Secondary | ICD-10-CM | POA: Diagnosis not present

## 2019-05-25 DIAGNOSIS — I420 Dilated cardiomyopathy: Secondary | ICD-10-CM | POA: Diagnosis not present

## 2019-05-25 DIAGNOSIS — Z720 Tobacco use: Secondary | ICD-10-CM

## 2019-05-25 DIAGNOSIS — I110 Hypertensive heart disease with heart failure: Secondary | ICD-10-CM | POA: Diagnosis not present

## 2019-05-25 DIAGNOSIS — I421 Obstructive hypertrophic cardiomyopathy: Secondary | ICD-10-CM | POA: Insufficient documentation

## 2019-05-25 DIAGNOSIS — Z888 Allergy status to other drugs, medicaments and biological substances status: Secondary | ICD-10-CM | POA: Insufficient documentation

## 2019-05-25 DIAGNOSIS — Z7901 Long term (current) use of anticoagulants: Secondary | ICD-10-CM | POA: Diagnosis not present

## 2019-05-25 DIAGNOSIS — Z79899 Other long term (current) drug therapy: Secondary | ICD-10-CM | POA: Diagnosis not present

## 2019-05-25 DIAGNOSIS — J449 Chronic obstructive pulmonary disease, unspecified: Secondary | ICD-10-CM | POA: Diagnosis not present

## 2019-05-25 LAB — CBC
HCT: 51.9 % — ABNORMAL HIGH (ref 36.0–46.0)
Hemoglobin: 15.6 g/dL — ABNORMAL HIGH (ref 12.0–15.0)
MCH: 30.1 pg (ref 26.0–34.0)
MCHC: 30.1 g/dL (ref 30.0–36.0)
MCV: 100 fL (ref 80.0–100.0)
Platelets: 221 10*3/uL (ref 150–400)
RBC: 5.19 MIL/uL — ABNORMAL HIGH (ref 3.87–5.11)
RDW: 17.1 % — ABNORMAL HIGH (ref 11.5–15.5)
WBC: 5.9 10*3/uL (ref 4.0–10.5)
nRBC: 0 % (ref 0.0–0.2)

## 2019-05-25 LAB — BASIC METABOLIC PANEL
Anion gap: 15 (ref 5–15)
BUN: 21 mg/dL (ref 8–23)
CO2: 25 mmol/L (ref 22–32)
Calcium: 8.8 mg/dL — ABNORMAL LOW (ref 8.9–10.3)
Chloride: 101 mmol/L (ref 98–111)
Creatinine, Ser: 1.1 mg/dL — ABNORMAL HIGH (ref 0.44–1.00)
GFR calc Af Amer: >60 mL/min (ref >60)
GFR calc non Af Amer: 52 mL/min — ABNORMAL LOW (ref >60)
Glucose, Bld: 85 mg/dL (ref 70–99)
Potassium: 3.6 mmol/L (ref 3.5–5.1)
Sodium: 141 mmol/L (ref 135–145)

## 2019-05-25 LAB — BRAIN NATRIURETIC PEPTIDE: B Natriuretic Peptide: 1838.4 pg/mL — ABNORMAL HIGH (ref 0.0–100.0)

## 2019-05-25 NOTE — Patient Instructions (Addendum)
Labs done today. We will contact you only if your labs are abnormal.  You have been referred to [facility] on June 2nd at 1:30 PM, parking code= [id number]  No medication changes were made. Please continue all current medications as prescribed.  Your physician recommends that you schedule a follow-up appointment in: 4 weeks.  Your physician has requested that you have a cardiac catheterization. Cardiac catheterization is used to diagnose and/or treat various heart conditions. Doctors may recommend this procedure for a number of different reasons. The most common reason is to evaluate chest pain. Chest pain can be a symptom of coronary artery disease (CAD), and cardiac catheterization can show whether plaque is narrowing or blocking your heart's arteries. This procedure is also used to evaluate the valves, as well as measure the blood flow and oxygen levels in different parts of your heart. For further information please visit [email or web address]. Please follow instruction sheet, as given.  CATH INSTRUCTIONS:  You are scheduled for a Cardiac Catheterization on Friday, May 28 with Dr. Glori Bickers.  1. Please arrive at the [facility] (Main Entrance A) at [facility]: [address] [place], [place] [address] at 10:00 AM (This time is two hours before your procedure to ensure your preparation). Free valet parking service is available.   Special note: Every effort is made to have your procedure done on time. Please understand that emergencies sometimes delay scheduled procedures.  2. Diet: Do not eat solid foods after midnight.  The patient may have clear liquids until 5am upon the day of the procedure.  3. COVID TEST: Wed 5/26  4. Medication instructions in preparation for your procedure:  Stop taking Eliquis (Apixiban) on Wednesday, May 26.  HOLD YOUR TORSEMIDE AND SPIRONOLACTONE THE MORNING OF(FRIDAY, May 28TH) YOUR CATHERETIZATION  PLEASE TAKE ALL OTHER MEDICATIONS AS PRESCRIBED  EXCEPT FOR THOSE LISTED ABOVE.   5. Plan for one night stay--bring personal belongings. 6. Bring a current list of your medications and current insurance cards. 7. You MUST have a responsible person to drive you home. 8. Someone MUST be with you the first 24 hours after you arrive home or your discharge will be delayed. 9. Please wear clothes that are easy to get on and off and wear slip-on shoes.  Thank you for allowing [place] to care for you!   -- [facility]

## 2019-05-25 NOTE — Progress Notes (Addendum)
Advanced HF Clinic Note   Date:  05/25/2019   ID:  Angela Shepherd, DOB 1950-04-15, MRN [id number]  Location: Home  Provider location: [place] [facility] Type of Visit: Established patient  PCP:  Midge Minium, MD  Cardiologist:  No primary care provider on file. Primary HF: Agastya Meister  Chief Complaint: Heart Failure follow-up   History of Present Illness:  Angela Shepherd is a 69 y.o. 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 PMHx also notable for HTN and prior tobacco abuse.  Was admitted in December 2011 with acute HF. Echo with EF 30-35%, There was also evidence of ASH with septum of 1.7 PW = 1.9. Diuresed and scheduled for outpt cath.  Cath 12/2009: Normal coronaries. EF 40-45%.  Central aortic pressure was 86/47 with a mean of 62.  LV pressure is 74/13 with an EDP of 2.  Right atrial pressure mean of 3.  RV pressure 26/3 with an EDP of 5.  PA pressure 28/5 with a mean of 16.  Pulmonary capillary wedge pressure mean of 7.  Fick cardiac output was 3.4 and cardiac index 2.1.   Echo in March 2012:  EF 50-55%. Grade 2 diastolic dysfunction. IVS 1.4cm.  Echo 4/14: EF 40-45%  ECHO 09/03/13: EF 40-45% Echo 4/16: EF 45-50% Echo 6/19 EF 35-40% Mild LVH no LVOT obstruction  Echo 08/22/16 At [facility]: LVEF 40-45%, mild MR, Mild TR  Was seen by EP and noted to have a high percentage of RV pacing (99%) and it was felt this was worsening LV function. Underwent upgrade to [facility]. Jude CRT-P in 5/14.    Seen by [facility] in [facility] and found to be carrier for HOCM .  Admitted in 6/19 for syncope with SBP 71/59. Arlyce Harman and losartan stopped. Echo with EF 35-40%  She also has atrial fibrillation and has ablation with Dr. Curt Bears 11/18/2017. Underwent repeat AF ablation with Dr. Curt Bears 12/26/18.  Here for f/u with her husband. Last week was in  [place], [place] and developed acute respiratory failure and delirium. Went to [facility] ER. Intubated. With lactic acid 5.3. Found to have R-sided PNA and pulmory edema. Treated with abx and IV lasix. Tikosyn stopped. Echo EF 20-25%. Says she feels better but still worn out. Breathing ok. Can do ADLs. No edema, orthopnea or PND. Was having chest pressure all week. SBPs remain in 90s. No dizziness. No longer smoking.    Discharged on losartan 50 daily, spiro 25 daily, torsemide 40 bid and Eliquis 5 bid  Angela Shepherd denies symptoms worrisome for COVID 19.   Past Medical History:  Diagnosis Date  . Bradycardia    a. initial PPM placed 1994 at [facility] with gen change 2002. b. 1*AVB & intermittent 2nd degree AVB + CHF --> upgrade to [facility]. Jude BiV PPM 05/08/12.  . Chronic systolic CHF (congestive heart failure) ([place])    a. EF 30-35% dx in 2011 -> most recent 40-45% by echo 04/2012. b. s/p upgrade to BiV-PPM 05/08/12. c. Med titration limited by hypotension.  . Cluster headache syndrome   . Hypertr obst cardiomyop   . Hypokalemia   . Hypotension   . Paroxysmal atrial fibrillation (HCC)    a. identified on PPM interrogation b. longest episode 1 hour; if burden increases, will need [place]   . Pneumonia 2008  . Sleep apnea  Past Surgical History:  Procedure Laterality Date  . ATRIAL FIBRILLATION ABLATION N/A 11/15/2017   Procedure: ATRIAL FIBRILLATION ABLATION;  Surgeon: Constance Haw, MD;  Location: [place] CV LAB;  Service: Cardiovascular;  Laterality: N/A;  . ATRIAL FIBRILLATION ABLATION N/A 12/26/2018   Procedure: ATRIAL FIBRILLATION ABLATION;  Surgeon: Constance Haw, MD;  Location: [place] CV LAB;  Service: Cardiovascular;  Laterality: N/A;  . BI-VENTRICULAR PACEMAKER UPGRADE N/A 05/08/2012   upgrade to SJM Anthem (CRT-P) by Dr Rayann Heman  . CARDIOVERSION N/A 09/19/2017   Procedure: CARDIOVERSION;  Surgeon: Sanda Klein, MD;  Location: [place] ENDOSCOPY;  Service: Cardiovascular;   Laterality: N/A;  . CARDIOVERSION N/A 10/01/2018   Procedure: CARDIOVERSION;  Surgeon: Donato Heinz, MD;  Location: [facility] ENDOSCOPY;  Service: Endoscopy;  Laterality: N/A;  . PACEMAKER INSERTION  1994; 2002; 05/08/2012  . TONSILLECTOMY  ~ 1959  . TUBAL LIGATION  1984?     Current Outpatient Medications  Medication Sig Dispense Refill  . allopurinol (ZYLOPRIM) 100 MG tablet TAKE 1/2 TABLET (50 MG TOTAL) BY MOUTH DAILY. 15 tablet 1  . ALPRAZolam (XANAX) 0.25 MG tablet TAKE 1 TABLET BY MOUTH TWICE DAILY AS NEEDED FOR ANXIETY 30 tablet 1  . AMBULATORY NON FORMULARY MEDICATION Medication Name: 100% O2 15 leters a min for 15-20 mins  Via Nasal cannual 1 Bottle 2  . amitriptyline (ELAVIL) 10 MG tablet Take 10 mg by mouth at bedtime as needed.    . benzonatate (TESSALON) 200 MG capsule Take 200 mg by mouth 3 (three) times daily as needed.    . budesonide-formoterol (SYMBICORT) 160-4.5 MCG/ACT inhaler Inhale 2 puffs into the lungs 2 (two) times daily. 1 Inhaler 5  . colchicine 0.6 MG tablet Take 1 tablet (0.6 mg total) by mouth 2 (two) times daily as needed (for gout flare ups.). 60 tablet 1  . diphenhydrAMINE (SOMINEX) 25 MG tablet Take by mouth.    Arne Cleveland 5 MG TABS tablet TAKE 1 TABLET BY MOUTH TWICE DAILY 60 tablet 2  . FLUoxetine (PROZAC) 20 MG capsule TAKE 1 CAPSULE (20 MG TOTAL) BY MOUTH DAILY. 90 capsule 1  . guaiFENesin (MUCINEX) 600 MG 12 hr tablet Take 600 mg by mouth. As needed    . ipratropium-albuterol (DUONEB) 0.5-2.5 (3) MG/3ML SOLN USE 1 VIAL (3ML) BY NEBULIZER EVERY 4 TO 6 HOURS AS NEEDED 270 mL 2  . losartan (COZAAR) 50 MG tablet Take 50 mg by mouth daily.    . Melatonin 1 MG CHEW Chew 2 mg by mouth at bedtime as needed.    . montelukast (SINGULAIR) 10 MG tablet TAKE 1 TABLET (10 MG TOTAL) BY MOUTH AT BEDTIME. 60 tablet 2  . Spacer/Aero-Holding Chambers (AEROCHAMBER MV) inhaler Use as instructed with Symbicort 1 each 0  . spironolactone (ALDACTONE) 25 MG tablet Take by  mouth.    . SUMAtriptan (IMITREX) 50 MG tablet TAKE 1 TABLET AT ONSET OF HEADACHE. MAY REPEAT IN 2 HOURS IF HEADACHE PERSISTS OR RECURS. 10 tablet 6  . tiotropium (SPIRIVA) 18 MCG inhalation capsule Place 18 mcg into inhaler and inhale daily.    Marland Kitchen torsemide (DEMADEX) 20 MG tablet Take 40 mg by mouth 2 (two) times daily.    . VENTOLIN HFA 108 (90 Base) MCG/ACT inhaler INHALE 1-2 PUFFS BY MOUTH INTO THE LUNGS EVERY 6 (SIX) HOURS AS NEEDED FOR WHEEZING OR SHORTNESS OF BREATH. 18 g 5   No current facility-administered medications for this encounter.    Allergies:   Prednisone  Social History:  The patient  reports that she quit smoking about 10 months ago. Her smoking use included cigarettes. She has a 15.00 pack-year smoking history. She has never used smokeless tobacco. She reports current alcohol use of about 2.0 standard drinks of alcohol per week. She reports that she does not use drugs.   Family History:  The patient's family history includes Cancer (age of onset: 55) in her sister; Cardiomyopathy in her sister; Dementia in her mother; Diabetes in her brother and another family member; Heart disease in her sister.   ROS:  Please see the history of present illness.   All other systems are personally reviewed and negative.   Vitals:   05/25/19 1502  BP: 94/62  Pulse: 68  SpO2: 94%  Weight: 56.2 kg (124 lb)     Exam:   General:  Weak appearing. No resp difficulty HEENT: normal Neck: supple. JVP 5-6 Carotids 2+ bilat; no bruits. No lymphadenopathy or thryomegaly appreciated. Cor: PMI nondisplaced. Regular rate & rhythm. No rubs, gallops or murmurs. Lungs: clear with markedly reduced BS throoughout Abdomen: soft, nontender, nondistended. No hepatosplenomegaly. No bruits or masses. Good bowel sounds. Extremities: no cyanosis, clubbing, rash, edema multiple ecchymoses Neuro: alert & orientedx3, cranial nerves grossly intact. moves all 4 extremities w/o difficulty. Affect  pleasant  ECG: A pacing with biv pacing 64 Personally reviewed   Recent Labs: 07/15/2018: ALT 16; TSH 1.46 01/26/2019: Magnesium 2.1 05/25/2019: B Natriuretic Peptide 1,838.4; BUN 21; Creatinine, Ser 1.10; Hemoglobin 15.6; Platelets 221; Potassium 3.6; Sodium 141  Personally reviewed   Wt Readings from Last 3 Encounters:  05/25/19 56.2 kg (124 lb)  04/13/19 55.4 kg (122 lb 1.9 oz)  01/26/19 56.2 kg (123 lb 12.8 oz)      ASSESSMENT AND PLAN:  1. Chronic systolic CHF: EF [id number] s/p CRT-P upgrade 05/2012 (due to chronic RV pacing)  - Echo 6/19: LVEF 35-40%, mild MR, Mild TR - She is s/p recent hospitalization 5/21 for what sounds like a COPD flare requiring intubation. Had some concomittant HF - Echo in [place], [place] 5/21 EF 20-25% - Overall worse NYHA III-IIIb - Volume status stable. Continue torsemide 20 mg BID - Bisoprolol stopped due to recent resp failure - Continue losartan 50 daily and spiro 25 daily. BP too low to titrate or switch to Eliquis  - Will eventually try [place] - Given worsening symptoms and dropping EF will plan R/L cath in next 1-2 weeks - Likely not candidate for advanced therapies with COPD but can reconsider as needed - Check labs  2. H/o HOCM  - Stable on most recent Echo. No LVOT gradient - Has been seen in [facility] at [facility]. All 1st degree relatives have been tested  3. HTN - BP remains low  4. Tobacco use  - Quit for awhile but now back to smoking several cigarettes per day - Encouraged to quit  - PFTs relatively stable 06/2016.  5. Persistent AF/AFL - Follows with Dr. Curt Bears.  - Has repeat AF ablation with Dr. Curt Bears 12/26/18. Dofetilide stopped with recent hospitalization. Will not restart today,. Will arrange f/u in AF [place]. She has not had bleeding  - Labs today  Signed, Glori Bickers, MD  05/25/2019 11:13 PM  Advanced Heart Failure [place] [id number] [phone] (office) [phone] (fax)

## 2019-05-25 NOTE — H&P (View-Only) (Signed)
Advanced HF Clinic Note   Date:  05/25/2019   ID:  DIANDRIA MAS, DOB Sep 03, 1950, MRN [id number]  Location: Home  Provider location: [place] [facility] Type of Visit: Established patient  PCP:  Midge Minium, MD  Cardiologist:  No primary care provider on file. 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 PMHx also notable for HTN and prior tobacco abuse.  Was admitted in December 2011 with acute HF. Echo with EF 30-35%, There was also evidence of ASH with septum of 1.7 PW = 1.9. Diuresed and scheduled for outpt cath.  Cath 12/2009: Normal coronaries. EF 40-45%.  Central aortic pressure was 86/47 with a mean of 62.  LV pressure is 74/13 with an EDP of 2.  Right atrial pressure mean of 3.  RV pressure 26/3 with an EDP of 5.  PA pressure 28/5 with a mean of 16.  Pulmonary capillary wedge pressure mean of 7.  Fick cardiac output was 3.4 and cardiac index 2.1.   Echo in March 2012:  EF 50-55%. Grade 2 diastolic dysfunction. IVS 1.4cm.  Echo 4/14: EF 40-45%  ECHO 09/03/13: EF 40-45% Echo 4/16: EF 45-50% Echo 6/19 EF 35-40% Mild LVH no LVOT obstruction  Echo 08/22/16 At [facility]: LVEF 40-45%, mild MR, Mild TR  Was seen by EP and noted to have a high percentage of RV pacing (99%) and it was felt this was worsening LV function. Underwent upgrade to [facility]. Jude CRT-P in 5/14.    Seen by [facility] in [facility] and found to be carrier for HOCM .  Admitted in 6/19 for syncope with SBP 71/59. Angela Shepherd and losartan stopped. Echo with EF 35-40%  She also has atrial fibrillation and has ablation with Dr. Curt Bears 11/18/2017. Underwent repeat AF ablation with Dr. Curt Bears 12/26/18.  Here for f/u with her husband. Last week was in  [place], [place] and developed acute respiratory failure and delirium. Went to [facility] ER. Intubated. With lactic acid 5.3. Found to have R-sided PNA and pulmory edema. Treated with abx and IV lasix. Tikosyn stopped. Echo EF 20-25%. Says she feels better but still worn out. Breathing ok. Can do ADLs. No edema, orthopnea or PND. Was having chest pressure all week. SBPs remain in 90s. No dizziness. No longer smoking.    Discharged on losartan 50 daily, spiro 25 daily, torsemide 40 bid and Eliquis 5 bid  Angela Shepherd denies symptoms worrisome for COVID 19.   Past Medical History:  Diagnosis Date  . Bradycardia    a. initial PPM placed 1994 at [facility] with gen change 2002. b. 1*AVB & intermittent 2nd degree AVB + CHF --> upgrade to [facility]. Jude BiV PPM 05/08/12.  . Chronic systolic CHF (congestive heart failure) ([place])    a. EF 30-35% dx in 2011 -> most recent 40-45% by echo 04/2012. b. s/p upgrade to BiV-PPM 05/08/12. c. Med titration limited by hypotension.  . Cluster headache syndrome   . Hypertr obst cardiomyop   . Hypokalemia   . Hypotension   . Paroxysmal atrial fibrillation (HCC)    a. identified on PPM interrogation b. longest episode 1 hour; if burden increases, will need [place]   . Pneumonia 2008  . Sleep apnea  Past Surgical History:  Procedure Laterality Date  . ATRIAL FIBRILLATION ABLATION N/A 11/15/2017   Procedure: ATRIAL FIBRILLATION ABLATION;  Surgeon: Constance Haw, MD;  Location: [place] CV LAB;  Service: Cardiovascular;  Laterality: N/A;  . ATRIAL FIBRILLATION ABLATION N/A 12/26/2018   Procedure: ATRIAL FIBRILLATION ABLATION;  Surgeon: Constance Haw, MD;  Location: [place] CV LAB;  Service: Cardiovascular;  Laterality: N/A;  . BI-VENTRICULAR PACEMAKER UPGRADE N/A 05/08/2012   upgrade to SJM Anthem (CRT-P) by Dr Rayann Heman  . CARDIOVERSION N/A 09/19/2017   Procedure: CARDIOVERSION;  Surgeon: Sanda Klein, MD;  Location: [place] ENDOSCOPY;  Service: Cardiovascular;   Laterality: N/A;  . CARDIOVERSION N/A 10/01/2018   Procedure: CARDIOVERSION;  Surgeon: Donato Heinz, MD;  Location: [facility] ENDOSCOPY;  Service: Endoscopy;  Laterality: N/A;  . PACEMAKER INSERTION  1994; 2002; 05/08/2012  . TONSILLECTOMY  ~ 1959  . TUBAL LIGATION  1984?     Current Outpatient Medications  Medication Sig Dispense Refill  . allopurinol (ZYLOPRIM) 100 MG tablet TAKE 1/2 TABLET (50 MG TOTAL) BY MOUTH DAILY. 15 tablet 1  . ALPRAZolam (XANAX) 0.25 MG tablet TAKE 1 TABLET BY MOUTH TWICE DAILY AS NEEDED FOR ANXIETY 30 tablet 1  . AMBULATORY NON FORMULARY MEDICATION Medication Name: 100% O2 15 leters a min for 15-20 mins  Via Nasal cannual 1 Bottle 2  . amitriptyline (ELAVIL) 10 MG tablet Take 10 mg by mouth at bedtime as needed.    . benzonatate (TESSALON) 200 MG capsule Take 200 mg by mouth 3 (three) times daily as needed.    . budesonide-formoterol (SYMBICORT) 160-4.5 MCG/ACT inhaler Inhale 2 puffs into the lungs 2 (two) times daily. 1 Inhaler 5  . colchicine 0.6 MG tablet Take 1 tablet (0.6 mg total) by mouth 2 (two) times daily as needed (for gout flare ups.). 60 tablet 1  . diphenhydrAMINE (SOMINEX) 25 MG tablet Take by mouth.    Arne Cleveland 5 MG TABS tablet TAKE 1 TABLET BY MOUTH TWICE DAILY 60 tablet 2  . FLUoxetine (PROZAC) 20 MG capsule TAKE 1 CAPSULE (20 MG TOTAL) BY MOUTH DAILY. 90 capsule 1  . guaiFENesin (MUCINEX) 600 MG 12 hr tablet Take 600 mg by mouth. As needed    . ipratropium-albuterol (DUONEB) 0.5-2.5 (3) MG/3ML SOLN USE 1 VIAL (3ML) BY NEBULIZER EVERY 4 TO 6 HOURS AS NEEDED 270 mL 2  . losartan (COZAAR) 50 MG tablet Take 50 mg by mouth daily.    . Melatonin 1 MG CHEW Chew 2 mg by mouth at bedtime as needed.    . montelukast (SINGULAIR) 10 MG tablet TAKE 1 TABLET (10 MG TOTAL) BY MOUTH AT BEDTIME. 60 tablet 2  . Spacer/Aero-Holding Chambers (AEROCHAMBER MV) inhaler Use as instructed with Symbicort 1 each 0  . spironolactone (ALDACTONE) 25 MG tablet Take by  mouth.    . SUMAtriptan (IMITREX) 50 MG tablet TAKE 1 TABLET AT ONSET OF HEADACHE. MAY REPEAT IN 2 HOURS IF HEADACHE PERSISTS OR RECURS. 10 tablet 6  . tiotropium (SPIRIVA) 18 MCG inhalation capsule Place 18 mcg into inhaler and inhale daily.    Marland Kitchen torsemide (DEMADEX) 20 MG tablet Take 40 mg by mouth 2 (two) times daily.    . VENTOLIN HFA 108 (90 Base) MCG/ACT inhaler INHALE 1-2 PUFFS BY MOUTH INTO THE LUNGS EVERY 6 (SIX) HOURS AS NEEDED FOR WHEEZING OR SHORTNESS OF BREATH. 18 g 5   No current facility-administered medications for this encounter.    Allergies:   Prednisone  Social History:  The patient  reports that she quit smoking about 10 months ago. Her smoking use included cigarettes. She has a 15.00 pack-year smoking history. She has never used smokeless tobacco. She reports current alcohol use of about 2.0 standard drinks of alcohol per week. She reports that she does not use drugs.   Family History:  The patient's family history includes Cancer (age of onset: 5) in her sister; Cardiomyopathy in her sister; Dementia in her mother; Diabetes in her brother and another family member; Heart disease in her sister.   ROS:  Please see the history of present illness.   All other systems are personally reviewed and negative.   Vitals:   05/25/19 1502  BP: 94/62  Pulse: 68  SpO2: 94%  Weight: 56.2 kg (124 lb)     Exam:   General:  Weak appearing. No resp difficulty HEENT: normal Neck: supple. JVP 5-6 Carotids 2+ bilat; no bruits. No lymphadenopathy or thryomegaly appreciated. Cor: PMI nondisplaced. Regular rate & rhythm. No rubs, gallops or murmurs. Lungs: clear with markedly reduced BS throoughout Abdomen: soft, nontender, nondistended. No hepatosplenomegaly. No bruits or masses. Good bowel sounds. Extremities: no cyanosis, clubbing, rash, edema multiple ecchymoses Neuro: alert & orientedx3, cranial nerves grossly intact. moves all 4 extremities w/o difficulty. Affect pleasant   ECG: A pacing with biv pacing 64 Personally reviewed   Recent Labs: 07/15/2018: ALT 16; TSH 1.46 01/26/2019: Magnesium 2.1 05/25/2019: B Natriuretic Peptide 1,838.4; BUN 21; Creatinine, Ser 1.10; Hemoglobin 15.6; Platelets 221; Potassium 3.6; Sodium 141  Personally reviewed   Wt Readings from Last 3 Encounters:  05/25/19 56.2 kg (124 lb)  04/13/19 55.4 kg (122 lb 1.9 oz)  01/26/19 56.2 kg (123 lb 12.8 oz)      ASSESSMENT AND PLAN:  1. Chronic systolic CHF: EF [id number] s/p CRT-P upgrade 05/2012 (due to chronic RV pacing)  - Echo 6/19: LVEF 35-40%, mild MR, Mild TR - She is s/p recent hospitalization 5/21 for what sounds like a COPD flare requiring intubation. Had some concomittant HF - Echo in [place], [place] 5/21 EF 20-25% - Overall worse NYHA III-IIIb - Volume status stable. Continue torsemide 20 mg BID - Bisoprolol stopped due to recent resp failure - Continue losartan 50 daily and spiro 25 daily. BP too low to titrate or switch to Eliquis  - Will eventually try [place] - Given worsening symptoms and dropping EF will plan R/L cath in next 1-2 weeks - Likely not candidate for advanced therapies with COPD but can reconsider as needed - Check labs  2. H/o HOCM  - Stable on most recent Echo. No LVOT gradient - Has been seen in [place] at [facility]. All 1st degree relatives have been tested  3. HTN - BP remains low  4. Tobacco use  - Quit for awhile but now back to smoking several cigarettes per day - Encouraged to quit  - PFTs relatively stable 06/2016.  5. Persistent AF/AFL - Follows with Dr. Curt Bears.  - Has repeat AF ablation with Dr. Curt Bears 12/26/18. Dofetilide stopped with recent hospitalization. Will not restart today,. Will arrange f/u in AF [place]. She has not had bleeding  - Labs today  Signed, Glori Bickers, MD  05/25/2019 11:13 PM  Advanced Heart Failure [place] and [place]  [phone] (office) [phone] (fax)

## 2019-05-26 ENCOUNTER — Other Ambulatory Visit (HOSPITAL_COMMUNITY): Payer: Self-pay | Admitting: *Deleted

## 2019-05-26 DIAGNOSIS — I5042 Chronic combined systolic (congestive) and diastolic (congestive) heart failure: Secondary | ICD-10-CM

## 2019-05-26 LAB — CUP PACEART REMOTE DEVICE CHECK
Battery Remaining Longevity: 14 mo
Battery Remaining Percentage: 19 %
Battery Voltage: 2.77 V
Brady Statistic AP VP Percent: 43 %
Brady Statistic AP VS Percent: 1 %
Brady Statistic AS VP Percent: 56 %
Brady Statistic AS VS Percent: 1 %
Brady Statistic RA Percent Paced: 42 %
Date Time Interrogation Session: 20210517150045
Implantable Lead Implant Date: 19940705
Implantable Lead Implant Date: 19940705
Implantable Lead Implant Date: 20140501
Implantable Lead Location: 753858
Implantable Lead Location: 753859
Implantable Lead Location: 753860
Implantable Lead Model: 4024
Implantable Lead Model: 4524
Implantable Pulse Generator Implant Date: 20140501
Lead Channel Impedance Value: 350 Ohm
Lead Channel Impedance Value: 410 Ohm
Lead Channel Impedance Value: 590 Ohm
Lead Channel Pacing Threshold Amplitude: 1 V
Lead Channel Pacing Threshold Amplitude: 1 V
Lead Channel Pacing Threshold Amplitude: 1.25 V
Lead Channel Pacing Threshold Pulse Width: 0.4 ms
Lead Channel Pacing Threshold Pulse Width: 0.4 ms
Lead Channel Pacing Threshold Pulse Width: 1 ms
Lead Channel Sensing Intrinsic Amplitude: 2.1 mV
Lead Channel Sensing Intrinsic Amplitude: 3.3 mV
Lead Channel Setting Pacing Amplitude: 2 V
Lead Channel Setting Pacing Amplitude: 2.5 V
Lead Channel Setting Pacing Amplitude: 2.5 V
Lead Channel Setting Pacing Pulse Width: 0.4 ms
Lead Channel Setting Pacing Pulse Width: 1 ms
Lead Channel Setting Sensing Sensitivity: 2 mV
Pulse Gen Model: 3210
Pulse Gen Serial Number: 2936617

## 2019-05-26 MED ORDER — SODIUM CHLORIDE 0.9% FLUSH: 3.0000 mL | Freq: Two times a day (BID) | INTRAVENOUS | Status: DC

## 2019-05-27 ENCOUNTER — Other Ambulatory Visit: Payer: Self-pay | Admitting: *Deleted

## 2019-05-27 NOTE — Telephone Encounter (Signed)
Attempted to call patient on mobile phone, no option to leave message.

## 2019-05-27 NOTE — Progress Notes (Signed)
Remote pacemaker transmission.   

## 2019-05-27 NOTE — Patient Outreach (Signed)
[facility]) Care Management  05/27/2019  Angela Shepherd 02-20-1950 [id number]   Transition of care telephone call  Referral received:05/26/19 Initial outreach:05/27/19 Insurance: [facility]   Initial unsuccessful telephone call to patient's preferred number in order to complete transition of care assessment; no answer, voicemail box full unable to leave a message.    Objective: Per the electronic medical record,Angela Shepherd   was hospitalized at [facility]  From 5/8-5/12/21  With/for Acute on Chronic congestive heart failure, COPD . Comorbidities include: Persistent Atrial Fib, Hypertrophic obstructive cardiomyopathy, Hypertension , diastolic heart failure, COPD. She  was discharged to home on 05/20/19  without the need for home health services or durable medical equipment per the discharge summary.   Plan: This RNCM will route unsuccessful outreach letter with [place] Management pamphlet and 24 hour Nurse Advice Line Magnet to [place] Management clinical pool to be mailed to patient's home address. This RNCM will attempt another outreach within 4 business days.   Joylene Draft, RN, BSN  [place] Management Coordinator  [phone]- Mobile [phone]- Toll Free Main Office

## 2019-06-01 ENCOUNTER — Other Ambulatory Visit: Payer: Self-pay | Admitting: *Deleted

## 2019-06-01 NOTE — Patient Outreach (Addendum)
[facility]) Care Management  06/01/2019  Angela Shepherd [birth year] [id number]   Transition of care call Referral received: 05/26/19 Initial outreach attempt: 05/27/19 Insurance: [employer]   2nd unsuccessful telephone call to patient's preferred contact number in order to complete post hospital discharge transition of care assessment , no answer unable to leave a voicemail message as voice mailbox is full    Objective: Per the electronic medical record,Angela Shepherd   was hospitalized at [facility]  From 5/8-5/12/21  With/for Acute on Chronic congestive heart failure, COPD . Comorbidities include: Persistent Atrial Fib, Hypertrophic obstructive cardiomyopathy, Hypertension , diastolic heart failure, COPD. She  was discharged to home on 05/20/19  without the need for home health services or durable medical equipment per the discharge summary. Noted patient has attended post discharge office visit with Dr. Haroldine Laws on 5/17 and plans for right/left heart cath on 06/05/19.   Plan If no return call from patient will attempt 3rd outreach in the next 4 business days.    Joylene Draft, RN, BSN  [facility] Management Coordinator  [phone]- Mobile [phone]- Toll Free Main Office

## 2019-06-03 ENCOUNTER — Other Ambulatory Visit (HOSPITAL_COMMUNITY)
Admission: RE | Admit: 2019-06-03 | Discharge: 2019-06-03 | Disposition: A | Discharge status: Home / Self Care | Payer: 59 | Source: Ambulatory Visit | Attending: Internal Medicine | Admitting: Internal Medicine

## 2019-06-03 DIAGNOSIS — Z20822 Contact with and (suspected) exposure to covid-19: Secondary | ICD-10-CM | POA: Diagnosis not present

## 2019-06-03 DIAGNOSIS — Z01812 Encounter for preprocedural laboratory examination: Secondary | ICD-10-CM | POA: Insufficient documentation

## 2019-06-03 LAB — SARS CORONAVIRUS 2 (TAT 6-24 HRS): SARS Coronavirus 2: NEGATIVE

## 2019-06-04 ENCOUNTER — Other Ambulatory Visit: Payer: Self-pay | Admitting: *Deleted

## 2019-06-04 NOTE — Patient Outreach (Addendum)
[facility]) Care Management  06/04/2019  WANDA OBERLANDER May 02, 1950 [id number]  Transition of care call  Referral received: 05/26/19 Initial outreach attempt: 05/27/19 Insurance: [place] unsuccessful telephone call to patient's preferred contact number in order to complete post hospital discharge transition of care assessment; no answer, unable to leave a voicemail message as mailbox is full .   Objective: Per the electronic medical record,Karron Yarboroughwas hospitalized at [facility] 5/8-5/12/21With/for Acute on Chronic congestive heart failure, COPD. Comorbidities include: Persistent Atrial Fib, Hypertrophic obstructive cardiomyopathy, Hypertension , diastolic heart failure, COPD. Shewas discharged to home on 5/12/21without the need for home health services or durable medical equipment per the discharge summary. Noted patient has attended post discharge office visit with Dr. Haroldine Laws on 5/17 and plans for right/left heart cath on 06/05/19.     Plan: If no return call from patient, will close case to [facility] services in 10 business days after initial post hospital discharge outreach, on 05/27/19.  Joylene Draft, RN, BSN  [facility] Management Coordinator  [phone]- Mobile [phone]- Toll Free Main Office

## 2019-06-05 ENCOUNTER — Other Ambulatory Visit: Payer: Self-pay

## 2019-06-05 ENCOUNTER — Ambulatory Visit (HOSPITAL_COMMUNITY)
Admission: RE | Admit: 2019-06-05 | Discharge: 2019-06-05 | Disposition: A | Discharge status: Home / Self Care | Payer: 59 | Attending: Internal Medicine | Admitting: Internal Medicine

## 2019-06-05 ENCOUNTER — Encounter (HOSPITAL_COMMUNITY)
Admission: RE | Disposition: A | Discharge status: Home / Self Care | Payer: Self-pay | Source: Home / Self Care | Attending: Internal Medicine

## 2019-06-05 DIAGNOSIS — I110 Hypertensive heart disease with heart failure: Secondary | ICD-10-CM | POA: Insufficient documentation

## 2019-06-05 DIAGNOSIS — I428 Other cardiomyopathies: Secondary | ICD-10-CM | POA: Diagnosis not present

## 2019-06-05 DIAGNOSIS — Z7951 Long term (current) use of inhaled steroids: Secondary | ICD-10-CM | POA: Diagnosis not present

## 2019-06-05 DIAGNOSIS — Z888 Allergy status to other drugs, medicaments and biological substances status: Secondary | ICD-10-CM | POA: Insufficient documentation

## 2019-06-05 DIAGNOSIS — G4730 Sleep apnea, unspecified: Secondary | ICD-10-CM | POA: Insufficient documentation

## 2019-06-05 DIAGNOSIS — Z79899 Other long term (current) drug therapy: Secondary | ICD-10-CM | POA: Insufficient documentation

## 2019-06-05 DIAGNOSIS — Z7901 Long term (current) use of anticoagulants: Secondary | ICD-10-CM | POA: Diagnosis not present

## 2019-06-05 DIAGNOSIS — I421 Obstructive hypertrophic cardiomyopathy: Secondary | ICD-10-CM | POA: Insufficient documentation

## 2019-06-05 DIAGNOSIS — Z950 Presence of cardiac pacemaker: Secondary | ICD-10-CM | POA: Diagnosis not present

## 2019-06-05 DIAGNOSIS — I5023 Acute on chronic systolic (congestive) heart failure: Secondary | ICD-10-CM | POA: Insufficient documentation

## 2019-06-05 DIAGNOSIS — Z8249 Family history of ischemic heart disease and other diseases of the circulatory system: Secondary | ICD-10-CM | POA: Diagnosis not present

## 2019-06-05 DIAGNOSIS — I4819 Other persistent atrial fibrillation: Secondary | ICD-10-CM | POA: Diagnosis not present

## 2019-06-05 DIAGNOSIS — I5042 Chronic combined systolic (congestive) and diastolic (congestive) heart failure: Secondary | ICD-10-CM

## 2019-06-05 HISTORY — PX: RIGHT/LEFT HEART CATH AND CORONARY ANGIOGRAPHY: CATH118266

## 2019-06-05 LAB — POCT I-STAT EG7
Acid-Base Excess: 0 mmol/L (ref 0.0–2.0)
Acid-base deficit: 2 mmol/L (ref 0.0–2.0)
Bicarbonate: 23.6 mmol/L (ref 20.0–28.0)
Bicarbonate: 25.7 mmol/L (ref 20.0–28.0)
Calcium, Ion: 0.95 mmol/L — ABNORMAL LOW (ref 1.15–1.40)
Calcium, Ion: 1.17 mmol/L (ref 1.15–1.40)
HCT: 36 % (ref 36.0–46.0)
HCT: 40 % (ref 36.0–46.0)
Hemoglobin: 12.2 g/dL (ref 12.0–15.0)
Hemoglobin: 13.6 g/dL (ref 12.0–15.0)
O2 Saturation: 57 %
O2 Saturation: 58 %
Potassium: 2.8 mmol/L — ABNORMAL LOW (ref 3.5–5.1)
Potassium: 3.3 mmol/L — ABNORMAL LOW (ref 3.5–5.1)
Sodium: 143 mmol/L (ref 135–145)
Sodium: 146 mmol/L — ABNORMAL HIGH (ref 135–145)
TCO2: 25 mmol/L (ref 22–32)
TCO2: 27 mmol/L (ref 22–32)
pCO2, Ven: 40.9 mmHg — ABNORMAL LOW (ref 44.0–60.0)
pCO2, Ven: 43.8 mmHg — ABNORMAL LOW (ref 44.0–60.0)
pH, Ven: 7.369 (ref 7.250–7.430)
pH, Ven: 7.377 (ref 7.250–7.430)
pO2, Ven: 30 mmHg — CL (ref 32.0–45.0)
pO2, Ven: 31 mmHg — CL (ref 32.0–45.0)

## 2019-06-05 LAB — POCT I-STAT 7, (LYTES, BLD GAS, ICA,H+H)
Acid-base deficit: 3 mmol/L — ABNORMAL HIGH (ref 0.0–2.0)
Bicarbonate: 20.2 mmol/L (ref 20.0–28.0)
Calcium, Ion: 0.91 mmol/L — ABNORMAL LOW (ref 1.15–1.40)
HCT: 36 % (ref 36.0–46.0)
Hemoglobin: 12.2 g/dL (ref 12.0–15.0)
O2 Saturation: 96 %
Potassium: 2.8 mmol/L — ABNORMAL LOW (ref 3.5–5.1)
Sodium: 147 mmol/L — ABNORMAL HIGH (ref 135–145)
TCO2: 21 mmol/L — ABNORMAL LOW (ref 22–32)
pCO2 arterial: 31 mmHg — ABNORMAL LOW (ref 32.0–48.0)
pH, Arterial: 7.423 (ref 7.350–7.450)
pO2, Arterial: 76 mmHg — ABNORMAL LOW (ref 83.0–108.0)

## 2019-06-05 SURGERY — RIGHT/LEFT HEART CATH AND CORONARY ANGIOGRAPHY
Anesthesia: LOCAL

## 2019-06-05 MED ORDER — HYDRALAZINE HCL 20 MG/ML IJ SOLN: 10.0000 mg | INTRAMUSCULAR | Status: DC | PRN

## 2019-06-05 MED ORDER — SODIUM CHLORIDE 0.9 % IV SOLN: 250.0000 mL | INTRAVENOUS | Status: DC | PRN

## 2019-06-05 MED ORDER — ONDANSETRON HCL 4 MG/2ML IJ SOLN: 4.0000 mg | Freq: Four times a day (QID) | INTRAMUSCULAR | Status: DC | PRN

## 2019-06-05 MED ORDER — HEPARIN (PORCINE) IN NACL 1000-0.9 UT/500ML-% IV SOLN
INTRAVENOUS | Status: DC | PRN
  Administered 2019-06-05 (×2): 500 mL

## 2019-06-05 MED ORDER — ACETAMINOPHEN 325 MG PO TABS: 650.0000 mg | ORAL_TABLET | ORAL | Status: DC | PRN

## 2019-06-05 MED ORDER — LIDOCAINE HCL (PF) 1 % IJ SOLN
INTRAMUSCULAR | Status: AC
  Filled 2019-06-05: qty 30

## 2019-06-05 MED ORDER — SODIUM CHLORIDE 0.9% FLUSH: 3.0000 mL | Freq: Two times a day (BID) | INTRAVENOUS | Status: DC

## 2019-06-05 MED ORDER — HEPARIN SODIUM (PORCINE) 1000 UNIT/ML IJ SOLN
INTRAMUSCULAR | Status: DC | PRN
  Administered 2019-06-05: 2500 [IU] via INTRAVENOUS

## 2019-06-05 MED ORDER — MIDAZOLAM HCL 2 MG/2ML IJ SOLN
INTRAMUSCULAR | Status: AC
  Filled 2019-06-05: qty 2

## 2019-06-05 MED ORDER — ASPIRIN 81 MG PO CHEW
81.0000 mg | CHEWABLE_TABLET | ORAL | Status: AC
  Administered 2019-06-05: 81 mg via ORAL
  Filled 2019-06-05: qty 1

## 2019-06-05 MED ORDER — SODIUM CHLORIDE 0.9% FLUSH: 3.0000 mL | INTRAVENOUS | Status: DC | PRN

## 2019-06-05 MED ORDER — IOHEXOL 350 MG/ML SOLN
INTRAVENOUS | Status: DC | PRN
  Administered 2019-06-05: 50 mL via INTRA_ARTERIAL

## 2019-06-05 MED ORDER — HEPARIN (PORCINE) IN NACL 1000-0.9 UT/500ML-% IV SOLN
INTRAVENOUS | Status: AC
  Filled 2019-06-05: qty 1000

## 2019-06-05 MED ORDER — HEPARIN SODIUM (PORCINE) 1000 UNIT/ML IJ SOLN
INTRAMUSCULAR | Status: AC
  Filled 2019-06-05: qty 1

## 2019-06-05 MED ORDER — LABETALOL HCL 5 MG/ML IV SOLN: 10.0000 mg | INTRAVENOUS | Status: DC | PRN

## 2019-06-05 MED ORDER — FENTANYL CITRATE (PF) 100 MCG/2ML IJ SOLN
INTRAMUSCULAR | Status: DC | PRN
  Administered 2019-06-05: 25 ug via INTRAVENOUS

## 2019-06-05 MED ORDER — VERAPAMIL HCL 2.5 MG/ML IV SOLN
INTRAVENOUS | Status: AC
  Filled 2019-06-05: qty 2

## 2019-06-05 MED ORDER — VERAPAMIL HCL 2.5 MG/ML IV SOLN
INTRAVENOUS | Status: DC | PRN
  Administered 2019-06-05: 10 mL via INTRA_ARTERIAL

## 2019-06-05 MED ORDER — MIDAZOLAM HCL 2 MG/2ML IJ SOLN
INTRAMUSCULAR | Status: DC | PRN
  Administered 2019-06-05: 1 mg via INTRAVENOUS

## 2019-06-05 MED ORDER — FENTANYL CITRATE (PF) 100 MCG/2ML IJ SOLN
INTRAMUSCULAR | Status: AC
  Filled 2019-06-05: qty 2

## 2019-06-05 MED ORDER — LIDOCAINE HCL (PF) 1 % IJ SOLN
INTRAMUSCULAR | Status: DC | PRN
  Administered 2019-06-05: 2 mL via INTRADERMAL
  Administered 2019-06-05: 5 mL via INTRADERMAL

## 2019-06-05 MED ORDER — SODIUM CHLORIDE 0.9 % IV SOLN: INTRAVENOUS | Status: DC

## 2019-06-05 SURGICAL SUPPLY — 12 items
CATH 5FR JL3.5 JR4 ANG PIG MP (CATHETERS) ×2 IMPLANT
CATH BALLN WEDGE 5F 110CM (CATHETERS) ×2 IMPLANT
DEVICE RAD COMP TR BAND LRG (VASCULAR PRODUCTS) ×2 IMPLANT
GLIDESHEATH SLEND SS 6F .021 (SHEATH) ×6 IMPLANT
GUIDEWIRE .025 260CM (WIRE) ×2 IMPLANT
GUIDEWIRE INQWIRE 1.5J.035X260 (WIRE) ×1 IMPLANT
INQWIRE 1.5J .035X260CM (WIRE) ×2
KIT MICROPUNCTURE NIT STIFF (SHEATH) ×6 IMPLANT
PACK CARDIAC CATHETERIZATION (CUSTOM PROCEDURE TRAY) ×2 IMPLANT
SHEATH GLIDE SLENDER 4/5FR (SHEATH) ×4 IMPLANT
SHEATH PROBE COVER 6X72 (BAG) ×2 IMPLANT
TRANSDUCER W/STOPCOCK (MISCELLANEOUS) ×2 IMPLANT

## 2019-06-05 NOTE — Discharge Instructions (Signed)
Radial Site Care  This sheet gives you information about how to care for yourself after your procedure. Your health care provider may also give you more specific instructions. If you have problems or questions, contact your health care provider. What can I expect after the procedure? After the procedure, it is common to have:  Bruising and tenderness at the catheter insertion area. Follow these instructions at home: Medicines  Take over-the-counter and prescription medicines only as told by your health care provider. Insertion site care  Follow instructions from your health care provider about how to take care of your insertion site. Make sure you: ? Wash your hands with soap and water before you change your bandage (dressing). If soap and water are not available, use hand sanitizer. ? Change your dressing as told by your health care provider. ? Leave stitches (sutures), skin glue, or adhesive strips in place. These skin closures may need to stay in place for 2 weeks or longer. If adhesive strip edges start to loosen and curl up, you may trim the loose edges. Do not remove adhesive strips completely unless your health care provider tells you to do that.  Check your insertion site every day for signs of infection. Check for: ? Redness, swelling, or pain. ? Fluid or blood. ? Pus or a bad smell. ? Warmth.  Do not take baths, swim, or use a hot tub until your health care provider approves.  You may shower 24-48 hours after the procedure, or as directed by your health care provider. ? Remove the dressing and gently wash the site with plain soap and water. ? Pat the area dry with a clean towel. ? Do not rub the site. That could cause bleeding.  Do not apply powder or lotion to the site. Activity   For 24 hours after the procedure, or as directed by your health care provider: ? Do not flex or bend the affected arm. ? Do not push or pull heavy objects with the affected arm. ? Do not  drive yourself home from the hospital or clinic. You may drive 24 hours after the procedure unless your health care provider tells you not to. ? Do not operate machinery or power tools.  Do not lift anything that is heavier than 10 lb (4.5 kg), or the limit that you are told, until your health care provider says that it is safe.  Ask your health care provider when it is okay to: ? Return to work or school. ? Resume usual physical activities or sports. ? Resume sexual activity. General instructions  If the catheter site starts to bleed, raise your arm and put firm pressure on the site. If the bleeding does not stop, get help right away. This is a medical emergency.  If you went home on the same day as your procedure, a responsible adult should be with you for the first 24 hours after you arrive home.  Keep all follow-up visits as told by your health care provider. This is important. Contact a health care provider if:  You have a fever.  You have redness, swelling, or yellow drainage around your insertion site. Get help right away if:  You have unusual pain at the radial site.  The catheter insertion area swells very fast.  The insertion area is bleeding, and the bleeding does not stop when you hold steady pressure on the area.  Your arm or hand becomes pale, cool, tingly, or numb. These symptoms may represent a serious problem   that is an emergency. Do not wait to see if the symptoms will go away. Get medical help right away. Call your local emergency services (911 in the [place] Do not drive yourself to the hospital. Summary  After the procedure, it is common to have bruising and tenderness at the site.  Follow instructions from your health care provider about how to take care of your radial site wound. Check the wound every day for signs of infection.  Do not lift anything that is heavier than 10 lb (4.5 kg), or the limit that you are told, until your health care provider says  that it is safe. This information is not intended to replace advice given to you by your health care provider. Make sure you discuss any questions you have with your health care provider. Document Revised: 01/30/2017 Document Reviewed: 01/30/2017 Elsevier Patient Education  2020 Elsevier Inc.  

## 2019-06-05 NOTE — Interval H&P Note (Signed)
History and Physical Interval Note:  06/05/2019 8:58 AM  Angela Shepherd  has presented today for surgery, with the diagnosis of Heart failure.  The various methods of treatment have been discussed with the patient and family. After consideration of risks, benefits and other options for treatment, the patient has consented to  Procedure(s): RIGHT/LEFT HEART CATH AND CORONARY ANGIOGRAPHY (N/A) and possible coronary angioplasty as a surgical intervention.  The patient's history has been reviewed, patient examined, no change in status, stable for surgery.  I have reviewed the patient's chart and labs.  Questions were answered to the patient's satisfaction.     Laurinda Carreno

## 2019-06-05 NOTE — Progress Notes (Signed)
Patient being transported to the cath lab no longer needs IV team

## 2019-06-09 ENCOUNTER — Other Ambulatory Visit: Payer: Self-pay | Admitting: *Deleted

## 2019-06-09 NOTE — Patient Outreach (Signed)
[facility]) Care Management  06/09/2019  Angela Shepherd [birth year] [id number]   Transition of care /Case Closure Unsuccessful outreach    Referral received:05/26/19 Initial outreach:05/27/19 Insurance: [place]    Unable to complete post hospital discharge transition of care assessment. No return call form patient after 3 call attempts and no response to request to contact RN Care Coordinator in unsuccessful outreach letter mailed to home on 05/27/19.  Objective: Per the electronic medical record,Angela Yarboroughwas hospitalized at [place] 5/8-5/12/21With/for Acute on Chronic congestive heart failure, COPD. Comorbidities include: Persistent Atrial Fib, Hypertrophic obstructive cardiomyopathy, Hypertension , diastolic heart failure, COPD. Shewas discharged to home on 5/12/21without the need for home health services or durable medical equipment per the discharge summary. Noted patient has attended post discharge office visit with Dr. Haroldine Laws on 5/17 and plans for right/left heart cath on 06/05/19.    Plan Case closed to [facility] [employer] as it has been 10 days since initial post discharge outreach attempt.    Joylene Draft, RN, BSN  [facility] Management Coordinator  [phone]- Mobile [phone]- Toll Free Main Office

## 2019-06-10 ENCOUNTER — Other Ambulatory Visit: Payer: Self-pay | Admitting: Family Medicine

## 2019-06-10 ENCOUNTER — Other Ambulatory Visit (HOSPITAL_COMMUNITY): Payer: Self-pay | Admitting: Internal Medicine

## 2019-06-10 ENCOUNTER — Ambulatory Visit (HOSPITAL_COMMUNITY): Payer: 59 | Admitting: Nurse Practitioner

## 2019-06-10 MED FILL — ALPRAZolam 0.25 MG TABS: 0.25 | 15 days supply | Qty: 30 | Fill #0

## 2019-06-10 NOTE — Telephone Encounter (Signed)
Last OV 07/15/18 Alprazolam last filled 04/27/19 #30 with 1

## 2019-06-11 ENCOUNTER — Other Ambulatory Visit (HOSPITAL_COMMUNITY): Payer: Self-pay | Admitting: *Deleted

## 2019-06-11 ENCOUNTER — Other Ambulatory Visit (HOSPITAL_COMMUNITY): Payer: Self-pay | Admitting: Internal Medicine

## 2019-06-11 ENCOUNTER — Encounter (HOSPITAL_COMMUNITY): Payer: Self-pay

## 2019-06-11 MED ORDER — TORSEMIDE 20 MG PO TABS: 20.0000 mg | ORAL_TABLET | Freq: Two times a day (BID) | ORAL | 3 refills | Status: DC

## 2019-06-11 MED ORDER — SPIRONOLACTONE 25 MG PO TABS: 25.0000 mg | ORAL_TABLET | Freq: Every day | ORAL | 3 refills | Status: DC

## 2019-06-11 MED FILL — SYMBICORT 160-4.5 MCG INH: 160-4.5 | 30 days supply | Qty: 10 | Fill #5

## 2019-06-11 MED FILL — TORSEMIDE 20 MG TABLET: 20 | 11 days supply | Qty: 22 | Fill #0

## 2019-06-11 NOTE — Telephone Encounter (Signed)
Pt is requesting a refill of torsemide she is out of it early because of dose increase. Pt states after her cath on 5/28 Dr.Bensimhon told her to increase torsemide to 60mg  bid. Her current medication list has torsemide 20mg  bid. I do not see any note where medication was increased. Will forward to [place] .

## 2019-06-12 ENCOUNTER — Other Ambulatory Visit (HOSPITAL_COMMUNITY): Payer: Self-pay | Admitting: *Deleted

## 2019-06-12 ENCOUNTER — Other Ambulatory Visit (HOSPITAL_COMMUNITY): Payer: Self-pay | Admitting: Cardiology

## 2019-06-12 ENCOUNTER — Encounter (HOSPITAL_COMMUNITY): Payer: Self-pay

## 2019-06-12 MED ORDER — TORSEMIDE 20 MG PO TABS: 60.0000 mg | ORAL_TABLET | Freq: Two times a day (BID) | ORAL | 3 refills | Status: DC

## 2019-06-12 MED ORDER — TORSEMIDE 20 MG PO TABS: 40.0000 mg | ORAL_TABLET | Freq: Two times a day (BID) | ORAL | 3 refills | Status: DC

## 2019-06-12 MED FILL — TORSEMIDE 20 MG TABLET: 20 | 30 days supply | Qty: 120 | Fill #0

## 2019-06-12 NOTE — Telephone Encounter (Signed)
Dose changed per [employer]

## 2019-06-19 ENCOUNTER — Ambulatory Visit (HOSPITAL_COMMUNITY)
Admission: RE | Admit: 2019-06-19 | Discharge: 2019-06-19 | Disposition: A | Discharge status: Home / Self Care | Payer: 59 | Source: Ambulatory Visit | Attending: Nurse Practitioner | Admitting: Nurse Practitioner

## 2019-06-19 ENCOUNTER — Other Ambulatory Visit: Payer: Self-pay

## 2019-06-19 ENCOUNTER — Encounter (HOSPITAL_COMMUNITY): Payer: Self-pay | Admitting: Nurse Practitioner

## 2019-06-19 VITALS — BP 94/56 | HR 63 | Ht 62.0 in | Wt 122.6 lb

## 2019-06-19 DIAGNOSIS — I4819 Other persistent atrial fibrillation: Secondary | ICD-10-CM | POA: Diagnosis not present

## 2019-06-19 DIAGNOSIS — Z950 Presence of cardiac pacemaker: Secondary | ICD-10-CM | POA: Diagnosis not present

## 2019-06-19 DIAGNOSIS — Z79899 Other long term (current) drug therapy: Secondary | ICD-10-CM | POA: Diagnosis not present

## 2019-06-19 DIAGNOSIS — Z8249 Family history of ischemic heart disease and other diseases of the circulatory system: Secondary | ICD-10-CM | POA: Insufficient documentation

## 2019-06-19 DIAGNOSIS — D6869 Other thrombophilia: Secondary | ICD-10-CM | POA: Diagnosis not present

## 2019-06-19 DIAGNOSIS — Z87891 Personal history of nicotine dependence: Secondary | ICD-10-CM | POA: Diagnosis not present

## 2019-06-19 DIAGNOSIS — I5022 Chronic systolic (congestive) heart failure: Secondary | ICD-10-CM | POA: Insufficient documentation

## 2019-06-19 DIAGNOSIS — I959 Hypotension, unspecified: Secondary | ICD-10-CM | POA: Diagnosis not present

## 2019-06-19 DIAGNOSIS — I428 Other cardiomyopathies: Secondary | ICD-10-CM | POA: Diagnosis not present

## 2019-06-19 DIAGNOSIS — Z7901 Long term (current) use of anticoagulants: Secondary | ICD-10-CM | POA: Diagnosis not present

## 2019-06-19 DIAGNOSIS — I421 Obstructive hypertrophic cardiomyopathy: Secondary | ICD-10-CM | POA: Diagnosis not present

## 2019-06-19 DIAGNOSIS — I480 Paroxysmal atrial fibrillation: Secondary | ICD-10-CM | POA: Insufficient documentation

## 2019-06-19 NOTE — Progress Notes (Signed)
Primary Care Physician: Midge Minium, MD Referring Physician:Dr. Bensimhon  EP: Dr. Nedra Hai is a 69 y.o. female with a h/oPPM, CHF, HOCM, hypotension,paroxysmal  afib that is status post 2 ablations, the second one, 12/2018. She was recently hospitalized  In [place], [place],  while on the way to visit family, with acute onset respiratory distress with hypoxia . Pt found to have pnuemonia and HF,  per care everywhere, requiring emergent intubation. Their notes indicated that they did not give pt dofetilide which was on her med list and to see her cardiologist about restarting this drug on f/u  In [place].  Pt did see Dr. Haroldine Laws on f/u.  She  had recent L/R heart cath and EF was found to be 20% with severe NICM and normal coronary arteries.   So in  with conversation with the pt, Tikosyn was discussed last fall or repeat ablation. When pt found out she would have to stop her prozac, she chose not to do this and opted for the 2nd ablation which has been very effective to keep her afib quiet. However, when the Tikosyn was being considered, it was sent in to the pharmacy by Dr. Macky Lower nurse   for cost of the drug only, and not to disperse .But this made the drug show up on her med list. It first showed up on the med list with Dr. Haroldine Laws in November and then was carried forward on subsequent visits. It was never started.   On review of her [facility] reports, her afib burden has been very low since the second ablation in December. With her last report which included her recent hospitalization, she  had 4 hours of afib.over the previous 6 weeks.  I discussed with Dr. Curt Bears and with her low afib burden,he  does not feel that she needs to be on tikosyn at this point. Pt is relieved as she does not want any new meds.   Today, she denies symptoms of palpitations, chest pain, shortness of breath, orthopnea, PND, lower extremity edema, dizziness, presyncope, syncope, or  neurologic sequela. The patient is tolerating medications without difficulties and is otherwise without complaint today.   Past Medical History:  Diagnosis Date  . Bradycardia    a. initial PPM placed [facility] at [facility] with gen change 2002. b. 1*AVB & intermittent 2nd degree AVB + CHF --> upgrade to [facility]. [facility] BiV PPM 05/08/12.  . Chronic systolic CHF (congestive heart failure) ([place])    a. EF 30-35% dx in 2011 -> most recent 40-45% by echo 04/2012. b. s/p upgrade to BiV-PPM 05/08/12. c. Med titration limited by hypotension.  . Cluster headache syndrome   . Hypertr obst cardiomyop   . Hypokalemia   . Hypotension   . Paroxysmal atrial fibrillation (HCC)    a. identified on PPM interrogation b. longest episode 1 hour; if burden increases, will need [place]   . Pneumonia 2008  . Sleep apnea    Past Surgical History:  Procedure Laterality Date  . ATRIAL FIBRILLATION ABLATION N/A 11/15/2017   Procedure: ATRIAL FIBRILLATION ABLATION;  Surgeon: Constance Haw, MD;  Location: [place] CV LAB;  Service: Cardiovascular;  Laterality: N/A;  . ATRIAL FIBRILLATION ABLATION N/A 12/26/2018   Procedure: ATRIAL FIBRILLATION ABLATION;  Surgeon: Constance Haw, MD;  Location: [facility] CV LAB;  Service: Cardiovascular;  Laterality: N/A;  . BI-VENTRICULAR PACEMAKER UPGRADE N/A 05/08/2012   upgrade to SJM Anthem (CRT-P) by Dr Rayann Heman  . CARDIOVERSION  N/A 09/19/2017   Procedure: CARDIOVERSION;  Surgeon: Sanda Klein, MD;  Location: [facility] ENDOSCOPY;  Service: Cardiovascular;  Laterality: N/A;  . CARDIOVERSION N/A 10/01/2018   Procedure: CARDIOVERSION;  Surgeon: Donato Heinz, MD;  Location: [facility] ENDOSCOPY;  Service: Endoscopy;  Laterality: N/A;  . PACEMAKER INSERTION  1994; 2002; 05/08/2012  . RIGHT/LEFT HEART CATH AND CORONARY ANGIOGRAPHY N/A 06/05/2019   Procedure: RIGHT/LEFT HEART CATH AND CORONARY ANGIOGRAPHY;  Surgeon: Jolaine Artist, MD;  Location: [facility];  Service: Cardiovascular;   Laterality: N/A;  . TONSILLECTOMY  ~ 1959  . TUBAL LIGATION  1984?    Current Outpatient Medications  Medication Sig Dispense Refill  . allopurinol (ZYLOPRIM) 100 MG tablet TAKE 1/2 TABLET (50 MG TOTAL) BY MOUTH DAILY. (Patient taking differently: Take 50 mg by mouth daily. ) 15 tablet 1  . ALPRAZolam (XANAX) 0.25 MG tablet TAKE 1 TABLET BY MOUTH TWICE DAILY AS NEEDED FOR ANXIETY 30 tablet 1  . AMBULATORY NON FORMULARY MEDICATION Medication Name: 100% O2 15 leters a min for 15-20 mins  Via Nasal cannual 1 Bottle 2  . amitriptyline (ELAVIL) 10 MG tablet Take 10 mg by mouth daily as needed (migraine).     . benzonatate (TESSALON) 200 MG capsule Take 200 mg by mouth as needed.     . bisoprolol (ZEBETA) 5 MG tablet Take 5 mg by mouth daily.    . budesonide-formoterol (SYMBICORT) 160-4.5 MCG/ACT inhaler Inhale 2 puffs into the lungs 2 (two) times daily. 1 Inhaler 5  . colchicine 0.6 MG tablet Take 1 tablet (0.6 mg total) by mouth 2 (two) times daily as needed (for gout flare ups.). 60 tablet 1  . diphenhydrAMINE (SOMINEX) 25 MG tablet Take 25 mg by mouth at bedtime as needed for sleep.     Marland Kitchen ELIQUIS 5 MG TABS tablet TAKE 1 TABLET BY MOUTH TWICE DAILY (Patient taking differently: Take 5 mg by mouth 2 (two) times daily. ) 60 tablet 2  . FLUoxetine (PROZAC) 20 MG capsule TAKE 1 CAPSULE (20 MG TOTAL) BY MOUTH DAILY. (Patient taking differently: Take 20 mg by mouth every evening. ) 90 capsule 1  . guaiFENesin (MUCINEX) 600 MG 12 hr tablet Take 600 mg by mouth as needed for cough or to loosen phlegm.     Marland Kitchen ipratropium-albuterol (DUONEB) 0.5-2.5 (3) MG/3ML SOLN USE 1 VIAL (3ML) BY NEBULIZER EVERY 4 TO 6 HOURS AS NEEDED (Patient taking differently: Inhale 3 mLs into the lungs every 4 (four) hours as needed (shortness of breath). ) 270 mL 2  . losartan (COZAAR) 50 MG tablet Take 50 mg by mouth every evening.     . Melatonin 1 MG CAPS Take 1 mg by mouth at bedtime as needed (sleep).    . montelukast  (SINGULAIR) 10 MG tablet TAKE 1 TABLET (10 MG TOTAL) BY MOUTH AT BEDTIME. 60 tablet 2  . potassium chloride SA (KLOR-CON M20) 20 MEQ tablet Take 20 mEq by mouth 2 (two) times daily.    Marland Kitchen Spacer/Aero-Holding Chambers (AEROCHAMBER MV) inhaler Use as instructed with Symbicort 1 each 0  . spironolactone (ALDACTONE) 25 MG tablet Take 1 tablet (25 mg total) by mouth at bedtime. 30 tablet 3  . tiotropium (SPIRIVA) 18 MCG inhalation capsule Place 18 mcg into inhaler and inhale daily as needed (shortness of breath).     . torsemide (DEMADEX) 20 MG tablet Take 3 tablets (60 mg total) by mouth 2 (two) times daily. 180 tablet 3  . VENTOLIN HFA 108 (90  Base) MCG/ACT inhaler INHALE 1-2 PUFFS BY MOUTH INTO THE LUNGS EVERY 6 (SIX) HOURS AS NEEDED FOR WHEEZING OR SHORTNESS OF BREATH. (Patient taking differently: Inhale 2 puffs into the lungs every 6 (six) hours as needed for wheezing or shortness of breath. ) 18 g 5   No current facility-administered medications for this encounter.    Allergies  Allergen Reactions  . Prednisone Shortness Of Breath    CHF     Social History   Socioeconomic History  . Marital status: Married    Spouse name: Not on file  . Number of children: 2  . Years of education: Not on file  . Highest education level: Not on file  Occupational History  . Occupation: [employer]    Employer: [employer]  Tobacco Use  . Smoking status: Former Smoker    Packs/day: 0.50    Years: 30.00    Pack years: 15.00    Types: Cigarettes    Quit date: 07/12/2018    Years since quitting: 0.9  . Smokeless tobacco: Never Used  Vaping Use  . Vaping Use: Never used  Substance and Sexual Activity  . Alcohol use: Yes    Alcohol/week: 2.0 standard drinks    Types: 2 Glasses of wine per week  . Drug use: No  . Sexual activity: Never  Other Topics Concern  . Not on file  Social History Narrative   ** Merged History Encounter **       Social Determinants of Health   Financial  Resource Strain:   . Difficulty of Paying Living Expenses:   Food Insecurity:   . Worried About Charity fundraiser in the Last Year:   . Arboriculturist in the Last Year:   Transportation Needs:   . Film/video editor (Medical):   Marland Kitchen Lack of Transportation (Non-Medical):   Physical Activity:   . Days of Exercise per Week:   . Minutes of Exercise per Session:   Stress:   . Feeling of Stress :   Social Connections:   . Frequency of Communication with Friends and Family:   . Frequency of Social Gatherings with Friends and Family:   . Attends Religious Services:   . Active Member of Clubs or Organizations:   . Attends Archivist Meetings:   Marland Kitchen Marital Status:   Intimate Partner Violence:   . Fear of Current or Ex-Partner:   . Emotionally Abused:   Marland Kitchen Physically Abused:   . Sexually Abused:     Family History  Problem Relation Age of Onset  . Heart disease Sister   . Dementia Mother   . Cancer Sister 10       uterine  . Diabetes Other   . Cardiomyopathy Sister   . Diabetes Brother   . Colon cancer Neg Hx   . Esophageal cancer Neg Hx   . Stomach cancer Neg Hx   . Rectal cancer Neg Hx     ROS- All systems are reviewed and negative except as per the HPI above  Physical Exam: Vitals:   06/19/19 0916  BP: (!) 94/56  Pulse: 63  Weight: 55.6 kg  Height: 5\' 2"  (1.575 m)   Wt Readings from Last 3 Encounters:  06/19/19 55.6 kg  06/05/19 54.4 kg  05/25/19 56.2 kg    Labs: Lab Results  Component Value Date   NA 146 (H) 06/05/2019   NA 143 06/05/2019   K 2.8 (L) 06/05/2019   K  3.3 (L) 06/05/2019   CL 101 05/25/2019   CO2 25 05/25/2019   GLUCOSE 85 05/25/2019   BUN 21 05/25/2019   CREATININE 1.10 (H) 05/25/2019   CALCIUM 8.8 (L) 05/25/2019   PHOS 2.0 (L) 06/24/2017   MG 2.1 01/26/2019   Lab Results  Component Value Date   INR 1.06 09/18/2017   Lab Results  Component Value Date   CHOL 193 07/15/2018   HDL 60 07/15/2018   LDLCALC 118 (H)  07/15/2018   TRIG 63 07/15/2018     GEN- The patient is well appearing, alert and oriented x 3 today.   Head- normocephalic, atraumatic Eyes-  Sclera clear, conjunctiva pink Ears- hearing intact Oropharynx- clear Neck- supple, no JVP Lymph- no cervical lymphadenopathy Lungs- Clear to ausculation bilaterally, normal work of breathing Heart- Regular rate and rhythm, no murmurs, rubs or gallops, PMI not laterally displaced GI- soft, NT, ND, + BS Extremities- no clubbing, cyanosis, or edema MS- no significant deformity or atrophy Skin- no rash or lesion Psych- euthymic mood, full affect Neuro- strength and sensation are intact  EKG-AV paced at 63 bpm    Assessment and Plan: 1. Paroxysmal afib Pt has had a very low afib burden since last ablation 12/2018 Dofetilide was erroneously added to med list  when it was being discussed with  her starting it last fall, she has never been on the drug Discussed with Dr. Curt Bears and he feels her afib burden currently does  not warrant tikosyn  Pt informed that no change in afib management will  be made currently   2. CHA2DS2VASc of least  4  Continue eliquis 5 mg bid   3. HOCM/severe LV dysfunction Per Dr. Haroldine Laws  Has f/u with him 6/21   4. BiV pacer Per Dr. Gaynelle Cage C. Carlie Corpus, [facility] [address] [place], [place] [address] [phone]

## 2019-06-28 NOTE — Progress Notes (Signed)
Advanced HF Clinic Note   Date:  06/28/2019   ID:  Angela Shepherd, DOB 01-23-1950, MRN [id number]  Location: Home  Provider location: [facility] Type of Visit: Established patient  PCP:  Angela Minium, MD  Cardiologist:  No primary care provider on file. Primary HF: Angela Shepherd  Chief Complaint: Heart Failure follow-up   History of Present Illness:  Angela Shepherd is a 69 y.o. 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 PMHx also notable for HTN and prior tobacco abuse.  Was admitted in December 2011 with acute HF. Echo with EF 30-35%, There was also evidence of ASH with septum of 1.7 PW = 1.9.Cath 12/2009: Normal coronaries. EF 40-45%.    Was seen by EP and noted to have a high percentage of RV pacing (99%) and it was felt this was worsening LV function. Underwent upgrade to [facility]. Jude CRT-P in 5/14.    Seen by [facility] in [facility] and found to be carrier for HOCM. No LVOT obstruction clinically.  Admitted in 6/19 for syncope with SBP 71/59. Angela Shepherd and losartan stopped. Echo with EF 35-40%  She also has atrial fibrillation and had AF ablation x 2 with Dr. Curt Bears 11/18/2017. & 12/26/18.  Admitted in [facility] in 5/21 with acute respiratory failure and delirium.Intubated. With lactic acid 5.3. Found to have R-sided PNA and pulmory edema. Treated with abx and IV lasix. Tikosyn stopped. Echo EF 20-25%.   Cath 06/05/19 No CAD. EF 20% Ao = 92/60 LV =  89/19 RA = 10 RV = 68/12 PA = 64/25 (39) PCW = 23 Fick cardiac output/index = 2.5/1.6 PVR = 6.5 WU Ao sat = 99% PA sat = 57%, 58% PAPi = 3.9  Assessment: 1. Normal coronary arteries 2. Severe NICM with EF 20% 3. Mildly elevated filling pressures with severely depressed cardiac output  Here for post-cath f/u to discuss options and possible advanced therapies.  Says she feels tired. Not much energy. SOB just walking 50 yards. No CP. Mild ab edema. No ankle edema. Chronic 2 pillow edema. No PND. No cigarettes since 07/13/18. Weight 118-122. On torsemide 60 bid    PFTs 7/19 FEV1 0.77L (37%) FVC 1.30L (48%) DLCO 41%    Cardiac studies Echo in March 2012:  EF 50-55%. Grade 2 diastolic dysfunction. IVS 1.4cm.  Echo 4/14: EF 40-45%  Echo 4/16: EF 45-50% Echo 6/19 EF 35-40% Mild LVH no LVOT obstruction    Past Medical History:  Diagnosis Date  . Bradycardia    a. initial PPM placed 1994 at [facility] with gen change 2002. b. 1*AVB & intermittent 2nd degree AVB + CHF --> upgrade to [facility]. Jude BiV PPM 05/08/12.  . Chronic systolic CHF (congestive heart failure) ([place])    a. EF 30-35% dx in 2011 -> most recent 40-45% by echo 04/2012. b. s/p upgrade to BiV-PPM 05/08/12. c. Med titration limited by hypotension.  . Cluster headache syndrome   . Hypertr obst cardiomyop   . Hypokalemia   . Hypotension   . Paroxysmal atrial fibrillation (HCC)    a. identified on PPM interrogation b. longest episode 1 hour; if burden increases, will need [place]   . Pneumonia 2008  . Sleep apnea    Past Surgical History:  Procedure Laterality Date  . ATRIAL FIBRILLATION ABLATION N/A 11/15/2017   Procedure: ATRIAL FIBRILLATION ABLATION;  Surgeon:  Constance Haw, MD;  Location: [place] CV LAB;  Service: Cardiovascular;  Laterality: N/A;  . ATRIAL FIBRILLATION ABLATION N/A 12/26/2018   Procedure: ATRIAL FIBRILLATION ABLATION;  Surgeon: Constance Haw, MD;  Location: [place] CV LAB;  Service: Cardiovascular;  Laterality: N/A;  . BI-VENTRICULAR PACEMAKER UPGRADE N/A 05/08/2012   upgrade to [facility] (CRT-P) by Dr Rayann Heman  . CARDIOVERSION N/A 09/19/2017   Procedure: CARDIOVERSION;  Surgeon: Sanda Klein, MD;  Location: [place] ENDOSCOPY;  Service: Cardiovascular;  Laterality: N/A;  . CARDIOVERSION N/A 10/01/2018   Procedure: CARDIOVERSION;  Surgeon: Donato Heinz,  MD;  Location: [facility] ENDOSCOPY;  Service: Endoscopy;  Laterality: N/A;  . PACEMAKER INSERTION  1994; 2002; 05/08/2012  . RIGHT/LEFT HEART CATH AND CORONARY ANGIOGRAPHY N/A 06/05/2019   Procedure: RIGHT/LEFT HEART CATH AND CORONARY ANGIOGRAPHY;  Surgeon: Jolaine Artist, MD;  Location: [place] CV LAB;  Service: Cardiovascular;  Laterality: N/A;  . TONSILLECTOMY  ~ 1959  . TUBAL LIGATION  1984?     Current Outpatient Medications  Medication Sig Dispense Refill  . allopurinol (ZYLOPRIM) 100 MG tablet TAKE 1/2 TABLET (50 MG TOTAL) BY MOUTH DAILY. (Patient taking differently: Take 50 mg by mouth daily. ) 15 tablet 1  . ALPRAZolam (XANAX) 0.25 MG tablet TAKE 1 TABLET BY MOUTH TWICE DAILY AS NEEDED FOR ANXIETY 30 tablet 1  . AMBULATORY NON FORMULARY MEDICATION Medication Name: 100% O2 15 leters a min for 15-20 mins  Via Nasal cannual 1 Bottle 2  . amitriptyline (ELAVIL) 10 MG tablet Take 10 mg by mouth daily as needed (migraine).     . benzonatate (TESSALON) 200 MG capsule Take 200 mg by mouth as needed.     . bisoprolol (ZEBETA) 5 MG tablet Take 5 mg by mouth daily.    . budesonide-formoterol (SYMBICORT) 160-4.5 MCG/ACT inhaler Inhale 2 puffs into the lungs 2 (two) times daily. 1 Inhaler 5  . colchicine 0.6 MG tablet Take 1 tablet (0.6 mg total) by mouth 2 (two) times daily as needed (for gout flare ups.). 60 tablet 1  . diphenhydrAMINE (SOMINEX) 25 MG tablet Take 25 mg by mouth at bedtime as needed for sleep.     Marland Kitchen ELIQUIS 5 MG TABS tablet TAKE 1 TABLET BY MOUTH TWICE DAILY (Patient taking differently: Take 5 mg by mouth 2 (two) times daily. ) 60 tablet 2  . FLUoxetine (PROZAC) 20 MG capsule TAKE 1 CAPSULE (20 MG TOTAL) BY MOUTH DAILY. (Patient taking differently: Take 20 mg by mouth every evening. ) 90 capsule 1  . guaiFENesin (MUCINEX) 600 MG 12 hr tablet Take 600 mg by mouth as needed for cough or to loosen phlegm.     Marland Kitchen ipratropium-albuterol (DUONEB) 0.5-2.5 (3) MG/3ML SOLN USE 1 VIAL (3ML)  BY NEBULIZER EVERY 4 TO 6 HOURS AS NEEDED (Patient taking differently: Inhale 3 mLs into the lungs every 4 (four) hours as needed (shortness of breath). ) 270 mL 2  . losartan (COZAAR) 50 MG tablet Take 25 mg by mouth every evening.     . Melatonin 1 MG CAPS Take 1 mg by mouth at bedtime as needed (sleep).    . montelukast (SINGULAIR) 10 MG tablet TAKE 1 TABLET (10 MG TOTAL) BY MOUTH AT BEDTIME. 60 tablet 2  . potassium chloride SA (KLOR-CON M20) 20 MEQ tablet Take 20 mEq by mouth 2 (two) times daily.    Marland Kitchen Spacer/Aero-Holding Chambers (AEROCHAMBER MV) inhaler Use as instructed with Symbicort 1 each 0  . spironolactone (ALDACTONE)  25 MG tablet Take 1 tablet (25 mg total) by mouth at bedtime. 30 tablet 3  . tiotropium (SPIRIVA) 18 MCG inhalation capsule Place 18 mcg into inhaler and inhale daily as needed (shortness of breath).     . torsemide (DEMADEX) 20 MG tablet Take 3 tablets (60 mg total) by mouth 2 (two) times daily. 180 tablet 3  . VENTOLIN HFA 108 (90 Base) MCG/ACT inhaler INHALE 1-2 PUFFS BY MOUTH INTO THE LUNGS EVERY 6 (SIX) HOURS AS NEEDED FOR WHEEZING OR SHORTNESS OF BREATH. (Patient taking differently: Inhale 2 puffs into the lungs every 6 (six) hours as needed for wheezing or shortness of breath. ) 18 g 5   No current facility-administered medications for this encounter.    Allergies:   Prednisone   Social History:  The patient  reports that she quit smoking about a year ago. Her smoking use included cigarettes. She has a 15.00 pack-year smoking history. She has never used smokeless tobacco. She reports current alcohol use of about 2.0 standard drinks of alcohol per week. She reports that she does not use drugs.   Family History:  The patient's family history includes Cancer (age of onset: 40) in her sister; Cardiomyopathy in her sister; Dementia in her mother; Diabetes in her brother and another family member; Heart disease in her sister.   ROS:  Please see the history of present  illness.   All other systems are personally reviewed and negative.   Vitals:   06/29/19 1450  BP: 114/84  Pulse: 72  SpO2: 94%  Weight: 56.2 kg (124 lb)     Exam:   General:  Weak appearing. No resp difficulty HEENT: normal Neck: supple. no JVD. Carotids 2+ bilat; no bruits. No lymphadenopathy or thryomegaly appreciated. Cor: PMI nondisplaced. Regular rate & rhythm. No rubs, gallops or murmurs. Lungs: Markedly decreased throguhout Abdomen: soft, nontender, nondistended. No hepatosplenomegaly. No bruits or masses. Good bowel sounds. Extremities: no cyanosis, clubbing, rash, edema Neuro: alert & orientedx3, cranial nerves grossly intact. moves all 4 extremities w/o difficulty. Affect pleasant    Recent Labs: 07/15/2018: ALT 16; TSH 1.46 01/26/2019: Magnesium 2.1 05/25/2019: B Natriuretic Peptide 1,838.4; BUN 21; Creatinine, Ser 1.10; Platelets 221 06/05/2019: Hemoglobin 12.2; Hemoglobin 13.6; Potassium 2.8; Potassium 3.3; Sodium 146; Sodium 143  Personally reviewed   Wt Readings from Last 3 Encounters:  06/19/19 55.6 kg (122 lb 9.6 oz)  06/05/19 54.4 kg (120 lb)  05/25/19 56.2 kg (124 lb)      ASSESSMENT AND PLAN:  1. Chronic systolic CHF: EF 42% s/p CRT-P upgrade 05/2012 (due to chronic RV pacing)  - Echo 6/19: LVEF 35-40%, mild MR, Mild TR - She is s/p recent hospitalization 5/21 for what sounds like a COPD flare requiring intubation. Had some concomittant HF - Echo in [place], [place] 5/21 EF 20-25% - Cath 5/21. No CAD. EF 20% CI 1.6 - She has deteriorated significantly over last few months. Now NYHA IIIb. Cath with low output.  - Volume status looks Ok on exam but REDS 55% - She is now Class D HF and we discussed potential need for advanced therapies but lung disease is prohibitive with FEV1 0.77.  - Will repeat PFTs to see if this has changed any.  - Change torsemide to 40 mg BID - Bisoprolol stopped due to recent resp failure and low output - Continue losartan 50 daily    - Continue spiro 25 daily.  - Add Farxiga 10 - Add digoxin 0.125mg  daily - Labs today  2. H/o HOCM  - Stable on most recent Echo. No LVOT gradient - Has been seen in [facility]. All 1st degree relatives have been tested  3. Tobacco use/COPD - Says she has been abstinent since 7/20 - Repeat PFTs  4. Persistent AF/AFL - Follows with Dr. Curt Bears.  - Had repeat AF ablation with Dr. Curt Bears 12/26/18.  - remains in NSR - Continue Eliquis. She has not had bleeding  - Labs today  Total time spent 35 minutes. Over half that time spent discussing above.    Signed, Glori Bickers, MD  06/28/2019 10:35 PM  Advanced [facility] Failure [place] [address] Heart and [place] [address] [phone] (office) [phone] (fax)

## 2019-06-29 ENCOUNTER — Encounter (HOSPITAL_COMMUNITY): Payer: Self-pay | Admitting: Internal Medicine

## 2019-06-29 ENCOUNTER — Ambulatory Visit (HOSPITAL_COMMUNITY)
Admission: RE | Admit: 2019-06-29 | Discharge: 2019-06-29 | Disposition: A | Discharge status: Home / Self Care | Payer: 59 | Source: Ambulatory Visit | Attending: Internal Medicine | Admitting: Internal Medicine

## 2019-06-29 ENCOUNTER — Other Ambulatory Visit (HOSPITAL_COMMUNITY): Payer: Self-pay | Admitting: Internal Medicine

## 2019-06-29 ENCOUNTER — Other Ambulatory Visit: Payer: Self-pay

## 2019-06-29 VITALS — BP 114/84 | HR 72 | Wt 124.0 lb

## 2019-06-29 DIAGNOSIS — Z79899 Other long term (current) drug therapy: Secondary | ICD-10-CM | POA: Insufficient documentation

## 2019-06-29 DIAGNOSIS — I5022 Chronic systolic (congestive) heart failure: Secondary | ICD-10-CM | POA: Diagnosis not present

## 2019-06-29 DIAGNOSIS — Z950 Presence of cardiac pacemaker: Secondary | ICD-10-CM | POA: Diagnosis not present

## 2019-06-29 DIAGNOSIS — I5042 Chronic combined systolic (congestive) and diastolic (congestive) heart failure: Secondary | ICD-10-CM

## 2019-06-29 DIAGNOSIS — I110 Hypertensive heart disease with heart failure: Secondary | ICD-10-CM | POA: Insufficient documentation

## 2019-06-29 DIAGNOSIS — Z87891 Personal history of nicotine dependence: Secondary | ICD-10-CM | POA: Insufficient documentation

## 2019-06-29 DIAGNOSIS — J449 Chronic obstructive pulmonary disease, unspecified: Secondary | ICD-10-CM | POA: Insufficient documentation

## 2019-06-29 DIAGNOSIS — Z7901 Long term (current) use of anticoagulants: Secondary | ICD-10-CM | POA: Insufficient documentation

## 2019-06-29 DIAGNOSIS — I4892 Unspecified atrial flutter: Secondary | ICD-10-CM | POA: Insufficient documentation

## 2019-06-29 DIAGNOSIS — I421 Obstructive hypertrophic cardiomyopathy: Secondary | ICD-10-CM | POA: Insufficient documentation

## 2019-06-29 DIAGNOSIS — Z8249 Family history of ischemic heart disease and other diseases of the circulatory system: Secondary | ICD-10-CM | POA: Diagnosis not present

## 2019-06-29 DIAGNOSIS — G4733 Obstructive sleep apnea (adult) (pediatric): Secondary | ICD-10-CM | POA: Insufficient documentation

## 2019-06-29 DIAGNOSIS — Z888 Allergy status to other drugs, medicaments and biological substances status: Secondary | ICD-10-CM | POA: Diagnosis not present

## 2019-06-29 DIAGNOSIS — Z833 Family history of diabetes mellitus: Secondary | ICD-10-CM | POA: Diagnosis not present

## 2019-06-29 DIAGNOSIS — Z7951 Long term (current) use of inhaled steroids: Secondary | ICD-10-CM | POA: Insufficient documentation

## 2019-06-29 DIAGNOSIS — I4819 Other persistent atrial fibrillation: Secondary | ICD-10-CM | POA: Insufficient documentation

## 2019-06-29 LAB — CBC
HCT: 44.2 % (ref 36.0–46.0)
Hemoglobin: 14 g/dL (ref 12.0–15.0)
MCH: 31 pg (ref 26.0–34.0)
MCHC: 31.7 g/dL (ref 30.0–36.0)
MCV: 97.8 fL (ref 80.0–100.0)
Platelets: 190 10*3/uL (ref 150–400)
RBC: 4.52 MIL/uL (ref 3.87–5.11)
RDW: 18.9 % — ABNORMAL HIGH (ref 11.5–15.5)
WBC: 5.6 10*3/uL (ref 4.0–10.5)
nRBC: 0 % (ref 0.0–0.2)

## 2019-06-29 LAB — BASIC METABOLIC PANEL
Anion gap: 13 (ref 5–15)
BUN: 25 mg/dL — ABNORMAL HIGH (ref 8–23)
CO2: 23 mmol/L (ref 22–32)
Calcium: 9.3 mg/dL (ref 8.9–10.3)
Chloride: 103 mmol/L (ref 98–111)
Creatinine, Ser: 1.34 mg/dL — ABNORMAL HIGH (ref 0.44–1.00)
GFR calc Af Amer: 47 mL/min — ABNORMAL LOW (ref >60)
GFR calc non Af Amer: 41 mL/min — ABNORMAL LOW (ref >60)
Glucose, Bld: 83 mg/dL (ref 70–99)
Potassium: 4.2 mmol/L (ref 3.5–5.1)
Sodium: 139 mmol/L (ref 135–145)

## 2019-06-29 LAB — BRAIN NATRIURETIC PEPTIDE: B Natriuretic Peptide: 1860.2 pg/mL — ABNORMAL HIGH (ref 0.0–100.0)

## 2019-06-29 MED ORDER — TORSEMIDE 20 MG PO TABS: 40.0000 mg | ORAL_TABLET | Freq: Two times a day (BID) | ORAL | 3 refills | Status: DC

## 2019-06-29 MED ORDER — DAPAGLIFLOZIN PROPANEDIOL 10 MG PO TABS
10.0000 mg | ORAL_TABLET | Freq: Every day | ORAL | 6 refills | Status: DC
Start: 2019-06-29 — End: 2020-02-22

## 2019-06-29 MED ORDER — DIGOXIN 125 MCG PO TABS
0.1250 mg | ORAL_TABLET | Freq: Every day | ORAL | 6 refills | Status: DC
Start: 2019-06-29 — End: 2020-03-17

## 2019-06-29 MED ORDER — SPIRONOLACTONE 25 MG PO TABS: 25.0000 mg | ORAL_TABLET | Freq: Every day | ORAL | 6 refills | Status: DC

## 2019-06-29 MED FILL — DIGOXIN 0.125 MG TABLET: 125 | 30 days supply | Qty: 30 | Fill #0

## 2019-06-29 MED FILL — SPIRONOLACTONE 25 MG TABS: 25 | 30 days supply | Qty: 30 | Fill #0

## 2019-06-29 NOTE — Progress Notes (Signed)
ReDS Vest / Clip - 06/29/19 1500      ReDS Vest / Clip   Station Marker A    Ruler Value 21    ReDS Value Range High volume overload    ReDS Actual Value 55

## 2019-06-29 NOTE — Patient Instructions (Signed)
Decrease Torsemide to 40 mg (2 tabs) Twice daily   Restart Spironolactone 25 mg Daily  Start Digoxin 0.125 mg Daily  Start Farxiga 10 mg Daily  Labs done today, your results will be available in MyChart, we will contact you for abnormal readings.  Your physician has recommended that you have a pulmonary function test. Pulmonary Function Tests are a group of tests that measure how well air moves in and out of your lungs.  Your physician recommends that you schedule a follow-up appointment in: 6 weeks  If you have any questions or concerns before your next appointment please send [place] a message through [place] or call our office at [phone].    TO LEAVE A MESSAGE FOR THE NURSE SELECT OPTION 2, PLEASE LEAVE A MESSAGE INCLUDING: . YOUR NAME . DATE OF BIRTH . CALL BACK NUMBER . REASON FOR CALL**this is important as we prioritize the call backs  [place] AS LONG AS YOU CALL BEFORE 4:00 PM  At the [facility], you and your health needs are our priority. As part of our continuing mission to provide you with exceptional heart care, we have created designated Provider Care Teams. These Care Teams include your primary Cardiologist (physician) and Advanced Practice Providers (APPs- Physician Assistants and Nurse Practitioners) who all work together to provide you with the care you need, when you need it.   You may see any of the following providers on your designated Care Team at your next follow up: Marland Kitchen Dr Glori Bickers . Dr Loralie Champagne . Darrick Grinder, NP . Lyda Jester, PA . Audry Riles, PharmD   Please be sure to bring in all your medications bottles to every appointment.

## 2019-06-30 MED FILL — ALPRAZolam 0.25 MG TABS: 0.25 | 15 days supply | Qty: 30 | Fill #1

## 2019-07-01 MED FILL — FARXIGA 10 MG TABLET: 10 | 30 days supply | Qty: 30 | Fill #0

## 2019-07-06 MED FILL — TORSEMIDE 20 MG TABLET: 20 | 30 days supply | Qty: 180 | Fill #0

## 2019-07-09 ENCOUNTER — Encounter (HOSPITAL_COMMUNITY): Payer: Self-pay | Admitting: *Deleted

## 2019-07-17 ENCOUNTER — Encounter: Payer: 59 | Admitting: Family Medicine

## 2019-07-17 MED FILL — ELIQUIS 5 MG TABLET: 5 | 30 days supply | Qty: 60 | Fill #1

## 2019-07-20 ENCOUNTER — Telehealth (HOSPITAL_COMMUNITY): Payer: Self-pay

## 2019-07-20 NOTE — Telephone Encounter (Signed)
Patients FMLA paperwork was successfully faxed to [phone]. Patient was notified and will pick up the original copy when she comes in for appointment on the 28th. A copy of these forms will also be scanned into the patients chart.

## 2019-07-21 ENCOUNTER — Other Ambulatory Visit (HOSPITAL_COMMUNITY)
Admission: RE | Admit: 2019-07-21 | Discharge: 2019-07-21 | Disposition: A | Discharge status: Home / Self Care | Payer: 59 | Source: Ambulatory Visit | Attending: Internal Medicine | Admitting: Internal Medicine

## 2019-07-21 DIAGNOSIS — Z20822 Contact with and (suspected) exposure to covid-19: Secondary | ICD-10-CM | POA: Insufficient documentation

## 2019-07-21 LAB — SARS CORONAVIRUS 2 (TAT 6-24 HRS): SARS Coronavirus 2: NEGATIVE

## 2019-07-24 ENCOUNTER — Ambulatory Visit (HOSPITAL_COMMUNITY)
Admission: RE | Admit: 2019-07-24 | Discharge: 2019-07-24 | Disposition: A | Discharge status: Home / Self Care | Payer: 59 | Source: Ambulatory Visit | Attending: Internal Medicine | Admitting: Internal Medicine

## 2019-07-24 ENCOUNTER — Encounter: Payer: Self-pay | Admitting: Family Medicine

## 2019-07-24 ENCOUNTER — Ambulatory Visit (INDEPENDENT_AMBULATORY_CARE_PROVIDER_SITE_OTHER): Payer: 59 | Admitting: Family Medicine

## 2019-07-24 ENCOUNTER — Other Ambulatory Visit: Payer: Self-pay

## 2019-07-24 VITALS — BP 92/67 | HR 67 | Temp 97.9°F | Resp 16 | Ht 62.0 in | Wt 120.4 lb

## 2019-07-24 DIAGNOSIS — Z1231 Encounter for screening mammogram for malignant neoplasm of breast: Secondary | ICD-10-CM

## 2019-07-24 DIAGNOSIS — J449 Chronic obstructive pulmonary disease, unspecified: Secondary | ICD-10-CM | POA: Insufficient documentation

## 2019-07-24 DIAGNOSIS — I10 Essential (primary) hypertension: Secondary | ICD-10-CM

## 2019-07-24 DIAGNOSIS — M810 Age-related osteoporosis without current pathological fracture: Secondary | ICD-10-CM

## 2019-07-24 DIAGNOSIS — Z0000 Encounter for general adult medical examination without abnormal findings: Secondary | ICD-10-CM | POA: Diagnosis not present

## 2019-07-24 DIAGNOSIS — I5042 Chronic combined systolic (congestive) and diastolic (congestive) heart failure: Secondary | ICD-10-CM | POA: Diagnosis not present

## 2019-07-24 DIAGNOSIS — R748 Abnormal levels of other serum enzymes: Secondary | ICD-10-CM | POA: Diagnosis not present

## 2019-07-24 LAB — PULMONARY FUNCTION TEST
DL/VA % pred: 62 %
DL/VA: 2.68 ml/min/mmHg/L
DLCO cor % pred: 41 %
DLCO cor: 7.3 ml/min/mmHg
DLCO unc % pred: 42 %
DLCO unc: 7.43 ml/min/mmHg
FEF 25-75 Post: 0.62 L/sec
FEF 25-75 Pre: 0.38 L/sec
FEF2575-%Change-Post: 62 %
FEF2575-%Pred-Post: 33 %
FEF2575-%Pred-Pre: 20 %
FEV1-%Change-Post: 21 %
FEV1-%Pred-Post: 52 %
FEV1-%Pred-Pre: 42 %
FEV1-Post: 1.06 L
FEV1-Pre: 0.88 L
FEV1FVC-%Change-Post: 20 %
FEV1FVC-%Pred-Pre: 73 %
FEV6-%Change-Post: 2 %
FEV6-%Pred-Post: 61 %
FEV6-%Pred-Pre: 59 %
FEV6-Post: 1.58 L
FEV6-Pre: 1.53 L
FEV6FVC-%Change-Post: 1 %
FEV6FVC-%Pred-Post: 104 %
FEV6FVC-%Pred-Pre: 102 %
FVC-%Change-Post: 1 %
FVC-%Pred-Post: 58 %
FVC-%Pred-Pre: 58 %
FVC-Post: 1.58 L
FVC-Pre: 1.56 L
Post FEV1/FVC ratio: 67 %
Post FEV6/FVC ratio: 100 %
Pre FEV1/FVC ratio: 56 %
Pre FEV6/FVC Ratio: 98 %
RV % pred: 103 %
RV: 2.02 L
TLC % pred: 84 %
TLC: 3.85 L

## 2019-07-24 MED ORDER — ALBUTEROL SULFATE (2.5 MG/3ML) 0.083% IN NEBU
2.5000 mg | INHALATION_SOLUTION | Freq: Once | RESPIRATORY_TRACT | Status: AC
  Administered 2019-07-24: 2.5 mg via RESPIRATORY_TRACT

## 2019-07-24 NOTE — Assessment & Plan Note (Signed)
Chronic problem.  Pt's BP is actually on the low side but she is asymptomatic.  Check labs.  Will defer to Cardiology

## 2019-07-24 NOTE — Patient Instructions (Addendum)
Follow up in 6 months to recheck BP We'll notify you of your lab results and make any changes if needed Keep up the good work!  You look great! They should call you to schedule your mammogram at [facility] Call with any questions or concerns Have a great summer!!

## 2019-07-24 NOTE — Assessment & Plan Note (Addendum)
UTD on colonoscopy, immunizations.  Due for mammo- ordered.  Declines DEXA at this time.  PE unchanged from previous.  Check labs.  Anticipatory guidance provided.

## 2019-07-24 NOTE — Assessment & Plan Note (Signed)
Pt declines DEXA today.  Check Vit D level.

## 2019-07-24 NOTE — Progress Notes (Signed)
   Subjective:    Patient ID: Angela Shepherd, female    DOB: [birth year], 69 y.o.   MRN: [id number]  HPI CPE- due for mammo and DEXA.  UTD on flu, Tdap.  UTD on pneumonia vaccines.  Pt reports 'feeling good' despite having worsening heart failure and discussion of possible transplant vs LVAD.  'as long as I can breathe, i'm fine'.  Reviewed past medical, surgical, family and social histories.    Review of Systems Patient reports no vision/ hearing changes, adenopathy,fever, weight change,  persistant/recurrent hoarseness , swallowing issues, chest pain, palpitations, edema, persistant/recurrent cough, hemoptysis, dyspnea (rest/exertional/paroxysmal nocturnal), gastrointestinal bleeding (melena, rectal bleeding), abdominal pain, significant heartburn, bowel changes, GU symptoms (dysuria, hematuria, incontinence), Gyn symptoms (abnormal  bleeding, pain),  syncope, focal weakness, memory loss, numbness & tingling, skin/hair/nail changes, abnormal bruising or bleeding, anxiety, or depression.   This visit occurred during the SARS-CoV-2 public health emergency.  Safety protocols were in place, including screening questions prior to the visit, additional usage of staff PPE, and extensive cleaning of exam room while observing appropriate contact time as indicated for disinfecting solutions.       Objective:   Physical Exam General Appearance:    Alert, cooperative, no distress, appears stated age  Head:    Normocephalic, without obvious abnormality, atraumatic  Eyes:    PERRL, conjunctiva/corneas clear, EOM's intact, fundi    benign, both eyes  Ears:    Normal TM's and external ear canals, both ears  Nose:   Deferred due to COVID  Throat:   Neck:   Supple, symmetrical, trachea midline, no adenopathy;    Thyroid: no enlargement/tenderness/nodules  Back:     Symmetric, no curvature, ROM normal, no CVA tenderness  Lungs:     Clear to auscultation bilaterally, respirations unlabored  Chest  Wall:    No tenderness or deformity   Heart:    Distant heart sounds  Breast Exam:    Deferred to mammo  Abdomen:     Soft, non-tender, bowel sounds active all four quadrants,    no masses, no organomegaly  Genitalia:    Deferred  Rectal:    Extremities:   Extremities normal, atraumatic, no cyanosis or edema  Pulses:   2+ and symmetric all extremities  Skin:   Skin color, texture, turgor normal, no rashes or lesions  Lymph nodes:   Cervical, supraclavicular, and axillary nodes normal  Neurologic:   CNII-XII intact, normal strength, sensation and reflexes    throughout          Assessment & Plan:

## 2019-07-27 ENCOUNTER — Other Ambulatory Visit: Payer: Self-pay | Admitting: General Practice

## 2019-07-27 DIAGNOSIS — R7989 Other specified abnormal findings of blood chemistry: Secondary | ICD-10-CM

## 2019-07-27 MED ORDER — VITAMIN D (ERGOCALCIFEROL) 1.25 MG (50000 UNIT) PO CAPS
50000.0000 [IU] | ORAL_CAPSULE | ORAL | 0 refills | Status: DC
Start: 2019-07-27 — End: 2019-11-05

## 2019-07-27 MED FILL — VIT D2 1.25 MG (50,000 UNIT: 1.25 MG | 84 days supply | Qty: 12 | Fill #0

## 2019-07-29 ENCOUNTER — Other Ambulatory Visit: Payer: Self-pay | Admitting: General Practice

## 2019-07-29 DIAGNOSIS — R748 Abnormal levels of other serum enzymes: Secondary | ICD-10-CM

## 2019-07-29 DIAGNOSIS — R7989 Other specified abnormal findings of blood chemistry: Secondary | ICD-10-CM

## 2019-07-29 LAB — HEPATIC FUNCTION PANEL
AG Ratio: 1.4 (calc) (ref 1.0–2.5)
ALT: 25 U/L (ref 6–29)
AST: 29 U/L (ref 10–35)
Albumin: 4.3 g/dL (ref 3.6–5.1)
Alkaline phosphatase (APISO): 183 U/L — ABNORMAL HIGH (ref 37–153)
Bilirubin, Direct: 0.2 mg/dL (ref 0.0–0.2)
Globulin: 3.1 g/dL (calc) (ref 1.9–3.7)
Indirect Bilirubin: 0.7 mg/dL (calc) (ref 0.2–1.2)
Total Bilirubin: 0.9 mg/dL (ref 0.2–1.2)
Total Protein: 7.4 g/dL (ref 6.1–8.1)

## 2019-07-29 LAB — CBC WITH DIFFERENTIAL/PLATELET
Absolute Monocytes: 543 cells/uL (ref 200–950)
Basophils Absolute: 73 cells/uL (ref 0–200)
Basophils Relative: 1.2 %
Eosinophils Absolute: 110 cells/uL (ref 15–500)
Eosinophils Relative: 1.8 %
HCT: 46.9 % — ABNORMAL HIGH (ref 35.0–45.0)
Hemoglobin: 15.5 g/dL (ref 11.7–15.5)
Lymphs Abs: 1482 cells/uL (ref 850–3900)
MCH: 31.1 pg (ref 27.0–33.0)
MCHC: 33 g/dL (ref 32.0–36.0)
MCV: 94.2 fL (ref 80.0–100.0)
MPV: 11.2 fL (ref 7.5–12.5)
Monocytes Relative: 8.9 %
Neutro Abs: 3892 cells/uL (ref 1500–7800)
Neutrophils Relative %: 63.8 %
Platelets: 219 10*3/uL (ref 140–400)
RBC: 4.98 10*6/uL (ref 3.80–5.10)
RDW: 16.3 % — ABNORMAL HIGH (ref 11.0–15.0)
Total Lymphocyte: 24.3 %
WBC: 6.1 10*3/uL (ref 3.8–10.8)

## 2019-07-29 LAB — LIPID PANEL
Cholesterol: 211 mg/dL — ABNORMAL HIGH (ref <200)
HDL: 66 mg/dL (ref >=50)
LDL Cholesterol (Calc): 121 mg/dL (calc) — ABNORMAL HIGH
Non-HDL Cholesterol (Calc): 145 mg/dL (calc) — ABNORMAL HIGH (ref <130)
Total CHOL/HDL Ratio: 3.2 (calc) (ref <5.0)
Triglycerides: 125 mg/dL (ref <150)

## 2019-07-29 LAB — BASIC METABOLIC PANEL
BUN/Creatinine Ratio: 23 (calc) — ABNORMAL HIGH (ref 6–22)
BUN: 34 mg/dL — ABNORMAL HIGH (ref 7–25)
CO2: 25 mmol/L (ref 20–32)
Calcium: 9.7 mg/dL (ref 8.6–10.4)
Chloride: 97 mmol/L — ABNORMAL LOW (ref 98–110)
Creat: 1.45 mg/dL — ABNORMAL HIGH (ref 0.50–0.99)
Glucose, Bld: 76 mg/dL (ref 65–99)
Potassium: 4.1 mmol/L (ref 3.5–5.3)
Sodium: 138 mmol/L (ref 135–146)

## 2019-07-29 LAB — TEST AUTHORIZATION

## 2019-07-29 LAB — VITAMIN D 25 HYDROXY (VIT D DEFICIENCY, FRACTURES): Vit D, 25-Hydroxy: 9 ng/mL — ABNORMAL LOW (ref 30–100)

## 2019-07-29 LAB — TSH: TSH: 2.4 mIU/L (ref 0.40–4.50)

## 2019-07-29 LAB — GAMMA GT: GGT: 127 U/L — ABNORMAL HIGH (ref 3–65)

## 2019-07-29 MED FILL — FARXIGA 10 MG TABLET: 10 | 30 days supply | Qty: 30 | Fill #1

## 2019-07-29 MED FILL — DIGOXIN 0.125 MG TABLET: 125 | 30 days supply | Qty: 30 | Fill #1

## 2019-08-04 ENCOUNTER — Other Ambulatory Visit: Payer: Self-pay | Admitting: Family Medicine

## 2019-08-04 DIAGNOSIS — M810 Age-related osteoporosis without current pathological fracture: Secondary | ICD-10-CM

## 2019-08-04 NOTE — Progress Notes (Signed)
Pt asking for DEXA (order placed last year expired)

## 2019-08-05 ENCOUNTER — Ambulatory Visit (HOSPITAL_COMMUNITY)
Admission: RE | Admit: 2019-08-05 | Discharge: 2019-08-05 | Disposition: A | Discharge status: Home / Self Care | Payer: 59 | Source: Ambulatory Visit | Attending: Internal Medicine | Admitting: Internal Medicine

## 2019-08-05 ENCOUNTER — Encounter (HOSPITAL_COMMUNITY): Payer: Self-pay | Admitting: Internal Medicine

## 2019-08-05 ENCOUNTER — Other Ambulatory Visit: Payer: Self-pay

## 2019-08-05 VITALS — BP 100/75 | HR 64 | Wt 123.2 lb

## 2019-08-05 DIAGNOSIS — I480 Paroxysmal atrial fibrillation: Secondary | ICD-10-CM | POA: Insufficient documentation

## 2019-08-05 DIAGNOSIS — Z7984 Long term (current) use of oral hypoglycemic drugs: Secondary | ICD-10-CM | POA: Diagnosis not present

## 2019-08-05 DIAGNOSIS — I428 Other cardiomyopathies: Secondary | ICD-10-CM | POA: Diagnosis not present

## 2019-08-05 DIAGNOSIS — I5032 Chronic diastolic (congestive) heart failure: Secondary | ICD-10-CM

## 2019-08-05 DIAGNOSIS — Z87891 Personal history of nicotine dependence: Secondary | ICD-10-CM | POA: Insufficient documentation

## 2019-08-05 DIAGNOSIS — J449 Chronic obstructive pulmonary disease, unspecified: Secondary | ICD-10-CM | POA: Insufficient documentation

## 2019-08-05 DIAGNOSIS — I421 Obstructive hypertrophic cardiomyopathy: Secondary | ICD-10-CM | POA: Insufficient documentation

## 2019-08-05 DIAGNOSIS — Z7901 Long term (current) use of anticoagulants: Secondary | ICD-10-CM | POA: Insufficient documentation

## 2019-08-05 DIAGNOSIS — I5022 Chronic systolic (congestive) heart failure: Secondary | ICD-10-CM | POA: Diagnosis not present

## 2019-08-05 DIAGNOSIS — Z79899 Other long term (current) drug therapy: Secondary | ICD-10-CM | POA: Insufficient documentation

## 2019-08-05 DIAGNOSIS — I4819 Other persistent atrial fibrillation: Secondary | ICD-10-CM | POA: Diagnosis not present

## 2019-08-05 DIAGNOSIS — I5042 Chronic combined systolic (congestive) and diastolic (congestive) heart failure: Secondary | ICD-10-CM | POA: Diagnosis not present

## 2019-08-05 DIAGNOSIS — Z950 Presence of cardiac pacemaker: Secondary | ICD-10-CM | POA: Diagnosis not present

## 2019-08-05 DIAGNOSIS — I110 Hypertensive heart disease with heart failure: Secondary | ICD-10-CM | POA: Diagnosis not present

## 2019-08-05 LAB — CBC
HCT: 44.2 % (ref 36.0–46.0)
Hemoglobin: 14.2 g/dL (ref 12.0–15.0)
MCH: 31 pg (ref 26.0–34.0)
MCHC: 32.1 g/dL (ref 30.0–36.0)
MCV: 96.5 fL (ref 80.0–100.0)
Platelets: 228 10*3/uL (ref 150–400)
RBC: 4.58 MIL/uL (ref 3.87–5.11)
RDW: 15.9 % — ABNORMAL HIGH (ref 11.5–15.5)
WBC: 6.6 10*3/uL (ref 4.0–10.5)
nRBC: 0 % (ref 0.0–0.2)

## 2019-08-05 LAB — BASIC METABOLIC PANEL
Anion gap: 15 (ref 5–15)
BUN: 31 mg/dL — ABNORMAL HIGH (ref 8–23)
CO2: 24 mmol/L (ref 22–32)
Calcium: 9.7 mg/dL (ref 8.9–10.3)
Chloride: 99 mmol/L (ref 98–111)
Creatinine, Ser: 1.33 mg/dL — ABNORMAL HIGH (ref 0.44–1.00)
GFR calc Af Amer: 48 mL/min — ABNORMAL LOW (ref >60)
GFR calc non Af Amer: 41 mL/min — ABNORMAL LOW (ref >60)
Glucose, Bld: 59 mg/dL — ABNORMAL LOW (ref 70–99)
Potassium: 4.4 mmol/L (ref 3.5–5.1)
Sodium: 138 mmol/L (ref 135–145)

## 2019-08-05 LAB — BRAIN NATRIURETIC PEPTIDE: B Natriuretic Peptide: 1107.4 pg/mL — ABNORMAL HIGH (ref 0.0–100.0)

## 2019-08-05 LAB — DIGOXIN LEVEL: Digoxin Level: 0.7 ng/mL — ABNORMAL LOW (ref 0.8–2.0)

## 2019-08-05 MED FILL — SPIRONOLACTONE 25 MG TABS: 25 | 30 days supply | Qty: 30 | Fill #1

## 2019-08-05 NOTE — Progress Notes (Signed)
ReDS Vest / Clip - 08/05/19 1000      ReDS Vest / Clip   Station Marker --   A   Ruler Value 31    ReDS Value Range --   45   ReDS Actual Value --   45

## 2019-08-05 NOTE — Progress Notes (Signed)
Advanced HF Clinic Note   Date:  08/05/2019   ID:  AMBERA FEDELE, DOB 03-22-1950, MRN [id number]  Location: Home  Provider location: [place] [facility] Type of Visit: Established patient  PCP:  No primary care provider on file.  Cardiologist:  No primary care provider on file. Primary HF: Adri Schloss  Chief Complaint: Heart Failure follow-up   History of Present Illness:  Angela Shepherd is a 70 y.o. 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 PMHx also notable for HTN and prior tobacco abuse.  Was admitted in December 2011 with acute HF. Echo with EF 30-35%, There was also evidence of ASH with septum of 1.7 PW = 1.9.Cath 12/2009: Normal coronaries. EF 40-45%.    Was seen by EP and noted to have a high percentage of RV pacing (99%) and it was felt this was worsening LV function. Underwent upgrade to [facility]. Jude CRT-P in 5/14.    Seen by [facility] in [facility] and found to be carrier for HOCM. No LVOT obstruction clinically.  Admitted in 6/19 for syncope with SBP 71/59. Arlyce Harman and losartan stopped. Echo with EF 35-40%  She also has atrial fibrillation and had AF ablation x 2 with Dr. Curt Bears 11/18/2017. & 12/26/18.  Admitted in [facility] in 5/21 with acute respiratory failure and delirium.Intubated. With lactic acid 5.3. Found to have R-sided PNA and pulmory edema. Treated with abx and IV lasix. Tikosyn stopped. Echo EF 20-25%.   Cath 06/05/19 No CAD. EF 20% Ao = 92/60 LV =  89/19 RA = 10 RV = 68/12 PA = 64/25 (39) PCW = 23 Fick cardiac output/index = 2.5/1.6 PVR = 6.5 WU Ao sat = 99% PA sat = 57%, 58% PAPi = 3.9  Assessment: 1. Normal coronary arteries 2. Severe NICM with EF 20% 3. Mildly elevated filling pressures with severely depressed cardiac output  I saw her last month for post-cath f/u to discuss options and  possible advanced therapies. Had NYHA IIIb symptoms.Added digoxin, spiro and Farxiga.  PFTs ordered to see if she was candidate for advanced therapies. PFTs prohibitive for sternotomy. Here for routine f/u and feels great. Says she feels cured. Can do all ADLs. Breathing better. Not needing inhalers or having "anxiety" attacks. Complaint with all meds. SBP 90-100  REDS 45%   PFTs 07/24/19 FEV1 0.88L (42%) FVC 1.56L (58%) DLCO 41%    Cardiac studies Echo in March 2012:  EF 50-55%. Grade 2 diastolic dysfunction. IVS 1.4cm.  Echo 4/14: EF 40-45%  Echo 4/16: EF 45-50% Echo 6/19 EF 35-40% Mild LVH no LVOT obstruction    Past Medical History:  Diagnosis Date  . Bradycardia    a. initial PPM placed 1994 at [facility] with gen change 2002. b. 1*AVB & intermittent 2nd degree AVB + CHF --> upgrade to [facility]. Jude BiV PPM 05/08/12.  . Chronic systolic CHF (congestive heart failure) ([place])    a. EF 30-35% dx in 2011 -> most recent 40-45% by echo 04/2012. b. s/p upgrade to BiV-PPM 05/08/12. c. Med titration limited by hypotension.  . Cluster headache syndrome   . Hypertr obst cardiomyop   . Hypokalemia   . Hypotension   . Paroxysmal atrial fibrillation (HCC)    a. identified on PPM interrogation b. longest episode 1 hour; if burden increases, will need [place]   . Pneumonia 2008  . Sleep  apnea    Past Surgical History:  Procedure Laterality Date  . ATRIAL FIBRILLATION ABLATION N/A 11/15/2017   Procedure: ATRIAL FIBRILLATION ABLATION;  Surgeon: Constance Haw, MD;  Location: [place] CV LAB;  Service: Cardiovascular;  Laterality: N/A;  . ATRIAL FIBRILLATION ABLATION N/A 12/26/2018   Procedure: ATRIAL FIBRILLATION ABLATION;  Surgeon: Constance Haw, MD;  Location: [place] CV LAB;  Service: Cardiovascular;  Laterality: N/A;  . BI-VENTRICULAR PACEMAKER UPGRADE N/A 05/08/2012   upgrade to SJM Anthem (CRT-P) by Dr Rayann Heman  . CARDIOVERSION N/A 09/19/2017   Procedure: CARDIOVERSION;  Surgeon:  Sanda Klein, MD;  Location: [place] ENDOSCOPY;  Service: Cardiovascular;  Laterality: N/A;  . CARDIOVERSION N/A 10/01/2018   Procedure: CARDIOVERSION;  Surgeon: Donato Heinz, MD;  Location: [facility] ENDOSCOPY;  Service: Endoscopy;  Laterality: N/A;  . PACEMAKER INSERTION  1994; 2002; 05/08/2012  . RIGHT/LEFT HEART CATH AND CORONARY ANGIOGRAPHY N/A 06/05/2019   Procedure: RIGHT/LEFT HEART CATH AND CORONARY ANGIOGRAPHY;  Surgeon: Jolaine Artist, MD;  Location: [place] CV LAB;  Service: Cardiovascular;  Laterality: N/A;  . TONSILLECTOMY  ~ 1959  . TUBAL LIGATION  1984?     Current Outpatient Medications  Medication Sig Dispense Refill  . albuterol (PROVENTIL) (5 MG/ML) 0.5% nebulizer solution Take 2.5 mg by nebulization every 6 (six) hours as needed for wheezing or shortness of breath.    . allopurinol (ZYLOPRIM) 100 MG tablet Take 50 mg by mouth daily.    . AMBULATORY NON FORMULARY MEDICATION Medication Name: 100% O2 15 leters a min for 15-20 mins  Via Nasal cannual 1 Bottle 2  . amitriptyline (ELAVIL) 10 MG tablet Take 10 mg by mouth daily as needed (migraine).     Marland Kitchen apixaban (ELIQUIS) 5 MG TABS tablet Take 5 mg by mouth 2 (two) times daily.    . colchicine 0.6 MG tablet Take 1 tablet (0.6 mg total) by mouth 2 (two) times daily as needed (for gout flare ups.). 60 tablet 1  . dapagliflozin propanediol (FARXIGA) 10 MG TABS tablet Take 1 tablet (10 mg total) by mouth daily before breakfast. 30 tablet 6  . digoxin (LANOXIN) 0.125 MG tablet Take 1 tablet (0.125 mg total) by mouth daily. 30 tablet 6  . diphenhydrAMINE (SOMINEX) 25 MG tablet Take 25 mg by mouth at bedtime as needed for sleep.     Marland Kitchen guaiFENesin (MUCINEX) 600 MG 12 hr tablet Take 600 mg by mouth as needed for cough or to loosen phlegm.     Marland Kitchen ipratropium-albuterol (DUONEB) 0.5-2.5 (3) MG/3ML SOLN Take 3 mLs by nebulization every 4 (four) hours as needed.    Marland Kitchen losartan (COZAAR) 50 MG tablet Take 50 mg by mouth every evening.       . Melatonin 1 MG CAPS Take 1 mg by mouth at bedtime as needed (sleep).    . montelukast (SINGULAIR) 10 MG tablet TAKE 1 TABLET (10 MG TOTAL) BY MOUTH AT BEDTIME. 60 tablet 2  . potassium chloride SA (KLOR-CON M20) 20 MEQ tablet Take 20 mEq by mouth 2 (two) times daily.    Marland Kitchen Spacer/Aero-Holding Chambers (AEROCHAMBER MV) inhaler Use as instructed with Symbicort 1 each 0  . spironolactone (ALDACTONE) 25 MG tablet Take 1 tablet (25 mg total) by mouth at bedtime. 30 tablet 6  . tiotropium (SPIRIVA) 18 MCG inhalation capsule Place 18 mcg into inhaler and inhale daily as needed (shortness of breath).     . torsemide (DEMADEX) 20 MG tablet Take 2 tablets (40 mg total)  by mouth 2 (two) times daily. 180 tablet 3  . Vitamin D, Ergocalciferol, (DRISDOL) 1.25 MG (50000 UNIT) CAPS capsule Take 1 capsule (50,000 Units total) by mouth every 7 (seven) days. 12 capsule 0   No current facility-administered medications for this encounter.    Allergies:   Prednisone   Social History:  The patient  reports that she quit smoking about 12 months ago. Her smoking use included cigarettes. She has a 15.00 pack-year smoking history. She has never used smokeless tobacco. She reports current alcohol use of about 2.0 standard drinks of alcohol per week. She reports that she does not use drugs.   Family History:  The patient's family history includes Cancer (age of onset: 24) in her sister; Cardiomyopathy in her sister; Dementia in her mother; Diabetes in her brother and another family member; Heart disease in her sister.   ROS:  Please see the history of present illness.   All other systems are personally reviewed and negative.   Vitals:   08/05/19 0956  BP: 100/75  Pulse: 64  SpO2: 96%  Weight: 55.9 kg (123 lb 3.2 oz)     Exam:   General:  Well appearing. No resp difficulty HEENT: normal Neck: supple. no JVD. Carotids 2+ bilat; no bruits. No lymphadenopathy or thryomegaly appreciated. Cor: PMI nondisplaced.  Regular rate & rhythm. No rubs, gallops or murmurs. Lungs: clear markedly decreased lung sounds throughout Abdomen: soft, nontender, nondistended. No hepatosplenomegaly. No bruits or masses. Good bowel sounds. Extremities: no cyanosis, clubbing, rash, edema Neuro: alert & orientedx3, cranial nerves grossly intact. moves all 4 extremities w/o difficulty. Affect pleasant  Recent Labs: 01/26/2019: Magnesium 2.1 06/29/2019: B Natriuretic Peptide 1,860.2 07/24/2019: ALT 25; BUN 34; Creat 1.45; Hemoglobin 15.5; Platelets 219; Potassium 4.1; Sodium 138; TSH 2.40  Personally reviewed   Wt Readings from Last 3 Encounters:  08/05/19 55.9 kg (123 lb 3.2 oz)  07/24/19 54.6 kg (120 lb 6 oz)  06/29/19 56.2 kg (124 lb)      ASSESSMENT AND PLAN:  1. Chronic systolic CHF: EF 02% s/p CRT-P upgrade 05/2012 (due to chronic RV pacing)  - Echo 6/19: LVEF 35-40%, mild MR, Mild TR - She is s/p recent hospitalization 5/21 for what sounds like a COPD flare requiring intubation. Had some concomittant HF - Echo in [place], [place] 5/21 EF 20-25% - Cath 5/21. No CAD. EF 20% CI 1.6 - Much improved over last 2 months with med titrations - NYHA II-III - Volume status looks OK despite ReDS 45% - We discussed potential need for advanced therapies but lung disease is prohibitive with FEV1 0.88 and DLCO 41% on recent PFTs - Continue torsemide to 40 mg BID - Bisoprolol stopped due to recent resp failure and low output - Continue losartan 50 daily  - Continue spiro 25 daily.  - Continue Farxiga 10 - Continue digoxin 0.125mg  daily - Much improved. With low BP would not titrate meds further at this point but need to follow closely to make sure volume status is well maintained. - Labs today - ICD interrogated in clinic 99% BIV pacing. No VT/AT. Volume ok  2. H/o HOCM  - No LVOT gradient - Has been seen in [facility]. All 1st degree relatives have been tested - No change  3. Tobacco use/COPD - Says she  has been abstinent since 7/20 - PFTs as above. Slightly improved off after stopping smoking but prohibitive for advanced therapies  4. Persistent AF/AFL - Follows with Dr. Curt Bears.  -  Had repeat AF ablation with Dr. Curt Bears 12/26/18.  - remains in NSR - Continue Eliquis. No bleeding  Signed, Glori Bickers, MD  08/05/2019 10:08 AM  Advanced Heart Failure Clinic [facility] [place] [facility] [place] [address] [phone] (office) [phone] (fax)

## 2019-08-05 NOTE — Patient Instructions (Signed)
It was great to see you today! No medication changes are needed at this time.  Labs today We will only contact you if something comes back abnormal or we need to make some changes. Otherwise no news is good news!  You have been referred to [facility] Address: [place], [place] [address] Phone: [phone]  Your physician recommends that you schedule a follow-up appointment in: 2-3 months with Dr Haroldine Laws  If you have any questions or concerns before your next appointment please send [place] a message through [facility] or call our office at [phone].    TO LEAVE A MESSAGE FOR THE NURSE SELECT OPTION 2, PLEASE LEAVE A MESSAGE INCLUDING: . YOUR NAME . DATE OF BIRTH . CALL BACK NUMBER . REASON FOR CALL**this is important as we prioritize the call backs  YOU WILL RECEIVE A CALL BACK THE SAME DAY AS LONG AS YOU CALL BEFORE 4:00 PM

## 2019-08-07 ENCOUNTER — Ambulatory Visit (HOSPITAL_BASED_OUTPATIENT_CLINIC_OR_DEPARTMENT_OTHER)
Admission: RE | Admit: 2019-08-07 | Discharge: 2019-08-07 | Disposition: A | Discharge status: Home / Self Care | Payer: 59 | Source: Ambulatory Visit | Attending: Family Medicine | Admitting: Family Medicine

## 2019-08-07 ENCOUNTER — Other Ambulatory Visit: Payer: Self-pay

## 2019-08-07 DIAGNOSIS — R748 Abnormal levels of other serum enzymes: Secondary | ICD-10-CM | POA: Insufficient documentation

## 2019-08-07 DIAGNOSIS — M810 Age-related osteoporosis without current pathological fracture: Secondary | ICD-10-CM | POA: Diagnosis not present

## 2019-08-07 DIAGNOSIS — Z1231 Encounter for screening mammogram for malignant neoplasm of breast: Secondary | ICD-10-CM | POA: Insufficient documentation

## 2019-08-07 DIAGNOSIS — R7989 Other specified abnormal findings of blood chemistry: Secondary | ICD-10-CM | POA: Insufficient documentation

## 2019-08-07 NOTE — Progress Notes (Signed)
Called pt and lmovm to return call.

## 2019-08-10 DIAGNOSIS — L820 Inflamed seborrheic keratosis: Secondary | ICD-10-CM | POA: Diagnosis not present

## 2019-08-10 DIAGNOSIS — Z859 Personal history of malignant neoplasm, unspecified: Secondary | ICD-10-CM | POA: Diagnosis not present

## 2019-08-10 DIAGNOSIS — X32XXXS Exposure to sunlight, sequela: Secondary | ICD-10-CM | POA: Diagnosis not present

## 2019-08-10 DIAGNOSIS — L308 Other specified dermatitis: Secondary | ICD-10-CM | POA: Diagnosis not present

## 2019-08-10 DIAGNOSIS — D692 Other nonthrombocytopenic purpura: Secondary | ICD-10-CM | POA: Diagnosis not present

## 2019-08-10 DIAGNOSIS — D239 Other benign neoplasm of skin, unspecified: Secondary | ICD-10-CM | POA: Diagnosis not present

## 2019-08-10 DIAGNOSIS — L814 Other melanin hyperpigmentation: Secondary | ICD-10-CM | POA: Diagnosis not present

## 2019-08-10 DIAGNOSIS — L720 Epidermal cyst: Secondary | ICD-10-CM | POA: Diagnosis not present

## 2019-08-10 MED FILL — TORSEMIDE 20 MG TABLET: 20 | 30 days supply | Qty: 180 | Fill #1

## 2019-08-10 MED FILL — TRIAMCINOLONE ACETONIDE 0.1: 0.1 | 30 days supply | Qty: 80 | Fill #0

## 2019-08-21 MED FILL — MONTELUKAST SOD 10 MG TAB: 10 | 60 days supply | Qty: 60 | Fill #1

## 2019-08-24 ENCOUNTER — Ambulatory Visit (INDEPENDENT_AMBULATORY_CARE_PROVIDER_SITE_OTHER): Payer: 59 | Admitting: *Deleted

## 2019-08-24 DIAGNOSIS — I4819 Other persistent atrial fibrillation: Secondary | ICD-10-CM

## 2019-08-26 LAB — CUP PACEART REMOTE DEVICE CHECK
Battery Remaining Longevity: 7 mo
Battery Remaining Percentage: 8 %
Battery Voltage: 2.69 V
Brady Statistic AP VP Percent: 58 %
Brady Statistic AP VS Percent: 1 %
Brady Statistic AS VP Percent: 41 %
Brady Statistic AS VS Percent: 1 %
Brady Statistic RA Percent Paced: 57 %
Date Time Interrogation Session: 20210817153321
Implantable Lead Implant Date: 19940705
Implantable Lead Implant Date: 19940705
Implantable Lead Implant Date: 20140501
Implantable Lead Location: 753858
Implantable Lead Location: 753859
Implantable Lead Location: 753860
Implantable Lead Model: 4024
Implantable Lead Model: 4524
Implantable Pulse Generator Implant Date: 20140501
Lead Channel Impedance Value: 350 Ohm
Lead Channel Impedance Value: 460 Ohm
Lead Channel Impedance Value: 630 Ohm
Lead Channel Pacing Threshold Amplitude: 1 V
Lead Channel Pacing Threshold Amplitude: 1 V
Lead Channel Pacing Threshold Amplitude: 1.25 V
Lead Channel Pacing Threshold Pulse Width: 0.4 ms
Lead Channel Pacing Threshold Pulse Width: 0.4 ms
Lead Channel Pacing Threshold Pulse Width: 1 ms
Lead Channel Sensing Intrinsic Amplitude: 3.2 mV
Lead Channel Sensing Intrinsic Amplitude: 3.8 mV
Lead Channel Setting Pacing Amplitude: 2 V
Lead Channel Setting Pacing Amplitude: 2.5 V
Lead Channel Setting Pacing Amplitude: 2.5 V
Lead Channel Setting Pacing Pulse Width: 0.4 ms
Lead Channel Setting Pacing Pulse Width: 1 ms
Lead Channel Setting Sensing Sensitivity: 2 mV
Pulse Gen Model: 3210
Pulse Gen Serial Number: 2936617

## 2019-08-26 NOTE — Progress Notes (Signed)
Remote pacemaker transmission.   

## 2019-08-27 ENCOUNTER — Encounter: Payer: Self-pay | Admitting: Family Medicine

## 2019-08-27 ENCOUNTER — Other Ambulatory Visit: Payer: Self-pay | Admitting: Family Medicine

## 2019-08-27 ENCOUNTER — Other Ambulatory Visit: Payer: Self-pay | Admitting: Adult Health

## 2019-08-27 MED ORDER — ALPRAZOLAM 0.25 MG PO TABS: 0.2500 mg | ORAL_TABLET | Freq: Two times a day (BID) | ORAL | 1 refills | Status: DC | PRN

## 2019-08-27 MED FILL — SYMBICORT 160-4.5 MCG INH: 160-4.5 | 30 days supply | Qty: 10 | Fill #0

## 2019-08-27 MED FILL — FARXIGA 10 MG TABLET: 10 | 30 days supply | Qty: 30 | Fill #2

## 2019-08-27 MED FILL — SPIRIVA RESPIMAT 2.5 MCG IN: 2.5 | 30 days supply | Qty: 4 | Fill #7

## 2019-08-27 MED FILL — ALBUTEROL SULFATE HFA 108 (: 108 (90 BAS | 25 days supply | Qty: 18 | Fill #2

## 2019-08-27 MED FILL — ALPRAZolam 0.25 MG TABS: 0.25 | 15 days supply | Qty: 30 | Fill #0

## 2019-08-27 MED FILL — FLUoxetine HCL 20 MG CAPS: 20 | 90 days supply | Qty: 90 | Fill #1

## 2019-09-04 ENCOUNTER — Other Ambulatory Visit: Payer: Self-pay | Admitting: Primary Care

## 2019-09-04 MED FILL — DIGOXIN 0.125 MG TABLET: 125 | 30 days supply | Qty: 30 | Fill #2

## 2019-09-04 MED FILL — SPIRONOLACTONE 25 MG TABS: 25 | 30 days supply | Qty: 30 | Fill #2

## 2019-09-04 MED FILL — TORSEMIDE 20 MG TABLET: 20 | 30 days supply | Qty: 180 | Fill #2

## 2019-09-04 MED FILL — IPRAT-ALBUT 0.5-3(2.5) MG/3: 0.5-2.5 (3) | 15 days supply | Qty: 270 | Fill #0

## 2019-09-29 DIAGNOSIS — I421 Obstructive hypertrophic cardiomyopathy: Secondary | ICD-10-CM | POA: Diagnosis not present

## 2019-09-29 DIAGNOSIS — I480 Paroxysmal atrial fibrillation: Secondary | ICD-10-CM | POA: Diagnosis not present

## 2019-09-29 DIAGNOSIS — N1832 Chronic kidney disease, stage 3b: Secondary | ICD-10-CM | POA: Diagnosis not present

## 2019-09-29 DIAGNOSIS — I5020 Unspecified systolic (congestive) heart failure: Secondary | ICD-10-CM | POA: Diagnosis not present

## 2019-09-29 DIAGNOSIS — J449 Chronic obstructive pulmonary disease, unspecified: Secondary | ICD-10-CM | POA: Diagnosis not present

## 2019-10-02 MED FILL — DIGOXIN 0.125 MG TABLET: 125 | 30 days supply | Qty: 30 | Fill #3

## 2019-10-02 MED FILL — SPIRONOLACTONE 25 MG TABS: 25 | 30 days supply | Qty: 30 | Fill #3

## 2019-10-02 MED FILL — ALPRAZolam 0.25 MG TABS: 0.25 | 15 days supply | Qty: 30 | Fill #1

## 2019-10-02 MED FILL — ELIQUIS 5 MG TABLET: 5 | 30 days supply | Qty: 60 | Fill #2

## 2019-10-02 MED FILL — TORSEMIDE 20 MG TABLET: 20 | 30 days supply | Qty: 180 | Fill #3

## 2019-10-02 MED FILL — FARXIGA 10 MG TABLET: 10 | 30 days supply | Qty: 30 | Fill #3

## 2019-10-05 ENCOUNTER — Other Ambulatory Visit: Payer: Self-pay | Admitting: Internal Medicine

## 2019-10-05 ENCOUNTER — Ambulatory Visit (INDEPENDENT_AMBULATORY_CARE_PROVIDER_SITE_OTHER): Payer: 59 | Admitting: Cardiology

## 2019-10-05 ENCOUNTER — Other Ambulatory Visit: Payer: Self-pay

## 2019-10-05 ENCOUNTER — Encounter: Payer: Self-pay | Admitting: Cardiology

## 2019-10-05 VITALS — BP 116/68 | HR 80 | Ht 62.0 in | Wt 129.0 lb

## 2019-10-05 DIAGNOSIS — I4819 Other persistent atrial fibrillation: Secondary | ICD-10-CM | POA: Diagnosis not present

## 2019-10-05 DIAGNOSIS — N1832 Chronic kidney disease, stage 3b: Secondary | ICD-10-CM

## 2019-10-05 NOTE — Progress Notes (Signed)
Electrophysiology Office Note   Date:  10/05/2019   ID:  Angela Shepherd, Angela Shepherd 05-11-1950, MRN [id number]  PCP:  Midge Minium, MD  Cardiologist:  [place] Primary Electrophysiologist:  Nyiesha Beever Meredith Leeds, MD    No chief complaint on file.    History of Present Illness: Angela Shepherd is a 69 y.o. female who is being seen today for the evaluation of HOCM at the request of Bensimhon, Shaune Pascal, MD. Presenting today for electrophysiology evaluation.  She has a history of hypertrophic cardiomyopathy, bradycardia status post pacemaker in 1990s at [facility], hypertension, tobacco use quit in 2012.  She was admitted December 2011 with acute heart failure was found to have an ejection fraction of 30 to 35%.  Catheterization at the time showed normal coronary arteries with an ejection fraction of 40 to 45%.  She was seen by EP in the time of his found to have high RV pacing burden and was upgraded to a [facility] CRT-P May 2014.  Echo in 2016 showed an ejection fraction of 45 to 50%.  She also has a history of atrial fibrillation and is status post ablation 11/18/2017 with repeat ablation 12/26/2018.  She was admitted to the hospital in [place] with acute respiratory failure and delirium.  She was intubated at the time.  Her lactate acid is 5.3.  She is found to have a right-sided pneumonia and pulmonary edema.  She was treated with antibiotics and IV Lasix and her dofetilide was stopped.  An echo at the time showed an ejection fraction of 20 to 25%.  Today, denies symptoms of palpitations, chest pain, shortness of breath, orthopnea, PND, lower extremity edema, claudication, dizziness, presyncope, syncope, bleeding, or neurologic sequela. The patient is tolerating medications without difficulties.     Past Medical History:  Diagnosis Date   Bradycardia    a. initial PPM placed 1994 at [facility] with gen change 2002. b. 1*AVB & intermittent 2nd degree  AVB + CHF --> upgrade to [facility]. Jude BiV PPM 05/08/12.   Chronic systolic CHF (congestive heart failure) ([place])    a. EF 30-35% dx in 2011 -> most recent 40-45% by echo 04/2012. b. s/p upgrade to BiV-PPM 05/08/12. c. Med titration limited by hypotension.   Cluster headache syndrome    Hypertr obst cardiomyop    Hypokalemia    Hypotension    Paroxysmal atrial fibrillation ([place])    a. identified on PPM interrogation b. longest episode 1 hour; if burden increases, Kamylah Manzo need [place]    Pneumonia 2008   Sleep apnea    Past Surgical History:  Procedure Laterality Date   ATRIAL FIBRILLATION ABLATION N/A 11/15/2017   Procedure: ATRIAL FIBRILLATION ABLATION;  Surgeon: Constance Haw, MD;  Location: [place] CV LAB;  Service: Cardiovascular;  Laterality: N/A;   ATRIAL FIBRILLATION ABLATION N/A 12/26/2018   Procedure: ATRIAL FIBRILLATION ABLATION;  Surgeon: Constance Haw, MD;  Location: [place] CV LAB;  Service: Cardiovascular;  Laterality: N/A;   BI-VENTRICULAR PACEMAKER UPGRADE N/A 05/08/2012   upgrade to SJM Anthem (CRT-P) by Dr Rayann Heman   CARDIOVERSION N/A 09/19/2017   Procedure: CARDIOVERSION;  Surgeon: Sanda Klein, MD;  Location: [place];  Service: Cardiovascular;  Laterality: N/A;   CARDIOVERSION N/A 10/01/2018   Procedure: CARDIOVERSION;  Surgeon: Donato Heinz, MD;  Location: [place];  Service: Endoscopy;  Laterality: N/A;   PACEMAKER INSERTION  1994; 2002; 05/08/2012   RIGHT/LEFT HEART CATH AND CORONARY ANGIOGRAPHY N/A 06/05/2019  Procedure: RIGHT/LEFT HEART CATH AND CORONARY ANGIOGRAPHY;  Surgeon: Jolaine Artist, MD;  Location: [place] CV LAB;  Service: Cardiovascular;  Laterality: N/A;   TONSILLECTOMY  ~ [place]?     Current Outpatient Medications  Medication Sig Dispense Refill   albuterol (PROVENTIL) (5 MG/ML) 0.5% nebulizer solution Take 2.5 mg by nebulization every 6 (six) hours as needed for wheezing or  shortness of breath.     allopurinol (ZYLOPRIM) 100 MG tablet Take 50 mg by mouth daily.     ALPRAZolam (XANAX) 0.25 MG tablet Take 1 tablet (0.25 mg total) by mouth 2 (two) times daily as needed. for anxiety 30 tablet 1   AMBULATORY NON FORMULARY MEDICATION Medication Name: 100% O2 15 leters a min for 15-20 mins  Via Nasal cannual 1 Bottle 2   amitriptyline (ELAVIL) 10 MG tablet Take 10 mg by mouth daily as needed (migraine).      apixaban (ELIQUIS) 5 MG TABS tablet Take 5 mg by mouth 2 (two) times daily.     colchicine 0.6 MG tablet Take 1 tablet (0.6 mg total) by mouth 2 (two) times daily as needed (for gout flare ups.). 60 tablet 1   dapagliflozin propanediol (FARXIGA) 10 MG TABS tablet Take 1 tablet (10 mg total) by mouth daily before breakfast. 30 tablet 6   digoxin (LANOXIN) 0.125 MG tablet Take 1 tablet (0.125 mg total) by mouth daily. 30 tablet 6   diphenhydrAMINE (SOMINEX) 25 MG tablet Take 25 mg by mouth at bedtime as needed for sleep.      guaiFENesin (MUCINEX) 600 MG 12 hr tablet Take 600 mg by mouth as needed for cough or to loosen phlegm.      ipratropium-albuterol (DUONEB) 0.5-2.5 (3) MG/3ML SOLN USE 1 VIAL (3ML) BY NEBULIZER EVERY 4 TO 6 HOURS AS NEEDED 270 mL 0   losartan (COZAAR) 50 MG tablet Take 50 mg by mouth every evening.      Melatonin 1 MG CAPS Take 1 mg by mouth at bedtime as needed (sleep).     montelukast (SINGULAIR) 10 MG tablet TAKE 1 TABLET (10 MG TOTAL) BY MOUTH AT BEDTIME. 60 tablet 2   potassium chloride SA (KLOR-CON M20) 20 MEQ tablet Take 20 mEq by mouth 2 (two) times daily.     Spacer/Aero-Holding Chambers (AEROCHAMBER MV) inhaler Use as instructed with Symbicort 1 each 0   spironolactone (ALDACTONE) 25 MG tablet Take 1 tablet (25 mg total) by mouth at bedtime. 30 tablet 6   SYMBICORT 160-4.5 MCG/ACT inhaler INHALE 2 PUFFS BY MOUTH INTO THE LUNGS 2 (TWO) TIMES DAILY. 10.2 g 1   tiotropium (SPIRIVA) 18 MCG inhalation capsule Place 18 mcg into  inhaler and inhale daily as needed (shortness of breath).      torsemide (DEMADEX) 20 MG tablet Take 2 tablets (40 mg total) by mouth 2 (two) times daily. 180 tablet 3   Vitamin D, Ergocalciferol, (DRISDOL) 1.25 MG (50000 UNIT) CAPS capsule Take 1 capsule (50,000 Units total) by mouth every 7 (seven) days. 12 capsule 0   No current facility-administered medications for this visit.    Allergies:   Prednisone   Social History:  The patient  reports that she quit smoking about 14 months ago. Her smoking use included cigarettes. She has a 15.00 pack-year smoking history. She has never used smokeless tobacco. She reports current alcohol use of about 2.0 standard drinks of alcohol per week. She reports that she does not use drugs.  Family History:  The patient's family history includes Cancer (age of onset: 37) in her sister; Cardiomyopathy in her sister; Dementia in her mother; Diabetes in her brother and another family member; Heart disease in her sister.   ROS:  Please see the history of present illness.   Otherwise, review of systems is positive for none.   All other systems are reviewed and negative.   PHYSICAL EXAM: VS:  BP 116/68    Pulse 80    Ht 5\' 2"  (1.575 m)    Wt 129 lb (58.5 kg)    LMP  (LMP Unknown)    SpO2 (!) 87%    BMI 23.59 kg/m  , BMI Body mass index is 23.59 kg/m. GEN: Well nourished, well developed, in no acute distress  HEENT: normal  Neck: no JVD, carotid bruits, or masses Cardiac: RRR; no murmurs, rubs, or gallops,no edema  Respiratory:  clear to auscultation bilaterally, normal work of breathing GI: soft, nontender, nondistended, + BS MS: no deformity or atrophy  Skin: warm and dry, device site well healed Neuro:  Strength and sensation are intact Psych: euthymic mood, full affect  EKG:  EKG is ordered today. Personal review of the ekg ordered shows atrial sensed, ventricular paced  Personal review of the device interrogation today. Results in [place]: 01/26/2019: Magnesium 2.1 07/24/2019: ALT 25; TSH 2.40 08/05/2019: B Natriuretic Peptide 1,107.4; BUN 31; Creatinine, Ser 1.33; Hemoglobin 14.2; Platelets 228; Potassium 4.4; Sodium 138    Lipid Panel     Component Value Date/Time   CHOL 211 (H) 07/24/2019 1511   TRIG 125 07/24/2019 1511   HDL 66 07/24/2019 1511   CHOLHDL 3.2 07/24/2019 1511   VLDL 8 06/21/2017 0143   LDLCALC 121 (H) 07/24/2019 1511   LDLDIRECT 149.0 06/02/2015 1351     Wt Readings from Last 3 Encounters:  10/05/19 129 lb (58.5 kg)  08/05/19 123 lb 3.2 oz (55.9 kg)  07/24/19 120 lb 6 oz (54.6 kg)    Ejaculatory dysfunction AN:  1.  Chronic systolic heart failure: Currently on optimal medical therapy for heart failure.  Ejection fraction 20 to 25% based on echo in [place].  Status post [facility] Jude CRT-P.  Device functioning appropriately.  No changes.    2.  Hypertrophic cardiomyopathy: Stable on most recent echo.  Plan per primary cardiology.    3.  Hypertension: Currently well controlled  4.  Persistent atrial fibrillation/flutter: Status post repeat ablation 12/26/2018.  CHA2DS2-VASc of 4.  Currently on Eliquis.  Is been taken off of Tikosyn at her most recent hospitalization.  Minimal atrial fibrillation noted.  Current medicines are reviewed at length with the patient today.   The patient does not have concerns regarding her medicines.  The following changes were made today: None  Labs/ tests ordered today include:  Orders Placed This Encounter  Procedures   EKG 12-Lead    Disposition:   FU with Shakeisha Horine 6 month  Signed, Renae Mottley Meredith Leeds, MD  10/05/2019 4:16 PM     [address] [place] [place] [address] [phone] (office) [phone] (fax)

## 2019-10-27 ENCOUNTER — Other Ambulatory Visit: Payer: Self-pay | Admitting: Family Medicine

## 2019-10-27 MED FILL — TORSEMIDE 20 MG TABLET: 20 | 90 days supply | Qty: 180 | Fill #0

## 2019-10-27 MED FILL — ALPRAZolam 0.25 MG TABS: 0.25 | 15 days supply | Qty: 30 | Fill #0

## 2019-10-27 MED FILL — MONTELUKAST SOD 10 MG TAB: 10 | 60 days supply | Qty: 60 | Fill #2

## 2019-10-27 NOTE — Telephone Encounter (Signed)
Last OV 07/24/19 Alprazolam last filled 08/27/19 #30 with 1

## 2019-11-04 NOTE — Progress Notes (Addendum)
Advanced HF Clinic Note   Date:  11/05/2019   ID:  Angela Shepherd, DOB [birth year], MRN [id number]  Location: Home  Provider location: [facility] Type of Visit: Established patient  PCP:  Midge Minium, MD  Cardiologist:  No primary care provider on file. Primary HF: Conard Alvira  Chief Complaint: Heart Failure follow-up   History of Present Illness:  Angela Shepherd is a 69 y.o. 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 She has a h/o HOCM, systolic HF due to NICM s/p CRT-D, severe COPD and PAF s/p ablation x 2.   Was admitted in December 2011 with acute HF. Echo with EF 30-35%, There was also evidence of ASH with septum of 1.7 PW = 1.9.Cath 12/2009: Normal coronaries. EF 40-45%.    Was seen by EP and noted to have a high percentage of RV pacing (99%) and it was felt this was worsening LV function. Underwent upgrade to [facility]. Jude CRT-P in 5/14.    Seen by [facility] in [facility] and found to be carrier for HOCM. No LVOT obstruction clinically.  Admitted in 6/19 for syncope with SBP 71/59. Arlyce Harman and losartan stopped. Echo with EF 35-40%  She also has atrial fibrillation and had AF ablation x 2 with Dr. Curt Bears 11/18/2017. & 12/26/18.  Admitted in [facility] in 5/21 with acute respiratory failure and delirium.Intubated. With lactic acid 5.3. Found to have R-sided PNA and pulmory edema. Treated with abx and IV lasix. Tikosyn stopped. Echo EF 20-25%.   Cath 06/05/19 No CAD. EF 20% Ao = 92/60 LV =  89/19 RA = 10 RV = 68/12 PA = 64/25 (39) PCW = 23 Fick cardiac output/index = 2.5/1.6 PVR = 6.5 WU Ao sat = 99% PA sat = 57%, 58% PAPi = 3.9  PFTs 7/21 FEV1 0.88L FVC 1.56 DLCO 41%  Recently started on digoxin, spiro and Farxiga with improvement. Today she returns for HF follow up. Recently had gout flare and now improved. Says her ankles are swollen. Has been torsemide 60 bid but Pharmacy refilled with 20 bid  earlier this week. She called here and was told to take 40 bid. Weight up 4 pounds. + DOE. No orthopnea or PND.   ReDS 45-> 49%   Cardiac studies Echo in March 2012:  EF 50-55%. Grade 2 diastolic dysfunction. IVS 1.4cm.  Echo 4/14: EF 40-45%  Echo 4/16: EF 45-50% Echo 6/19 EF 35-40% Mild LVH no LVOT obstruction  Echo 05/2019 EF 20-25% at [facility]   Past Medical History:  Diagnosis Date  . Bradycardia    a. initial PPM [facility] with gen change 2002. b. 1*AVB & intermittent 2nd degree AVB + CHF --> upgrade to [facility]. Jude BiV PPM 05/08/12.  . Chronic systolic CHF (congestive heart failure) ([place])    a. EF 30-35% dx in 2011 -> most recent 40-45% by echo 04/2012. b. s/p upgrade to BiV-PPM 05/08/12. c. Med titration limited by hypotension.  . Cluster headache syndrome   . Hypertr obst cardiomyop   . Hypokalemia   . Hypotension   . Paroxysmal atrial fibrillation (HCC)    a. identified on PPM interrogation b. longest episode 1 hour; if burden increases, will need [place]   . Pneumonia 2008  . Sleep apnea    Past Surgical History:  Procedure Laterality Date  . ATRIAL FIBRILLATION ABLATION N/A 11/15/2017   Procedure: ATRIAL FIBRILLATION ABLATION;  Surgeon: Constance Haw, MD;  Location:  [place] [facility];  Service: Cardiovascular;  Laterality: N/A;  . ATRIAL FIBRILLATION ABLATION N/A 12/26/2018   Procedure: ATRIAL FIBRILLATION ABLATION;  Surgeon: Constance Haw, MD;  Location: [place] CV LAB;  Service: Cardiovascular;  Laterality: N/A;  . BI-VENTRICULAR PACEMAKER UPGRADE N/A 05/08/2012   upgrade to [facility] (CRT-P) by Dr Rayann Heman  . CARDIOVERSION N/A 09/19/2017   Procedure: CARDIOVERSION;  Surgeon: Sanda Klein, MD;  Location: [place] ENDOSCOPY;  Service: Cardiovascular;  Laterality: N/A;  . CARDIOVERSION N/A 10/01/2018   Procedure: CARDIOVERSION;  Surgeon: Donato Heinz, MD;  Location: [facility] ENDOSCOPY;  Service: Endoscopy;  Laterality: N/A;  . PACEMAKER INSERTION   1994; 2002; 05/08/2012  . RIGHT/LEFT HEART CATH AND CORONARY ANGIOGRAPHY N/A 06/05/2019   Procedure: RIGHT/LEFT HEART CATH AND CORONARY ANGIOGRAPHY;  Surgeon: Jolaine Artist, MD;  Location: [place] CV LAB;  Service: Cardiovascular;  Laterality: N/A;  . TONSILLECTOMY  ~ 1959  . TUBAL LIGATION  1984?     Current Outpatient Medications  Medication Sig Dispense Refill  . albuterol (PROVENTIL) (5 MG/ML) 0.5% nebulizer solution Take 2.5 mg by nebulization every 6 (six) hours as needed for wheezing or shortness of breath.    . allopurinol (ZYLOPRIM) 100 MG tablet Take 50 mg by mouth daily.    Marland Kitchen ALPRAZolam (XANAX) 0.25 MG tablet TAKE 1 TABLET (0.25 MG TOTAL) BY MOUTH 2 (TWO) TIMES DAILY AS NEEDED FOR ANXIETY 30 tablet 1  . AMBULATORY NON FORMULARY MEDICATION Medication Name: 100% O2 15 leters a min for 15-20 mins  Via Nasal cannual 1 Bottle 2  . amitriptyline (ELAVIL) 10 MG tablet Take 10 mg by mouth daily as needed (migraine).     Marland Kitchen apixaban (ELIQUIS) 5 MG TABS tablet Take 5 mg by mouth 2 (two) times daily.    . colchicine 0.6 MG tablet Take 1 tablet (0.6 mg total) by mouth 2 (two) times daily as needed (for gout flare ups.). 60 tablet 1  . dapagliflozin propanediol (FARXIGA) 10 MG TABS tablet Take 1 tablet (10 mg total) by mouth daily before breakfast. 30 tablet 6  . digoxin (LANOXIN) 0.125 MG tablet Take 1 tablet (0.125 mg total) by mouth daily. 30 tablet 6  . diphenhydrAMINE (SOMINEX) 25 MG tablet Take 25 mg by mouth at bedtime as needed for sleep.     Marland Kitchen FLUoxetine (PROZAC) 20 MG capsule Take 20 mg by mouth daily.    Marland Kitchen guaiFENesin (MUCINEX) 600 MG 12 hr tablet Take 600 mg by mouth as needed for cough or to loosen phlegm.     Marland Kitchen ipratropium-albuterol (DUONEB) 0.5-2.5 (3) MG/3ML SOLN USE 1 VIAL (3ML) BY NEBULIZER EVERY 4 TO 6 HOURS AS NEEDED 270 mL 0  . losartan (COZAAR) 25 MG tablet Take 25 mg by mouth daily.    . Melatonin 10 MG TABS Take 10 mg by mouth at bedtime as needed.    .  montelukast (SINGULAIR) 10 MG tablet TAKE 1 TABLET (10 MG TOTAL) BY MOUTH AT BEDTIME. 60 tablet 2  . potassium chloride SA (KLOR-CON M20) 20 MEQ tablet Take 20 mEq by mouth 2 (two) times daily.    Marland Kitchen Spacer/Aero-Holding Chambers (AEROCHAMBER MV) inhaler Use as instructed with Symbicort 1 each 0  . spironolactone (ALDACTONE) 25 MG tablet Take 1 tablet (25 mg total) by mouth at bedtime. 30 tablet 6  . SYMBICORT 160-4.5 MCG/ACT inhaler INHALE 2 PUFFS BY MOUTH INTO THE LUNGS 2 (TWO) TIMES DAILY. 10.2 g 1  . tiotropium (SPIRIVA) 18 MCG inhalation capsule  Place 18 mcg into inhaler and inhale daily as needed (shortness of breath).     . torsemide (DEMADEX) 20 MG tablet Take 60 mg by mouth 2 (two) times daily.    . traMADol (ULTRAM) 50 MG tablet Take 25 mg by mouth every 6 (six) hours as needed.     No current facility-administered medications for this encounter.    Allergies:   Prednisone   Social History:  The patient  reports that she quit smoking about 15 months ago. Her smoking use included cigarettes. She has a 15.00 pack-year smoking history. She has never used smokeless tobacco. She reports current alcohol use of about 2.0 standard drinks of alcohol per week. She reports that she does not use drugs.   Family History:  The patient's family history includes Cancer (age of onset: 25) in her sister; Cardiomyopathy in her sister; Dementia in her mother; Diabetes in her brother and another family member; Heart disease in her sister.   ROS:  Please see the history of present illness.   All other systems are personally reviewed and negative.   Vitals:   11/05/19 1432  BP: 98/60  Pulse: 69  SpO2: 97%  Weight: 57.8 kg (127 lb 6 oz)    Exam:   General:  Well appearing. No resp difficulty HEENT: normal Neck: supple. no JVD. Carotids 2+ bilat; no bruits. No lymphadenopathy or thryomegaly appreciated. Cor: PMI nondisplaced. Regular rate & rhythm. No rubs, gallops or murmurs. Lungs: markedly  decreased throughout Abdomen: soft, nontender, nondistended. No hepatosplenomegaly. No bruits or masses. Good bowel sounds. Extremities: no cyanosis, clubbing, rash, edema Neuro: alert & orientedx3, cranial nerves grossly intact. moves all 4 extremities w/o difficulty. Affect pleasant   Recent Labs: 01/26/2019: Magnesium 2.1 07/24/2019: ALT 25; TSH 2.40 08/05/2019: B Natriuretic Peptide 1,107.4; BUN 31; Creatinine, Ser 1.33; Hemoglobin 14.2; Platelets 228; Potassium 4.4; Sodium 138  Personally reviewed   Wt Readings from Last 3 Encounters:  11/05/19 57.8 kg (127 lb 6 oz)  10/05/19 58.5 kg (129 lb)  08/05/19 55.9 kg (123 lb 3.2 oz)      ASSESSMENT AND PLAN:  1. Chronic systolic CHF: EF 44% s/p CRT-P upgrade 05/2012 (due to chronic RV pacing)  - Echo 6/19: LVEF 35-40%, mild MR, Mild TR - She is s/p recent hospitalization 5/21 for what sounds like a COPD flare requiring intubation. Had some concomittant HF - Echo in [place] 5/21 EF 20-25% - Cath 5/21. No CAD. EF 20% CI 1.6 - Stable NYHA III  - Volume status mildly elevated. Reds Clip stable at 48% (inaccurate in her) - Resume torsemide 60 mg BID - Will give metolazone to use PRN if fluid not coming down take 2.5mg  with Kcl 40 meq - Bisoprolol stopped due to recent resp failure and low output - Continue losartan 50 daily. BP too soft to switch to [employer] - Continue spiro 25 daily.  - Continue Farxiga 10 mg daily.  - Continue digoxin 0.125mg  daily - Check labs today - We again discussed potential need for advanced therapies but lung disease is prohibitive with FEV1 0.88 and DLCO 41% on recent PFTs - ICD interrogated in Clinic. No VT or AF. ERI < 3 months. EP contacted. Personally reviewed  2. H/o HOCM  - No LVOT gradient - Has been seen in [facility]. All 1st degree relatives have been tested - No change  3. Tobacco use/COPD - Says she has been abstinent since 7/20 - PFTs as above. Slightly improved off  after stopping smoking but prohibitive for advanced therapies  4. Persistent AF/AFL - Follows with Dr. Curt Bears.  - Remains in NSR - Had repeat AF ablation with Dr. Curt Bears 12/26/18.  - Continue Eliquis. No bleeding  5. CKD 3a - stable  - continue [place] with Dr. Annitta Jersey, MD  2:57 PM   [place] [address] [facility] and [place] [address] [phone] (office) [phone] (fax)

## 2019-11-05 ENCOUNTER — Other Ambulatory Visit (HOSPITAL_COMMUNITY): Payer: Self-pay | Admitting: Internal Medicine

## 2019-11-05 ENCOUNTER — Ambulatory Visit (HOSPITAL_COMMUNITY)
Admission: RE | Admit: 2019-11-05 | Discharge: 2019-11-05 | Disposition: A | Discharge status: Home / Self Care | Payer: 59 | Source: Ambulatory Visit | Attending: Internal Medicine | Admitting: Internal Medicine

## 2019-11-05 ENCOUNTER — Other Ambulatory Visit: Payer: Self-pay

## 2019-11-05 VITALS — BP 98/60 | HR 69 | Wt 127.4 lb

## 2019-11-05 DIAGNOSIS — Z87891 Personal history of nicotine dependence: Secondary | ICD-10-CM | POA: Diagnosis not present

## 2019-11-05 DIAGNOSIS — I5022 Chronic systolic (congestive) heart failure: Secondary | ICD-10-CM | POA: Insufficient documentation

## 2019-11-05 DIAGNOSIS — N1831 Chronic kidney disease, stage 3a: Secondary | ICD-10-CM | POA: Diagnosis not present

## 2019-11-05 DIAGNOSIS — J449 Chronic obstructive pulmonary disease, unspecified: Secondary | ICD-10-CM | POA: Diagnosis not present

## 2019-11-05 DIAGNOSIS — Z7901 Long term (current) use of anticoagulants: Secondary | ICD-10-CM | POA: Insufficient documentation

## 2019-11-05 DIAGNOSIS — I421 Obstructive hypertrophic cardiomyopathy: Secondary | ICD-10-CM | POA: Diagnosis not present

## 2019-11-05 DIAGNOSIS — I5042 Chronic combined systolic (congestive) and diastolic (congestive) heart failure: Secondary | ICD-10-CM

## 2019-11-05 DIAGNOSIS — I4819 Other persistent atrial fibrillation: Secondary | ICD-10-CM | POA: Diagnosis not present

## 2019-11-05 DIAGNOSIS — Z79899 Other long term (current) drug therapy: Secondary | ICD-10-CM | POA: Diagnosis not present

## 2019-11-05 DIAGNOSIS — Z720 Tobacco use: Secondary | ICD-10-CM

## 2019-11-05 LAB — CBC
HCT: 43.6 % (ref 36.0–46.0)
Hemoglobin: 14.3 g/dL (ref 12.0–15.0)
MCH: 32.7 pg (ref 26.0–34.0)
MCHC: 32.8 g/dL (ref 30.0–36.0)
MCV: 99.8 fL (ref 80.0–100.0)
Platelets: 204 10*3/uL (ref 150–400)
RBC: 4.37 MIL/uL (ref 3.87–5.11)
RDW: 14.1 % (ref 11.5–15.5)
WBC: 6.5 10*3/uL (ref 4.0–10.5)
nRBC: 0 % (ref 0.0–0.2)

## 2019-11-05 LAB — BASIC METABOLIC PANEL
Anion gap: 12 (ref 5–15)
BUN: 23 mg/dL (ref 8–23)
CO2: 26 mmol/L (ref 22–32)
Calcium: 9.3 mg/dL (ref 8.9–10.3)
Chloride: 101 mmol/L (ref 98–111)
Creatinine, Ser: 1.21 mg/dL — ABNORMAL HIGH (ref 0.44–1.00)
GFR, Estimated: 49 mL/min — ABNORMAL LOW (ref >60)
Glucose, Bld: 84 mg/dL (ref 70–99)
Potassium: 3.5 mmol/L (ref 3.5–5.1)
Sodium: 139 mmol/L (ref 135–145)

## 2019-11-05 LAB — BRAIN NATRIURETIC PEPTIDE: B Natriuretic Peptide: 907.2 pg/mL — ABNORMAL HIGH (ref 0.0–100.0)

## 2019-11-05 LAB — DIGOXIN LEVEL: Digoxin Level: 0.4 ng/mL — ABNORMAL LOW (ref 1.0–2.0)

## 2019-11-05 MED ORDER — METOLAZONE 2.5 MG PO TABS: 2.5000 mg | ORAL_TABLET | Freq: Every day | ORAL | 0 refills | Status: DC

## 2019-11-05 MED ORDER — POTASSIUM CHLORIDE CRYS ER 20 MEQ PO TBCR: 20.0000 meq | EXTENDED_RELEASE_TABLET | Freq: Two times a day (BID) | ORAL | 3 refills | Status: DC

## 2019-11-05 MED ORDER — TORSEMIDE 20 MG PO TABS
60.0000 mg | ORAL_TABLET | Freq: Two times a day (BID) | ORAL | 3 refills | Status: DC
Start: 2019-11-05 — End: 2020-03-30

## 2019-11-05 MED FILL — POTASSIUM CHLORIDE CRYS ER: 20 | 23 days supply | Qty: 90 | Fill #0

## 2019-11-05 MED FILL — metOLazone 2.5 MG TABS: 2.5 | 10 days supply | Qty: 10 | Fill #0

## 2019-11-05 NOTE — Progress Notes (Signed)
ReDS Vest / Clip - 11/05/19 1400      ReDS Vest / Clip   Station Marker A    Ruler Value 25    ReDS Value Range High volume overload    ReDS Actual Value 48

## 2019-11-05 NOTE — Patient Instructions (Addendum)
INCREASE Torsemide 60mg  twice daily  Labs done today, your results will be available in MyChart, we will contact you for abnormal readings.  You have a new prescription of Metolazone 2.5mg . ONLY TAKE IF YOU NEED IT. Take Potassium 71meq ( 2 tablets) when you take metolazone  Your physician has requested that you have an echocardiogram. Echocardiography is a painless test that uses sound waves to create images of your heart. It provides your doctor with information about the size and shape of your heart and how well your heart's chambers and valves are working. This procedure takes approximately one hour. There are no restrictions for this procedure.  You physician recommends that you return for a follow up visit in 3 months  If you have any questions or concerns before your next appointment please send [place] a message through [place] or call our office at [phone].    TO LEAVE A MESSAGE FOR THE NURSE SELECT OPTION 2, PLEASE LEAVE A MESSAGE INCLUDING: . YOUR NAME . DATE OF BIRTH . CALL BACK NUMBER . REASON FOR CALL**this is important as we prioritize the call backs  YOU WILL RECEIVE A CALL BACK THE SAME DAY AS LONG AS YOU CALL BEFORE 4:00 PM

## 2019-11-06 MED FILL — FARXIGA 10 MG TABLET: 10 | 30 days supply | Qty: 30 | Fill #4

## 2019-11-09 ENCOUNTER — Ambulatory Visit (INDEPENDENT_AMBULATORY_CARE_PROVIDER_SITE_OTHER): Payer: 59

## 2019-11-09 DIAGNOSIS — I420 Dilated cardiomyopathy: Secondary | ICD-10-CM

## 2019-11-13 NOTE — Progress Notes (Signed)
Remote pacemaker transmission.   

## 2019-11-16 ENCOUNTER — Telehealth: Payer: Self-pay

## 2019-11-16 MED FILL — DIGOXIN 0.125 MG TABLET: 125 | 30 days supply | Qty: 30 | Fill #4

## 2019-11-16 MED FILL — SPIRONOLACTONE 25 MG TABS: 25 | 30 days supply | Qty: 30 | Fill #4

## 2019-11-16 NOTE — Telephone Encounter (Signed)
Merlin alert received device has reached RRT as of 11/14/19. Patient called to make aware she will need apt. To speak w/ Dr. Curt Bears about gen change.  No answer, LMOVM.

## 2019-11-16 NOTE — Telephone Encounter (Signed)
    I went in pt's chart to see who called. I transferred the call to the [facility]

## 2019-11-16 NOTE — Telephone Encounter (Signed)
Patient returned phone call. Advised patient I will forward her information scheduling to set up apt. To discuss gen change. Advised to call with questions or concerns.

## 2019-11-23 ENCOUNTER — Ambulatory Visit (INDEPENDENT_AMBULATORY_CARE_PROVIDER_SITE_OTHER): Payer: 59

## 2019-11-23 DIAGNOSIS — I420 Dilated cardiomyopathy: Secondary | ICD-10-CM

## 2019-11-23 LAB — CUP PACEART REMOTE DEVICE CHECK
Battery Remaining Longevity: 0 mo
Battery Voltage: 2.59 V
Brady Statistic AP VP Percent: 42 %
Brady Statistic AP VS Percent: 1 %
Brady Statistic AS VP Percent: 58 %
Brady Statistic AS VS Percent: 1 %
Brady Statistic RA Percent Paced: 41 %
Date Time Interrogation Session: 20211115053852
Implantable Lead Implant Date: 19940705
Implantable Lead Implant Date: 19940705
Implantable Lead Implant Date: 20140501
Implantable Lead Location: 753858
Implantable Lead Location: 753859
Implantable Lead Location: 753860
Implantable Lead Model: 4024
Implantable Lead Model: 4524
Implantable Pulse Generator Implant Date: 20140501
Lead Channel Impedance Value: 350 Ohm
Lead Channel Impedance Value: 460 Ohm
Lead Channel Impedance Value: 660 Ohm
Lead Channel Pacing Threshold Amplitude: 0.75 V
Lead Channel Pacing Threshold Amplitude: 1.25 V
Lead Channel Pacing Threshold Amplitude: 1.25 V
Lead Channel Pacing Threshold Pulse Width: 0.4 ms
Lead Channel Pacing Threshold Pulse Width: 0.4 ms
Lead Channel Pacing Threshold Pulse Width: 1 ms
Lead Channel Sensing Intrinsic Amplitude: 2.5 mV
Lead Channel Sensing Intrinsic Amplitude: 3.7 mV
Lead Channel Setting Pacing Amplitude: 2 V
Lead Channel Setting Pacing Amplitude: 2.5 V
Lead Channel Setting Pacing Amplitude: 2.5 V
Lead Channel Setting Pacing Pulse Width: 0.4 ms
Lead Channel Setting Pacing Pulse Width: 1 ms
Lead Channel Setting Sensing Sensitivity: 2 mV
Pulse Gen Model: 3210
Pulse Gen Serial Number: 2936617

## 2019-11-24 NOTE — Progress Notes (Signed)
Remote pacemaker transmission.   

## 2019-11-30 ENCOUNTER — Other Ambulatory Visit: Payer: Self-pay

## 2019-11-30 ENCOUNTER — Ambulatory Visit (INDEPENDENT_AMBULATORY_CARE_PROVIDER_SITE_OTHER): Payer: 59 | Admitting: Cardiology

## 2019-11-30 ENCOUNTER — Encounter: Payer: Self-pay | Admitting: Cardiology

## 2019-11-30 VITALS — BP 98/56 | HR 63 | Ht 60.0 in | Wt 127.0 lb

## 2019-11-30 DIAGNOSIS — R55 Syncope and collapse: Secondary | ICD-10-CM | POA: Diagnosis not present

## 2019-11-30 DIAGNOSIS — Z01812 Encounter for preprocedural laboratory examination: Secondary | ICD-10-CM | POA: Diagnosis not present

## 2019-11-30 DIAGNOSIS — I5022 Chronic systolic (congestive) heart failure: Secondary | ICD-10-CM

## 2019-11-30 LAB — CUP PACEART INCLINIC DEVICE CHECK
Battery Remaining Longevity: 0 mo
Battery Voltage: 2.59 V
Brady Statistic RA Percent Paced: 36 %
Brady Statistic RV Percent Paced: 99.14 %
Date Time Interrogation Session: 20211122145200
Implantable Lead Implant Date: 19940705
Implantable Lead Implant Date: 19940705
Implantable Lead Implant Date: 20140501
Implantable Lead Location: 753858
Implantable Lead Location: 753859
Implantable Lead Location: 753860
Implantable Lead Model: 4024
Implantable Lead Model: 4524
Implantable Pulse Generator Implant Date: 20140501
Lead Channel Pacing Threshold Amplitude: 1 V
Lead Channel Pacing Threshold Amplitude: 1.25 V
Lead Channel Pacing Threshold Amplitude: 1.25 V
Lead Channel Pacing Threshold Pulse Width: 0.4 ms
Lead Channel Pacing Threshold Pulse Width: 0.4 ms
Lead Channel Pacing Threshold Pulse Width: 1 ms
Lead Channel Sensing Intrinsic Amplitude: 2.7 mV
Lead Channel Sensing Intrinsic Amplitude: 3.3 mV
Lead Channel Setting Pacing Amplitude: 2 V
Lead Channel Setting Pacing Amplitude: 2.5 V
Lead Channel Setting Pacing Amplitude: 2.5 V
Lead Channel Setting Pacing Pulse Width: 0.4 ms
Lead Channel Setting Pacing Pulse Width: 1 ms
Lead Channel Setting Sensing Sensitivity: 2 mV
Pulse Gen Model: 3210
Pulse Gen Serial Number: 2936617

## 2019-11-30 NOTE — Patient Instructions (Addendum)
Medication Instructions:  Your physician recommends that you continue on your current medications as directed. Please refer to the Current Medication list given to you today.  *If you need a refill on your cardiac medications before your next appointment, please call your pharmacy*   Lab Work: Today: BMET & CBC If you have labs (blood work) drawn today and your tests are completely normal, you will receive your results only by: Marland Kitchen MyChart Message (if you have MyChart) OR . A paper copy in the mail If you have any lab test that is abnormal or we need to change your treatment, we will call you to review the results.   Testing/Procedures: Your physician has recommended you have your pacemaker battery change. Please see the instructions located below under "other instructions".    Follow-Up: At [facility], you and your health needs are our priority.  As part of our continuing mission to provide you with exceptional heart care, we have created designated Provider Care Teams.  These Care Teams include your primary Cardiologist (physician) and Advanced Practice Providers (APPs -  Physician Assistants and Nurse Practitioners) who all work together to provide you with the care you need, when you need it.  Your next appointment:   2 week(s) after your Pacemaker battery change  The format for your next appointment:   In Person  Provider:   device clinic for a wound check    Thank you for choosing [facility]!!   Trinidad Curet, RN [phone]   Other Instructions   Implantable Device Instructions  You are scheduled for: pacemaker battery change on 12/16/19 with Dr. Curt Bears.  1.   Pre procedure testing-             A.  LAB WORK--- On 11/30/19               B. COVID TEST-- On 12/14/19 @ 12:00 pm - This is a Drive Up Visit at [address] [place], [place] [address].  Someone will direct you to the appropriate testing line. Stay in your car and someone will be with you  shortly.   After you are tested please go home and self quarantine until the day of your procedure.    2. On the day of your procedure 12/16/19 you will go to [facility] hospital ([address]) at 2:30 pm.  Dennis Bast will go to the main entrance A [employer]) and enter where the [employer] are.  You will check in at ADMITTING.  You may have one support person come in to the hospital with you.  They will be asked to wait in the waiting room.   3.   You may have a light breakfast the morning of this procedure, eat by 8:00 am. Nothing by mouth afterwards.    4.   On the morning of your procedure hold your  Farxiga, Spironolactone & Torsemide.  You may take your remaining medications with sips of water.  5.  The night before your procedure and the morning of your procedure scrub your neck/chest with surgical scrub.  An instruction letter is included with this letter below.   5.  Plan for an overnight stay.  If you use your phone frequently bring your phone charger.  When you are discharged you will need someone to drive you home.   6.  You will follow up with the [place] clinic 10-14 days after your procedure. You will follow up with Dr. Curt Bears  91 days after your procedure.  These appointments will be made for you.   * If you have ANY questions after you get home, please call the office [phone] and ask for Tannor Pyon RN or send a MyChart message.    [place] - Preparing For Surgery  Before surgery, you can play an important role. Because skin is not sterile, your skin needs to be as free of germs as possible. You can reduce the number of germs on your skin by washing with CHG (chlorahexidine gluconate) Soap before surgery.  CHG is an antiseptic cleaner which kills germs and bonds with the skin to continue killing germs even after washing.   Please do not use if you have an allergy to CHG or antibacterial soaps.  If your skin becomes reddened/irritated stop using the CHG.     Do not shave (including legs and underarms) for at least 48 hours prior to first CHG shower.  It is OK to shave your face.  Please follow these instructions carefully:  1.  Shower the night before surgery and the morning of surgery with CHG.  2.  If you choose to wash your hair, wash your hair first as usual with your normal shampoo.  3.  After you shampoo, rinse your hair and body thoroughly to remove the shampoo.  4.  Use CHG as you would any other liquid soap.  You can apply CHG directly to the skin and wash gently with a clean washcloth. 5.  Apply the CHG Soap to your body ONLY FROM THE NECK DOWN.  Do not use on open wounds or open sores.  Avoid contact with your eyes, ears, mouth and genitals (private parts).  Wash genitals (private parts) with your normal soap.  6.  Wash thoroughly, paying special attention to the area where your surgery will be performed.  7.  Thoroughly rinse your body with warm water from the neck down.   8.  DO NOT shower/wash with your normal soap after using and rinsing off the CHG soap.  9.  Pat yourself dry with a clean towel.           10.  Wear clean pajamas.           11.  Place clean sheets on your bed the night of your first shower and do not sleep with pets.  Day of Surgery: Do not apply any deodorants/lotions.  Please wear clean clothes to the hospital/surgery center.

## 2019-11-30 NOTE — H&P (View-Only) (Signed)
Electrophysiology Office Note   Date:  11/30/2019   ID:  Angela Shepherd, Angela Shepherd May 22, 1950, MRN [id number]  PCP:  Midge Minium, MD  Cardiologist:  [place] Primary Electrophysiologist:  Daira Hine Meredith Leeds, MD    No chief complaint on file.    History of Present Illness: JETT KULZER is a 69 y.o. female who is being seen today for the evaluation of HOCM at the request of Tabori, Aundra Millet, MD. Presenting today for electrophysiology evaluation.    She has a history of hypertrophic cardiomyopathy, bradycardia status post pacemaker in 1990s at [facility], hypertension, tobacco abuse, quit in 2012.  She was admitted December 2011 with heart failure was found to have an ejection fraction of 30 to 35%.  She was seen at the time with a high RV pacing burden and is now status post [facility] CRT-P upgrade May 2014.  Echo in 2016 showed an ejection fraction of 45 to 50%.  She also has a history of atrial fibrillation and is status post ablation 11/18/2017 with repeat ablation 12/26/2018.  She was admitted to the hospital in [facility] May 2021 with acute respiratory failure and delirium.  She was intubated.  She was found to have a lactate of 5.3.  She had left-sided pneumonia and pulmonary edema.  She was treated with antibiotics, Lasix.  Her dofetilide was stopped at the time.  She was found to have an ejection fraction of 20 to 25%.  Today, denies symptoms of palpitations, chest pain, shortness of breath, orthopnea, PND, lower extremity edema, claudication, dizziness, presyncope, syncope, bleeding, or neurologic sequela. The patient is tolerating medications without difficulties.     Past Medical History:  Diagnosis Date  . Bradycardia    a. initial PPM placed 1994 at [facility] with gen change 2002. b. 1*AVB & intermittent 2nd degree AVB + CHF --> upgrade to [facility]. Jude BiV PPM 05/08/12.  . Chronic systolic CHF (congestive heart failure) ([place])    a. EF  30-35% dx in 2011 -> most recent 40-45% by echo 04/2012. b. s/p upgrade to BiV-PPM 05/08/12. c. Med titration limited by hypotension.  . Cluster headache syndrome   . Hypertr obst cardiomyop   . Hypokalemia   . Hypotension   . Paroxysmal atrial fibrillation (HCC)    a. identified on PPM interrogation b. longest episode 1 hour; if burden increases, Meaghen Vecchiarelli need [place]   . Pneumonia 2008  . Sleep apnea    Past Surgical History:  Procedure Laterality Date  . ATRIAL FIBRILLATION ABLATION N/A 11/15/2017   Procedure: ATRIAL FIBRILLATION ABLATION;  Surgeon: Constance Haw, MD;  Location: [place] CV LAB;  Service: Cardiovascular;  Laterality: N/A;  . ATRIAL FIBRILLATION ABLATION N/A 12/26/2018   Procedure: ATRIAL FIBRILLATION ABLATION;  Surgeon: Constance Haw, MD;  Location: [place] CV LAB;  Service: Cardiovascular;  Laterality: N/A;  . BI-VENTRICULAR PACEMAKER UPGRADE N/A 05/08/2012   upgrade to SJM Anthem (CRT-P) by Dr Rayann Heman  . CARDIOVERSION N/A 09/19/2017   Procedure: CARDIOVERSION;  Surgeon: Sanda Klein, MD;  Location: [place] ENDOSCOPY;  Service: Cardiovascular;  Laterality: N/A;  . CARDIOVERSION N/A 10/01/2018   Procedure: CARDIOVERSION;  Surgeon: Donato Heinz, MD;  Location: [facility] ENDOSCOPY;  Service: Endoscopy;  Laterality: N/A;  . PACEMAKER INSERTION  1994; 2002; 05/08/2012  . RIGHT/LEFT HEART CATH AND CORONARY ANGIOGRAPHY N/A 06/05/2019   Procedure: RIGHT/LEFT HEART CATH AND CORONARY ANGIOGRAPHY;  Surgeon: Jolaine Artist, MD;  Location: [place] CV LAB;  Service: Cardiovascular;  Laterality: N/A;  . TONSILLECTOMY  ~ 1959  . TUBAL LIGATION  1984?     Current Outpatient Medications  Medication Sig Dispense Refill  . albuterol (PROVENTIL) (5 MG/ML) 0.5% nebulizer solution Take 2.5 mg by nebulization every 6 (six) hours as needed for wheezing or shortness of breath.    . allopurinol (ZYLOPRIM) 100 MG tablet Take 50 mg by mouth daily.    Marland Kitchen ALPRAZolam (XANAX) 0.25 MG  tablet TAKE 1 TABLET (0.25 MG TOTAL) BY MOUTH 2 (TWO) TIMES DAILY AS NEEDED FOR ANXIETY 30 tablet 1  . AMBULATORY NON FORMULARY MEDICATION Medication Name: 100% O2 15 leters a min for 15-20 mins  Via Nasal cannual 1 Bottle 2  . amitriptyline (ELAVIL) 10 MG tablet Take 10 mg by mouth daily as needed (migraine).     Marland Kitchen apixaban (ELIQUIS) 5 MG TABS tablet Take 5 mg by mouth 2 (two) times daily.    . colchicine 0.6 MG tablet Take 1 tablet (0.6 mg total) by mouth 2 (two) times daily as needed (for gout flare ups.). 60 tablet 1  . dapagliflozin propanediol (FARXIGA) 10 MG TABS tablet Take 1 tablet (10 mg total) by mouth daily before breakfast. 30 tablet 6  . digoxin (LANOXIN) 0.125 MG tablet Take 1 tablet (0.125 mg total) by mouth daily. 30 tablet 6  . diphenhydrAMINE (SOMINEX) 25 MG tablet Take 25 mg by mouth at bedtime as needed for sleep.     Marland Kitchen FLUoxetine (PROZAC) 20 MG capsule Take 20 mg by mouth daily.    Marland Kitchen guaiFENesin (MUCINEX) 600 MG 12 hr tablet Take 600 mg by mouth as needed for cough or to loosen phlegm.     Marland Kitchen ipratropium-albuterol (DUONEB) 0.5-2.5 (3) MG/3ML SOLN USE 1 VIAL (3ML) BY NEBULIZER EVERY 4 TO 6 HOURS AS NEEDED 270 mL 0  . losartan (COZAAR) 25 MG tablet Take 25 mg by mouth daily.    . Melatonin 10 MG TABS Take 10 mg by mouth at bedtime as needed.    . metolazone (ZAROXOLYN) 2.5 MG tablet Take 1 tablet (2.5 mg total) by mouth daily. 10 tablet 0  . montelukast (SINGULAIR) 10 MG tablet TAKE 1 TABLET (10 MG TOTAL) BY MOUTH AT BEDTIME. 60 tablet 2  . potassium chloride SA (KLOR-CON M20) 20 MEQ tablet Take 1 tablet (20 mEq total) by mouth 2 (two) times daily. Take 97meq (2 tablets) when you take metolazone 90 tablet 3  . Spacer/Aero-Holding Chambers (AEROCHAMBER MV) inhaler Use as instructed with Symbicort 1 each 0  . spironolactone (ALDACTONE) 25 MG tablet Take 1 tablet (25 mg total) by mouth at bedtime. 30 tablet 6  . SYMBICORT 160-4.5 MCG/ACT inhaler INHALE 2 PUFFS BY MOUTH INTO THE  LUNGS 2 (TWO) TIMES DAILY. 10.2 g 1  . tiotropium (SPIRIVA) 18 MCG inhalation capsule Place 18 mcg into inhaler and inhale daily as needed (shortness of breath).     . torsemide (DEMADEX) 20 MG tablet Take 3 tablets (60 mg total) by mouth 2 (two) times daily. 180 tablet 3  . traMADol (ULTRAM) 50 MG tablet Take 25 mg by mouth every 6 (six) hours as needed.     No current facility-administered medications for this visit.    Allergies:   Prednisone   Social History:  The patient  reports that she quit smoking about 16 months ago. Her smoking use included cigarettes. She has a 15.00 pack-year smoking history. She has never used smokeless tobacco. She reports current alcohol use of about 2.0 standard drinks  of alcohol per week. She reports that she does not use drugs.   Family History:  The patient's family history includes Cancer (age of onset: 55) in her sister; Cardiomyopathy in her sister; Dementia in her mother; Diabetes in her brother and another family member; Heart disease in her sister.   ROS:  Please see the history of present illness.   Otherwise, review of systems is positive for none.   All other systems are reviewed and negative.   PHYSICAL EXAM: VS:  BP (!) 98/56   Pulse 63   Ht 5' (1.524 m)   Wt 127 lb (57.6 kg)   LMP  (LMP Unknown)   BMI 24.80 kg/m  , BMI Body mass index is 24.8 kg/m. GEN: Well nourished, well developed, in no acute distress  HEENT: normal  Neck: no JVD, carotid bruits, or masses Cardiac: RRR; no murmurs, rubs, or gallops,no edema  Respiratory:  clear to auscultation bilaterally, normal work of breathing GI: soft, nontender, nondistended, + BS MS: no deformity or atrophy  Skin: warm and dry, device site well healed Neuro:  Strength and sensation are intact Psych: euthymic mood, full affect  EKG:  EKG is not ordered today. Personal review of the ekg ordered 10/06/19 shows atrial sensed, ventricular paced  Personal review of the device interrogation  today. Results in [place]: 01/26/2019: Magnesium 2.1 07/24/2019: ALT 25; TSH 2.40 11/05/2019: B Natriuretic Peptide 907.2; BUN 23; Creatinine, Ser 1.21; Hemoglobin 14.3; Platelets 204; Potassium 3.5; Sodium 139    Lipid Panel     Component Value Date/Time   CHOL 211 (H) 07/24/2019 1511   TRIG 125 07/24/2019 1511   HDL 66 07/24/2019 1511   CHOLHDL 3.2 07/24/2019 1511   VLDL 8 06/21/2017 0143   LDLCALC 121 (H) 07/24/2019 1511   LDLDIRECT 149.0 06/02/2015 1351     Wt Readings from Last 3 Encounters:  11/30/19 127 lb (57.6 kg)  11/05/19 127 lb 6 oz (57.8 kg)  10/05/19 129 lb (58.5 kg)    Ejaculatory dysfunction AN:  1.  Chronic systolic heart failure: Currently on optimal medical therapy for heart failure.  Ejection fraction 20 to 25% based on echo in [place].  Status post [facility] Jude CRT-P.  Device is reached ERI.  We Jamerius Boeckman thus plan for generator change.  Risks and benefits of been discussed which include bleeding and infection.  She understands these risks and is agreed to the procedure.    2.  Hypertrophic cardiomyopathy: Stable on most recent echo.  Plan per primary cardiology.  3.  Hypertension: well controlled  4.  Persistent atrial fibrillation/flutter: Status post repeat ablation 12/26/2018.  CHA2DS2-VASc of 4.  Currently on Eliquis.  Low AF burden on device interrogation.  Current medicines are reviewed at length with the patient today.   The patient does not have concerns regarding her medicines.  The following changes were made today: None  Labs/ tests ordered today include:  Orders Placed This Encounter  Procedures  . Basic metabolic panel  . CBC  . CUP PACEART INCLINIC DEVICE CHECK    Disposition:   FU with Lonia Roane 3 month  Signed, Jesyka Slaght Meredith Leeds, MD  11/30/2019 2:41 PM     [place] [address] [place] [address] [phone] (office) [phone] (fax)

## 2019-11-30 NOTE — Progress Notes (Signed)
Electrophysiology Office Note   Date:  11/30/2019   ID:  Angela Shepherd, [place] Feb 28, 1950, MRN [id number]  PCP:  Midge Minium, MD  Cardiologist:  [place] Primary Electrophysiologist:  Prisha Hiley Meredith Leeds, MD    No chief complaint on file.    History of Present Illness: Angela Shepherd is a 69 y.o. female who is being seen today for the evaluation of HOCM at the request of Tabori, Aundra Millet, MD. Presenting today for electrophysiology evaluation.    She has a history of hypertrophic cardiomyopathy, bradycardia status post pacemaker in 1990s at [facility], hypertension, tobacco abuse, quit in 2012.  She was admitted December 2011 with heart failure was found to have an ejection fraction of 30 to 35%.  She was seen at the time with a high RV pacing burden and is now status post [facility] CRT-P upgrade May 2014.  Echo in 2016 showed an ejection fraction of 45 to 50%.  She also has a history of atrial fibrillation and is status post ablation 11/18/2017 with repeat ablation 12/26/2018.  She was admitted to the hospital in [facility] May 2021 with acute respiratory failure and delirium.  She was intubated.  She was found to have a lactate of 5.3.  She had left-sided pneumonia and pulmonary edema.  She was treated with antibiotics, Lasix.  Her dofetilide was stopped at the time.  She was found to have an ejection fraction of 20 to 25%.  Today, denies symptoms of palpitations, chest pain, shortness of breath, orthopnea, PND, lower extremity edema, claudication, dizziness, presyncope, syncope, bleeding, or neurologic sequela. The patient is tolerating medications without difficulties.     Past Medical History:  Diagnosis Date  . Bradycardia    a. initial PPM placed 1994 at [facility] with gen change 2002. b. 1*AVB & intermittent 2nd degree AVB + CHF --> upgrade to [facility]. Jude BiV PPM 05/08/12.  . Chronic systolic CHF (congestive heart failure) ([place])    a. EF  30-35% dx in 2011 -> most recent 40-45% by echo 04/2012. b. s/p upgrade to BiV-PPM 05/08/12. c. Med titration limited by hypotension.  . Cluster headache syndrome   . Hypertr obst cardiomyop   . Hypokalemia   . Hypotension   . Paroxysmal atrial fibrillation (HCC)    a. identified on PPM interrogation b. longest episode 1 hour; if burden increases, Shawnae Leiva need [place]   . Pneumonia 2008  . Sleep apnea    Past Surgical History:  Procedure Laterality Date  . ATRIAL FIBRILLATION ABLATION N/A 11/15/2017   Procedure: ATRIAL FIBRILLATION ABLATION;  Surgeon: Constance Haw, MD;  Location: [place] CV LAB;  Service: Cardiovascular;  Laterality: N/A;  . ATRIAL FIBRILLATION ABLATION N/A 12/26/2018   Procedure: ATRIAL FIBRILLATION ABLATION;  Surgeon: Constance Haw, MD;  Location: [place] CV LAB;  Service: Cardiovascular;  Laterality: N/A;  . BI-VENTRICULAR PACEMAKER UPGRADE N/A 05/08/2012   upgrade to SJM Anthem (CRT-P) by Dr Rayann Heman  . CARDIOVERSION N/A 09/19/2017   Procedure: CARDIOVERSION;  Surgeon: Sanda Klein, MD;  Location: [place] ENDOSCOPY;  Service: Cardiovascular;  Laterality: N/A;  . CARDIOVERSION N/A 10/01/2018   Procedure: CARDIOVERSION;  Surgeon: Donato Heinz, MD;  Location: [facility] ENDOSCOPY;  Service: Endoscopy;  Laterality: N/A;  . PACEMAKER INSERTION  1994; 2002; 05/08/2012  . RIGHT/LEFT HEART CATH AND CORONARY ANGIOGRAPHY N/A 06/05/2019   Procedure: RIGHT/LEFT HEART CATH AND CORONARY ANGIOGRAPHY;  Surgeon: Jolaine Artist, MD;  Location: [place] CV LAB;  Service: Cardiovascular;  Laterality: N/A;  . TONSILLECTOMY  ~ 1959  . TUBAL LIGATION  1984?     Current Outpatient Medications  Medication Sig Dispense Refill  . albuterol (PROVENTIL) (5 MG/ML) 0.5% nebulizer solution Take 2.5 mg by nebulization every 6 (six) hours as needed for wheezing or shortness of breath.    . allopurinol (ZYLOPRIM) 100 MG tablet Take 50 mg by mouth daily.    Marland Kitchen ALPRAZolam (XANAX) 0.25 MG  tablet TAKE 1 TABLET (0.25 MG TOTAL) BY MOUTH 2 (TWO) TIMES DAILY AS NEEDED FOR ANXIETY 30 tablet 1  . AMBULATORY NON FORMULARY MEDICATION Medication Name: 100% O2 15 leters a min for 15-20 mins  Via Nasal cannual 1 Bottle 2  . amitriptyline (ELAVIL) 10 MG tablet Take 10 mg by mouth daily as needed (migraine).     Marland Kitchen apixaban (ELIQUIS) 5 MG TABS tablet Take 5 mg by mouth 2 (two) times daily.    . colchicine 0.6 MG tablet Take 1 tablet (0.6 mg total) by mouth 2 (two) times daily as needed (for gout flare ups.). 60 tablet 1  . dapagliflozin propanediol (FARXIGA) 10 MG TABS tablet Take 1 tablet (10 mg total) by mouth daily before breakfast. 30 tablet 6  . digoxin (LANOXIN) 0.125 MG tablet Take 1 tablet (0.125 mg total) by mouth daily. 30 tablet 6  . diphenhydrAMINE (SOMINEX) 25 MG tablet Take 25 mg by mouth at bedtime as needed for sleep.     Marland Kitchen FLUoxetine (PROZAC) 20 MG capsule Take 20 mg by mouth daily.    Marland Kitchen guaiFENesin (MUCINEX) 600 MG 12 hr tablet Take 600 mg by mouth as needed for cough or to loosen phlegm.     Marland Kitchen ipratropium-albuterol (DUONEB) 0.5-2.5 (3) MG/3ML SOLN USE 1 VIAL (3ML) BY NEBULIZER EVERY 4 TO 6 HOURS AS NEEDED 270 mL 0  . losartan (COZAAR) 25 MG tablet Take 25 mg by mouth daily.    . Melatonin 10 MG TABS Take 10 mg by mouth at bedtime as needed.    . metolazone (ZAROXOLYN) 2.5 MG tablet Take 1 tablet (2.5 mg total) by mouth daily. 10 tablet 0  . montelukast (SINGULAIR) 10 MG tablet TAKE 1 TABLET (10 MG TOTAL) BY MOUTH AT BEDTIME. 60 tablet 2  . potassium chloride SA (KLOR-CON M20) 20 MEQ tablet Take 1 tablet (20 mEq total) by mouth 2 (two) times daily. Take 56meq (2 tablets) when you take metolazone 90 tablet 3  . Spacer/Aero-Holding Chambers (AEROCHAMBER MV) inhaler Use as instructed with Symbicort 1 each 0  . spironolactone (ALDACTONE) 25 MG tablet Take 1 tablet (25 mg total) by mouth at bedtime. 30 tablet 6  . SYMBICORT 160-4.5 MCG/ACT inhaler INHALE 2 PUFFS BY MOUTH INTO THE  LUNGS 2 (TWO) TIMES DAILY. 10.2 g 1  . tiotropium (SPIRIVA) 18 MCG inhalation capsule Place 18 mcg into inhaler and inhale daily as needed (shortness of breath).     . torsemide (DEMADEX) 20 MG tablet Take 3 tablets (60 mg total) by mouth 2 (two) times daily. 180 tablet 3  . traMADol (ULTRAM) 50 MG tablet Take 25 mg by mouth every 6 (six) hours as needed.     No current facility-administered medications for this visit.    Allergies:   Prednisone   Social History:  The patient  reports that she quit smoking about 16 months ago. Her smoking use included cigarettes. She has a 15.00 pack-year smoking history. She has never used smokeless tobacco. She reports current alcohol use of about 2.0 standard drinks  of alcohol per week. She reports that she does not use drugs.   Family History:  The patient's family history includes Cancer (age of onset: 48) in her sister; Cardiomyopathy in her sister; Dementia in her mother; Diabetes in her brother and another family member; Heart disease in her sister.   ROS:  Please see the history of present illness.   Otherwise, review of systems is positive for none.   All other systems are reviewed and negative.   PHYSICAL EXAM: VS:  BP (!) 98/56   Pulse 63   Ht 5' (1.524 m)   Wt 127 lb (57.6 kg)   LMP  (LMP Unknown)   BMI 24.80 kg/m  , BMI Body mass index is 24.8 kg/m. GEN: Well nourished, well developed, in no acute distress  HEENT: normal  Neck: no JVD, carotid bruits, or masses Cardiac: RRR; no murmurs, rubs, or gallops,no edema  Respiratory:  clear to auscultation bilaterally, normal work of breathing GI: soft, nontender, nondistended, + BS MS: no deformity or atrophy  Skin: warm and dry, device site well healed Neuro:  Strength and sensation are intact Psych: euthymic mood, full affect  EKG:  EKG is not ordered today. Personal review of the ekg ordered 10/06/19 shows atrial sensed, ventricular paced  Personal review of the device interrogation  today. Results in [place]: 01/26/2019: Magnesium 2.1 07/24/2019: ALT 25; TSH 2.40 11/05/2019: B Natriuretic Peptide 907.2; BUN 23; Creatinine, Ser 1.21; Hemoglobin 14.3; Platelets 204; Potassium 3.5; Sodium 139    Lipid Panel     Component Value Date/Time   CHOL 211 (H) 07/24/2019 1511   TRIG 125 07/24/2019 1511   HDL 66 07/24/2019 1511   CHOLHDL 3.2 07/24/2019 1511   VLDL 8 06/21/2017 0143   LDLCALC 121 (H) 07/24/2019 1511   LDLDIRECT 149.0 06/02/2015 1351     Wt Readings from Last 3 Encounters:  11/30/19 127 lb (57.6 kg)  11/05/19 127 lb 6 oz (57.8 kg)  10/05/19 129 lb (58.5 kg)    Ejaculatory dysfunction AN:  1.  Chronic systolic heart failure: Currently on optimal medical therapy for heart failure.  Ejection fraction 20 to 25% based on echo in [place].  Status post [facility] Jude CRT-P.  Device is reached ERI.  We Maisen Klingler thus plan for generator change.  Risks and benefits of been discussed which include bleeding and infection.  She understands these risks and is agreed to the procedure.    2.  Hypertrophic cardiomyopathy: Stable on most recent echo.  Plan per primary cardiology.  3.  Hypertension: well controlled  4.  Persistent atrial fibrillation/flutter: Status post repeat ablation 12/26/2018.  CHA2DS2-VASc of 4.  Currently on Eliquis.  Low AF burden on device interrogation.  Current medicines are reviewed at length with the patient today.   The patient does not have concerns regarding her medicines.  The following changes were made today: None  Labs/ tests ordered today include:  Orders Placed This Encounter  Procedures  . Basic metabolic panel  . CBC  . CUP PACEART INCLINIC DEVICE CHECK    Disposition:   FU with Bowen Goyal 3 month  Signed, Barney Gertsch Meredith Leeds, MD  11/30/2019 2:41 PM     [place] [address] [place] [address] [phone] (office) [phone] (fax)

## 2019-12-01 LAB — BASIC METABOLIC PANEL
BUN/Creatinine Ratio: 28 (ref 12–28)
BUN: 30 mg/dL — ABNORMAL HIGH (ref 8–27)
CO2: 28 mmol/L (ref 20–29)
Calcium: 9.6 mg/dL (ref 8.7–10.3)
Chloride: 100 mmol/L (ref 96–106)
Creatinine, Ser: 1.08 mg/dL — ABNORMAL HIGH (ref 0.57–1.00)
GFR calc Af Amer: 61 mL/min/{1.73_m2} (ref >59)
GFR calc non Af Amer: 53 mL/min/{1.73_m2} — ABNORMAL LOW (ref >59)
Glucose: 75 mg/dL (ref 65–99)
Potassium: 3.4 mmol/L — ABNORMAL LOW (ref 3.5–5.2)
Sodium: 143 mmol/L (ref 134–144)

## 2019-12-01 MED FILL — TORSEMIDE 20 MG TABLET: 20 | 30 days supply | Qty: 180 | Fill #0

## 2019-12-08 ENCOUNTER — Other Ambulatory Visit (HOSPITAL_BASED_OUTPATIENT_CLINIC_OR_DEPARTMENT_OTHER): Payer: Self-pay | Admitting: Internal Medicine

## 2019-12-08 ENCOUNTER — Ambulatory Visit: Payer: 59 | Attending: Internal Medicine

## 2019-12-08 DIAGNOSIS — Z23 Encounter for immunization: Secondary | ICD-10-CM

## 2019-12-08 MED FILL — PFIZER-BIONTECH COVID-19 VA: 30 | 1 days supply | Qty: 0 | Fill #0

## 2019-12-08 NOTE — Progress Notes (Signed)
   Covid-19 Vaccination Clinic  Name:  Angela Shepherd    MRN: [id number] DOB: April 28, 1950  12/08/2019  [name] was observed post Covid-19 immunization for 15 minutes without incident. She was provided with Vaccine Information Sheet and instruction to access the V-Safe system.   [name] was instructed to call 911 with any severe reactions post vaccine: Marland Kitchen Difficulty breathing  . Swelling of face and throat  . A fast heartbeat  . A bad rash all over body  . Dizziness and weakness   Immunizations Administered    Name Date Dose VIS Date Route   Pfizer COVID-19 Vaccine 12/08/2019  1:31 PM 0.3 mL 10/28/2019 Intramuscular   Manufacturer: [place]   Lot: [id number]   [place]: [id number]

## 2019-12-11 ENCOUNTER — Other Ambulatory Visit (HOSPITAL_COMMUNITY): Payer: Self-pay | Admitting: Internal Medicine

## 2019-12-11 MED FILL — ALPRAZolam 0.25 MG TABS: 0.25 | 15 days supply | Qty: 30 | Fill #1

## 2019-12-11 MED FILL — ELIQUIS 5 MG TABLET: 5 | 30 days supply | Qty: 60 | Fill #0

## 2019-12-11 MED FILL — FARXIGA 10 MG TABLET: 10 | 30 days supply | Qty: 30 | Fill #5

## 2019-12-14 ENCOUNTER — Other Ambulatory Visit (HOSPITAL_COMMUNITY)
Admission: RE | Admit: 2019-12-14 | Discharge: 2019-12-14 | Disposition: A | Discharge status: Home / Self Care | Payer: 59 | Source: Ambulatory Visit | Attending: Cardiology | Admitting: Cardiology

## 2019-12-14 DIAGNOSIS — Z20822 Contact with and (suspected) exposure to covid-19: Secondary | ICD-10-CM | POA: Diagnosis not present

## 2019-12-14 DIAGNOSIS — Z01812 Encounter for preprocedural laboratory examination: Secondary | ICD-10-CM | POA: Diagnosis not present

## 2019-12-14 LAB — SARS CORONAVIRUS 2 (TAT 6-24 HRS): SARS Coronavirus 2: NEGATIVE

## 2019-12-15 NOTE — Progress Notes (Addendum)
Instructed patient on the following items: Arrival time 2:00 Can have foods up until 8:00 No meds AM of procedure Responsible person to drive you home and stay with you for 24 hrs Wash with special soap night before and morning of procedure

## 2019-12-16 ENCOUNTER — Other Ambulatory Visit: Payer: Self-pay | Admitting: Student

## 2019-12-16 ENCOUNTER — Encounter (HOSPITAL_COMMUNITY)
Admission: RE | Disposition: A | Discharge status: Home / Self Care | Payer: Self-pay | Source: Home / Self Care | Attending: Cardiology

## 2019-12-16 ENCOUNTER — Ambulatory Visit (HOSPITAL_COMMUNITY)
Admission: RE | Admit: 2019-12-16 | Discharge: 2019-12-16 | Disposition: A | Discharge status: Home / Self Care | Payer: 59 | Attending: Cardiology | Admitting: Cardiology

## 2019-12-16 DIAGNOSIS — R001 Bradycardia, unspecified: Secondary | ICD-10-CM | POA: Insufficient documentation

## 2019-12-16 DIAGNOSIS — I4819 Other persistent atrial fibrillation: Secondary | ICD-10-CM | POA: Insufficient documentation

## 2019-12-16 DIAGNOSIS — Z7951 Long term (current) use of inhaled steroids: Secondary | ICD-10-CM | POA: Insufficient documentation

## 2019-12-16 DIAGNOSIS — Z79899 Other long term (current) drug therapy: Secondary | ICD-10-CM | POA: Diagnosis not present

## 2019-12-16 DIAGNOSIS — I422 Other hypertrophic cardiomyopathy: Secondary | ICD-10-CM | POA: Diagnosis not present

## 2019-12-16 DIAGNOSIS — Z888 Allergy status to other drugs, medicaments and biological substances status: Secondary | ICD-10-CM | POA: Diagnosis not present

## 2019-12-16 DIAGNOSIS — Z87891 Personal history of nicotine dependence: Secondary | ICD-10-CM | POA: Insufficient documentation

## 2019-12-16 DIAGNOSIS — Z7901 Long term (current) use of anticoagulants: Secondary | ICD-10-CM | POA: Diagnosis not present

## 2019-12-16 DIAGNOSIS — Z8249 Family history of ischemic heart disease and other diseases of the circulatory system: Secondary | ICD-10-CM | POA: Insufficient documentation

## 2019-12-16 DIAGNOSIS — Z4502 Encounter for adjustment and management of automatic implantable cardiac defibrillator: Secondary | ICD-10-CM

## 2019-12-16 DIAGNOSIS — Z7984 Long term (current) use of oral hypoglycemic drugs: Secondary | ICD-10-CM | POA: Diagnosis not present

## 2019-12-16 DIAGNOSIS — Z45010 Encounter for checking and testing of cardiac pacemaker pulse generator [battery]: Secondary | ICD-10-CM | POA: Insufficient documentation

## 2019-12-16 DIAGNOSIS — I110 Hypertensive heart disease with heart failure: Secondary | ICD-10-CM | POA: Insufficient documentation

## 2019-12-16 DIAGNOSIS — I5022 Chronic systolic (congestive) heart failure: Secondary | ICD-10-CM | POA: Insufficient documentation

## 2019-12-16 DIAGNOSIS — E876 Hypokalemia: Secondary | ICD-10-CM

## 2019-12-16 HISTORY — PX: BIV PACEMAKER GENERATOR CHANGEOUT: EP1198

## 2019-12-16 LAB — BASIC METABOLIC PANEL
Anion gap: 17 — ABNORMAL HIGH (ref 5–15)
BUN: 40 mg/dL — ABNORMAL HIGH (ref 8–23)
CO2: 30 mmol/L (ref 22–32)
Calcium: 9.8 mg/dL (ref 8.9–10.3)
Chloride: 88 mmol/L — ABNORMAL LOW (ref 98–111)
Creatinine, Ser: 1.38 mg/dL — ABNORMAL HIGH (ref 0.44–1.00)
GFR, Estimated: 42 mL/min — ABNORMAL LOW (ref >60)
Glucose, Bld: 86 mg/dL (ref 70–99)
Potassium: 2.6 mmol/L — CL (ref 3.5–5.1)
Sodium: 135 mmol/L (ref 135–145)

## 2019-12-16 LAB — CBC
HCT: 47.7 % — ABNORMAL HIGH (ref 36.0–46.0)
Hemoglobin: 15.9 g/dL — ABNORMAL HIGH (ref 12.0–15.0)
MCH: 32.1 pg (ref 26.0–34.0)
MCHC: 33.3 g/dL (ref 30.0–36.0)
MCV: 96.4 fL (ref 80.0–100.0)
Platelets: 268 10*3/uL (ref 150–400)
RBC: 4.95 MIL/uL (ref 3.87–5.11)
RDW: 13.9 % (ref 11.5–15.5)
WBC: 7.6 10*3/uL (ref 4.0–10.5)
nRBC: 0 % (ref 0.0–0.2)

## 2019-12-16 SURGERY — BIV PACEMAKER GENERATOR CHANGEOUT

## 2019-12-16 MED ORDER — POTASSIUM CHLORIDE CRYS ER 20 MEQ PO TBCR: EXTENDED_RELEASE_TABLET | ORAL | 3 refills | Status: DC

## 2019-12-16 MED ORDER — SODIUM CHLORIDE 0.9 % IV SOLN
80.0000 mg | INTRAVENOUS | Status: AC
  Administered 2019-12-16: 80 mg

## 2019-12-16 MED ORDER — LIDOCAINE HCL (PF) 1 % IJ SOLN
INTRAMUSCULAR | Status: AC
  Filled 2019-12-16: qty 30

## 2019-12-16 MED ORDER — CEFAZOLIN SODIUM-DEXTROSE 2-4 GM/100ML-% IV SOLN
INTRAVENOUS | Status: AC
  Filled 2019-12-16: qty 100

## 2019-12-16 MED ORDER — SODIUM CHLORIDE 0.9 % IV SOLN: INTRAVENOUS | Status: DC

## 2019-12-16 MED ORDER — LIDOCAINE HCL (PF) 1 % IJ SOLN
INTRAMUSCULAR | Status: DC | PRN
  Administered 2019-12-16: 60 mL

## 2019-12-16 MED ORDER — POTASSIUM CHLORIDE CRYS ER 20 MEQ PO TBCR
EXTENDED_RELEASE_TABLET | ORAL | Status: AC
  Filled 2019-12-16: qty 2

## 2019-12-16 MED ORDER — SODIUM CHLORIDE 0.9 % IV SOLN
INTRAVENOUS | Status: AC
  Filled 2019-12-16: qty 2

## 2019-12-16 MED ORDER — ACETAMINOPHEN 325 MG PO TABS: 325.0000 mg | ORAL_TABLET | ORAL | Status: DC | PRN

## 2019-12-16 MED ORDER — POTASSIUM CHLORIDE CRYS ER 20 MEQ PO TBCR
40.0000 meq | EXTENDED_RELEASE_TABLET | Freq: Once | ORAL | Status: AC
  Administered 2019-12-16: 40 meq via ORAL

## 2019-12-16 MED ORDER — CHLORHEXIDINE GLUCONATE 4 % EX LIQD
60.0000 mL | Freq: Once | CUTANEOUS | Status: DC
  Filled 2019-12-16: qty 60

## 2019-12-16 MED ORDER — ONDANSETRON HCL 4 MG/2ML IJ SOLN: 4.0000 mg | Freq: Four times a day (QID) | INTRAMUSCULAR | Status: DC | PRN

## 2019-12-16 MED ORDER — CEFAZOLIN SODIUM-DEXTROSE 2-4 GM/100ML-% IV SOLN
2.0000 g | INTRAVENOUS | Status: AC
  Administered 2019-12-16: 2 g via INTRAVENOUS

## 2019-12-16 MED FILL — POTASSIUM CHLORIDE CRYS ER: 20 | 30 days supply | Qty: 90 | Fill #0

## 2019-12-16 SURGICAL SUPPLY — 5 items
CABLE SURGICAL S-101-97-12 (CABLE) ×2 IMPLANT
PACEMAKER ALLR CRT-P RF PM3222 (Pacemaker) ×1 IMPLANT
PAD PRO RADIOLUCENT 2001M-C (PAD) ×2 IMPLANT
PPM ALLURE CRT-P RF PM3222 (Pacemaker) ×2 IMPLANT
TRAY PACEMAKER INSERTION (PACKS) ×2 IMPLANT

## 2019-12-16 NOTE — Progress Notes (Signed)
  Paged for hypokalemia.  Gen change so OK to proceed.   Pt last took torsemide yesterday am (60 mg daily), last potassium yesterday evening (20 meq BID).   Last metolazone 11/29, and she took extra 40 meq of K as directed.   Discussed with Dr. Curt Bears.   Pt will take 40 meq of K post procedure.    Pt will take additional 40 meq of K this evening.   Pt will increase chronic potassium dosing to 40 meq q am and 20 meq q pm, continue extra 40 meq on metolazone days.   Will repeat BMET Friday am for further adjustment prn.   Legrand Como [address] [place], PA-C  12/16/2019 4:20 PM

## 2019-12-16 NOTE — Interval H&P Note (Signed)
History and Physical Interval Note:  12/16/2019 2:13 PM  Angela Shepherd  has presented today for surgery, with the diagnosis of cardiomyopathy.  The various methods of treatment have been discussed with the patient and family. After consideration of risks, benefits and other options for treatment, the patient has consented to  Procedure(s): [place] (N/A) as a surgical intervention.  The patient's history has been reviewed, patient examined, no change in status, stable for surgery.  I have reviewed the patient's chart and labs.  Questions were answered to the patient's satisfaction.     Ahana Najera [employer]

## 2019-12-16 NOTE — Progress Notes (Addendum)
Per Andy,PA client needs to take 3meq K+ at home 4 hours after dose here; client notified

## 2019-12-16 NOTE — Discharge Instructions (Signed)

## 2019-12-17 ENCOUNTER — Encounter (HOSPITAL_COMMUNITY): Payer: Self-pay | Admitting: Cardiology

## 2019-12-18 ENCOUNTER — Encounter (HOSPITAL_COMMUNITY): Payer: Self-pay | Admitting: Cardiology

## 2019-12-18 ENCOUNTER — Other Ambulatory Visit: Payer: 59

## 2019-12-22 ENCOUNTER — Other Ambulatory Visit (HOSPITAL_COMMUNITY): Payer: Self-pay | Admitting: Internal Medicine

## 2019-12-22 MED FILL — metOLazone 2.5 MG TABS: 2.5 | 60 days supply | Qty: 10 | Fill #0

## 2019-12-22 MED FILL — SPIRONOLACTONE 25 MG TABS: 25 | 30 days supply | Qty: 30 | Fill #5

## 2019-12-22 MED FILL — DIGOXIN 0.125 MG TABLET: 125 | 30 days supply | Qty: 30 | Fill #5

## 2019-12-25 ENCOUNTER — Ambulatory Visit (HOSPITAL_BASED_OUTPATIENT_CLINIC_OR_DEPARTMENT_OTHER)
Admission: RE | Admit: 2019-12-25 | Discharge: 2019-12-25 | Disposition: A | Discharge status: Home / Self Care | Payer: 59 | Source: Ambulatory Visit | Attending: Internal Medicine | Admitting: Internal Medicine

## 2019-12-25 ENCOUNTER — Other Ambulatory Visit: Payer: Self-pay | Admitting: Student

## 2019-12-25 ENCOUNTER — Other Ambulatory Visit: Payer: Self-pay

## 2019-12-25 DIAGNOSIS — I5042 Chronic combined systolic (congestive) and diastolic (congestive) heart failure: Secondary | ICD-10-CM | POA: Diagnosis not present

## 2019-12-25 DIAGNOSIS — E876 Hypokalemia: Secondary | ICD-10-CM | POA: Diagnosis not present

## 2019-12-25 LAB — ECHOCARDIOGRAM COMPLETE
Area-P 1/2: 4.21 cm2
MV M vel: 4.03 m/s
MV Peak grad: 65 mmHg
P 1/2 time: 518 msec
Radius: 0.6 cm
S' Lateral: 4.21 cm
Single Plane A4C EF: 39.2 %

## 2019-12-26 LAB — BASIC METABOLIC PANEL
BUN/Creatinine Ratio: 21 (ref 12–28)
BUN: 26 mg/dL (ref 8–27)
CO2: 27 mmol/L (ref 20–29)
Calcium: 9.3 mg/dL (ref 8.7–10.3)
Chloride: 98 mmol/L (ref 96–106)
Creatinine, Ser: 1.26 mg/dL — ABNORMAL HIGH (ref 0.57–1.00)
GFR calc Af Amer: 51 mL/min/{1.73_m2} — ABNORMAL LOW (ref >59)
GFR calc non Af Amer: 44 mL/min/{1.73_m2} — ABNORMAL LOW (ref >59)
Glucose: 67 mg/dL (ref 65–99)
Potassium: 4 mmol/L (ref 3.5–5.2)
Sodium: 143 mmol/L (ref 134–144)

## 2019-12-29 ENCOUNTER — Ambulatory Visit (INDEPENDENT_AMBULATORY_CARE_PROVIDER_SITE_OTHER): Payer: 59 | Admitting: Emergency Medicine

## 2019-12-29 ENCOUNTER — Other Ambulatory Visit: Payer: Self-pay

## 2019-12-29 DIAGNOSIS — I420 Dilated cardiomyopathy: Secondary | ICD-10-CM

## 2019-12-29 LAB — CUP PACEART INCLINIC DEVICE CHECK
Battery Remaining Longevity: 81 mo
Battery Voltage: 3.05 V
Brady Statistic RA Percent Paced: 7.5 %
Brady Statistic RV Percent Paced: 99.32 %
Date Time Interrogation Session: 20211221150900
Implantable Lead Implant Date: 19940705
Implantable Lead Implant Date: 19940705
Implantable Lead Implant Date: 20140501
Implantable Lead Location: 753858
Implantable Lead Location: 753859
Implantable Lead Location: 753860
Implantable Lead Model: 4024
Implantable Lead Model: 4524
Implantable Pulse Generator Implant Date: 20211208
Lead Channel Impedance Value: 362.5 Ohm
Lead Channel Impedance Value: 487.5 Ohm
Lead Channel Impedance Value: 675 Ohm
Lead Channel Pacing Threshold Amplitude: 1 V
Lead Channel Pacing Threshold Amplitude: 1.25 V
Lead Channel Pacing Threshold Amplitude: 1.5 V
Lead Channel Pacing Threshold Pulse Width: 0.4 ms
Lead Channel Pacing Threshold Pulse Width: 0.4 ms
Lead Channel Pacing Threshold Pulse Width: 1 ms
Lead Channel Sensing Intrinsic Amplitude: 2.7 mV
Lead Channel Sensing Intrinsic Amplitude: 3.6 mV
Lead Channel Setting Pacing Amplitude: 2 V
Lead Channel Setting Pacing Amplitude: 2.25 V
Lead Channel Setting Pacing Amplitude: 2.5 V
Lead Channel Setting Pacing Pulse Width: 0.4 ms
Lead Channel Setting Pacing Pulse Width: 1 ms
Lead Channel Setting Sensing Sensitivity: 0.7 mV
Pulse Gen Model: 3222
Pulse Gen Serial Number: 3871541

## 2019-12-29 NOTE — Progress Notes (Signed)
Wound check appointment. Steri-strips removed. Wound without redness or edema. Incision edges approximated, wound well healed. Normal device function. Thresholds, sensing, and impedances consistent with implant measurements. Device programmed at chronic settings due to mature leads. Histogram distribution appropriate for patient and level of activity. 5 AMS events with EGMs that show AF with controlled v-rates, longest episode 5 hours 8 minutes. + Eliquis. No high ventricular rates noted. Patient educated about wound care, arm mobility, lifting restrictions. ROV in 3 months with implanting physician. Enrolled in remote follow-up and next remote 03/16/20.

## 2019-12-30 MED FILL — TORSEMIDE 20 MG TABLET: 20 | 30 days supply | Qty: 180 | Fill #1

## 2020-01-04 ENCOUNTER — Other Ambulatory Visit (HOSPITAL_COMMUNITY): Payer: 59

## 2020-01-04 MED FILL — ALBUTEROL SULFATE HFA 108 (: 108 (90 BAS | 25 days supply | Qty: 18 | Fill #3

## 2020-01-04 MED FILL — SYMBICORT 160-4.5 MCG INH: 160-4.5 | 30 days supply | Qty: 10 | Fill #1

## 2020-01-19 ENCOUNTER — Other Ambulatory Visit: Payer: Self-pay | Admitting: Pulmonary Disease

## 2020-01-19 ENCOUNTER — Other Ambulatory Visit: Payer: Self-pay | Admitting: Family Medicine

## 2020-01-19 MED FILL — FARXIGA 10 MG TABLET: 10 | 30 days supply | Qty: 30 | Fill #6

## 2020-01-19 MED FILL — SPIRONOLACTONE 25 MG TABS: 25 | 30 days supply | Qty: 30 | Fill #6

## 2020-01-19 NOTE — Telephone Encounter (Signed)
Xanax last rx 10/27/19 #30 1RF LOV: 07/24/19 CPE

## 2020-01-20 ENCOUNTER — Other Ambulatory Visit: Payer: Self-pay | Admitting: Physician Assistant

## 2020-01-20 MED FILL — ALPRAZolam 0.25 MG TABS: 0.25 | 15 days supply | Qty: 30 | Fill #0

## 2020-01-27 MED FILL — DIGOXIN 0.125 MG TABLET: 125 | 30 days supply | Qty: 30 | Fill #6

## 2020-02-01 MED FILL — TORSEMIDE 20 MG TABLET: 20 | 30 days supply | Qty: 180 | Fill #2

## 2020-02-09 ENCOUNTER — Telehealth: Payer: Self-pay | Admitting: Pulmonary Disease

## 2020-02-10 ENCOUNTER — Other Ambulatory Visit (HOSPITAL_COMMUNITY): Payer: Self-pay | Admitting: Internal Medicine

## 2020-02-10 ENCOUNTER — Other Ambulatory Visit: Payer: Self-pay

## 2020-02-10 ENCOUNTER — Encounter (HOSPITAL_COMMUNITY): Payer: Self-pay | Admitting: Internal Medicine

## 2020-02-10 ENCOUNTER — Ambulatory Visit (HOSPITAL_COMMUNITY)
Admission: RE | Admit: 2020-02-10 | Discharge: 2020-02-10 | Disposition: A | Discharge status: Home / Self Care | Payer: 59 | Source: Ambulatory Visit | Attending: Internal Medicine | Admitting: Internal Medicine

## 2020-02-10 VITALS — BP 110/70 | HR 70 | Wt 126.0 lb

## 2020-02-10 DIAGNOSIS — N1832 Chronic kidney disease, stage 3b: Secondary | ICD-10-CM | POA: Diagnosis not present

## 2020-02-10 DIAGNOSIS — I4819 Other persistent atrial fibrillation: Secondary | ICD-10-CM | POA: Insufficient documentation

## 2020-02-10 DIAGNOSIS — N1831 Chronic kidney disease, stage 3a: Secondary | ICD-10-CM | POA: Insufficient documentation

## 2020-02-10 DIAGNOSIS — Z87891 Personal history of nicotine dependence: Secondary | ICD-10-CM | POA: Insufficient documentation

## 2020-02-10 DIAGNOSIS — Z8249 Family history of ischemic heart disease and other diseases of the circulatory system: Secondary | ICD-10-CM | POA: Insufficient documentation

## 2020-02-10 DIAGNOSIS — J449 Chronic obstructive pulmonary disease, unspecified: Secondary | ICD-10-CM | POA: Insufficient documentation

## 2020-02-10 DIAGNOSIS — I421 Obstructive hypertrophic cardiomyopathy: Secondary | ICD-10-CM | POA: Diagnosis not present

## 2020-02-10 DIAGNOSIS — Z7951 Long term (current) use of inhaled steroids: Secondary | ICD-10-CM | POA: Insufficient documentation

## 2020-02-10 DIAGNOSIS — I5042 Chronic combined systolic (congestive) and diastolic (congestive) heart failure: Secondary | ICD-10-CM | POA: Insufficient documentation

## 2020-02-10 DIAGNOSIS — Z79899 Other long term (current) drug therapy: Secondary | ICD-10-CM | POA: Diagnosis not present

## 2020-02-10 DIAGNOSIS — Z7901 Long term (current) use of anticoagulants: Secondary | ICD-10-CM | POA: Insufficient documentation

## 2020-02-10 DIAGNOSIS — Z95810 Presence of automatic (implantable) cardiac defibrillator: Secondary | ICD-10-CM | POA: Diagnosis not present

## 2020-02-10 LAB — BASIC METABOLIC PANEL
Anion gap: 13 (ref 5–15)
BUN: 37 mg/dL — ABNORMAL HIGH (ref 8–23)
CO2: 28 mmol/L (ref 22–32)
Calcium: 9 mg/dL (ref 8.9–10.3)
Chloride: 97 mmol/L — ABNORMAL LOW (ref 98–111)
Creatinine, Ser: 1.14 mg/dL — ABNORMAL HIGH (ref 0.44–1.00)
GFR, Estimated: 52 mL/min — ABNORMAL LOW (ref >60)
Glucose, Bld: 93 mg/dL (ref 70–99)
Potassium: 3.7 mmol/L (ref 3.5–5.1)
Sodium: 138 mmol/L (ref 135–145)

## 2020-02-10 LAB — CBC
HCT: 43.7 % (ref 36.0–46.0)
Hemoglobin: 14.4 g/dL (ref 12.0–15.0)
MCH: 31.8 pg (ref 26.0–34.0)
MCHC: 33 g/dL (ref 30.0–36.0)
MCV: 96.5 fL (ref 80.0–100.0)
Platelets: 279 10*3/uL (ref 150–400)
RBC: 4.53 MIL/uL (ref 3.87–5.11)
RDW: 14.6 % (ref 11.5–15.5)
WBC: 8.2 10*3/uL (ref 4.0–10.5)
nRBC: 0 % (ref 0.0–0.2)

## 2020-02-10 LAB — BRAIN NATRIURETIC PEPTIDE: B Natriuretic Peptide: 980.8 pg/mL — ABNORMAL HIGH (ref 0.0–100.0)

## 2020-02-10 MED ORDER — ALBUTEROL SULFATE HFA 108 (90 BASE) MCG/ACT IN AERS: 1.0000 | INHALATION_SPRAY | Freq: Four times a day (QID) | RESPIRATORY_TRACT | 0 refills | Status: DC | PRN

## 2020-02-10 MED ORDER — METOLAZONE 2.5 MG PO TABS: 2.5000 mg | ORAL_TABLET | ORAL | 0 refills | Status: DC

## 2020-02-10 MED ORDER — BUDESONIDE-FORMOTEROL FUMARATE 160-4.5 MCG/ACT IN AERO: 2.0000 | INHALATION_SPRAY | Freq: Two times a day (BID) | RESPIRATORY_TRACT | 1 refills | Status: DC

## 2020-02-10 MED FILL — ALBUTEROL SULFATE HFA 108 (: 108 (90 BAS | 16 days supply | Qty: 18 | Fill #0

## 2020-02-10 MED FILL — SYMBICORT 160-4.5 MCG INH: 160-4.5 | 30 days supply | Qty: 10 | Fill #0

## 2020-02-10 MED FILL — metOLazone 2.5 MG TABS: 2.5 | 60 days supply | Qty: 10 | Fill #0

## 2020-02-10 NOTE — Progress Notes (Signed)
Advanced HF Clinic Note   Date:  02/10/2020   ID:  Angela Shepherd, DOB [birth year], MRN [id number]  Location: Home  Provider location: [place] [facility] Type of Visit: Established patient  PCP:  Midge Minium, MD  Cardiologist:  No primary care provider on file. Primary HF: [place]  Chief Complaint: Heart Failure follow-up   History of Present Illness:  Angela Shepherd is a 70 y.o. 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 She has a h/o HOCM, systolic HF due to NICM s/p CRT-D, severe COPD and PAF s/p ablation x 2.   Was admitted in December 2011 with acute HF. Echo with EF 30-35%, There was also evidence of ASH with septum of 1.7 PW = 1.9.Cath 12/2009: Normal coronaries. EF 40-45%.    Was seen by EP and noted to have a high percentage of RV pacing (99%) and it was felt this was worsening LV function. Underwent upgrade to [facility]. Jude CRT-P in 5/14.    Seen by [facility] in [facility] and found to be carrier for HOCM. No LVOT obstruction clinically.  Admitted in 6/19 for syncope with SBP 71/59. Arlyce Harman and losartan stopped. Echo with EF 35-40%  She also has atrial fibrillation and had AF ablation x 2 with Dr. Curt Bears 11/18/2017. & 12/26/18.  Admitted in [facility] in 5/21 with acute respiratory failure and delirium.Intubated. With lactic acid 5.3. Found to have R-sided PNA and pulmory edema. Treated with abx and IV lasix. Tikosyn stopped. Echo EF 20-25%.   I last saw her 10/21. Volume up. Torsemide 60 bid restarted. Also started metolazone 2.5 given for prn use.   Echo 12/21 EF 25-30% RV mild HK.   Here for routine f/u with her husband. Says she feels great. Metolazone has helped tremendously. Taking about 1x/week. Breathing much better. Does all ADLs without problem as long as weight < 125. No edema, CP, orthopnea or PND. SBPs 90s. No dizziness.   Cardiac studies Echo in March 2012:  EF 50-55%. Grade 2 diastolic  dysfunction. IVS 1.4cm.  Echo 4/14: EF 40-45%  Echo 4/16: EF 45-50% Echo 6/19 EF 35-40% Mild LVH no LVOT obstruction  Echo 05/2019 EF 20-25% at [facility]  Cath 06/05/19 No CAD. EF 20% Ao = 92/60 LV =  89/19 RA = 10 RV = 68/12 PA = 64/25 (39) PCW = 23 Fick cardiac output/index = 2.5/1.6 PVR = 6.5 WU Ao sat = 99% PA sat = 57%, 58% PAPi = 3.9  PFTs 7/21 FEV1 0.88L FVC 1.56 DLCO 41%    Past Medical History:  Diagnosis Date  . Bradycardia    a. initial PPM placed 1994 at [facility] with gen change 2002. b. 1*AVB & intermittent 2nd degree AVB + CHF --> upgrade to [facility]. Jude BiV PPM 05/08/12.  . Chronic systolic CHF (congestive heart failure) ([place])    a. EF 30-35% dx in 2011 -> most recent 40-45% by echo 04/2012. b. s/p upgrade to BiV-PPM 05/08/12. c. Med titration limited by hypotension.  . Cluster headache syndrome   . Hypertr obst cardiomyop   . Hypokalemia   . Hypotension   . Paroxysmal atrial fibrillation (HCC)    a. identified on PPM interrogation b. longest episode 1 hour; if burden increases, will need [place]   . Pneumonia 2008  . Sleep apnea    Past Surgical History:  Procedure Laterality Date  . ATRIAL FIBRILLATION ABLATION N/A 11/15/2017   Procedure: ATRIAL FIBRILLATION ABLATION;  Surgeon:  Constance Haw, MD;  Location: [place] CV [facility];  Service: Cardiovascular;  Laterality: N/A;  . ATRIAL FIBRILLATION ABLATION N/A 12/26/2018   Procedure: ATRIAL FIBRILLATION ABLATION;  Surgeon: Constance Haw, MD;  Location: [place] [facility];  Service: Cardiovascular;  Laterality: N/A;  . BI-VENTRICULAR PACEMAKER UPGRADE N/A 05/08/2012   upgrade to [facility] (CRT-P) by Dr Rayann Heman  . BIV PACEMAKER GENERATOR CHANGEOUT N/A 12/16/2019   Procedure: BIV PACEMAKER GENERATOR CHANGEOUT;  Surgeon: Constance Haw, MD;  Location: [place] [facility];  Service: Cardiovascular;  Laterality: N/A;  . CARDIOVERSION N/A 09/19/2017   Procedure: CARDIOVERSION;  Surgeon: Sanda Klein,  MD;  Location: [place] ENDOSCOPY;  Service: Cardiovascular;  Laterality: N/A;  . CARDIOVERSION N/A 10/01/2018   Procedure: CARDIOVERSION;  Surgeon: Donato Heinz, MD;  Location: [facility] ENDOSCOPY;  Service: Endoscopy;  Laterality: N/A;  . PACEMAKER INSERTION  1994; 2002; 05/08/2012  . RIGHT/LEFT HEART CATH AND CORONARY ANGIOGRAPHY N/A 06/05/2019   Procedure: RIGHT/LEFT HEART CATH AND CORONARY ANGIOGRAPHY;  Surgeon: Jolaine Artist, MD;  Location: [place] [facility];  Service: Cardiovascular;  Laterality: N/A;  . TONSILLECTOMY  ~ 1959  . TUBAL LIGATION  1984?     Current Outpatient Medications  Medication Sig Dispense Refill  . acetaminophen (TYLENOL) 500 MG tablet Take 500-1,000 mg by mouth every 6 (six) hours as needed (for pain).    Marland Kitchen albuterol (VENTOLIN HFA) 108 (90 Base) MCG/ACT inhaler Inhale 1-2 puffs into the lungs every 6 (six) hours as needed for wheezing or shortness of breath. 18 g 0  . allopurinol (ZYLOPRIM) 100 MG tablet Take 50 mg by mouth daily as needed (gout flares.).     Marland Kitchen ALPRAZolam (XANAX) 0.25 MG tablet TAKE 1 TABLET (0.25 MG TOTAL) BY MOUTH 2 (TWO) TIMES DAILY AS NEEDED FOR ANXIETY 30 tablet 0  . AMBULATORY NON FORMULARY MEDICATION Medication Name: 100% O2 15 leters a min for 15-20 mins  Via Nasal cannual 1 Bottle 2  . amitriptyline (ELAVIL) 10 MG tablet Take 10 mg by mouth daily as needed (migraine).     . budesonide-formoterol (SYMBICORT) 160-4.5 MCG/ACT inhaler Inhale 2 puffs into the lungs 2 (two) times daily. 10.2 g 1  . colchicine 0.6 MG tablet Take 1 tablet (0.6 mg total) by mouth 2 (two) times daily as needed (for gout flare ups.). 60 tablet 1  . dapagliflozin propanediol (FARXIGA) 10 MG TABS tablet Take 1 tablet (10 mg total) by mouth daily before breakfast. 30 tablet 6  . digoxin (LANOXIN) 0.125 MG tablet Take 1 tablet (0.125 mg total) by mouth daily. 30 tablet 6  . diphenhydrAMINE (SOMINEX) 25 MG tablet Take 50 mg by mouth at bedtime.    Marland Kitchen ELIQUIS 5 MG TABS  tablet TAKE 1 TABLET BY MOUTH TWICE DAILY 60 tablet 2  . FLUoxetine (PROZAC) 20 MG capsule Take 20 mg by mouth daily.    Marland Kitchen guaiFENesin (MUCINEX) 600 MG 12 hr tablet Take 600 mg by mouth as needed for cough or to loosen phlegm.     Marland Kitchen ipratropium-albuterol (DUONEB) 0.5-2.5 (3) MG/3ML SOLN USE 1 VIAL (3ML) BY NEBULIZER EVERY 4 TO 6 HOURS AS NEEDED 270 mL 0  . losartan (COZAAR) 25 MG tablet Take 25 mg by mouth daily as needed (elevated blood pressure.).     Marland Kitchen Melatonin 10 MG TABS Take 10 mg by mouth at bedtime as needed (sleep.).     Derrill Memo ON 02/15/2020] metolazone (ZAROXOLYN) 2.5 MG tablet TAKE 1 TABLET (2.5 MG  TOTAL) BY MOUTH EVERY MONDAY. 10 tablet 0  . montelukast (SINGULAIR) 10 MG tablet TAKE 1 TABLET (10 MG TOTAL) BY MOUTH AT BEDTIME. 60 tablet 2  . potassium chloride SA (KLOR-CON M20) 20 MEQ tablet Take 40 meq (2 tablets) every morning, and 20 meq (1 tablet) every evening. Take 61meq (2 tablets) when you take metolazone 100 tablet 3  . Spacer/Aero-Holding Chambers (AEROCHAMBER MV) inhaler Use as instructed with Symbicort 1 each 0  . spironolactone (ALDACTONE) 25 MG tablet Take 1 tablet (25 mg total) by mouth at bedtime. 30 tablet 6  . torsemide (DEMADEX) 20 MG tablet Take 3 tablets (60 mg total) by mouth 2 (two) times daily. 180 tablet 3   No current facility-administered medications for this encounter.    Allergies:   Prednisone   Social History:  The patient  reports that she quit smoking about 19 months ago. Her smoking use included cigarettes. She has a 15.00 pack-year smoking history. She has never used smokeless tobacco. She reports current alcohol use of about 2.0 standard drinks of alcohol per week. She reports that she does not use drugs.   Family History:  The patient's family history includes Cancer (age of onset: 59) in her sister; Cardiomyopathy in her sister; Dementia in her mother; Diabetes in her brother and another family member; Heart disease in her sister.   ROS:  Please  see the history of present illness.   All other systems are personally reviewed and negative.   Vitals:   02/10/20 1456  BP: 110/70  Pulse: 70  SpO2: 96%  Weight: 57.2 kg (126 lb)    Exam:   General:  Well appearing. No resp difficulty HEENT: normal Neck: supple. no JVD. Carotids 2+ bilat; no bruits. No lymphadenopathy or thryomegaly appreciated. Cor: PMI nondisplaced. Regular rate & rhythm. No rubs, gallops or murmurs. Lungs: clear Abdomen: soft, nontender, nondistended. No hepatosplenomegaly. No bruits or masses. Good bowel sounds. Extremities: no cyanosis, clubbing, rash, edema Neuro: alert & orientedx3, cranial nerves grossly intact. moves all 4 extremities w/o difficulty. Affect pleasant   Recent Labs: 07/24/2019: ALT 25; TSH 2.40 11/05/2019: B Natriuretic Peptide 907.2 12/16/2019: Hemoglobin 15.9; Platelets 268 12/25/2019: BUN 26; Creatinine, Ser 1.26; Potassium 4.0; Sodium 143  Personally reviewed   Wt Readings from Last 3 Encounters:  02/10/20 57.2 kg (126 lb)  12/16/19 54.4 kg (120 lb)  11/30/19 57.6 kg (127 lb)      ASSESSMENT AND PLAN:  1. Chronic systolic CHF: EF [id number] s/p CRT-P upgrade 05/2012 (due to chronic RV pacing)  - Echo 6/19: LVEF 35-40%, mild MR, Mild TR - Echo in [place] 5/21 EF 20-25% - Cath 5/21. No CAD. EF 20% CI 1.6 - Echo 12/21 EF 25-30% RV mild HK  - Improved NYHA II-III  - Volume status improved. Continue torsemide 60 mg BID and metolazone 2.5 weekly - Bisoprolol stopped due to recent resp failure and low output - Continue losartan 50 daily. BP too soft to switch to [employer] - Continue spiro 25 daily.  - Continue Farxiga 10 mg daily.  - Continue digoxin 0.125mg  daily - Labs today - We again discussed potential need for advanced therapies but lung disease is prohibitive with FEV1 0.88 and DLCO 41% on recent PFTs - ICD interrogated in Clinic. No VT or AF. ERI < 3 months. EP contacted. Personally reviewed  2. H/o HOCM  - No LVOT  gradient - Has been seen in [facility]. All 1st degree relatives have been tested -  No change  3. Tobacco use/COPD - Says she has been abstinent since 7/20 - PFTs as above. Slightly improved off after stopping smoking but prohibitive for advanced therapies - No change  4. Persistent AF/AFL - Follows with Dr. Curt Bears.  - Remains in NSR - Had repeat AF ablation with Dr. Curt Bears 12/26/18.  - Continue Eliquis. No bleeding  5. CKD 3a - stable  - continue [place] with Dr. Joylene Grapes - check labs today  Glori Bickers, MD  3:22 PM   Advanced [facility] Failure [place] [address] [facility] and [place] [address] [phone] (office) [phone] (fax)

## 2020-02-10 NOTE — Telephone Encounter (Signed)
Attempted to call pt. Left message for pt to call back.  

## 2020-02-10 NOTE — Patient Instructions (Signed)
Labs done today, your results will be available in MyChart, we will contact you for abnormal readings.  A refill of your Metolazone was sent to your pharmacy  Your physician recommends that you schedule a follow-up appointment in: 3-4 months with an echocardiogram  Your physician has requested that you have an echocardiogram. Echocardiography is a painless test that uses sound waves to create images of your heart. It provides your doctor with information about the size and shape of your heart and how well your heart's chambers and valves are working. This procedure takes approximately one hour. There are no restrictions for this procedure.  If you have any questions or concerns before your next appointment please send [place] a message through [place] or call our office at [phone].    TO LEAVE A MESSAGE FOR THE NURSE SELECT OPTION 2, PLEASE LEAVE A MESSAGE INCLUDING: . YOUR NAME . DATE OF BIRTH . CALL BACK NUMBER . REASON FOR CALL**this is important as we prioritize the call backs  YOU WILL RECEIVE A CALL BACK THE SAME DAY AS LONG AS YOU CALL BEFORE 4:00 PM

## 2020-02-10 NOTE — Telephone Encounter (Signed)
Patient is returning \\phone  call. Patient phone number is [phone].

## 2020-02-10 NOTE — Telephone Encounter (Signed)
Spoke with pt. She received a message from pharmacy that she needs ov to get further refills. Appt scheduled for 02/19/20. Pt needing refills for Symbicort and Albuterol inhaler until she comes in. Refills sent to [facility] pharmacy. Pt verbalized understanding. Nothing further needed.

## 2020-02-10 NOTE — Addendum Note (Signed)
Encounter addended by: Jolaine Artist, MD on: 02/10/2020 10:46 PM  Actions taken: Level of Service modified, Visit diagnoses modified

## 2020-02-19 ENCOUNTER — Ambulatory Visit (INDEPENDENT_AMBULATORY_CARE_PROVIDER_SITE_OTHER): Payer: 59 | Admitting: Primary Care

## 2020-02-19 ENCOUNTER — Other Ambulatory Visit: Payer: Self-pay | Admitting: Primary Care

## 2020-02-19 ENCOUNTER — Other Ambulatory Visit: Payer: Self-pay

## 2020-02-19 ENCOUNTER — Encounter: Payer: Self-pay | Admitting: Primary Care

## 2020-02-19 VITALS — BP 106/60 | HR 86 | Temp 97.8°F | Ht 62.0 in | Wt 125.8 lb

## 2020-02-19 DIAGNOSIS — I5042 Chronic combined systolic (congestive) and diastolic (congestive) heart failure: Secondary | ICD-10-CM

## 2020-02-19 DIAGNOSIS — J449 Chronic obstructive pulmonary disease, unspecified: Secondary | ICD-10-CM

## 2020-02-19 DIAGNOSIS — G4733 Obstructive sleep apnea (adult) (pediatric): Secondary | ICD-10-CM

## 2020-02-19 DIAGNOSIS — R9389 Abnormal findings on diagnostic imaging of other specified body structures: Secondary | ICD-10-CM

## 2020-02-19 MED ORDER — ALBUTEROL SULFATE HFA 108 (90 BASE) MCG/ACT IN AERS: 1.0000 | INHALATION_SPRAY | Freq: Four times a day (QID) | RESPIRATORY_TRACT | 6 refills | Status: DC | PRN

## 2020-02-19 MED ORDER — BUDESONIDE-FORMOTEROL FUMARATE 160-4.5 MCG/ACT IN AERO: 2.0000 | INHALATION_SPRAY | Freq: Two times a day (BID) | RESPIRATORY_TRACT | 11 refills | Status: DC

## 2020-02-19 MED ORDER — MONTELUKAST SODIUM 10 MG PO TABS: 10.0000 mg | ORAL_TABLET | Freq: Every day | ORAL | 2 refills | Status: DC

## 2020-02-19 MED FILL — MONTELUKAST SOD 10 MG TAB: 10 | 60 days supply | Qty: 60 | Fill #0

## 2020-02-19 NOTE — Progress Notes (Signed)
@Patient  ID: Angela Shepherd, female    DOB: 1950-09-24, 70 y.o.   MRN: [id number]  Chief Complaint  Patient presents with  . Follow-up    Referring provider: Midge Minium, MD  HPI: 70 year old female, former smoker quit in 07/12/18. PMH significant for COPD, OSA, pulmonary edema, chronic allergic rhinitis, hypertension, A. fib, cardiomyopathy, chronic combined systolic and diastolic heart failure, stage II chronic kidney disease.  Patient of Dr. Halford Chessman last seen in office on 11/17/18.  Previous [facility] pulmonary encounter: 11/17/2018 Breathing has been okay.  Not having cough, wheeze, sputum, or chest pain.  Sleeping better.  Uses Bipap nightly.  No issues with mask fit or pressure.  Denies sinus congestion, sore throat, dry mouth, or aerophagia.  CT chest from 11/13/18 reviewed by me shows new lung nodule in RUL.  Remainder of findings stable.   02/19/2020 - Interim hx Patient presents today for regular follow-up. She is doing well, no acute complaints. She needs refill Symbicort, albuterol hfa and Singulair. She also needs order to renew BIPAP supplies.   She was hospitalized in [facility] May 2021 for acute respiratory failure. Found to have right sided pneumonia and pulmonary edema. Echo with EF 20-25%. She follows closely with Dr. Haroldine Laws for combined systolic and diastolic heart failure. Her diuretics have been increased. She is taking Torsemide 60mg  twice daily and Zaroxolyn once a week on Monday. She states that this has tremendously helped her breathing. She has repeat 2D echo scheduled for June and follow-up. She is wearing BIPAP every night without issues. She has oxygen at home that she uses prn.   Repeat CT chest in May showed right upper lobe pulmonary nodule has since resolved   Airview download 01/20/20-02/18/20 30/30 days used; 100% > 4 hours Average usage 11 hours 0 mins Pressure IPAP 12cm h20; EPAP 8cm h20  Airleaks 12.2L/min (95%) AHI 2.8  Allergies   Allergen Reactions  . Prednisone Shortness Of Breath    CHF     Immunization History  Administered Date(s) Administered  . Fluad Quad(high Dose 65+) 10/09/2019  . Hepatitis B, adult 06/14/2015, 07/15/2015  . Influenza Split 10/21/2017  . Influenza, High Dose Seasonal PF 10/23/2018  . Influenza,inj,Quad PF,6+ Mos 10/22/2012, 10/15/2013, 10/18/2014, 10/18/2015, 09/07/2016  . PFIZER(Purple Top)SARS-COV-2 Vaccination 01/22/2019, 02/15/2019, 12/08/2019  . Pneumococcal Conjugate-13 02/08/2006  . Pneumococcal Polysaccharide-23 10/22/2012, 01/18/2017  . Tdap 05/04/2011  . Zoster 10/22/2012    Past Medical History:  Diagnosis Date  . Bradycardia    a. initial PPM placed 1994 at [facility] with gen change 2002. b. 1*AVB & intermittent 2nd degree AVB + CHF --> upgrade to [facility]. Jude BiV PPM 05/08/12.  . Chronic systolic CHF (congestive heart failure) ([place])    a. EF 30-35% dx in 2011 -> most recent 40-45% by echo 04/2012. b. s/p upgrade to BiV-PPM 05/08/12. c. Med titration limited by hypotension.  . Cluster headache syndrome   . Hypertr obst cardiomyop   . Hypokalemia   . Hypotension   . Paroxysmal atrial fibrillation (HCC)    a. identified on PPM interrogation b. longest episode 1 hour; if burden increases, will need [place]   . Pneumonia 2008  . Sleep apnea     Tobacco History: Social History   Tobacco Use  Smoking Status Former Smoker  . Packs/day: 0.50  . Years: 30.00  . Pack years: 15.00  . Types: Cigarettes  . Quit date: 07/12/2018  . Years since quitting: 1.6  Smokeless Tobacco Never Used  Counseling given: Not Answered   Outpatient Medications Prior to Visit  Medication Sig Dispense Refill  . acetaminophen (TYLENOL) 500 MG tablet Take 500-1,000 mg by mouth every 6 (six) hours as needed (for pain).    Marland Kitchen allopurinol (ZYLOPRIM) 100 MG tablet Take 50 mg by mouth daily as needed (gout flares.).     Marland Kitchen ALPRAZolam (XANAX) 0.25 MG tablet TAKE 1 TABLET (0.25 MG TOTAL) BY MOUTH 2 (TWO)  TIMES DAILY AS NEEDED FOR ANXIETY 30 tablet 0  . AMBULATORY NON FORMULARY MEDICATION Medication Name: 100% O2 15 leters a min for 15-20 mins  Via Nasal cannual 1 Bottle 2  . amitriptyline (ELAVIL) 10 MG tablet Take 10 mg by mouth daily as needed (migraine).     . colchicine 0.6 MG tablet Take 1 tablet (0.6 mg total) by mouth 2 (two) times daily as needed (for gout flare ups.). 60 tablet 1  . dapagliflozin propanediol (FARXIGA) 10 MG TABS tablet Take 1 tablet (10 mg total) by mouth daily before breakfast. 30 tablet 6  . digoxin (LANOXIN) 0.125 MG tablet Take 1 tablet (0.125 mg total) by mouth daily. 30 tablet 6  . diphenhydrAMINE (SOMINEX) 25 MG tablet Take 50 mg by mouth at bedtime.    Marland Kitchen ELIQUIS 5 MG TABS tablet TAKE 1 TABLET BY MOUTH TWICE DAILY 60 tablet 2  . FLUoxetine (PROZAC) 20 MG capsule Take 20 mg by mouth daily.    Marland Kitchen guaiFENesin (MUCINEX) 600 MG 12 hr tablet Take 600 mg by mouth as needed for cough or to loosen phlegm.     Marland Kitchen ipratropium-albuterol (DUONEB) 0.5-2.5 (3) MG/3ML SOLN USE 1 VIAL (3ML) BY NEBULIZER EVERY 4 TO 6 HOURS AS NEEDED 270 mL 0  . losartan (COZAAR) 25 MG tablet Take 25 mg by mouth daily as needed (elevated blood pressure.).     Marland Kitchen Melatonin 10 MG TABS Take 10 mg by mouth at bedtime as needed (sleep.).     Marland Kitchen metolazone (ZAROXOLYN) 2.5 MG tablet Take 1 tablet (2.5 mg total) by mouth every Monday. 10 tablet 0  . potassium chloride SA (KLOR-CON M20) 20 MEQ tablet Take 40 meq (2 tablets) every morning, and 20 meq (1 tablet) every evening. Take 25meq (2 tablets) when you take metolazone 100 tablet 3  . Spacer/Aero-Holding Chambers (AEROCHAMBER MV) inhaler Use as instructed with Symbicort 1 each 0  . spironolactone (ALDACTONE) 25 MG tablet Take 1 tablet (25 mg total) by mouth at bedtime. 30 tablet 6  . torsemide (DEMADEX) 20 MG tablet Take 3 tablets (60 mg total) by mouth 2 (two) times daily. 180 tablet 3  . albuterol (VENTOLIN HFA) 108 (90 Base) MCG/ACT inhaler Inhale 1-2 puffs  into the lungs every 6 (six) hours as needed for wheezing or shortness of breath. 18 g 0  . budesonide-formoterol (SYMBICORT) 160-4.5 MCG/ACT inhaler Inhale 2 puffs into the lungs 2 (two) times daily. 10.2 g 1  . montelukast (SINGULAIR) 10 MG tablet TAKE 1 TABLET (10 MG TOTAL) BY MOUTH AT BEDTIME. 60 tablet 2   No facility-administered medications prior to visit.   Review of Systems  Review of Systems  Constitutional: Positive for fatigue.  Respiratory: Negative for cough, chest tightness, shortness of breath and wheezing.   Cardiovascular: Negative for leg swelling.   Physical Exam  BP 106/60 (BP Location: Left Arm, Patient Position: Sitting, Cuff Size: Normal)   Pulse 86   Temp 97.8 F (36.6 C) (Temporal)   Ht 5\' 2"  (1.575 m)   Wt 125 lb  12.8 oz (57.1 kg)   LMP  (LMP Unknown)   SpO2 91%   BMI 23.01 kg/m  Physical Exam Constitutional:      Appearance: Normal appearance.  HENT:     Head: Normocephalic and atraumatic.  Cardiovascular:     Rate and Rhythm: Normal rate.     Comments: No edema Pulmonary:     Effort: Pulmonary effort is normal.     Breath sounds: Wheezing present.  Musculoskeletal:        General: Normal range of motion.  Skin:    General: Skin is warm and dry.  Neurological:     Mental Status: She is alert.  Psychiatric:        Mood and Affect: Mood normal.        Behavior: Behavior normal.        Thought Content: Thought content normal.        Judgment: Judgment normal.      Lab Results:  CBC    Component Value Date/Time   WBC 8.2 02/10/2020 1605   RBC 4.53 02/10/2020 1605   HGB 14.4 02/10/2020 1605   HGB 13.6 02/13/2018 1243   HCT 43.7 02/10/2020 1605   HCT 40.0 02/13/2018 1243   PLT 279 02/10/2020 1605   PLT 238 02/13/2018 1243   MCV 96.5 02/10/2020 1605   MCV 96 02/13/2018 1243   MCH 31.8 02/10/2020 1605   MCHC 33.0 02/10/2020 1605   RDW 14.6 02/10/2020 1605   RDW 13.2 02/13/2018 1243   LYMPHSABS 1,482 07/24/2019 1511   LYMPHSABS  1.3 02/13/2018 1243   MONOABS 0.4 10/01/2018 0856   EOSABS 110 07/24/2019 1511   EOSABS 0.1 02/13/2018 1243   BASOSABS 73 07/24/2019 1511   BASOSABS 0.1 02/13/2018 1243    BMET    Component Value Date/Time   NA 138 02/10/2020 1605   NA 143 12/25/2019 0958   K 3.7 02/10/2020 1605   CL 97 (L) 02/10/2020 1605   CO2 28 02/10/2020 1605   GLUCOSE 93 02/10/2020 1605   BUN 37 (H) 02/10/2020 1605   BUN 26 12/25/2019 0958   CREATININE 1.14 (H) 02/10/2020 1605   CREATININE 1.45 (H) 07/24/2019 1511   CALCIUM 9.0 02/10/2020 1605   GFRNONAA 52 (L) 02/10/2020 1605   GFRAA 51 (L) 12/25/2019 0958    BNP    Component Value Date/Time   BNP 980.8 (H) 02/10/2020 1605   BNP 161.0 (H) 01/20/2015 1647    ProBNP    Component Value Date/Time   PROBNP 1,698.0 (H) 08/05/2017 1307    Imaging: No results found.   Assessment & Plan:   COPD (chronic obstructive pulmonary disease) ([place]) - Stable; No recent exacerbations. Hospitalized in May 2021 for pneumonia/ respiratory failure.  - Maintained on Symbicort 138mcg two puffs twice daily and Singulair 10mg  qhs. She has been off Spiriva for the last year without noticeable change.   OSA (obstructive sleep apnea) - Patient is 100% compliant with BIPAP - Pressure 12/8 cm h20 ; residual AHI 2.8 - Renew supplies with DME  - No changes today   Abnormal CT of the chest - Right upper lobe pulmonary nodule resolved on most recent imaging  Chronic combined systolic (congestive) and diastolic (congestive) heart failure (HCC) - Follows closely with cardiology - Continues Trosemide 60mg  BID and Zaroxolyn 2.5mg  once weekly    Martyn Ehrich, NP 02/19/2020

## 2020-02-19 NOTE — Assessment & Plan Note (Addendum)
-   Follows closely with cardiology - Continues Trosemide 60mg  BID and Zaroxolyn 2.5mg  once weekly

## 2020-02-19 NOTE — Assessment & Plan Note (Signed)
-   Patient is 100% compliant with BIPAP - Pressure 12/8 cm h20 ; residual AHI 2.8 - Renew supplies with DME  - No changes today

## 2020-02-19 NOTE — Assessment & Plan Note (Signed)
-   Right upper lobe pulmonary nodule resolved on most recent imaging

## 2020-02-19 NOTE — Patient Instructions (Addendum)
Right upper lobe pulmonary nodule resolved on most recent CT chest in May 2021  Refill: Symbicort 160 (if not covered, we will try Advair) Albuterol hfa  Singulair   Orders: Renew BIPAP supplies with DME   Follow-up: 1 year with Dr. Halford Chessman or sooner if needed    CPAP and BPAP Information CPAP and BPAP are methods that use air pressure to keep your airways open and to help you breathe well. CPAP and BPAP use different amounts of pressure. Your health care provider will tell you whether CPAP or BPAP would be more helpful for you.  CPAP stands for "continuous positive airway pressure." With CPAP, the amount of pressure stays the same while you breathe in and out.  BPAP stands for "bi-level positive airway pressure." With BPAP, the amount of pressure will be higher when you breathe in (inhale) and lower when you breathe out(exhale). This allows you to take larger breaths. CPAP or BPAP may be used in the hospital, or your health care provider may want you to use it at home. You may need to have a sleep study before your health care provider can order a machine for you to use at home. Why are CPAP and BPAP treatments used? CPAP or BPAP can be helpful if you have:  Sleep apnea.  Chronic obstructive pulmonary disease (COPD).  Heart failure.  Medical conditions that cause muscle weakness, including muscular dystrophy or amyotrophic lateral sclerosis (ALS).  Other problems that cause breathing to be shallow, weak, abnormal, or difficult. CPAP and BPAP are most commonly used for obstructive sleep apnea (OSA) to keep the airways from collapsing when the muscles relax during sleep. How is CPAP or BPAP administered? Both CPAP and BPAP are provided by a small machine with a flexible plastic tube that attaches to a plastic mask that you wear. Air is blown through the mask into your nose or mouth. The amount of pressure that is used to blow the air can be adjusted on the machine. Your health care  provider will set the pressure setting and help you find the best mask for you. When should CPAP or BPAP be used? In most cases, the mask only needs to be worn during sleep. Generally, the mask needs to be worn throughout the night and during any daytime naps. People with certain medical conditions may also need to wear the mask at other times when they are awake. Follow instructions from your health care provider about when to use the machine. What are some tips for using the mask?  Because the mask needs to be snug, some people feel trapped or closed-in (claustrophobic) when first using the mask. If you feel this way, you may need to get used to the mask. One way to do this is to hold the mask loosely over your nose or mouth and then gradually apply the mask more snugly. You can also gradually increase the amount of time that you use the mask.  Masks are available in various types and sizes. If your mask does not fit well, talk with your health care provider about getting a different one. Some common types of masks include: ? Full face masks, which fit over the mouth and nose. ? Nasal masks, which fit over the nose. ? Nasal pillow or prong masks, which fit into the nostrils.  If you are using a mask that fits over your nose and you tend to breathe through your mouth, a chin strap may be applied to help keep your  mouth closed.  Some CPAP and BPAP machines have alarms that may sound if the mask comes off or develops a leak.  If you have trouble with the mask, it is very important that you talk with your health care provider about finding a way to make the mask easier to tolerate. Do not stop using the mask. There could be a negative impact to your health if you stop using the mask.   What are some tips for using the machine?  Place your CPAP or BPAP machine on a secure table or stand near an electrical outlet.  Know where the on/off switch is on the machine.  Follow instructions from your  health care provider about how to set the pressure on your machine and when you should use it.  Do not eat or drink while the CPAP or BPAP machine is on. Food or fluids could get pushed into your lungs by the pressure of the CPAP or BPAP.  For home use, CPAP and BPAP machines can be rented or purchased through home health care companies. Many different brands of machines are available. Renting a machine before purchasing may help you find out which particular machine works well for you. Your insurance may also decide which machine you may get.  Keep the CPAP or BPAP machine and attachments clean. Ask your health care provider for specific instructions. Follow these instructions at home:  Do not use any products that contain nicotine or tobacco, such as cigarettes, e-cigarettes, and chewing tobacco. If you need help quitting, ask your health care provider.  Keep all follow-up visits as told by your health care provider. This is important. Contact a health care provider if:  You have redness or pressure sores on your head, face, mouth, or nose from the mask or head gear.  You have trouble using the CPAP or BPAP machine.  You cannot tolerate wearing the CPAP or BPAP mask.  Someone tells you that you snore even when wearing your CPAP or BPAP. Get help right away if:  You have trouble breathing.  You feel confused. Summary  CPAP and BPAP are methods that use air pressure to keep your airways open and to help you breathe well.  You may need to have a sleep study before your health care provider can order a machine for home use.  If you have trouble with the mask, it is very important that you talk with your health care provider about finding a way to make the mask easier to tolerate. Do not stop using the mask. There could be a negative impact to your health if you stop using the mask.  Follow instructions from your health care provider about when to use the machine. This information is  not intended to replace advice given to you by your health care provider. Make sure you discuss any questions you have with your health care provider. Document Revised: 01/16/2019 Document Reviewed: 01/19/2019 Elsevier Patient Education  2021 [employer].

## 2020-02-19 NOTE — Assessment & Plan Note (Addendum)
-   Stable; No recent exacerbations. Hospitalized in May 2021 for pneumonia/ respiratory failure.  - Maintained on Symbicort 137mcg two puffs twice daily and Singulair 10mg  qhs. She has been off Spiriva for the last year without noticeable change.

## 2020-02-22 ENCOUNTER — Other Ambulatory Visit (HOSPITAL_COMMUNITY): Payer: Self-pay | Admitting: Internal Medicine

## 2020-02-22 MED FILL — ELIQUIS 5 MG TABLET: 5 | 30 days supply | Qty: 60 | Fill #1

## 2020-02-22 MED FILL — SPIRONOLACTONE 25 MG TABS: 25 | 90 days supply | Qty: 90 | Fill #0

## 2020-02-22 MED FILL — FARXIGA 10 MG TABLET: 10 | 30 days supply | Qty: 30 | Fill #0

## 2020-02-22 MED FILL — ALBUTEROL SULFATE HFA 108 (: 108 (90 BAS | 25 days supply | Qty: 18 | Fill #0

## 2020-02-24 NOTE — Progress Notes (Signed)
Reviewed and agree with assessment/plan.   Vineet Sood, MD [facility] 02/24/2020, 11:01 AM Pager:  [phone]  

## 2020-03-03 MED FILL — TORSEMIDE 20 MG TABLET: 20 | 30 days supply | Qty: 180 | Fill #3

## 2020-03-17 ENCOUNTER — Other Ambulatory Visit (HOSPITAL_COMMUNITY): Payer: Self-pay | Admitting: Internal Medicine

## 2020-03-17 MED FILL — DIGOXIN 0.125 MG TABLET: 125 | 30 days supply | Qty: 30 | Fill #0

## 2020-03-21 ENCOUNTER — Other Ambulatory Visit (HOSPITAL_BASED_OUTPATIENT_CLINIC_OR_DEPARTMENT_OTHER): Payer: Self-pay

## 2020-03-28 DIAGNOSIS — I5020 Unspecified systolic (congestive) heart failure: Secondary | ICD-10-CM | POA: Diagnosis not present

## 2020-03-28 DIAGNOSIS — N1832 Chronic kidney disease, stage 3b: Secondary | ICD-10-CM | POA: Diagnosis not present

## 2020-03-28 DIAGNOSIS — I480 Paroxysmal atrial fibrillation: Secondary | ICD-10-CM | POA: Diagnosis not present

## 2020-03-28 DIAGNOSIS — I421 Obstructive hypertrophic cardiomyopathy: Secondary | ICD-10-CM | POA: Diagnosis not present

## 2020-03-29 ENCOUNTER — Other Ambulatory Visit: Payer: Self-pay | Admitting: Internal Medicine

## 2020-03-29 DIAGNOSIS — N1832 Chronic kidney disease, stage 3b: Secondary | ICD-10-CM

## 2020-03-30 ENCOUNTER — Other Ambulatory Visit: Payer: Self-pay | Admitting: Physician Assistant

## 2020-03-30 ENCOUNTER — Other Ambulatory Visit (HOSPITAL_COMMUNITY): Payer: Self-pay | Admitting: Internal Medicine

## 2020-03-30 MED FILL — TORSEMIDE 20 MG TABLET: 20 | 30 days supply | Qty: 180 | Fill #0

## 2020-03-30 MED FILL — FARXIGA 10 MG TABLET: 10 | 30 days supply | Qty: 30 | Fill #1

## 2020-03-31 NOTE — Telephone Encounter (Signed)
Please advise on the refilled on the alprazolam

## 2020-03-31 NOTE — Telephone Encounter (Signed)
Requesting:Xanax 0.25mg  Contract: UDS: Last Visit:07/24/19 Next Visit:08/12/20 Last Refill:01/20/20 30 tabs 0 refills  Please Advise

## 2020-04-01 ENCOUNTER — Other Ambulatory Visit: Payer: Self-pay | Admitting: Family Medicine

## 2020-04-01 MED FILL — ALPRAZolam 0.25 MG TABS: 0.25 | 15 days supply | Qty: 30 | Fill #0

## 2020-04-04 ENCOUNTER — Telehealth: Payer: Self-pay

## 2020-04-04 NOTE — Telephone Encounter (Signed)
Spoke with patient she is having issues with her in home monitor and tech support is going to send her a new adapter in the mail and the patient will call when she receives it

## 2020-04-10 ENCOUNTER — Other Ambulatory Visit (HOSPITAL_BASED_OUTPATIENT_CLINIC_OR_DEPARTMENT_OTHER): Payer: Self-pay

## 2020-04-13 ENCOUNTER — Telehealth: Payer: Self-pay

## 2020-04-13 NOTE — Telephone Encounter (Signed)
The pt states her monitor was not working. I asked her did the monitor go straight to the tower. She states yes. I told her she needs to let it finish updating. Once the monitor finish all the updates then it will let her send the transmission. If the transmission do not go through she can call tech support to get additional help.

## 2020-04-14 ENCOUNTER — Ambulatory Visit (INDEPENDENT_AMBULATORY_CARE_PROVIDER_SITE_OTHER): Payer: 59

## 2020-04-14 DIAGNOSIS — I4819 Other persistent atrial fibrillation: Secondary | ICD-10-CM | POA: Diagnosis not present

## 2020-04-15 ENCOUNTER — Ambulatory Visit (HOSPITAL_BASED_OUTPATIENT_CLINIC_OR_DEPARTMENT_OTHER)
Admission: RE | Admit: 2020-04-15 | Discharge: 2020-04-15 | Disposition: A | Discharge status: Home / Self Care | Payer: 59 | Source: Ambulatory Visit | Attending: Internal Medicine | Admitting: Internal Medicine

## 2020-04-15 ENCOUNTER — Other Ambulatory Visit: Payer: Self-pay

## 2020-04-15 DIAGNOSIS — N183 Chronic kidney disease, stage 3 unspecified: Secondary | ICD-10-CM | POA: Diagnosis not present

## 2020-04-15 DIAGNOSIS — N1832 Chronic kidney disease, stage 3b: Secondary | ICD-10-CM | POA: Insufficient documentation

## 2020-04-15 LAB — CUP PACEART REMOTE DEVICE CHECK
Battery Remaining Longevity: 82 mo
Battery Remaining Percentage: 95.5 %
Battery Voltage: 3.01 V
Brady Statistic AP VP Percent: 27 %
Brady Statistic AP VS Percent: 1 %
Brady Statistic AS VP Percent: 73 %
Brady Statistic AS VS Percent: 1 %
Brady Statistic RA Percent Paced: 26 %
Date Time Interrogation Session: 20220407161845
Implantable Lead Implant Date: 19940705
Implantable Lead Implant Date: 19940705
Implantable Lead Implant Date: 20140501
Implantable Lead Location: 753858
Implantable Lead Location: 753859
Implantable Lead Location: 753860
Implantable Lead Model: 4024
Implantable Lead Model: 4524
Implantable Pulse Generator Implant Date: 20211208
Lead Channel Impedance Value: 360 Ohm
Lead Channel Impedance Value: 490 Ohm
Lead Channel Impedance Value: 650 Ohm
Lead Channel Pacing Threshold Amplitude: 1 V
Lead Channel Pacing Threshold Amplitude: 1.25 V
Lead Channel Pacing Threshold Amplitude: 1.5 V
Lead Channel Pacing Threshold Pulse Width: 0.4 ms
Lead Channel Pacing Threshold Pulse Width: 0.4 ms
Lead Channel Pacing Threshold Pulse Width: 1 ms
Lead Channel Sensing Intrinsic Amplitude: 1.6 mV
Lead Channel Sensing Intrinsic Amplitude: 4.1 mV
Lead Channel Setting Pacing Amplitude: 2 V
Lead Channel Setting Pacing Amplitude: 2.25 V
Lead Channel Setting Pacing Amplitude: 2.5 V
Lead Channel Setting Pacing Pulse Width: 0.4 ms
Lead Channel Setting Pacing Pulse Width: 1 ms
Lead Channel Setting Sensing Sensitivity: 0.7 mV
Pulse Gen Model: 3222
Pulse Gen Serial Number: 3871541

## 2020-04-19 ENCOUNTER — Other Ambulatory Visit (HOSPITAL_COMMUNITY): Payer: Self-pay | Admitting: Internal Medicine

## 2020-04-21 ENCOUNTER — Other Ambulatory Visit (HOSPITAL_BASED_OUTPATIENT_CLINIC_OR_DEPARTMENT_OTHER): Payer: Self-pay

## 2020-04-21 MED FILL — Metolazone Tab 2.5 MG: ORAL | 70 days supply | Qty: 10 | Fill #0 | Status: AC

## 2020-04-22 ENCOUNTER — Other Ambulatory Visit (HOSPITAL_BASED_OUTPATIENT_CLINIC_OR_DEPARTMENT_OTHER): Payer: Self-pay

## 2020-04-22 MED ORDER — METOLAZONE 2.5 MG PO TABS
ORAL_TABLET | ORAL | 0 refills | Status: DC
  Filled 2020-04-22: qty 10, fill #0
  Filled 2020-05-26: qty 10, 70d supply, fill #0

## 2020-04-25 ENCOUNTER — Ambulatory Visit (INDEPENDENT_AMBULATORY_CARE_PROVIDER_SITE_OTHER): Payer: 59 | Admitting: Cardiology

## 2020-04-25 ENCOUNTER — Encounter: Payer: Self-pay | Admitting: Cardiology

## 2020-04-25 ENCOUNTER — Other Ambulatory Visit: Payer: Self-pay

## 2020-04-25 VITALS — BP 100/60 | HR 68 | Ht 62.0 in | Wt 125.0 lb

## 2020-04-25 DIAGNOSIS — I4819 Other persistent atrial fibrillation: Secondary | ICD-10-CM

## 2020-04-25 DIAGNOSIS — R55 Syncope and collapse: Secondary | ICD-10-CM | POA: Diagnosis not present

## 2020-04-25 NOTE — Progress Notes (Signed)
Electrophysiology Office Note   Date:  04/25/2020   ID:  Angela Shepherd, DOB [birth year], MRN [id number]  PCP:  Midge Minium, MD  Cardiologist:  [place] Primary Electrophysiologist:  Constance Haw, MD    No chief complaint on file.    History of Present Illness: Angela Shepherd is a 70 y.o. female who is being seen today for the evaluation of HOCM at the request of Tabori, Aundra Millet, MD. Presenting today for electrophysiology evaluation.    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  She was admitted December 2011 with heart failure and was found to have an ejection fraction of 30 to 35%.  She had a high RV pacing burden and is now status post CRT-P upgrade May 2014.  Echo showed an ejection fraction of 45 to 50%.  She also has a history of atrial fibrillation status post ablation 11/18/2017 with repeat ablation 12/26/2018.  She was hospitalized in [facility] May 2021 with respiratory failure and delirium.  She was intubated.  She was treated with antibiotics and Lasix.  Her dofetilide was stopped at the time.  She was found to have an ejection fraction of 20 to 25%.  Today, denies symptoms of palpitations, chest pain, shortness of breath, orthopnea, PND, lower extremity edema, claudication, dizziness, presyncope, syncope, bleeding, or neurologic sequela. The patient is tolerating medications without difficulties.  Since her generator change she has been doing well.  She has had minimal episodes of atrial fibrillation.  Her burden is less than 1%.  She had 1 to 2-hour episode, but otherwise all short episodes.  She is feeling well and has no complaints..     Past Medical History:  Diagnosis Date  . Bradycardia    a. initial PPM placed [facility] at [facility] with gen change 2002. b. 1*AVB & intermittent 2nd degree AVB + CHF --> upgrade to  [facility]. Jude BiV PPM 05/08/12.  . Chronic systolic CHF (congestive heart failure) ([place])    a. EF 30-35% dx in 2011 -> most recent 40-45% by echo 04/2012. b. s/p upgrade to BiV-PPM 05/08/12. c. Med titration limited by hypotension.  . Cluster headache syndrome   . Hypertr obst cardiomyop   . Hypokalemia   . Hypotension   . Paroxysmal atrial fibrillation (HCC)    a. identified on PPM interrogation b. longest episode 1 hour; if burden increases, [facility] need [place]   . Pneumonia 2008  . Sleep apnea    Past Surgical History:  Procedure Laterality Date  . ATRIAL FIBRILLATION ABLATION N/A 11/15/2017   Procedure: ATRIAL FIBRILLATION ABLATION;  Surgeon: Constance Haw, MD;  Location: [place] [facility];  Service: Cardiovascular;  Laterality: N/A;  . ATRIAL FIBRILLATION ABLATION N/A 12/26/2018   Procedure: ATRIAL FIBRILLATION ABLATION;  Surgeon: Constance Haw, MD;  Location: [place] [facility];  Service: Cardiovascular;  Laterality: N/A;  . BI-VENTRICULAR PACEMAKER UPGRADE N/A 05/08/2012   upgrade to SJM Anthem (CRT-P) by Dr Rayann Heman  . BIV PACEMAKER GENERATOR CHANGEOUT N/A 12/16/2019   Procedure: BIV PACEMAKER GENERATOR CHANGEOUT;  Surgeon: Constance Haw, MD;  Location: [place] [facility];  Service: Cardiovascular;  Laterality: N/A;  . CARDIOVERSION N/A 09/19/2017   Procedure: CARDIOVERSION;  Surgeon: Sanda Klein, MD;  Location: [place] [facility]OS[facility];  Service: Cardiovascular;  Laterality: N/A;  . CARDIOVERSION N/A 10/01/2018   Procedure: CARDIOVERSION;  Surgeon: Donato Heinz, MD;  Location: [facility] ENDOSCOPY;  Service: Endoscopy;  Laterality: N/A;  . PACEMAKER INSERTION  1994; 2002; 05/08/2012  . RIGHT/LEFT HEART CATH AND CORONARY ANGIOGRAPHY N/A 06/05/2019   Procedure: RIGHT/LEFT HEART CATH AND CORONARY ANGIOGRAPHY;  Surgeon: Jolaine Artist, MD;  Location: [place] [facility];  Service: Cardiovascular;  Laterality: N/A;  . TONSILLECTOMY  ~ 1959  . TUBAL LIGATION  1984?     Current  Outpatient Medications  Medication Sig Dispense Refill  . acetaminophen (TYLENOL) 500 MG tablet Take 500-1,000 mg by mouth every 6 (six) hours as needed (for pain).    Marland Kitchen albuterol (VENTOLIN HFA) 108 (90 Base) MCG/ACT inhaler INHALE 1 - 2 PUFFS INTO THE LUNGS EVERY 6 HOURS AS NEEDED FOR WHEEZING OR SHORTNESS OF BREATH 18 g 6  . allopurinol (ZYLOPRIM) 100 MG tablet Take 50 mg by mouth daily as needed (gout flares.).     Marland Kitchen ALPRAZolam (XANAX) 0.25 MG tablet TAKE 1 TABLET BY MOUTH TWICE DAILY AS NEEDED FOR ANXIETY (Patient taking differently: Take by mouth 2 (two) times daily as needed. for anxiety) 30 tablet 0  . AMBULATORY NON FORMULARY MEDICATION Medication Name: 100% O2 15 leters a min for 15-20 mins  Via Nasal cannual 1 Bottle 2  . amitriptyline (ELAVIL) 10 MG tablet Take 10 mg by mouth daily as needed (migraine).     Marland Kitchen amoxicillin (AMOXIL) 500 MG capsule TAKE 4 CAPSULES BY MOUTH 1 HOUR PRIOR TO DENTAL APPOINTMENT 16 capsule 2  . apixaban (ELIQUIS) 5 MG TABS tablet TAKE 1 TABLET BY MOUTH TWICE DAILY 60 tablet 2  . budesonide-formoterol (SYMBICORT) 160-4.5 MCG/ACT inhaler INHALE 2 PUFFS INTO THE LUNGS 2 TIMES DAILY 10.2 g 11  . colchicine 0.6 MG tablet Take 1 tablet (0.6 mg total) by mouth 2 (two) times daily as needed (for gout flare ups.). 60 tablet 1  . COVID-19 mRNA vaccine, Pfizer, 30 MCG/0.3ML injection INJECT AS DIRECTED .3 mL 0  . dapagliflozin propanediol (FARXIGA) 10 MG TABS tablet TAKE 1 TABLET (10 MG TOTAL) BY MOUTH DAILY BEFORE BREAKFAST. 30 tablet 6  . digoxin (LANOXIN) 0.125 MG tablet TAKE 1 TABLET (0.125 MG TOTAL) BY MOUTH DAILY. 30 tablet 6  . diphenhydrAMINE (SOMINEX) 25 MG tablet Take 50 mg by mouth at bedtime.    Marland Kitchen FLUoxetine (PROZAC) 20 MG capsule Take 20 mg by mouth daily.    Marland Kitchen guaiFENesin (MUCINEX) 600 MG 12 hr tablet Take 600 mg by mouth as needed for cough or to loosen phlegm.     Marland Kitchen ipratropium-albuterol (DUONEB) 0.5-2.5 (3) MG/3ML SOLN USE 1 VIAL (3ML) BY NEBULIZER EVERY 4  TO 6 HOURS AS NEEDED 270 mL 0  . losartan (COZAAR) 25 MG tablet Take 25 mg by mouth daily as needed (elevated blood pressure.).     Marland Kitchen Melatonin 10 MG TABS Take 10 mg by mouth at bedtime as needed (sleep.).     Marland Kitchen metolazone (ZAROXOLYN) 2.5 MG tablet TAKE 1 TABLET BY MOUTH EVERY MONDAY 10 tablet 0  . metolazone (ZAROXOLYN) 2.5 MG tablet TAKE 1 TABLET (2.5 MG TOTAL) BY MOUTH EVERY MONDAY. 10 tablet 0  . montelukast (SINGULAIR) 10 MG tablet TAKE 1 TABLET BY MOUTH EVERY NIGHT AT BEDTIME 60 tablet 2  . potassium chloride SA (KLOR-CON) 20 MEQ tablet TAKE 40 MEQ (2 TABLETS) BY MOUTH EVERY MORNING, AND 20 MEQ (1 TABLET) EVERY EVENING. TAKE 40MEQ (2 TABLETS) WHEN YOU TAKE METOLAZONE 100 tablet 3  . Spacer/Aero-Holding Chambers (AEROCHAMBER MV) inhaler Use as instructed with Symbicort 1 each 0  . spironolactone (  ALDACTONE) 25 MG tablet TAKE 1 TABLET (25 MG TOTAL) BY MOUTH AT BEDTIME. 30 tablet 6  . torsemide (DEMADEX) 20 MG tablet TAKE 3 TABLETS BY MOUTH TWO TIMES DAILY 180 tablet 3   No current facility-administered medications for this visit.    Allergies:   Prednisone   Social History:  The patient  reports that she quit smoking about 21 months ago. Her smoking use included cigarettes. She has a 15.00 pack-year smoking history. She has never used smokeless tobacco. She reports current alcohol use of about 2.0 standard drinks of alcohol per week. She reports that she does not use drugs.   Family History:  The patient's family history includes Cancer (age of onset: 76) in her sister; Cardiomyopathy in her sister; Dementia in her mother; Diabetes in her brother and another family member; Heart disease in her sister.   ROS:  Please see the history of present illness.   Otherwise, review of systems is positive for none.   All other systems are reviewed and negative.   PHYSICAL EXAM: VS:  BP 100/60   Pulse 68   Ht 5\' 2"  (1.575 m)   Wt 125 lb (56.7 kg)   LMP  (LMP Unknown)   SpO2 96%   BMI 22.86 kg/m   , BMI Body mass index is 22.86 kg/m. GEN: Well nourished, well developed, in no acute distress  HEENT: normal  Neck: no JVD, carotid bruits, or masses Cardiac: RRR; no murmurs, rubs, or gallops,no edema  Respiratory:  clear to auscultation bilaterally, normal work of breathing GI: soft, nontender, nondistended, + BS MS: no deformity or atrophy  Skin: warm and dry, device site well healed Neuro:  Strength and sensation are intact Psych: euthymic mood, full affect  EKG:  EKG is ordered today. Personal review of the ekg ordered shows atrial sensed, ventricular paced  Personal review of the device interrogation today. Results in [place]: 07/24/2019: ALT 25; TSH 2.40 02/10/2020: B Natriuretic Peptide 980.8; BUN 37; Creatinine, Ser 1.14; Hemoglobin 14.4; Platelets 279; Potassium 3.7; Sodium 138    Lipid Panel     Component Value Date/Time   CHOL 211 (H) 07/24/2019 1511   TRIG 125 07/24/2019 1511   HDL 66 07/24/2019 1511   CHOLHDL 3.2 07/24/2019 1511   VLDL 8 06/21/2017 0143   LDLCALC 121 (H) 07/24/2019 1511   LDLDIRECT 149.0 06/02/2015 1351     Wt Readings from Last 3 Encounters:  04/25/20 125 lb (56.7 kg)  02/19/20 125 lb 12.8 oz (57.1 kg)  02/10/20 126 lb (57.2 kg)    TTE 12/25/2019 1. Left ventricular ejection fraction, by estimation, is 25 to 30%. The  left ventricle has severely decreased function. The left ventricle  demonstrates global hypokinesis. The left ventricular internal cavity size  was mildly dilated. Left ventricular  diastolic parameters are consistent with Grade III diastolic dysfunction  (restrictive). Elevated left atrial pressure.  2. Right ventricular systolic function is mildly reduced. The right  ventricular size is normal. There is severely elevated pulmonary artery  systolic pressure.  3. Left atrial size was severely dilated.  4. The mitral valve is degenerative. Moderate mitral valve regurgitation.  No evidence of mitral  stenosis.  5. The aortic valve is normal in structure. Aortic valve regurgitation is  mild. Mild aortic valve sclerosis is present, with no evidence of aortic  valve stenosis.  6. The inferior vena cava is dilated in size with <50% respiratory  variability, suggesting right atrial pressure of  15 mmHg.   Assessment and plan:  1.  Chronic systolic heart failure: Currently on optimal medical therapy.  Ejection fraction 20 to 25%.  Currently on optimal medical therapy for heart failure cardiology.  2.  Hypertrophic cardiomyopathy: Stable on most recent echo.  Plan per primary cardiology.  3.  Hypertension: Currently well controlled  4.  Persistent atrial fibrillation/flutter: Status post ablation x2, most recently 12/26/2018.  CHA2DS2-VASc of 4.  Currently on Eliquis.  Low burden on device interrogation.  Continue with current management.  Current medicines are reviewed at length with the patient today.   The patient does not have concerns regarding her medicines.  The following changes were made today: None  Labs/ tests ordered today include:  Orders Placed This Encounter  Procedures  . EKG 12-Lead    Disposition:   FU with Nitin Mckowen 6 month  Signed, Nellene Courtois Meredith Leeds, MD  04/25/2020 2:46 PM     [facility] [address] [place] [address] [phone] (office) [phone] (fax)

## 2020-04-25 NOTE — Patient Instructions (Signed)
Medication Instructions:  Your physician recommends that you continue on your current medications as directed. Please refer to the Current Medication list given to you today.  *If you need a refill on your cardiac medications before your next appointment, please call your pharmacy*   Lab Work: None ordered   Testing/Procedures: None ordered   Follow-Up: At [facility], you and your health needs are our priority.  As part of our continuing mission to provide you with exceptional heart care, we have created designated Provider Care Teams.  These Care Teams include your primary Cardiologist (physician) and Advanced Practice Providers (APPs -  Physician Assistants and Nurse Practitioners) who all work together to provide you with the care you need, when you need it.  Remote monitoring is used to monitor your Pacemaker or ICD from home. This monitoring reduces the number of office visits required to check your device to one time per year. It allows [place] to keep an eye on the functioning of your device to ensure it is working properly. You are scheduled for a device check from home on 07/14/20. You may send your transmission at any time that day. If you have a wireless device, the transmission will be sent automatically. After your physician reviews your transmission, you will receive a postcard with your next transmission date.  Your next appointment:   6 month(s)  The format for your next appointment:   In Person  Provider:   Allegra Lai, MD   Thank you for choosing [facility]!!   Trinidad Curet, RN [phone]

## 2020-04-28 NOTE — Progress Notes (Signed)
Remote pacemaker transmission.   

## 2020-04-29 ENCOUNTER — Other Ambulatory Visit (HOSPITAL_BASED_OUTPATIENT_CLINIC_OR_DEPARTMENT_OTHER): Payer: Self-pay

## 2020-04-29 MED FILL — Dapagliflozin Propanediol Tab 10 MG (Base Equivalent): ORAL | 30 days supply | Qty: 30 | Fill #0 | Status: AC

## 2020-04-29 MED FILL — Digoxin Tab 125 MCG (0.125 MG): ORAL | 30 days supply | Qty: 30 | Fill #0 | Status: AC

## 2020-05-03 ENCOUNTER — Other Ambulatory Visit (HOSPITAL_BASED_OUTPATIENT_CLINIC_OR_DEPARTMENT_OTHER): Payer: Self-pay

## 2020-05-03 MED FILL — Torsemide Tab 20 MG: ORAL | 30 days supply | Qty: 180 | Fill #0 | Status: AC

## 2020-05-10 ENCOUNTER — Ambulatory Visit (INDEPENDENT_AMBULATORY_CARE_PROVIDER_SITE_OTHER): Payer: 59

## 2020-05-10 ENCOUNTER — Other Ambulatory Visit: Payer: Self-pay

## 2020-05-10 ENCOUNTER — Encounter: Payer: Self-pay | Admitting: Primary Care

## 2020-05-10 ENCOUNTER — Telehealth: Payer: Self-pay | Admitting: Adult Health

## 2020-05-10 ENCOUNTER — Other Ambulatory Visit (HOSPITAL_BASED_OUTPATIENT_CLINIC_OR_DEPARTMENT_OTHER): Payer: Self-pay

## 2020-05-10 ENCOUNTER — Telehealth (HOSPITAL_COMMUNITY): Payer: Self-pay | Admitting: Internal Medicine

## 2020-05-10 ENCOUNTER — Ambulatory Visit (INDEPENDENT_AMBULATORY_CARE_PROVIDER_SITE_OTHER): Payer: 59 | Admitting: Primary Care

## 2020-05-10 VITALS — BP 122/68 | HR 100 | Temp 98.4°F | Ht 62.0 in | Wt 123.0 lb

## 2020-05-10 DIAGNOSIS — J441 Chronic obstructive pulmonary disease with (acute) exacerbation: Secondary | ICD-10-CM

## 2020-05-10 DIAGNOSIS — I5032 Chronic diastolic (congestive) heart failure: Secondary | ICD-10-CM

## 2020-05-10 DIAGNOSIS — J449 Chronic obstructive pulmonary disease, unspecified: Secondary | ICD-10-CM

## 2020-05-10 DIAGNOSIS — G4733 Obstructive sleep apnea (adult) (pediatric): Secondary | ICD-10-CM | POA: Diagnosis not present

## 2020-05-10 DIAGNOSIS — R0602 Shortness of breath: Secondary | ICD-10-CM

## 2020-05-10 DIAGNOSIS — J439 Emphysema, unspecified: Secondary | ICD-10-CM | POA: Diagnosis not present

## 2020-05-10 DIAGNOSIS — J811 Chronic pulmonary edema: Secondary | ICD-10-CM | POA: Diagnosis not present

## 2020-05-10 MED ORDER — PREDNISONE 10 MG PO TABS
ORAL_TABLET | ORAL | 0 refills | Status: DC
  Filled 2020-05-10: qty 10, 5d supply, fill #0

## 2020-05-10 MED ORDER — DOXYCYCLINE HYCLATE 100 MG PO TABS
100.0000 mg | ORAL_TABLET | Freq: Two times a day (BID) | ORAL | 0 refills | Status: DC
Start: 2020-05-10 — End: 2020-05-18
  Filled 2020-05-10: qty 20, 10d supply, fill #0

## 2020-05-10 MED ORDER — METHYLPREDNISOLONE ACETATE 80 MG/ML IJ SUSP
80.0000 mg | Freq: Once | INTRAMUSCULAR | Status: AC
  Administered 2020-05-10: 80 mg via INTRAMUSCULAR

## 2020-05-10 NOTE — Assessment & Plan Note (Addendum)
-   Does not appear overtly fluid overloaded on exam but CXR showed vascular congestion - Continue Torsemide 60mg  morning and afternoon, spironolactone 25mg  at bedtime, zaroxolyn 2.5mg  every Monday  - Advised patient to follow 1.5-1.75 L fluid restriction and low sodium diet. She needs to weightherself every morning and notify cardiology if > 3lb weight gain in 24 hours or >5 lbs in 1 week  * We will wait on labs to come back but likely will need to adjust diuretic some or take additional Zaroxolyn dose

## 2020-05-10 NOTE — Telephone Encounter (Signed)
Called pt back to get more information. No answer but pt is scheduled with pulm today at 3pm c/o shortness of breath and wheezing.

## 2020-05-10 NOTE — Telephone Encounter (Signed)
Called and spoke to pt. Pt c/o wheezing and dyspnea x 3 days. Pt states her dyspnea has progressively gotten worse. Pt denies chest congestion, chest tightness, cough. Pt states her weight seems to be ok and is taking her fluid pills as directed. Pt hasnt noticed any increase in swelling. Pt states she has taken her duoneb twice today with very little to no relief along with her albuterol hfa with no relief. Pts spo2 was 90% on room air while we were  Speaking on the phone, occasionally it would go to 88 or 89% but would return to 90%, and stayed there for the majority time during the call. Call lasted 13 minutes. Instructed pt she may need to seek emergency care. Pt states she is going to call cardiology as this may be related to her heart failure. Pt aware to call 911 if her spo2 worsens or any worsening of her s/s.

## 2020-05-10 NOTE — Telephone Encounter (Signed)
Called and spoke to pt to check on her. Pt is requesting an appt with our office. Appt scheduled with Derl Barrow, NP, for today at 3pm.   Will forward to [facility] as FYI.

## 2020-05-10 NOTE — Progress Notes (Signed)
@Patient  ID: Angela Shepherd, female    DOB: 1950/03/23, 71 y.o.   MRN: [id number]  Chief Complaint  Patient presents with  . Follow-up    Wheezing and shortness of breath for 1 month.     Referring provider: Midge Minium, MD  HPI: 70 year old female, former smoker quit in 07/12/18. PMH significant for COPD with emphysema and asthma, OSA, pulmonary edema, chronic allergic rhinitis, hypertension, A. fib, cardiomyopathy, chronic combined systolic and diastolic heart failure, stage II chronic kidney disease.  Patient of Dr. Halford Chessman. Maintained on BIPAP 12/8cm h20.   Previous [facility] pulmonary encounter: 11/17/2018 Breathing has been okay.  Not having cough, wheeze, sputum, or chest pain.  Sleeping better.  Uses Bipap nightly.  No issues with mask fit or pressure.  Denies sinus congestion, sore throat, dry mouth, or aerophagia.  CT chest from 11/13/18 reviewed by me shows new lung nodule in RUL.  Remainder of findings stable.  02/19/2020 Patient presents today for regular follow-up. She is doing well, no acute complaints. She needs refill Symbicort, albuterol hfa and Singulair. She also needs order to renew BIPAP supplies.   She was hospitalized in [facility] May 2021 for acute respiratory failure. Found to have right sided pneumonia and pulmonary edema. Echo with EF 20-25%. She follows closely with Dr. Haroldine Laws for combined systolic and diastolic heart failure. Her diuretics have been increased. She is taking Torsemide 60mg  twice daily and Zaroxolyn once a week on Monday. She states that this has tremendously helped her breathing. She has repeat 2D echo scheduled for June and follow-up. She is wearing BIPAP every night without issues. She has oxygen at home that she uses prn.   Repeat CT chest in May showed right upper lobe pulmonary nodule has since resolved  Airview download 01/20/20-02/18/20 30/30 days used; 100% > 4 hours Average usage 11 hours 0 mins Pressure IPAP 12cm h20;  EPAP 8cm h20  Airleaks 12.2L/min (95%) AHI 2.8  05/10/2020- Interim hx  Patient presents today for acute visit. She reports wheezing and dyspnea started around Easter. Worse last 3 days. She has a non-productive congested cough. She think pollen has exacerbated her symptoms. She is taking diuretic as prescribed, no noticeable weight gain or leg swelling. Her weight range is between 120-125lbs. SpO2 90% on RA. She used duoneb today with slight temporary improvement. She is complaint with Symbicort 160, Singulair 10mg , Torsemide 60mg  BID, spironolactone 25mg , zaroxolyn 2.5mg  every Monday and Eliquis. She is wearing BIPAP everynight for average 11 hours 36 mins. Pressure IPAP 12/ EPAP 8. Tells me that she can not take prednisone d/t HF. She feels her past CHF exacerbation was d.t high salt diet. She last saw cardiology on 04/25/20.    Airview download 04/10/20-05/09/20 30/30 days used; 100% > 4 hours Average usage 11 hours 36 mins IPAP 12/EPAP 8 Airleaks 24.2L/min AHI 3.4  Pulmonary testing:  PFT 07/29/17 >> FEV1 0.84 (41%), FEV1% 59, DLCO 41%  Chest imaging: Cardiac CT 11/11/17 >> emphysema, Rt pleural thickening with calcification, new 4 mm nodule in RLL, new 3 mm nodule LUL. CT chest 02/07/18 >> pleural thickening, apical micronodularity CT chest 11/13/18 >> mild centrilobular emphysema, pleural thickening, 6 mm nodule RUL, stable scattered micro nodules in b/l upper lobes  Allergies  Allergen Reactions  . Prednisone Shortness Of Breath    CHF     Immunization History  Administered Date(s) Administered  . Fluad Quad(high Dose 65+) 10/09/2019  . Hepatitis B, adult 06/14/2015, 07/15/2015  .  Influenza Split 10/21/2017  . Influenza, High Dose Seasonal PF 10/23/2018  . Influenza,inj,Quad PF,6+ Mos 10/22/2012, 10/15/2013, 10/18/2014, 10/18/2015, 09/07/2016  . PFIZER(Purple Top)SARS-COV-2 Vaccination 01/22/2019, 02/15/2019, 12/08/2019  . Pneumococcal Conjugate-13 02/08/2006  . Pneumococcal  Polysaccharide-23 10/22/2012, 01/18/2017  . Tdap 05/04/2011  . Zoster 10/22/2012    Past Medical History:  Diagnosis Date  . Bradycardia    a. initial PPM placed 1994 at [facility] with gen change 2002. b. 1*AVB & intermittent 2nd degree AVB + CHF --> upgrade to [facility]. Jude BiV PPM 05/08/12.  . Chronic systolic CHF (congestive heart failure) ([place])    a. EF 30-35% dx in 2011 -> most recent 40-45% by echo 04/2012. b. s/p upgrade to BiV-PPM 05/08/12. c. Med titration limited by hypotension.  . Cluster headache syndrome   . Hypertr obst cardiomyop   . Hypokalemia   . Hypotension   . Paroxysmal atrial fibrillation (HCC)    a. identified on PPM interrogation b. longest episode 1 hour; if burden increases, will need [place]   . Pneumonia 2008  . Sleep apnea     Tobacco History: Social History   Tobacco Use  Smoking Status Former Smoker  . Packs/day: 0.50  . Years: 30.00  . Pack years: 15.00  . Types: Cigarettes  . Quit date: 07/12/2018  . Years since quitting: 1.8  Smokeless Tobacco Never Used   Counseling given: Not Answered   Outpatient Medications Prior to Visit  Medication Sig Dispense Refill  . acetaminophen (TYLENOL) 500 MG tablet Take 500-1,000 mg by mouth every 6 (six) hours as needed (for pain).    Marland Kitchen albuterol (VENTOLIN HFA) 108 (90 Base) MCG/ACT inhaler INHALE 1 - 2 PUFFS INTO THE LUNGS EVERY 6 HOURS AS NEEDED FOR WHEEZING OR SHORTNESS OF BREATH 18 g 6  . allopurinol (ZYLOPRIM) 100 MG tablet Take 50 mg by mouth daily as needed (gout flares.).     Marland Kitchen ALPRAZolam (XANAX) 0.25 MG tablet TAKE 1 TABLET BY MOUTH TWICE DAILY AS NEEDED FOR ANXIETY (Patient taking differently: Take by mouth 2 (two) times daily as needed. for anxiety) 30 tablet 0  . AMBULATORY NON FORMULARY MEDICATION Medication Name: 100% O2 15 leters a min for 15-20 mins  Via Nasal cannual 1 Bottle 2  . amitriptyline (ELAVIL) 10 MG tablet Take 10 mg by mouth daily as needed (migraine).     Marland Kitchen apixaban (ELIQUIS) 5 MG TABS tablet  TAKE 1 TABLET BY MOUTH TWICE DAILY 60 tablet 2  . budesonide-formoterol (SYMBICORT) 160-4.5 MCG/ACT inhaler INHALE 2 PUFFS INTO THE LUNGS 2 TIMES DAILY 10.2 g 11  . colchicine 0.6 MG tablet Take 1 tablet (0.6 mg total) by mouth 2 (two) times daily as needed (for gout flare ups.). 60 tablet 1  . COVID-19 mRNA vaccine, Pfizer, 30 MCG/0.3ML injection INJECT AS DIRECTED .3 mL 0  . dapagliflozin propanediol (FARXIGA) 10 MG TABS tablet TAKE 1 TABLET (10 MG TOTAL) BY MOUTH DAILY BEFORE BREAKFAST. 30 tablet 6  . digoxin (LANOXIN) 0.125 MG tablet TAKE 1 TABLET (0.125 MG TOTAL) BY MOUTH DAILY. 30 tablet 6  . diphenhydrAMINE (SOMINEX) 25 MG tablet Take 50 mg by mouth at bedtime.    Marland Kitchen FLUoxetine (PROZAC) 20 MG capsule Take 20 mg by mouth daily.    Marland Kitchen guaiFENesin (MUCINEX) 600 MG 12 hr tablet Take 600 mg by mouth as needed for cough or to loosen phlegm.     Marland Kitchen ipratropium-albuterol (DUONEB) 0.5-2.5 (3) MG/3ML SOLN USE 1 VIAL (3ML) BY NEBULIZER EVERY 4 TO 6 HOURS  AS NEEDED 270 mL 0  . losartan (COZAAR) 25 MG tablet Take 25 mg by mouth daily as needed (elevated blood pressure.).     Marland Kitchen Melatonin 10 MG TABS Take 10 mg by mouth at bedtime as needed (sleep.).     Marland Kitchen metolazone (ZAROXOLYN) 2.5 MG tablet TAKE 1 TABLET BY MOUTH EVERY MONDAY 10 tablet 0  . metolazone (ZAROXOLYN) 2.5 MG tablet TAKE 1 TABLET (2.5 MG TOTAL) BY MOUTH EVERY MONDAY. 10 tablet 0  . montelukast (SINGULAIR) 10 MG tablet TAKE 1 TABLET BY MOUTH EVERY NIGHT AT BEDTIME 60 tablet 2  . potassium chloride SA (KLOR-CON) 20 MEQ tablet TAKE 40 MEQ (2 TABLETS) BY MOUTH EVERY MORNING, AND 20 MEQ (1 TABLET) EVERY EVENING. TAKE 40MEQ (2 TABLETS) WHEN YOU TAKE METOLAZONE 100 tablet 3  . Spacer/Aero-Holding Chambers (AEROCHAMBER MV) inhaler Use as instructed with Symbicort 1 each 0  . spironolactone (ALDACTONE) 25 MG tablet TAKE 1 TABLET (25 MG TOTAL) BY MOUTH AT BEDTIME. 30 tablet 6  . torsemide (DEMADEX) 20 MG tablet TAKE 3 TABLETS BY MOUTH TWO TIMES DAILY 180  tablet 3  . amoxicillin (AMOXIL) 500 MG capsule TAKE 4 CAPSULES BY MOUTH 1 HOUR PRIOR TO DENTAL APPOINTMENT (Patient not taking: Reported on 05/10/2020) 16 capsule 2   No facility-administered medications prior to visit.   Review of Systems  Review of Systems  Constitutional: Negative.   HENT: Positive for congestion and sneezing.   Respiratory: Positive for cough, shortness of breath and wheezing.   Cardiovascular: Negative for leg swelling.   Physical Exam  BP 122/68 (BP Location: Right Arm, Cuff Size: Normal)   Pulse 100   Temp 98.4 F (36.9 C) (Temporal)   Ht 5\' 2"  (1.575 m)   Wt 123 lb (55.8 kg)   LMP  (LMP Unknown)   SpO2 92% Comment: RA  BMI 22.50 kg/m  Physical Exam Constitutional:      Appearance: Normal appearance.  HENT:     Head: Normocephalic and atraumatic.  Cardiovascular:     Rate and Rhythm: Normal rate and regular rhythm.  Pulmonary:     Breath sounds: Wheezing and rales present. No rhonchi.     Comments: Somewhat labored; O2 90-92% RA Skin:    General: Skin is warm and dry.  Neurological:     General: No focal deficit present.     Mental Status: She is alert and oriented to person, place, and time. Mental status is at baseline.  Psychiatric:        Mood and Affect: Mood normal.        Behavior: Behavior normal.        Thought Content: Thought content normal.        Judgment: Judgment normal.      Lab Results:  CBC    Component Value Date/Time   WBC 8.2 02/10/2020 1605   RBC 4.53 02/10/2020 1605   HGB 14.4 02/10/2020 1605   HGB 13.6 02/13/2018 1243   HCT 43.7 02/10/2020 1605   HCT 40.0 02/13/2018 1243   PLT 279 02/10/2020 1605   PLT 238 02/13/2018 1243   MCV 96.5 02/10/2020 1605   MCV 96 02/13/2018 1243   MCH 31.8 02/10/2020 1605   MCHC 33.0 02/10/2020 1605   RDW 14.6 02/10/2020 1605   RDW 13.2 02/13/2018 1243   LYMPHSABS 1,482 07/24/2019 1511   LYMPHSABS 1.3 02/13/2018 1243   MONOABS 0.4 10/01/2018 0856   EOSABS 110 07/24/2019  1511   EOSABS 0.1 02/13/2018 1243  BASOSABS 73 07/24/2019 1511   BASOSABS 0.1 02/13/2018 1243    BMET    Component Value Date/Time   NA 138 02/10/2020 1605   NA 143 12/25/2019 0958   K 3.7 02/10/2020 1605   CL 97 (L) 02/10/2020 1605   CO2 28 02/10/2020 1605   GLUCOSE 93 02/10/2020 1605   BUN 37 (H) 02/10/2020 1605   BUN 26 12/25/2019 0958   CREATININE 1.14 (H) 02/10/2020 1605   CREATININE 1.45 (H) 07/24/2019 1511   CALCIUM 9.0 02/10/2020 1605   GFRNONAA 52 (L) 02/10/2020 1605   GFRAA 51 (L) 12/25/2019 0958    BNP    Component Value Date/Time   BNP 980.8 (H) 02/10/2020 1605   BNP 161.0 (H) 01/20/2015 1647    ProBNP    Component Value Date/Time   PROBNP 1,698.0 (H) 08/05/2017 1307    Imaging: DG Chest 2 View  Result Date: 05/10/2020 CLINICAL DATA:  Shortness of breath, decreased oxygen, COPD exacerbation EXAM: CHEST - 2 VIEW COMPARISON:  08/05/2017 FINDINGS: LEFT subclavian pacemaker with leads projecting at RIGHT atrium, RIGHT ventricle, and coronary sinus. Enlargement of cardiac silhouette with pulmonary vascular congestion. Atherosclerotic calcification aorta. Changes of COPD with extensive scarring in the mid to lower RIGHT lung. Minimal linear scarring in lingula. No acute infiltrate, pleural effusion or pneumothorax. Osseous structures unremarkable. IMPRESSION: COPD changes with RIGHT lung scarring. Enlargement of cardiac silhouette with pulmonary vascular congestion post pacemaker. No acute abnormalities. Aortic Atherosclerosis (ICD10-I70.0) and Emphysema (ICD10-J43.9). Electronically Signed   By: Lavonia Dana M.D.   On: 05/10/2020 15:41   US RENAL  Result Date: 04/17/2020 CLINICAL DATA:  Stage 3 chronic kidney disease EXAM: RENAL / URINARY TRACT ULTRASOUND COMPLETE COMPARISON:  CT 01/15/2017 FINDINGS: Right Kidney: Renal measurements: 9.7 x 3.3 x 4.5 cm = volume: 74.8 mL. Echogenicity within normal limits. No mass or hydronephrosis visualized. Left Kidney: Renal  measurements: 10.4 x 4.6 x 5.4 cm = volume: 137.2 mL. Echogenicity within normal limits. No hydronephrosis. Cyst off the upper pole measuring 1.8 cm. Bladder: Appears normal for degree of bladder distention. Other: None. IMPRESSION: 1. Negative for hydronephrosis. 2. Simple appearing left renal cyst Electronically Signed   By: Donavan Foil M.D.   On: 04/17/2020 22:34   CUP PACEART REMOTE DEVICE CHECK  Result Date: 04/15/2020 Scheduled remote reviewed. 97 AMS events longest duration 2:40 hrs appears a fib.  A burden <1%.  Known PAF.  Meds: Eliquis, Digoxin.  Normal device function.  R. Powers, CVRS Next remote 91 days.    Assessment & Plan:   COPD (chronic obstructive pulmonary disease) (HCC) - Increased dyspnea with associated wheezing and congested cough, Treating for suspected AECOPD.CXR showed COPD changes, pulmonary vascular congstion. NO pneumonia  - Patient received depo-medrol injection 80mg  IM today - Continue Symbicort 160 two puffs morning and evening and Singulair 10mg  and  - Take Mucinex 600-1200mg  twice daily with water  - Start Duoneb 4 times a day scheduled (8am, 12pm, 4pm, 8pm) - FU in 5-10 days with APP    Chronic diastolic heart failure - Does not appear overtly fluid overloaded on exam but CXR showed vascular congestion - Continue Torsemide 60mg  morning and afternoon, spironolactone 25mg  at bedtime, zaroxolyn 2.5mg  every Monday  - Advised patient to follow 1.5-1.75 L fluid restriction and low sodium diet. She needs to weightherself every morning and notify cardiology if > 3lb weight gain in 24 hours or >5 lbs in 1 week  * We will wait on labs to come back  but likely will need to adjust diuretic some or take additional Zaroxolyn dose    OSA (obstructive sleep apnea) - Patient is 100% compliant with BIPAP - Pressure 12/8 cm h20; residual AHI 3.4 - No changes    Martyn Ehrich, NP 05/10/2020

## 2020-05-10 NOTE — Assessment & Plan Note (Signed)
-   Patient is 100% compliant with BIPAP - Pressure 12/8 cm h20; residual AHI 3.4 - No changes

## 2020-05-10 NOTE — Assessment & Plan Note (Addendum)
-   Increased dyspnea with associated wheezing and congested cough, Treating for suspected AECOPD.CXR showed COPD changes, pulmonary vascular congstion. NO pneumonia  - Patient received depo-medrol injection 80mg  IM today - Continue Symbicort 160 two puffs morning and evening and Singulair 10mg  and  - Take Mucinex 600-1200mg  twice daily with water  - Start Duoneb 4 times a day scheduled (8am, 12pm, 4pm, 8pm) - FU in 5-10 days with APP

## 2020-05-10 NOTE — Patient Instructions (Addendum)
CXR showed COPD changes, pulmonary vascular congstion. NO pneumonia   COPD: - Continue Symbicort 160 two puffs morning and evening, Singulair 10mg  and  - Take Mucinex 600-1200mg  twice daily with water  - Start Duoneb 4 times a day scheduled (8am, 12pm, 4pm, 8pm)  Heart failure - Continue Torsemide 60mg  morning and afternoon, spironolactone 25mg  at bedtime, zaroxolyn 2.5mg  every Monday  - Follow 1.5-1.75 L fluid restriction and low sodium diet - Weight yourself every morning, notify cardiology if > 3lb weight gain in 24 hours or >5 lbs in 1 week  * We will wait on labs to come back but likely will need yo adjust diuretic some or take additional Zaroxolyn dose   Office treatment: - Depo-medrol 80mg  IM x1  Orders: - CXR today (done) - Labs ordered  Rx: - Doxycycline 1 tab twice daily x 10 days - Prednisone 20mg  x 5 days   Follow-up: - 5-10 days with Eustaquio Maize NP

## 2020-05-10 NOTE — Telephone Encounter (Signed)
Pt would like a nurse to call back, would like earlier appt than June, having difficult breathing, please call [phone]. Thanks

## 2020-05-11 ENCOUNTER — Other Ambulatory Visit: Payer: Self-pay | Admitting: Family Medicine

## 2020-05-11 ENCOUNTER — Other Ambulatory Visit (HOSPITAL_BASED_OUTPATIENT_CLINIC_OR_DEPARTMENT_OTHER): Payer: Self-pay

## 2020-05-11 LAB — CBC WITH DIFFERENTIAL/PLATELET
Basophils Absolute: 0.1 10*3/uL (ref 0.0–0.1)
Basophils Relative: 0.8 % (ref 0.0–3.0)
Eosinophils Absolute: 0.1 10*3/uL (ref 0.0–0.7)
Eosinophils Relative: 0.6 % (ref 0.0–5.0)
HCT: 39.7 % (ref 36.0–46.0)
Hemoglobin: 13.2 g/dL (ref 12.0–15.0)
Lymphocytes Relative: 5.9 % — ABNORMAL LOW (ref 12.0–46.0)
Lymphs Abs: 0.6 10*3/uL — ABNORMAL LOW (ref 0.7–4.0)
MCHC: 33.3 g/dL (ref 30.0–36.0)
MCV: 93.5 fl (ref 78.0–100.0)
Monocytes Absolute: 0.7 10*3/uL (ref 0.1–1.0)
Monocytes Relative: 7.2 % (ref 3.0–12.0)
Neutro Abs: 8.6 10*3/uL — ABNORMAL HIGH (ref 1.4–7.7)
Neutrophils Relative %: 85.5 % — ABNORMAL HIGH (ref 43.0–77.0)
Platelets: 241 10*3/uL (ref 150.0–400.0)
RBC: 4.25 Mil/uL (ref 3.87–5.11)
RDW: 16.9 % — ABNORMAL HIGH (ref 11.5–15.5)
WBC: 10.1 10*3/uL (ref 4.0–10.5)

## 2020-05-11 LAB — BASIC METABOLIC PANEL
BUN: 20 mg/dL (ref 6–23)
CO2: 30 mEq/L (ref 19–32)
Calcium: 8.9 mg/dL (ref 8.4–10.5)
Chloride: 97 mEq/L (ref 96–112)
Creatinine, Ser: 1.19 mg/dL (ref 0.40–1.20)
GFR: 46.97 mL/min — ABNORMAL LOW (ref >60.00)
Glucose, Bld: 101 mg/dL — ABNORMAL HIGH (ref 70–99)
Potassium: 3 mEq/L — ABNORMAL LOW (ref 3.5–5.1)
Sodium: 137 mEq/L (ref 135–145)

## 2020-05-11 LAB — BRAIN NATRIURETIC PEPTIDE: Pro B Natriuretic peptide (BNP): 1971 pg/mL — ABNORMAL HIGH (ref 0.0–100.0)

## 2020-05-11 MED ORDER — ALPRAZOLAM 0.25 MG PO TABS
ORAL_TABLET | Freq: Two times a day (BID) | ORAL | 0 refills | Status: DC | PRN
  Filled 2020-05-11: qty 30, 15d supply, fill #0

## 2020-05-11 MED FILL — Montelukast Sodium Tab 10 MG (Base Equiv): ORAL | 30 days supply | Qty: 60 | Fill #0 | Status: AC

## 2020-05-11 MED FILL — Albuterol Sulfate Inhal Aero 108 MCG/ACT (90MCG Base Equiv): RESPIRATORY_TRACT | 30 days supply | Qty: 18 | Fill #0 | Status: AC

## 2020-05-11 MED FILL — Budesonide-Formoterol Fumarate Dihyd Aerosol 160-4.5 MCG/ACT: RESPIRATORY_TRACT | 30 days supply | Qty: 10.2 | Fill #0 | Status: AC

## 2020-05-11 NOTE — Telephone Encounter (Signed)
LFD 04/01/20 #30 with no refills LOV 07/24/19 NOV 08/12/20

## 2020-05-11 NOTE — Progress Notes (Signed)
Her BNP was significantly elevated and potassium was low. Patient needs to take additional Zaroxolyn 2.5mg  x1 today. Is she taking potassium supplement 41meq in am and 66meq in pm. She needs to take additional 76meq when she takes zaroxolyn.  She needs to contact cardiology

## 2020-05-12 ENCOUNTER — Other Ambulatory Visit (HOSPITAL_BASED_OUTPATIENT_CLINIC_OR_DEPARTMENT_OTHER): Payer: Self-pay

## 2020-05-18 ENCOUNTER — Ambulatory Visit (INDEPENDENT_AMBULATORY_CARE_PROVIDER_SITE_OTHER): Payer: 59 | Admitting: Primary Care

## 2020-05-18 ENCOUNTER — Other Ambulatory Visit (HOSPITAL_BASED_OUTPATIENT_CLINIC_OR_DEPARTMENT_OTHER): Payer: Self-pay

## 2020-05-18 ENCOUNTER — Encounter: Payer: Self-pay | Admitting: Primary Care

## 2020-05-18 ENCOUNTER — Other Ambulatory Visit: Payer: Self-pay

## 2020-05-18 VITALS — BP 108/64 | HR 60 | Temp 97.7°F | Ht 62.0 in | Wt 117.0 lb

## 2020-05-18 DIAGNOSIS — J449 Chronic obstructive pulmonary disease, unspecified: Secondary | ICD-10-CM | POA: Diagnosis not present

## 2020-05-18 DIAGNOSIS — B370 Candidal stomatitis: Secondary | ICD-10-CM

## 2020-05-18 DIAGNOSIS — I5042 Chronic combined systolic (congestive) and diastolic (congestive) heart failure: Secondary | ICD-10-CM

## 2020-05-18 DIAGNOSIS — J309 Allergic rhinitis, unspecified: Secondary | ICD-10-CM | POA: Diagnosis not present

## 2020-05-18 DIAGNOSIS — R0602 Shortness of breath: Secondary | ICD-10-CM

## 2020-05-18 MED ORDER — FLUTICASONE PROPIONATE 50 MCG/ACT NA SUSP
1.0000 | Freq: Every day | NASAL | 2 refills | Status: AC
  Filled 2020-05-18: qty 16, 30d supply, fill #0

## 2020-05-18 MED ORDER — NYSTATIN 100000 UNIT/ML MT SUSP
5.0000 mL | Freq: Four times a day (QID) | OROMUCOSAL | 0 refills | Status: DC
  Filled 2020-05-18: qty 240, 12d supply, fill #0

## 2020-05-18 NOTE — Progress Notes (Signed)
@Patient  ID: Angela Shepherd, female    DOB: 15-Sep-1950, 71 y.o.   MRN: [id number]  Chief Complaint  Patient presents with  . Follow-up    SOB has improved. Has terrible taste in mouth, like its burnt. Making her nauseated. Some dry coughing    Referring provider: Midge Minium, MD  HPI:  70 year old female, former smoker quit in 07/12/18. PMH significant for COPD with emphysema and asthma, OSA, pulmonary edema, chronic allergic rhinitis, hypertension, A. fib, cardiomyopathy, chronic combined systolic and diastolic heart failure, stage II chronic kidney disease.  Patient of Dr. Halford Chessman. Maintained on BIPAP 12/8cm h20.   Previous [facility] pulmonary encounter: 11/17/2018 Breathing has been okay.  Not having cough, wheeze, sputum, or chest pain.  Sleeping better.  Uses Bipap nightly.  No issues with mask fit or pressure.  Denies sinus congestion, sore throat, dry mouth, or aerophagia.  CT chest from 11/13/18 reviewed by me shows new lung nodule in RUL.  Remainder of findings stable.  02/19/2020 Patient presents today for regular follow-up. She is doing well, no acute complaints. She needs refill Symbicort, albuterol hfa and Singulair. She also needs order to renew BIPAP supplies.   She was hospitalized in [facility] May 2021 for acute respiratory failure. Found to have right sided pneumonia and pulmonary edema. Echo with EF 20-25%. She follows closely with Dr. Haroldine Laws for combined systolic and diastolic heart failure. Her diuretics have been increased. She is taking Torsemide 60mg  twice daily and Zaroxolyn once a week on Monday. She states that this has tremendously helped her breathing. She has repeat 2D echo scheduled for June and follow-up. She is wearing BIPAP every night without issues. She has oxygen at home that she uses prn.   Repeat CT chest in May showed right upper lobe pulmonary nodule has since resolved  Airview download 01/20/20-02/18/20 30/30 days used; 100% > 4  hours Average usage 11 hours 0 mins Pressure IPAP 12cm h20; EPAP 8cm h20  Airleaks 12.2L/min (95%) AHI 2.8  05/10/2020 Patient presents today for acute visit. She reports wheezing and dyspnea started around Easter. Worse last 3 days. She has a non-productive congested cough. She think pollen has exacerbated her symptoms. She is taking diuretic as prescribed, no noticeable weight gain or leg swelling. Her weight range is between 120-125lbs. SpO2 90% on RA. She used duoneb today with slight temporary improvement. She is complaint with Symbicort 160, Singulair 10mg , Torsemide 60mg  BID, spironolactone 25mg , zaroxolyn 2.5mg  every Monday and Eliquis. She is wearing BIPAP everynight for average 11 hours 36 mins. Pressure IPAP 12/ EPAP 8. Tells me that she can not take prednisone d/t HF. She feels her past CHF exacerbation was d.t high salt diet. She last saw cardiology on 04/25/20.   Airview download 04/10/20-05/09/20 30/30 days used; 100% > 4 hours Average usage 11 hours 36 mins IPAP 12/EPAP 8 Airleaks 24.2L/min AHI 3.4  05/18/2020 - Interim hx  Patient presents today for 1 week follow-up dyspnea/HF exacerbation. During last visit it was noted that her BNP was significantly elevated at 1,971. Advised her to take additional Zaroxolyn 2.5mg  x1 as well as 2meq on days she takes Zaroxolyn. Recommended she contact cardiologist. She is maintained on Torsemide 60mg  BID, spironolactone 64m and zaroxolyn 2.5mg  every Monday. Reports that her shortness of breath is much better today. She has a dry cough, thrush symptoms, nausea and PND. Food tastes metallic. Not currently using nasal spray. No fever, she is not experiencing sweats any longer. She has  3 days left of doxycycline. She completed prednisone.     Pulmonary testing:  PFT 07/29/17 >> FEV1 0.84 (41%), FEV1% 59, DLCO 41%  Chest imaging: Cardiac CT 11/11/17 >> emphysema, Rt pleural thickening with calcification, new 4 mm nodule in RLL, new 3 mm nodule LUL. CT  chest 02/07/18 >> pleural thickening, apical micronodularity CT chest 11/13/18 >> mild centrilobular emphysema, pleural thickening, 6 mm nodule RUL, stable scattered micro nodules in b/l upper lobes  Allergies  Allergen Reactions  . Prednisone Shortness Of Breath    CHF     Immunization History  Administered Date(s) Administered  . Fluad Quad(high Dose 65+) 10/09/2019  . Hepatitis B, adult 06/14/2015, 07/15/2015  . Influenza Split 10/21/2017  . Influenza, High Dose Seasonal PF 10/23/2018  . Influenza,inj,Quad PF,6+ Mos 10/22/2012, 10/15/2013, 10/18/2014, 10/18/2015, 09/07/2016  . PFIZER(Purple Top)SARS-COV-2 Vaccination 01/22/2019, 02/15/2019, 12/08/2019  . Pneumococcal Conjugate-13 02/08/2006  . Pneumococcal Polysaccharide-23 10/22/2012, 01/18/2017  . Tdap 05/04/2011  . Zoster 10/22/2012    Past Medical History:  Diagnosis Date  . Bradycardia    a. initial PPM placed [facility] at [facility] with gen change 2002. b. 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 Jude BiV PPM 05/08/12.  . Chronic systolic CHF (congestive heart failure) ([place])    a. EF 30-35% dx in 2011 -> most recent 40-45% by echo 04/2012. b. s/p upgrade to BiV-PPM 05/08/12. c. Med titration limited by hypotension.  . Cluster headache syndrome   . Hypertr obst cardiomyop   . Hypokalemia   . Hypotension   . Paroxysmal atrial fibrillation (HCC)    a. identified on PPM interrogation b. longest episode 1 hour; if burden increases, will need [place]   . Pneumonia 2008  . Sleep apnea     Tobacco History: Social History   Tobacco Use  Smoking Status Former Smoker  . Packs/day: 0.50  . Years: 30.00  . Pack years: 15.00  . Types: Cigarettes  . Quit date: 07/12/2018  . Years since quitting: 1.8  Smokeless Tobacco Never Used   Counseling given: Not Answered   Outpatient Medications Prior to Visit  Medication Sig Dispense Refill  . acetaminophen (TYLENOL) 500 MG tablet Take 500-1,000 mg by mouth every 6 (six)  hours as needed (for pain).    Marland Kitchen albuterol (VENTOLIN HFA) 108 (90 Base) MCG/ACT inhaler INHALE 1 - 2 PUFFS INTO THE LUNGS EVERY 6 HOURS AS NEEDED FOR WHEEZING OR SHORTNESS OF BREATH 18 g 6  . allopurinol (ZYLOPRIM) 100 MG tablet Take 50 mg by mouth daily as needed (gout flares.).     Marland Kitchen ALPRAZolam (XANAX) 0.25 MG tablet TAKE 1 TABLET BY MOUTH TWICE DAILY AS NEEDED FOR ANXIETY 30 tablet 0  . AMBULATORY NON FORMULARY MEDICATION Medication Name: 100% O2 15 leters a min for 15-20 mins  Via Nasal cannual 1 Bottle 2  . amitriptyline (ELAVIL) 10 MG tablet Take 10 mg by mouth daily as needed (migraine).     Marland Kitchen amoxicillin (AMOXIL) 500 MG capsule TAKE 4 CAPSULES BY MOUTH 1 HOUR PRIOR TO DENTAL APPOINTMENT 16 capsule 2  . apixaban (ELIQUIS) 5 MG TABS tablet TAKE 1 TABLET BY MOUTH TWICE DAILY 60 tablet 2  . budesonide-formoterol (SYMBICORT) 160-4.5 MCG/ACT inhaler INHALE 2 PUFFS INTO THE LUNGS 2 TIMES DAILY 10.2 g 11  . colchicine 0.6 MG tablet Take 1 tablet (0.6 mg total) by mouth 2 (two) times daily as needed (for gout flare ups.). 60 tablet 1  . COVID-19 mRNA vaccine, Pfizer,  30 MCG/0.3ML injection INJECT AS DIRECTED .3 mL 0  . dapagliflozin propanediol (FARXIGA) 10 MG TABS tablet TAKE 1 TABLET (10 MG TOTAL) BY MOUTH DAILY BEFORE BREAKFAST. 30 tablet 6  . digoxin (LANOXIN) 0.125 MG tablet TAKE 1 TABLET (0.125 MG TOTAL) BY MOUTH DAILY. 30 tablet 6  . diphenhydrAMINE (SOMINEX) 25 MG tablet Take 50 mg by mouth at bedtime.    Marland Kitchen FLUoxetine (PROZAC) 20 MG capsule Take 20 mg by mouth daily.    Marland Kitchen guaiFENesin (MUCINEX) 600 MG 12 hr tablet Take 600 mg by mouth as needed for cough or to loosen phlegm.     Marland Kitchen ipratropium-albuterol (DUONEB) 0.5-2.5 (3) MG/3ML SOLN USE 1 VIAL (3ML) BY NEBULIZER EVERY 4 TO 6 HOURS AS NEEDED 270 mL 0  . losartan (COZAAR) 25 MG tablet Take 25 mg by mouth daily as needed (elevated blood pressure.).     Marland Kitchen Melatonin 10 MG TABS Take 10 mg by mouth at bedtime as needed (sleep.).     Marland Kitchen metolazone  (ZAROXOLYN) 2.5 MG tablet TAKE 1 TABLET BY MOUTH EVERY MONDAY 10 tablet 0  . metolazone (ZAROXOLYN) 2.5 MG tablet TAKE 1 TABLET (2.5 MG TOTAL) BY MOUTH EVERY MONDAY. 10 tablet 0  . montelukast (SINGULAIR) 10 MG tablet TAKE 1 TABLET BY MOUTH EVERY NIGHT AT BEDTIME 60 tablet 2  . potassium chloride SA (KLOR-CON) 20 MEQ tablet TAKE 40 MEQ (2 TABLETS) BY MOUTH EVERY MORNING, AND 20 MEQ (1 TABLET) EVERY EVENING. TAKE 40MEQ (2 TABLETS) WHEN YOU TAKE METOLAZONE 100 tablet 3  . Spacer/Aero-Holding Chambers (AEROCHAMBER MV) inhaler Use as instructed with Symbicort 1 each 0  . spironolactone (ALDACTONE) 25 MG tablet TAKE 1 TABLET (25 MG TOTAL) BY MOUTH AT BEDTIME. 30 tablet 6  . torsemide (DEMADEX) 20 MG tablet TAKE 3 TABLETS BY MOUTH TWO TIMES DAILY 180 tablet 3  . doxycycline (VIBRA-TABS) 100 MG tablet Take 1 tablet (100 mg total) by mouth 2 (two) times daily. 20 tablet 0  . predniSONE (DELTASONE) 10 MG tablet Take 2 tablets by mouth daily for 5 days (Patient not taking: Reported on 05/18/2020) 10 tablet 0   No facility-administered medications prior to visit.    Review of Systems  Review of Systems  HENT: Positive for postnasal drip.        Thrush symptoms  Respiratory: Positive for cough. Negative for chest tightness, shortness of breath and wheezing.   Gastrointestinal: Positive for nausea.  Neurological: Positive for weakness.   Physical Exam  BP 108/64 (BP Location: Right Arm, Cuff Size: Normal)   Pulse 60   Temp 97.7 F (36.5 C)   Ht 5\' 2"  (1.575 m)   Wt 117 lb (53.1 kg)   LMP  (LMP Unknown)   SpO2 97%   BMI 21.40 kg/m  Physical Exam Constitutional:      Appearance: Normal appearance.  HENT:     Head: Normocephalic and atraumatic.     Mouth/Throat:     Comments: White film to tongue, ulcerations  Cardiovascular:     Rate and Rhythm: Normal rate and regular rhythm.     Comments: No edema Pulmonary:     Effort: Pulmonary effort is normal.     Breath sounds: No wheezing,  rhonchi or rales.     Comments: Lungs sounds are significantly better; no overt rales or wheezing; O2 97%  Skin:    General: Skin is warm and dry.  Neurological:     General: No focal deficit present.     Mental  Status: She is alert and oriented to person, place, and time. Mental status is at baseline.  Psychiatric:        Mood and Affect: Mood normal.        Behavior: Behavior normal.        Thought Content: Thought content normal.        Judgment: Judgment normal.       Lab Results:  CBC    Component Value Date/Time   WBC 10.1 05/10/2020 1600   RBC 4.25 05/10/2020 1600   HGB 13.2 05/10/2020 1600   HGB 13.6 02/13/2018 1243   HCT 39.7 05/10/2020 1600   HCT 40.0 02/13/2018 1243   PLT 241.0 05/10/2020 1600   PLT 238 02/13/2018 1243   MCV 93.5 05/10/2020 1600   MCV 96 02/13/2018 1243   MCH 31.8 02/10/2020 1605   MCHC 33.3 05/10/2020 1600   RDW 16.9 (H) 05/10/2020 1600   RDW 13.2 02/13/2018 1243   LYMPHSABS 0.6 (L) 05/10/2020 1600   LYMPHSABS 1.3 02/13/2018 1243   MONOABS 0.7 05/10/2020 1600   EOSABS 0.1 05/10/2020 1600   EOSABS 0.1 02/13/2018 1243   BASOSABS 0.1 05/10/2020 1600   BASOSABS 0.1 02/13/2018 1243    BMET    Component Value Date/Time   NA 137 05/10/2020 1600   NA 143 12/25/2019 0958   K 3.0 (L) 05/10/2020 1600   CL 97 05/10/2020 1600   CO2 30 05/10/2020 1600   GLUCOSE 101 (H) 05/10/2020 1600   BUN 20 05/10/2020 1600   BUN 26 12/25/2019 0958   CREATININE 1.19 05/10/2020 1600   CREATININE 1.45 (H) 07/24/2019 1511   CALCIUM 8.9 05/10/2020 1600   GFRNONAA 52 (L) 02/10/2020 1605   GFRAA 51 (L) 12/25/2019 0958    BNP    Component Value Date/Time   BNP 980.8 (H) 02/10/2020 1605   BNP 161.0 (H) 01/20/2015 1647    ProBNP    Component Value Date/Time   PROBNP 1,971.0 (H) 05/10/2020 1600    Imaging: DG Chest 2 View  Result Date: 05/10/2020 CLINICAL DATA:  Shortness of breath, decreased oxygen, COPD exacerbation EXAM: CHEST - 2 VIEW COMPARISON:   08/05/2017 FINDINGS: LEFT subclavian pacemaker with leads projecting at RIGHT atrium, RIGHT ventricle, and coronary sinus. Enlargement of cardiac silhouette with pulmonary vascular congestion. Atherosclerotic calcification aorta. Changes of COPD with extensive scarring in the mid to lower RIGHT lung. Minimal linear scarring in lingula. No acute infiltrate, pleural effusion or pneumothorax. Osseous structures unremarkable. IMPRESSION: COPD changes with RIGHT lung scarring. Enlargement of cardiac silhouette with pulmonary vascular congestion post pacemaker. No acute abnormalities. Aortic Atherosclerosis (ICD10-I70.0) and Emphysema (ICD10-J43.9). Electronically Signed   By: Lavonia Dana M.D.   On: 05/10/2020 15:41     Assessment & Plan:   Chronic combined systolic (congestive) and diastolic (congestive) heart failure (HCC) - Treated for acute exacerbation HF last week. Her BNP was 1,971. Advised her to take additional dose of Zaroxolyn 2.5mg  x1. She is doing much better, breathing has significantly improved. Lungs are mostly clear today on exam. Maintained on Torsemide 60mg  BID, spironolactone 46m and zaroxolyn 2.5mg  every Monday along with potassium supplementation.  Recheck BNP, BMET today. She should follow up with cardiology after labs.   COPD (chronic obstructive pulmonary disease) (HCC) - Completed prednisone course and has three days left of Doxycycline - Continue Symbicort 160 two puffs twice daily and Singulair  Thrush - Likely d/t recent steroid course and ICS inhaler - RX Nystatin 59ml QID x 1-2 weeks until  symptoms resolve   Chronic allergic rhinitis - Reports PND symptoms and dry upper airway cough - RX Flonase nasal spray once daily   FU in 3 months   Martyn Ehrich, NP 05/18/2020

## 2020-05-18 NOTE — Patient Instructions (Addendum)
   Recommendations: - Continue Symbicort 160 two puffs twice daily (rinse mouth after after) - Continue diuretics and potassium supplement as prescribed unless otherwise told differently - After we get lab work back , please contact Dr. Haroldine Laws office for follow-up visit   Orders: - Labs today (BNP, BMET, CBC)  Rx: - Nystatin x4 times a day x 1-2 weeks until symptoms resolve  - Fluticasone nasal spray 1 spray per nostril daily   Follow-up: - 3 months with Dr. Halford Chessman (if there is no recall already)

## 2020-05-18 NOTE — Assessment & Plan Note (Signed)
-   Reports PND symptoms and dry upper airway cough - RX Flonase nasal spray once daily

## 2020-05-18 NOTE — Assessment & Plan Note (Signed)
-   Completed prednisone course and has three days left of Doxycycline - Continue Symbicort 160 two puffs twice daily and Singulair

## 2020-05-18 NOTE — Progress Notes (Signed)
Reviewed and agree with assessment/plan.   Reha Martinovich, MD [facility] 05/18/2020, 5:10 PM Pager:  [phone]  

## 2020-05-18 NOTE — Assessment & Plan Note (Signed)
-   Likely d/t recent steroid course and ICS inhaler - RX Nystatin 83ml QID x 1-2 weeks until symptoms resolve

## 2020-05-18 NOTE — Assessment & Plan Note (Addendum)
-   Treated for acute exacerbation HF last week. Her BNP was 1,971. Advised her to take additional dose of Zaroxolyn 2.5mg  x1. She is doing much better, breathing has significantly improved. Lungs are mostly clear today on exam. Maintained on Torsemide 60mg  BID, spironolactone 74m and zaroxolyn 2.5mg  every Monday along with potassium supplementation.  Recheck BNP, BMET today. She should follow up with cardiology after labs.

## 2020-05-19 DIAGNOSIS — G4733 Obstructive sleep apnea (adult) (pediatric): Secondary | ICD-10-CM | POA: Diagnosis not present

## 2020-05-19 LAB — CBC WITH DIFFERENTIAL/PLATELET
Basophils Absolute: 0 10*3/uL (ref 0.0–0.1)
Basophils Relative: 0.3 % (ref 0.0–3.0)
Eosinophils Absolute: 0.2 10*3/uL (ref 0.0–0.7)
Eosinophils Relative: 1.8 % (ref 0.0–5.0)
HCT: 47.3 % — ABNORMAL HIGH (ref 36.0–46.0)
Hemoglobin: 16.1 g/dL — ABNORMAL HIGH (ref 12.0–15.0)
Lymphocytes Relative: 9.8 % — ABNORMAL LOW (ref 12.0–46.0)
Lymphs Abs: 1 10*3/uL (ref 0.7–4.0)
MCHC: 33.9 g/dL (ref 30.0–36.0)
MCV: 91.9 fl (ref 78.0–100.0)
Monocytes Absolute: 0.9 10*3/uL (ref 0.1–1.0)
Monocytes Relative: 8.4 % (ref 3.0–12.0)
Neutro Abs: 8.4 10*3/uL — ABNORMAL HIGH (ref 1.4–7.7)
Neutrophils Relative %: 79.7 % — ABNORMAL HIGH (ref 43.0–77.0)
Platelets: 371 10*3/uL (ref 150.0–400.0)
RBC: 5.15 Mil/uL — ABNORMAL HIGH (ref 3.87–5.11)
RDW: 16.6 % — ABNORMAL HIGH (ref 11.5–15.5)
WBC: 10.5 10*3/uL (ref 4.0–10.5)

## 2020-05-19 LAB — BASIC METABOLIC PANEL
BUN: 57 mg/dL — ABNORMAL HIGH (ref 6–23)
CO2: 35 mEq/L — ABNORMAL HIGH (ref 19–32)
Calcium: 9.6 mg/dL (ref 8.4–10.5)
Chloride: 88 mEq/L — ABNORMAL LOW (ref 96–112)
Creatinine, Ser: 1.42 mg/dL — ABNORMAL HIGH (ref 0.40–1.20)
GFR: 37.99 mL/min — ABNORMAL LOW (ref >60.00)
Glucose, Bld: 82 mg/dL (ref 70–99)
Potassium: 3 mEq/L — ABNORMAL LOW (ref 3.5–5.1)
Sodium: 133 mEq/L — ABNORMAL LOW (ref 135–145)

## 2020-05-19 LAB — BRAIN NATRIURETIC PEPTIDE: Pro B Natriuretic peptide (BNP): 920 pg/mL — ABNORMAL HIGH (ref 0.0–100.0)

## 2020-05-19 NOTE — Progress Notes (Signed)
Please let patient know her BNP was much improved at 920 down from 1971. Still elevated would follow up with cardiology when able to get visit. Her potassium remains low. Recommend she take additional 48meq in the afternoon for 3 days.

## 2020-05-20 ENCOUNTER — Other Ambulatory Visit (HOSPITAL_BASED_OUTPATIENT_CLINIC_OR_DEPARTMENT_OTHER): Payer: Self-pay

## 2020-05-20 MED FILL — Apixaban Tab 5 MG: ORAL | 30 days supply | Qty: 60 | Fill #0 | Status: AC

## 2020-05-23 ENCOUNTER — Telehealth: Payer: Self-pay | Admitting: Primary Care

## 2020-05-23 ENCOUNTER — Other Ambulatory Visit (HOSPITAL_BASED_OUTPATIENT_CLINIC_OR_DEPARTMENT_OTHER): Payer: Self-pay

## 2020-05-23 ENCOUNTER — Telehealth: Payer: Self-pay | Admitting: Family Medicine

## 2020-05-23 ENCOUNTER — Other Ambulatory Visit: Payer: Self-pay

## 2020-05-23 DIAGNOSIS — F32A Depression, unspecified: Secondary | ICD-10-CM

## 2020-05-23 MED ORDER — FLUOXETINE HCL 20 MG PO CAPS
20.0000 mg | ORAL_CAPSULE | Freq: Every day | ORAL | 0 refills | Status: DC
  Filled 2020-05-23: qty 30, 30d supply, fill #0

## 2020-05-23 MED ORDER — FLUCONAZOLE 100 MG PO TABS
ORAL_TABLET | ORAL | 0 refills | Status: DC
  Filled 2020-05-23: qty 8, 6d supply, fill #0

## 2020-05-23 NOTE — Telephone Encounter (Signed)
I sent in fluconazole prescription. She needs to notify her PCP as her colchicine dose may need to be adjusted while on medication. If she is not currently taking colchicine then no need to worry.

## 2020-05-23 NOTE — Telephone Encounter (Signed)
Rx filled and sent to patient pharmacy 

## 2020-05-23 NOTE — Telephone Encounter (Signed)
Called and spoke with patient. She was seen by [facility] on 05/18/20 and was prescribed Nystatin rinse for her thrush. She has been using the Nystatin as prescribed and the thrush is not going away. She stated that during her OV that [name] had mentioned if the Nystatin didn't work to let her know and she would be willing to prescribe something else. She stated that she is not able to eat due to the irritation.    Pharmacy is [facility].   [name], please advise. Thanks!

## 2020-05-23 NOTE — Telephone Encounter (Signed)
Called and spoke with pt letting her know the recs stated by [facility] and she verbalized understanding. Nothing further needed.

## 2020-05-23 NOTE — Telephone Encounter (Signed)
..  Medication Refills  Last OV:  Medication: Fluoxetine 20 mg Pharmacy:[facility]  Let patient know to contact pharmacy at the end of the day to make sure medication is ready.   Please notify patient to allow 48-72 hours to process.  Encourage patient to contact the pharmacy for refills or they can request refills through [place] out below:   Last refill:  QTY:  Refill Date:    Other Comments:   Okay for refill?  Please advise.

## 2020-05-25 ENCOUNTER — Other Ambulatory Visit (HOSPITAL_COMMUNITY): Payer: Self-pay | Admitting: Internal Medicine

## 2020-05-25 ENCOUNTER — Other Ambulatory Visit (HOSPITAL_BASED_OUTPATIENT_CLINIC_OR_DEPARTMENT_OTHER): Payer: Self-pay

## 2020-05-25 MED ORDER — METOLAZONE 2.5 MG PO TABS
ORAL_TABLET | ORAL | 0 refills | Status: DC
  Filled 2020-05-25: qty 10, fill #0

## 2020-05-26 ENCOUNTER — Other Ambulatory Visit (HOSPITAL_COMMUNITY): Payer: Self-pay | Admitting: Internal Medicine

## 2020-05-26 ENCOUNTER — Other Ambulatory Visit (HOSPITAL_BASED_OUTPATIENT_CLINIC_OR_DEPARTMENT_OTHER): Payer: Self-pay

## 2020-05-27 ENCOUNTER — Other Ambulatory Visit (HOSPITAL_BASED_OUTPATIENT_CLINIC_OR_DEPARTMENT_OTHER): Payer: Self-pay

## 2020-05-27 MED ORDER — METOLAZONE 2.5 MG PO TABS
2.5000 mg | ORAL_TABLET | ORAL | 2 refills | Status: DC
  Filled 2020-05-27: qty 12, 84d supply, fill #0

## 2020-05-30 ENCOUNTER — Telehealth (HOSPITAL_COMMUNITY): Payer: Self-pay | Admitting: *Deleted

## 2020-05-30 ENCOUNTER — Other Ambulatory Visit (HOSPITAL_BASED_OUTPATIENT_CLINIC_OR_DEPARTMENT_OTHER): Payer: Self-pay

## 2020-05-30 ENCOUNTER — Encounter (HOSPITAL_COMMUNITY): Payer: Self-pay | Admitting: *Deleted

## 2020-05-30 DIAGNOSIS — E876 Hypokalemia: Secondary | ICD-10-CM

## 2020-05-30 DIAGNOSIS — I5042 Chronic combined systolic (congestive) and diastolic (congestive) heart failure: Secondary | ICD-10-CM

## 2020-05-30 MED ORDER — METOLAZONE 2.5 MG PO TABS
2.5000 mg | ORAL_TABLET | ORAL | 2 refills | Status: DC
  Filled 2020-05-30: qty 12, 84d supply, fill #0
  Filled 2020-06-02: qty 12, 42d supply, fill #0
  Filled 2020-06-02: qty 12, 84d supply, fill #0

## 2020-05-30 NOTE — Telephone Encounter (Signed)
Pt called to report she is out of her Metolazone and pharmacy will not let her get more as it is to soon for refill. Pt saw pulmonary on 5/3 and was supposed to take 1 extra dose of metolazone however she misunderstand and has been taking it twice a week since then which is why she ran out early. Pt states she took last dose today, she states she will not take anymore until next Monday unless we advise her differently. Pt had lab work done 5/11 with K 3.0, Cr 1.42, bun 57, advised pt she needs recheck labs she is agreeable and will go to [place] for labs, order placed. New rx for Metolazone sent in, advised pt not to take any until she hears from [place].

## 2020-05-30 NOTE — Progress Notes (Signed)
Pt's FMLA forms for intermittent leave has been completed, signed by Dr Haroldine Laws and faxed to [employer] at [phone], pt is aware this has been done

## 2020-06-02 ENCOUNTER — Other Ambulatory Visit (HOSPITAL_BASED_OUTPATIENT_CLINIC_OR_DEPARTMENT_OTHER): Payer: Self-pay

## 2020-06-02 MED FILL — Torsemide Tab 20 MG: ORAL | 30 days supply | Qty: 180 | Fill #1 | Status: AC

## 2020-06-02 MED FILL — Dapagliflozin Propanediol Tab 10 MG (Base Equivalent): ORAL | 30 days supply | Qty: 30 | Fill #1 | Status: AC

## 2020-06-03 ENCOUNTER — Other Ambulatory Visit (HOSPITAL_BASED_OUTPATIENT_CLINIC_OR_DEPARTMENT_OTHER): Payer: Self-pay

## 2020-06-08 ENCOUNTER — Other Ambulatory Visit (HOSPITAL_COMMUNITY): Payer: Self-pay | Admitting: Internal Medicine

## 2020-06-08 ENCOUNTER — Other Ambulatory Visit (HOSPITAL_BASED_OUTPATIENT_CLINIC_OR_DEPARTMENT_OTHER): Payer: Self-pay

## 2020-06-08 MED FILL — Spironolactone Tab 25 MG: ORAL | 90 days supply | Qty: 90 | Fill #0 | Status: AC

## 2020-06-15 ENCOUNTER — Other Ambulatory Visit (HOSPITAL_BASED_OUTPATIENT_CLINIC_OR_DEPARTMENT_OTHER): Payer: Self-pay

## 2020-06-16 ENCOUNTER — Other Ambulatory Visit (HOSPITAL_BASED_OUTPATIENT_CLINIC_OR_DEPARTMENT_OTHER): Payer: Self-pay

## 2020-06-16 MED FILL — Digoxin Tab 125 MCG (0.125 MG): ORAL | 30 days supply | Qty: 30 | Fill #1 | Status: AC

## 2020-06-17 ENCOUNTER — Other Ambulatory Visit: Payer: Self-pay

## 2020-06-17 ENCOUNTER — Ambulatory Visit (HOSPITAL_COMMUNITY)
Admission: RE | Admit: 2020-06-17 | Discharge: 2020-06-17 | Disposition: A | Discharge status: Home / Self Care | Payer: 59 | Source: Ambulatory Visit | Attending: Internal Medicine | Admitting: Internal Medicine

## 2020-06-17 ENCOUNTER — Other Ambulatory Visit (HOSPITAL_BASED_OUTPATIENT_CLINIC_OR_DEPARTMENT_OTHER): Payer: Self-pay

## 2020-06-17 ENCOUNTER — Encounter (HOSPITAL_COMMUNITY): Payer: Self-pay | Admitting: Internal Medicine

## 2020-06-17 ENCOUNTER — Ambulatory Visit (HOSPITAL_BASED_OUTPATIENT_CLINIC_OR_DEPARTMENT_OTHER)
Admission: RE | Admit: 2020-06-17 | Discharge: 2020-06-17 | Disposition: A | Discharge status: Home / Self Care | Payer: 59 | Source: Ambulatory Visit | Attending: Internal Medicine | Admitting: Internal Medicine

## 2020-06-17 VITALS — BP 114/60 | HR 78 | Wt 120.0 lb

## 2020-06-17 DIAGNOSIS — Z7951 Long term (current) use of inhaled steroids: Secondary | ICD-10-CM | POA: Diagnosis not present

## 2020-06-17 DIAGNOSIS — I5022 Chronic systolic (congestive) heart failure: Secondary | ICD-10-CM | POA: Diagnosis not present

## 2020-06-17 DIAGNOSIS — Z950 Presence of cardiac pacemaker: Secondary | ICD-10-CM | POA: Diagnosis not present

## 2020-06-17 DIAGNOSIS — I422 Other hypertrophic cardiomyopathy: Secondary | ICD-10-CM | POA: Insufficient documentation

## 2020-06-17 DIAGNOSIS — I080 Rheumatic disorders of both mitral and aortic valves: Secondary | ICD-10-CM | POA: Insufficient documentation

## 2020-06-17 DIAGNOSIS — Z79899 Other long term (current) drug therapy: Secondary | ICD-10-CM | POA: Diagnosis not present

## 2020-06-17 DIAGNOSIS — Z87891 Personal history of nicotine dependence: Secondary | ICD-10-CM | POA: Insufficient documentation

## 2020-06-17 DIAGNOSIS — Z888 Allergy status to other drugs, medicaments and biological substances status: Secondary | ICD-10-CM | POA: Insufficient documentation

## 2020-06-17 DIAGNOSIS — I421 Obstructive hypertrophic cardiomyopathy: Secondary | ICD-10-CM | POA: Diagnosis not present

## 2020-06-17 DIAGNOSIS — I5042 Chronic combined systolic (congestive) and diastolic (congestive) heart failure: Secondary | ICD-10-CM | POA: Diagnosis not present

## 2020-06-17 DIAGNOSIS — N1832 Chronic kidney disease, stage 3b: Secondary | ICD-10-CM | POA: Diagnosis not present

## 2020-06-17 DIAGNOSIS — J449 Chronic obstructive pulmonary disease, unspecified: Secondary | ICD-10-CM | POA: Insufficient documentation

## 2020-06-17 DIAGNOSIS — N1831 Chronic kidney disease, stage 3a: Secondary | ICD-10-CM | POA: Diagnosis not present

## 2020-06-17 DIAGNOSIS — I4819 Other persistent atrial fibrillation: Secondary | ICD-10-CM

## 2020-06-17 DIAGNOSIS — Z7901 Long term (current) use of anticoagulants: Secondary | ICD-10-CM | POA: Insufficient documentation

## 2020-06-17 DIAGNOSIS — Z7984 Long term (current) use of oral hypoglycemic drugs: Secondary | ICD-10-CM | POA: Diagnosis not present

## 2020-06-17 LAB — ECHOCARDIOGRAM COMPLETE
Calc EF: 36 %
MV M vel: 3.84 m/s
MV Peak grad: 59 mmHg
P 1/2 time: 550 msec
Radius: 0.2 cm
S' Lateral: 4.7 cm
Single Plane A2C EF: 36.6 %
Single Plane A4C EF: 37.6 %

## 2020-06-17 LAB — BASIC METABOLIC PANEL
Anion gap: 13 (ref 5–15)
BUN: 17 mg/dL (ref 8–23)
CO2: 25 mmol/L (ref 22–32)
Calcium: 8.9 mg/dL (ref 8.9–10.3)
Chloride: 98 mmol/L (ref 98–111)
Creatinine, Ser: 1 mg/dL (ref 0.44–1.00)
GFR, Estimated: >60 mL/min (ref >60)
Glucose, Bld: 81 mg/dL (ref 70–99)
Potassium: 3.2 mmol/L — ABNORMAL LOW (ref 3.5–5.1)
Sodium: 136 mmol/L (ref 135–145)

## 2020-06-17 MED ORDER — METOLAZONE 2.5 MG PO TABS
2.5000 mg | ORAL_TABLET | ORAL | 2 refills | Status: AC
  Filled 2020-06-17: qty 15, 52d supply, fill #0
  Filled 2020-07-19: qty 15, 53d supply, fill #0
  Filled 2020-09-07: qty 15, 53d supply, fill #1

## 2020-06-17 NOTE — Patient Instructions (Signed)
Increase Metolazone to 2.5 mg every Monday and Friday   Labs done today, your results will be available in MyChart, we will contact you for abnormal readings.  Your physician has recommended that you have a pulmonary function test. Pulmonary Function Tests are a group of tests that measure how well air moves in and out of your lungs.  Your physician recommends that you schedule a follow-up appointment in: 2-3 months  If you have any questions or concerns before your next appointment please send [place] a message through [place] or call our office at [phone].    TO LEAVE A MESSAGE FOR THE NURSE SELECT OPTION 2, PLEASE LEAVE A MESSAGE INCLUDING: YOUR NAME DATE OF BIRTH CALL BACK NUMBER REASON FOR CALL**this is important as we prioritize the call backs  YOU WILL RECEIVE A CALL BACK THE SAME DAY AS LONG AS YOU CALL BEFORE 4:00 PM  At the [facility], you and your health needs are our priority. As part of our continuing mission to provide you with exceptional heart care, we have created designated Provider Care Teams. These Care Teams include your primary Cardiologist (physician) and Advanced Practice Providers (APPs- Physician Assistants and Nurse Practitioners) who all work together to provide you with the care you need, when you need it.   You may see any of the following providers on your designated Care Team at your next follow up: Dr Glori Bickers Dr Loralie Champagne Dr Patrice Paradise, NP Lyda Jester, [place] Ginnie Smart Audry Riles, PharmD   Please be sure to bring in all your medications bottles to every appointment.

## 2020-06-17 NOTE — Progress Notes (Signed)
  Echocardiogram 2D Echocardiogram has been performed.  Angela Shepherd 06/17/2020, 2:05 PM

## 2020-06-17 NOTE — Progress Notes (Signed)
ReDS Vest / Clip - 06/17/20 1400       ReDS Vest / Clip   Station Marker A    Ruler Value 24    ReDS Value Range High volume overload    ReDS Actual Value 49

## 2020-06-17 NOTE — Progress Notes (Signed)
Advanced HF Clinic Note   Date:  06/17/2020   ID:  NECHAMA ESCUTIA, DOB [birth year], MRN [id number]  Location: Home  Provider location: [facility] Type of Visit: Established patient  PCP:  Midge Minium, MD  Cardiologist:  None Primary HF: Mira Balon  Chief Complaint: Heart Failure follow-up   History of Present Illness:  Asmaa is a 70 y.o. 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 She has a h/o HOCM, systolic HF due to NICM s/p CRT-D, severe COPD and PAF s/p ablation x 2.   Was admitted in December 2011 with acute HF. Echo with EF 30-35%, There was also evidence of ASH with septum of 1.7 PW = 1.9.Cath 12/2009: Normal coronaries. EF 40-45%.     Was seen by EP and noted to have a high percentage of RV pacing (99%) and it was felt this was worsening LV function. Underwent upgrade to [facility]. Jude CRT-P in 5/14.     Seen by [facility] in [facility] and found to be carrier for HOCM. No LVOT obstruction clinically.   Admitted in 6/19 for syncope with SBP 71/59. Arlyce Harman and losartan stopped. Echo with EF 35-40%  She also has atrial fibrillation and had AF ablation x 2 with Dr. Curt Bears 11/18/2017. & 12/26/18.  Admitted in [facility] in 5/21 with acute respiratory failure and delirium.Intubated. With lactic acid 5.3. Found to have R-sided PNA and pulmory edema. Treated with abx and IV lasix. Tikosyn stopped. Echo EF 20-25%.   I last saw her 10/21. Volume up. Torsemide 60 bid restarted. Also started metolazone 2.5 given for prn use.   Echo 12/21 EF 25-30% RV mild HK.   Here for routine f/u with her husband. Has good days and bad days. On bad days can barely do anything without stopping and getting SOB. On good day can do basic ADLs without too much problem. Says symptoms much worse if fluid up. Taking torsemide 60 mg BID and metolazone 2.5 weekly   Echo today 06/17/20 EF 25% moderate RV dysfunction Personally  reviewed   Cardiac studies Echo in March 2012:  EF 50-55%. Grade 2 diastolic dysfunction. IVS 1.4cm.  Echo 4/14: EF 40-45%  Echo 4/16: EF 45-50% Echo 6/19 EF 35-40% Mild LVH no LVOT obstruction  Echo 05/2019 EF 20-25% at [facility]  Cath 06/05/19 No CAD. EF 20% Ao = 92/60 LV =  89/19 RA = 10 RV = 68/12 PA = 64/25 (39) PCW = 23 Fick cardiac output/index = 2.5/1.6 PVR = 6.5 WU Ao sat = 99% PA sat = 57%, 58% PAPi = 3.9   PFTs 7/21 FEV1 0.88L FVC 1.56 DLCO 41%    Past Medical History:  Diagnosis Date   Bradycardia    a. initial PPM placed 1994 at [facility] with gen change 2002. b. 1*AVB & intermittent 2nd degree AVB + CHF --> upgrade to [facility]. Jude BiV PPM 05/08/12.   Chronic systolic CHF (congestive heart failure) ([place])    a. EF 30-35% dx in 2011 -> most recent 40-45% by echo 04/2012. b. s/p upgrade to BiV-PPM 05/08/12. c. Med titration limited by hypotension.   Cluster headache syndrome    Hypertr obst cardiomyop    Hypokalemia    Hypotension    Paroxysmal atrial fibrillation ([place])    a. identified on PPM interrogation b. longest episode 1 hour; if burden increases, will need [place]    Pneumonia 2008   Sleep apnea    Past  Surgical History:  Procedure Laterality Date   ATRIAL FIBRILLATION ABLATION N/A 11/15/2017   Procedure: ATRIAL FIBRILLATION ABLATION;  Surgeon: Constance Haw, MD;  Location: [place] [facility];  Service: Cardiovascular;  Laterality: N/A;   ATRIAL FIBRILLATION ABLATION N/A 12/26/2018   Procedure: ATRIAL FIBRILLATION ABLATION;  Surgeon: Constance Haw, MD;  Location: [place] [facility];  Service: Cardiovascular;  Laterality: N/A;   BI-VENTRICULAR PACEMAKER UPGRADE N/A 05/08/2012   upgrade to [facility] (CRT-P) by Dr Rayann Heman   BIV PACEMAKER GENERATOR CHANGEOUT N/A 12/16/2019   Procedure: BIV PACEMAKER GENERATOR CHANGEOUT;  Surgeon: Constance Haw, MD;  Location: [place] [facility];  Service: Cardiovascular;  Laterality: N/A;   CARDIOVERSION N/A  09/19/2017   Procedure: CARDIOVERSION;  Surgeon: Sanda Klein, MD;  Location: [facility];  Service: Cardiovascular;  Laterality: N/A;   CARDIOVERSION N/A 10/01/2018   Procedure: CARDIOVERSION;  Surgeon: Donato Heinz, MD;  Location: [place];  Service: Endoscopy;  Laterality: N/A;   PACEMAKER INSERTION  1994; 2002; 05/08/2012   RIGHT/LEFT HEART CATH AND CORONARY ANGIOGRAPHY N/A 06/05/2019   Procedure: RIGHT/LEFT HEART CATH AND CORONARY ANGIOGRAPHY;  Surgeon: Jolaine Artist, MD;  Location: [place] [facility];  Service: Cardiovascular;  Laterality: N/A;   TONSILLECTOMY  ~ [place]?     Current Outpatient Medications  Medication Sig Dispense Refill   acetaminophen (TYLENOL) 500 MG tablet Take 500-1,000 mg by mouth every 6 (six) hours as needed (for pain).     albuterol (VENTOLIN HFA) 108 (90 Base) MCG/ACT inhaler INHALE 1 - 2 PUFFS INTO THE LUNGS EVERY 6 HOURS AS NEEDED FOR WHEEZING OR SHORTNESS OF BREATH 18 g 6   allopurinol (ZYLOPRIM) 100 MG tablet Take 50 mg by mouth daily as needed (gout flares.).      ALPRAZolam (XANAX) 0.25 MG tablet TAKE 1 TABLET BY MOUTH TWICE DAILY AS NEEDED FOR ANXIETY 30 tablet 0   amitriptyline (ELAVIL) 10 MG tablet Take 10 mg by mouth daily as needed (migraine).      amoxicillin (AMOXIL) 500 MG capsule TAKE 4 CAPSULES BY MOUTH 1 HOUR PRIOR TO DENTAL APPOINTMENT 16 capsule 2   apixaban (ELIQUIS) 5 MG TABS tablet TAKE 1 TABLET BY MOUTH TWICE DAILY 60 tablet 2   budesonide-formoterol (SYMBICORT) 160-4.5 MCG/ACT inhaler INHALE 2 PUFFS INTO THE LUNGS 2 TIMES DAILY 10.2 g 11   colchicine 0.6 MG tablet Take 1 tablet (0.6 mg total) by mouth 2 (two) times daily as needed (for gout flare ups.). 60 tablet 1   dapagliflozin propanediol (FARXIGA) 10 MG TABS tablet TAKE 1 TABLET (10 MG TOTAL) BY MOUTH DAILY BEFORE BREAKFAST. 30 tablet 6   digoxin (LANOXIN) 0.125 MG tablet TAKE 1 TABLET (0.125 MG TOTAL) BY MOUTH DAILY. 30 tablet 6    diphenhydrAMINE (SOMINEX) 25 MG tablet Take 50 mg by mouth at bedtime.     FLUoxetine (PROZAC) 20 MG capsule Take 1 capsule (20 mg total) by mouth daily. 30 capsule 0   fluticasone (FLONASE) 50 MCG/ACT nasal spray Place 1 spray into both nostrils daily. 16 g 2   guaiFENesin (MUCINEX) 600 MG 12 hr tablet Take 600 mg by mouth as needed for cough or to loosen phlegm.      ipratropium-albuterol (DUONEB) 0.5-2.5 (3) MG/3ML SOLN USE 1 VIAL (3ML) BY NEBULIZER EVERY 4 TO 6 HOURS AS NEEDED 270 mL 0   losartan (COZAAR) 25 MG tablet Take 25 mg by mouth daily as needed (elevated blood pressure.).  Melatonin 10 MG TABS Take 10 mg by mouth at bedtime as needed (sleep.).      metolazone (ZAROXOLYN) 2.5 MG tablet Take 1 tablet (2.5 mg total) by mouth once a week. And extra tab as directed 12 tablet 2   montelukast (SINGULAIR) 10 MG tablet TAKE 1 TABLET BY MOUTH EVERY NIGHT AT BEDTIME 60 tablet 2   potassium chloride SA (KLOR-CON) 20 MEQ tablet TAKE 40 MEQ (2 TABLETS) BY MOUTH EVERY MORNING, AND 20 MEQ (1 TABLET) EVERY EVENING. TAKE 40MEQ (2 TABLETS) WHEN YOU TAKE METOLAZONE 100 tablet 3   Spacer/Aero-Holding Chambers (AEROCHAMBER MV) inhaler Use as instructed with Symbicort 1 each 0   spironolactone (ALDACTONE) 25 MG tablet TAKE 1 TABLET (25 MG TOTAL) BY MOUTH AT BEDTIME. 30 tablet 6   torsemide (DEMADEX) 20 MG tablet TAKE 3 TABLETS BY MOUTH TWO TIMES DAILY 180 tablet 3   COVID-19 mRNA vaccine, Pfizer, 30 MCG/0.3ML injection INJECT AS DIRECTED .3 mL 0   No current facility-administered medications for this encounter.    Allergies:   Prednisone   Social History:  The patient  reports that she quit smoking about 23 months ago. Her smoking use included cigarettes. She has a 15.00 pack-year smoking history. She has never used smokeless tobacco. She reports current alcohol use of about 2.0 standard drinks of alcohol per week. She reports that she does not use drugs.   Family History:  The patient's family  history includes Cancer (age of onset: 42) in her sister; Cardiomyopathy in her sister; Dementia in her mother; Diabetes in her brother and another family member; Heart disease in her sister.   ROS:  Please see the history of present illness.   All other systems are personally reviewed and negative.   Vitals:   06/17/20 1411  BP: 114/60  Pulse: 78  SpO2: 94%  Weight: 54.4 kg (120 lb)    Exam:   General:  Weak appearing. No resp difficulty HEENT: normal Neck: supple. JVP 8 Carotids 2+ bilat; no bruits. No lymphadenopathy or thryomegaly appreciated. Cor: PMI nondisplaced. Regular rate & rhythm. 2/6 MR Lungs: clear Abdomen: soft, nontender, nondistended. No hepatosplenomegaly. No bruits or masses. Good bowel sounds. Extremities: no cyanosis, clubbing, rash, edema Neuro: alert & orientedx3, cranial nerves grossly intact. moves all 4 extremities w/o difficulty. Affect pleasant   ICD interrogated: No VT/AF. No impedance recorded. 99% biv paced Personally reviewed  Recent Labs: 07/24/2019: ALT 25; TSH 2.40 02/10/2020: B Natriuretic Peptide 980.8 05/18/2020: BUN 57; Creatinine, Ser 1.42; Hemoglobin 16.1; Platelets 371.0; Potassium 3.0; Pro B Natriuretic peptide (BNP) 920.0; Sodium 133  Personally reviewed   Wt Readings from Last 3 Encounters:  06/17/20 54.4 kg (120 lb)  05/18/20 53.1 kg (117 lb)  05/10/20 55.8 kg (123 lb)      ASSESSMENT AND PLAN:  1. Chronic systolic CHF: EF 82% s/p CRT-P upgrade 05/2012 (due to chronic RV pacing)  - Echo 6/19: LVEF 35-40%, mild MR, Mild TR - Echo in [place], [place] 5/21 EF 20-25% - Cath 5/21. No CAD. EF 20% CI 1.6 - Echo 12/21 EF 25-30% RV mild HK  - Echo today 06/17/20 EF 25% moderate RV dysfunction Personally reviewed - Worse NYHA III-IIIB - Volume status markedly elevated REDS 49%  - Continue torsemide 60 mg BID - Increase metolazone from 2.5 weekly to 2.5 Monday and Friday K - Bisoprolol stopped due to resp failure and low output - Continue  losartan 50 daily. BP too soft to switch to [employer] - Continue spiro  25 daily.  - Continue Farxiga 10 mg daily.  - Continue digoxin 0.125mg  daily - We again discussed potential need for advanced therapies but lung disease is prohibitive with FEV1 0.88 and DLCO 41% on PFTs 7/21 - Echo today 06/17/20 EF 25% moderate MR and AI moderate RV dysfunction Personally reviewed - We discussed the fact that she is nearing end-stage and volume management is getting much more difficult. She has not been candidate for advanced therapies due to severe lung disease. Will repeat PFTs. Screen for BaroStim  2. H/o HOCM  - No LVOT gradient - Has been seen in [facility]. All 1st degree relatives have been tested - No change  3. Tobacco use/COPD - Says she has been abstinent since 7/20 - PFTs as above. Slightly improved off after stopping smoking but prohibitive for advanced therapies - No change  4. Persistent AF/AFL - Follows with Dr. Curt Bears.  - Remains in NSR - Had repeat AF No bleeding  5. CKD 3a - stable  - continue [place] with Dr. Joylene Grapes - labs today  Glori Bickers, MD  2:25 PM   [facility] [address] [facility] [place] [address] [phone] (office) [phone] (fax)

## 2020-06-20 ENCOUNTER — Other Ambulatory Visit (HOSPITAL_COMMUNITY): Payer: Self-pay | Admitting: Surgery

## 2020-06-20 ENCOUNTER — Encounter (HOSPITAL_COMMUNITY): Payer: Self-pay

## 2020-06-20 ENCOUNTER — Other Ambulatory Visit (HOSPITAL_BASED_OUTPATIENT_CLINIC_OR_DEPARTMENT_OTHER): Payer: Self-pay

## 2020-06-20 DIAGNOSIS — E876 Hypokalemia: Secondary | ICD-10-CM

## 2020-06-20 DIAGNOSIS — I5022 Chronic systolic (congestive) heart failure: Secondary | ICD-10-CM

## 2020-06-20 MED ORDER — POTASSIUM CHLORIDE CRYS ER 20 MEQ PO TBCR
40.0000 meq | EXTENDED_RELEASE_TABLET | Freq: Two times a day (BID) | ORAL | 6 refills | Status: DC
  Filled 2020-06-20: qty 120, 30d supply, fill #0

## 2020-06-20 NOTE — Progress Notes (Signed)
  Patient instructed to take an extra 80 meq of potassium daily for 2 days and then increase to 40 meq bid- per Dr. Haroldine Laws.  She is aware and agreeable.  She will return to have labwork on 6/20 at [place].

## 2020-06-23 ENCOUNTER — Encounter (HOSPITAL_COMMUNITY): Payer: Self-pay

## 2020-06-27 ENCOUNTER — Other Ambulatory Visit (HOSPITAL_COMMUNITY)
Admission: RE | Admit: 2020-06-27 | Discharge: 2020-06-27 | Disposition: A | Discharge status: Home / Self Care | Payer: 59 | Source: Ambulatory Visit | Attending: Internal Medicine | Admitting: Internal Medicine

## 2020-06-27 ENCOUNTER — Other Ambulatory Visit (HOSPITAL_BASED_OUTPATIENT_CLINIC_OR_DEPARTMENT_OTHER): Payer: Self-pay

## 2020-06-27 DIAGNOSIS — Z01812 Encounter for preprocedural laboratory examination: Secondary | ICD-10-CM | POA: Insufficient documentation

## 2020-06-27 DIAGNOSIS — Z20822 Contact with and (suspected) exposure to covid-19: Secondary | ICD-10-CM | POA: Diagnosis not present

## 2020-06-27 LAB — SARS CORONAVIRUS 2 (TAT 6-24 HRS): SARS Coronavirus 2: NEGATIVE

## 2020-06-29 ENCOUNTER — Ambulatory Visit (HOSPITAL_COMMUNITY)
Admission: RE | Admit: 2020-06-29 | Discharge: 2020-06-29 | Disposition: A | Discharge status: Home / Self Care | Payer: 59 | Source: Ambulatory Visit | Attending: Internal Medicine | Admitting: Internal Medicine

## 2020-06-29 ENCOUNTER — Other Ambulatory Visit: Payer: Self-pay

## 2020-06-29 ENCOUNTER — Other Ambulatory Visit (HOSPITAL_BASED_OUTPATIENT_CLINIC_OR_DEPARTMENT_OTHER): Payer: Self-pay

## 2020-06-29 ENCOUNTER — Other Ambulatory Visit: Payer: Self-pay | Admitting: Family Medicine

## 2020-06-29 DIAGNOSIS — F32A Depression, unspecified: Secondary | ICD-10-CM

## 2020-06-29 DIAGNOSIS — I5022 Chronic systolic (congestive) heart failure: Secondary | ICD-10-CM | POA: Insufficient documentation

## 2020-06-29 LAB — PULMONARY FUNCTION TEST
DL/VA % pred: 49 %
DL/VA: 2.11 ml/min/mmHg/L
DLCO unc % pred: 37 %
DLCO unc: 6.44 ml/min/mmHg
FEF 25-75 Post: 0.67 L/sec
FEF 25-75 Pre: 0.48 L/sec
FEF2575-%Change-Post: 38 %
FEF2575-%Pred-Post: 37 %
FEF2575-%Pred-Pre: 26 %
FEV1-%Change-Post: 11 %
FEV1-%Pred-Post: 57 %
FEV1-%Pred-Pre: 51 %
FEV1-Post: 1.15 L
FEV1-Pre: 1.03 L
FEV1FVC-%Change-Post: 9 %
FEV1FVC-%Pred-Pre: 80 %
FEV6-%Change-Post: 2 %
FEV6-%Pred-Post: 67 %
FEV6-%Pred-Pre: 66 %
FEV6-Post: 1.7 L
FEV6-Pre: 1.67 L
FEV6FVC-%Pred-Post: 104 %
FEV6FVC-%Pred-Pre: 104 %
FVC-%Change-Post: 2 %
FVC-%Pred-Post: 64 %
FVC-%Pred-Pre: 63 %
FVC-Post: 1.7 L
FVC-Pre: 1.67 L
Post FEV1/FVC ratio: 67 %
Post FEV6/FVC ratio: 100 %
Pre FEV1/FVC ratio: 61 %
Pre FEV6/FVC Ratio: 100 %
RV % pred: 102 %
RV: 2.03 L
TLC % pred: 86 %
TLC: 3.92 L

## 2020-06-29 MED ORDER — ALBUTEROL SULFATE (2.5 MG/3ML) 0.083% IN NEBU
2.5000 mg | INHALATION_SOLUTION | Freq: Once | RESPIRATORY_TRACT | Status: AC
  Administered 2020-06-29: 2.5 mg via RESPIRATORY_TRACT

## 2020-06-29 MED ORDER — FLUOXETINE HCL 20 MG PO CAPS
20.0000 mg | ORAL_CAPSULE | Freq: Every day | ORAL | 0 refills | Status: DC
  Filled 2020-06-29: qty 30, 30d supply, fill #0

## 2020-06-30 ENCOUNTER — Encounter: Payer: Self-pay | Admitting: Student

## 2020-06-30 ENCOUNTER — Ambulatory Visit (INDEPENDENT_AMBULATORY_CARE_PROVIDER_SITE_OTHER): Payer: 59 | Admitting: Student

## 2020-06-30 ENCOUNTER — Other Ambulatory Visit: Payer: 59

## 2020-06-30 VITALS — BP 90/60 | HR 81 | Ht 60.5 in | Wt 118.6 lb

## 2020-06-30 DIAGNOSIS — N1832 Chronic kidney disease, stage 3b: Secondary | ICD-10-CM | POA: Diagnosis not present

## 2020-06-30 DIAGNOSIS — E876 Hypokalemia: Secondary | ICD-10-CM

## 2020-06-30 DIAGNOSIS — I421 Obstructive hypertrophic cardiomyopathy: Secondary | ICD-10-CM

## 2020-06-30 DIAGNOSIS — I5022 Chronic systolic (congestive) heart failure: Secondary | ICD-10-CM

## 2020-06-30 DIAGNOSIS — I4819 Other persistent atrial fibrillation: Secondary | ICD-10-CM

## 2020-06-30 LAB — BASIC METABOLIC PANEL
BUN/Creatinine Ratio: 26 (ref 12–28)
BUN: 29 mg/dL — ABNORMAL HIGH (ref 8–27)
CO2: 32 mmol/L — ABNORMAL HIGH (ref 20–29)
Calcium: 8.8 mg/dL (ref 8.7–10.3)
Chloride: 93 mmol/L — ABNORMAL LOW (ref 96–106)
Creatinine, Ser: 1.1 mg/dL — ABNORMAL HIGH (ref 0.57–1.00)
Glucose: 70 mg/dL (ref 65–99)
Potassium: 3.4 mmol/L — ABNORMAL LOW (ref 3.5–5.2)
Sodium: 136 mmol/L (ref 134–144)
eGFR: 55 mL/min/{1.73_m2} — ABNORMAL LOW (ref >59)

## 2020-06-30 NOTE — Patient Instructions (Signed)
Medication Instructions:  Your physician recommends that you continue on your current medications as directed. Please refer to the Current Medication list given to you today.  *If you need a refill on your cardiac medications before your next appointment, please call your pharmacy*   Lab Work: TODAY: BMET, BNP  If you have labs (blood work) drawn today and your tests are completely normal, you will receive your results only by: [place] (if you have MyChart) OR A paper copy in the mail If you have any lab test that is abnormal or we need to change your treatment, we will call you to review the results.   Follow-Up: At [facility], you and your health needs are our priority.  As part of our continuing mission to provide you with exceptional heart care, we have created designated Provider Care Teams.  These Care Teams include your primary Cardiologist (physician) and Advanced Practice Providers (APPs -  Physician Assistants and Nurse Practitioners) who all work together to provide you with the care you need, when you need it.   Your next appointment:   4 month(s)  The format for your next appointment:   In Person  Provider:   Allegra Lai, MD

## 2020-06-30 NOTE — Progress Notes (Signed)
Electrophysiology Office Note Date: 06/30/2020  ID:  Angela, Shepherd [birth year], MRN [id number]  PCP: Midge Minium, MD Primary Cardiologist: None Electrophysiologist: Will Meredith Leeds, MD  Primary HF MD: Dr. Haroldine Laws   CC: Routine ICD follow-up  Angela Shepherd is a 70 y.o. female seen today for Will Meredith Leeds, MD for  consideration of barostim therapy .    Echo 06/2020 LVEF 25-30% despite GDMT and CRT  She has previously undergone A-V optimization and V-V optimization without significant improvement.   She has recently required up-titration of her metolazone due to ongoing issues with SOB. She has "good days and bad days". At worse, she can "barely do anything" without SOB. See KCCQ-12 today with significant limitation in multiple areas.  She sleeps on 3 pillows chronically. She stopped smoking 2 years ago.   Since last being seen in AF clinic the patient reports doing about the same. She is discouraged that she is not a candidate for VAD, transplant, etc. She has not had ICD shocks. She is excited about the prospect of any additional therapy to help her symptoms, but understands her HF may be too advanced.   Device History: St. Jude BiV ICD implanted 1994, gen change 2021, BiV upgrade 05/2012  Past Medical History:  Diagnosis Date   Bradycardia    a. initial PPM placed 1994 at [facility] with gen change 2002. b. 1*AVB & intermittent 2nd degree AVB + CHF --> upgrade to [facility]. Jude BiV PPM 05/08/12.   Chronic systolic CHF (congestive heart failure) ([place])    a. EF 30-35% dx in 2011 -> most recent 40-45% by echo 04/2012. b. s/p upgrade to BiV-PPM 05/08/12. c. Med titration limited by hypotension.   Cluster headache syndrome    Hypertr obst cardiomyop    Hypokalemia    Hypotension    Paroxysmal atrial fibrillation ([place])    a. identified on PPM interrogation b. longest episode 1 hour; if burden increases, will need [place]    Pneumonia 2008   Sleep apnea     Past Surgical History:  Procedure Laterality Date   ATRIAL FIBRILLATION ABLATION N/A 11/15/2017   Procedure: ATRIAL FIBRILLATION ABLATION;  Surgeon: Constance Haw, MD;  Location: [place] [facility];  Service: Cardiovascular;  Laterality: N/A;   ATRIAL FIBRILLATION ABLATION N/A 12/26/2018   Procedure: ATRIAL FIBRILLATION ABLATION;  Surgeon: Constance Haw, MD;  Location: [place] [facility];  Service: Cardiovascular;  Laterality: N/A;   BI-VENTRICULAR PACEMAKER UPGRADE N/A 05/08/2012   upgrade to SJM Anthem (CRT-P) by Dr Rayann Heman   BIV PACEMAKER GENERATOR CHANGEOUT N/A 12/16/2019   Procedure: BIV PACEMAKER GENERATOR CHANGEOUT;  Surgeon: Constance Haw, MD;  Location: [place] [facility];  Service: Cardiovascular;  Laterality: N/A;   CARDIOVERSION N/A 09/19/2017   Procedure: CARDIOVERSION;  Surgeon: Sanda Klein, MD;  Location: [place] [facility];  Service: Cardiovascular;  Laterality: N/A;   CARDIOVERSION N/A 10/01/2018   Procedure: CARDIOVERSION;  Surgeon: Donato Heinz, MD;  Location: [place];  Service: Endoscopy;  Laterality: N/A;   PACEMAKER INSERTION  1994; 2002; 05/08/2012   RIGHT/LEFT HEART CATH AND CORONARY ANGIOGRAPHY N/A 06/05/2019   Procedure: RIGHT/LEFT HEART CATH AND CORONARY ANGIOGRAPHY;  Surgeon: Jolaine Artist, MD;  Location: [place] [facility];  Service: Cardiovascular;  Laterality: N/A;   TONSILLECTOMY  ~ [place]?    Current Outpatient Medications  Medication Sig Dispense Refill   acetaminophen (TYLENOL) 500 MG tablet Take 500-1,000 mg by  mouth every 6 (six) hours as needed (for pain).     albuterol (VENTOLIN HFA) 108 (90 Base) MCG/ACT inhaler INHALE 1 - 2 PUFFS INTO THE LUNGS EVERY 6 HOURS AS NEEDED FOR WHEEZING OR SHORTNESS OF BREATH 18 g 6   allopurinol (ZYLOPRIM) 100 MG tablet Take 50 mg by mouth daily as needed (gout flares.).      ALPRAZolam (XANAX) 0.25 MG tablet TAKE 1 TABLET BY MOUTH TWICE DAILY AS NEEDED FOR ANXIETY  30 tablet 0   amitriptyline (ELAVIL) 10 MG tablet Take 10 mg by mouth daily as needed (migraine).      amoxicillin (AMOXIL) 500 MG capsule TAKE 4 CAPSULES BY MOUTH 1 HOUR PRIOR TO DENTAL APPOINTMENT 16 capsule 2   apixaban (ELIQUIS) 5 MG TABS tablet TAKE 1 TABLET BY MOUTH TWICE DAILY 60 tablet 2   budesonide-formoterol (SYMBICORT) 160-4.5 MCG/ACT inhaler INHALE 2 PUFFS INTO THE LUNGS 2 TIMES DAILY 10.2 g 11   colchicine 0.6 MG tablet Take 1 tablet (0.6 mg total) by mouth 2 (two) times daily as needed (for gout flare ups.). 60 tablet 1   dapagliflozin propanediol (FARXIGA) 10 MG TABS tablet TAKE 1 TABLET (10 MG TOTAL) BY MOUTH DAILY BEFORE BREAKFAST. 30 tablet 6   digoxin (LANOXIN) 0.125 MG tablet TAKE 1 TABLET (0.125 MG TOTAL) BY MOUTH DAILY. 30 tablet 6   diphenhydrAMINE (SOMINEX) 25 MG tablet Take 50 mg by mouth at bedtime.     FLUoxetine (PROZAC) 20 MG capsule Take 1 capsule (20 mg total) by mouth daily. 30 capsule 0   fluticasone (FLONASE) 50 MCG/ACT nasal spray Place 1 spray into both nostrils daily. 16 g 2   guaiFENesin (MUCINEX) 600 MG 12 hr tablet Take 600 mg by mouth as needed for cough or to loosen phlegm.      ipratropium-albuterol (DUONEB) 0.5-2.5 (3) MG/3ML SOLN USE 1 VIAL (3ML) BY NEBULIZER EVERY 4 TO 6 HOURS AS NEEDED 270 mL 0   losartan (COZAAR) 25 MG tablet Take 25 mg by mouth daily as needed (elevated blood pressure.).      Melatonin 10 MG TABS Take 10 mg by mouth at bedtime as needed (sleep.).      metolazone (ZAROXOLYN) 2.5 MG tablet Take 1 tablet (2.5 mg total) by mouth 2 (two) times a week. Every Monday and Friday, May take 1 extra tablet as directed 15 tablet 2   montelukast (SINGULAIR) 10 MG tablet TAKE 1 TABLET BY MOUTH EVERY NIGHT AT BEDTIME 60 tablet 2   potassium chloride SA (KLOR-CON) 20 MEQ tablet Take 2 tablets (40 mEq total) by mouth 2 (two) times daily. Also take additional 40 meq when you take Metalozone 120 tablet 6   Spacer/Aero-Holding Chambers (AEROCHAMBER MV)  inhaler Use as instructed with Symbicort 1 each 0   spironolactone (ALDACTONE) 25 MG tablet TAKE 1 TABLET (25 MG TOTAL) BY MOUTH AT BEDTIME. 30 tablet 6   torsemide (DEMADEX) 20 MG tablet TAKE 3 TABLETS BY MOUTH TWO TIMES DAILY 180 tablet 3   No current facility-administered medications for this visit.    Allergies:   Prednisone   Social History: Social History   Socioeconomic History   Marital status: Married    Spouse name: Not on file   Number of children: 2   Years of education: Not on file   Highest education level: Not on file  Occupational History   Occupation: [place]    Employer: [employer]  Tobacco Use   Smoking status: Former  Packs/day: 0.50    Years: 30.00    Pack years: 15.00    Types: Cigarettes    Quit date: 07/12/2018    Years since quitting: 1.9   Smokeless tobacco: Never  Vaping Use   Vaping Use: Never used  Substance and Sexual Activity   Alcohol use: Yes    Alcohol/week: 2.0 standard drinks    Types: 2 Glasses of wine per week   Drug use: No   Sexual activity: Never  Other Topics Concern   Not on file  Social History Narrative   ** Merged History Encounter **       Social Determinants of Health   Financial Resource Strain: Not on file  Food Insecurity: Not on file  Transportation Needs: Not on file  Physical Activity: Not on file  Stress: Not on file  Social Connections: Not on file  Intimate Partner Violence: Not on file    Family History: Family History  Problem Relation Age of Onset   Heart disease Sister    Dementia Mother    Cancer Sister 9       uterine   Diabetes Other    Cardiomyopathy Sister    Diabetes Brother    Colon cancer Neg Hx    Esophageal cancer Neg Hx    Stomach cancer Neg Hx    Rectal cancer Neg Hx     Review of Systems: All other systems reviewed and are otherwise negative except as noted above.   Physical Exam: Vitals:   06/30/20 1246  BP: 90/60  Pulse: 81  SpO2: 97%   Weight: 118 lb 9.6 oz (53.8 kg)  Height: 5' 0.5" (1.537 m)     GEN- The patient is well appearing, alert and oriented x 3 today.   HEENT: normocephalic, atraumatic; sclera clear, conjunctiva pink; hearing intact; oropharynx clear; neck supple, no JVP Lymph- no cervical lymphadenopathy Lungs- Clear to ausculation bilaterally, normal work of breathing.  No wheezes, rales, rhonchi Heart- Regular rate and rhythm, no murmurs, rubs or gallops, PMI not laterally displaced GI- soft, non-tender, non-distended, bowel sounds present, no hepatosplenomegaly Extremities- no clubbing or cyanosis. No edema; DP/PT/radial pulses 2+ bilaterally MS- no significant deformity or atrophy Skin- warm and dry, no rash or lesion; ICD pocket well healed Psych- euthymic mood, full affect Neuro- strength and sensation are intact  ICD interrogation- reviewed in detail today,  See PACEART report  EKG:  EKG is not ordered today.  EKG ordered 04/25/20 showed A sensed BiV paced at 68 bpm with QRS of 214 ms. Likely overestimated, and with overall downward deflection in Lead I and upward deflection in lead V1 indicating LV first, further attempts at A-V/V-V optimization unlikely to be beneficial in her overall picture.   Recent Labs: 07/24/2019: ALT 25; TSH 2.40 02/10/2020: B Natriuretic Peptide 980.8 05/18/2020: Hemoglobin 16.1; Platelets 371.0; Pro B Natriuretic peptide (BNP) 920.0 06/17/2020: BUN 17; Creatinine, Ser 1.00; Potassium 3.2; Sodium 136   Wt Readings from Last 3 Encounters:  06/30/20 118 lb 9.6 oz (53.8 kg)  06/17/20 120 lb (54.4 kg)  05/18/20 117 lb (53.1 kg)     Other studies Reviewed: Additional studies/ records that were reviewed today include: Previous EP and CHF office notes   Assessment and Plan:  1.  Chronic systolic dysfunction s/p St. Jude CRT-D  NYHA IIIb symptoms Volume status stable on exam euvolemic today Stable on an appropriate medical regimen Normal ICD function See Pace Art  report No changes today Her QRS is wide but  negative lead I and positive V1 indicative of LV first pacing. She has had previous attempts at A-V and V-V optimization. Unlikely to benefit by any significant degree from further attempts at device optimization.  Rockwell Alexandria Newborn's heart failure has failed to improve despite titration of guideline directed medication such that he qualifies for the [facility] NEO device. The following information from the patient's medical record supports the medical necessity of this procedure for my patient, consistent with the FDA on-label indication for BAROSTIM NEO:  ? LVEF of 25-30%  confirmed by Echo on 06/17/2020   ? NT-proBNP of <1600 pg/ml  = Labs to be drawn today, 06/30/20  ?Symptomatic despite medication management of: diuretic, ACEi/ARB/ARNi, Aldosterone inhibitor, and SGLT2 inhibitor as evidenced by symptoms below.   ? This patients signs and symptoms of heart failure include "dyspnea with mild to moderate exertion, orthopnea, fatigue, and weakness   ? NYHA Congestive Heart Failure Classification: IIIb  ? Recent hospitalization for Heart Failure on (not applicable for this patient)   This patient has continued CHF symptoms despite CRT device and previous attempts at A-V and V-V optimization.   Current medicines are reviewed at length with the patient today.   The patient does not have concerns regarding her medicines.  The following changes were made today:  none  Labs/ tests ordered today include:  Orders Placed This Encounter  Procedures   Basic metabolic panel   Pro b natriuretic peptide (BNP)   Disposition:   Follow up with Dr. Curt Bears as scheduled. I suspect her Pro-BNP will be elevated beyond the usually accepted range. She has no other options. If decision is made to write letter of medical necessity based on her BNP, will need to involve CHF and EP teams as joint decision.   Jacalyn Lefevre, PA-C  06/30/2020 1:17  PM  [place] [phone] (office) [phone] (fax)

## 2020-07-01 ENCOUNTER — Other Ambulatory Visit: Payer: Self-pay

## 2020-07-01 DIAGNOSIS — I5022 Chronic systolic (congestive) heart failure: Secondary | ICD-10-CM

## 2020-07-01 DIAGNOSIS — I4819 Other persistent atrial fibrillation: Secondary | ICD-10-CM

## 2020-07-01 LAB — PRO B NATRIURETIC PEPTIDE: NT-Pro BNP: 1767 pg/mL — ABNORMAL HIGH (ref 0–301)

## 2020-07-01 MED ORDER — POTASSIUM CHLORIDE CRYS ER 20 MEQ PO TBCR: EXTENDED_RELEASE_TABLET | ORAL | 6 refills | Status: DC

## 2020-07-04 ENCOUNTER — Other Ambulatory Visit (HOSPITAL_BASED_OUTPATIENT_CLINIC_OR_DEPARTMENT_OTHER): Payer: Self-pay

## 2020-07-04 MED FILL — Torsemide Tab 20 MG: ORAL | 30 days supply | Qty: 180 | Fill #2 | Status: AC

## 2020-07-05 ENCOUNTER — Other Ambulatory Visit: Payer: 59

## 2020-07-06 ENCOUNTER — Encounter: Payer: Self-pay | Admitting: *Deleted

## 2020-07-07 ENCOUNTER — Other Ambulatory Visit (HOSPITAL_BASED_OUTPATIENT_CLINIC_OR_DEPARTMENT_OTHER): Payer: Self-pay

## 2020-07-07 MED FILL — Dapagliflozin Propanediol Tab 10 MG (Base Equivalent): ORAL | 30 days supply | Qty: 30 | Fill #2 | Status: AC

## 2020-07-07 MED FILL — Albuterol Sulfate Inhal Aero 108 MCG/ACT (90MCG Base Equiv): RESPIRATORY_TRACT | 30 days supply | Qty: 18 | Fill #1 | Status: AC

## 2020-07-07 NOTE — Telephone Encounter (Signed)
Dr. Halford Chessman, please see and advise on mychart message. Thanks!:  This is no longer on [place] [employer] as a tier 1  drug. It is very expensive. Is there anything else I can take?  I called [facility]. The only inhaler on Tier 1 is Advair Diskus. I use [facility] if this can be ordered, Thanks Angela Shepherd

## 2020-07-08 ENCOUNTER — Other Ambulatory Visit (HOSPITAL_BASED_OUTPATIENT_CLINIC_OR_DEPARTMENT_OTHER): Payer: Self-pay

## 2020-07-08 MED ORDER — FLUTICASONE-SALMETEROL 250-50 MCG/ACT IN AEPB
1.0000 | INHALATION_SPRAY | Freq: Two times a day (BID) | RESPIRATORY_TRACT | 5 refills | Status: AC
  Filled 2020-07-08: qty 60, 30d supply, fill #0
  Filled 2020-09-07: qty 60, 30d supply, fill #1

## 2020-07-12 ENCOUNTER — Other Ambulatory Visit: Payer: Self-pay

## 2020-07-12 ENCOUNTER — Other Ambulatory Visit: Payer: 59

## 2020-07-12 ENCOUNTER — Other Ambulatory Visit (HOSPITAL_BASED_OUTPATIENT_CLINIC_OR_DEPARTMENT_OTHER): Payer: Self-pay

## 2020-07-12 ENCOUNTER — Other Ambulatory Visit (HOSPITAL_COMMUNITY): Payer: Self-pay | Admitting: Internal Medicine

## 2020-07-12 DIAGNOSIS — I5022 Chronic systolic (congestive) heart failure: Secondary | ICD-10-CM | POA: Diagnosis not present

## 2020-07-12 DIAGNOSIS — I4819 Other persistent atrial fibrillation: Secondary | ICD-10-CM | POA: Diagnosis not present

## 2020-07-12 MED ORDER — POTASSIUM CHLORIDE CRYS ER 20 MEQ PO TBCR
40.0000 meq | EXTENDED_RELEASE_TABLET | Freq: Two times a day (BID) | ORAL | 6 refills | Status: DC
  Filled 2020-07-12: qty 120, 30d supply, fill #0

## 2020-07-13 LAB — BASIC METABOLIC PANEL
BUN/Creatinine Ratio: 25 (ref 12–28)
BUN: 35 mg/dL — ABNORMAL HIGH (ref 8–27)
CO2: 32 mmol/L — ABNORMAL HIGH (ref 20–29)
Calcium: 9.7 mg/dL (ref 8.7–10.3)
Chloride: 92 mmol/L — ABNORMAL LOW (ref 96–106)
Creatinine, Ser: 1.41 mg/dL — ABNORMAL HIGH (ref 0.57–1.00)
Glucose: 91 mg/dL (ref 65–99)
Potassium: 3.2 mmol/L — ABNORMAL LOW (ref 3.5–5.2)
Sodium: 141 mmol/L (ref 134–144)
eGFR: 41 mL/min/{1.73_m2} — ABNORMAL LOW (ref >59)

## 2020-07-13 LAB — PRO B NATRIURETIC PEPTIDE: NT-Pro BNP: 2372 pg/mL — ABNORMAL HIGH (ref 0–301)

## 2020-07-14 ENCOUNTER — Other Ambulatory Visit: Payer: Self-pay

## 2020-07-14 ENCOUNTER — Ambulatory Visit (INDEPENDENT_AMBULATORY_CARE_PROVIDER_SITE_OTHER): Payer: 59

## 2020-07-14 DIAGNOSIS — I420 Dilated cardiomyopathy: Secondary | ICD-10-CM

## 2020-07-14 DIAGNOSIS — I5022 Chronic systolic (congestive) heart failure: Secondary | ICD-10-CM

## 2020-07-14 LAB — CUP PACEART REMOTE DEVICE CHECK
Battery Remaining Longevity: 77 mo
Battery Remaining Percentage: 95 %
Battery Voltage: 3.01 V
Brady Statistic AP VP Percent: 28 %
Brady Statistic AP VS Percent: 1 %
Brady Statistic AS VP Percent: 71 %
Brady Statistic AS VS Percent: 1 %
Brady Statistic RA Percent Paced: 27 %
Date Time Interrogation Session: 20220707020013
Implantable Lead Implant Date: 19940705
Implantable Lead Implant Date: 19940705
Implantable Lead Implant Date: 20140501
Implantable Lead Location: 753858
Implantable Lead Location: 753859
Implantable Lead Location: 753860
Implantable Lead Model: 4024
Implantable Lead Model: 4524
Implantable Pulse Generator Implant Date: 20211208
Lead Channel Impedance Value: 360 Ohm
Lead Channel Impedance Value: 490 Ohm
Lead Channel Impedance Value: 600 Ohm
Lead Channel Pacing Threshold Amplitude: 1 V
Lead Channel Pacing Threshold Amplitude: 1.5 V
Lead Channel Pacing Threshold Amplitude: 1.75 V
Lead Channel Pacing Threshold Pulse Width: 0.4 ms
Lead Channel Pacing Threshold Pulse Width: 0.4 ms
Lead Channel Pacing Threshold Pulse Width: 1 ms
Lead Channel Sensing Intrinsic Amplitude: 2.4 mV
Lead Channel Sensing Intrinsic Amplitude: 3.6 mV
Lead Channel Setting Pacing Amplitude: 2 V
Lead Channel Setting Pacing Amplitude: 2.25 V
Lead Channel Setting Pacing Amplitude: 2.5 V
Lead Channel Setting Pacing Pulse Width: 0.4 ms
Lead Channel Setting Pacing Pulse Width: 1 ms
Lead Channel Setting Sensing Sensitivity: 0.7 mV
Pulse Gen Model: 3222
Pulse Gen Serial Number: 3871541

## 2020-07-18 ENCOUNTER — Other Ambulatory Visit (HOSPITAL_BASED_OUTPATIENT_CLINIC_OR_DEPARTMENT_OTHER): Payer: Self-pay

## 2020-07-18 ENCOUNTER — Other Ambulatory Visit: Payer: Self-pay | Admitting: Family Medicine

## 2020-07-18 MED ORDER — ALPRAZOLAM 0.25 MG PO TABS
ORAL_TABLET | Freq: Two times a day (BID) | ORAL | 0 refills | Status: DC | PRN
  Filled 2020-07-18: qty 30, 15d supply, fill #0

## 2020-07-18 MED FILL — Digoxin Tab 125 MCG (0.125 MG): ORAL | 30 days supply | Qty: 30 | Fill #2 | Status: AC

## 2020-07-18 NOTE — Telephone Encounter (Signed)
Last filled 05/11/20 #30 with no refills Last visit 07/24/19 Next visit 08/19/20

## 2020-07-19 ENCOUNTER — Other Ambulatory Visit (HOSPITAL_BASED_OUTPATIENT_CLINIC_OR_DEPARTMENT_OTHER): Payer: Self-pay

## 2020-07-20 ENCOUNTER — Other Ambulatory Visit: Payer: Self-pay

## 2020-07-20 ENCOUNTER — Other Ambulatory Visit: Payer: 59 | Admitting: *Deleted

## 2020-07-20 DIAGNOSIS — I5022 Chronic systolic (congestive) heart failure: Secondary | ICD-10-CM | POA: Diagnosis not present

## 2020-07-20 LAB — BASIC METABOLIC PANEL
BUN/Creatinine Ratio: 19 (ref 12–28)
BUN: 23 mg/dL (ref 8–27)
CO2: 26 mmol/L (ref 20–29)
Calcium: 9.5 mg/dL (ref 8.7–10.3)
Chloride: 97 mmol/L (ref 96–106)
Creatinine, Ser: 1.24 mg/dL — ABNORMAL HIGH (ref 0.57–1.00)
Glucose: 81 mg/dL (ref 65–99)
Potassium: 4 mmol/L (ref 3.5–5.2)
Sodium: 138 mmol/L (ref 134–144)
eGFR: 47 mL/min/{1.73_m2} — ABNORMAL LOW (ref >59)

## 2020-07-21 ENCOUNTER — Other Ambulatory Visit: Payer: 59

## 2020-07-25 ENCOUNTER — Other Ambulatory Visit (HOSPITAL_BASED_OUTPATIENT_CLINIC_OR_DEPARTMENT_OTHER): Payer: Self-pay

## 2020-07-25 MED FILL — Montelukast Sodium Tab 10 MG (Base Equiv): ORAL | 30 days supply | Qty: 60 | Fill #1 | Status: AC

## 2020-07-28 ENCOUNTER — Other Ambulatory Visit: Payer: Self-pay | Admitting: Student

## 2020-07-28 DIAGNOSIS — Z01818 Encounter for other preprocedural examination: Secondary | ICD-10-CM

## 2020-07-28 NOTE — Progress Notes (Signed)
  For Barostim consideration.   Should only be scheduled with Dr. Trula Slade on a day that he also has an appointment with the patient.

## 2020-08-01 ENCOUNTER — Other Ambulatory Visit: Payer: Self-pay

## 2020-08-01 DIAGNOSIS — I5022 Chronic systolic (congestive) heart failure: Secondary | ICD-10-CM

## 2020-08-02 ENCOUNTER — Other Ambulatory Visit: Payer: Self-pay | Admitting: Family Medicine

## 2020-08-02 ENCOUNTER — Other Ambulatory Visit (HOSPITAL_BASED_OUTPATIENT_CLINIC_OR_DEPARTMENT_OTHER): Payer: Self-pay

## 2020-08-02 DIAGNOSIS — F32A Depression, unspecified: Secondary | ICD-10-CM

## 2020-08-02 DIAGNOSIS — F419 Anxiety disorder, unspecified: Secondary | ICD-10-CM

## 2020-08-02 MED ORDER — FLUOXETINE HCL 20 MG PO CAPS
20.0000 mg | ORAL_CAPSULE | Freq: Every day | ORAL | 0 refills | Status: DC
  Filled 2020-08-02: qty 30, 30d supply, fill #0

## 2020-08-02 MED FILL — Dapagliflozin Propanediol Tab 10 MG (Base Equivalent): ORAL | 30 days supply | Qty: 30 | Fill #3 | Status: AC

## 2020-08-03 ENCOUNTER — Other Ambulatory Visit (HOSPITAL_COMMUNITY): Payer: Self-pay | Admitting: Internal Medicine

## 2020-08-03 ENCOUNTER — Other Ambulatory Visit (HOSPITAL_BASED_OUTPATIENT_CLINIC_OR_DEPARTMENT_OTHER): Payer: Self-pay

## 2020-08-03 MED ORDER — TORSEMIDE 20 MG PO TABS
ORAL_TABLET | ORAL | 3 refills | Status: AC
  Filled 2020-08-03: qty 180, 30d supply, fill #0
  Filled 2020-09-05: qty 180, 30d supply, fill #1

## 2020-08-03 MED ORDER — APIXABAN 5 MG PO TABS
ORAL_TABLET | Freq: Two times a day (BID) | ORAL | 3 refills | Status: AC
  Filled 2020-08-03: qty 60, 30d supply, fill #0

## 2020-08-04 ENCOUNTER — Other Ambulatory Visit (HOSPITAL_BASED_OUTPATIENT_CLINIC_OR_DEPARTMENT_OTHER): Payer: Self-pay

## 2020-08-05 NOTE — Progress Notes (Signed)
Remote pacemaker transmission.   

## 2020-08-08 ENCOUNTER — Encounter (HOSPITAL_COMMUNITY): Payer: Self-pay

## 2020-08-08 NOTE — Progress Notes (Signed)
Angela Shepherd - 70 y.o. female MRN [id number]  Date of birth: 1950/09/21  SUBJECTIVE:  Including CC & ROS.  No chief complaint on file.   Angela Shepherd is a 70 y.o. female that is presenting with acute right foot pain.  The pain has been ongoing for the past 3 weeks.  Is having redness and swelling in the whole foot itself.  It is similar to previous gout flares.  Not currently taking allopurinol.    Review of Systems See HPI   HISTORY: Past Medical, Surgical, Social, and Family History Reviewed & Updated per EMR.   Pertinent Historical Findings include:  Past Medical History:  Diagnosis Date   Bradycardia    a. initial PPM placed 1994 at [facility] with gen change 2002. b. 1*AVB & intermittent 2nd degree AVB + CHF --> upgrade to [facility] Jude BiV PPM 05/08/12.   Chronic systolic CHF (congestive heart failure) ([place])    a. EF 30-35% dx in 2011 -> most recent 40-45% by echo 04/2012. b. s/p upgrade to BiV-PPM 05/08/12. c. Med titration limited by hypotension.   Cluster headache syndrome    Hypertr obst cardiomyop    Hypokalemia    Hypotension    Paroxysmal atrial fibrillation ([place])    a. identified on PPM interrogation b. longest episode 1 hour; if burden increases, will need [place]    Pneumonia 2008   Sleep apnea     Past Surgical History:  Procedure Laterality Date   ATRIAL FIBRILLATION ABLATION N/A 11/15/2017   Procedure: ATRIAL FIBRILLATION ABLATION;  Surgeon: Constance Haw, MD;  Location: [place] [facility];  Service: Cardiovascular;  Laterality: N/A;   ATRIAL FIBRILLATION ABLATION N/A 12/26/2018   Procedure: ATRIAL FIBRILLATION ABLATION;  Surgeon: Constance Haw, MD;  Location: [place] [facility];  Service: Cardiovascular;  Laterality: N/A;   BI-VENTRICULAR PACEMAKER UPGRADE N/A 05/08/2012   upgrade to SJM Anthem (CRT-P) by Dr Rayann Heman   BIV PACEMAKER GENERATOR CHANGEOUT N/A 12/16/2019   Procedure: BIV PACEMAKER GENERATOR CHANGEOUT;  Surgeon: Constance Haw,  MD;  Location: [place] [facility];  Service: Cardiovascular;  Laterality: N/A;   CARDIOVERSION N/A 09/19/2017   Procedure: CARDIOVERSION;  Surgeon: Sanda Klein, MD;  Location: [place] [facility];  Service: Cardiovascular;  Laterality: N/A;   CARDIOVERSION N/A 10/01/2018   Procedure: CARDIOVERSION;  Surgeon: Donato Heinz, MD;  Location: [place];  Service: Endoscopy;  Laterality: N/A;   PACEMAKER INSERTION  1994; 2002; 05/08/2012   RIGHT/LEFT HEART CATH AND CORONARY ANGIOGRAPHY N/A 06/05/2019   Procedure: RIGHT/LEFT HEART CATH AND CORONARY ANGIOGRAPHY;  Surgeon: Jolaine Artist, MD;  Location: [place] [facility];  Service: Cardiovascular;  Laterality: N/A;   TONSILLECTOMY  ~ [place]?    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    Employer: [employer]  Tobacco Use   Smoking status: Former    Packs/day: 0.50    Years: 30.00    Pack years: 15.00    Types: Cigarettes    Quit date: 07/12/2018    Years since quitting: 2.0   Smokeless tobacco: Never  Vaping Use   Vaping Use: Never used  Substance and Sexual Activity   Alcohol use: Yes    Alcohol/week: 2.0 standard drinks    Types: 2 Glasses of wine per week   Drug use: No   Sexual activity: Never  Other Topics Concern   Not on file  Social History Narrative   ** Merged History Encounter **       Social Determinants of Health   Financial Resource Strain: Not on file  Food Insecurity: Not on  file  Transportation Needs: Not on file  Physical Activity: Not on file  Stress: Not on file  Social Connections: Not on file  Intimate Partner Violence: Not on file     PHYSICAL EXAM:  VS: BP 102/70 (BP Location: Left Arm, Patient Position: Sitting, Cuff Size: Normal)   Ht 5' 1.5" (1.562 m)   Wt 115 lb (52.2 kg)   LMP  (LMP Unknown)   BMI 21.38 kg/m  Physical Exam Gen: NAD, alert, cooperative with exam, well-appearing MSK:  Right foot: Red and swelling. No streaking evident. Normal strength resistance. Neurovascular intact     ASSESSMENT & PLAN:   Acute gout due to renal impairment involving right foot Symptoms most consistent with a gout flare.  Has a history of similar flare.  She has had an increase in the amount of diuretic that she is taking which could be contributing to her symptoms -Counseled on home exercise therapy and supportive care. -Colchicine. -Uric acid. -Can restart allopurinol going forward.

## 2020-08-09 ENCOUNTER — Encounter: Payer: Self-pay | Admitting: Family Medicine

## 2020-08-09 ENCOUNTER — Ambulatory Visit (INDEPENDENT_AMBULATORY_CARE_PROVIDER_SITE_OTHER): Payer: 59 | Admitting: Family Medicine

## 2020-08-09 ENCOUNTER — Other Ambulatory Visit (HOSPITAL_BASED_OUTPATIENT_CLINIC_OR_DEPARTMENT_OTHER): Payer: Self-pay

## 2020-08-09 ENCOUNTER — Other Ambulatory Visit: Payer: Self-pay

## 2020-08-09 VITALS — BP 102/70 | Ht 61.5 in | Wt 115.0 lb

## 2020-08-09 DIAGNOSIS — M10371 Gout due to renal impairment, right ankle and foot: Secondary | ICD-10-CM | POA: Diagnosis not present

## 2020-08-09 MED ORDER — COLCHICINE 0.6 MG PO TABS
0.6000 mg | ORAL_TABLET | Freq: Two times a day (BID) | ORAL | 2 refills | Status: AC
  Filled 2020-08-09: qty 60, 30d supply, fill #0

## 2020-08-09 NOTE — Assessment & Plan Note (Signed)
Symptoms most consistent with a gout flare.  Has a history of similar flare.  She has had an increase in the amount of diuretic that she is taking which could be contributing to her symptoms -Counseled on home exercise therapy and supportive care. -Colchicine. -Uric acid. -Can restart allopurinol going forward.

## 2020-08-09 NOTE — Patient Instructions (Signed)
Nice to meet you Please take the colchicine until 2 days after your last symptom  I will call once I have the results.   Please send me a message in MyChart with any questions or updates.  Please see me back in 2 weeks.   --Dr. Raeford Razor

## 2020-08-10 DIAGNOSIS — M10371 Gout due to renal impairment, right ankle and foot: Secondary | ICD-10-CM | POA: Diagnosis not present

## 2020-08-11 LAB — URIC ACID: Uric Acid: 12.2 mg/dL — ABNORMAL HIGH (ref 3.0–7.2)

## 2020-08-12 ENCOUNTER — Encounter: Payer: 59 | Admitting: Family Medicine

## 2020-08-12 ENCOUNTER — Telehealth: Payer: Self-pay | Admitting: Family Medicine

## 2020-08-12 ENCOUNTER — Encounter: Payer: Self-pay | Admitting: Family Medicine

## 2020-08-12 ENCOUNTER — Other Ambulatory Visit (HOSPITAL_BASED_OUTPATIENT_CLINIC_OR_DEPARTMENT_OTHER): Payer: Self-pay

## 2020-08-12 MED ORDER — ALLOPURINOL 100 MG PO TABS
100.0000 mg | ORAL_TABLET | Freq: Every day | ORAL | 1 refills | Status: AC
  Filled 2020-08-12: qty 30, 30d supply, fill #0

## 2020-08-12 MED ORDER — PREDNISONE 5 MG PO TABS
ORAL_TABLET | ORAL | 0 refills | Status: AC
  Filled 2020-08-12: qty 21, 6d supply, fill #0

## 2020-08-12 NOTE — Telephone Encounter (Signed)
Informed of results. Sent in allopurinol and prednisone.   Rosemarie Ax, MD [facility] 08/12/2020, 1:56 PM

## 2020-08-12 NOTE — Telephone Encounter (Signed)
Pt informed of below.  She wants a prednisone rx also.

## 2020-08-12 NOTE — Telephone Encounter (Signed)
Left VM for patient. If she calls back please have her speak with a nurse/CMA and inform that her uric acid is elevated.  We will need to start some allopurinol.  Sometimes we have to start some prednisone we start the allopurinol to decrease the chance of a gout flare.   If any questions then please take the best time and phone number to call and I will try to call her back.   Rosemarie Ax, MD [facility] 08/12/2020, 9:06 AM

## 2020-08-15 ENCOUNTER — Other Ambulatory Visit (HOSPITAL_BASED_OUTPATIENT_CLINIC_OR_DEPARTMENT_OTHER): Payer: Self-pay

## 2020-08-15 ENCOUNTER — Other Ambulatory Visit (HOSPITAL_COMMUNITY): Payer: Self-pay | Admitting: Internal Medicine

## 2020-08-15 MED ORDER — POTASSIUM CHLORIDE CRYS ER 20 MEQ PO TBCR
40.0000 meq | EXTENDED_RELEASE_TABLET | Freq: Two times a day (BID) | ORAL | 6 refills | Status: DC
  Filled 2020-08-15: qty 120, 30d supply, fill #0

## 2020-08-17 ENCOUNTER — Telehealth (HOSPITAL_COMMUNITY): Payer: Self-pay | Admitting: *Deleted

## 2020-08-17 ENCOUNTER — Other Ambulatory Visit: Payer: Self-pay

## 2020-08-17 ENCOUNTER — Encounter (HOSPITAL_COMMUNITY): Payer: Self-pay | Admitting: Internal Medicine

## 2020-08-17 ENCOUNTER — Other Ambulatory Visit (HOSPITAL_BASED_OUTPATIENT_CLINIC_OR_DEPARTMENT_OTHER): Payer: Self-pay

## 2020-08-17 ENCOUNTER — Ambulatory Visit (HOSPITAL_COMMUNITY)
Admission: RE | Admit: 2020-08-17 | Discharge: 2020-08-17 | Disposition: A | Discharge status: Home / Self Care | Payer: 59 | Source: Ambulatory Visit | Attending: Internal Medicine | Admitting: Internal Medicine

## 2020-08-17 VITALS — BP 90/50 | HR 64 | Wt 113.0 lb

## 2020-08-17 DIAGNOSIS — G4733 Obstructive sleep apnea (adult) (pediatric): Secondary | ICD-10-CM | POA: Diagnosis not present

## 2020-08-17 DIAGNOSIS — E876 Hypokalemia: Secondary | ICD-10-CM

## 2020-08-17 DIAGNOSIS — I5042 Chronic combined systolic (congestive) and diastolic (congestive) heart failure: Secondary | ICD-10-CM

## 2020-08-17 DIAGNOSIS — I480 Paroxysmal atrial fibrillation: Secondary | ICD-10-CM

## 2020-08-17 LAB — BASIC METABOLIC PANEL
Anion gap: 16 — ABNORMAL HIGH (ref 5–15)
BUN: 30 mg/dL — ABNORMAL HIGH (ref 8–23)
CO2: 26 mmol/L (ref 22–32)
Calcium: 9.6 mg/dL (ref 8.9–10.3)
Chloride: 90 mmol/L — ABNORMAL LOW (ref 98–111)
Creatinine, Ser: 1.27 mg/dL — ABNORMAL HIGH (ref 0.44–1.00)
GFR, Estimated: 46 mL/min — ABNORMAL LOW (ref >60)
Glucose, Bld: 115 mg/dL — ABNORMAL HIGH (ref 70–99)
Potassium: 2.6 mmol/L — CL (ref 3.5–5.1)
Sodium: 132 mmol/L — ABNORMAL LOW (ref 135–145)

## 2020-08-17 LAB — DIGOXIN LEVEL: Digoxin Level: 0.8 ng/mL (ref 0.8–2.0)

## 2020-08-17 MED ORDER — POTASSIUM CHLORIDE CRYS ER 20 MEQ PO TBCR
80.0000 meq | EXTENDED_RELEASE_TABLET | Freq: Two times a day (BID) | ORAL | 6 refills | Status: AC
  Filled 2020-08-17 – 2020-09-07 (×2): qty 240, 30d supply, fill #0

## 2020-08-17 NOTE — Patient Instructions (Addendum)
Labs done today, your results will be available in MyChart, we will contact you for abnormal readings.  Your physician recommends that you schedule a follow-up appointment in: 3-4 months  If you have any questions or concerns before your next appointment please send [place] a message through [place] or call our office at [phone].    TO LEAVE A MESSAGE FOR THE NURSE SELECT OPTION 2, PLEASE LEAVE A MESSAGE INCLUDING: YOUR NAME DATE OF BIRTH CALL BACK NUMBER REASON FOR CALL**this is important as we prioritize the call backs  YOU WILL RECEIVE A CALL BACK THE SAME DAY AS LONG AS YOU CALL BEFORE 4:00 PM  milAt the [facility], you and your health needs are our priority. As part of our continuing mission to provide you with exceptional heart care, we have created designated Provider Care Teams. These Care Teams include your primary Cardiologist (physician) and Advanced Practice Providers (APPs- Physician Assistants and Nurse Practitioners) who all work together to provide you with the care you need, when you need it.   You may see any of the following providers on your designated Care Team at your next follow up: Dr Glori Bickers Dr Loralie Champagne Dr Patrice Paradise, NP Lyda Jester, [place] Ginnie Smart Audry Riles, PharmD   Please be sure to bring in all your medications bottles to every appointment.

## 2020-08-17 NOTE — Telephone Encounter (Signed)
CRITICAL VALUE STICKER  CRITICAL VALUE: K 2.6  MD NOTIFIED: Dr Haroldine Laws  RESPONSE: Take extra 120 meq of KCL today then increase to 80 meq Twice daily starting tomorrow, extra 20 meq with Metolazone, repeat labs Mon 8/15  Spoke w/pt and husband, they are both aware, agreeable, and verbalized understanding. New RX for KCL sent in, lab order placed, pt will see if she can go to [place] for labs Mon if not she will come here.

## 2020-08-17 NOTE — Progress Notes (Signed)
Advanced HF Clinic Note   Date:  08/17/2020   ID:  Angela Shepherd, DOB [birth year], MRN [id number]  Location: Home  Provider location: [place] [facility] Type of Visit: Established patient  PCP:  Midge Minium, MD  Cardiologist:  None Primary HF: Angela Shepherd  Chief Complaint: Heart Failure follow-up   History of Present Illness:  Angela Shepherd is a 70 y.o. 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 She has a h/o HOCM, systolic HF due to NICM s/p CRT-D, severe COPD and PAF s/p ablation x 2.   Was admitted in December 2011 with acute HF. Echo with EF 30-35%, There was also evidence of ASH with septum of 1.7 PW = 1.9.Cath 12/2009: Normal coronaries. EF 40-45%.     Was seen by EP and noted to have a high percentage of RV pacing (99%) and it was felt this was worsening LV function. Underwent upgrade to [facility]. Jude CRT-P in 5/14.     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 No LVOT obstruction clinically.   Admitted in 6/19 for syncope with SBP 71/59. Arlyce Harman and losartan stopped. Echo with EF 35-40%  She also has atrial fibrillation and had AF ablation x 2 with Dr. Curt Bears 11/18/2017. & 12/26/18.  Admitted in [facility] in 5/21 with acute respiratory failure and delirium.Intubated. With lactic acid 5.3. Found to have R-sided PNA and pulmory edema. Treated with abx and IV lasix. Tikosyn stopped. Echo EF 20-25%.   I last saw her 10/21. Volume up. Torsemide 60 bid restarted. Also started metolazone 2.5 given for prn use.   Echo 12/21 EF 25-30% RV mild HK.   Echo 06/17/20 EF 25% moderate RV dysfunction Personally reviewed  Here for routine f/u with her husband. Feeling much better since taking metolazone twice a week. Still some SOB as the day goes on and  atigue with activity but overall much improved. Has been turned down for VAD due to lung disease but says she wouldn't want VAD anyway. Being evaluated for  Barostim placement if BNP not too high,   Cardiac studies Echo in March 2012:  EF 50-55%. Grade 2 diastolic dysfunction. IVS 1.4cm.  Echo 4/14: EF 40-45%  Echo 4/16: EF 45-50% Echo 6/19 EF 35-40% Mild LVH no LVOT obstruction  Echo 05/2019 EF 20-25% at [facility] Echo 06/17/20 EF 25% moderate RV dysfunction  PFTs 6/22 FEV1 1.03L FVC 1.67 DLCO 51%  Cath 06/05/19 No CAD. EF 20% Ao = 92/60 LV =  89/19 RA = 10 RV = 68/12 PA = 64/25 (39) PCW = 23 Fick cardiac output/index = 2.5/1.6 PVR = 6.5 WU Ao sat = 99% PA sat = 57%, 58% PAPi = 3.9   PFTs 7/21 FEV1 0.88L FVC 1.56 DLCO 41%  Past Medical History:  Diagnosis Date   Bradycardia    a. initial PPM placed 1994 at [facility] with gen change 2002. b. 1*AVB & intermittent 2nd degree AVB + CHF --> upgrade to [facility]. Jude BiV PPM 05/08/12.   Chronic systolic CHF (congestive heart failure) ([place])    a. EF 30-35% dx in 2011 -> most recent 40-45% by echo 04/2012. b. s/p upgrade to BiV-PPM 05/08/12. c. Med titration limited by hypotension.   Cluster headache syndrome    Hypertr obst cardiomyop    Hypokalemia    Hypotension    Paroxysmal atrial fibrillation (HCC)    a. identified on PPM interrogation b. longest episode 1  hour; if burden increases, will need [place]    Pneumonia 2008   Sleep apnea    Past Surgical History:  Procedure Laterality Date   ATRIAL FIBRILLATION ABLATION N/A 11/15/2017   Procedure: ATRIAL FIBRILLATION ABLATION;  Surgeon: Constance Haw, MD;  Location: [place] [facility];  Service: Cardiovascular;  Laterality: N/A;   ATRIAL FIBRILLATION ABLATION N/A 12/26/2018   Procedure: ATRIAL FIBRILLATION ABLATION;  Surgeon: Constance Haw, MD;  Location: [place] [facility];  Service: Cardiovascular;  Laterality: N/A;   BI-VENTRICULAR PACEMAKER UPGRADE N/A 05/08/2012   upgrade to [facility] (CRT-P) by Dr Rayann Heman   BIV PACEMAKER GENERATOR CHANGEOUT N/A 12/16/2019   Procedure: BIV PACEMAKER GENERATOR CHANGEOUT;  Surgeon:  Constance Haw, MD;  Location: [place] [facility];  Service: Cardiovascular;  Laterality: N/A;   CARDIOVERSION N/A 09/19/2017   Procedure: CARDIOVERSION;  Surgeon: Sanda Klein, MD;  Location: [place] [facility];  Service: Cardiovascular;  Laterality: N/A;   CARDIOVERSION N/A 10/01/2018   Procedure: CARDIOVERSION;  Surgeon: Donato Heinz, MD;  Location: [place];  Service: Endoscopy;  Laterality: N/A;   PACEMAKER INSERTION  1994; 2002; 05/08/2012   RIGHT/LEFT HEART CATH AND CORONARY ANGIOGRAPHY N/A 06/05/2019   Procedure: RIGHT/LEFT HEART CATH AND CORONARY ANGIOGRAPHY;  Surgeon: Jolaine Artist, MD;  Location: [place] [facility];  Service: Cardiovascular;  Laterality: N/A;   TONSILLECTOMY  ~ [place]?     Current Outpatient Medications  Medication Sig Dispense Refill   acetaminophen (TYLENOL) 500 MG tablet Take 500-1,000 mg by mouth every 6 (six) hours as needed (for pain).     albuterol (VENTOLIN HFA) 108 (90 Base) MCG/ACT inhaler INHALE 1 - 2 PUFFS INTO THE LUNGS EVERY 6 HOURS AS NEEDED FOR WHEEZING OR SHORTNESS OF BREATH 18 g 6   allopurinol (ZYLOPRIM) 100 MG tablet Take 1 tablet (100 mg total) by mouth daily. 30 tablet 1   ALPRAZolam (XANAX) 0.25 MG tablet Take 1 tablet by mouth 2 (two) times daily as needed. 30 tablet 0   amitriptyline (ELAVIL) 10 MG tablet Take 10 mg by mouth daily as needed (migraine).      amoxicillin (AMOXIL) 500 MG capsule TAKE 4 CAPSULES BY MOUTH 1 HOUR PRIOR TO DENTAL APPOINTMENT 16 capsule 2   apixaban (ELIQUIS) 5 MG TABS tablet TAKE 1 TABLET BY MOUTH TWICE DAILY 60 tablet 3   budesonide-formoterol (SYMBICORT) 160-4.5 MCG/ACT inhaler INHALE 2 PUFFS INTO THE LUNGS 2 TIMES DAILY 10.2 g 11   colchicine 0.6 MG tablet Take 1 tablet (0.6 mg total) by mouth 2 (two) times daily. 60 tablet 2   dapagliflozin propanediol (FARXIGA) 10 MG TABS tablet TAKE 1 TABLET (10 MG TOTAL) BY MOUTH DAILY BEFORE BREAKFAST. 30 tablet 6   digoxin  (LANOXIN) 0.125 MG tablet TAKE 1 TABLET (0.125 MG TOTAL) BY MOUTH DAILY. 30 tablet 6   diphenhydrAMINE (SOMINEX) 25 MG tablet Take 50 mg by mouth at bedtime.     FLUoxetine (PROZAC) 20 MG capsule Take 1 capsule (20 mg total) by mouth daily. 30 capsule 0   fluticasone (FLONASE) 50 MCG/ACT nasal spray Place 1 spray into both nostrils daily. 16 g 2   fluticasone-salmeterol (ADVAIR) 250-50 MCG/ACT AEPB Inhale 1 puff into the lungs in the morning and at bedtime. 60 each 5   guaiFENesin (MUCINEX) 600 MG 12 hr tablet Take 600 mg by mouth as needed for cough or to loosen phlegm.      ipratropium-albuterol (DUONEB) 0.5-2.5 (3) MG/3ML SOLN  USE 1 VIAL (3ML) BY NEBULIZER EVERY 4 TO 6 HOURS AS NEEDED 270 mL 0   losartan (COZAAR) 25 MG tablet Take 25 mg by mouth daily as needed (elevated blood pressure.).      Melatonin 10 MG TABS Take 10 mg by mouth at bedtime as needed (sleep.).      metolazone (ZAROXOLYN) 2.5 MG tablet Take 1 tablet (2.5 mg total) by mouth 2 (two) times a week. Every Monday and Friday, May take 1 extra tablet as directed 15 tablet 2   montelukast (SINGULAIR) 10 MG tablet TAKE 1 TABLET BY MOUTH EVERY NIGHT AT BEDTIME 60 tablet 2   potassium chloride SA (KLOR-CON) 20 MEQ tablet Take 60 meq in the AM and 40 meq in the PM daily. 120 tablet 6   potassium chloride SA (KLOR-CON) 20 MEQ tablet Take 2 tablets (40 mEq total) by mouth 2 (two) times daily. Also take additional 40 meq when you take Metalozone 120 tablet 6   predniSONE (DELTASONE) 5 MG tablet Take 6 tablets by mouth on day 1, then 5 tablets on day 2, then 4 tablets on day 3, then 3 tablets on day 4, then 2 tablets on day 5, then 1 tablet on day 6 21 tablet 0   Spacer/Aero-Holding Chambers (AEROCHAMBER MV) inhaler Use as instructed with Symbicort 1 each 0   spironolactone (ALDACTONE) 25 MG tablet TAKE 1 TABLET (25 MG TOTAL) BY MOUTH AT BEDTIME. 30 tablet 6   torsemide (DEMADEX) 20 MG tablet TAKE 3 TABLETS BY MOUTH TWO TIMES DAILY 180 tablet 3    No current facility-administered medications for this visit.    Allergies:   Prednisone   Social History:  The patient  reports that she quit smoking about 2 years ago. Her smoking use included cigarettes. She has a 15.00 pack-year smoking history. She has never used smokeless tobacco. She reports current alcohol use of about 2.0 standard drinks of alcohol per week. She reports that she does not use drugs.   Family History:  The patient's family history includes Cancer (age of onset: 22) in her sister; Cardiomyopathy in her sister; Dementia in her mother; Diabetes in her brother and another family member; Heart disease in her sister.   ROS:  Please see the history of present illness.   All other systems are personally reviewed and negative.   There were no vitals filed for this visit.  Exam:   General:  Well appearing. No resp difficulty HEENT: normal Neck: supple. no JVD. Carotids 2+ bilat; no bruits. No lymphadenopathy or thryomegaly appreciated. Cor: PMI nondisplaced. Regular rate & rhythm. No rubs, gallops or murmurs. Lungs: clear but decreased throughout Abdomen: soft, nontender, nondistended. No hepatosplenomegaly. No bruits or masses. Good bowel sounds. Extremities: no cyanosis, clubbing, rash, edema Neuro: alert & orientedx3, cranial nerves grossly intact. moves all 4 extremities w/o difficulty. Affect pleasant   ICD interrogated: No VT/AF. Normal function. 99% bivpaced. Personally reviewed   Recent Labs: 02/10/2020: B Natriuretic Peptide 980.8 05/18/2020: Hemoglobin 16.1; Platelets 371.0 07/12/2020: NT-Pro BNP 2,372 07/20/2020: BUN 23; Creatinine, Ser 1.24; Potassium 4.0; Sodium 138  Personally reviewed   Wt Readings from Last 3 Encounters:  08/09/20 52.2 kg (115 lb)  06/30/20 53.8 kg (118 lb 9.6 oz)  06/17/20 54.4 kg (120 lb)    ASSESSMENT AND PLAN:  1. Chronic systolic CHF: EF [id number] s/p CRT-P upgrade 05/2012 (due to chronic RV pacing)  - Echo 6/19: LVEF 35-40%, mild  MR, Mild TR - [place] in [place]  5/21 EF 20-25% - Cath 5/21. No CAD. EF 20% CI 1.6 - Echo 12/21 EF 25-30% RV mild HK  - Echo 06/17/20 EF 25% moderate RV dysfunction Personally reviewed - Improved. NYHA III  Volume status improved with taking metolazone 2x/week - Continue torsemide 60 mg bid. - Continue metolazone 2.5 mg on Mondays and Fridays  - Off losartan with low BP. - Continue spiro 25 daily.  - Continue Farxiga 10 mg daily.  - Continue digoxin 0.'125mg'$  daily. - Bisoprolol stopped due to resp failure and low output. - Have reviewed in VAD MRB but lung disease is prohibitive. - We discussed referral to another academic center for LVAD consideration. She says she does not want a heart pump and as long as her fluid is controlled, with the possibility of feeling better with the Barsostim, she is OK. - Continue with Barostim eval  - Labs today.  2. H/o HOCM  - No LVOT gradient. - Has [facility]. All 1st degree relatives have been tested. - No change  3. Tobacco use/COPD - Says she has been abstinent since 7/20. - PFTs as above. Slightly improved off after stopping smoking but prohibitive for advanced therapies. - No change.  4. Persistent AF/AFL - Follows with Dr. Curt Bears.  - Remains in NSR. Conti nue AC  5. CKD 3a - Continue Farxiga. - Follows with Dr. Joylene Grapes - Labs today.  Glori Bickers, MD  11:57 AM   [facility] [place] [address] [facility] [place] [address] [phone] (office) [phone] (fax)

## 2020-08-18 ENCOUNTER — Other Ambulatory Visit (HOSPITAL_BASED_OUTPATIENT_CLINIC_OR_DEPARTMENT_OTHER): Payer: Self-pay

## 2020-08-18 MED FILL — Digoxin Tab 125 MCG (0.125 MG): ORAL | 30 days supply | Qty: 30 | Fill #3 | Status: AC

## 2020-08-19 ENCOUNTER — Encounter: Payer: Self-pay | Admitting: Registered Nurse

## 2020-08-19 ENCOUNTER — Ambulatory Visit (INDEPENDENT_AMBULATORY_CARE_PROVIDER_SITE_OTHER): Payer: 59 | Admitting: Registered Nurse

## 2020-08-19 ENCOUNTER — Other Ambulatory Visit (HOSPITAL_BASED_OUTPATIENT_CLINIC_OR_DEPARTMENT_OTHER): Payer: Self-pay

## 2020-08-19 ENCOUNTER — Other Ambulatory Visit: Payer: Self-pay

## 2020-08-19 VITALS — HR 62 | Temp 98.0°F | Resp 17 | Ht 61.5 in | Wt 118.6 lb

## 2020-08-19 DIAGNOSIS — Z0001 Encounter for general adult medical examination with abnormal findings: Secondary | ICD-10-CM | POA: Diagnosis not present

## 2020-08-19 DIAGNOSIS — Z72820 Sleep deprivation: Secondary | ICD-10-CM | POA: Diagnosis not present

## 2020-08-19 MED ORDER — TRAZODONE HCL 50 MG PO TABS
25.0000 mg | ORAL_TABLET | Freq: Every evening | ORAL | 3 refills | Status: AC | PRN
  Filled 2020-08-19: qty 30, 30d supply, fill #0
  Filled 2020-09-07 – 2020-09-12 (×2): qty 30, 30d supply, fill #1

## 2020-08-19 NOTE — Patient Instructions (Addendum)
Ms. Angela Shepherd to meet you  Exam reassuring - no new findings  Let me know if you need med refills  Placing future orders for labs today  Trazodone - take 0.5-1 tablet about 1 hour before bed. Let me know if not working or working too well.  Will follow up on labs based on results.  Will CC Dr. Birdie Riddle and Dr. Haroldine Laws  Thank you  Denice Paradise     If you have lab work done today you will be contacted with your lab results within the next 2 weeks.  If you have not heard from [place] then please contact us. The fastest way to get your results is to register for My Chart.   IF you received an x-ray today, you will receive an invoice from [facility]. Please contact [facility] at [phone] with questions or concerns regarding your invoice.   IF you received labwork today, you will receive an invoice from [facility]. Please contact [facility] at [phone] with questions or concerns regarding your invoice.   Our billing staff will not be able to assist you with questions regarding bills from these companies.  You will be contacted with the lab results as soon as they are available. The fastest way to get your results is to activate your My Chart account. Instructions are located on the last page of this paperwork. If you have not heard from [place] regarding the results in 2 weeks, please contact this office.

## 2020-08-19 NOTE — Progress Notes (Signed)
Established Patient Office Visit  Subjective:  Patient ID: Angela Shepherd, female    DOB: Jan 14, 1950  Age: 70 y.o. MRN: [id number]  CC:  Chief Complaint  Patient presents with   Annual Exam    Patient states she is here for CPE. Per patient she will like to discuss night sweats and she wakes up and her hair is soaked in the morning 3-4     HPI Angela Shepherd presents for CPE  Notes concern of night sweats Hx of OSA, chf, copd, hocm  Of note, recent visit with Dr. Haroldine Laws  Unfortunately her cardiac health is in decline and her options are limited. She is having some trouble facing this, causing more anxiety and contributing to insomnia.  No acute concerns. Past Medical History:  Diagnosis Date   Bradycardia    a. initial PPM placed 1994 at [facility] with gen change 2002. b. 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 Jude BiV PPM 05/08/12.   Chronic systolic CHF (congestive heart failure) ([place])    a. EF 30-35% dx in 2011 -> most recent 40-45% by echo 04/2012. b. s/p upgrade to BiV-PPM 05/08/12. c. Med titration limited by hypotension.   Cluster headache syndrome    Hypertr obst cardiomyop    Hypokalemia    Hypotension    Paroxysmal atrial fibrillation ([place])    a. identified on PPM interrogation b. longest episode 1 hour; if burden increases, will need [place]    Pneumonia 2008   Sleep apnea     Past Surgical History:  Procedure Laterality Date   ATRIAL FIBRILLATION ABLATION N/A 11/15/2017   Procedure: ATRIAL FIBRILLATION ABLATION;  Surgeon: Constance Haw, MD;  Location: [place] [facility];  Service: Cardiovascular;  Laterality: N/A;   ATRIAL FIBRILLATION ABLATION N/A 12/26/2018   Procedure: ATRIAL FIBRILLATION ABLATION;  Surgeon: Constance Haw, MD;  Location: [place] [facility];  Service: Cardiovascular;  Laterality: N/A;   BI-VENTRICULAR PACEMAKER UPGRADE N/A 05/08/2012   upgrade to SJM Anthem (CRT-P) by Dr Rayann Heman   BIV PACEMAKER  GENERATOR CHANGEOUT N/A 12/16/2019   Procedure: BIV PACEMAKER GENERATOR CHANGEOUT;  Surgeon: Constance Haw, MD;  Location: [place] [facility];  Service: Cardiovascular;  Laterality: N/A;   CARDIOVERSION N/A 09/19/2017   Procedure: CARDIOVERSION;  Surgeon: Sanda Klein, MD;  Location: [place] [facility];  Service: Cardiovascular;  Laterality: N/A;   CARDIOVERSION N/A 10/01/2018   Procedure: CARDIOVERSION;  Surgeon: Donato Heinz, MD;  Location: [place];  Service: Endoscopy;  Laterality: N/A;   PACEMAKER INSERTION  1994; 2002; 05/08/2012   RIGHT/LEFT HEART CATH AND CORONARY ANGIOGRAPHY N/A 06/05/2019   Procedure: RIGHT/LEFT HEART CATH AND CORONARY ANGIOGRAPHY;  Surgeon: Jolaine Artist, MD;  Location: [place] [facility];  Service: Cardiovascular;  Laterality: N/A;   TONSILLECTOMY  ~ [place]?    Family History  Problem Relation Age of Onset   Heart disease Sister    Dementia Mother    Cancer Sister 22       uterine   Diabetes Other    Cardiomyopathy Sister    Diabetes Brother    Colon cancer Neg Hx    Esophageal cancer Neg Hx    Stomach cancer Neg Hx    Rectal cancer Neg Hx     Social History   Socioeconomic History   Marital status: Married    Spouse name: Not on file   Number of children: 2  Years of education: Not on file   Highest education level: Not on file  Occupational History   Occupation: [employer]    Employer: [employer]  Tobacco Use   Smoking status: Former    Packs/day: 0.50    Years: 30.00    Pack years: 15.00    Types: Cigarettes    Quit date: 07/12/2018    Years since quitting: 2.1   Smokeless tobacco: Never  Vaping Use   Vaping Use: Never used  Substance and Sexual Activity   Alcohol use: Yes    Alcohol/week: 2.0 standard drinks    Types: 2 Glasses of wine per week   Drug use: No   Sexual activity: Never  Other Topics Concern   Not on file  Social History Narrative   ** Merged History  Encounter **       Social Determinants of Health   Financial Resource Strain: Not on file  Food Insecurity: Not on file  Transportation Needs: Not on file  Physical Activity: Not on file  Stress: Not on file  Social Connections: Not on file  Intimate Partner Violence: Not on file    Outpatient Medications Prior to Visit  Medication Sig Dispense Refill   acetaminophen (TYLENOL) 500 MG tablet Take 500-1,000 mg by mouth every 6 (six) hours as needed (for pain).     albuterol (VENTOLIN HFA) 108 (90 Base) MCG/ACT inhaler INHALE 1 - 2 PUFFS INTO THE LUNGS EVERY 6 HOURS AS NEEDED FOR WHEEZING OR SHORTNESS OF BREATH 18 g 6   allopurinol (ZYLOPRIM) 100 MG tablet Take 1 tablet (100 mg total) by mouth daily. 30 tablet 1   ALPRAZolam (XANAX) 0.25 MG tablet Take 1 tablet by mouth 2 (two) times daily as needed. 30 tablet 0   amitriptyline (ELAVIL) 10 MG tablet Take 10 mg by mouth daily as needed (migraine).      amoxicillin (AMOXIL) 500 MG capsule TAKE 4 CAPSULES BY MOUTH 1 HOUR PRIOR TO DENTAL APPOINTMENT 16 capsule 2   apixaban (ELIQUIS) 5 MG TABS tablet TAKE 1 TABLET BY MOUTH TWICE DAILY 60 tablet 3   colchicine 0.6 MG tablet Take 1 tablet (0.6 mg total) by mouth 2 (two) times daily. 60 tablet 2   dapagliflozin propanediol (FARXIGA) 10 MG TABS tablet TAKE 1 TABLET (10 MG TOTAL) BY MOUTH DAILY BEFORE BREAKFAST. 30 tablet 6   digoxin (LANOXIN) 0.125 MG tablet TAKE 1 TABLET (0.125 MG TOTAL) BY MOUTH DAILY. 30 tablet 6   diphenhydrAMINE (SOMINEX) 25 MG tablet Take 50 mg by mouth at bedtime.     FLUoxetine (PROZAC) 20 MG capsule Take 1 capsule (20 mg total) by mouth daily. 30 capsule 0   fluticasone (FLONASE) 50 MCG/ACT nasal spray Place 1 spray into both nostrils daily. 16 g 2   fluticasone-salmeterol (ADVAIR) 250-50 MCG/ACT AEPB Inhale 1 puff into the lungs in the morning and at bedtime. 60 each 5   guaiFENesin (MUCINEX) 600 MG 12 hr tablet Take 600 mg by mouth as needed for cough or to loosen  phlegm.      ipratropium-albuterol (DUONEB) 0.5-2.5 (3) MG/3ML SOLN USE 1 VIAL (3ML) BY NEBULIZER EVERY 4 TO 6 HOURS AS NEEDED 270 mL 0   losartan (COZAAR) 25 MG tablet Take 25 mg by mouth daily as needed (elevated blood pressure.).      Melatonin 10 MG TABS Take 10 mg by mouth at bedtime as needed (sleep.).      metolazone (ZAROXOLYN) 2.5 MG tablet Take 1  tablet (2.5 mg total) by mouth 2 (two) times a week. Every Monday and Friday, May take 1 extra tablet as directed 15 tablet 2   montelukast (SINGULAIR) 10 MG tablet TAKE 1 TABLET BY MOUTH EVERY NIGHT AT BEDTIME 60 tablet 2   potassium chloride SA (KLOR-CON) 20 MEQ tablet Take 4 tablets (80 mEq total) by mouth 2 (two) times daily. Take an extra tab with Metolazone 240 tablet 6   predniSONE (DELTASONE) 5 MG tablet Take 6 tablets by mouth on day 1, then 5 tablets on day 2, then 4 tablets on day 3, then 3 tablets on day 4, then 2 tablets on day 5, then 1 tablet on day 6 21 tablet 0   Spacer/Aero-Holding Chambers (AEROCHAMBER MV) inhaler Use as instructed with Symbicort 1 each 0   spironolactone (ALDACTONE) 25 MG tablet TAKE 1 TABLET (25 MG TOTAL) BY MOUTH AT BEDTIME. 30 tablet 6   torsemide (DEMADEX) 20 MG tablet TAKE 3 TABLETS BY MOUTH TWO TIMES DAILY 180 tablet 3   No facility-administered medications prior to visit.    Allergies  Allergen Reactions   Prednisone Shortness Of Breath    CHF     ROS Review of Systems  Constitutional: Negative.   HENT: Negative.    Eyes: Negative.   Respiratory: Negative.    Cardiovascular: Negative.   Gastrointestinal: Negative.   Genitourinary: Negative.   Musculoskeletal: Negative.   Skin: Negative.   Neurological: Negative.   Psychiatric/Behavioral:  Positive for sleep disturbance. The patient is nervous/anxious.   All other systems reviewed and are negative.    Objective:    Physical Exam Vitals and nursing note reviewed.  Constitutional:      General: She is not in acute distress.     Appearance: Normal appearance. She is normal weight. She is not ill-appearing, toxic-appearing or diaphoretic.  HENT:     Head: Normocephalic and atraumatic.     Right Ear: Tympanic membrane, ear canal and external ear normal. There is no impacted cerumen.     Left Ear: Tympanic membrane, ear canal and external ear normal. There is no impacted cerumen.     Nose: Nose normal. No congestion or rhinorrhea.     Mouth/Throat:     Mouth: Mucous membranes are moist.     Pharynx: Oropharynx is clear. No oropharyngeal exudate or posterior oropharyngeal erythema.  Eyes:     General: No scleral icterus.       Right eye: No discharge.        Left eye: No discharge.     Extraocular Movements: Extraocular movements intact.     Conjunctiva/sclera: Conjunctivae normal.     Pupils: Pupils are equal, round, and reactive to light.  Cardiovascular:     Rate and Rhythm: Normal rate and regular rhythm.     Pulses: Normal pulses.     Heart sounds: No murmur heard.   No friction rub. No gallop.     Comments: Distant heart sounds. Pulmonary:     Effort: Pulmonary effort is normal. No respiratory distress.     Breath sounds: Normal breath sounds. No stridor. No wheezing, rhonchi or rales.  Chest:     Chest wall: No tenderness.  Abdominal:     General: Abdomen is flat. Bowel sounds are normal. There is no distension.     Palpations: Abdomen is soft. There is no mass.     Tenderness: There is no abdominal tenderness. There is no right CVA tenderness, left CVA tenderness, guarding or rebound.  Hernia: No hernia is present.  Musculoskeletal:        General: No swelling, tenderness, deformity or signs of injury. Normal range of motion.     Right lower leg: No edema.     Left lower leg: No edema.  Skin:    General: Skin is warm and dry.     Capillary Refill: Capillary refill takes less than 2 seconds.     Coloration: Skin is not jaundiced or pale.     Findings: No bruising, erythema, lesion or rash.   Neurological:     General: No focal deficit present.     Mental Status: She is alert and oriented to person, place, and time. Mental status is at baseline.     Cranial Nerves: No cranial nerve deficit.     Sensory: No sensory deficit.     Motor: No weakness.     Coordination: Coordination normal.     Gait: Gait normal.     Deep Tendon Reflexes: Reflexes normal.  Psychiatric:        Mood and Affect: Mood normal.        Behavior: Behavior normal.        Thought Content: Thought content normal.        Judgment: Judgment normal.    Pulse 62   Temp 98 F (36.7 C) (Temporal)   Resp 17   Ht 5' 1.5" (1.562 m)   Wt 118 lb 9.6 oz (53.8 kg)   LMP  (LMP Unknown)   SpO2 99%   BMI 22.05 kg/m  Wt Readings from Last 3 Encounters:  08/19/20 118 lb 9.6 oz (53.8 kg)  08/17/20 113 lb (51.3 kg)  08/09/20 115 lb (52.2 kg)     Health Maintenance Due  Topic Date Due   INFLUENZA VACCINE  08/08/2020    There are no preventive care reminders to display for this patient.  Lab Results  Component Value Date   TSH 2.40 07/24/2019   Lab Results  Component Value Date   WBC 10.5 05/18/2020   HGB 16.1 (H) 05/18/2020   HCT 47.3 (H) 05/18/2020   MCV 91.9 05/18/2020   PLT 371.0 05/18/2020   Lab Results  Component Value Date   NA 132 (L) 08/17/2020   K 2.6 (LL) 08/17/2020   CO2 26 08/17/2020   GLUCOSE 115 (H) 08/17/2020   BUN 30 (H) 08/17/2020   CREATININE 1.27 (H) 08/17/2020   BILITOT 0.9 07/24/2019   ALKPHOS 90 02/13/2018   AST 29 07/24/2019   ALT 25 07/24/2019   PROT 7.4 07/24/2019   ALBUMIN 4.2 02/13/2018   CALCIUM 9.6 08/17/2020   ANIONGAP 16 (H) 08/17/2020   EGFR 47 (L) 07/20/2020   GFR 37.99 (L) 05/18/2020   Lab Results  Component Value Date   CHOL 211 (H) 07/24/2019   Lab Results  Component Value Date   HDL 66 07/24/2019   Lab Results  Component Value Date   LDLCALC 121 (H) 07/24/2019   Lab Results  Component Value Date   TRIG 125 07/24/2019   Lab Results   Component Value Date   CHOLHDL 3.2 07/24/2019   Lab Results  Component Value Date   HGBA1C 5.5 07/15/2018      Assessment & Plan:   Problem List Items Addressed This Visit   None   No orders of the defined types were placed in this encounter.   Follow-up: No follow-ups on file.   PLAN Fairly benign exam. Expected findings for chf. Will try trazodone  for sleep disturbance. Reviewed risks, benefits, alternatives. Pt voices understanding. Reviewed last labs with patient Spent time discussing her cardiac diagnoses, goals of care, and expectations. Patient encouraged to call clinic with any questions, comments, or concerns.  Maximiano Coss, NP

## 2020-08-22 ENCOUNTER — Encounter (HOSPITAL_COMMUNITY): Payer: Self-pay

## 2020-08-23 ENCOUNTER — Encounter: Payer: Self-pay | Admitting: Family Medicine

## 2020-08-23 ENCOUNTER — Other Ambulatory Visit: Payer: Self-pay

## 2020-08-23 ENCOUNTER — Ambulatory Visit (INDEPENDENT_AMBULATORY_CARE_PROVIDER_SITE_OTHER): Payer: 59 | Admitting: Family Medicine

## 2020-08-23 DIAGNOSIS — M10371 Gout due to renal impairment, right ankle and foot: Secondary | ICD-10-CM | POA: Diagnosis not present

## 2020-08-23 NOTE — Progress Notes (Signed)
Angela Shepherd - 70 y.o. female MRN [id number]  Date of birth: 06-03-1950  SUBJECTIVE:  Including CC & ROS.  No chief complaint on file.   Angela Shepherd is a 70 y.o. female that is following up for her right foot pain.  The pain has significantly improved since starting the colchicine.  She is also started allopurinol.   Review of Systems See HPI   HISTORY: Past Medical, Surgical, Social, and Family History Reviewed & Updated per EMR.   Pertinent Historical Findings include:  Past Medical History:  Diagnosis Date   Bradycardia    a. initial PPM placed 1994 at [facility] with gen change 2002. b. 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 Jude BiV PPM 05/08/12.   Chronic systolic CHF (congestive heart failure) ([place])    a. EF 30-35% dx in 2011 -> most recent 40-45% by echo 04/2012. b. s/p upgrade to BiV-PPM 05/08/12. c. Med titration limited by hypotension.   Cluster headache syndrome    Hypertr obst cardiomyop    Hypokalemia    Hypotension    Paroxysmal atrial fibrillation ([place])    a. identified on PPM interrogation b. longest episode 1 hour; if burden increases, will need [place]    Pneumonia 2008   Sleep apnea     Past Surgical History:  Procedure Laterality Date   ATRIAL FIBRILLATION ABLATION N/A 11/15/2017   Procedure: ATRIAL FIBRILLATION ABLATION;  Surgeon: Constance Haw, MD;  Location: [place] [facility];  Service: Cardiovascular;  Laterality: N/A;   ATRIAL FIBRILLATION ABLATION N/A 12/26/2018   Procedure: ATRIAL FIBRILLATION ABLATION;  Surgeon: Constance Haw, MD;  Location: [place] [facility];  Service: Cardiovascular;  Laterality: N/A;   BI-VENTRICULAR PACEMAKER UPGRADE N/A 05/08/2012   upgrade to SJM Anthem (CRT-P) by Dr Rayann Heman   BIV PACEMAKER GENERATOR CHANGEOUT N/A 12/16/2019   Procedure: BIV PACEMAKER GENERATOR CHANGEOUT;  Surgeon: Constance Haw, MD;  Location: [place] [facility];  Service: Cardiovascular;  Laterality: N/A;    CARDIOVERSION N/A 09/19/2017   Procedure: CARDIOVERSION;  Surgeon: Sanda Klein, MD;  Location: [place] [facility];  Service: Cardiovascular;  Laterality: N/A;   CARDIOVERSION N/A 10/01/2018   Procedure: CARDIOVERSION;  Surgeon: Donato Heinz, MD;  Location: [place];  Service: Endoscopy;  Laterality: N/A;   PACEMAKER INSERTION  1994; 2002; 05/08/2012   RIGHT/LEFT HEART CATH AND CORONARY ANGIOGRAPHY N/A 06/05/2019   Procedure: RIGHT/LEFT HEART CATH AND CORONARY ANGIOGRAPHY;  Surgeon: Jolaine Artist, MD;  Location: [place] [facility];  Service: Cardiovascular;  Laterality: N/A;   TONSILLECTOMY  ~ [place]?    Family History  Problem Relation Age of Onset   Heart disease Sister    Dementia Mother    Cancer Sister 41       uterine   Diabetes Other    Cardiomyopathy Sister    Diabetes Brother    Colon cancer Neg Hx    Esophageal cancer Neg Hx    Stomach cancer Neg Hx    Rectal cancer Neg Hx     Social History   Socioeconomic History   Marital status: Married    Spouse name: Not on file   Number of children: 2   Years of education: Not on file   Highest education level: Not on file  Occupational History   Occupation: [employer]    Employer: [employer]  Tobacco Use   Smoking status: Former  Packs/day: 0.50    Years: 30.00    Pack years: 15.00    Types: Cigarettes    Quit date: 07/12/2018    Years since quitting: 2.1   Smokeless tobacco: Never  Vaping Use   Vaping Use: Never used  Substance and Sexual Activity   Alcohol use: Yes    Alcohol/week: 2.0 standard drinks    Types: 2 Glasses of wine per week   Drug use: No   Sexual activity: Never  Other Topics Concern   Not on file  Social History Narrative   ** Merged History Encounter **       Social Determinants of Health   Financial Resource Strain: Not on file  Food Insecurity: Not on file  Transportation Needs: Not on file  Physical Activity: Not on file  Stress:  Not on file  Social Connections: Not on file  Intimate Partner Violence: Not on file     PHYSICAL EXAM:  VS: BP (!) 110/52 (BP Location: Left Arm, Patient Position: Sitting, Cuff Size: Normal)   Ht 5' 1.5" (1.562 m)   Wt 118 lb (53.5 kg)   LMP  (LMP Unknown)   BMI 21.93 kg/m  Physical Exam Gen: NAD, alert, cooperative with exam, well-appearing    ASSESSMENT & PLAN:   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 -Counseled on home exercise therapy and supportive care. -Counseled on colchicine. -Has had allopurinol initiated. -Repeat uric acid in a few months.

## 2020-08-24 ENCOUNTER — Ambulatory Visit (HOSPITAL_COMMUNITY)
Admission: RE | Admit: 2020-08-24 | Discharge: 2020-08-24 | Disposition: A | Discharge status: Home / Self Care | Payer: 59 | Source: Ambulatory Visit | Attending: Cardiology | Admitting: Cardiology

## 2020-08-24 DIAGNOSIS — E876 Hypokalemia: Secondary | ICD-10-CM | POA: Diagnosis not present

## 2020-08-24 DIAGNOSIS — I5042 Chronic combined systolic (congestive) and diastolic (congestive) heart failure: Secondary | ICD-10-CM | POA: Insufficient documentation

## 2020-08-24 LAB — BASIC METABOLIC PANEL
Anion gap: 13 (ref 5–15)
BUN: 26 mg/dL — ABNORMAL HIGH (ref 8–23)
CO2: 29 mmol/L (ref 22–32)
Calcium: 9.2 mg/dL (ref 8.9–10.3)
Chloride: 93 mmol/L — ABNORMAL LOW (ref 98–111)
Creatinine, Ser: 1.18 mg/dL — ABNORMAL HIGH (ref 0.44–1.00)
GFR, Estimated: 50 mL/min — ABNORMAL LOW (ref >60)
Glucose, Bld: 109 mg/dL — ABNORMAL HIGH (ref 70–99)
Potassium: 3.3 mmol/L — ABNORMAL LOW (ref 3.5–5.1)
Sodium: 135 mmol/L (ref 135–145)

## 2020-08-24 NOTE — Assessment & Plan Note (Signed)
Resolution of her symptoms. -Counseled on home exercise therapy and supportive care. -Counseled on colchicine. -Has had allopurinol initiated. -Repeat uric acid in a few months.

## 2020-08-31 ENCOUNTER — Other Ambulatory Visit: Payer: Self-pay | Admitting: Family Medicine

## 2020-08-31 ENCOUNTER — Other Ambulatory Visit (HOSPITAL_BASED_OUTPATIENT_CLINIC_OR_DEPARTMENT_OTHER): Payer: Self-pay

## 2020-08-31 DIAGNOSIS — F419 Anxiety disorder, unspecified: Secondary | ICD-10-CM

## 2020-08-31 DIAGNOSIS — F32A Depression, unspecified: Secondary | ICD-10-CM

## 2020-08-31 MED ORDER — FLUOXETINE HCL 20 MG PO CAPS
20.0000 mg | ORAL_CAPSULE | Freq: Every day | ORAL | 0 refills | Status: AC
  Filled 2020-08-31: qty 30, 30d supply, fill #0

## 2020-08-31 MED FILL — Dapagliflozin Propanediol Tab 10 MG (Base Equivalent): ORAL | 30 days supply | Qty: 30 | Fill #4 | Status: AC

## 2020-09-05 ENCOUNTER — Encounter (HOSPITAL_COMMUNITY): Payer: Self-pay

## 2020-09-05 ENCOUNTER — Other Ambulatory Visit (HOSPITAL_BASED_OUTPATIENT_CLINIC_OR_DEPARTMENT_OTHER): Payer: Self-pay

## 2020-09-07 ENCOUNTER — Other Ambulatory Visit (HOSPITAL_COMMUNITY): Payer: 59

## 2020-09-07 ENCOUNTER — Other Ambulatory Visit (HOSPITAL_BASED_OUTPATIENT_CLINIC_OR_DEPARTMENT_OTHER): Payer: Self-pay

## 2020-09-07 MED FILL — Albuterol Sulfate Inhal Aero 108 MCG/ACT (90MCG Base Equiv): RESPIRATORY_TRACT | 30 days supply | Qty: 18 | Fill #2 | Status: AC

## 2020-09-08 ENCOUNTER — Telehealth: Payer: Self-pay

## 2020-09-08 ENCOUNTER — Other Ambulatory Visit (HOSPITAL_BASED_OUTPATIENT_CLINIC_OR_DEPARTMENT_OTHER): Payer: Self-pay

## 2020-09-08 ENCOUNTER — Other Ambulatory Visit: Payer: Self-pay | Admitting: Family

## 2020-09-08 ENCOUNTER — Other Ambulatory Visit: Payer: Self-pay | Admitting: Family Medicine

## 2020-09-08 MED ORDER — ALPRAZOLAM 0.25 MG PO TABS
ORAL_TABLET | Freq: Two times a day (BID) | ORAL | 0 refills | Status: AC | PRN
  Filled 2020-09-08: qty 30, 15d supply, fill #0

## 2020-09-08 NOTE — Telephone Encounter (Signed)
Sent request to [employer].

## 2020-09-08 NOTE — Telephone Encounter (Signed)
LOV 08/19/2020 with Richard. No follow up scheduled as of yet.  Please escribe Xanax ([email or web address])

## 2020-09-09 ENCOUNTER — Other Ambulatory Visit (HOSPITAL_BASED_OUTPATIENT_CLINIC_OR_DEPARTMENT_OTHER): Payer: Self-pay

## 2020-09-13 ENCOUNTER — Other Ambulatory Visit (HOSPITAL_BASED_OUTPATIENT_CLINIC_OR_DEPARTMENT_OTHER): Payer: Self-pay

## 2020-09-19 ENCOUNTER — Ambulatory Visit (INDEPENDENT_AMBULATORY_CARE_PROVIDER_SITE_OTHER): Payer: 59 | Admitting: Surgery

## 2020-09-19 ENCOUNTER — Encounter: Payer: Self-pay | Admitting: Surgery

## 2020-09-19 ENCOUNTER — Other Ambulatory Visit: Payer: Self-pay

## 2020-09-19 ENCOUNTER — Other Ambulatory Visit (HOSPITAL_COMMUNITY): Payer: Self-pay | Admitting: Cardiology

## 2020-09-19 ENCOUNTER — Other Ambulatory Visit (HOSPITAL_COMMUNITY): Payer: 59

## 2020-09-19 ENCOUNTER — Other Ambulatory Visit (HOSPITAL_COMMUNITY): Payer: Self-pay | Admitting: Internal Medicine

## 2020-09-19 ENCOUNTER — Ambulatory Visit (INDEPENDENT_AMBULATORY_CARE_PROVIDER_SITE_OTHER)
Admission: RE | Admit: 2020-09-19 | Discharge: 2020-09-19 | Disposition: A | Discharge status: Home / Self Care | Payer: 59 | Source: Ambulatory Visit | Attending: Surgery | Admitting: Surgery

## 2020-09-19 ENCOUNTER — Ambulatory Visit (HOSPITAL_COMMUNITY)
Admission: RE | Admit: 2020-09-19 | Discharge: 2020-09-19 | Disposition: A | Discharge status: Home / Self Care | Payer: 59 | Source: Ambulatory Visit | Attending: Internal Medicine | Admitting: Internal Medicine

## 2020-09-19 ENCOUNTER — Other Ambulatory Visit (HOSPITAL_COMMUNITY): Payer: Self-pay | Admitting: Surgery

## 2020-09-19 VITALS — BP 101/66 | HR 70 | Temp 97.8°F | Resp 20 | Ht 61.5 in | Wt 114.0 lb

## 2020-09-19 DIAGNOSIS — I10 Essential (primary) hypertension: Secondary | ICD-10-CM | POA: Diagnosis not present

## 2020-09-19 DIAGNOSIS — I6523 Occlusion and stenosis of bilateral carotid arteries: Secondary | ICD-10-CM | POA: Insufficient documentation

## 2020-09-19 DIAGNOSIS — I779 Disorder of arteries and arterioles, unspecified: Secondary | ICD-10-CM | POA: Insufficient documentation

## 2020-09-19 DIAGNOSIS — I5022 Chronic systolic (congestive) heart failure: Secondary | ICD-10-CM | POA: Diagnosis not present

## 2020-09-19 DIAGNOSIS — Z01818 Encounter for other preprocedural examination: Secondary | ICD-10-CM | POA: Diagnosis present

## 2020-09-19 DIAGNOSIS — Z01810 Encounter for preprocedural cardiovascular examination: Secondary | ICD-10-CM | POA: Insufficient documentation

## 2020-09-19 LAB — BASIC METABOLIC PANEL
Anion gap: 14 (ref 5–15)
BUN: 19 mg/dL (ref 8–23)
CO2: 30 mmol/L (ref 22–32)
Calcium: 8.8 mg/dL — ABNORMAL LOW (ref 8.9–10.3)
Chloride: 91 mmol/L — ABNORMAL LOW (ref 98–111)
Creatinine, Ser: 1.54 mg/dL — ABNORMAL HIGH (ref 0.44–1.00)
GFR, Estimated: 37 mL/min — ABNORMAL LOW (ref >60)
Glucose, Bld: 81 mg/dL (ref 70–99)
Potassium: 3.9 mmol/L (ref 3.5–5.1)
Sodium: 135 mmol/L (ref 135–145)

## 2020-09-19 NOTE — Progress Notes (Signed)
[facility]  Patient name: Angela Shepherd MRN: [id number] DOB: Mar 22, 1950 Sex: female   REQUESTING PROVIDER:    Oda Kilts    REASON FOR CONSULT:    Eval for Barostim  HISTORY OF PRESENT ILLNESS:   Angela Shepherd is a 70 y.o. female, who is here today for evaluation of a Barostim device implant.  The patient is under the care of Dr. Haroldine Laws.  The patient's most recent echo from June 2022 shows an ejection fraction of 25-30% despite goal-directed medical therapy.  Patient states that she has good days and bad days and on her bad days she can barely do anything secondary to fatigue.  She has an ICD in place.  She suffers from paroxysmal atrial fibrillation, for which she takes Eliquis.  She is medically managed for hypertension.  She is a former smoker.  PAST MEDICAL HISTORY    Past Medical History:  Diagnosis Date   Bradycardia    a. initial PPM placed 1994 at [facility] with gen change 2002. b. 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 BiV PPM 05/08/12.   Chronic systolic CHF (congestive heart failure) ([place])    a. EF 30-35% dx in 2011 -> most recent 40-45% by echo 04/2012. b. s/p upgrade to BiV-PPM 05/08/12. c. Med titration limited by hypotension.   Cluster headache syndrome    Hypertr obst cardiomyop    Hypokalemia    Hypotension    Paroxysmal atrial fibrillation (HCC)    a. identified on PPM interrogation b. longest episode 1 hour; if burden increases, will need [place]    Pneumonia 2008   Sleep apnea      FAMILY HISTORY   Family History  Problem Relation Age of Onset   Heart disease Sister    Dementia Mother    Cancer Sister 82       uterine   Diabetes Other    Cardiomyopathy Sister    Diabetes Brother    Colon cancer Neg Hx    Esophageal cancer Neg Hx    Stomach cancer Neg Hx    Rectal cancer Neg Hx     SOCIAL HISTORY:   Social History   Socioeconomic History    Marital status: Married    Spouse name: Not on file   Number of children: 2   Years of education: Not on file   Highest education level: Not on file  Occupational History   Occupation: [employer]    Employer: [employer]  Tobacco Use   Smoking status: Former    Packs/day: 0.50    Years: 30.00    Pack years: 15.00    Types: Cigarettes    Quit date: 07/12/2018    Years since quitting: 2.1   Smokeless tobacco: Never  Vaping Use   Vaping Use: Never used  Substance and Sexual Activity   Alcohol use: Yes    Alcohol/week: 2.0 standard drinks    Types: 2 Glasses of wine per week   Drug use: No   Sexual activity: Never  Other Topics Concern   Not on file  Social History Narrative   ** Merged History Encounter **       Social Determinants of Health   Financial Resource Strain: Not on file  Food Insecurity: Not on file  Transportation Needs: Not on file  Physical Activity: Not on file  Stress: Not on file  Social Connections: Not on file  Intimate Partner  Violence: Not on file    ALLERGIES:    No Known Allergies  CURRENT MEDICATIONS:    Current Outpatient Medications  Medication Sig Dispense Refill   acetaminophen (TYLENOL) 500 MG tablet Take 500-1,000 mg by mouth every 6 (six) hours as needed (for pain).     albuterol (VENTOLIN HFA) 108 (90 Base) MCG/ACT inhaler INHALE 1 - 2 PUFFS INTO THE LUNGS EVERY 6 HOURS AS NEEDED FOR WHEEZING OR SHORTNESS OF BREATH 18 g 6   allopurinol (ZYLOPRIM) 100 MG tablet Take 1 tablet (100 mg total) by mouth daily. 30 tablet 1   ALPRAZolam (XANAX) 0.25 MG tablet Take 1 tablet by mouth 2 (two) times daily as needed. 30 tablet 0   amitriptyline (ELAVIL) 10 MG tablet Take 10 mg by mouth daily as needed (migraine).      amoxicillin (AMOXIL) 500 MG capsule TAKE 4 CAPSULES BY MOUTH 1 HOUR PRIOR TO DENTAL APPOINTMENT 16 capsule 2   apixaban (ELIQUIS) 5 MG TABS tablet TAKE 1 TABLET BY MOUTH TWICE DAILY 60 tablet 3   colchicine 0.6  MG tablet Take 1 tablet (0.6 mg total) by mouth 2 (two) times daily. 60 tablet 2   dapagliflozin propanediol (FARXIGA) 10 MG TABS tablet TAKE 1 TABLET (10 MG TOTAL) BY MOUTH DAILY BEFORE BREAKFAST. 30 tablet 6   digoxin (LANOXIN) 0.125 MG tablet TAKE 1 TABLET (0.125 MG TOTAL) BY MOUTH DAILY. 30 tablet 6   diphenhydrAMINE (SOMINEX) 25 MG tablet Take 50 mg by mouth at bedtime.     FLUoxetine (PROZAC) 20 MG capsule Take 1 capsule (20 mg total) by mouth daily. 30 capsule 0   fluticasone (FLONASE) 50 MCG/ACT nasal spray Place 1 spray into both nostrils daily. 16 g 2   fluticasone-salmeterol (ADVAIR) 250-50 MCG/ACT AEPB Inhale 1 puff into the lungs in the morning and at bedtime. 60 each 5   guaiFENesin (MUCINEX) 600 MG 12 hr tablet Take 600 mg by mouth as needed for cough or to loosen phlegm.      ipratropium-albuterol (DUONEB) 0.5-2.5 (3) MG/3ML SOLN USE 1 VIAL (3ML) BY NEBULIZER EVERY 4 TO 6 HOURS AS NEEDED 270 mL 0   losartan (COZAAR) 25 MG tablet Take 25 mg by mouth daily as needed (elevated blood pressure.).      Melatonin 10 MG TABS Take 10 mg by mouth at bedtime as needed (sleep.).      metolazone (ZAROXOLYN) 2.5 MG tablet Take 1 tablet (2.5 mg total) by mouth 2 (two) times a week. Every Monday and Friday, May take 1 extra tablet as directed 15 tablet 2   montelukast (SINGULAIR) 10 MG tablet TAKE 1 TABLET BY MOUTH EVERY NIGHT AT BEDTIME 60 tablet 2   potassium chloride SA (KLOR-CON) 20 MEQ tablet Take 4 tablets (80 mEq total) by mouth 2 (two) times daily. Take an extra tab with Metolazone 240 tablet 6   predniSONE (DELTASONE) 5 MG tablet Take 6 tablets by mouth on day 1, then 5 tablets on day 2, then 4 tablets on day 3, then 3 tablets on day 4, then 2 tablets on day 5, then 1 tablet on day 6 21 tablet 0   Spacer/Aero-Holding Chambers (AEROCHAMBER MV) inhaler Use as instructed with Symbicort 1 each 0   spironolactone (ALDACTONE) 25 MG tablet TAKE 1 TABLET (25 MG TOTAL) BY MOUTH AT BEDTIME. 30 tablet  6   torsemide (DEMADEX) 20 MG tablet TAKE 3 TABLETS BY MOUTH TWO TIMES DAILY 180 tablet 3   traZODone (DESYREL) 50 MG tablet  Take one-half to one (1/2 to 1) tablet (25-50 mg total) by mouth at bedtime as needed for sleep. 30 tablet 3   No current facility-administered medications for this visit.    REVIEW OF SYSTEMS:   '[X]'$  denotes positive finding, '[ ]'$  denotes negative finding Cardiac  Comments:  Chest pain or chest pressure:    Shortness of breath upon exertion: x   Short of breath when lying flat: x   Irregular heart rhythm: x       Vascular    Pain in calf, thigh, or hip brought on by ambulation:    Pain in feet at night that wakes you up from your sleep:     Blood clot in your veins:    Leg swelling:         Pulmonary    Oxygen at home:    Productive cough:     Wheezing:         Neurologic    Sudden weakness in arms or legs:     Sudden numbness in arms or legs:     Sudden onset of difficulty speaking or slurred speech:    Temporary loss of vision in one eye:     Problems with dizziness:         Gastrointestinal    Blood in stool:      Vomited blood:         Genitourinary    Burning when urinating:     Blood in urine:        Psychiatric    Major depression:         Hematologic    Bleeding problems:    Problems with blood clotting too easily:        Skin    Rashes or ulcers:        Constitutional    Fever or chills:     PHYSICAL EXAM:   Vitals:   09/19/20 1501  BP: 101/66  Pulse: 70  Resp: 20  Temp: 97.8 F (36.6 C)  SpO2: 96%  Weight: 114 lb (51.7 kg)  Height: 5' 1.5" (1.562 m)    GENERAL: The patient is a well-nourished female, in no acute distress. The vital signs are documented above. CARDIAC: There is a regular rate and rhythm.  VASCULAR: SonoSite was used to evaluate the carotid bifurcation which is accessible.  There is mild plaquing within the common carotid on the right. PULMONARY: Nonlabored respirations MUSCULOSKELETAL: There are  no major deformities or cyanosis. NEUROLOGIC: No focal weakness or paresthesias are detected. SKIN: There are no ulcers or rashes noted. PSYCHIATRIC: The patient has a normal affect.  STUDIES:   I have reviewed her ultrasound with the following findings: Right Carotid: Velocities in the right ICA are consistent with a 1-39%  stenosis.   Left Carotid: Velocities in the left ICA are consistent with a 1-39%  stenosis.   Vertebrals:  Bilateral vertebral arteries demonstrate antegrade flow.  Subclavians: Normal flow hemodynamics were seen in bilateral subclavian               arteries.   ASSESSMENT and PLAN   NYHA class III heart failure: The patient is here today with her husband.  I discussed the details of device implant for the Barostim Neo.  All questions were answered.  Based on my review of her data I think she would be an excellent candidate.  She will need to stop her Eliquis prior to surgery.  Surgery will be scheduled once we  have insurance approval.   Leia Alf, MD, FACS [facility] [phone] Pager [phone]

## 2020-09-22 ENCOUNTER — Other Ambulatory Visit (HOSPITAL_BASED_OUTPATIENT_CLINIC_OR_DEPARTMENT_OTHER): Payer: Self-pay

## 2020-09-22 MED FILL — Spironolactone Tab 25 MG: ORAL | 90 days supply | Qty: 90 | Fill #1 | Status: AC

## 2020-09-22 MED FILL — Digoxin Tab 125 MCG (0.125 MG): ORAL | 30 days supply | Qty: 30 | Fill #4 | Status: AC

## 2020-09-28 ENCOUNTER — Telehealth: Payer: Self-pay | Admitting: Family Medicine

## 2020-09-30 ENCOUNTER — Ambulatory Visit: Payer: 59

## 2020-10-08 NOTE — Telephone Encounter (Signed)
Joy pt's sister in law called in stating that pt passed away this morning.

## 2020-10-08 NOTE — Telephone Encounter (Signed)
Thank you for letting me know.  I will reach out to the family

## 2020-10-08 DEATH — deceased

## 2020-11-02 ENCOUNTER — Encounter (HOSPITAL_COMMUNITY): Payer: 59 | Admitting: Internal Medicine

## 2020-11-17 ENCOUNTER — Telehealth (HOSPITAL_COMMUNITY): Payer: Self-pay

## 2020-11-17 NOTE — Telephone Encounter (Signed)
Spoke with funeral home date of death needs to be updated on death certificate.  Adline Potter aware and will follow up with Dr.Bensimhon

## 2020-11-17 NOTE — Telephone Encounter (Signed)
[facility] [employer] [facility] called and needs someone to make a change to the death certificate.
